# Patient Record
Sex: Male | Born: 1950 | Race: White | Hispanic: No | Marital: Single | Smoking: Never smoker
Health system: Southern US, Community
[De-identification: ages and names within clinical notes are randomized; demographics above are authoritative.]

## PROBLEM LIST (undated history)

## (undated) DIAGNOSIS — N529 Male erectile dysfunction, unspecified: Secondary | ICD-10-CM

## (undated) DIAGNOSIS — E039 Hypothyroidism, unspecified: Secondary | ICD-10-CM

## (undated) DIAGNOSIS — E119 Type 2 diabetes mellitus without complications: Secondary | ICD-10-CM

## (undated) DIAGNOSIS — I509 Heart failure, unspecified: Secondary | ICD-10-CM

## (undated) DIAGNOSIS — G629 Polyneuropathy, unspecified: Secondary | ICD-10-CM

## (undated) DIAGNOSIS — F32A Depression, unspecified: Secondary | ICD-10-CM

## (undated) DIAGNOSIS — I739 Peripheral vascular disease, unspecified: Secondary | ICD-10-CM

## (undated) DIAGNOSIS — F419 Anxiety disorder, unspecified: Secondary | ICD-10-CM

## (undated) DIAGNOSIS — E291 Testicular hypofunction: Secondary | ICD-10-CM

## (undated) DIAGNOSIS — K219 Gastro-esophageal reflux disease without esophagitis: Secondary | ICD-10-CM

## (undated) DIAGNOSIS — Z8659 Personal history of other mental and behavioral disorders: Secondary | ICD-10-CM

## (undated) DIAGNOSIS — F329 Major depressive disorder, single episode, unspecified: Secondary | ICD-10-CM

## (undated) DIAGNOSIS — R748 Abnormal levels of other serum enzymes: Secondary | ICD-10-CM

## (undated) DIAGNOSIS — I10 Essential (primary) hypertension: Secondary | ICD-10-CM

## (undated) DIAGNOSIS — G4700 Insomnia, unspecified: Secondary | ICD-10-CM

## (undated) DIAGNOSIS — F319 Bipolar disorder, unspecified: Secondary | ICD-10-CM

## (undated) DIAGNOSIS — E079 Disorder of thyroid, unspecified: Secondary | ICD-10-CM

## (undated) DIAGNOSIS — F39 Unspecified mood [affective] disorder: Secondary | ICD-10-CM

## (undated) DIAGNOSIS — I472 Ventricular tachycardia, unspecified: Secondary | ICD-10-CM

## (undated) DIAGNOSIS — G4733 Obstructive sleep apnea (adult) (pediatric): Secondary | ICD-10-CM

## (undated) DIAGNOSIS — N400 Enlarged prostate without lower urinary tract symptoms: Secondary | ICD-10-CM

## (undated) DIAGNOSIS — E669 Obesity, unspecified: Secondary | ICD-10-CM

## (undated) DIAGNOSIS — I2510 Atherosclerotic heart disease of native coronary artery without angina pectoris: Secondary | ICD-10-CM

## (undated) DIAGNOSIS — M109 Gout, unspecified: Secondary | ICD-10-CM

## (undated) DIAGNOSIS — Z794 Long term (current) use of insulin: Secondary | ICD-10-CM

## (undated) HISTORY — PX: OTHER SURGICAL HISTORY: SHX169

## (undated) HISTORY — DX: Polyneuropathy, unspecified: G62.9

## (undated) HISTORY — DX: Depression, unspecified: F32.A

## (undated) HISTORY — DX: Insomnia, unspecified: G47.00

## (undated) HISTORY — PX: MASTOIDECTOMY: SHX711

## (undated) HISTORY — DX: Personal history of other mental and behavioral disorders: Z86.59

## (undated) HISTORY — DX: Obesity, unspecified: E66.9

## (undated) HISTORY — DX: Benign prostatic hyperplasia without lower urinary tract symptoms: N40.0

## (undated) HISTORY — DX: Male erectile dysfunction, unspecified: N52.9

## (undated) HISTORY — DX: Bipolar disorder, unspecified: F31.9

## (undated) HISTORY — DX: Type 2 diabetes mellitus without complications: E11.9

## (undated) HISTORY — PX: NASAL SEPTUM SURGERY: SHX37

## (undated) HISTORY — DX: Major depressive disorder, single episode, unspecified: F32.9

## (undated) HISTORY — PX: SCROTOPLASTY: SHX489

## (undated) HISTORY — DX: Peripheral vascular disease, unspecified: I73.9

## (undated) HISTORY — PX: TONSILLECTOMY AND ADENOIDECTOMY: SUR1326

## (undated) HISTORY — DX: Anxiety disorder, unspecified: F41.9

## (undated) HISTORY — DX: Disorder of thyroid, unspecified: E07.9

## (undated) HISTORY — PX: CORONARY ANGIOPLASTY WITH STENT PLACEMENT: SHX49

## (undated) HISTORY — DX: Abnormal levels of other serum enzymes: R74.8

## (undated) HISTORY — DX: Gastro-esophageal reflux disease without esophagitis: K21.9

## (undated) HISTORY — DX: Testicular hypofunction: E29.1

## (undated) HISTORY — PX: PENILE PROSTHESIS IMPLANT: SHX240

## (undated) HISTORY — PX: FOOT SURGERY: SHX648

---

## 2003-02-18 ENCOUNTER — Other Ambulatory Visit: Payer: Self-pay

## 2004-04-05 ENCOUNTER — Observation Stay: Payer: Self-pay

## 2004-04-05 ENCOUNTER — Other Ambulatory Visit: Payer: Self-pay

## 2004-09-30 ENCOUNTER — Other Ambulatory Visit: Payer: Self-pay

## 2004-09-30 ENCOUNTER — Inpatient Hospital Stay: Payer: Self-pay | Admitting: Cardiology

## 2004-10-01 ENCOUNTER — Other Ambulatory Visit: Payer: Self-pay

## 2004-12-23 ENCOUNTER — Other Ambulatory Visit: Payer: Self-pay

## 2004-12-23 ENCOUNTER — Inpatient Hospital Stay: Payer: Self-pay | Admitting: Cardiology

## 2005-02-04 ENCOUNTER — Emergency Department: Payer: Self-pay | Admitting: Emergency Medicine

## 2005-09-06 ENCOUNTER — Emergency Department: Payer: Self-pay | Admitting: Emergency Medicine

## 2005-11-23 ENCOUNTER — Other Ambulatory Visit: Payer: Self-pay

## 2005-11-23 ENCOUNTER — Emergency Department: Payer: Self-pay | Admitting: Emergency Medicine

## 2005-11-28 ENCOUNTER — Other Ambulatory Visit: Payer: Self-pay

## 2005-11-28 ENCOUNTER — Emergency Department: Payer: Self-pay | Admitting: Internal Medicine

## 2005-12-11 ENCOUNTER — Inpatient Hospital Stay: Payer: Self-pay | Admitting: Internal Medicine

## 2005-12-11 ENCOUNTER — Other Ambulatory Visit: Payer: Self-pay

## 2006-01-09 ENCOUNTER — Emergency Department: Payer: Self-pay | Admitting: Emergency Medicine

## 2006-02-03 ENCOUNTER — Emergency Department: Payer: Self-pay

## 2006-02-12 ENCOUNTER — Emergency Department: Payer: Self-pay | Admitting: Emergency Medicine

## 2006-03-07 ENCOUNTER — Other Ambulatory Visit: Payer: Self-pay

## 2006-03-07 ENCOUNTER — Inpatient Hospital Stay: Payer: Self-pay | Admitting: Internal Medicine

## 2006-03-30 ENCOUNTER — Emergency Department: Payer: Self-pay | Admitting: Emergency Medicine

## 2006-04-05 ENCOUNTER — Emergency Department: Payer: Self-pay | Admitting: General Practice

## 2006-05-06 ENCOUNTER — Emergency Department: Payer: Self-pay | Admitting: Unknown Physician Specialty

## 2006-06-16 ENCOUNTER — Other Ambulatory Visit: Payer: Self-pay

## 2006-06-16 ENCOUNTER — Inpatient Hospital Stay: Payer: Self-pay | Admitting: Internal Medicine

## 2006-06-17 ENCOUNTER — Other Ambulatory Visit: Payer: Self-pay

## 2006-06-19 ENCOUNTER — Inpatient Hospital Stay (HOSPITAL_COMMUNITY): Admission: AD | Admit: 2006-06-19 | Discharge: 2006-06-22 | Payer: Self-pay | Admitting: Psychiatry

## 2006-06-20 ENCOUNTER — Ambulatory Visit: Payer: Self-pay | Admitting: Psychiatry

## 2006-06-27 ENCOUNTER — Emergency Department: Payer: Self-pay | Admitting: Emergency Medicine

## 2006-06-27 ENCOUNTER — Other Ambulatory Visit: Payer: Self-pay

## 2006-07-18 ENCOUNTER — Emergency Department: Payer: Self-pay | Admitting: Emergency Medicine

## 2006-07-30 ENCOUNTER — Other Ambulatory Visit: Payer: Self-pay

## 2006-07-30 ENCOUNTER — Inpatient Hospital Stay: Payer: Self-pay | Admitting: *Deleted

## 2006-10-06 ENCOUNTER — Inpatient Hospital Stay: Payer: Self-pay | Admitting: Internal Medicine

## 2006-10-06 ENCOUNTER — Other Ambulatory Visit: Payer: Self-pay

## 2006-10-08 ENCOUNTER — Inpatient Hospital Stay: Payer: Self-pay | Admitting: Unknown Physician Specialty

## 2007-10-11 ENCOUNTER — Emergency Department: Payer: Self-pay | Admitting: Emergency Medicine

## 2007-12-19 ENCOUNTER — Emergency Department: Payer: Self-pay | Admitting: Emergency Medicine

## 2008-02-04 ENCOUNTER — Ambulatory Visit: Payer: Self-pay | Admitting: Nephrology

## 2008-02-11 ENCOUNTER — Ambulatory Visit: Payer: Self-pay | Admitting: Podiatry

## 2008-08-21 ENCOUNTER — Emergency Department: Payer: Self-pay | Admitting: Unknown Physician Specialty

## 2008-09-23 ENCOUNTER — Inpatient Hospital Stay: Payer: Self-pay | Admitting: Internal Medicine

## 2008-09-24 ENCOUNTER — Inpatient Hospital Stay: Payer: Self-pay | Admitting: Psychiatry

## 2008-12-30 ENCOUNTER — Ambulatory Visit: Payer: Self-pay | Admitting: Internal Medicine

## 2009-01-31 ENCOUNTER — Observation Stay: Payer: Self-pay | Admitting: Internal Medicine

## 2009-02-24 ENCOUNTER — Ambulatory Visit: Payer: Self-pay | Admitting: Unknown Physician Specialty

## 2009-03-02 ENCOUNTER — Ambulatory Visit: Payer: Self-pay | Admitting: Internal Medicine

## 2009-03-17 ENCOUNTER — Ambulatory Visit: Payer: Self-pay | Admitting: Internal Medicine

## 2009-08-25 ENCOUNTER — Emergency Department: Payer: Self-pay

## 2009-12-17 ENCOUNTER — Inpatient Hospital Stay: Payer: Self-pay | Admitting: Psychiatry

## 2009-12-17 ENCOUNTER — Inpatient Hospital Stay: Payer: Self-pay | Admitting: Internal Medicine

## 2010-05-18 ENCOUNTER — Emergency Department: Payer: Self-pay | Admitting: Emergency Medicine

## 2012-01-12 ENCOUNTER — Emergency Department: Payer: Self-pay | Admitting: Emergency Medicine

## 2012-01-12 LAB — PROTIME-INR: INR: 0.9

## 2012-01-12 LAB — CBC WITH DIFFERENTIAL/PLATELET
Basophil #: 0.1 10*3/uL (ref 0.0–0.1)
HCT: 41.4 % (ref 40.0–52.0)
HGB: 13.9 g/dL (ref 13.0–18.0)
Lymphocyte #: 2.6 10*3/uL (ref 1.0–3.6)
Lymphocyte %: 34.4 %
MCHC: 33.7 g/dL (ref 32.0–36.0)
Monocyte #: 1 x10 3/mm (ref 0.2–1.0)
Monocyte %: 13.1 %
Neutrophil %: 47.6 %
Platelet: 190 10*3/uL (ref 150–440)
RDW: 14.9 % — ABNORMAL HIGH (ref 11.5–14.5)
WBC: 7.5 10*3/uL (ref 3.8–10.6)

## 2012-01-12 LAB — COMPREHENSIVE METABOLIC PANEL
Alkaline Phosphatase: 85 U/L (ref 50–136)
Anion Gap: 11 (ref 7–16)
Bilirubin,Total: 0.5 mg/dL (ref 0.2–1.0)
Chloride: 101 mmol/L (ref 98–107)
Creatinine: 1.16 mg/dL (ref 0.60–1.30)
EGFR (African American): >60
Glucose: 108 mg/dL — ABNORMAL HIGH (ref 65–99)
Osmolality: 284 (ref 275–301)
Potassium: 3.9 mmol/L (ref 3.5–5.1)
SGOT(AST): 29 U/L (ref 15–37)
Sodium: 140 mmol/L (ref 136–145)
Total Protein: 7.8 g/dL (ref 6.4–8.2)

## 2012-01-12 LAB — URINALYSIS, COMPLETE
Bacteria: NONE SEEN
Ketone: NEGATIVE
Nitrite: NEGATIVE
Protein: 30
Specific Gravity: 1.013 (ref 1.003–1.030)
Squamous Epithelial: NONE SEEN

## 2012-01-12 LAB — APTT: Activated PTT: 29.8 secs (ref 23.6–35.9)

## 2012-01-12 LAB — TROPONIN I
Troponin-I: <0.02 ng/mL
Troponin-I: <0.02 ng/mL

## 2012-01-30 ENCOUNTER — Ambulatory Visit: Payer: Self-pay | Admitting: Unknown Physician Specialty

## 2012-06-04 ENCOUNTER — Ambulatory Visit: Payer: Self-pay | Admitting: Physician Assistant

## 2012-06-15 ENCOUNTER — Ambulatory Visit: Payer: Self-pay | Admitting: Physician Assistant

## 2012-07-16 ENCOUNTER — Ambulatory Visit: Payer: Self-pay | Admitting: Physician Assistant

## 2012-07-23 ENCOUNTER — Ambulatory Visit: Payer: Self-pay | Admitting: Urology

## 2012-07-23 LAB — CBC WITH DIFFERENTIAL/PLATELET
Basophil #: 0.1 10*3/uL (ref 0.0–0.1)
HCT: 40.9 % (ref 40.0–52.0)
HGB: 13.5 g/dL (ref 13.0–18.0)
Lymphocyte #: 1.8 10*3/uL (ref 1.0–3.6)
Lymphocyte %: 23.1 %
MCH: 30 pg (ref 26.0–34.0)
Monocyte %: 11.5 %
Neutrophil #: 4.6 10*3/uL (ref 1.4–6.5)
Platelet: 180 10*3/uL (ref 150–440)
RBC: 4.5 10*6/uL (ref 4.40–5.90)
RDW: 16 % — ABNORMAL HIGH (ref 11.5–14.5)

## 2012-07-23 LAB — BASIC METABOLIC PANEL
Anion Gap: 4 — ABNORMAL LOW (ref 7–16)
BUN: 18 mg/dL (ref 7–18)
Chloride: 104 mmol/L (ref 98–107)
Creatinine: 1.11 mg/dL (ref 0.60–1.30)
EGFR (African American): >60
EGFR (Non-African Amer.): >60
Glucose: 189 mg/dL — ABNORMAL HIGH (ref 65–99)
Osmolality: 284 (ref 275–301)
Potassium: 4.7 mmol/L (ref 3.5–5.1)
Sodium: 139 mmol/L (ref 136–145)

## 2012-07-29 ENCOUNTER — Ambulatory Visit: Payer: Self-pay | Admitting: Urology

## 2012-11-19 ENCOUNTER — Emergency Department: Payer: Self-pay | Admitting: Emergency Medicine

## 2012-11-19 LAB — CBC
HCT: 40.3 % (ref 40.0–52.0)
HGB: 13.5 g/dL (ref 13.0–18.0)
MCH: 29.8 pg (ref 26.0–34.0)
MCHC: 33.5 g/dL (ref 32.0–36.0)
MCV: 89 fL (ref 80–100)
RBC: 4.54 10*6/uL (ref 4.40–5.90)
RDW: 15.4 % — ABNORMAL HIGH (ref 11.5–14.5)

## 2012-11-19 LAB — URINALYSIS, COMPLETE
Bacteria: NONE SEEN
Bilirubin,UR: NEGATIVE
Glucose,UR: >=500 mg/dL (ref 0–75)
Protein: NEGATIVE
RBC,UR: 1 /HPF (ref 0–5)
Specific Gravity: 1.021 (ref 1.003–1.030)
Squamous Epithelial: NONE SEEN
WBC UR: <1 /HPF (ref 0–5)

## 2012-11-19 LAB — COMPREHENSIVE METABOLIC PANEL
Albumin: 3 g/dL — ABNORMAL LOW (ref 3.4–5.0)
Alkaline Phosphatase: 119 U/L (ref 50–136)
Anion Gap: 6 — ABNORMAL LOW (ref 7–16)
Bilirubin,Total: 0.4 mg/dL (ref 0.2–1.0)
Calcium, Total: 8.9 mg/dL (ref 8.5–10.1)
Co2: 28 mmol/L (ref 21–32)
Creatinine: 1.2 mg/dL (ref 0.60–1.30)
Glucose: 461 mg/dL — ABNORMAL HIGH (ref 65–99)
Osmolality: 283 (ref 275–301)
Potassium: 4.8 mmol/L (ref 3.5–5.1)
SGOT(AST): 25 U/L (ref 15–37)
SGPT (ALT): 33 U/L (ref 12–78)
Sodium: 129 mmol/L — ABNORMAL LOW (ref 136–145)
Total Protein: 7.5 g/dL (ref 6.4–8.2)

## 2012-11-21 ENCOUNTER — Ambulatory Visit: Payer: Self-pay | Admitting: Physician Assistant

## 2012-11-24 ENCOUNTER — Observation Stay: Payer: Self-pay | Admitting: Internal Medicine

## 2012-11-24 LAB — BASIC METABOLIC PANEL
Anion Gap: 7 (ref 7–16)
Calcium, Total: 8.4 mg/dL — ABNORMAL LOW (ref 8.5–10.1)
Chloride: 101 mmol/L (ref 98–107)
Co2: 27 mmol/L (ref 21–32)
Creatinine: 1.26 mg/dL (ref 0.60–1.30)
EGFR (African American): >60
EGFR (Non-African Amer.): >60
Osmolality: 291 (ref 275–301)
Sodium: 135 mmol/L — ABNORMAL LOW (ref 136–145)

## 2012-11-24 LAB — CBC
HCT: 37.9 % — ABNORMAL LOW (ref 40.0–52.0)
HGB: 12.7 g/dL — ABNORMAL LOW (ref 13.0–18.0)
MCHC: 33.5 g/dL (ref 32.0–36.0)
Platelet: 165 10*3/uL (ref 150–440)
WBC: 7.5 10*3/uL (ref 3.8–10.6)

## 2012-11-24 LAB — CK TOTAL AND CKMB (NOT AT ARMC)
CK, Total: 597 U/L — ABNORMAL HIGH (ref 35–232)
CK-MB: 21.6 ng/mL — ABNORMAL HIGH (ref 0.5–3.6)

## 2012-11-24 LAB — TROPONIN I
Troponin-I: 0.04 ng/mL
Troponin-I: 0.04 ng/mL

## 2012-12-03 ENCOUNTER — Ambulatory Visit: Payer: Self-pay | Admitting: Internal Medicine

## 2013-01-05 ENCOUNTER — Emergency Department: Payer: Self-pay | Admitting: Emergency Medicine

## 2013-01-05 LAB — CBC
MCH: 32.1 pg (ref 26.0–34.0)
MCHC: 36.3 g/dL — ABNORMAL HIGH (ref 32.0–36.0)
MCV: 88 fL (ref 80–100)
Platelet: 258 10*3/uL (ref 150–440)
RDW: 16.1 % — ABNORMAL HIGH (ref 11.5–14.5)
WBC: 7.2 10*3/uL (ref 3.8–10.6)

## 2013-01-05 LAB — COMPREHENSIVE METABOLIC PANEL
Albumin: 3.3 g/dL — ABNORMAL LOW (ref 3.4–5.0)
Alkaline Phosphatase: 138 U/L — ABNORMAL HIGH (ref 50–136)
Anion Gap: 12 (ref 7–16)
BUN: 28 mg/dL — ABNORMAL HIGH (ref 7–18)
Bilirubin,Total: 0.7 mg/dL (ref 0.2–1.0)
Calcium, Total: 9.5 mg/dL (ref 8.5–10.1)
Chloride: 92 mmol/L — ABNORMAL LOW (ref 98–107)
Creatinine: 1.27 mg/dL (ref 0.60–1.30)
EGFR (African American): >60
Glucose: 560 mg/dL (ref 65–99)
Osmolality: 284 (ref 275–301)
SGPT (ALT): 58 U/L (ref 12–78)
Sodium: 126 mmol/L — ABNORMAL LOW (ref 136–145)
Total Protein: 8 g/dL (ref 6.4–8.2)

## 2013-01-17 ENCOUNTER — Emergency Department: Payer: Self-pay | Admitting: Emergency Medicine

## 2013-01-17 LAB — CBC
HCT: 38.8 % — ABNORMAL LOW (ref 40.0–52.0)
HGB: 13.5 g/dL (ref 13.0–18.0)
MCH: 30.7 pg (ref 26.0–34.0)
MCHC: 34.8 g/dL (ref 32.0–36.0)
MCV: 88 fL (ref 80–100)
Platelet: 189 10*3/uL (ref 150–440)
RBC: 4.4 10*6/uL (ref 4.40–5.90)

## 2013-01-17 LAB — BASIC METABOLIC PANEL
Anion Gap: 8 (ref 7–16)
BUN: 22 mg/dL — ABNORMAL HIGH (ref 7–18)
Calcium, Total: 8.7 mg/dL (ref 8.5–10.1)
Chloride: 99 mmol/L (ref 98–107)
Co2: 26 mmol/L (ref 21–32)
Creatinine: 1.22 mg/dL (ref 0.60–1.30)
EGFR (African American): >60
EGFR (Non-African Amer.): >60
Glucose: 302 mg/dL — ABNORMAL HIGH (ref 65–99)
Osmolality: 281 (ref 275–301)
Potassium: 5 mmol/L (ref 3.5–5.1)
Sodium: 133 mmol/L — ABNORMAL LOW (ref 136–145)

## 2013-01-17 LAB — APTT: Activated PTT: 29.9 secs (ref 23.6–35.9)

## 2013-01-17 LAB — PROTIME-INR: Prothrombin Time: 12.6 secs (ref 11.5–14.7)

## 2013-01-17 LAB — TROPONIN I: Troponin-I: <0.02 ng/mL

## 2013-07-10 ENCOUNTER — Inpatient Hospital Stay: Payer: Self-pay | Admitting: Internal Medicine

## 2013-07-10 LAB — URINALYSIS, COMPLETE
BILIRUBIN, UR: NEGATIVE
Bacteria: NONE SEEN
KETONE: NEGATIVE
Leukocyte Esterase: NEGATIVE
Nitrite: NEGATIVE
PH: 6 (ref 4.5–8.0)
RBC,UR: 1 /HPF (ref 0–5)
SQUAMOUS EPITHELIAL: NONE SEEN
Specific Gravity: 1.008 (ref 1.003–1.030)
WBC UR: NONE SEEN /HPF (ref 0–5)

## 2013-07-10 LAB — DRUG SCREEN, URINE

## 2013-07-10 LAB — VALPROIC ACID LEVEL: VALPROIC ACID: 62 ug/mL

## 2013-07-10 LAB — COMPREHENSIVE METABOLIC PANEL
ALK PHOS: 72 U/L
ALT: 71 U/L (ref 12–78)
AST: 71 U/L — AB (ref 15–37)
Albumin: 3.7 g/dL (ref 3.4–5.0)
Anion Gap: 6 — ABNORMAL LOW (ref 7–16)
BILIRUBIN TOTAL: 0.6 mg/dL (ref 0.2–1.0)
BUN: 22 mg/dL — AB (ref 7–18)
CHLORIDE: 101 mmol/L (ref 98–107)
CO2: 29 mmol/L (ref 21–32)
Calcium, Total: 8.9 mg/dL (ref 8.5–10.1)
Creatinine: 1.34 mg/dL — ABNORMAL HIGH (ref 0.60–1.30)
EGFR (African American): >60
EGFR (Non-African Amer.): 57 — ABNORMAL LOW
Glucose: 56 mg/dL — ABNORMAL LOW (ref 65–99)
Osmolality: 273 (ref 275–301)
POTASSIUM: 3.5 mmol/L (ref 3.5–5.1)
SODIUM: 136 mmol/L (ref 136–145)
TOTAL PROTEIN: 8.2 g/dL (ref 6.4–8.2)

## 2013-07-10 LAB — CBC
HCT: 43.3 % (ref 40.0–52.0)
HGB: 14.7 g/dL (ref 13.0–18.0)
MCH: 30.9 pg (ref 26.0–34.0)
MCHC: 33.9 g/dL (ref 32.0–36.0)
MCV: 91 fL (ref 80–100)
Platelet: 222 10*3/uL (ref 150–440)
RBC: 4.75 10*6/uL (ref 4.40–5.90)
RDW: 16.1 % — ABNORMAL HIGH (ref 11.5–14.5)
WBC: 9.5 10*3/uL (ref 3.8–10.6)

## 2013-07-10 LAB — ETHANOL
Ethanol %: <0.003 % (ref 0.000–0.080)
Ethanol: <3 mg/dL

## 2013-07-10 LAB — SALICYLATE LEVEL

## 2013-07-10 LAB — ACETAMINOPHEN LEVEL: Acetaminophen: <2 ug/mL

## 2013-07-11 ENCOUNTER — Inpatient Hospital Stay: Payer: Self-pay | Admitting: Psychiatry

## 2013-07-11 LAB — CBC WITH DIFFERENTIAL/PLATELET
BASOS ABS: 0.1 10*3/uL (ref 0.0–0.1)
BASOS PCT: 0.6 %
Eosinophil #: 0.5 10*3/uL (ref 0.0–0.7)
Eosinophil %: 5.4 %
HCT: 46.2 % (ref 40.0–52.0)
HGB: 15.1 g/dL (ref 13.0–18.0)
Lymphocyte #: 2.7 10*3/uL (ref 1.0–3.6)
Lymphocyte %: 27.4 %
MCH: 30 pg (ref 26.0–34.0)
MCHC: 32.8 g/dL (ref 32.0–36.0)
MCV: 91 fL (ref 80–100)
MONO ABS: 1.1 x10 3/mm — AB (ref 0.2–1.0)
Monocyte %: 10.7 %
Neutrophil #: 5.5 10*3/uL (ref 1.4–6.5)
Neutrophil %: 55.9 %
PLATELETS: 178 10*3/uL (ref 150–440)
RBC: 5.05 10*6/uL (ref 4.40–5.90)
RDW: 16.1 % — ABNORMAL HIGH (ref 11.5–14.5)
WBC: 9.9 10*3/uL (ref 3.8–10.6)

## 2013-07-11 LAB — BASIC METABOLIC PANEL
ANION GAP: 5 — AB (ref 7–16)
BUN: 23 mg/dL — AB (ref 7–18)
CHLORIDE: 102 mmol/L (ref 98–107)
Calcium, Total: 9.2 mg/dL (ref 8.5–10.1)
Co2: 27 mmol/L (ref 21–32)
Creatinine: 1.23 mg/dL (ref 0.60–1.30)
EGFR (Non-African Amer.): >60
Glucose: 157 mg/dL — ABNORMAL HIGH (ref 65–99)
Osmolality: 275 (ref 275–301)
POTASSIUM: 4 mmol/L (ref 3.5–5.1)
SODIUM: 134 mmol/L — AB (ref 136–145)

## 2013-08-13 LAB — BASIC METABOLIC PANEL
Anion Gap: 10 (ref 7–16)
BUN: 21 mg/dL — ABNORMAL HIGH (ref 7–18)
CHLORIDE: 102 mmol/L (ref 98–107)
CO2: 23 mmol/L (ref 21–32)
Calcium, Total: 9.5 mg/dL (ref 8.5–10.1)
Creatinine: 1.52 mg/dL — ABNORMAL HIGH (ref 0.60–1.30)
EGFR (African American): 57 — ABNORMAL LOW
EGFR (Non-African Amer.): 49 — ABNORMAL LOW
Glucose: 204 mg/dL — ABNORMAL HIGH (ref 65–99)
OSMOLALITY: 279 (ref 275–301)
Potassium: 4.2 mmol/L (ref 3.5–5.1)
Sodium: 135 mmol/L — ABNORMAL LOW (ref 136–145)

## 2013-08-13 LAB — CBC
HCT: 45.1 % (ref 40.0–52.0)
HGB: 14.4 g/dL (ref 13.0–18.0)
MCH: 29.4 pg (ref 26.0–34.0)
MCHC: 31.8 g/dL — ABNORMAL LOW (ref 32.0–36.0)
MCV: 92 fL (ref 80–100)
Platelet: 210 10*3/uL (ref 150–440)
RBC: 4.88 10*6/uL (ref 4.40–5.90)
RDW: 16.4 % — AB (ref 11.5–14.5)
WBC: 7.4 10*3/uL (ref 3.8–10.6)

## 2013-08-13 LAB — TROPONIN I
TROPONIN-I: 0.39 ng/mL — AB
Troponin-I: 0.03 ng/mL

## 2013-08-13 LAB — CK-MB: CK-MB: 19.2 ng/mL — AB (ref 0.5–3.6)

## 2013-08-14 ENCOUNTER — Inpatient Hospital Stay: Payer: Self-pay | Admitting: Internal Medicine

## 2013-08-14 LAB — BASIC METABOLIC PANEL
ANION GAP: 10 (ref 7–16)
BUN: 24 mg/dL — ABNORMAL HIGH (ref 7–18)
CO2: 24 mmol/L (ref 21–32)
CREATININE: 1.4 mg/dL — AB (ref 0.60–1.30)
Calcium, Total: 9.2 mg/dL (ref 8.5–10.1)
Chloride: 106 mmol/L (ref 98–107)
EGFR (African American): >60
EGFR (Non-African Amer.): 54 — ABNORMAL LOW
Glucose: 188 mg/dL — ABNORMAL HIGH (ref 65–99)
OSMOLALITY: 288 (ref 275–301)
Potassium: 4.1 mmol/L (ref 3.5–5.1)
Sodium: 140 mmol/L (ref 136–145)

## 2013-08-14 LAB — TROPONIN I: TROPONIN-I: 0.6 ng/mL — AB

## 2013-08-14 LAB — CBC WITH DIFFERENTIAL/PLATELET
Basophil #: 0.1 10*3/uL (ref 0.0–0.1)
Basophil %: 0.8 %
EOS ABS: 0.4 10*3/uL (ref 0.0–0.7)
Eosinophil %: 5.5 %
HCT: 40.9 % (ref 40.0–52.0)
HGB: 13.2 g/dL (ref 13.0–18.0)
LYMPHS ABS: 2.5 10*3/uL (ref 1.0–3.6)
LYMPHS PCT: 33.9 %
MCH: 29.6 pg (ref 26.0–34.0)
MCHC: 32.3 g/dL (ref 32.0–36.0)
MCV: 91 fL (ref 80–100)
MONOS PCT: 13 %
Monocyte #: 1 x10 3/mm (ref 0.2–1.0)
NEUTROS ABS: 3.4 10*3/uL (ref 1.4–6.5)
Neutrophil %: 46.8 %
Platelet: 209 10*3/uL (ref 150–440)
RBC: 4.47 10*6/uL (ref 4.40–5.90)
RDW: 16.3 % — AB (ref 11.5–14.5)
WBC: 7.4 10*3/uL (ref 3.8–10.6)

## 2013-08-14 LAB — LIPID PANEL
Cholesterol: 211 mg/dL — ABNORMAL HIGH (ref 0–200)
HDL: 24 mg/dL — AB (ref 40–60)
Triglycerides: 530 mg/dL — ABNORMAL HIGH (ref 0–200)

## 2013-08-14 LAB — PROTIME-INR
INR: 1.1
PROTHROMBIN TIME: 13.6 s (ref 11.5–14.7)

## 2013-08-14 LAB — CK-MB
CK-MB: 15.9 ng/mL — ABNORMAL HIGH (ref 0.5–3.6)
CK-MB: 14.2 ng/mL — ABNORMAL HIGH (ref 0.5–3.6)

## 2013-08-15 LAB — CK TOTAL AND CKMB (NOT AT ARMC)
CK, TOTAL: 249 U/L
CK-MB: 7.8 ng/mL — ABNORMAL HIGH (ref 0.5–3.6)

## 2013-08-15 LAB — BASIC METABOLIC PANEL
Anion Gap: 10 (ref 7–16)
BUN: 17 mg/dL (ref 7–18)
CHLORIDE: 103 mmol/L (ref 98–107)
Calcium, Total: 8.8 mg/dL (ref 8.5–10.1)
Co2: 25 mmol/L (ref 21–32)
Creatinine: 0.92 mg/dL (ref 0.60–1.30)
EGFR (Non-African Amer.): >60
Glucose: 173 mg/dL — ABNORMAL HIGH (ref 65–99)
OSMOLALITY: 281 (ref 275–301)
Potassium: 4 mmol/L (ref 3.5–5.1)
Sodium: 138 mmol/L (ref 136–145)

## 2013-09-09 ENCOUNTER — Emergency Department: Payer: Self-pay | Admitting: Emergency Medicine

## 2013-09-26 DIAGNOSIS — I214 Non-ST elevation (NSTEMI) myocardial infarction: Secondary | ICD-10-CM | POA: Insufficient documentation

## 2013-09-26 DIAGNOSIS — E119 Type 2 diabetes mellitus without complications: Secondary | ICD-10-CM | POA: Insufficient documentation

## 2013-09-26 DIAGNOSIS — I2510 Atherosclerotic heart disease of native coronary artery without angina pectoris: Secondary | ICD-10-CM | POA: Insufficient documentation

## 2013-09-29 ENCOUNTER — Inpatient Hospital Stay: Payer: Self-pay | Admitting: Internal Medicine

## 2013-09-29 LAB — URINALYSIS, COMPLETE
BACTERIA: NONE SEEN
BILIRUBIN, UR: NEGATIVE
Glucose,UR: >=500 mg/dL (ref 0–75)
Ketone: NEGATIVE
Leukocyte Esterase: NEGATIVE
Nitrite: NEGATIVE
Ph: 6 (ref 4.5–8.0)
Specific Gravity: 1.018 (ref 1.003–1.030)
Squamous Epithelial: <1
WBC UR: NONE SEEN /HPF (ref 0–5)

## 2013-09-29 LAB — COMPREHENSIVE METABOLIC PANEL
ALBUMIN: 3.3 g/dL — AB (ref 3.4–5.0)
ALK PHOS: 99 U/L
ALT: 43 U/L (ref 12–78)
Anion Gap: 7 (ref 7–16)
BILIRUBIN TOTAL: 0.5 mg/dL (ref 0.2–1.0)
BUN: 31 mg/dL — ABNORMAL HIGH (ref 7–18)
CALCIUM: 9.2 mg/dL (ref 8.5–10.1)
Chloride: 94 mmol/L — ABNORMAL LOW (ref 98–107)
Co2: 28 mmol/L (ref 21–32)
Creatinine: 1.35 mg/dL — ABNORMAL HIGH (ref 0.60–1.30)
EGFR (African American): >60
EGFR (Non-African Amer.): 57 — ABNORMAL LOW
Glucose: 346 mg/dL — ABNORMAL HIGH (ref 65–99)
Osmolality: 279 (ref 275–301)
Potassium: 4.7 mmol/L (ref 3.5–5.1)
SGOT(AST): 36 U/L (ref 15–37)
Sodium: 129 mmol/L — ABNORMAL LOW (ref 136–145)
TOTAL PROTEIN: 7.9 g/dL (ref 6.4–8.2)

## 2013-09-29 LAB — CBC
HCT: 41.9 % (ref 40.0–52.0)
HGB: 13.4 g/dL (ref 13.0–18.0)
MCH: 29.4 pg (ref 26.0–34.0)
MCHC: 32 g/dL (ref 32.0–36.0)
MCV: 92 fL (ref 80–100)
Platelet: 219 10*3/uL (ref 150–440)
RBC: 4.57 10*6/uL (ref 4.40–5.90)
RDW: 15.7 % — ABNORMAL HIGH (ref 11.5–14.5)
WBC: 12.8 10*3/uL — AB (ref 3.8–10.6)

## 2013-09-29 LAB — LIPID PANEL
Cholesterol: 299 mg/dL — ABNORMAL HIGH (ref 0–200)
HDL Cholesterol: 26 mg/dL — ABNORMAL LOW (ref 40–60)
Triglycerides: 917 mg/dL — ABNORMAL HIGH (ref 0–200)

## 2013-09-29 LAB — LIPASE, BLOOD: Lipase: 694 U/L — ABNORMAL HIGH (ref 73–393)

## 2013-09-30 LAB — BASIC METABOLIC PANEL
Anion Gap: 3 — ABNORMAL LOW (ref 7–16)
BUN: 18 mg/dL (ref 7–18)
CALCIUM: 8.8 mg/dL (ref 8.5–10.1)
CHLORIDE: 95 mmol/L — AB (ref 98–107)
Co2: 32 mmol/L (ref 21–32)
Creatinine: 1.14 mg/dL (ref 0.60–1.30)
GLUCOSE: 185 mg/dL — AB (ref 65–99)
Osmolality: 268 (ref 275–301)
Potassium: 4.1 mmol/L (ref 3.5–5.1)
Sodium: 130 mmol/L — ABNORMAL LOW (ref 136–145)

## 2013-09-30 LAB — CBC WITH DIFFERENTIAL/PLATELET
BASOS ABS: 0.1 10*3/uL (ref 0.0–0.1)
BASOS PCT: 0.5 %
EOS PCT: 0.7 %
Eosinophil #: 0.1 10*3/uL (ref 0.0–0.7)
HCT: 39.4 % — AB (ref 40.0–52.0)
HGB: 13 g/dL (ref 13.0–18.0)
LYMPHS PCT: 14 %
Lymphocyte #: 1.9 10*3/uL (ref 1.0–3.6)
MCH: 29.9 pg (ref 26.0–34.0)
MCHC: 32.9 g/dL (ref 32.0–36.0)
MCV: 91 fL (ref 80–100)
MONO ABS: 1.9 x10 3/mm — AB (ref 0.2–1.0)
Monocyte %: 14.2 %
Neutrophil #: 9.7 10*3/uL — ABNORMAL HIGH (ref 1.4–6.5)
Neutrophil %: 70.6 %
Platelet: 189 10*3/uL (ref 150–440)
RBC: 4.34 10*6/uL — AB (ref 4.40–5.90)
RDW: 15.7 % — ABNORMAL HIGH (ref 11.5–14.5)
WBC: 13.7 10*3/uL — AB (ref 3.8–10.6)

## 2013-09-30 LAB — MAGNESIUM: Magnesium: 1.8 mg/dL

## 2013-09-30 LAB — LIPASE, BLOOD: Lipase: 529 U/L — ABNORMAL HIGH (ref 73–393)

## 2013-10-01 LAB — BASIC METABOLIC PANEL
Anion Gap: 6 — ABNORMAL LOW (ref 7–16)
BUN: 14 mg/dL (ref 7–18)
CALCIUM: 9.6 mg/dL (ref 8.5–10.1)
CHLORIDE: 98 mmol/L (ref 98–107)
Co2: 29 mmol/L (ref 21–32)
Creatinine: 0.92 mg/dL (ref 0.60–1.30)
GLUCOSE: 173 mg/dL — AB (ref 65–99)
OSMOLALITY: 271 (ref 275–301)
POTASSIUM: 4.3 mmol/L (ref 3.5–5.1)
SODIUM: 133 mmol/L — AB (ref 136–145)

## 2013-10-01 LAB — LIPASE, BLOOD: Lipase: 2055 U/L — ABNORMAL HIGH (ref 73–393)

## 2013-11-18 DIAGNOSIS — K859 Acute pancreatitis without necrosis or infection, unspecified: Secondary | ICD-10-CM | POA: Insufficient documentation

## 2013-11-18 DIAGNOSIS — E782 Mixed hyperlipidemia: Secondary | ICD-10-CM | POA: Insufficient documentation

## 2013-11-28 ENCOUNTER — Ambulatory Visit: Payer: Self-pay | Admitting: Gastroenterology

## 2013-11-28 LAB — CREATININE, SERUM
Creatinine: 1.19 mg/dL (ref 0.60–1.30)
EGFR (African American): >60
EGFR (Non-African Amer.): >60

## 2013-12-23 DIAGNOSIS — E1149 Type 2 diabetes mellitus with other diabetic neurological complication: Secondary | ICD-10-CM | POA: Insufficient documentation

## 2013-12-23 DIAGNOSIS — E1159 Type 2 diabetes mellitus with other circulatory complications: Secondary | ICD-10-CM

## 2013-12-23 DIAGNOSIS — Z794 Long term (current) use of insulin: Secondary | ICD-10-CM | POA: Insufficient documentation

## 2013-12-23 DIAGNOSIS — I10 Essential (primary) hypertension: Secondary | ICD-10-CM

## 2013-12-23 DIAGNOSIS — E889 Metabolic disorder, unspecified: Secondary | ICD-10-CM | POA: Insufficient documentation

## 2013-12-23 DIAGNOSIS — E1169 Type 2 diabetes mellitus with other specified complication: Secondary | ICD-10-CM

## 2013-12-23 DIAGNOSIS — E669 Obesity, unspecified: Secondary | ICD-10-CM | POA: Insufficient documentation

## 2014-01-06 DIAGNOSIS — E669 Obesity, unspecified: Secondary | ICD-10-CM | POA: Insufficient documentation

## 2014-01-26 ENCOUNTER — Inpatient Hospital Stay: Payer: Self-pay | Admitting: Psychiatry

## 2014-01-26 LAB — COMPREHENSIVE METABOLIC PANEL
ALBUMIN: 3.6 g/dL (ref 3.4–5.0)
ALK PHOS: 97 U/L
Anion Gap: 7 (ref 7–16)
BUN: 20 mg/dL — ABNORMAL HIGH (ref 7–18)
Bilirubin,Total: 0.2 mg/dL (ref 0.2–1.0)
Calcium, Total: 8.7 mg/dL (ref 8.5–10.1)
Chloride: 103 mmol/L (ref 98–107)
Co2: 29 mmol/L (ref 21–32)
Creatinine: 1.36 mg/dL — ABNORMAL HIGH (ref 0.60–1.30)
EGFR (African American): >60
EGFR (Non-African Amer.): 57 — ABNORMAL LOW
Glucose: 123 mg/dL — ABNORMAL HIGH (ref 65–99)
OSMOLALITY: 282 (ref 275–301)
Potassium: 4.7 mmol/L (ref 3.5–5.1)
SGOT(AST): 39 U/L — ABNORMAL HIGH (ref 15–37)
SGPT (ALT): 36 U/L
Sodium: 139 mmol/L (ref 136–145)
TOTAL PROTEIN: 8.6 g/dL — AB (ref 6.4–8.2)

## 2014-01-26 LAB — CBC
HCT: 45.6 % (ref 40.0–52.0)
HGB: 14.7 g/dL (ref 13.0–18.0)
MCH: 30.3 pg (ref 26.0–34.0)
MCHC: 32.1 g/dL (ref 32.0–36.0)
MCV: 94 fL (ref 80–100)
Platelet: 276 10*3/uL (ref 150–440)
RBC: 4.84 10*6/uL (ref 4.40–5.90)
RDW: 16.1 % — ABNORMAL HIGH (ref 11.5–14.5)
WBC: 8.7 10*3/uL (ref 3.8–10.6)

## 2014-01-26 LAB — URINALYSIS, COMPLETE
BILIRUBIN, UR: NEGATIVE
Bacteria: NONE SEEN
Blood: NEGATIVE
Glucose,UR: >=500 mg/dL (ref 0–75)
Ketone: NEGATIVE
LEUKOCYTE ESTERASE: NEGATIVE
Nitrite: NEGATIVE
Ph: 5 (ref 4.5–8.0)
Protein: NEGATIVE
RBC,UR: 2 /HPF (ref 0–5)
SPECIFIC GRAVITY: 1.021 (ref 1.003–1.030)
Squamous Epithelial: NONE SEEN
WBC UR: NONE SEEN /HPF (ref 0–5)

## 2014-01-26 LAB — SALICYLATE LEVEL

## 2014-01-26 LAB — DRUG SCREEN, URINE

## 2014-01-26 LAB — ACETAMINOPHEN LEVEL

## 2014-01-26 LAB — ETHANOL: Ethanol: <3 mg/dL

## 2014-01-29 LAB — LIPID PANEL
CHOLESTEROL: 223 mg/dL — AB (ref 0–200)
HDL Cholesterol: 34 mg/dL — ABNORMAL LOW (ref 40–60)
Triglycerides: 474 mg/dL — ABNORMAL HIGH (ref 0–200)

## 2014-01-29 LAB — HEMOGLOBIN A1C: Hemoglobin A1C: 8.4 % — ABNORMAL HIGH (ref 4.2–6.3)

## 2014-01-29 LAB — TSH: THYROID STIMULATING HORM: 1.67 u[IU]/mL

## 2014-01-29 LAB — VALPROIC ACID LEVEL: Valproic Acid: 65 ug/mL

## 2014-02-03 DIAGNOSIS — E039 Hypothyroidism, unspecified: Secondary | ICD-10-CM | POA: Insufficient documentation

## 2014-02-03 DIAGNOSIS — E119 Type 2 diabetes mellitus without complications: Secondary | ICD-10-CM | POA: Insufficient documentation

## 2014-02-08 ENCOUNTER — Emergency Department: Payer: Self-pay | Admitting: Emergency Medicine

## 2014-06-27 ENCOUNTER — Emergency Department: Payer: Self-pay | Admitting: Emergency Medicine

## 2014-07-07 ENCOUNTER — Other Ambulatory Visit: Payer: Self-pay | Admitting: *Deleted

## 2014-07-07 NOTE — Patient Instructions (Signed)
[place]: [address], [place]  [place]. Depression and Bipolar Support Group No Cost Wednesdays at 7pm Contact: Adele Barthel [phone]  Wellness Management and Recovery -Bipolar disorder, coping skills [facility] Psychotherapy, trauma and Addictions Counseling [place],  [address] [place] [phone] cost $20-$40 dollars

## 2014-07-08 NOTE — Patient Outreach (Signed)
    [facility]  07/08/2014   Dennis Mullen Jul 26, 1950 [id number]     Assessment: Initial home visit with patient.  Patient very pleasant and engaging.  Currently denies any thoughts of harm to himself or others.  Reports actively seeing his psychiatrist, however does not see a therapist regularly due to not being able to afford the co-pay.  Patient currently working with Insurance underwriter on budgeting.  Patient given list of mental health agencies that may offer services on a sliding scale.  Patient also given information for no to low cost group therapy.  Plan: Home visit scheduled for 07/30/14 to follow up on referrals provided.

## 2014-07-30 ENCOUNTER — Other Ambulatory Visit: Payer: Self-pay | Admitting: *Deleted

## 2014-07-30 NOTE — Patient Outreach (Signed)
  [facility]) Care Management  [facility] Social Work  07/30/2014  Dennis Mullen [facility] 27-May-1950 [id number]  Subjective:    Objective:   Current Medications:  No current outpatient prescriptions on file.   No current facility-administered medications for this visit.    Functional Status:  In your present state of health, do you have any difficulty performing the following activities: 07/30/2014  Hearing? N  Vision? N  Walking or climbing stairs? N  Dressing or bathing? N  Doing errands, shopping? N  Preparing Food and eating ? Y  Using the Toilet? Y  In the past six months, have you accidently leaked urine? N  Do you have problems with loss of bowel control? Y  Managing your Medications? N  Managing your Finances? N  Housekeeping or managing your Housekeeping? N    Fall/Depression Screening:  PHQ 2/9 Scores 07/30/2014  PHQ - 2 Score 0    Assessment:  Home visit to patient's home to follow up on mental health referrals provided.  Patient reports that his mood is stable. Symptoms of  depression and anxiety minimal. No current suicidal or homicidal ideations. However patient discussed increased frustration  with an issue regarding a small loan he was trying to obtain online . Per patient, the company requesting over $200.00 to be sent to them before the loan would be funded.   Warnings and dangers of fraud discussed .   Per patient he has not looked in to mental health referrals previously provided stating that around this time of year his mood is manageable. Patient  agreeable to referral to [facility] behavioral health.  Continues to be actively  involved in church, reports that sending  out inspirational text messages  to church members keeps him focused and grounded.     Plan:  This Education officer, museum to refer patient to the [facility]. This social worker to follow up with patient by phone next month.     [name] [facility] Care  Management [phone]

## 2014-08-04 ENCOUNTER — Other Ambulatory Visit: Payer: Self-pay | Admitting: *Deleted

## 2014-08-04 VITALS — BP 110/72 | HR 80 | Resp 16 | Ht 74.0 in | Wt 285.0 lb

## 2014-08-04 DIAGNOSIS — E1169 Type 2 diabetes mellitus with other specified complication: Secondary | ICD-10-CM

## 2014-08-06 NOTE — Patient Outreach (Signed)
[facility]) Care Management  [place]  08/06/2014   Sandeep Radell [facility] November 16, 1950 [id number]  Subjective:  Pt reports having back pain today, yesterday was bending over flower bed.  Pt states he was  Lying down all day, did not take medications yet or eat.  Pt states f/u with the dentist, no problems (with teeth). Pt states toe nail on second toe/left foot has been black for a month, no pain, pressure, does not remember  Hitting it.  Pt states he needs to f/u with Podiatrist.   Pt states he is eating eggplant parmesan, smaller portions, have not gone back to gym regularly.    Objective:  Vitals - BP 110/72, pulse 80, respirations 16, O2 sat 97. Weight 285 lbs.                                   Lungs clear, no c/o pain.                                   Blood sugars 79-151 am, 65-151 lunch,supper 59-253.  Averages                                                          7 day- 123, 14 day-120, 30 day-125.                                   Skin integrity: toe nail on second toe/left foot black, surrounding area pink/intact,                                                          No s/s of infection.     Current Medications:  Current Outpatient Prescriptions  Medication Sig Dispense Refill  . amLODipine (NORVASC) 5 MG tablet Take 5 mg by mouth at bedtime.    Marland Kitchen aspirin EC 81 MG tablet Take by mouth.    . canagliflozin (INVOKANA) 300 MG TABS tablet Take 300 mg by mouth daily before breakfast.    . clopidogrel (PLAVIX) 75 MG tablet Take by mouth.    . divalproex (DEPAKOTE) 500 MG DR tablet Take 500 mg by mouth. One tablet in am, 2 tablets at night    . DULoxetine (CYMBALTA) 60 MG capsule Take 60 mg by mouth daily.    Marland Kitchen esomeprazole (NEXIUM) 40 MG capsule Take 40 mg by mouth daily at 12 noon.    . furosemide (LASIX) 20 MG tablet Take 20 mg by mouth as needed. For swelling    . insulin aspart (NOVOLOG) 100 UNIT/ML injection Inject into the skin. Take up to 66  units dialy as directed via VGO kit which equals to 1.25 units per hour with  Boluses as needed (up to 12 units at meals)    . levothyroxine (SYNTHROID, LEVOTHROID) 50 MCG tablet Take by mouth.    . Liraglutide 18 MG/3ML SOPN Inject into the skin daily.    Marland Kitchen  losartan (COZAAR) 100 MG tablet     . metFORMIN (GLUCOPHAGE) 1000 MG tablet Take 1,000 mg by mouth 2 (two) times daily with a meal.    . metoprolol succinate (TOPROL-XL) 50 MG 24 hr tablet Take by mouth.    . nitroGLYCERIN (NITROSTAT) 0.4 MG SL tablet Place 0.4 mg under the tongue every 5 (five) minutes as needed.    . pioglitazone (ACTOS) 15 MG tablet Take 15 mg by mouth daily.    . pregabalin (LYRICA) 100 MG capsule Take 100 mg by mouth 3 (three) times daily.    . Probiotic Product (PROBIOTIC & ACIDOPHILUS EX ST PO) Take by mouth daily. 100 milligrams with a meal    . rosuvastatin (CRESTOR) 10 MG tablet Take 10 mg by mouth daily. Pt takes 1/2 tablet (33m total)    . vitamin C (ASCORBIC ACID) 500 MG tablet Take 500 mg by mouth daily. With a meal    . colchicine 0.6 MG tablet Take by mouth.    . diclofenac (VOLTAREN) 75 MG EC tablet One tablet twice a day as needed     No current facility-administered medications for this visit.    Assessment:   DM- fluctuation in blood sugars.                          Plan:   Pt  to continue to work on weight loss, compliance with diabetic diet.              Plan to discharge pt from RN CM services, most goals met.             [facility] SW still active with pt.                          RZara Chess   [facility]  [phone]

## 2014-08-07 NOTE — Op Note (Signed)
PATIENT NAME:  Dennis Mullen, Dennis Mullen MR#:  [id number] DATE OF BIRTH:  [birth year]  DATE OF PROCEDURE:  07/29/2012  PREOPERATIVE DIAGNOSIS: Erectile dysfunction.   POSTOPERATIVE DIAGNOSES:  1.  Erectile dysfunction. 2.  Buried penis.   PROCEDURE:  1.  Placement of inflatable penile prosthesis.  2.  Scrotoplasty.   SURGEON: Maryan Puls, M.D.   ANESTHETIST: [facility].   ANESTHESIA: General.   INDICATIONS: See the dictated history and physical. After informed consent, the patient requests the above procedures.   OPERATIVE SUMMARY: After adequate general anesthesia had been obtained, the patient's perineum and lower abdomen were scrubbed for a full 15 minutes and then painted in the usual fashion. Foley catheter was inserted in sterile fashion. Scott retractor was placed. A horizontal penoscrotal incision was made and carried down sharply through the skin and through the subcutaneous fascial tissue. The corpora cavernosa and urethra were cleared of overlying tissue using sharp dissection. Scott stay hooks were placed. Corporotomy was made on the right corpus cavernosum. 2-0 Vicryl stay sutures were applied. The right corpus cavernosum was then dilated up to 10 mm with the Hegar dilators. The right corpus cavernosum was then irrigated with GU irrigant. The procedure was repeated on the left side in an identical fashion. The Scott retractor and stay hooks were all removed. The corpora were then measured. Proximal measurement on both sides was noted to be 14 cm. Distal measurement on both sides was noted to be 4 cm. Therefore, the 12 cm 700 CX cylinders with 6 mm rear tip extenders were selected. The 65 mL reservoir was also selected. The external ring on the left side was palpated, and transversalis fascia punctured with a curved Mayo scissors. Using finger section, space of Retzius was bluntly developed. The reservoir was then placed into the space of Retzius. The reservoir was filled to 65 mL and no  back pressure was noted. Saline 55 mL was then left in the reservoir. The cylinders were placed using the [facility] inserter. Surrogate reservoir test was performed and the patient was noted to have good rigidity and good flaccid condition without SST deformity or buckling. The corporotomies were then closed with running 2-0 Vicryl suture, and the preplaced stay sutures were also applied for reinforcement. Surrogate reservoir test was then repeated and again good rigidity and good flaccidity were noted without deformity. A pocket was created in the left lower hemiscrotum for the pump. The pump was anchored with a Babcock clamp. The cylinder assembly was then connected to the reservoir assembly using a straight connector. Prosthesis was then cycled and good rigidity and flaccidity were noted. The cylinders were fully deflated. The  surgical field was irrigated with GU irrigant. Dartos fascia was then closed with running 2-0 Vicryl suture. Subcutaneous fascial tissue was reapproximated with 4-0 Vicryl suture. Scrotoplasty was performed by closing the initial incision vertically and excising redundant tags of skin on either end. The skin edges were reapproximated with 3-0 chromic suture. A sterile dressing, fluffs, and scrotal supporter were applied. Sponge, needle and instrument counts were noted to be correct. The procedure was then terminated and patient was transferred to the recovery room in stable condition.    ____________________________ Otelia Limes. Yves Dill, MD mrw:aw D: 07/29/2012 10:31:55 ET T: 07/29/2012 10:40:18 ET JOB#: [id number]  cc: Otelia Limes. Yves Dill, MD, <Dictator> Royston Cowper MD ELECTRONICALLY SIGNED 07/29/2012 12:31

## 2014-08-07 NOTE — H&P (Signed)
PATIENT NAME:  Dennis Mullen, Dennis Mullen MR#:  [id number] DATE OF BIRTH:  10-18-1950  DATE OF ADMISSION:  11/24/2012  PRIMARY CARE PHYSICIAN:  Dr. Leta Baptist  REFERRING PHYSICIAN: Dr. Corky Downs  CHIEF COMPLAINT: Chest pressure and high blood sugar.   HISTORY OF PRESENT ILLNESS: The patient is a 64 year old male with a past medical history of coronary artery disease, status post three stents in the past, and recent history of cardiac stress test in for 2013 which was normal. Sees Dr. [name] as an outpatient. Is presenting to the ER with a chief complaint of chest pressure. Today around 4:00 a.m. he started having chest pressure associated with some shortness of breath and nausea. Denies any dizziness or loss of consciousness. The patient comes in to the ER also for hyperglycemia and blood sugars being 400 to 500 range. Denies any polyuria, polyphagia. By the time he arrived to the ER, his chest pressure was resolved. Initial troponin is at 0.04 and EKG has revealed old Q waves. The patient was given 324 mg of chewable aspirin in the ER and hospitalist team is called to admit the patient. During my examination, the patient denies any chest pressure or shortness of breath.   PAST MEDICAL HISTORY: Coronary artery disease, status post three stents in the past, recent history of stress test in October which was normal, hypertension, hyperlipidemia, insulin-dependent diabetes mellitus, bipolar disorder, panic attacks, depression, suicide attempts x 5 in the past, hypothyroidism, bipolar disorder, history of MRSA in the past.   PAST SURGICAL HISTORY: Status post three coronary stent placements and angioplasty, tonsillectomy, right foot surgery with a bone graft.   ALLERGIES: He has no known drug allergies.   PSYCHOSOCIAL HISTORY: Lives alone. Denies smoking, alcohol or illicit drug usage.   FAMILY HISTORY: Father has a history of coronary artery disease and paternal uncle has diabetes mellitus.   HOME  MEDICATIONS: NovoLog 70/30, 60 units subcutaneous twice a day, Nexium 40 mg once daily, metoprolol extended release 50 mg once daily, Lyrica 50 mg 3 times a day, levothyroxine 50 mcg once daily, aspirin 81 mg once daily, Crestor 20 mg once daily, Cymbalta 30 mg once daily.   REVIEW OF SYSTEMS:  CONSTITUTIONAL: Denies any fever, fatigue.  EYES: Denies blurry vision, glaucoma.  EARS, NOSE, THROAT: Denies any epistaxis discharge, denies cough, chronic obstructive pulmonary disease.  CARDIOVASCULAR: Complaining of chest pressure which resolved by the time he arrived to the ER. Denies any syncope.  GASTROINTESTINAL: No nausea, vomiting, diarrhea.  GENITOURINARY: Has history of rectal dysfunction. Denies any hematuria.  ENDOCRINOLOGY:  No polyuria, nocturia.  HEMATOLOGIC/LYMPHATIC:  Denies any anemia, easy bruising.  INTEGUMENT: No rashes, lesions.  MUSCULOSKELETAL: No joint pain in the neck, back. Denies gout.  NEUROLOGIC:  Denies any history of vertigo, ataxia.  PSYCHIATRIC: Has history of bipolar disorder and depression.   PHYSICAL EXAMINATION: VITAL SIGNS: Temperature 98.3, pulse 77, respirations 20, blood pressure 159/59, pulse oximetry 96% on 2 liters.  GENERAL APPEARANCE: Not in any acute distress, morbidly obese.  HEENT: Normocephalic, atraumatic. Pupils are equally reacting to light and accommodation. No scleral icterus. No conjunctival injection. No sinus tenderness. No postnasal drip.  NECK: Supple. No JVD. No thyromegaly. No lymphadenopathy. Range of motion is intact.  LUNGS: Clear to auscultation bilaterally. No accessory muscle usage. No anterior chest wall tenderness on palpation.  CARDIAC: S1, S2 normal. Regular rate and rhythm. Positive murmur.  GASTROINTESTINAL: Soft, obese. Bowel sounds are positive in all four quadrants. Nontender, nondistended. No hepatosplenomegaly.  NEUROLOGIC:  Awake, alert, oriented x 3. Motor and sensory grossly intact. Reflexes are 2+.  EXTREMITIES: No  edema. No cyanosis. No clubbing.  SKIN: Warm to touch. Normal turgor. No rashes. No lesions.  MUSCULOSKELETAL: No joint effusion, tenderness or erythema.  PSYCHIATRIC: Normal mood and affect. Denies any suicidal or homicidal ideation.    LABORATORY, DIAGNOSTIC AND RADIOLOGIC DATA:  EKG has revealed normal sinus rhythm Q waves.   Chest x-ray: No acute findings.   Glucose 417, BUN 22, creatinine 1.06, sodium 135, potassium 4.3, CO2 27, GFR greater than 60, osmolality 291, calcium 8.4. CK total 704, CPK-MB 21.6, troponin 0.04. WBC 7.5, hemoglobin 12.7, hematocrit 37.9, platelet count 165.   ASSESSMENT AND PLAN: A 64 year old Caucasian male presenting to the ER with a chief complaint of chest pressure and high blood sugar and will be admitted with the following assessment and plan:  1.  Chest pain with pressure, rule out acute coronary syndrome with past medical history of coronary artery disease, status post three stents, recent stress test in October 2013 normal. Admit to telemetry. Cycle cardiac biomarkers. Acute coronary syndrome protocol with oxygen, nitroglycerin, aspirin, beta blocker and statin. We will obtain echocardiogram. Cardiology consult is placed to Dr. [name].  2.  Elevated creatine kinase. We will provide him IV fluids. If the creatine kinase levels are up trending, we will discontinue statin.  3.  Insulin-dependent diabetes mellitus with hyperglycemia. We will resume his home dose insulin Novolin 70/30 and titrate on an as needed basis.  4.  History of depression. Denies any suicidal or homicidal ideation. Resume his home medication Cymbalta.  5.  Hypertension. Blood pressure was initially elevated, looks like uncontrolled.  We will titrate medications on an as needed basis. Resume home medications for now.  6.  We will provide him gastrointestinal and deep vein thrombosis prophylaxis.   CODE STATUS:  He is FULL CODE. Son is medical power of attorney.   The plan of care was  discussed in detail with the patient. He agrees to the plan.   TOTAL TIME SPENT ON ADMISSION:  50 minutes.    ____________________________ Nicholes Mango, MD ag:cc D: 11/24/2012 08:04:37 ET T: 11/24/2012 09:13:51 ET JOB#: [id number]  cc: Nicholes Mango, MD, <Dictator> Nicholes Mango MD ELECTRONICALLY SIGNED 11/28/2012 5:54
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
## 2014-08-07 NOTE — H&P (Signed)
PATIENT NAME:  Dennis Mullen, Dennis Mullen MR#:  [id number] DATE OF BIRTH:  11/21/1950  DATE OF ADMISSION:  07/29/2012  CHIEF COMPLAINT: Erectile dysfunction.   HISTORY OF PRESENT ILLNESS: The patient is a 64 year old white male with a greater than 4-year history of erectile dysfunction. He comes in today for placement of an inflatable penile prosthesis. He has tried medical therapy in the past as well as vacuum devices without satisfaction.   ALLERGIES: No drug allergies.   CURRENT MEDICATIONS: Include Victoza, NovoLog, insulin, divalproex, amlodipine, losartan/HCTZ, metoprolol ER, Crestor,  levothyroxine, Nexium, AndroGel, Ambien, Lyrica, Cymbalta, aspirin, vitamin C and terbinafine.   PAST SURGICAL HISTORY: 1. Mastoidectomy and tonsillectomy in 1976. 2. Coronary artery stent placement in 2000 and 2002.  3. Right foot surgery with a bone graft.   SOCIAL HISTORY: The patient denied tobacco or alcohol use.   FAMILY HISTORY: Remarkable for coronary artery disease and diabetes.   PAST AND CURRENT MEDICAL CONDITIONS:  1. Hypertension.  2. Diabetes.  3. Coronary artery disease.  4. Hyperlipidemia.  5. Bipolar disorder.  6. Depression.  7. Chronic sinusitis.  8. History of prior suicide attempts secondary to depression.   REVIEW OF SYSTEMS: The patient denied chest pain, shortness of breath or stroke.  PHYSICAL EXAMINATION: GENERAL: A well-nourished white male in no acute distress.  HEENT: Sclerae were clear. Pupils were equally round, reactive to light and accommodation. Extraocular movements were intact.  NECK: Supple. No palpable cervical adenopathy.  LUNGS: Clear to auscultation.   CARDIOVASCULAR:  Regular rhythm and rate without audible murmurs.  ABDOMEN: Soft, nontender abdomen.  GENITOURINARY: The patient is uncircumcised. Testes are smooth and nontender, 16 mL in size each.  RECTAL: 35 grams smooth, nontender prostate.  NEUROMUSCULAR: Alert and oriented x 3.   IMPRESSION: Erectile  dysfunction.   PLAN: Placement of AMS inflatable penile prosthesis.    ____________________________ Otelia Limes. Yves Dill, MD mrw:cb D: 07/23/2012 15:38:50 ET T: 07/23/2012 15:51:07 ET JOB#: [id number]  cc: Otelia Limes. Yves Dill, MD, <Dictator> Royston Cowper MD ELECTRONICALLY SIGNED 07/24/2012 9:31

## 2014-08-07 NOTE — Consult Note (Signed)
PATIENT NAME:  Dennis Mullen, Dennis Mullen MR#:  [id number] DATE OF BIRTH:  07/15/1950  DATE OF CONSULTATION:  11/24/2012  REFERRING PHYSICIAN:  Dr. Posey Pronto CONSULTING PHYSICIAN:  Corey Skains, MD  REASON FOR CONSULTATION: Acute chest discomfort with known coronary artery disease possibly consistent with unstable angina.   CHIEF COMPLAINT: "I had chest pain."   HISTORY OF PRESENT ILLNESS: This is an elderly male with known coronary artery disease, status post coronary artery bypass graft, hypertension, hyperlipidemia, diabetes who has had worsening episodes of diabetic concerns and was calling the EMS. At the time the EMS came, the patient eventually had some chest pressure and pain and was easily relieved with rest. The patient has had waxing and waning chest pressure when seen in the Emergency Room and now is completely resolved. The patient has had an EKG showing normal sinus rhythm, no evidence of myocardial infarction and troponin, CK-MB were within normal limits. He currently is pain free with appropriate medication management.   REVIEW OF SYSTEMS: The remainder is negative for vision change, ringing in the ears, hearing loss, cough, congestion, heartburn, nausea, vomiting, diarrhea, bloody stools, stomach pain, extremity pain, leg weakness, cramping of the buttocks, known blood clots, headaches, blackouts, dizzy spells, nosebleeds, congestion, trouble swallowing, frequent urination, urination at night, muscle weakness, numbness, anxiety, depression, skin lesions, skin rashes.   PAST MEDICAL HISTORY: 1.  Coronary artery disease, status post coronary artery bypass graft.  2.  Hypertension.  3.  Hyperlipidemia.  4.  Diabetes.   FAMILY HISTORY: No family members with early onset of cardiovascular disease or hypertension.   SOCIAL HISTORY: Currently denies alcohol or tobacco use.   ALLERGIES: As listed.  MEDICATIONS: As listed.   PHYSICAL EXAMINATION: VITAL SIGNS: Blood pressure is 148/62  bilateral, heart rate 70 upright, reclining and regular.  GENERAL: He is a well-appearing male in no acute distress.  HEAD, EYES, EARS, NOSE, THROAT: No icterus, thyromegaly, ulcers, hemorrhage or xanthelasma.  CARDIOVASCULAR: Regular rate and rhythm. Normal S1 and S2. PMI is diffuse. Carotid upstroke normal without bruit.  Jugular venous pressure is normal.  LUNGS: Lungs have a few basilar crackles with normal respirations.  ABDOMEN: Soft, nontender without hepatosplenomegaly or masses. Abdominal aorta cannot be felt or heard.  EXTREMITIES: 2+ bilateral pulses in dorsal, pedal, radial, and femoral arteries without lower he extremity cyanosis, clubbing, or ulcers.  NEUROLOGICAL: He is oriented to time, place and person with normal mood and affect.   ASSESSMENT: Elderly male with hypertension, hyperlipidemia, diabetes, chest pressure consistent with unstable angina without current evidence of myocardial infarction.   RECOMMENDATIONS: 1.  Admit to telemetry with telemetry unit nursing.  2.  Serial ECG and enzymes to assess for possible myocardial infarction.  3.  Topical nitrates for chest pain.  4.  Continue outpatient medication management for risk factor modification, including diabetes, hypertension and hyperlipidemia.  5.  Further diagnostic testing, if necessary, if evidence of elevated troponin or recurrent chest pain. Otherwise, with possible discharge to home if the patient has no further symptoms.   ____________________________ Corey Skains, MD bjk:cb D: 11/24/2012 22:27:33 ET T: 11/24/2012 23:13:29 ET JOB#: [id number]  cc: Corey Skains, MD, <Dictator> Corey Skains MD ELECTRONICALLY SIGNED 11/25/2012 7:30

## 2014-08-08 NOTE — Discharge Summary (Signed)
PATIENT NAME:  Dennis Mullen, Dennis Mullen MR#:  [id number] DATE OF BIRTH:  1950-06-04  DATE OF ADMISSION:  08/14/2013 DATE OF DISCHARGE:  08/15/2013  ADDENDUM  I dictated that the patient had 2 stents in the RCA, actually had 1 stent in the RCA. The other was an angioplasty in the other lesion.   ____________________________ Donell Beers. Benjie Karvonen, MD spm:jcm D: 08/16/2013 11:07:07 ET T: 08/16/2013 21:16:01 ET JOB#: [id number]  cc: Rafaella Kole P. Benjie Karvonen, MD, <Dictator> Donell Beers Alexei Ey MD ELECTRONICALLY SIGNED 08/17/2013 12:27

## 2014-08-08 NOTE — Consult Note (Signed)
PATIENT NAME:  Dennis Mullen, Dennis Mullen MR#:  [id number] DATE OF BIRTH:  [birth year]  DATE OF CONSULTATION:  08/14/2013  CONSULTING PHYSICIAN:  Isaias Cowman, MD  PRIMARY CARE PHYSICIAN: Dr. Clayborn Bigness.   PRIMARY CARDIOLOGIST: Dr. [name].   CHIEF COMPLAINT: Chest pain.   REASON FOR CONSULTATION: Consultation requested for evaluation of chest pain and elevated troponin.   HISTORY OF PRESENT ILLNESS: The patient is a 64 year old gentleman with known coronary artery disease, status post prior multiple coronary stents who presents with chest pain. The patient reports that during the past several weeks he has had intermittent mild exertional chest discomfort. He has been followed by Dr. [name]. He was scheduled for stress echocardiogram. The patient was usual state of health until day of admission, when he awoke with substernal chest discomfort. The patient reports his symptoms waxed and waned for two hours. Symptoms would get worse when he did minimal to moderate exertion, such as walking out to the mailbox. The patient presented to [facility] Emergency Room where EKG revealed  nonspecific ST changes laterally. Admission labs were notable for initial troponin of 0.03, followed by a troponin of 0.39. Initial CPK MB was 19.2. Following admission, the patient has remained chest pain-free.   PAST MEDICAL HISTORY: 1. Status post coronary stent 2. Status post stent OM1 6/06.  3. Status post redo stent OM1 for end stent restenosis, 9/06. 4. Hypertension.  5. Hyperlipidemia.  6. Diabetes.   HOME MEDICATIONS: Aspirin 81 mg daily, furosemide 20 mg daily, amlodipine 5 mg daily, Crestor 20 mg daily, Toprol-XL 50 mg daily, Hyzaar 100/25 daily, Glucovance 5/500 1 tab daily, allopurinol 100 mg daily, Depakote 1 tab b.i.d., Levitra 20 mg daily p.r.n., levothyroxine 50 mcg daily, Victoza pen injector, Nexium 40 mg daily, Cymbalta 30 mg daily, NovoLog sliding scale, AndroGel 1.62%  2 pumps daily, Ambien 1 tablet at  bedtime.   SOCIAL HISTORY: The patient currently lives alone. He denies tobacco or EtOH abuse.   FAMILY HISTORY: Father died status post myocardial infarction at age 29. The patient also has a brother status post coronary artery bypass graft surgery.   REVIEW OF SYSTEMS:  CONSTITUTIONAL: No fever or chills.  EYES: No blurry vision.  EARS: No hearing loss.  RESPIRATORY: The patient has shortness of breath with chest pain.  CARDIOVASCULAR: Chest pain as described above.  GASTROINTESTINAL: No nausea, vomiting, or diarrhea.  GENITOURINARY: No dysuria or hematuria.  ENDOCRINE: No polyuria or polydipsia.  MUSCULOSKELETAL: No arthralgias or myalgias.  NEUROLOGICAL: No focal muscle weakness or numbness.  PSYCHOLOGICAL: No depression or anxiety.   PHYSICAL EXAMINATION: VITAL SIGNS: Blood pressure 130/77, pulse 74, respirations 18, temperature 97.5, pulse oximetry 94%.  HEENT: Pupils equal, reactive to light and accommodation.  NECK: Supple without thyromegaly.  LUNGS: Clear.  HEART: Normal JVP. Normal PMI. Regular rate and rhythm. Normal S1, S2. No appreciable gallop, murmur, or rub.  ABDOMEN: Soft and nontender. Pulses were intact bilaterally.  MUSCULOSKELETAL: Normal muscle tone.  NEUROLOGIC: The patient is alert and oriented x 3. Motor and sensory both grossly intact.   IMPRESSION: A 64 year old gentleman with known coronary artery disease, status post prior coronary stents who presents with a prolonged episode of chest pain with abnormal EKG and has ruled in for non-ST elevation myocardial infarction with elevated troponin and CPK-MB.   RECOMMENDATIONS: 1. Agree with current therapy.  2. Proceed with cardiac catheterization with selective coronary arteriography for 08/14/2013. The risks, benefits, and alternatives were explained and informed written consent obtained.   ____________________________  Isaias Cowman, MD ap:sg D: 08/14/2013 08:57:25 ET T: 08/14/2013 09:24:18  ET JOB#: [id number]  cc: Isaias Cowman, MD, <Dictator> Isaias Cowman MD ELECTRONICALLY SIGNED 08/19/2013 12:47

## 2014-08-08 NOTE — Consult Note (Signed)
Brief Consult Note: Diagnosis: acute pancreatitis.   Patient was seen by consultant.   Discussed with Attending MD.   Comments: Patient seen and examined. Still with abdominal pain, no real improvement compared to yesterday. Is on a CL diet, admits he didnt eat much of breakfast- does not make the pain worse but just doesnt have the appetite.  Denies alcohol. TGs significantly high at 917. The patient admits he was taken off Crestor 20mg  for a while and was just resumed on 10mg  dose a few days ago. Will need better long-term control of his TGs/cholesterol. Awaiting abdominal US.   Continue symptomatic management, CL diet for now. Will follow. May need repeat imaging as an outpatient-will discuss with Dr Rayann Heman and dictate full consult..  Electronic Signatures: Loren Racer M (PA-C)  (Signed 15-Jun-15 14:32)  Authored: Brief Consult Note   Last Updated: 15-Jun-15 14:32 by Sherald Barge (PA-C)

## 2014-08-08 NOTE — H&P (Signed)
PATIENT NAME:  Dennis Mullen, Dennis Mullen MR#:  [id number] DATE OF BIRTH:  Sep 03, 1950  DATE OF ADMISSION:  01/26/2014  DATE OF ASSESSMENT:  01/27/2014   REFERRING PHYSICIAN:  Emergency Room MD   ATTENDING PHYSICIAN:  Poet Hineman B. Bary Leriche, MD   IDENTIFYING DATA:  Dennis Mullen is a 64 year old male with history of bipolar depression and borderline personality disorder.   CHIEF COMPLAINT:  "I'm feeling overwhelmed."   HISTORY OF PRESENT ILLNESS:  Dennis Mullen was diagnosed with bipolar disorder most of his life. His condition worsened substantially when his wife left him 6 or 7 years ago. He attempted 4 suicides since including one by overdose on 200 units of insulin and has been unstable ever since in spite of excellent psychiatric care that he receives from Dr. Weber Cooks, his primary psychiatrist. The patient reports that lately he started feeling more depressed, lonely, overwhelmed, and suicidal with a plan to overdose on his multiple medications. He is on 21 different medicines and was getting ready to overdose again. He cannot identify new stressors, although worsening of his general health and the cost of treatment and medication and loneliness play a role. He also thinks that the cabin fever for the past 11 days when it was raining and he was confined to his house could have contributed as well. He reports poor sleep, decreased appetite, anhedonia, feelings of guilt, hopelessness, worthlessness, poor energy and concentration, social isolation, crying spells, and now suicidal ideation. Fortunately, he decided to drive himself to the hospital prior to taking any medicines.   PAST PSYCHIATRIC HISTORY:  As above, long history of bipolar depression and borderline personality disorder in the care of Dr. Weber Cooks and a therapist who unfortunately is not available at the moment. He has been compliant with medication, but there are 4 suicide attempts in the past. He has been hospitalized for all of them.   FAMILY  PSYCHIATRIC HISTORY:  None reported.   PAST MEDICAL HISTORY:  Diabetes, gout, hypertension, hypothyroidism, GERD.   ALLERGIES:  No known drug allergies.   MEDICATIONS ON ADMISSION:  Losartan 100 mg daily, gemfibrozil 800 mg twice daily, clopidogrel 75 mg daily, nitroglycerin 0.4 mg as needed, Ambien 10 mg at bedtime, aspirin 81 mg daily, Nexium 40 mg daily, Cymbalta 60 mg daily, Toprol-XL 50 mg daily, amlodipine 5 mg daily, Depakote 500 mg at bedtime, Depakote 1000 mg in the morning, AndroGel topically, allopurinol 100 mg daily, furosemide 20 mg daily, colchicine 0.6 mg twice daily, Neurontin 300 mg twice daily, Synthroid 50 mcg daily, Actos 15 mg daily, Crestor 10 mg daily, Invokana 300 mg daily, Victoza 18 mg/3 mL solution once a day per sliding scale, NovoLog insulin sliding scale.   SOCIAL HISTORY:  He is disabled. He lives by himself. His wife left him when he refused to stop engaging in Internet sex. He is not in touch with his family. He participates in church.   REVIEW OF SYSTEMS: CONSTITUTIONAL:  No fevers or chills. Positive for some weight loss, as he has been skipping meals.  EYES:  No double or blurred vision.  EARS, NOSE, AND THROAT:  No hearing loss.  RESPIRATORY:  No shortness of breath or cough.  CARDIOVASCULAR:  No chest pain or orthopnea.  GASTROINTESTINAL:  No abdominal pain, nausea, vomiting, or diarrhea.  GENITOURINARY:  No incontinence or frequency.  ENDOCRINE:  No heat or cold intolerance.  LYMPHATIC:  No anemia or easy bruising.  INTEGUMENTARY:  No acne or rash.  MUSCULOSKELETAL:  No muscle or joint pain.  NEUROLOGIC:  No tingling or weakness.  PSYCHIATRIC:  See history of present illness for details.   PHYSICAL EXAMINATION: VITAL SIGNS:  Blood pressure 158/85, pulse 63, respirations 20, temperature 98.1.  GENERAL:  This is a well-developed, middle-aged male in no acute distress.  HEENT:  The pupils are equal, round, and reactive to light. Sclerae are anicteric.   NECK:  Supple. No thyromegaly.  LUNGS:  Clear to auscultation. No dullness to percussion.  HEART:  Regular rhythm and rate. No murmurs, rubs, or gallops.  ABDOMEN: Soft, nontender, nondistended. Positive bowel sounds.  MUSCULOSKELETAL:  Normal muscle strength in all extremities.  SKIN:  No rashes or bruises.  LYMPHATIC:  No cervical adenopathy.  NEUROLOGIC:  Cranial nerves II through XII are intact.   LABORATORY DATA:  Chemistries are within normal limits except for blood glucose of 123, BUN 20, creatinine 1.36. Alcohol is 0. LFTs are within normal limits except for AST of 39. Urine toxicology screen is negative for substances. CBC is within normal limits. Urinalysis is not suggestive of urinary tract infection. Serum acetaminophen and salicylates are low.   MENTAL STATUS EXAMINATION ON ADMISSION:  The patient is alert and oriented to person, place, time, and situation. He is pleasant, polite, and cooperative. He is well groomed and casually dressed. He maintains good eye contact. His speech is of normal rhythm, rate, and volume. Actually, he is very pleasant and chatty. He describes his mood as depressed, but there is full affect. Thought process is logical and goal-oriented. Thought content:  He denies thoughts of hurting himself on the unit but does admit that if he were at home, he would still be thinking about taking medicines. There are no thoughts of hurting others. There are no delusions or paranoia. There are no auditory or visual hallucinations. His cognition is grossly intact. Registration, recall, and short- and long-term memory are intact. He is of average intelligence and fund of knowledge. His insight and judgment are questionable.   SUICIDE RISK ASSESSMENT ON ADMISSION:  This is a patient with a long history of depression, mood instability, and suicide attempts, who came to the hospital again suicidal in the context of worsening of his general health and financial and relationship  problems.   INITIAL DIAGNOSES:  AXIS I:  Bipolar affective disorder, depressed, severe without psychotic features.   AXIS II:  Borderline personality disorder.   AXIS III:  Diabetes, hypertension, gout, hypothyroidism, gastroesophageal reflux disease.   PLAN:  The patient was admitted to [facility] unit for safety, stabilization, and medication management. He was initially placed on suicide precautions and was closely monitored for any unsafe behaviors. He underwent full psychiatric and risk assessment. He received pharmacotherapy, individual and group psychotherapy, substance abuse counseling, and support from therapeutic milieu.   1.  Suicidal ideation. He is able to contract for safety in the hospital.  2.  Mood. We will continue Cymbalta and Depakote as prescribed by Dr. Weber Cooks.  3.  Insomnia. We will continue Ambien for sleep.  4.  Medical. We will continue treatment of diabetes even though it is a little bit of a problem. Insulin pump is not allowed on the unit. We will ask medicine for help. We will continue treatment of gout, hypertension, and hypothyroidism.   DISPOSITION:  He will return to home. He will follow up with Dr. Weber Cooks.    ____________________________ Wardell Honour Bary Leriche, MD jbp:nb D: 01/27/2014 21:58:06 ET T: 01/27/2014 22:33:12 ET JOB#: [id number]  cc: Lynlee Stratton B.  Bary Leriche, MD, <Dictator> Clovis Fredrickson MD ELECTRONICALLY SIGNED 02/23/2014 6:13

## 2014-08-08 NOTE — H&P (Signed)
PATIENT NAME:  Dennis Mullen, Dennis Mullen MR#:  [id number] DATE OF BIRTH:  [birth year]  DATE OF ADMISSION:  09/29/2013  REFERRING PHYSICIAN: Dr. Lavonia Drafts  PRIMARY CARE PHYSICIAN: Dr. Loyal Buba.  PRIMARY CARDIOLOGIST: Dr. [name].  CHIEF COMPLAINT: Abdominal pain.   HISTORY OF PRESENT ILLNESS: This is a 64 year old male with significant past medical history of coronary artery disease with recent hospitalization in early May, status post two stent placements, diabetes, hypertension, hypothyroidism. The patient presents with complaints of abdominal pain, reports it started yesterday, in epigastric area.  Denies any fever, chills, vomiting, diarrhea, constipation, reports some mild nausea. The patient had basic work-up done in ED which did show elevated lipase at 694. The patient had CT abdomen and pelvis with contrast which did show evidence of mild acute pancreatitis. Hospitalist service was requested to admit the patient for further hydration and management of his pain.   PAST MEDICAL HISTORY: 1. Coronary artery disease, status post multiple stents.  2. Hypertension.  3. Hyperlipidemia.  4. Insulin-dependent diabetes mellitus.  5. Bipolar disorder.  6. Panic attack.  7. Depression. 8.  Suicide attempt x 6 in the past.  9. Hypothyroidism.  10. Diverticulosis.   PAST SURGICAL HISTORY:  1. Multiple cardiac stents in the past.  2. Angiograph.  3. Tonsillectomy.  4. Right foot surgery with bone graft.   SOCIAL HISTORY: Denies any smoking, any alcohol or illicit drug use.   FAMILY HISTORY: Significant for coronary artery disease and diabetes.   HOME MEDICATIONS: 1. Allopurinol 100 mg daily.  2. Ambien 10 mg 1/2 tablet to 1 tablet at bedtime.  3. Amlodipine 5 mg daily.  4. AndroGel topically daily.  5. Aspirin 81 mg daily.  6. Colchicine 0.6 mg 2 times a day as needed for gout attacks.  7. Cymbalta 60 mg daily.  8. Depakote extended release 100 mg oral daily.  9. Depakote 500 mg  extended release 2 tablets at bedtime.  10. Fish oil 2 times daily.  11. Lasix 20 mg as needed.  12. Gabapentin 300 mg 2 times a day.  13. Glucovance 5/500 3 times a day.  14. Hyzaar 25/100 once daily.  15. Lantus  subcu 52 units at bedtime.  16. Levothyroxine 50 mcg daily.  17. Multivitamin daily.  18. Nexium 40 mg daily.  19. NovoLog sliding scale.  20. Toprol-XL 50 mg once a day.  21. Victoza  1.8 mg subcutaneous daily.   REVIEW OF SYSTEMS: CONSTITUTIONAL: The patient denies any fever, any chills, any fatigue, any weakness.  EYES: Denies blurry vision, double vision, inflammation.  ENT: Denies tinnitus, ear pain, hearing loss, epistaxis.  RESPIRATORY: Denies cough, wheezing, hemoptysis.  CARDIOVASCULAR: Denies any chest pain, palpitation, edema, arrhythmia.  GASTROINTESTINAL: Reports abdominal pain. Mild nausea. Denies any vomiting, diarrhea, constipation.  GENITOURINARY: Denies dysuria, hematuria, renal colic.   ENDOCRINE: Denies polyuria, polydipsia, heat or cold intolerance.  HEMATOLOGY: Denies anemia, easy bruising, bleeding diathesis.  INTEGUMENT: Denies acne, rash or skin lesion.  MUSCULOSKELETAL: Denies any pain or swelling in his joints.  NEUROLOGIC: Denies any history of CVA, transient ischemic attack, no numbness no tremors, vertigo.  PSYCHIATRIC: Denies anxiety, insomnia.   PHYSICAL EXAMINATION: VITAL SIGNS: Temperature 99.3, pulse 78, respiratory rate 20, blood pressure 152/65, saturating  98% on room air.  GENERAL: Obese male who looks comfortable in bed, in no apparent distress.  HEENT: Head atraumatic, normocephalic. Pupils equal, reactive to light. Pink conjunctivae. Anicteric sclerae. Moist oral mucosa.  NECK: Supple. No thyromegaly. No JVD.  CHEST:  Good air entry bilaterally. No wheezing, rales, rhonchi.  CARDIOVASCULAR: S1, S2 heard. No rubs, murmurs or gallops.  ABDOMEN: Obese, mild tenderness in epigastric area. No rebound, no guarding. Bowel sounds  present in four quadrants.  EXTREMITIES: No edema. No clubbing. No cyanosis. Pedal and radial pulses felt bilaterally.  PSYCHIATRIC: Appropriate affect. Awake, alert x 3. Intact judgment and insight.  NEUROLOGIC: Cranial nerves grossly intact. Motor five out of five. No focal deficits.  MUSCULOSKELETAL: No joint effusion or erythema.   PERTINENT LABORATORY DATA: Glucose 346, BUN 31, creatinine 1.35, sodium 149, potassium 4.7, chloride 94, CO2 28, ALT 43, AST 36, alkaline phosphatase 99. White blood cells 12.8, hemoglobin 15.4, hematocrit 41.9, platelets 219. Urinalysis, negative leukocyte esterase and nitrite.   IMAGING STUDIES: CT abdomen and pelvis with contrast, mild acute pancreatitis noted with mild focal soft tissue inflammation about the body of the neck area 0.8 cm focus of decreased attenuation.   ASSESSMENT AND PLAN:  1. Acute pancreatitis. The patient denies any alcohol abuse, we will check lipid panel. The patient does not have any significant nausea or vomiting, so we will keep  on clear liquid diet to keep him on aggressive IV fluids. We will give a total of 1 liter bolus at 500 mL per hour. We will repeat lipase level in the morning. We will keep him on p.r.n. nausea and pain medicine. We will consult gastroenterology service.  2. History of coronary artery disease. Denies any chest pain continue with aspirin, Plavix and beta blockers, Hyzaar and nitro.  3. Hypertension. Blood pressure mildly elevated. Resume back on home medication.  4. Hyperlipidemia. Continue with fish oil.  5. Diabetes mellitus, uncontrolled. We will give the fluids. We will resume him back on his Lantus and insulin sliding scale. We will hold other oral hypoglycemic agents as his p.o. intake is unpredictable with his acute pancreatitis.  6. Bipolar disorder, panic attacks and depression. Continue back on his home medication.  7. Hypothyroidism. Continue with Synthroid.  8. Deep vein thrombosis prophylaxis.  Subcutaneous heparin.  9. CODE STATUS: Full code.   TOTAL TIME SPENT ON ADMISSION AND PATIENT CARE: 55 minutes.     ____________________________ Albertine Patricia, MD dse:sg D: 09/29/2013 06:01:00 ET T: 09/29/2013 06:25:31 ET JOB#: [id number]  cc: Albertine Patricia, MD, <Dictator> Bindi Klomp Graciela Husbands MD ELECTRONICALLY SIGNED 09/30/2013 2:03

## 2014-08-08 NOTE — H&P (Signed)
PATIENT NAME:  Dennis Mullen, Dennis Mullen MR#:  [id number] DATE OF BIRTH:  [birth year]  DATE OF ADMISSION:  08/13/2013  PRIMARY CARE PHYSICIAN: Dr. Clayborn Bigness.   PRIMARY CARDIOLOGIST: Dr. [name].  REFERRING EMERGENCY ROOM PHYSICIAN: Dr. Jimmye Norman   CHIEF COMPLAINT: Chest pain.   HISTORY OF PRESENT ILLNESS: A 64 year old male who has history of coronary artery disease, diabetes, hypertension status post stent, hypothyroidism. For the last few months, especially in the winter while shoveling his snow, he felt somewhat chest pain, which was just lasting for a few seconds to minutes and going away by itself. Since then, he had a few times experienced this exercise-induced pain, when he tried to over-exert himself. He claims himself to be very active and doing regular exercise, but when does it in moderation he does not feel any pain. He has been following with Dr. [name] regularly for his cardiac issues, and he was scheduled for stress echo. The plan is to do it next week. This morning when the patient woke up, he had severe of pain in his chest. He stayed in the bed for a few minutes or an hour and then it went away. When he was taking a shower, he again had severe pain in his chest, which was  retrosternal, kind of pressure-like, aching, and it almost felt like his previous heart attack, so he came out of the shower, wiped himself off and then took some rest and the pain slowly subsided, so he spoke to his friend about this issue and then decided to come to Emergency Room. While he was in the Emergency Room waiting area, he again felt similar pain, which also subsided on its own. Because of his strong cardiac history and having very typical pain, ER physician decided to admit him to medical services and contacted [place]. On further questioning, he denies any cough or palpitations.   REVIEW OF SYSTEMS:  CONSTITUTIONAL: Negative for fever, fatigue, weakness, but has chest pain.  EYES: No blurring, double vision,  discharge or redness.  EARS, NOSE, THROAT: No tinnitus, ear pain or hearing loss.  RESPIRATORY: No cough, wheezing, hemoptysis, but with this episode, he was feeling a little short of breath.  CARDIOVASCULAR: Has chest pain, retrosternal, as mentioned above. No orthopnea, edema, arrhythmia, palpitations.  GASTROINTESTINAL: No nausea, vomiting, diarrhea, abdominal pain.  GENITOURINARY: No dysuria, hematuria, or increased frequency.  ENDOCRINE: No heat or cold intolerance.  SKIN: No acne, rashes, or lesions.  MUSCULOSKELETAL: No pain or swelling in the joints.  NEUROLOGICAL: No numbness, weakness, tremor or vertigo.  PSYCHIATRIC: No anxiety, insomnia. Bipolar disorder.   PAST MEDICAL HISTORY: 1. Coronary artery disease status post stent placement multiple times.  2. Hypertension.  3. Hyperlipidemia.  4. Insulin-dependent diabetes mellitus.  5. Bipolar disorder.  6. Panic attack.  7. Depression.  8. Suicide attempts 6 times in the past.  9. Hypothyroidism.  10. Diverticulosis.   PAST SURGICAL HISTORY:  1. Three cardiac stents in the past. 2. Angiography.   3. Tonsillectomy.  4. Right foot surgery with bone graft.   SOCIAL HISTORY: The patient denies any smoking any alcohol or any illicit drug use. He was a Pharmacist, hospital in the past; currently on disability.   FAMILY HISTORY: Significant for coronary artery disease and diabetes.   HOME MEDICATIONS: 1. Allopurinol 100 mg oral once a day for gout attack.  2. Ambien 10 mg oral tablet, take one-half to 1 tablet once a day.  3. Amlodipine 5 mg oral tablet once a day.  4. AndroGel pump apply topically to affected area once a day.  5. Aspirin 81 mg oral tablet once a day.  6. Colchicine 0.6 mg oral tablet 2 times a day for 3 days as needed for gout. 7. Cymbalta 60 mg oral delayed-release capsule once a day.  8. Depakote 500 mg oral tablet extended-release once a day in the morning.  9. Depakote 500 mg extended-release 2 tablets once a day  at bedtime.  10. Fish oil 200 mg oral capsule 2 times a day.  11. Furosemide 20 mg oral tablet once a day in the morning as needed for swelling.  12. Gabapentin 300 mg oral capsule 2 times a day.  13. Glucovance 5/500 mg oral tablet 3 times a day.  14. Hyzaar 25/100 mg oral tablet once a day.  15. Lantus subcutaneous injection once a day.  16. Levothyroxine 50 mcg once a day for hypothyroidism.  17. Multivitamin with minerals once a day.  18. Nexium 40 mg delayed-release capsule once a day.  19. NovoLog  20 units subcutaneous injection 3 times a day with meals.  20. Toprol XL 50 mg oral extended-release tablet once a day.  21. Victoza 1.8 mg subcutaneous once a day.   PHYSICAL EXAMINATION: VITAL SIGNS: Temperature 98.1, pulse 83, respirations 18, blood pressure 151/79, pulse oximetry was 97% on room air.  GENERAL: The patient is fully alert and oriented to time, place, and person; does not appear in any acute distress.  HEENT: Head and neck atraumatic. Conjunctivae pink. Oral mucosa moist.  NECK: Supple. No JVD.  RESPIRATORY: Bilateral equal air entry.  CARDIOVASCULAR: S1, S2 present, regular. No murmur.  ABDOMEN: Soft, nontender. Bowel sounds present. No organomegaly.  SKIN: No rashes.  LEGS: No edema.  NEUROLOGIC: Power 5/5. Follows commands. Moves all 4 limbs.  PSYCHIATRIC: Does not appear in any acute psychiatric illness at this time.  JOINTS: No swelling or tenderness.   IMPORTANT LABORATORY RESULTS: Glucose 204, BUN 21, creatinine 1.52, sodium 135, potassium 4.2, chloride is 102, CO2 is 23, anion gap is 10. Calcium is 9.5. Troponin 0.03. WBC 7.4, hemoglobin 14.4, platelet count is 210,000. MCV is 92. Chest x-ray, PA and lateral, shows no acute cardiopulmonary abnormality.   ASSESSMENT AND PLAN: A 64 year old male with past history of coronary artery disease and stent, hypertension, diabetes, who came to the Emergency Room after having chest pain, which was typical anginal pain and  so being admitted for further evaluation.  1. Chest pain. It looks more like anginal pain with having a cardiac history and stent placement. Will admit to telemetry and follow serial troponins. Will get cardiology evaluation by his primary cardiologist, Dr. [name], or his partners tomorrow morning. Meanwhile, continue him on cardiac medications. Currently he is pain free as long as he remains in the bed; that is what he says. Because of this typical history, I would like to keep him n.p.o. from midnight and put him through a stress test. Cardiologist might also decide to go for cardiac catheterization tomorrow morning instead of a stress test directly. Explained to the patient and he understands and agreed with waiting for cardiac evaluation in the morning.  2. Acute renal insufficiency. Currently, there is slight worsening of his renal status, so I would like to give him IV fluids and Mucomyst in the assumption of having a cardiac work-up tomorrow and we will check it tomorrow morning for correction.  3. History of coronary artery disease. Will continue his aspirin, Toprol and Hyzaar. As per  him, he was taken off of statin because of his elevated CPK level, so I would like not to start it at this time and leave it up to the cardiologist.  4. History of diabetes. Currently blood pressure is slightly high, but he would be kept n.p.o. from midnight, so will just keep him on insulin sliding scale coverage and will decrease his Lantus coverage from 40 units at bedtime to 20 units here in the hospital because he might not be eating until anything tomorrow afternoon.  5. History of depression and bipolar. Will continue his Cymbalta.  6. History of gout. Continue colchicine.   CODE STATUS: Full code.   TOTAL TIME SPENT ON THIS ADMISSION: 50 minutes.     ____________________________ Ceasar Lund Anselm Jungling, MD vgv:lm D: 08/13/2013 20:11:48 ET T: 08/13/2013 22:04:44 ET JOB#: [id number]  cc: Ceasar Lund.  Anselm Jungling, MD, <Dictator> Vaughan Basta MD ELECTRONICALLY SIGNED 08/25/2013 22:17

## 2014-08-08 NOTE — Discharge Summary (Signed)
Dates of Admission and Diagnosis:  Date of Admission 10-Jul-2013   Date of Discharge 11-Jul-2013   Admitting Diagnosis suicidal attempt, insulin overdose.   Final Diagnosis Suicidal attempt with insulin and metoprolol Hypoglycemia due to insulin overdose.- resolved. Bradycardia- due to metoprolol overdose, also have HR 55-60 at baseline- so discontinued metoprolol. Depression Hypothyroidism Hyperlipidemia DM    Chief Complaint/History of Present Illness a 64 year old male with history of coronary artery disease with multiple stents in the past, followed by Dr. [name], history of diabetes mellitus on insulin, history of multiple suicidal attempts in the past, history of depression. The patient presents with history of suicide attempt. The patient reports he took 208 units of insulin this evening, insulin 70/30. As well, he reports he took a handful of metoprolol pills and then he called EMS. The patient was found to be slightly hypoglycemic by EMS with fingersticks around 60. He was given 1 amp of D50. Initially upon presentation to ED, his first fingerstick was 82, and so he was started on D10 drip. The patient was not hypotensive, was not noticed to have bradycardia. He cannot mention exactly how many pills he took. The patient's blood work was negative for salicylates and acetaminophen. The patient denies taking any other substances. Hospitalist service was requested to admit the patient.   Allergies:  No Known Allergies:   Hepatic:  26-Mar-15 04:09   Bilirubin, Total 0.6  Alkaline Phosphatase 72 (45-117 NOTE: New Reference Range 03/07/13)  SGPT (ALT) 71  SGOT (AST)  71  Total Protein, Serum 8.2  Albumin, Serum 3.7  General Ref:  26-Mar-15 04:09   Acetaminophen, Serum < 2 (10-30 POTENTIALLY TOXIC:  > 200 mcg/mL  > 50 mcg/mL at 12 hr after  ingestion  > 300 mcg/mL at 4 hr after  ingestion)  Salicylates, Serum < 1.7 (0.0-2.8 Therapeutic 2.8-20.0 mg/dL Toxic >30.0 mg/dL)   Routine Chem:  26-Mar-15 04:09   Glucose, Serum  56  BUN  22  Creatinine (comp)  1.34  Sodium, Serum 136  Potassium, Serum 3.5  Chloride, Serum 101  CO2, Serum 29  Calcium (Total), Serum 8.9  Anion Gap  6  Osmolality (calc) 273  eGFR (African American) >60  eGFR (Non-African American)  57 (eGFR values <41m/min/1.73 m2 may be an indication of chronic kidney disease (CKD). Calculated eGFR is useful in patients with stable renal function. The eGFR calculation will not be reliable in acutely ill patients when serum creatinine is changing rapidly. It is not useful in  patients on dialysis. The eGFR calculation may not be applicable to patients at the low and high extremes of body sizes, pregnant women, and vegetarians.)  Result Comment ALT/AST/ETOH/ALBUMIN - SPECIMEN LIPEMIC. RESULTS OBTAINED  - FROM AIRFUGED PLASMA...TPL  Result(s) reported on 10 Jul 2013 at 05:29AM.  Ethanol, S. < 3  Ethanol % (comp) < 0.003 (Result(s) reported on 10 Jul 2013 at 05:32AM.)  Routine Hem:  26-Mar-15 04:09   WBC (CBC) 9.5  RBC (CBC) 4.75  Hemoglobin (CBC) 14.7  Hematocrit (CBC) 43.3  Platelet Count (CBC) 222 (Result(s) reported on 10 Jul 2013 at 04:29AM.)  MCV 91  MCH 30.9  MCHC 33.9  RDW  16.1   Pertinent Past History:  Pertinent Past History 1.  Coronary artery disease status post multiple stents in the past.  2.  Hypertension.  3.  Hyperlipidemia.  4.  Insulin-dependent diabetes mellitus.  5.  Bipolar disorder.  6.  Panic attack.  7.  Depression.  8.  Suicide attempt x5  in the past. This makes it number 6. 9.  Hypothyroidism.  10.  MRSA in the past.   Hospital Course:  Hospital Course *  Suicide attempt.  by taking insulin and metoprolol.  was hypoglycemic, were monitoring on D10 initially. later came Off D10.  consult psychiatric service. continue him on his psychiatric meds meanwhile.  *  Hypoglycemia due to insulin overdose.  hold all his insulin, all his hyperglycemic agents.   on D10 with fingersticks every 1 hour. at rate 100 ml/hr, Bl sugar dropped to 52 once- required D50 Push. continue to monitor. Tapered and stopped finally . Blood sugar is stable now - he was taking metformin, glipizide, Insulin 70/30 and sliding scale insulin at home.    Will start with 70/30 to begin with and slowly go up, if needed. *  Metoprolol overdose. blood pressure is acceptable. His heart rate is acceptable. telemetry monitor.HR is in 55-60, BP is elevated later so-     Resumed HCTz and Losartan- no metoprolol due to Borderline bradycardia. *  History of coronary artery disease. on aspirin, statin . Denies any chest pain. Can not give Betablocker due to bradycardia. *  Hypertension. Blood pressure was acceptable. held all meds due to his metoprolol overdose. Now BP is high and resumed HCTz+ Losartan. *  Hyperlipidemia. Continue with statin.  *  Diabetes mellitus.  held all hypoglycemic agents due to insulin overdose.    he was taking metformin, glipizide, Insulin 70/30 and sliding scale insulin at home.     start with 70/30 to begin with and slowly go up, if needed. *  Bipolar disorder, panic disorder, depression, and suicide attempt. consult psych.  *  Hypothyroidism. continue Synthroid.   Condition on Discharge Stable   Code Status:  Code Status Full Code   DISCHARGE INSTRUCTIONS HOME MEDS:  Medication Reconciliation: Patient's Home Medications at Discharge:     Medication Instructions  nitroglycerin 0.4 mg sublingual tablet  1 tab(s) sublingual , As needed, chest pain   divalproex sodium 500 mg oral delayed release tablet  1 tab(s) orally once a day (in the morning) mood stability   divalproex sodium 500 mg oral tablet, extended release  2 tab(s) orally once a day (at bedtime) mood stability   duloxetine 60 mg oral delayed release capsule  1 cap(s) orally once a day depresion   insulin aspart 100 units/ml subcutaneous solution   subcutaneous sliding scale as needed for  elevated blood sugar   insulin aspart-insulin aspart protamine 30 units-70 units/ml subcutaneous suspension  40 unit(s) subcutaneous once a day (before a meal) Lunch. Blood sugar   insulin aspart-insulin aspart protamine 30 units-70 units/ml subcutaneous suspension  52 unit(s) subcutaneous once a day (before a meal) supper blood sugar   hydrochlorothiazide-losartan 12.5 mg-50 mg oral tablet  2 tab(s) orally once a day blood presure   allopurinol 300 mg oral tablet  1 tab(s) orally once a day gout   aspirin 81 mg oral tablet, chewable  1 tab(s) orally once a day stroke prevention   levothyroxine 50 mcg (0.05 mg) oral tablet  1 tab(s) orally once a day hypothyroid   gabapentin 300 mg oral capsule  1 cap(s) orally 2 times a day anxiety   glyburide-metformin 5 mg-500 mg oral tablet  1 tab(s) orally 3 times a day (with meals) diabetes   pantoprazole 40 mg oral delayed release tablet  1 tab(s) orally once a day stomach acid   liraglutide  1.8 milligram(s) subcutaneous once a day diabetes  STOP TAKING THE FOLLOWING MEDICATION(S):    nexium 40 mg oral delayed release capsule: 1 cap(s) orally once a day at hs and 48units at supper vitamin c 500 mg oral tablet: 1 tab(s) orally once a day metoprolol extended release 50 mg oral tablet, extended release: 1 tab(s) orally once a day in am onglyza 5 mg oral tablet: 1 tab(s) orally once a day  Physician's Instructions:  Diet Low Sodium  Low Fat, Low Cholesterol  Carbohydrate Controlled (ADA) Diet   Activity Limitations As tolerated   Return to Work Not Applicable   Time frame for Follow Up Appointment 2-4 weeks   Other Comments Check blood sugar daily twice, and take it to PMD- may need to change insulin dose depending on that. Stopped Metoprolol due to baseline Low HR.   Transfered to Psych floor for management of suicidal behaviour.   Electronic Signatures: Vaughan Basta (MD)  (Signed 31-Mar-15 15:22)  Authored: ADMISSION DATE AND  DIAGNOSIS, CHIEF COMPLAINT/HPI, Allergies, PERTINENT LABS, PERTINENT PAST [place] MEDS, PATIENT INSTRUCTIONS   Last Updated: 31-Mar-15 15:22 by Vaughan Basta (MD)

## 2014-08-08 NOTE — Discharge Summary (Signed)
PATIENT NAME:  Dennis Mullen, Dennis Mullen MR#:  [id number] DATE OF BIRTH:  October 24, 1950  DATE OF ADMISSION:  09/29/2013 DATE OF DISCHARGE:  10/01/2013  PRIMARY CARE PHYSICIAN:  Dr. Clayborn Bigness.  FINAL DIAGNOSES: 1.  Acute pancreatitis.  2.  Elevated triglycerides.  3.  History of coronary artery disease.  4.  Diabetes.  5.  Hypertension.  6.  Hyponatremia.  7.  Hypothyroidism.  8.  Obesity.   MEDICATIONS ON DISCHARGE: Include gabapentin 300 mg 1 capsule twice a day, levothyroxine 50 mcg 1 tablet daily, Ambien 10 mg 1/2 to 1 tablet at bedtime as needed for insomnia, aspirin 81 mg daily, Nexium 40 mg daily, Cymbalta 60 mg daily, Toprol-XL 50 mg daily, NovoLog 20 units subcutaneous injection 3 times a day with meals, amlodipine 5 mg daily at bedtime, Depakote 500 mg 2 tablets at bedtime, Glucovance 5/500, 1 tablet 3 times a day with meals, AndroGel pump 1.25 grams applied to affected area daily, Victoza 18 mg/3 mL  subcutaneous injection, 1.8 mg subcutaneous injection daily; allopurinol 100 mg 1 tablet daily, furosemide 20 mg in the morning as needed for swelling, colchicine 0.6 mg twice a day for 3 days as needed for gout flare-up, multivitamin with minimal 1 tablet daily, fish oil 1200 mg twice a day, Plavix 75 mg daily, nitroglycerin 0.4 mg sublingual tablet every 5 minutes as needed for angina, hypertension; Lantus 52 units subcutaneous injection at bedtime, Depakote 500 mg in the morning, gemfibrozil 600 mg 1 tablet twice a day before meals, losartan 100 mg daily. Stop taking Hyzaar.   HOME HEALTH: None.   HOME OXYGEN: None.   DIET: Low-sodium, carbohydrate-controlled diet, regular consistency.   ACTIVITY: As tolerated.   REFERRAL: Dr. Rayann Heman, gastroenterology, in 1 month; 1 to 2 weeks with Dr. Clayborn Bigness.   HOSPITAL COURSE: The patient was admitted 09/29/2013, and discharged 10/01/2013. Came in with abdominal pain, was admitted with suspected acute pancreatitis with a lipase of 694 on presentation.   His triglycerides were elevated at 917. He was given IV fluid hydration, and the patient improved and had no further pain.   HOSPITAL COURSE PER PROBLEM LIST: 1.  For the patient's acute pancreatitis, the patient was given IV fluid hydration during the entire hospital course. Lipase in the 600 range came down with IV fluid hydration. The patient was initially kept n.p.o., put on a liquid diet and then advanced to regular diet. The patient had no further abdominal pain and felt fine, discharged home in stable condition.  2.  Elevated triglycerides are likely the cause of the patient's pancreatitis. The patient was started on gemfibrozil. I asked him to continue on the fish oil. I stopped the Crestor that the patient just recently started. Recheck triglycerides as outpatient.  3.  History of coronary artery disease. The patient on aspirin, Plavix and beta blocker.  4.  Diabetes. When eating, can restart all of his medications including the Glucovance, Victoza. The patient is on higher dose Lantus.  5.  Hypertension. The patient's blood pressure was stable during the hospital course. I did stop the Hyzaar because of his hyponatremia.  6.  Hyponatremia. Sodium was low on presentation. Came back up to 133 upon discharge.  7.  Hypothyroidism, on levothyroxine.  8.  Obesity, BMI 36.5. Weight loss needed.   TIME SPENT ON DISCHARGE: 35 minutes   ____________________________ Tana Conch. Leslye Peer, MD rjw:dmm D: 10/01/2013 17:22:02 ET T: 10/01/2013 19:20:50 ET JOB#: [id number]  cc: Tana Conch. Leslye Peer, MD, <Dictator> Lavera Guise,  MD Arther Dames, MD Marisue Brooklyn MD ELECTRONICALLY SIGNED 10/03/2013 16:18

## 2014-08-08 NOTE — Consult Note (Signed)
Details:   - GI Note:  I have seen Dennis Mullen and agree with Tobe Sos a/p.    he has acute pancreatitis from hypertriglyceridemia.    Would recommend aggressive lipid and triglyceride control.  Would recommend he see an endocronoligist that specializes in lipids given this difficult situation.   He will also need an MRCP in about 6 weeks to f/u this possible pancreatic mass. Suspect this is just inflammation but needs f/u. We have already requested a f/u appt in GI cilnic in about 4 weeks.   Electronic Signatures: Arther Dames (MD)  (Signed 15-Jun-15 16:22)  Authored: Details   Last Updated: 15-Jun-15 16:22 by Arther Dames (MD)

## 2014-08-08 NOTE — H&P (Signed)
PATIENT NAME:  Dennis Mullen, Dennis Mullen MR#:  [id number] DATE OF BIRTH:  1950-06-30  DATE OF ADMISSION:  07/11/2013  IDENTIFYING INFORMATION AND CHIEF COMPLAINT: This is a 64 year old man brought in to the hospital after suicide attempt. The patient's chief complaint: "I was just hopeless."   HISTORY OF PRESENT ILLNESS: Information obtained from the patient and the chart. The patient yesterday called 911 after having taken an overdose himself on a combination of insulin and metoprolol. He says that he felt like his life had become increasingly hopeless, that no one listened to him, and that he would just try and kill himself. He claims the reason that he called 911 was that he did not want anyone else to have to find his dead body. When I asked him if he really expected that he would be dead by the time the ambulance got there, he admitted that he really was not thinking of it in those terms. In any case, he says that he has been feeling more depressed for the last few days. He is chronically down about his medical issues. He sleeps poorly at night, in part because of his sleep apnea. He feels lonely and tired during the day. Energy level is low. Denies hallucinations. Denies psychotic symptoms. He has been compliant with his psychiatric medicine.   PAST PSYCHIATRIC HISTORY: The patient has a history of chronic depression and has been given a diagnosis of bipolar disorder, although it is not clear that he has ever had a manic episode. His presentation fits more with a typical borderline personality disorder kind of feature. He has had suicide attempts in the past. It has been a little while since he has been in the hospital, but it has happened before. He was getting medication management and treatment from me, although he had not been back to see me in over a year until this last week. It was after having seen me a few days ago that he took this overdose. No clear history of psychosis. He does, however, think that  Depakote has been uniquely helpful in treating him, as has the Cymbalta that he is taking.   SUBSTANCE ABUSE HISTORY: Denies any abuse of alcohol or other drugs. No serious history of substance abuse in the past.   SOCIAL HISTORY: Lives by himself. Does have family in the area, however, that he stays in occasional contact with. Retired. Fairly lonely, limited social life.   PAST MEDICAL HISTORY: He has sleep apnea. Also has gout, high blood pressure, hypothyroidism, gastric reflux symptoms, and diabetes. He complains that he does not think that people pay any attention to him about his medical complaints.   CURRENT MEDICATIONS: Aspirin 81 mg a day, insulin 70/30 NovoLog mix 15 units twice a day, pantoprazole 40 mg in the morning, levothyroxine 50 mcg in the morning, hydrochlorothiazide combination 12.5/50 two tablets a day, Cymbalta 60 mg a day, Depakote 1000 mg at night and 500 mg in the morning, allopurinol 300 mg a day.   ALLERGIES: No known drug allergies.   FAMILY HISTORY: No family history noted.   REVIEW OF SYSTEMS: Complains of being depressed. No suicidal ideation currently. No hallucinations. No homicidal ideation. Feels tired and run down, feels hopeless. The rest of the review of systems is negative.   MENTAL STATUS EXAMINATION: Cooperative with the exam. Good eye contact. Psychomotor activity normal. Speech normal rate, tone and volume. Affect is odd, smiling at times, rueful. Mood stated as depressed. Thoughts are organized and lucid. No indication  of delusional thinking. Denies current suicidal intent or plan. Denies hallucinations. Judgment and insight chronically somewhat impaired. Intelligence normal. Fund of knowledge normal. Short and long-term memory intact. Alert and oriented x 4.   PHYSICAL EXAMINATION: GENERAL: The patient does not appear to be in any acute distress.  SKIN: No acute skin lesions.  NEUROMUSCULAR: Pupils equal and reactive. Face symmetric. Neck and back  nontender. Full range of motion at all extremities. Normal gait. Strength and reflexes symmetric throughout. Cranial nerves symmetric and normal.  LUNGS: Clear without wheezes.  HEART: Regular rate and rhythm.  ABDOMEN: Soft, nontender, normal bowel sounds.  VITAL SIGNS: Temperature 97.7, pulse 61, respirations 18, blood pressure 164/94.   LABORATORY RESULTS: Today, his labs show his blood sugar down to 157, sodium 134. Hematology panel unremarkable. On admission, his drug screen was negative. Depakote level 62. Alcohol level negative. Urinalysis: No infection, positive for sugar. Cardiology: Nonspecific ST abnormality.   ASSESSMENT: A 64 year old man with chronic depression, personality disorder features, feeling like he is not being paid enough attention to and that he is hopeless. Made a suicide attempt, but at the same time sabotaged it by calling 911 on himself. The patient is still more depressed and out of control with his behavior, needs hospital level treatment.   TREATMENT PLAN: Stabilized on medicine, he will be transferred to psychiatry today. Continue suicide precautions. Continue current medicine as prescribed, no immediate change to his medicines. Engage him in group and individual therapy. Monitor mood and suicidality. Work on appropriate discharge planning.   DIAGNOSIS, PRINCIPAL AND PRIMARY:  AXIS I: Major depression, recurrent, severe.   SECONDARY DIAGNOSES: AXIS I: Rule out bipolar disorder type II.  AXIS II: Borderline personality disorder.  AXIS III: Diabetes, status post overdose attempts, high blood pressure, hypothyroidism, sleep apnea.  AXIS IV: Moderate to severe from loneliness.  AXIS V: Functioning at time of evaluation 64.     ____________________________ Gonzella Lex, MD jtc:jcm D: 07/11/2013 21:02:27 ET T: 07/11/2013 21:28:00 ET JOB#: [id number]  cc: Gonzella Lex, MD, <Dictator> Gonzella Lex MD ELECTRONICALLY SIGNED 07/14/2013 9:54

## 2014-08-08 NOTE — Discharge Summary (Signed)
PATIENT NAME:  Dennis Mullen, PUSTEJOVSKY MR#:  [id number] DATE OF BIRTH:  1950/06/24  DATE OF ADMISSION:  08/14/2013 DATE OF DISCHARGE:  08/15/2013  ADMISSION DIAGNOSIS: Chest pain.   DISCHARGE DIAGNOSES: 1.  Unstable angina in a patient with known coronary artery disease.  2.  Hyperlipidemia.  3.  Bipolar affective disorder. 4.  Diabetes.  5.  Hypertension. 6.  Hypothyroidism.  CONSULTATIONS: Dr. Saralyn Pilar.   PROCEDURES: The patient underwent a cardiac catheterization on 08/14/2013 which showed normal ejection fraction of 59%. A drug-eluting stent was performed in a 75% lesion in the proximal RCA and a second percutaneous intervention was performed on a 90% lesion in the mid RCA. Distal LAD diffuse 40% stenosis and in the right PDA there was a tubular 50% stenosis.   LABORATORY DATA: Discharge sodium 138, potassium 4, chloride 103, bicarb 25, BUN 17, creatinine 0.92, glucose 173.   Cholesterol 211, triglycerides 530, HDL 24.   Troponin max 0.60.  HOSPITAL COURSE: A 64 year old male who presented with chest pain, shortness of breath and admitted for chest pain. For further details, please refer to the H and P.  1.  Acute coronary syndrome with unstable angina. The patient had chest pain, classic chest pain for cardiac. I asked Dr. Saralyn Pilar to evaluate the patient for cardiac catheterization. The patient underwent a cardiac catheterization and required 2 stents in the RCA, as mentioned above, in the proximal and distal RCA. He will need Plavix, metoprolol and aspirin.  2.  Hyperlipidemia. He cannot take a statin due to his elevation in CPK in the past.  3.  Bipolar affective disorder. The patient was continued on his outpatient medications. 4.  Diabetes, not well controlled. The patient will need close follow.  5.  Hypertension. The patient will continue outpatient medications.  6.  Hypothyroidism. The patient was continued on Synthroid.   DISCHARGE MEDICATIONS: 1.  Gabapentin 300 mg b.i.d.   2.  Synthroid 50 mcg daily.  3.  Ambien 10 mg half tablet at bedtime p.r.n.  4.  Aspirin 81 mg daily.  5.  Nexium 40 mg daily. 6.  Cymbalta 60 mg daily.  7.  Metoprolol 50 mg daily.  8.  NovoLog 20 units t.i.d.  9.  Lantus 40 units at bedtime.  10.  Norvasc 5 mg daily.  11.  Hyzaar 25/100 daily.  12.  Depakote 500 mg 2 tablets daily.  13.  Glucovance 5/500 t.i.d.  14.  AndroGel to effected area daily.  15.  Victoza 1.8 mg subcutaneous daily.  16.  Allopurinol 100 mg daily.  17.  Lasix 20 mg p.r.n.  18.  Colchicine 0.6 mg b.i.d. x3 days p.r.n. for gout flare.  19.  Multivitamin daily.  20.  Fish oil 1200 mg b.i.d.  21.  Depakote 500 mg in the morning.  22.  Plavix 75 mg daily.  23.  Nitroglycerin sublingual p.r.n. chest pain.   DISCHARGE DIET: Low fat, ADA diet.   DISCHARGE ACTIVITY: As tolerated.   DISCHARGE FOLLOWUP: The patient will follow up with Dr. Clayborn Bigness in 1 week as well as Dr. [name] in 1 week.  The patient is medically stable for discharge.   TIME SPENT: 35 minutes.  ____________________________ Donell Beers. Benjie Karvonen, MD spm:sb D: 08/15/2013 12:47:52 ET T: 08/15/2013 13:41:06 ET JOB#: [id number]  cc: Jceon Alverio P. Benjie Karvonen, MD, <Dictator> Corey Skains, MD Lavera Guise, MD Donell Beers Karliah Kowalchuk MD ELECTRONICALLY SIGNED 08/15/2013 17:35

## 2014-08-08 NOTE — H&P (Signed)
PATIENT NAME:  Dennis Mullen, Dennis Mullen MR#:  [id number] DATE OF BIRTH:  09-06-1950  DATE OF ADMISSION:  07/10/2013  REFERRING PHYSICIAN: Marjean Donna, MD  PRIMARY CARE PHYSICIAN: Clayborn Bigness, MD (Been followed by her PA, Leta Baptist)  CHIEF COMPLAINT: Suicide attempt with insulin and metoprolol.   HISTORY OF PRESENT ILLNESS: This is a 64 year old male with history of coronary artery disease with multiple stents in the past, followed by Dr. [name], history of diabetes mellitus on insulin, history of multiple suicidal attempts in the past, history of depression. The patient presents with history of suicide attempt. The patient reports he took 208 units of insulin this evening, insulin 70/30. As well, he reports he took a handful of metoprolol pills and then he called EMS. The patient was found to be slightly hypoglycemic by EMS with fingersticks around 60. He was given 1 amp of D50. Initially upon presentation to ED, his first fingerstick was 82, and so he was started on D10 drip. The patient was not hypotensive, was not noticed to have bradycardia. He cannot mention exactly how many pills he took. The patient's blood work was negative for salicylates and acetaminophen. The patient denies taking any other substances. Hospitalist service was requested to admit the patient.   PAST MEDICAL HISTORY: 1.  Coronary artery disease status post multiple stents in the past.  2.  Hypertension.  3.  Hyperlipidemia.  4.  Insulin-dependent diabetes mellitus.  5.  Bipolar disorder.  6.  Panic attack.  7.  Depression.  8.  Suicide attempt x5 in the past. This makes it number 6. 9.  Hypothyroidism.  10.  MRSA in the past.   PAST SURGICAL HISTORY:  1.  Three cardiac stents in the past and angioplasty.  2.  Tonsillectomy.  3.  Right foot surgery with bone graft.   ALLERGIES: No known drug allergies.   SOCIAL HISTORY: The patient denies any smoking, any alcohol, any illicit drugs.   FAMILY HISTORY: Significant  for coronary artery disease and diabetes mellitus.   HOME MEDICATIONS: 1.  Aspirin 81 mg daily.  2.  Sublingual nitro as needed.  3.  Depakote 500 in the morning, 1000 in the evening.  4.  Cymbalta 60 mg oral daily.  5.  NovoLog sliding scale.  6.  NovoLog mix 70/30 40 units once a day.  7.  Onglyza 5 mg oral daily.  9.  Hydrochlorothiazide/losartan 25/100 mg oral daily.  10.  Metoprolol extended release 50 mg oral daily.  11.  Nexium 40 mg oral daily.  12.  Levothyroxine 50 mcg oral daily. 13.  Vitamin C 500 once a day.   REVIEW OF SYSTEMS: CONSTITUTIONAL: The patient denies fever, chills, fatigue, weakness.  EYES: Denies blurry vision, double vision, inflammation, glaucoma.  ENT: Denies tinnitus, ear pain, hearing loss, epistaxis or discharge.  RESPIRATORY: Denies cough, wheezing, hemoptysis, dyspnea, COPD. CARDIOVASCULAR: Denies chest pain, edema, arrhythmia, palpitations, syncope.  GASTROINTESTINAL: Denies nausea, vomiting, diarrhea, abdominal pain, hematemesis, melena.  GENITOURINARY: Denies dysuria, hematuria, renal colic.  ENDOCRINE: Denies polyuria, polydipsia, heat or cold intolerance.  HEMATOLOGY: Denies anemia, easy bruising, bleeding diathesis.  INTEGUMENT: Denies acne, rash or skin lesion.  MUSCULOSKELETAL: Denies any swelling, gout, arthritis, cramps.  NEUROLOGIC: Denies CVA, TIA, headache, ataxia, vertigo.  PSYCHIATRIC: Reports anxiety. Reports suicidal ideation and thoughts. Reports history of depression. Denies any schizophrenia, any substance abuse, any alcohol abuse.   PHYSICAL EXAMINATION: VITAL SIGNS: Temperature 98.4, pulse 72, respiratory rate 16, blood pressure 150/65, saturating 99% on room air.  GENERAL: Well-nourished male who looks comfortable, in no apparent distress.  HEENT: Head atraumatic, normocephalic. Pupils equal and reactive to light. Pink conjunctivae. Anicteric sclerae. Moist oral mucosa.  NECK: Supple. No thyromegaly. No JVD.  CHEST: Good  air entry bilaterally. No wheezing, rales or rhonchi.  CARDIOVASCULAR: S1, S2 heard. No rubs, murmurs or gallops. Regular rate and rhythm.  ABDOMEN: Soft, nontender, nondistended. Bowel sounds present.  EXTREMITIES: No edema. No clubbing. No cyanosis. Pedal and radial pulses +2 bilaterally.  PSYCHIATRIC: The patient is awake, alert x3, communicative, pleasant.  NEUROLOGIC: Cranial nerves II through XII grossly intact. Power 5/5. No focal deficit. MUSCULOSKELETAL: No joint effusion or erythema.  LYMPHATIC: No cervical lymphadenopathy could be appreciated.   DIAGNOSTIC DATA: Pertinent labs: Glucose 56, BUN 22, creatinine 1.34, sodium 136, potassium 3.5, chloride 101, CO2 29. White blood cells 9.5, hemoglobin 14.7, hematocrit 43.3, platelet 222,000. Salicylates less than 1.7. Acetaminophen less than 2.   EKG is showing normal sinus rhythm at 78 beats per minute.  ASSESSMENT AND PLAN: 1.  Suicide attempt. The patient had suicidal attempt by taking insulin and metoprolol. Initially was hypoglycemic, currently improved on D10. The patient will be kept on suicide precautions. He is currently involuntarily committed. We will consult psychiatric service. Will continue him on his psychiatric meds meanwhile.  2.  Hypoglycemia due to insulin overdose. The patient will hold all his insulin, all his hyperglycemic agents. Will keep him on D10 with fingersticks every 1 hour.  3.  Metoprolol overdose. Currently the patient's blood pressure is acceptable. His heart rate is acceptable. Will admit to CCU with telemetry monitor. If blood pressure drops, or heart rate drops, might need atropine or glucagon drip, but so far his heart rate and blood pressure have been acceptable.  4.  History of coronary artery disease. The patient will be continued on aspirin, he will be continued on statin as well. Denies any chest pain. He will be resumed back on his beta blockers when he is more stable.  5.  Hypertension. Blood  pressure is acceptable. We will hold all meds due to his metoprolol overdose.  6.  Hyperlipidemia. Continue with statin.  7.  Diabetes mellitus. We will hold all hypoglycemic agents due to insulin overdose.  8.  Bipolar disorder, panic disorder, depression, and suicide attempt. We will consult psych.  9.  Hypothyroidism. We will continue Synthroid.  10.  Deep vein thrombosis prophylaxis. Subcutaneous heparin.  TOTAL TIME SPENT ON ADMISSION AND PATIENT CARE: 55 minutes.   ____________________________ Albertine Patricia, MD dse:sb D: 07/10/2013 07:01:44 ET T: 07/10/2013 07:28:52 ET JOB#: [id number]  cc: Albertine Patricia, MD, <Dictator> Mirriam Vadala Graciela Husbands MD ELECTRONICALLY SIGNED 07/11/2013 7:19

## 2014-08-08 NOTE — Consult Note (Signed)
Details:   - GI Note:  Doing well clinically. Abd pain minimal.  Tolerated full liquids today.   Recs; - ok advancing to low fat diet.  - agree wtih likely d/c tomorrow.  - would recommend he see endocrine regarding management of his severely elevated triglycerides in the setting of prev elev CK on statin.  - GI clinic f/u in 1 month, will need repeat imaging at that time.   Electronic Signatures: Arther Dames (MD)  (Signed 16-Jun-15 17:47)  Authored: Details   Last Updated: 16-Jun-15 17:47 by Arther Dames (MD)

## 2014-08-08 NOTE — Consult Note (Signed)
PATIENT NAME:  Dennis Mullen, Dennis Mullen MR#:  [id number] DATE OF BIRTH:  1950-09-04  DATE OF CONSULTATION:  01/26/2014  REFERRING PHYSICIAN:   CONSULTING PHYSICIAN:  Corian Handley S. Gretel Acre, MD  REASON FOR CONSULTATION: "I was feeling sad and overwhelmed."   HISTORY OF PRESENT ILLNESS: The patient is a 64 year old male came to the hospital voluntarily as he has been becoming more depressed, hopeless, helpless, and having suicidal ideations. He reported that he was making a plan to overdose on his prescription medications including insulin. He reported that he has been living by himself and has been becoming progressively depressed and overwhelmed. He has not been taking his medications as prescribed. He has been skipping meals and becoming more depressed probably because of 11 days of no sunshine. Last night he decided to overdose on his insulin as well as metoprolol. However he did not want to get to that point and he called the ER and then drove himself to the hospital. He reported that he has been living alone for the past 7 years. He currently describes feeling depressed, hopeless, helpless, anhedonia, and not able to sleep. The patient reported that he is so tired of battling and fighting to the point of giving up. The patient currently denied having any paranoia or anxiety. He has been compliant with his psychiatric medication and follows with Dr. Weber Cooks on a regular basis.   PAST PSYCHIATRIC HISTORY: The patient has history of chronic depression, as well as bipolar disorder. He has been following with Miguel Dibble, but she is on leave and he is unable to see his therapist. He has also been diagnosed with borderline personality disorder. The patient has a history of multiple suicide attempts in the past.   SUBSTANCE ABUSE HISTORY: The patient currently denied using any drugs or alcohol.   SOCIAL HISTORY: The patient is currently living by himself for the past 7 years. He does not have any contact with his  family members.   PAST MEDICAL HISTORY: Diabetes, gout, hypertension, hypothyroidism, gastroesophageal reflux disease.    CURRENT MEDICATIONS: Aspirin 81 mg daily, insulin NovoLog, pantoprazole, levothyroxine, hydrochlorothiazide, Cymbalta, Depakote, and allopurinol.    ALLERGIES: No known drug allergies.   FAMILY HISTORY;  None reported.    REVIEW OF SYSTEMS:    CONSTITUTIONAL: Denies any fever or chills. No weight changes.  EYES: No double or blurred vision.  RESPIRATORY: No shortness of breath or cough.  CARDIOVASCULAR: No chest pain or orthopnea.   GASTROINTESTINAL:  No abdominal pain, nausea, vomiting or diarrhea.  GENITOURINARY: No incontinence or frequency.  ENDOCRINE: No heat or cold intolerance.  LYMPHATIC: No anemia or easy bruising.  INTEGUMENTARY: No acne or rash.  MUSCULOSKELETAL: No muscle or joint pain.   VITAL SIGNS: Temperature 98.3, pulse 71, respirations 20, blood pressure 149/78.   MENTAL STATUS EXAMINATION: The patient is a moderately built male who was sitting in the bed. He maintained fair eye contact. His speech was low in tone and volume. Mood was depressed. Affect was congruent. Thought process was logical, goal-directed. Thought content was non-delusional. He admits to feeling depressed and mood was depressed and affect was congruent. His insight and judgment were fair. He was awake, alert, and oriented x 3. Recent and remote memory were intact. Attention span and concentration were normal. Thought process was logical, goal-directed. No loose associations are noted. Thought content was non-delusional. He admits to having suicidal ideation with a plan.   DIAGNOSTIC IMPRESSION:  AXIS I: Bipolar disorder, severe, without psychotic features.  AXIS II: Borderline personality disorder.   AXIS III:  1.  Diabetes 2.  Status post overdose attempts in the past.  3.  Hypertension.  4.  Hypothyroidism.  5.  Sleep apnea.   AXIS IV: Severe.   AXIS V: Current  global assessment of functioning 25.   TREATMENT PLAN:  1.  The patient will be admitted to the inpatient behavioral health unit for stabilization and safety.  2.  He will be continued on suicide precautions.  3.  He will be continued on his psychotropic medication as prescribed.  4.  He will be evaluated by the treatment team and his medications will be adjusted.   Thank you for allowing me to participate in the care of this patient.    ____________________________ Cordelia Pen. Gretel Acre, MD usf:bu D: 01/26/2014 13:48:08 ET T: 01/26/2014 15:51:27 ET JOB#: [id number]  cc: Cordelia Pen. Gretel Acre, MD, <Dictator> Jeronimo Norma MD ELECTRONICALLY SIGNED 02/02/2014 9:58

## 2014-08-08 NOTE — Consult Note (Signed)
PATIENT NAME:  Dennis Mullen, Dennis Mullen MR#:  [id number] DATE OF BIRTH:  [birth year]  DATE OF CONSULTATION:  01/27/2014   CONSULTING PHYSICIAN:  Jeffrey Graefe R. Ruble Pumphrey, MD   PRIMARY CARE PROVIDER: Clayborn Bigness, MD  PRIMARY ENDOCRINOLOGIST:  Adella Hare, MD at [facility] REQUESTING PHYSICIAN:  Orson Slick, MD   REASON FOR CONSULTATION: Diabetes, hypertension.   HISTORY OF PRESENT ILLNESS: A 64 year old Caucasian male patient with history of insulin-dependent diabetes mellitus on an insulin pump and hypertension presented to the Emergency Room complaining of feeling sad, overwhelmed with suicidal ideation and was admitted to behavioral health unit.  Per the behavioral health unit's policy, an insulin pump is not used here and the hospitalist team has been consulted for further recommendations.   The patient's blood sugars seem to be fairly controlled on his present sliding scale, but his blood sugar is greater than 250.   Hospitalist team was consulted.   The patient was started on a V-Go 40 pump 1 month back and his blood sugars have been fairly controlled between 100 to 200 which he has been happy with.  He mentioned that his diet is no different here compared to home at this point.  The patient seems to be well aware of his history.   PAST MEDICAL HISTORY:  1.  Insulin-dependent diabetes mellitus.  2.  Hypertension.  3.  GERD.  4.  Depression.  5.  Gout.  6.  Hypothyroidism.   SOCIAL HISTORY:  The patient lives alone. Does not smoke. No alcohol. No illicit drug use.   CODE STATUS: FULL CODE.   ALLERGIES: No known drug allergies.   FAMILY HISTORY:  No chronic medical conditions in his parents.   REVIEW OF SYSTEMS:   CONSTITUTIONAL: No fever, fatigue.  EYES: No blurred vision, pain, redness.  ENT: No tinnitus, ear pain, hearing loss.  RESPIRATORY: No cough, wheeze, hemoptysis.  CARDIOVASCULAR: No chest pain, orthopnea, edema.  GASTROINTESTINAL: No nausea, vomiting,  diarrhea, abdominal pain.  GENITOURINARY: No dysuria, hematuria, frequency.  ENDOCRINE: No polyuria, nocturia, thyroid problems.  HEMATOLOGIC AND LYMPHATIC:  No anemia, easy bruising, bleeding.  INTEGUMENTARY: No acne, rash, seems to have some folliculitis on trunk MUSCULOSKELETAL: No joint swelling, back pain.  NEUROLOGIC: No focal numbness, weakness, seizure.  PSYCHIATRIC: Has depression, suicidal ideation.   HOME MEDICATIONS:  1.  Actos 15 mg oral once a day.  2.  Allopurinol 100 mg daily.  3.  Ambien 10 mg 0.5 to 1 tablet daily at bedtime as needed.  4.  Amlodipine 5 mg daily.  5.  AndroGel apply topically to effected area once a day.  6.  Aspirin 81 mg daily.  7.  Plavix 75 mg daily. 8.  Colcrys  0.6 mg oral 2 times a day for 3 days as needed for gout.  9.  Crestor 10 mg daily.  10. Cymbalta 60 mg daily.  11. Depakote 5 mg extended release once a day.  12. Depakote 5 mg oral extended-release 2 tablets once a day.  13. Lasix 20 mg once a day as needed in the morning for swelling.  14. Gabapentin 300 mg 2 times a day. 15. Gemfibrozil 600 mg oral 2 times a day.  16. Invokana 300 mg oral once a day.  17. Levothyroxine 50 mcg oral once a day.  18. Losartan 100 mg daily.  19. Nexium 40 mg daily.  20. Nitroglycerin 0.4 sublingual as needed for chest pain.  21. NovoLog insulin pump 1.67 units subcutaneous hourly  and boluses with meals.  22. Toprol-XL 50 mg daily.  23. Victoza 1.8 mg subcutaneous once a day.   PHYSICAL EXAMINATION:  VITAL SIGNS: Temperature 98.1, pulse 63, respirations 20, blood pressure 158/85, saturating 99%.  GENERAL: Obese, Caucasian male patient sitting in his bed, seems comfortable, conversational, cooperative with exam.  PSYCHIATRIC: Alert and oriented x 3, pleasant.  HEENT: Atraumatic, normocephalic. Oral mucosa moist and pink. External ears and nose normal. No pallor. No icterus. Pupils bilaterally equal and reactive to light.  NECK: Supple. No thyromegaly  or palpable lymph nodes. Trachea midline. No carotid bruit, JVD.  CARDIOVASCULAR: S1, S2, without any murmurs. Peripheral pulses 2+. No edema.  RESPIRATORY: Normal work of breathing. Clear to auscultation both sides. GASTROINTESTINAL:  Soft abdomen, nontender. Bowel sounds present. No organomegaly palpable.  SKIN: Warm and dry. No petechiae, rash, has some lesions of folliculitis on his trunk and limbs with no pain or discharge.  MUSCULOSKELETAL: No joint swelling, redness, effusion of the large joints.  Normal muscle tone.  NEUROLOGICAL: Motor strength 5/5 in upper and lower extremities. Sensation is intact all over.  LYMPHATIC: No cervical lymphadenopathy.   LABORATORY STUDIES: Show glucose 123,  BUN 20, creatinine 1.36, sodium 138, potassium 4.7. Blood sugars have been 66,125,119, 113,110, 254 and 186.   Urine drug screen negative.  WBC 8.7, hemoglobin 14.7, platelets 276,000.   ASSESSMENT AND PLAN:  1.  Insulin-dependent diabetes mellitus on an insulin pump.  Unable to do an insulin pump here at behavioral health unit.  I discussed with the patient regarding his sliding scale insulin prior to starting his insulin pump.  We will put him on the same scale.  If his blood sugars are still uncontrolled in spite of this sliding scale, would recommend that we consult endocrine for further input regarding this case: Continue the patient's Actos, metformin, and Invokana long with the Victoza.  Continue sliding scale insulin.   2.  Hypertension, mildly elevated. Can continue home medications and monitor closely.  We will make changes as needed.  3.  Depression with suicidal ideation, management as per psychiatry.   4.  Folliculitis. The patient is applying triple antibiotic cream, which he can continue at this point.   Thank you for the consult.  Time spent today on this consult was 55 minutes.     ____________________________ Leia Alf Skai Lickteig, MD srs:DT D: 01/27/2014 16:34:34 ET T: 01/27/2014  17:07:14 ET JOB#: [id number]  cc: Alveta Heimlich R. Darvin Neighbours, MD, <Dictator> Lavera Guise, MD Bhakti B. Eddie Dibbles, MD  Neita Carp MD ELECTRONICALLY SIGNED 01/28/2014 17:05

## 2014-08-08 NOTE — Discharge Summary (Signed)
PATIENT NAME:  Dennis Mullen, FETTIG MR#:  [id number] DATE OF BIRTH:  [birth year]  DATE OF ADMISSION:  07/11/2013 DATE OF DISCHARGE:  07/15/2013  HOSPITAL COURSE: See dictated history and physical for details of admission. A 64 year old man with a history of depression, anxiety, and mood instability,  was transferred to our unit from the medicine service after recovering from an overdose of insulin. On the psychiatry ward the patient was initially more depressed and hopeless, but his mood has improved. The last couple of days he has been saying his mood felt good. He denies any suicidal ideation currently. He has improved insight into hopeful prospects for the future. He has been cooperative with treatment, taking care of himself well, coming out of his room and participating in groups. Blood sugars continue to have poor control, but it is getting gradually better. Medically, he appears to be stronger and more stable. The patient is lucid and agrees to outpatient treatment. He agrees to a recommendation that he continue seeing a therapist, as well as seeing me for medication management. He will be discharged today with follow-up arranged in the community. He agrees to this plan.   MENTAL STATUS EXAM AT DISCHARGE: Neatly dressed and groomed man who looks his stated age, cooperative with the interview. Good eye contact. Normal psychomotor activity. Speech normal rate, tone and volume. Affect euthymic, reactive, appropriate. Mood stated as good. Thoughts are lucid. No loosening of associations or delusions. Denies auditory or visual hallucinations. Denies suicidal or homicidal ideation. Shows improved insight and judgment. Short and long-term memory intact. Normal fund of knowledge.   DISCHARGE MEDICATIONS: Victoza 1.8 mg subcutaneous once a day, pantoprazole 40 mg once a day, glyburide metformin 5/500 mg combination 1 tablet 3 times a day, insulin short-acting sliding-scale as needed. Gabapentin 300 mg twice a day,  levothyroxine 50 mcg per day, aspirin 81 mg per day, allopurinol 300 mg once a day, hydrochlorothiazide losartan combination 25/100 once a day,  70/30 mix insulin 52 units at supper and 40 units at lunch, duloxetine 60 mg once a day, Depakote 500 mg in the morning and 1000 mg at night, nitroglycerin 0.4 mg sublingual as needed for chest pain.   LABORATORY RESULTS: Blood sugars throughout his hospital stay have gone up and down. Sometimes up into the 300s, sometimes as low as 48. He is getting better control of it and is convinced that he knows how to manage it at home. On the 27th, his glucose was still elevated at 157 on a blood draw.  Sodium low 134, anion gap slightly low at 5. CBC normal.   DISPOSITION: Discharge home. Follow up with me for medication management and with outpatient therapy.   DIAGNOSIS, PRINCIPAL AND PRIMARY:  AXIS I: Major depression, recurrent, severe.   SECONDARY DIAGNOSES: AXIS I: No further.  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  AXIS III: Diabetes, hypertension, hypothyroidism, gout, chronic anxiety, gastric reflux.  AXIS IV: Severe from living alone.  AXIS V: Functioning at time of discharge: 55.  ____________________________ Gonzella Lex, MD jtc:sg D: 07/15/2013 12:18:23 ET T: 07/15/2013 12:52:56 ET JOB#: [id number]  cc: Gonzella Lex, MD, <Dictator> Gonzella Lex MD ELECTRONICALLY SIGNED 07/15/2013 17:13

## 2014-08-08 NOTE — Consult Note (Signed)
PATIENT NAME:  Dennis Mullen, Dennis Mullen MR#:  [id number] DATE OF BIRTH:  [birth year]  DATE OF CONSULTATION:  09/29/2013  REFERRING PHYSICIAN:  Albertine Patricia, MD CONSULTING PHYSICIAN:  Corky Sox. Zettie Pho, PA-C ATTENDING GASTROENTEROLOGIST: Arther Dames, MD  REASON FOR CONSULT: Acute pancreatitis.   HISTORY OF PRESENT ILLNESS: This is a pleasant 64 year old gentleman who was initially admitted with acute onset epigastric abdominal pain and was later found to have acute pancreatitis. His lipase is 694, and CT scan of the abdomen and pelvis did suggest mild acute pancreatitis with focal soft tissue inflammation by the body of the pancreas. There was an area of 1.8 cm focus of decreased attenuation suggesting focal devascularization versus mass. Since being admitted, he was started on IV fluids and kept on a clear liquid diet. He is denying any nausea or vomiting. He continues to have epigastric abdominal pain unchanged in severity since admission. He did tolerate part of his clear liquid diet this morning, including Jell-O and some sips of broth, but he could not finish it due to a lack of appetite. He admits that it was not making the pain worse. He just was not hungry for it. He denies any alcohol intake. There are no signs of gallstones on imaging, and his liver enzymes are within normal limits. Of note, however, his total cholesterol is 299, and his triglycerides are markedly elevated at 917. He was recently admitted in late April and discharged May 1, where he had 2 cardiac stents placed. He admits that for some reason he was taken off of his Crestor medication and just resumed Crestor at a lower dose of 10 mg just a few days ago. No unintentional weight changes. No chest pain or shortness of breath. No black or bloody stools.   ALLERGIES: No known drug allergies.   PAST MEDICAL HISTORY: Coronary artery disease with a total of 5 stents (most recently, 2 stents were placed in April 2015), history of diabetes  mellitus, hypertension, hypothyroidism, dyslipidemia, bipolar disorder, depression, anxiety, diverticulosis, history of previous suicide attempts, gout.   PAST SURGICAL HISTORY: Five cardiac stents (2 of which were recently placed just less than 2 months ago), tonsillectomy, right foot surgery with bone graft, right mastoidectomy, penile implant.   SOCIAL HISTORY: The patient denies any alcohol, tobacco or illicit drug use.   FAMILY HISTORY: There is no known family history of GI malignancy, colon polyps or IBD.   HOME MEDICATIONS: Allopurinol, Ambien, amlodipine, AndroGel,  aspirin, colchicine, Cymbalta, Depakote, fish oil, Lasix, gabapentin, Glucovance, Hyzaar, Lantus, levothyroxine, multivitamin, Toprol, NovoLog, and Victoza.   REVIEW OF SYSTEMS: Ten-system review of systems was obtained on the patient. Pertinent positives are mentioned above and otherwise negative.   OBJECTIVE: VITAL SIGNS: Blood pressure 157/73, heart rate 80, respirations 16, temperature 99.1, bedside pulse oximetry 94%.  GENERAL: This is a pleasant 64 year old gentleman resting quietly and comfortably in bed in no acute distress. Alert and oriented x 3.  HEAD: Atraumatic, normocephalic.  NECK: Supple. No lymphadenopathy noted.  HEENT: Sclerae are anicteric. Mucous membranes moist.  LUNGS: Respirations are even and unlabored. Clear to auscultation bilateral anterior lung fields.  HEART: Regular rate and rhythm. S1, S2 noted.  ABDOMEN: Soft, mildly tender to palpation in the epigastric region, without guarding or rebound. No masses, hernias or organomegaly appreciated. Normoactive bowel sounds are noted in all 4 quadrants.  RECTAL: Deferred.  PSYCHIATRIC: Appropriate mood and affect.  NEUROLOGIC: Cranial nerves II through XII are grossly intact.   LABORATORY DATA: White blood  cells 12.8, hemoglobin 13.4, hematocrit 41.9, platelets 219, MCV 92. Sodium 129, potassium 4.7, BUN 31, creatinine 1.35, glucose 346. Lipase 694,  bilirubin 0.5, alkaline phosphatase 99, ALT 43, AST 36, albumin 3.3. Triglycerides are 917, total cholesterol 299.   IMAGING: CT of the abdomen and pelvis with contrast was obtained on the patient showing mild acute pancreatitis with focal soft tissue inflammation by the body of the pancreas. There is a 1.8 cm focus of decreased attenuation suggesting focal devascularization versus mass. Consider MRCP once pancreatitis settles.   ASSESSMENT: 1.  Acute pancreatitis.  2.  Hypertriglyceridemia.  3.  Epigastric abdominal pain.  4.  Abnormal CT scan.   PLAN: I have discussed this patient's case in detail with Dr. Arther Dames who is involved in the development of the patient's plan of care. We have reviewed his labs and CT scan, and the overall clinical picture is suggestive of an acute pancreatitis. We do agree with keeping the patient on a clear liquid diet. We do not feel the need to advance quite yet, as he still is not eating full meal at this point. He is still having abdominal pain and the loss of appetite. Continue symptomatic management. His triglycerides were markedly elevated at 917 and with the absence of gallstones on imaging, normal LFTs, and the absence of alcohol intake, this is likely the etiology. An abdominal ultrasound is pending, however, and we will await these findings. Otherwise, he will need more aggressive control of his triglycerides as an outpatient to prevent recurrence. We do recommend that he follow up in the office in about 4 to 6 weeks' time so we can order repeat imaging, likely an MRCP, to further evaluate the 1.8 cm focus mentioned on CT scan. The patient verbalized understanding, and all questions were answered. We will continue to follow this patient.   Thank you so much for this consultation and for allowing [place] to participate in the patient's plan of care.   ATTENDING GASTROENTEROLOGIST: Arther Dames, MD   ____________________________ Corky Sox. Janene Yousuf,  PA-C kme:jcm D: 09/29/2013 15:41:00 ET T: 09/29/2013 17:15:49 ET JOB#: [id number]  cc: Corky Sox. Huxley Vanwagoner, PA-C, <Dictator> [place] PA ELECTRONICALLY SIGNED 09/30/2013 10:04

## 2014-08-08 NOTE — Consult Note (Signed)
Brief Consult Note: Diagnosis: Major depression.   Patient was seen by consultant.   Recommend further assessment or treatment.   Discussed with Attending MD.   Comments: PsychiatrY: PAtient known to me. History of depression or possibly atypical bipolar disorder and personality disorder. Made a suicide attempt. Needs admission to [facility]. Medically stable. Can be transfered to behavioral health unit. Will call intake nurse.Will dictate complete note as H&P.  Electronic Signatures: Gonzella Lex (MD)  (Signed 27-Mar-15 15:12)  Authored: Brief Consult Note   Last Updated: 27-Mar-15 15:12 by Gonzella Lex (MD)

## 2014-08-14 NOTE — Patient Outreach (Signed)
[facility]) Care Management  08/14/2014  Gustave Lindeman [facility] 05-02-1950 [id number]   Care plan updated.   [name] [facility] Care Management [phone]

## 2014-09-15 ENCOUNTER — Other Ambulatory Visit: Payer: Self-pay | Admitting: *Deleted

## 2014-09-15 NOTE — Patient Outreach (Signed)
[facility]) Care Management  09/15/2014  Burk Hoctor [facility] July 21, 1950 [id number]  Phone call to patient to follow up on referral made to [facility] program.  Per patient, he has not received a call yet.  Patient states, however that his mood is stable at this time.  No questions or concerns.  This Education officer, museum will follow up on referral to the life sync program.   [name] [facility] Care Management [phone]

## 2014-10-26 ENCOUNTER — Other Ambulatory Visit: Payer: Self-pay

## 2014-10-26 ENCOUNTER — Encounter: Payer: Self-pay | Admitting: Psychiatry

## 2014-10-26 ENCOUNTER — Ambulatory Visit (INDEPENDENT_AMBULATORY_CARE_PROVIDER_SITE_OTHER): Payer: Medicare PPO | Admitting: Psychiatry

## 2014-10-26 VITALS — BP 118/86 | HR 66 | Temp 97.8°F | Ht >= 80 in | Wt 282.8 lb

## 2014-10-26 DIAGNOSIS — F3181 Bipolar II disorder: Secondary | ICD-10-CM | POA: Diagnosis not present

## 2014-10-26 MED ORDER — DIVALPROEX SODIUM ER 500 MG PO TB24: 500.0000 mg | ORAL_TABLET | Freq: Three times a day (TID) | ORAL | Status: DC

## 2014-10-26 MED ORDER — DULOXETINE HCL 60 MG PO CPEP: 60.0000 mg | ORAL_CAPSULE | Freq: Every day | ORAL | Status: DC

## 2014-10-27 DIAGNOSIS — F3181 Bipolar II disorder: Secondary | ICD-10-CM | POA: Insufficient documentation

## 2014-10-27 NOTE — Progress Notes (Signed)
BH MD/PA/NP OP Progress Note  10/27/2014 9:49 AM Dennis Mullen  MRN:  [id number]  Subjective:  Patient returns for service in the first time in over a year. No acute mood complaints. Feeling good. No psychosis. Chief Complaint:  desire to continue medication management Visit Diagnosis:     ICD-9-CM ICD-10-CM   1. Bipolar 2 disorder 296.89 F31.81     Past Medical History:  Past Medical History  Diagnosis Date  . Obesity   . Diabetes mellitus, type II   . Thyroid disease   . Anxiety   . Bipolar disorder   . History of borderline personality disorder   . Depression   . Neuropathy     Past Surgical History  Procedure Laterality Date  . Tonsillectomy and adenoidectomy    . Nasal septum surgery    . Mastoidectomy Right   . Penial inplant     Family History:  Family History  Problem Relation Age of Onset  . Lung cancer Mother   . Heart attack Father   . Post-traumatic stress disorder Father   . Anxiety disorder Father   . Depression Father   . Heart attack Sister   . Heart attack Brother    Social History:  History   Social History  . Marital Status: Unknown    Spouse Name: N/A  . Number of Children: N/A  . Years of Education: N/A   Social History Main Topics  . Smoking status: Never Smoker   . Smokeless tobacco: Never Used  . Alcohol Use: No  . Drug Use: No  . Sexual Activity: Not Currently    Birth Control/ Protection: None   Other Topics Concern  . None   Social History Narrative  . None   Additional History: In the last year he has had one or 2 episodes of what might be a dysphoric mania with irritability. He has also had significant medical problems with a likely myocardial infarction and placement of cardiac stents. Medications throughout of been stable. Mood has been stable. No suicidal behavior or thinking currently. No psychotic symptoms  Assessment: Bipolar disorder type II currently stable tolerating medication  well  Musculoskeletal: Strength & Muscle Tone: within normal limits Gait & Station: normal Patient leans: N/A  Psychiatric Specialty Exam: HPI  ROS  Blood pressure 118/86, pulse 66, temperature 97.8 F (36.6 C), temperature source Tympanic, height 6' 8"  (2.032 m), weight 282 lb 12.8 oz (128.277 kg), SpO2 95 %.Body mass index is 31.07 kg/(m^2).  General Appearance: Casual  Eye Contact:  Good  Speech:  Clear and Coherent  Volume:  Normal  Mood:  Euthymic  Affect:  Congruent  Thought Process:  Goal Directed  Orientation:  Full (Time, Place, and Person)  Thought Content:  Negative  Suicidal Thoughts:  No  Homicidal Thoughts:  No  Memory:  Immediate;   Good Remote;   Good  Judgement:  Good  Insight:  Present  Psychomotor Activity:  Normal  Concentration:  Good  Recall:  Good  Fund of Knowledge: Good  Language: Good  Akathisia:  No  Handed:  Right  AIMS (if indicated):     Assets:  Communication Skills Desire for Improvement Financial Resources/Insurance Housing Resilience  ADL's:  Intact  Cognition: WNL  Sleep:      Is the patient at risk to self?  No. Has the patient been a risk to self in the past 6 months?  No. Has the patient been a risk to self within the distant past?  Yes.   Is the patient a risk to others?  No. Has the patient been a risk to others in the past 6 months?  No. Has the patient been a risk to others within the distant past?  No.  Current Medications: Current Outpatient Prescriptions  Medication Sig Dispense Refill  . ACCU-CHEK SMARTVIEW test strip     . allopurinol (ZYLOPRIM) 100 MG tablet     . amLODipine (NORVASC) 5 MG tablet     . aspirin 81 MG tablet Take 81 mg by mouth daily.    . clopidogrel (PLAVIX) 75 MG tablet     . colchicine 0.6 MG tablet Take 0.6 mg by mouth daily.    . CRESTOR 10 MG tablet     . diclofenac (VOLTAREN) 75 MG EC tablet Take 75 mg by mouth 2 (two) times daily.    . divalproex (DEPAKOTE ER) 500 MG 24 hr tablet Take  1 tablet (500 mg total) by mouth 3 (three) times daily between meals. 90 tablet 5  . DULoxetine (CYMBALTA) 60 MG capsule Take 1 capsule (60 mg total) by mouth daily. 30 capsule 5  . esomeprazole (NEXIUM) 40 MG capsule     . gemfibrozil (LOPID) 600 MG tablet     . Insulin Disposable Pump (V-GO 30) KIT     . INVOKANA 300 MG TABS tablet     . levothyroxine (SYNTHROID, LEVOTHROID) 50 MCG tablet     . losartan (COZAAR) 100 MG tablet     . LYRICA 100 MG capsule     . metFORMIN (GLUCOPHAGE) 1000 MG tablet     . metoprolol succinate (TOPROL-XL) 50 MG 24 hr tablet     . mupirocin ointment (BACTROBAN) 2 %     . NITROSTAT 0.4 MG SL tablet     . NOVOLOG 100 UNIT/ML injection     . pioglitazone (ACTOS) 15 MG tablet     . Testosterone (ANDROGEL PUMP TD) Place onto the skin.    Marland Kitchen VICTOZA 18 MG/3ML SOPN     . cephALEXin (KEFLEX) 250 MG capsule     . levofloxacin (LEVAQUIN) 750 MG tablet      No current facility-administered medications for this visit.    Medical Decision Making:  Established Problem, Stable/Improving (1), Review of Psycho-Social Stressors (1), Review and summation of old records (2), Review of Medication Regimen & Side Effects (2) and Review of New Medication or Change in Dosage (2)  Treatment Plan Summary:Medication management and Plan Tolerating medicine well. Reviewed current medication primarily Depakote and Cymbalta. No sign of any side effects. Renew current medication. We can recheck levels in another few months. Side effects reviewed. Patient agreeable to the plan. Chart reviewed. He knows he can contact me sooner. Supportive counseling completed   Alethia Berthold 10/27/2014, 9:49 AM

## 2014-11-04 ENCOUNTER — Encounter: Payer: Self-pay | Admitting: *Deleted

## 2014-11-04 ENCOUNTER — Other Ambulatory Visit: Payer: Self-pay | Admitting: *Deleted

## 2014-11-04 DIAGNOSIS — E782 Mixed hyperlipidemia: Secondary | ICD-10-CM | POA: Insufficient documentation

## 2014-11-04 DIAGNOSIS — I10 Essential (primary) hypertension: Secondary | ICD-10-CM | POA: Insufficient documentation

## 2014-11-04 NOTE — Patient Outreach (Signed)
[place] [facility]) Care Management  11/04/2014  Dennis Mullen [facility] 1950-09-18 [id number]   Phone call to [name] [phone] to confirm that patient had been assigned a behavioral health case manager.  Per [name] after several attempts to reach patient, contact was made on 10/27/14.  Case manager assigned.   Case to be closed to social work .   [name] [facility] Care Management [phone]

## 2014-11-04 NOTE — Patient Outreach (Signed)
[facility]) Care Management  11/04/2014  Dennis Mullen [facility] 05-28-1950 [id number]   Patient's case closed to social work. Case closure letter to be routed to patient and patient's provider.    [name] [facility] Care Management [phone]

## 2014-11-05 DIAGNOSIS — R42 Dizziness and giddiness: Secondary | ICD-10-CM | POA: Insufficient documentation

## 2014-11-10 ENCOUNTER — Emergency Department
Admission: EM | Admit: 2014-11-10 | Discharge: 2014-11-10 | Disposition: A | Discharge status: Home / Self Care | Payer: Medicare PPO | Attending: Emergency Medicine | Admitting: Emergency Medicine

## 2014-11-10 ENCOUNTER — Encounter: Payer: Self-pay | Admitting: Emergency Medicine

## 2014-11-10 ENCOUNTER — Other Ambulatory Visit: Payer: Self-pay

## 2014-11-10 DIAGNOSIS — E875 Hyperkalemia: Secondary | ICD-10-CM | POA: Diagnosis not present

## 2014-11-10 DIAGNOSIS — R799 Abnormal finding of blood chemistry, unspecified: Secondary | ICD-10-CM | POA: Diagnosis present

## 2014-11-10 DIAGNOSIS — E119 Type 2 diabetes mellitus without complications: Secondary | ICD-10-CM | POA: Insufficient documentation

## 2014-11-10 LAB — BASIC METABOLIC PANEL
Anion gap: 9 (ref 5–15)
Anion gap: 7 (ref 5–15)
BUN: 29 mg/dL — ABNORMAL HIGH (ref 6–20)
BUN: 27 mg/dL — ABNORMAL HIGH (ref 6–20)
CALCIUM: 8.9 mg/dL (ref 8.9–10.3)
CHLORIDE: 103 mmol/L (ref 101–111)
CO2: 23 mmol/L (ref 22–32)
CO2: 25 mmol/L (ref 22–32)
CREATININE: 1.12 mg/dL (ref 0.61–1.24)
Calcium: 9 mg/dL (ref 8.9–10.3)
Chloride: 103 mmol/L (ref 101–111)
Creatinine, Ser: 1.26 mg/dL — ABNORMAL HIGH (ref 0.61–1.24)
GFR calc Af Amer: >60 mL/min (ref >60)
GFR calc Af Amer: >60 mL/min (ref >60)
GFR calc non Af Amer: 59 mL/min — ABNORMAL LOW (ref >60)
GLUCOSE: 71 mg/dL (ref 65–99)
Glucose, Bld: 62 mg/dL — ABNORMAL LOW (ref 65–99)
POTASSIUM: 5.4 mmol/L — AB (ref 3.5–5.1)
Potassium: 5.5 mmol/L — ABNORMAL HIGH (ref 3.5–5.1)
Sodium: 135 mmol/L (ref 135–145)
Sodium: 135 mmol/L (ref 135–145)

## 2014-11-10 LAB — CBC
HEMATOCRIT: 39.2 % — AB (ref 40.0–52.0)
Hemoglobin: 13 g/dL (ref 13.0–18.0)
MCH: 30.7 pg (ref 26.0–34.0)
MCHC: 33.1 g/dL (ref 32.0–36.0)
MCV: 92.7 fL (ref 80.0–100.0)
Platelets: 191 10*3/uL (ref 150–440)
RBC: 4.22 MIL/uL — AB (ref 4.40–5.90)
RDW: 17.1 % — ABNORMAL HIGH (ref 11.5–14.5)
WBC: 6.6 10*3/uL (ref 3.8–10.6)

## 2014-11-10 MED ORDER — SODIUM CHLORIDE 0.9 % IV SOLN
Freq: Once | INTRAVENOUS | Status: AC
  Administered 2014-11-10: 19:00:00 via INTRAVENOUS

## 2014-11-10 NOTE — ED Provider Notes (Addendum)
[facility] Emergency Department Provider Note     Time seen: ----------------------------------------- 6:17 PM on 11/10/2014 -----------------------------------------    I have reviewed the triage vital signs and the nursing notes.   HISTORY  Chief Complaint Abnormal Lab    HPI Dennis Mullen is a 64 y.o. male who presents to ER for high potassium. Patient had outpatient lab work done by Dr. Sharol Roussel office and was noted to have elevated potassium. He sent to the ER for evaluation. Patient denies any complaints currently, states his blood sugars have been low and his been adjusting his insulin accordingly.   Past Medical History  Diagnosis Date  . Obesity   . Diabetes mellitus, type II   . Thyroid disease   . Anxiety   . Bipolar disorder   . History of borderline personality disorder   . Depression   . Neuropathy     Patient Active Problem List   Diagnosis Date Noted  . Bipolar 2 disorder 10/27/2014    Past Surgical History  Procedure Laterality Date  . Tonsillectomy and adenoidectomy    . Nasal septum surgery    . Mastoidectomy Right   . Penial inplant      Allergies Review of patient's allergies indicates no known allergies.  Social History History  Substance Use Topics  . Smoking status: Never Smoker   . Smokeless tobacco: Never Used  . Alcohol Use: No    Review of Systems Constitutional: Negative for fever. Eyes: Negative for visual changes. ENT: Negative for sore throat. Cardiovascular: Negative for chest pain. Respiratory: Negative for shortness of breath. Gastrointestinal: Negative for abdominal pain, vomiting and diarrhea. Genitourinary: Negative for dysuria. Musculoskeletal: Negative for back pain. Skin: Negative for rash. Neurological: Negative for headaches, focal weakness or numbness.  10-point ROS otherwise negative.  ____________________________________________   PHYSICAL EXAM:  VITAL SIGNS: ED  Triage Vitals  Enc Vitals Group     BP 11/10/14 1710 146/67 mmHg     Pulse Rate 11/10/14 1710 63     Resp 11/10/14 1710 20     Temp 11/10/14 1710 98.5 F (36.9 C)     Temp Source 11/10/14 1710 Oral     SpO2 11/10/14 1710 99 %     Weight 11/10/14 1710 287 lb (130.182 kg)     Height 11/10/14 1710 6\' 2"  (1.88 m)     Head Cir --      Peak Flow --      Pain Score --      Pain Loc --      Pain Edu? --      Excl. in [place]? --     Constitutional: Alert and oriented. Well appearing and in no distress. Eyes: Conjunctivae are normal. PERRL. Normal extraocular movements. ENT   Head: Normocephalic and atraumatic.   Nose: No congestion/rhinnorhea.   Mouth/Throat: Mucous membranes are moist.   Neck: No stridor. Cardiovascular: Normal rate, regular rhythm. Normal and symmetric distal pulses are present in all extremities. No murmurs, rubs, or gallops. Respiratory: Normal respiratory effort without tachypnea nor retractions. Breath sounds are clear and equal bilaterally. No wheezes/rales/rhonchi. Gastrointestinal: Soft and nontender. No distention. No abdominal bruits. Musculoskeletal: Nontender with normal range of motion in all extremities. No joint effusions.  No lower extremity tenderness nor edema. Neurologic:  Normal speech and language. No gross focal neurologic deficits are appreciated. Speech is normal. No gait instability. Skin:  Skin is warm, dry and intact. No rash noted. Psychiatric: Mood and affect are normal. Speech  and behavior are normal. Patient exhibits appropriate insight and judgment. ____________________________________________  EKG: Interpreted by me. Normal sinus rhythm with normal axis normal intervals. No evidence of hypertrophy or acute infarction. Rate is 65 bpm  ____________________________________________  ED COURSE:  Pertinent labs & imaging results that were available during my care of the patient were reviewed by me and considered in my medical  decision making (see chart for details). We'll recheck lab work after saline bolus here. ____________________________________________    [name] (pertinent positives/negatives)  Labs Reviewed  BASIC METABOLIC PANEL - Abnormal; Notable for the following:    Potassium 5.5 (*)    Glucose, Bld 62 (*)    BUN 29 (*)    All other components within normal limits  CBC - Abnormal; Notable for the following:    RBC 4.22 (*)    HCT 39.2 (*)    RDW 17.1 (*)    All other components within normal limits  BASIC METABOLIC PANEL - Abnormal; Notable for the following:    Potassium 5.4 (*)    BUN 27 (*)    Creatinine, Ser 1.26 (*)    GFR calc non Af Amer 59 (*)    All other components within normal limits    ____________________________________________  FINAL ASSESSMENT AND PLAN  Hyperkalemia  Plan: Patient with labs and imaging as dictated above. Patient was given saline as well as a meal tray, repeat labs as shown above. He is stable for outpatient follow-up with his doctor. Potassium is certainly not increasing, and he is stable.   [name], MD   [name], MD 11/10/14 6468  [name], MD 11/10/14 [phone]

## 2014-11-10 NOTE — ED Notes (Signed)
Patient to ED with report of sent by Dr. Laurelyn Sickle office due to elevated Potassium. Patient is unsure of what the actual level was. Blood was drawn last Thursday.

## 2014-11-10 NOTE — Discharge Instructions (Signed)
Hyperkalemia Hyperkalemia is when you have too much potassium in your blood. This can be a life-threatening condition. Potassium is normally removed (excreted) from the body by the kidneys. CAUSES  The potassium level in your body can become too high for the following reasons:  You take in too much potassium. You can do this by:  Using salt substitutes. They contain large amounts of potassium.  Taking potassium supplements from your caregiver. The dose may be too high for you.  Eating foods or taking nutritional products with potassium.  You excrete too little potassium. This can happen if:  Your kidneys are not functioning properly. Kidney (renal) disease is a very common cause of hyperkalemia.  You are taking medicines that lower your excretion of potassium, such as certain diuretic medicines.  You have an adrenal gland disease called Addison's disease.  You have a urinary tract obstruction, such as kidney stones.  You are on treatment to mechanically clean your blood (dialysis) and you skip a treatment.  You release a high amount of potassium from your cells into your blood. You may have a condition that causes potassium to move from your cells to your bloodstream. This can happen with:  Injury to muscles or other tissues. Most potassium is stored in the muscles.  Severe burns or infections.  Acidic blood plasma (acidosis). Acidosis can result from many diseases, such as uncontrolled diabetes. SYMPTOMS  Usually, there are no symptoms unless the potassium is dangerously high or has risen very quickly. Symptoms may include:  Irregular or very slow heartbeat.  Feeling sick to your stomach (nauseous).  Tiredness (fatigue).  Nerve problems such as tingling of the skin, numbness of the hands or feet, weakness, or paralysis. DIAGNOSIS  A simple blood test can measure the amount of potassium in your body. An electrocardiogram test of the heart can also help make the diagnosis.  The heart may beat dangerously fast or slow down and stop beating with severe hyperkalemia.  TREATMENT  Treatment depends on how bad the condition is and on the underlying cause.  If the hyperkalemia is an emergency (causing heart problems or paralysis), many different medicines can be used alone or together to lower the potassium level briefly. This may include an insulin injection even if you are not diabetic. Emergency dialysis may be needed to remove potassium from the body.  If the hyperkalemia is less severe or dangerous, the underlying cause is treated. This can include taking medicines if needed. Your prescription medicines may be changed. You may also need to take a medicine to help your body get rid of potassium. You may need to eat a diet low in potassium. HOME CARE INSTRUCTIONS   Take medicines and supplements as directed by your caregiver.  Do not take any over-the-counter medicines, supplements, natural products, herbs, or vitamins without reviewing them with your caregiver. Certain supplements and natural food products can have high amounts of potassium. Other products (such as ibuprofen) can damage weak kidneys and raise your potassium.  You may be asked to do repeat lab tests. Be sure to follow these directions.  If you have kidney disease, you may need to follow a low potassium diet. SEEK MEDICAL CARE IF:   You notice an irregular or very slow heartbeat.  You feel lightheaded.  You develop weakness that is unusual for you. SEEK IMMEDIATE MEDICAL CARE IF:   You have shortness of breath.  You have chest discomfort.  You pass out (faint). MAKE SURE YOU:   Understand   these instructions.  Will watch your condition.  Will get help right away if you are not doing well or get worse. Document Released: 03/24/2002 Document Revised: 06/26/2011 Document Reviewed: 07/09/2013 Exit[employer] Patient Information 2015 [employer]. This information is not intended to replace  advice given to you by your health care provider. Make sure you discuss any questions you have with your health care provider.  

## 2014-11-14 ENCOUNTER — Emergency Department: Payer: Medicare PPO

## 2014-11-14 ENCOUNTER — Observation Stay
Admission: EM | Admit: 2014-11-14 | Discharge: 2014-11-15 | Disposition: A | Discharge status: Home / Self Care | Payer: Medicare PPO | Attending: Internal Medicine | Admitting: Internal Medicine

## 2014-11-14 DIAGNOSIS — Z794 Long term (current) use of insulin: Secondary | ICD-10-CM | POA: Insufficient documentation

## 2014-11-14 DIAGNOSIS — I252 Old myocardial infarction: Secondary | ICD-10-CM | POA: Insufficient documentation

## 2014-11-14 DIAGNOSIS — N179 Acute kidney failure, unspecified: Secondary | ICD-10-CM | POA: Diagnosis not present

## 2014-11-14 DIAGNOSIS — E875 Hyperkalemia: Secondary | ICD-10-CM | POA: Diagnosis not present

## 2014-11-14 DIAGNOSIS — I509 Heart failure, unspecified: Secondary | ICD-10-CM | POA: Insufficient documentation

## 2014-11-14 DIAGNOSIS — N183 Chronic kidney disease, stage 3 (moderate): Secondary | ICD-10-CM | POA: Diagnosis not present

## 2014-11-14 DIAGNOSIS — Z6836 Body mass index (BMI) 36.0-36.9, adult: Secondary | ICD-10-CM | POA: Diagnosis not present

## 2014-11-14 DIAGNOSIS — E782 Mixed hyperlipidemia: Secondary | ICD-10-CM | POA: Insufficient documentation

## 2014-11-14 DIAGNOSIS — R079 Chest pain, unspecified: Principal | ICD-10-CM | POA: Insufficient documentation

## 2014-11-14 DIAGNOSIS — E1122 Type 2 diabetes mellitus with diabetic chronic kidney disease: Secondary | ICD-10-CM | POA: Insufficient documentation

## 2014-11-14 DIAGNOSIS — I129 Hypertensive chronic kidney disease with stage 1 through stage 4 chronic kidney disease, or unspecified chronic kidney disease: Secondary | ICD-10-CM | POA: Insufficient documentation

## 2014-11-14 DIAGNOSIS — M109 Gout, unspecified: Secondary | ICD-10-CM | POA: Diagnosis not present

## 2014-11-14 DIAGNOSIS — Z955 Presence of coronary angioplasty implant and graft: Secondary | ICD-10-CM | POA: Insufficient documentation

## 2014-11-14 DIAGNOSIS — I25119 Atherosclerotic heart disease of native coronary artery with unspecified angina pectoris: Secondary | ICD-10-CM | POA: Insufficient documentation

## 2014-11-14 DIAGNOSIS — Z7982 Long term (current) use of aspirin: Secondary | ICD-10-CM | POA: Insufficient documentation

## 2014-11-14 DIAGNOSIS — Z791 Long term (current) use of non-steroidal anti-inflammatories (NSAID): Secondary | ICD-10-CM | POA: Diagnosis not present

## 2014-11-14 DIAGNOSIS — Z79899 Other long term (current) drug therapy: Secondary | ICD-10-CM | POA: Insufficient documentation

## 2014-11-14 DIAGNOSIS — E669 Obesity, unspecified: Secondary | ICD-10-CM | POA: Diagnosis not present

## 2014-11-14 DIAGNOSIS — Z9889 Other specified postprocedural states: Secondary | ICD-10-CM | POA: Diagnosis not present

## 2014-11-14 DIAGNOSIS — I209 Angina pectoris, unspecified: Secondary | ICD-10-CM

## 2014-11-14 DIAGNOSIS — Z7901 Long term (current) use of anticoagulants: Secondary | ICD-10-CM | POA: Diagnosis not present

## 2014-11-14 DIAGNOSIS — Z9641 Presence of insulin pump (external) (internal): Secondary | ICD-10-CM | POA: Insufficient documentation

## 2014-11-14 DIAGNOSIS — Z9689 Presence of other specified functional implants: Secondary | ICD-10-CM | POA: Diagnosis not present

## 2014-11-14 HISTORY — DX: Gout, unspecified: M10.9

## 2014-11-14 HISTORY — DX: Essential (primary) hypertension: I10

## 2014-11-14 HISTORY — DX: Atherosclerotic heart disease of native coronary artery without angina pectoris: I25.10

## 2014-11-14 HISTORY — DX: Long term (current) use of insulin: Z79.4

## 2014-11-14 HISTORY — DX: Unspecified mood (affective) disorder: F39

## 2014-11-14 HISTORY — DX: Type 2 diabetes mellitus without complications: E11.9

## 2014-11-14 HISTORY — DX: Heart failure, unspecified: I50.9

## 2014-11-14 LAB — TROPONIN I
TROPONIN I: 0.03 ng/mL (ref <0.031)
Troponin I: 0.06 ng/mL — ABNORMAL HIGH (ref <0.031)
Troponin I: 0.07 ng/mL — ABNORMAL HIGH (ref <0.031)
Troponin I: 0.07 ng/mL — ABNORMAL HIGH (ref <0.031)

## 2014-11-14 LAB — CBC WITH DIFFERENTIAL/PLATELET
Basophils Absolute: 0 10*3/uL (ref 0–0.1)
Basophils Relative: 0 %
EOS ABS: 0.3 10*3/uL (ref 0–0.7)
EOS PCT: 4 %
HEMATOCRIT: 38.1 % — AB (ref 40.0–52.0)
HEMOGLOBIN: 12.5 g/dL — AB (ref 13.0–18.0)
Lymphocytes Relative: 40 %
Lymphs Abs: 3 10*3/uL (ref 1.0–3.6)
MCH: 30.3 pg (ref 26.0–34.0)
MCHC: 32.7 g/dL (ref 32.0–36.0)
MCV: 92.6 fL (ref 80.0–100.0)
MONO ABS: 0.9 10*3/uL (ref 0.2–1.0)
MONOS PCT: 12 %
Neutro Abs: 3.3 10*3/uL (ref 1.4–6.5)
Neutrophils Relative %: 44 %
Platelets: 197 10*3/uL (ref 150–440)
RBC: 4.11 MIL/uL — ABNORMAL LOW (ref 4.40–5.90)
RDW: 17.1 % — ABNORMAL HIGH (ref 11.5–14.5)
WBC: 7.6 10*3/uL (ref 3.8–10.6)

## 2014-11-14 LAB — COMPREHENSIVE METABOLIC PANEL
ALT: 17 U/L (ref 17–63)
ANION GAP: 12 (ref 5–15)
AST: 39 U/L (ref 15–41)
Albumin: 3.6 g/dL (ref 3.5–5.0)
Alkaline Phosphatase: 62 U/L (ref 38–126)
BUN: 41 mg/dL — ABNORMAL HIGH (ref 6–20)
CALCIUM: 8.9 mg/dL (ref 8.9–10.3)
CO2: 21 mmol/L — ABNORMAL LOW (ref 22–32)
CREATININE: 1.95 mg/dL — AB (ref 0.61–1.24)
Chloride: 103 mmol/L (ref 101–111)
GFR calc Af Amer: 41 mL/min — ABNORMAL LOW (ref >60)
GFR, EST NON AFRICAN AMERICAN: 35 mL/min — AB (ref >60)
GLUCOSE: 103 mg/dL — AB (ref 65–99)
Potassium: 4.6 mmol/L (ref 3.5–5.1)
Sodium: 136 mmol/L (ref 135–145)
Total Bilirubin: 0.5 mg/dL (ref 0.3–1.2)
Total Protein: 7 g/dL (ref 6.5–8.1)

## 2014-11-14 LAB — BASIC METABOLIC PANEL
ANION GAP: 8 (ref 5–15)
BUN: 41 mg/dL — ABNORMAL HIGH (ref 6–20)
CO2: 27 mmol/L (ref 22–32)
Calcium: 9.2 mg/dL (ref 8.9–10.3)
Chloride: 105 mmol/L (ref 101–111)
Creatinine, Ser: 1.81 mg/dL — ABNORMAL HIGH (ref 0.61–1.24)
GFR calc Af Amer: 45 mL/min — ABNORMAL LOW (ref >60)
GFR, EST NON AFRICAN AMERICAN: 38 mL/min — AB (ref >60)
Glucose, Bld: 62 mg/dL — ABNORMAL LOW (ref 65–99)
Potassium: 5 mmol/L (ref 3.5–5.1)
SODIUM: 140 mmol/L (ref 135–145)

## 2014-11-14 LAB — URINALYSIS COMPLETE WITH MICROSCOPIC (ARMC ONLY)
Bacteria, UA: NONE SEEN
Bilirubin Urine: NEGATIVE
Glucose, UA: NEGATIVE mg/dL
HGB URINE DIPSTICK: NEGATIVE
KETONES UR: NEGATIVE mg/dL
Leukocytes, UA: NEGATIVE
Nitrite: NEGATIVE
PROTEIN: NEGATIVE mg/dL
SPECIFIC GRAVITY, URINE: 1.013 (ref 1.005–1.030)
Squamous Epithelial / LPF: NONE SEEN
pH: 5 (ref 5.0–8.0)

## 2014-11-14 LAB — HEMOGLOBIN A1C
HEMOGLOBIN A1C: 6.4 % — AB (ref 4.0–6.0)
Hgb A1c MFr Bld: 6.5 % — ABNORMAL HIGH (ref 4.0–6.0)

## 2014-11-14 LAB — GLUCOSE, CAPILLARY
Glucose-Capillary: 46 mg/dL — ABNORMAL LOW (ref 65–99)
Glucose-Capillary: 131 mg/dL — ABNORMAL HIGH (ref 65–99)
Glucose-Capillary: 67 mg/dL (ref 65–99)
Glucose-Capillary: 125 mg/dL — ABNORMAL HIGH (ref 65–99)
Glucose-Capillary: 104 mg/dL — ABNORMAL HIGH (ref 65–99)
Glucose-Capillary: 85 mg/dL (ref 65–99)

## 2014-11-14 LAB — TSH: TSH: 1.441 u[IU]/mL (ref 0.350–4.500)

## 2014-11-14 LAB — LIPASE, BLOOD: Lipase: 33 U/L (ref 22–51)

## 2014-11-14 MED ORDER — DIVALPROEX SODIUM 500 MG PO DR TAB
500.0000 mg | DELAYED_RELEASE_TABLET | Freq: Every morning | ORAL | Status: DC
  Administered 2014-11-14 – 2014-11-15 (×2): 500 mg via ORAL
  Filled 2014-11-14 (×2): qty 1

## 2014-11-14 MED ORDER — HEPARIN SODIUM (PORCINE) 5000 UNIT/ML IJ SOLN
5000.0000 [IU] | Freq: Three times a day (TID) | INTRAMUSCULAR | Status: DC
  Administered 2014-11-14 – 2014-11-15 (×4): 5000 [IU] via SUBCUTANEOUS
  Filled 2014-11-14 (×4): qty 1

## 2014-11-14 MED ORDER — INSULIN GLARGINE 100 UNIT/ML ~~LOC~~ SOLN: 25.0000 [IU] | Freq: Every day | SUBCUTANEOUS | Status: DC

## 2014-11-14 MED ORDER — ROSUVASTATIN CALCIUM 10 MG PO TABS
10.0000 mg | ORAL_TABLET | Freq: Every day | ORAL | Status: DC
  Administered 2014-11-14: 10 mg via ORAL
  Filled 2014-11-14 (×2): qty 1

## 2014-11-14 MED ORDER — ACETAMINOPHEN 650 MG RE SUPP: 650.0000 mg | Freq: Four times a day (QID) | RECTAL | Status: DC | PRN

## 2014-11-14 MED ORDER — AMLODIPINE BESYLATE 5 MG PO TABS
5.0000 mg | ORAL_TABLET | Freq: Every day | ORAL | Status: DC
  Filled 2014-11-14: qty 1

## 2014-11-14 MED ORDER — INSULIN GLARGINE 100 UNIT/ML ~~LOC~~ SOLN
20.0000 [IU] | Freq: Every day | SUBCUTANEOUS | Status: DC
  Filled 2014-11-14: qty 0.2

## 2014-11-14 MED ORDER — SODIUM CHLORIDE 0.9 % IJ SOLN: 3.0000 mL | Freq: Two times a day (BID) | INTRAMUSCULAR | Status: DC

## 2014-11-14 MED ORDER — SODIUM CHLORIDE 0.9 % IV SOLN
INTRAVENOUS | Status: DC
  Administered 2014-11-14 – 2014-11-15 (×3): via INTRAVENOUS

## 2014-11-14 MED ORDER — LEVOTHYROXINE SODIUM 50 MCG PO TABS
50.0000 ug | ORAL_TABLET | Freq: Every day | ORAL | Status: DC
  Administered 2014-11-14 – 2014-11-15 (×2): 50 ug via ORAL
  Filled 2014-11-14 (×2): qty 1

## 2014-11-14 MED ORDER — METOPROLOL SUCCINATE ER 50 MG PO TB24
50.0000 mg | ORAL_TABLET | Freq: Every day | ORAL | Status: DC
  Administered 2014-11-14: 50 mg via ORAL
  Filled 2014-11-14 (×2): qty 1

## 2014-11-14 MED ORDER — NITROGLYCERIN 0.4 MG SL SUBL: 0.4000 mg | SUBLINGUAL_TABLET | SUBLINGUAL | Status: DC | PRN

## 2014-11-14 MED ORDER — FUROSEMIDE 20 MG PO TABS
20.0000 mg | ORAL_TABLET | Freq: Every day | ORAL | Status: DC
  Administered 2014-11-14 – 2014-11-15 (×2): 20 mg via ORAL
  Filled 2014-11-14 (×2): qty 1

## 2014-11-14 MED ORDER — INSULIN ASPART 100 UNIT/ML ~~LOC~~ SOLN: 0.0000 [IU] | Freq: Every day | SUBCUTANEOUS | Status: DC

## 2014-11-14 MED ORDER — CLOPIDOGREL BISULFATE 75 MG PO TABS
75.0000 mg | ORAL_TABLET | Freq: Every day | ORAL | Status: DC
  Administered 2014-11-14 – 2014-11-15 (×2): 75 mg via ORAL
  Filled 2014-11-14 (×2): qty 1

## 2014-11-14 MED ORDER — MORPHINE SULFATE 2 MG/ML IJ SOLN: 2.0000 mg | INTRAMUSCULAR | Status: DC | PRN

## 2014-11-14 MED ORDER — PREGABALIN 50 MG PO CAPS
100.0000 mg | ORAL_CAPSULE | Freq: Three times a day (TID) | ORAL | Status: DC
  Administered 2014-11-14 – 2014-11-15 (×4): 100 mg via ORAL
  Filled 2014-11-14 (×4): qty 2

## 2014-11-14 MED ORDER — ACETAMINOPHEN 325 MG PO TABS: 650.0000 mg | ORAL_TABLET | Freq: Four times a day (QID) | ORAL | Status: DC | PRN

## 2014-11-14 MED ORDER — DULOXETINE HCL 60 MG PO CPEP
60.0000 mg | ORAL_CAPSULE | Freq: Every day | ORAL | Status: DC
  Administered 2014-11-14 – 2014-11-15 (×2): 60 mg via ORAL
  Filled 2014-11-14 (×2): qty 1

## 2014-11-14 MED ORDER — VITAMIN C 500 MG PO TABS
500.0000 mg | ORAL_TABLET | Freq: Every day | ORAL | Status: DC
  Administered 2014-11-14 – 2014-11-15 (×2): 500 mg via ORAL
  Filled 2014-11-14 (×3): qty 1

## 2014-11-14 MED ORDER — LOSARTAN POTASSIUM 50 MG PO TABS
100.0000 mg | ORAL_TABLET | Freq: Every day | ORAL | Status: DC
  Administered 2014-11-14: 100 mg via ORAL
  Filled 2014-11-14: qty 2

## 2014-11-14 MED ORDER — AMLODIPINE BESYLATE 5 MG PO TABS
2.5000 mg | ORAL_TABLET | Freq: Every day | ORAL | Status: DC
  Administered 2014-11-14: 2.5 mg via ORAL
  Filled 2014-11-14: qty 1

## 2014-11-14 MED ORDER — DOCUSATE SODIUM 100 MG PO CAPS
100.0000 mg | ORAL_CAPSULE | Freq: Two times a day (BID) | ORAL | Status: DC
  Administered 2014-11-14: 100 mg via ORAL
  Filled 2014-11-14 (×3): qty 1

## 2014-11-14 MED ORDER — ASPIRIN 81 MG PO CHEW
324.0000 mg | CHEWABLE_TABLET | Freq: Once | ORAL | Status: AC
  Administered 2014-11-14: 324 mg via ORAL
  Filled 2014-11-14: qty 4

## 2014-11-14 MED ORDER — INSULIN ASPART 100 UNIT/ML ~~LOC~~ SOLN: 0.0000 [IU] | Freq: Three times a day (TID) | SUBCUTANEOUS | Status: DC

## 2014-11-14 MED ORDER — ONDANSETRON HCL 4 MG PO TABS: 4.0000 mg | ORAL_TABLET | Freq: Four times a day (QID) | ORAL | Status: DC | PRN

## 2014-11-14 MED ORDER — ASPIRIN EC 81 MG PO TBEC
81.0000 mg | DELAYED_RELEASE_TABLET | Freq: Every day | ORAL | Status: DC
  Administered 2014-11-14 – 2014-11-15 (×2): 81 mg via ORAL
  Filled 2014-11-14 (×2): qty 1

## 2014-11-14 MED ORDER — COLCHICINE 0.6 MG PO TABS
0.6000 mg | ORAL_TABLET | Freq: Every day | ORAL | Status: DC
  Administered 2014-11-14: 0.6 mg via ORAL
  Filled 2014-11-14 (×3): qty 1

## 2014-11-14 MED ORDER — FUROSEMIDE 20 MG PO TABS: 20.0000 mg | ORAL_TABLET | ORAL | Status: DC | PRN

## 2014-11-14 MED ORDER — ONDANSETRON HCL 4 MG/2ML IJ SOLN: 4.0000 mg | Freq: Four times a day (QID) | INTRAMUSCULAR | Status: DC | PRN

## 2014-11-14 NOTE — Progress Notes (Signed)
Patient very upset because he has not been discharged.  Explained to him that his Troponin levels were elevated and that we were waiting on the third troponin.  Last reading at 0.07.  Explained anticipated discharge 1-2 days per MD notes. Restarted NS 125/ml.  After talking and listening to patient he did calm down and is willing to stay. Bed not working, Changed out for another one.

## 2014-11-14 NOTE — Progress Notes (Signed)
Patient fs 64 and asymptomatic, MD made aware and apple juice was given , will recheck , continue to monitor

## 2014-11-14 NOTE — ED Notes (Signed)
Pt presents to ED via [facility] d/t c/o chest pain that started about 2hrs PTA. Per EMS, pt took 2 doses of Nitro without relief. Pt denies any SHOB, no N/V; denies radiation of pain. Pt is A&O, in NAD, calm and cooperative. Dr Joni Fears at bedside.

## 2014-11-14 NOTE — Progress Notes (Signed)
Recheck fs 131, will continue to monitor

## 2014-11-14 NOTE — Progress Notes (Signed)
MD was informed of pt's trop. Level of 0.07 , no order at this time will continue to monitor

## 2014-11-14 NOTE — Progress Notes (Signed)
Pt reports he takes 2.5 of amlodipine, 5 mg ordered.  MD, Dr. Lavetta Nielsen notified, approved dosage change.  Will continue to monitor.

## 2014-11-14 NOTE — H&P (Signed)
Dennis Mullen is an 64 y.o. male.   Chief Complaint: Chest pain HPI: The patient presents emergency department complaining of chest pressure. He states this is different than his previous myocardial fractions due to the severity of pain. At the onset of pain while he was walking through his kitchen he describes substernal pressure like "someone sitting on his chest". The pain was 8 out of 10 in severity. It resolved after 2 sublingual nitroglycerin tablets. He reports that he's had some chest pain associated with shortness of breath twice recently. Due to his history of coronary artery disease and recurrent symptoms emergency apartment called for admission.  Past Medical History  Diagnosis Date  . Diabetes mellitus, type II, insulin dependent   . Hypertension   . CHF (congestive heart failure)     ?  Marland Kitchen CAD (coronary artery disease)   . Gout   . Mood disorder     Past Surgical History  Procedure Laterality Date  . Coronary angioplasty with stent placement      x5 stents  . Right mastoidectomy    . Penile prosthesis implant      History reviewed. No pertinent family history. Social History:  reports that he has never smoked. He does not have any smokeless tobacco history on file. He reports that he does not drink alcohol. His drug history is not on file.  Allergies: No Known Allergies  Medications Prior to Admission  Medication Sig Dispense Refill  . amLODipine (NORVASC) 5 MG tablet Take 2.5 mg by mouth at bedtime.     Marland Kitchen aspirin EC 81 MG tablet Take 81 mg by mouth daily.    . clopidogrel (PLAVIX) 75 MG tablet Take 75 mg by mouth daily.    . divalproex (DEPAKOTE) 500 MG DR tablet Take 500 mg by mouth. One tablet in am, 2 tablets at night    . DULoxetine (CYMBALTA) 60 MG capsule Take 60 mg by mouth daily.    Marland Kitchen esomeprazole (NEXIUM) 40 MG capsule Take 40 mg by mouth every morning.     . furosemide (LASIX) 20 MG tablet Take 20 mg by mouth as needed. For swelling    . gemfibrozil  (LOPID) 600 MG tablet Take 600 mg by mouth 2 (two) times daily before a meal.    . insulin aspart (NOVOLOG) 100 UNIT/ML injection Inject into the skin. Take up to 66 units dialy as directed via VGO kit which equals to 1.25 units per hour with  Boluses as needed (up to 12 units at meals)    . levothyroxine (SYNTHROID, LEVOTHROID) 50 MCG tablet Take 50 mcg by mouth daily before breakfast.    . Liraglutide (VICTOZA) 18 MG/3ML SOPN Inject 1.8 mg into the skin daily.    Marland Kitchen losartan (COZAAR) 100 MG tablet Take 50-100 mg by mouth daily.    . nitroGLYCERIN (NITROSTAT) 0.4 MG SL tablet Place 0.4 mg under the tongue every 5 (five) minutes as needed.    . metFORMIN (GLUCOPHAGE) 1000 MG tablet Take 1,000 mg by mouth 2 (two) times daily with a meal.    . pioglitazone (ACTOS) 15 MG tablet Take 15 mg by mouth daily.    . pregabalin (LYRICA) 100 MG capsule Take 100 mg by mouth 3 (three) times daily.    . Probiotic Product (PROBIOTIC & ACIDOPHILUS EX ST PO) Take by mouth daily. 100 milligrams with a meal    . rosuvastatin (CRESTOR) 10 MG tablet Take 10 mg by mouth daily. Pt takes 1/2 tablet (54m  total)      Results for orders placed or performed during the hospital encounter of 11/14/14 (from the past 48 hour(s))  Comprehensive metabolic panel     Status: Abnormal   Collection Time: 11/14/14  1:55 AM  Result Value Ref Range   Sodium 136 135 - 145 mmol/L   Potassium 4.6 3.5 - 5.1 mmol/L   Chloride 103 101 - 111 mmol/L   CO2 21 (L) 22 - 32 mmol/L   Glucose, Bld 103 (H) 65 - 99 mg/dL   BUN 41 (H) 6 - 20 mg/dL   Creatinine, Ser 1.95 (H) 0.61 - 1.24 mg/dL   Calcium 8.9 8.9 - 10.3 mg/dL   Total Protein 7.0 6.5 - 8.1 g/dL   Albumin 3.6 3.5 - 5.0 g/dL   AST 39 15 - 41 U/L   ALT 17 17 - 63 U/L   Alkaline Phosphatase 62 38 - 126 U/L   Total Bilirubin 0.5 0.3 - 1.2 mg/dL   GFR calc non Af Amer 35 (L) >60 mL/min   GFR calc Af Amer 41 (L) >60 mL/min    Comment: (NOTE) The eGFR has been calculated using the CKD  EPI equation. This calculation has not been validated in all clinical situations. eGFR's persistently <60 mL/min signify possible Chronic Kidney Disease.    Anion gap 12 5 - 15  Lipase, blood     Status: None   Collection Time: 11/14/14  1:55 AM  Result Value Ref Range   Lipase 33 22 - 51 U/L  Troponin I     Status: None   Collection Time: 11/14/14  1:55 AM  Result Value Ref Range   Troponin I 0.03 <0.031 ng/mL    Comment:        NO INDICATION OF MYOCARDIAL INJURY.   CBC with Differential     Status: Abnormal   Collection Time: 11/14/14  1:55 AM  Result Value Ref Range   WBC 7.6 3.8 - 10.6 K/uL   RBC 4.11 (L) 4.40 - 5.90 MIL/uL   Hemoglobin 12.5 (L) 13.0 - 18.0 g/dL   HCT 38.1 (L) 40.0 - 52.0 %   MCV 92.6 80.0 - 100.0 fL   MCH 30.3 26.0 - 34.0 pg   MCHC 32.7 32.0 - 36.0 g/dL   RDW 17.1 (H) 11.5 - 14.5 %   Platelets 197 150 - 440 K/uL   Neutrophils Relative % 44 %   Neutro Abs 3.3 1.4 - 6.5 K/uL   Lymphocytes Relative 40 %   Lymphs Abs 3.0 1.0 - 3.6 K/uL   Monocytes Relative 12 %   Monocytes Absolute 0.9 0.2 - 1.0 K/uL   Eosinophils Relative 4 %   Eosinophils Absolute 0.3 0 - 0.7 K/uL   Basophils Relative 0 %   Basophils Absolute 0.0 0 - 0.1 K/uL  TSH     Status: None   Collection Time: 11/14/14  5:31 AM  Result Value Ref Range   TSH 1.441 0.350 - 4.500 uIU/mL  Troponin I     Status: Abnormal   Collection Time: 11/14/14  5:31 AM  Result Value Ref Range   Troponin I 0.06 (H) <0.031 ng/mL    Comment: READ BACK AND VERIFIED WITH SIMON TOURE ON 11/14/14 AT 0622 BY TB.        PERSISTENTLY INCREASED TROPONIN VALUES IN THE RANGE OF 0.04-0.49 ng/mL CAN BE SEEN IN:       -UNSTABLE ANGINA       -CONGESTIVE HEART FAILURE       -  MYOCARDITIS       -CHEST TRAUMA       -ARRYHTHMIAS       -LATE PRESENTING MYOCARDIAL INFARCTION       -COPD   CLINICAL FOLLOW-UP RECOMMENDED.    Dg Chest Port 1 View  11/14/2014   CLINICAL DATA:  Chest pain  EXAM: PORTABLE CHEST - 1 VIEW   COMPARISON:  06/27/2014  FINDINGS: A single AP portable view of the chest demonstrates no focal airspace consolidation or alveolar edema. The lungs are grossly clear. There is no large effusion or pneumothorax. Cardiac and mediastinal contours appear unremarkable.  IMPRESSION: No active disease.   Electronically Signed   By: Andreas Newport M.D.   On: 11/14/2014 02:26    Review of Systems  Constitutional: Negative for fever and chills.  HENT: Negative for sore throat and tinnitus.   Eyes: Negative for blurred vision and redness.  Respiratory: Positive for shortness of breath. Negative for cough.   Cardiovascular: Positive for chest pain. Negative for palpitations, orthopnea and PND.  Gastrointestinal: Negative for nausea, vomiting, abdominal pain and diarrhea.  Genitourinary: Negative for dysuria, urgency and frequency.  Musculoskeletal: Negative for myalgias and joint pain.  Skin: Negative for rash.       No lesions  Neurological: Negative for speech change, focal weakness and weakness.  Endo/Heme/Allergies: Does not bruise/bleed easily.       No temperature intolerance  Psychiatric/Behavioral: Negative for depression and suicidal ideas.    Blood pressure 144/70, pulse 57, temperature 97.6 F (36.4 C), temperature source Oral, resp. rate 20, height _0  (1.88 m), weight 129.729 kg (286 lb), SpO2 100 %. Physical Exam  Nursing note and vitals reviewed. Constitutional: He is oriented to person, place, and time. He appears well-developed and well-nourished. No distress.  HENT:  Head: Normocephalic and atraumatic.  Eyes: Conjunctivae and EOM are normal. Pupils are equal, round, and reactive to light. No scleral icterus.  Neck: Normal range of motion. No JVD present. No tracheal deviation present. No thyromegaly present.  Cardiovascular: Normal rate, regular rhythm, normal heart sounds and intact distal pulses.  Exam reveals no gallop and no friction rub.   No murmur heard. Respiratory:  Effort normal and breath sounds normal. No respiratory distress.  GI: Soft. Bowel sounds are normal. He exhibits no distension. There is no tenderness.  Genitourinary:  Deferred  Musculoskeletal: Normal range of motion. He exhibits no edema.  Lymphadenopathy:    He has no cervical adenopathy.  Neurological: He is alert and oriented to person, place, and time. No cranial nerve deficit.  Skin: Skin is warm and dry. No rash noted. He is not diaphoretic. No erythema.  Psychiatric: He has a normal mood and affect. His behavior is normal. Judgment and thought content normal.     Assessment/Plan This is a 64 year old Caucasian male admitted for chest pain. 1. Chest pain: The patient's troponin is mildly abnormal but EKG shows no signs of ischemia. We'll continue to follow the patient's biomarkers and obtain a cardiology consult for possible catheterization as we know of coronary disease that was not amenable to intervention on previous occasions but may have progressed to be the etiology of current chest pain episode. 2. Coronary artery disease: Continue secondary prevention 3. Hypertension: Continue amlodipine 4. Diabetes mellitus type 2: Check hemoglobin A1c. The patient wears a VGO insulin pump (not refillable). Ordinarily continue the patient's sliding scale insulin via pump but as he may undergo catheterization in the morning he will not be able to deliver  bolus doses of insulin due to sedation and/or embolization. Thus I will discontinue his insulin pump and placed the patient on sliding scale with a basal dose of Lantus (although notably, the VGO pump delivers basal insulin via insulin aspart). 5. Obesity: BMI is 36.8; encourage healthy diet and exercise as tolerated 6. DVT prophylaxis: Heparin 7. GI prophylaxis: None The patient is a full code. Time spent on admission orders and patient care approximately 35 minutes  Harrie Foreman 11/14/2014, 7:08 AM

## 2014-11-14 NOTE — Progress Notes (Signed)
Initial Nutrition Assessment  INTERVENTION:   Meals and Snacks: Cater to patient preferences Education: pt very aware of diabetic nutrition therapy as well as heart healthy nutrition therapy and was currently doing PTA Coordination of Care: pt reports having low blood sugars at night PTA and has been eating a snack of peanut butter and graham crackers at bedtime. Recommend continuing while admitted as blood sugar 46 this am (albeit pt NPO).   NUTRITION DIAGNOSIS:   Inadequate oral intake related to acute illness as evidenced by  (diet order NPO on admission).  GOAL:   Patient will meet greater than or equal to 90% of their needs  MONITOR:    (Energy Intake, Glucose Profile, Anthropometrics, Electrolyte and renal Profile)  REASON FOR ASSESSMENT:   Malnutrition Screening Tool    ASSESSMENT:   Pt admitted with chest pain.  Past Medical History  Diagnosis Date  . Diabetes mellitus, type II, insulin dependent   . Hypertension   . CHF (congestive heart failure)     ?  Marland Kitchen CAD (coronary artery disease)   . Gout   . Mood disorder      Diet Order:  Diet heart healthy/carb modified Room service appropriate?: Yes; Fluid consistency:: Thin    Current Nutrition: Pt had just gotten a tray on visit and was eating [place], rice and green beans with yogurt. (Pt counted his carbohydrates on meal tray during conversation).  Food/Nutrition-Related History: Pt reports eating a little less PTA as he has been doing outside yardwork and has been in the heat. Pt reports eating a bedtime snack nightly.   Medications: NS at 116mL/hr, vitamin C, Lantus, Novolog, Lasix  Electrolyte/Renal Profile and Glucose Profile:   Recent Labs Lab 11/14/14 0155 11/14/14 0531  NA 136 140  K 4.6 5.0  CL 103 105  CO2 21* 27  BUN 41* 41*  CREATININE 1.95* 1.81*  CALCIUM 8.9 9.2  GLUCOSE 103* 62*   Protein Profile:  Recent Labs Lab 11/14/14 0155  ALBUMIN 3.6   Skin:  Reviewed, no  issues  Gastrointestinal Profile: Last BM: 11/13/2014    Weight Change: Pt reports weight fluctuates. RD notes per [facility] encounters weight of 285lbs 3 months ago.  Height:   Ht Readings from Last 1 Encounters:  11/14/14 6\' 2"  (1.88 m)    Weight:   Wt Readings from Last 1 Encounters:  11/14/14 286 lb (129.729 kg)   BMI:  Body mass index is 36.7 kg/(m^2).  EDUCATION NEEDS:   No education needs identified at this time  [place], [place], LDN Pager [phone]

## 2014-11-14 NOTE — Progress Notes (Signed)
Pt inquiring about evening depakote dose.  Pt reports he takes 1000 mg at bedtime.  PTA list consulted and dosage listed.  Nursing staff offered to call the doctor.  Pt refused and reports he is frustrated about all of his medications, and is feeling frustration about several things during his stay.  Will continue to monitor.

## 2014-11-14 NOTE — Progress Notes (Signed)
Patient admitted to unit from ED for chest pain, patient alert and oriented, denies pain at this time, VSS , trop 0.06, Dr Marcille Blanco was notified no new order received, will continue to monitor.

## 2014-11-14 NOTE — Progress Notes (Signed)
[facility] [place] NAME: Dennis Mullen    MR#:  [id number]  DATE OF BIRTH:  06-May-1950  SUBJECTIVE:  CHIEF COMPLAINT:   Chief Complaint  Patient presents with  . Chest Pain   No complaints. Pain resolved  REVIEW OF SYSTEMS:   Review of Systems  Constitutional: Negative for fever.  Respiratory: Negative for shortness of breath.   Cardiovascular: Negative for chest pain and palpitations.  Gastrointestinal: Negative for nausea, vomiting and abdominal pain.  Genitourinary: Negative for dysuria.    DRUG ALLERGIES:  No Known Allergies  VITALS:  Blood pressure 136/66, pulse 53, temperature 97.7 F (36.5 C), temperature source Oral, resp. rate 18, height 6\' 2"  (1.88 m), weight 129.729 kg (286 lb), SpO2 96 %.  PHYSICAL EXAMINATION:  GENERAL:  64 y.o.-year-old patient lying sitting up with no acute distress.  EYES: Pupils equal, round, reactive to light and accommodation. No scleral icterus. Extraocular muscles intact.  HEENT: Head atraumatic, normocephalic. Oropharynx and nasopharynx clear.  NECK:  Supple, no jugular venous distention. No thyroid enlargement, no tenderness.  LUNGS: Normal breath sounds bilaterally, no wheezing, rales,rhonchi or crepitation. No use of accessory muscles of respiration.  CARDIOVASCULAR: S1, S2 normal. No murmurs, rubs, or gallops.  ABDOMEN: Soft, nontender, nondistended. Bowel sounds present. No organomegaly or mass.  EXTREMITIES: +1 pedal edema, no cyanosis, or clubbing.  NEUROLOGIC: Cranial nerves II through XII are intact. Muscle strength 5/5 in all extremities. Sensation intact. Gait not checked.  PSYCHIATRIC: The patient is alert and oriented x 3.  SKIN: No obvious rash, lesion, or ulcer.   LABORATORY PANEL:   CBC  Recent Labs Lab 11/14/14 0155  WBC 7.6  HGB 12.5*  HCT 38.1*  PLT 197    ------------------------------------------------------------------------------------------------------------------  Chemistries   Recent Labs Lab 11/14/14 0155 11/14/14 0531  NA 136 140  K 4.6 5.0  CL 103 105  CO2 21* 27  GLUCOSE 103* 62*  BUN 41* 41*  CREATININE 1.95* 1.81*  CALCIUM 8.9 9.2  AST 39  --   ALT 17  --   ALKPHOS 62  --   BILITOT 0.5  --    ------------------------------------------------------------------------------------------------------------------  Cardiac Enzymes  Recent Labs Lab 11/14/14 1058  TROPONINI 0.07*   ------------------------------------------------------------------------------------------------------------------  RADIOLOGY:  Dg Chest Port 1 View  11/14/2014   CLINICAL DATA:  Chest pain  EXAM: PORTABLE CHEST - 1 VIEW  COMPARISON:  06/27/2014  FINDINGS: A single AP portable view of the chest demonstrates no focal airspace consolidation or alveolar edema. The lungs are grossly clear. There is no large effusion or pneumothorax. Cardiac and mediastinal contours appear unremarkable.  IMPRESSION: No active disease.   Electronically Signed   By: Andreas Newport M.D.   On: 11/14/2014 02:26    EKG:   Orders placed or performed during the hospital encounter of 11/14/14  . EKG 12-Lead  . EKG 12-Lead    ASSESSMENT AND PLAN:   1) chest pain - appreciate cardiology consulting - resolved, trop only mild elevation - follow up with cards as outpatient. - recheck lipids in am  2) acute renal failure - states that this has occurred over past 14 days and PCP following - feels it is due to new medication possibly vicotza, which he has now stopped - slight improvement with IVF, if not much better in am will ask for nephro consult  3) diabetes mellitus - pump at home, has been discontinued for now and started on lantus ssi -  check A1c  4) hypertension - controlled  5) edema - states that this is also new in past 2 weeks, possibly due to  renal failure - low Na diet, daily weights  All the records are reviewed and case discussed with Care Management/Social Workerr. Management plans discussed with the patient, family and they are in agreement.  CODE STATUS: full  TOTAL TIME TAKING CARE OF THIS PATIENT:  35  minutes.  Greater than 50% of time spent in care coordination and counseling. POSSIBLE D/C IN 1-2 DAYS, DEPENDING ON CLINICAL CONDITION.   Myrtis Ser M.D on 11/14/2014 at 12:37 PM  Between 7am to 6pm - Pager - [phone]  After 6pm go to [email or web address] - password [email or web address] [facility]  Office  [phone]  CC: Primary care physician; Lavera Guise, MD

## 2014-11-14 NOTE — ED Provider Notes (Signed)
[facility] Emergency Department Provider Note  ____________________________________________  Time seen: 1:50 AM on arrival by EMS  I have reviewed the triage vital signs and the nursing notes.   HISTORY  Chief Complaint Chest Pain    HPI Dennis Mullen is a 64 y.o. male who complains of central chest pressure that started around 12:30 AM. Started while the patient was walking in his house and seemed to get worse with walking better with rest. It was associated with shortness of breath and diaphoresis without any nausea vomiting. It's nonradiating. It feels like prior heart attacks. He has a history of multiple heart attacks and stents. His cardiologist is Dr. [name].     Past Medical History  Diagnosis Date  . Diabetes mellitus without complication   . Hypertension   . CHF (congestive heart failure)     There are no active problems to display for this patient.   Past Surgical History  Procedure Laterality Date  . Coronary angioplasty with stent placement    . Right mastoidectomy    . Penile prosthesis implant      Current Outpatient Rx  Name  Route  Sig  Dispense  Refill  . amLODipine (NORVASC) 5 MG tablet   Oral   Take 5 mg by mouth at bedtime.         Marland Kitchen aspirin EC 81 MG tablet   Oral   Take by mouth.         . canagliflozin (INVOKANA) 300 MG TABS tablet   Oral   Take 300 mg by mouth daily before breakfast.         . clopidogrel (PLAVIX) 75 MG tablet   Oral   Take by mouth.         . colchicine 0.6 MG tablet   Oral   Take by mouth.         . diclofenac (VOLTAREN) 75 MG EC tablet      One tablet twice a day as needed         . divalproex (DEPAKOTE) 500 MG DR tablet   Oral   Take 500 mg by mouth. One tablet in am, 2 tablets at night         . DULoxetine (CYMBALTA) 60 MG capsule   Oral   Take 60 mg by mouth daily.         Marland Kitchen esomeprazole (NEXIUM) 40 MG capsule   Oral   Take 40 mg by mouth daily at  12 noon.         . furosemide (LASIX) 20 MG tablet   Oral   Take 20 mg by mouth as needed. For swelling         . insulin aspart (NOVOLOG) 100 UNIT/ML injection   Subcutaneous   Inject into the skin. Take up to 66 units dialy as directed via VGO kit which equals to 1.25 units per hour with  Boluses as needed (up to 12 units at meals)         . levothyroxine (SYNTHROID, LEVOTHROID) 50 MCG tablet   Oral   Take by mouth.         . Liraglutide 18 MG/3ML SOPN   Subcutaneous   Inject into the skin daily.         Marland Kitchen losartan (COZAAR) 100 MG tablet               . metFORMIN (GLUCOPHAGE) 1000 MG tablet   Oral   Take 1,000 mg  by mouth 2 (two) times daily with a meal.         . metoprolol succinate (TOPROL-XL) 50 MG 24 hr tablet   Oral   Take by mouth.         . nitroGLYCERIN (NITROSTAT) 0.4 MG SL tablet   Sublingual   Place 0.4 mg under the tongue every 5 (five) minutes as needed.         . pioglitazone (ACTOS) 15 MG tablet   Oral   Take 15 mg by mouth daily.         . pregabalin (LYRICA) 100 MG capsule   Oral   Take 100 mg by mouth 3 (three) times daily.         . Probiotic Product (PROBIOTIC & ACIDOPHILUS EX ST PO)   Oral   Take by mouth daily. 100 milligrams with a meal         . rosuvastatin (CRESTOR) 10 MG tablet   Oral   Take 10 mg by mouth daily. Pt takes 1/2 tablet (62m total)         . vitamin C (ASCORBIC ACID) 500 MG tablet   Oral   Take 500 mg by mouth daily. With a meal           Allergies Review of patient's allergies indicates no known allergies.  History reviewed. No pertinent family history.  Social History History  Substance Use Topics  . Smoking status: Never Smoker   . Smokeless tobacco: Not on file  . Alcohol Use: No    Review of Systems  Constitutional: No fever or chills. No weight changes Eyes:No blurry vision or double vision.  ENT: No sore throat. Cardiovascular: Positive chest pain. Respiratory: No  dyspnea or cough. Gastrointestinal: Negative for abdominal pain, vomiting and diarrhea.  No BRBPR or melena. Genitourinary: Negative for dysuria, urinary retention, bloody urine, or difficulty urinating. Musculoskeletal: Negative for back pain. No joint swelling or pain. Skin: Negative for rash. Neurological: Negative for headaches, focal weakness or numbness. Psychiatric:No anxiety or depression.   Endocrine:No hot/cold intolerance, changes in energy, or sleep difficulty.  10-point ROS otherwise negative.  ____________________________________________   PHYSICAL EXAM:  VITAL SIGNS: ED Triage Vitals  Enc Vitals Group     BP 11/14/14 0154 135/71 mmHg     Pulse Rate 11/14/14 0154 80     Resp 11/14/14 0154 16     Temp --      Temp src --      SpO2 11/14/14 0154 94 %     Weight 11/14/14 0154 286 lb (129.729 kg)     Height 11/14/14 0154 6' 2"  (1.88 m)     Head Cir --      Peak Flow --      Pain Score 11/14/14 0157 2     Pain Loc --      Pain Edu? --      Excl. in [place] --      Constitutional: Alert and oriented. Appears uncomfortable, in no distress Eyes: No scleral icterus. No conjunctival pallor. PERRL. EOMI ENT   Head: Normocephalic and atraumatic.   Nose: No congestion/rhinnorhea. No septal hematoma   Mouth/Throat: MMM, no pharyngeal erythema. No peritonsillar mass. No uvula shift.   Neck: No stridor. No SubQ emphysema. No meningismus. Hematological/Lymphatic/Immunilogical: No cervical lymphadenopathy. Cardiovascular: RRR. Normal and symmetric distal pulses are present in all extremities. No murmurs, rubs, or gallops. Respiratory: Normal respiratory effort without tachypnea nor retractions. Breath sounds are clear and equal bilaterally.  No wheezes/rales/rhonchi. Chest wall nontender Gastrointestinal: Soft and nontender. No distention. There is no CVA tenderness.  No rebound, rigidity, or guarding. Genitourinary: deferred Musculoskeletal: Nontender with normal  range of motion in all extremities. No joint effusions.  No lower extremity tenderness.  No edema. Neurologic:   Normal speech and language.  CN 2-10 normal. Motor grossly intact. No pronator drift.  Normal gait. No gross focal neurologic deficits are appreciated.  Skin:  Skin is warm, dry and intact. No rash noted.  No petechiae, purpura, or bullae. Psychiatric: Mood and affect are normal. Speech and behavior are normal. Patient exhibits appropriate insight and judgment.  ____________________________________________    LABS (pertinent positives/negatives) (all labs ordered are listed, but only abnormal results are displayed) Labs Reviewed  COMPREHENSIVE METABOLIC PANEL - Abnormal; Notable for the following:    CO2 21 (*)    Glucose, Bld 103 (*)    BUN 41 (*)    Creatinine, Ser 1.95 (*)    GFR calc non Af Amer 35 (*)    GFR calc Af Amer 41 (*)    All other components within normal limits  CBC WITH DIFFERENTIAL/PLATELET - Abnormal; Notable for the following:    RBC 4.11 (*)    Hemoglobin 12.5 (*)    HCT 38.1 (*)    RDW 17.1 (*)    All other components within normal limits  LIPASE, BLOOD  TROPONIN I   ____________________________________________   EKG  Interpreted by me Normal sinus rhythm rate of 80, normal axis intervals, inferior Q waves, poor R-wave progression in anterior precordial leads, normal ST segments and T waves  ____________________________________________    RADIOLOGY  Chest x-ray unremarkable  ____________________________________________   PROCEDURES  ____________________________________________   INITIAL IMPRESSION / ASSESSMENT AND PLAN / ED COURSE  Pertinent labs & imaging results that were available during my care of the patient were reviewed by me and considered in my medical decision making (see chart for details).  Patient presents with chest pain concerning for cardiac etiology. We'll check labs and give full dose aspirin and plan for  admission. Pain seems to resolved with multiple nitroglycerin given by EMS.  ____________________________________________   FINAL CLINICAL IMPRESSION(S) / ED DIAGNOSES  Final diagnoses:  Ischemic chest pain      Carrie Mew, MD 11/14/14 [phone]

## 2014-11-14 NOTE — Consult Note (Signed)
[facility] Consultation Note  Patient ID: Dennis Mullen, MRN: [id number], DOB/AGE: 21-Mar-1951 64 y.o. Admit date: 11/14/2014   Date of Consult: 11/14/2014 Primary Physician: Lavera Guise, MD Primary Cardiologist: [name]  Chief Complaint:  Chief Complaint  Patient presents with  . Chest Pain   Reason for Consult: chest pain with known coronary artery disease  HPI: 64 y.o. male with known coronary artery disease hypertension mixed hyperlipidemia diabetes with complication with some chronic kidney disease stage III having some hyperkalemia and adjustments of medication management in recent days. The patient had some substernal chest discomfort radiating into the back causing weakness and fatigue and palpitations for which happened over a one 1 hour. And nitroglycerin did help to some extent although majority of this helps with time. The patient was not appear to have any congestive heart failure with an elevated troponin of 0.06 more consistent with demand ischemia rather than acute coronary syndrome. The patient has full relief of chest discomfort with an EKG showing normal sinus rhythm with nonspecific EKG changes and no other evidence of significant anginal symptoms today  Past Medical History  Diagnosis Date  . Diabetes mellitus, type II, insulin dependent   . Hypertension   . CHF (congestive heart failure)     ?  Marland Kitchen CAD (coronary artery disease)   . Gout   . Mood disorder       Surgical History:  Past Surgical History  Procedure Laterality Date  . Coronary angioplasty with stent placement      x5 stents  . Right mastoidectomy    . Penile prosthesis implant       Home Meds: Prior to Admission medications   Medication Sig Start Date End Date Taking? Authorizing Provider  amLODipine (NORVASC) 5 MG tablet Take 2.5 mg by mouth at bedtime.    Yes Historical Provider, MD  aspirin EC 81 MG tablet Take 81 mg by mouth daily.   Yes Historical Provider, MD   clopidogrel (PLAVIX) 75 MG tablet Take 75 mg by mouth daily.   Yes Historical Provider, MD  divalproex (DEPAKOTE) 500 MG DR tablet Take 500 mg by mouth. One tablet in am, 2 tablets at night   Yes Historical Provider, MD  DULoxetine (CYMBALTA) 60 MG capsule Take 60 mg by mouth daily.   Yes Historical Provider, MD  esomeprazole (NEXIUM) 40 MG capsule Take 40 mg by mouth every morning.    Yes Historical Provider, MD  furosemide (LASIX) 20 MG tablet Take 20 mg by mouth as needed. For swelling   Yes Historical Provider, MD  gemfibrozil (LOPID) 600 MG tablet Take 600 mg by mouth 2 (two) times daily before a meal.   Yes Historical Provider, MD  insulin aspart (NOVOLOG) 100 UNIT/ML injection Inject into the skin. Take up to 66 units dialy as directed via VGO kit which equals to 1.25 units per hour with  Boluses as needed (up to 12 units at meals)   Yes Historical Provider, MD  levothyroxine (SYNTHROID, LEVOTHROID) 50 MCG tablet Take 50 mcg by mouth daily before breakfast.   Yes Historical Provider, MD  Liraglutide (VICTOZA) 18 MG/3ML SOPN Inject 1.8 mg into the skin daily.   Yes Historical Provider, MD  losartan (COZAAR) 100 MG tablet Take 50-100 mg by mouth daily.   Yes Historical Provider, MD  nitroGLYCERIN (NITROSTAT) 0.4 MG SL tablet Place 0.4 mg under the tongue every 5 (five) minutes as needed.   Yes Historical Provider, MD  metFORMIN (GLUCOPHAGE) 1000 MG  tablet Take 1,000 mg by mouth 2 (two) times daily with a meal.    Historical Provider, MD  pioglitazone (ACTOS) 15 MG tablet Take 15 mg by mouth daily.    Historical Provider, MD  pregabalin (LYRICA) 100 MG capsule Take 100 mg by mouth 3 (three) times daily.    Historical Provider, MD  Probiotic Product (PROBIOTIC & ACIDOPHILUS EX ST PO) Take by mouth daily. 100 milligrams with a meal    Historical Provider, MD  rosuvastatin (CRESTOR) 10 MG tablet Take 10 mg by mouth daily. Pt takes 1/2 tablet (41m total)    Historical Provider, MD    Inpatient  Medications:  . amLODipine  5 mg Oral QHS  . aspirin EC  81 mg Oral Daily  . clopidogrel  75 mg Oral Daily  . colchicine  0.6 mg Oral Daily  . divalproex  500 mg Oral q morning - 10a  . docusate sodium  100 mg Oral BID  . DULoxetine  60 mg Oral Daily  . heparin  5,000 Units Subcutaneous 3 times per day  . insulin aspart  0-15 Units Subcutaneous TID WC  . insulin aspart  0-5 Units Subcutaneous QHS  . insulin aspart  0-5 Units Subcutaneous QHS  . [START ON 11/15/2014] insulin glargine  20 Units Subcutaneous QHS  . levothyroxine  50 mcg Oral QAC breakfast  . losartan  100 mg Oral Daily  . metoprolol succinate  50 mg Oral Daily  . pregabalin  100 mg Oral TID  . rosuvastatin  10 mg Oral Daily  . sodium chloride  3 mL Intravenous Q12H  . vitamin C  500 mg Oral Daily   . sodium chloride 125 mL/hr at 11/14/14 06606   Allergies: No Known Allergies  History   Social History  . Marital Status: Single    Spouse Name: N/A  . Number of Children: N/A  . Years of Education: N/A   Occupational History  . Not on file.   Social History Main Topics  . Smoking status: Never Smoker   . Smokeless tobacco: Not on file  . Alcohol Use: No  . Drug Use: Not on file  . Sexual Activity: Not on file   Other Topics Concern  . Not on file   Social History Narrative  . No narrative on file     History reviewed. No pertinent family history.   Review of Systems Positive for chest discomfort gastroesophageal reflux Negative for: General:  chills, fever, night sweats or weight changes.  Cardiovascular: PND orthopnea syncope dizziness  Dermatological skin lesions rashes Respiratory: Cough congestion Urologic: Frequent urination urination at night and hematuria Abdominal: negative for nausea, vomiting, diarrhea, bright red blood per rectum, melena, or hematemesis Neurologic: negative for visual changes, and/or hearing changes  All other systems reviewed and are otherwise negative except as  noted above.  Labs:  Recent Labs  11/14/14 0155 11/14/14 0531  TROPONINI 0.03 0.06*   Lab Results  Component Value Date   WBC 7.6 11/14/2014   HGB 12.5* 11/14/2014   HCT 38.1* 11/14/2014   MCV 92.6 11/14/2014   PLT 197 11/14/2014    Recent Labs Lab 11/14/14 0155 11/14/14 0531  NA 136 140  K 4.6 5.0  CL 103 105  CO2 21* 27  BUN 41* 41*  CREATININE 1.95* 1.81*  CALCIUM 8.9 9.2  PROT 7.0  --   BILITOT 0.5  --   ALKPHOS 62  --   ALT 17  --   AST  39  --   GLUCOSE 103* 62*   Lab Results  Component Value Date   CHOL 223* 01/29/2014   HDL 34* 01/29/2014   LDLCALC SEE COMMENT 01/29/2014   TRIG 474* 01/29/2014   No results found for: DDIMER  Radiology/Studies:  Dg Chest Port 1 View  11/14/2014   CLINICAL DATA:  Chest pain  EXAM: PORTABLE CHEST - 1 VIEW  COMPARISON:  06/27/2014  FINDINGS: A single AP portable view of the chest demonstrates no focal airspace consolidation or alveolar edema. The lungs are grossly clear. There is no large effusion or pneumothorax. Cardiac and mediastinal contours appear unremarkable.  IMPRESSION: No active disease.   Electronically Signed   By: Andreas Newport M.D.   On: 11/14/2014 02:26    EKG: Normal sinus rhythm with nonspecific ST and T-wave changes  Weights: Filed Weights   11/14/14 0154  Weight: 286 lb (129.729 kg)     Physical Exam: Blood pressure 148/65, pulse 63, temperature 97.4 F (36.3 C), temperature source Oral, resp. rate 18, height 6' 2"  (1.88 m), weight 286 lb (129.729 kg), SpO2 95 %. Body mass index is 36.7 kg/(m^2). General: Well developed, well nourished, in no acute distress. Head eyes ears nose throat: Normocephalic, atraumatic, sclera non-icteric, no xanthomas, nares are without discharge. No apparent thyromegaly and/or mass  Lungs: Normal respiratory effort.  no wheezes, no rales, no rhonchi.  Heart: RRR with normal S1 S2. no murmur gallop, no rub, PMI is normal size and placement, carotid upstroke normal  without bruit, jugular venous pressure is normal Abdomen: Soft, non-tender, non-distended with normoactive bowel sounds. No hepatomegaly. No rebound/guarding. No obvious abdominal masses. Abdominal aorta is normal size without bruit Extremities: No edema. no cyanosis, no clubbing, no ulcers  Peripheral : 2+ bilateral upper extremity pulses, 2+ bilateral femoral pulses, 2+ bilateral dorsal pedal pulse Neuro: Alert and oriented. No facial asymmetry. No focal deficit. Moves all extremities spontaneously. Musculoskeletal: Normal muscle tone without kyphosis Psych:  Responds to questions appropriately with a normal affect.    Assessment: 64 year old male with known coronary artery disease status post previous myocardial infarction previous stenting except hyperlipidemia essential hypertension with diabetes with complication and chronic kidney disease stage III with hyperkalemia having chest discomfort possibly consistent with angina but stable at this time without evidence of myocardial infarction  Plan: 1. Continue amlodipine for chest discomfort and hypertension control 2. Dual antiplatelets medication management with Plavix and aspirin for further risk reduction of cardiovascular event 3. Furosemide for hyperkalemia and some edema 4. Losartan and metoprolol for hypertension control and renal protection watching closely for worsening chronic kidney disease and hyperkalemia 5. Begin ambulation and follow for further significant symptoms and popliteal stool discharge to home if no further symptoms of angina with follow-up with Dr. [name] next week for further possible testing and at medication management in adjustments  Signed, Corey Skains M.D. [facility] 11/14/2014, 10:26 AM

## 2014-11-15 DIAGNOSIS — N179 Acute kidney failure, unspecified: Secondary | ICD-10-CM | POA: Diagnosis present

## 2014-11-15 LAB — BASIC METABOLIC PANEL
Anion gap: 6 (ref 5–15)
BUN: 28 mg/dL — AB (ref 6–20)
CO2: 28 mmol/L (ref 22–32)
Calcium: 8.7 mg/dL — ABNORMAL LOW (ref 8.9–10.3)
Chloride: 107 mmol/L (ref 101–111)
Creatinine, Ser: 1.17 mg/dL (ref 0.61–1.24)
Glucose, Bld: 90 mg/dL (ref 65–99)
POTASSIUM: 5.1 mmol/L (ref 3.5–5.1)
SODIUM: 141 mmol/L (ref 135–145)

## 2014-11-15 LAB — GLUCOSE, CAPILLARY
Glucose-Capillary: 113 mg/dL — ABNORMAL HIGH (ref 65–99)
Glucose-Capillary: 57 mg/dL — ABNORMAL LOW (ref 65–99)
Glucose-Capillary: 82 mg/dL (ref 65–99)

## 2014-11-15 LAB — CBC
HCT: 40.9 % (ref 40.0–52.0)
Hemoglobin: 13.1 g/dL (ref 13.0–18.0)
MCH: 29.8 pg (ref 26.0–34.0)
MCHC: 32.1 g/dL (ref 32.0–36.0)
MCV: 92.9 fL (ref 80.0–100.0)
PLATELETS: 200 10*3/uL (ref 150–440)
RBC: 4.4 MIL/uL (ref 4.40–5.90)
RDW: 17 % — AB (ref 11.5–14.5)
WBC: 5.4 10*3/uL (ref 3.8–10.6)

## 2014-11-15 NOTE — Discharge Instructions (Signed)
Follow up as discussed with your Primary Care provider, Endocrinologist and Cardiologist in the next two weeks.    DIET:  Cardiac diet and Diabetic diet  DISCHARGE CONDITION:  Fair  ACTIVITY:  Activity as tolerated  OXYGEN:  Home Oxygen: No.   Oxygen Delivery: room air  DISCHARGE LOCATION:  home   If you experience worsening of your admission symptoms, develop shortness of breath, life threatening emergency, suicidal or homicidal thoughts you must seek medical attention immediately by calling 911 or calling your MD immediately  if symptoms less severe.  You Must read complete instructions/literature along with all the possible adverse reactions/side effects for all the Medicines you take and that have been prescribed to you. Take any new Medicines after you have completely understood and accpet all the possible adverse reactions/side effects.   Please note  You were cared for by a hospitalist during your hospital stay. If you have any questions about your discharge medications or the care you received while you were in the hospital after you are discharged, you can call the unit and asked to speak with the hospitalist on call if the hospitalist that took care of you is not available. Once you are discharged, your primary care physician will handle any further medical issues. Please note that NO REFILLS for any discharge medications will be authorized once you are discharged, as it is imperative that you return to your primary care physician (or establish a relationship with a primary care physician if you do not have one) for your aftercare needs so that they can reassess your need for medications and monitor your lab values.

## 2014-11-15 NOTE — Progress Notes (Signed)
FSBS 57, pt asymptomatic at this time, given orange juice, rechecked FSBS now 113

## 2014-11-15 NOTE — Discharge Summary (Signed)
[facility] NAME: Dennis Mullen    MR#:  [id number]  DATE OF BIRTH:  Oct 23, 1950  DATE OF ADMISSION:  11/14/2014 ADMITTING PHYSICIAN: Harrie Foreman, MD  DATE OF DISCHARGE: 11/15/14  PRIMARY CARE PHYSICIAN: Lavera Guise, MD    ADMISSION DIAGNOSIS:  Ischemic chest pain [I20.9]  DISCHARGE DIAGNOSIS:  Active Problems:   Chest pain   Acute renal failure   SECONDARY DIAGNOSIS:   Past Medical History  Diagnosis Date  . Diabetes mellitus, type II, insulin dependent   . Hypertension   . CHF (congestive heart failure)     ?  Marland Kitchen CAD (coronary artery disease)   . Gout   . Mood disorder     HOSPITAL COURSE:    1) chest pain: Only mild elevation in troponin, no EKG changes, no events on telemetry. Seen by his primary cardiologist Dr. [name] and no further intervention recommended during hospitalization. He'll follow-up with outpatient stress testing.  2) acute renal failure on chronic kidney disease stage III: There have been multiple changes to this patient's medication regimen over the past 2 weeks. He feels that the acute renal failure and hyperkalemia have been due to medication changes but is unclear on which medications may be causing this. Renal function much improved after 24 hours of IV hydration. He continues on his prior home regimen. He will follow-up with his primary care provider this week for reassessment of renal function and potassium.  3) diabetes mellitus: Follows with endocrinology and is usually on an insulin pump. This was discontinued during the hospitalization and he was treated with Lantus and sliding scale. He can resume use of the insulin pump upon discharge. Hemoglobin A1c 6.4.  4) hypertension: Fairly well controlled on home regimen. No changes.  DISCHARGE CONDITIONS:   Fair  CONSULTS OBTAINED:  Treatment Team:  Corey Skains, MD  DRUG ALLERGIES:  No Known  Allergies  DISCHARGE MEDICATIONS:   Current Discharge Medication List    CONTINUE these medications which have NOT CHANGED   Details  allopurinol (ZYLOPRIM) 100 MG tablet Take 100 mg by mouth every morning.    amLODipine (NORVASC) 5 MG tablet Take 2.5 mg by mouth at bedtime.     aspirin EC 81 MG tablet Take 81 mg by mouth every morning.     clopidogrel (PLAVIX) 75 MG tablet Take 75 mg by mouth every morning.     colchicine 0.6 MG tablet Take 0.6 mg by mouth 2 (two) times daily as needed.    diclofenac (VOLTAREN) 75 MG EC tablet Take 75 mg by mouth 2 (two) times daily as needed.    divalproex (DEPAKOTE) 500 MG DR tablet Take 500 mg by mouth. One tablet in am, 2 tablets at night    DULoxetine (CYMBALTA) 60 MG capsule Take 60 mg by mouth every morning.     esomeprazole (NEXIUM) 40 MG capsule Take 40 mg by mouth every morning.     furosemide (LASIX) 20 MG tablet Take 20 mg by mouth every morning. For swelling    gemfibrozil (LOPID) 600 MG tablet Take 600 mg by mouth 2 (two) times daily before a meal.    insulin aspart (NOVOLOG) 100 UNIT/ML injection Inject into the skin. Take up to 66 units dialy as directed via VGO kit which equals to 1.25 units per hour with  Boluses as needed (up to 12 units at meals)    levothyroxine (SYNTHROID, LEVOTHROID) 50 MCG  tablet Take 50 mcg by mouth daily before breakfast.    Liraglutide (VICTOZA) 18 MG/3ML SOPN Inject 1.8 mg into the skin every morning.     losartan (COZAAR) 100 MG tablet Take 50 mg by mouth every morning.     metFORMIN (GLUCOPHAGE) 1000 MG tablet Take 1,000 mg by mouth 2 (two) times daily with a meal.    nitroGLYCERIN (NITROSTAT) 0.4 MG SL tablet Place 0.4 mg under the tongue every 5 (five) minutes as needed.    pioglitazone (ACTOS) 15 MG tablet Take 15 mg by mouth every morning.     pregabalin (LYRICA) 100 MG capsule Take 100 mg by mouth 3 (three) times daily.    Probiotic Product (PROBIOTIC & ACIDOPHILUS EX ST PO) Take by  mouth every morning. 100 milligrams with a meal    testosterone (ANDROGEL) 50 MG/5GM (1%) GEL Place 5 g onto the skin daily.    rosuvastatin (CRESTOR) 10 MG tablet Take 5 mg by mouth at bedtime. Pt takes 1/2 tablet (60m total)      STOP taking these medications     metoprolol succinate (TOPROL-XL) 50 MG 24 hr tablet      vitamin C (ASCORBIC ACID) 500 MG tablet          DISCHARGE INSTRUCTIONS:   Follow up as discussed with your Primary Care provider, Endocrinologist and Cardiologist in the next two weeks.    DIET:  Cardiac diet and Diabetic diet  DISCHARGE CONDITION:  Fair  ACTIVITY:  Activity as tolerated  OXYGEN:  Home Oxygen: No.   Oxygen Delivery: room air  DISCHARGE LOCATION:  home   If you experience worsening of your admission symptoms, develop shortness of breath, life threatening emergency, suicidal or homicidal thoughts you must seek medical attention immediately by calling 911 or calling your MD immediately  if symptoms less severe.  You Must read complete instructions/literature along with all the possible adverse reactions/side effects for all the Medicines you take and that have been prescribed to you. Take any new Medicines after you have completely understood and accpet all the possible adverse reactions/side effects.   Please note  You were cared for by a hospitalist during your hospital stay. If you have any questions about your discharge medications or the care you received while you were in the hospital after you are discharged, you can call the unit and asked to speak with the hospitalist on call if the hospitalist that took care of you is not available. Once you are discharged, your primary care physician will handle any further medical issues. Please note that NO REFILLS for any discharge medications will be authorized once you are discharged, as it is imperative that you return to your primary care physician (or establish a relationship with a  primary care physician if you do not have one) for your aftercare needs so that they can reassess your need for medications and monitor your lab values.  Today   CHIEF COMPLAINT:   Chief Complaint  Patient presents with  . Chest Pain    HISTORY OF PRESENT ILLNESS:  The patient presents emergency department complaining of chest pressure. He states this is different than his previous myocardial fractions due to the severity of pain. At the onset of pain while he was walking through his kitchen he describes substernal pressure like "someone sitting on his chest". The pain was 8 out of 10 in severity. It resolved after 2 sublingual nitroglycerin tablets. He reports that he's had some chest pain associated with  shortness of breath twice recently. Due to his history of coronary artery disease and recurrent symptoms emergency apartment called for admission.  VITAL SIGNS:  Blood pressure 129/60, pulse 60, temperature 97.8 F (36.6 C), temperature source Oral, resp. rate 21, height 6' 2"  (1.88 m), weight 126.826 kg (279 lb 9.6 oz), SpO2 100 %.  I/O:   Intake/Output Summary (Last 24 hours) at 11/15/14 1340 Last data filed at 11/15/14 1100  Gross per 24 hour  Intake   1120 ml  Output   4251 ml  Net  -3131 ml    PHYSICAL EXAMINATION:  GENERAL:  64 y.o.-year-old patient lying in the bed with no acute distress.  EYES: Pupils equal, round, reactive to light and accommodation. No scleral icterus. Extraocular muscles intact.  HEENT: Head atraumatic, normocephalic. Oropharynx and nasopharynx clear.  NECK:  Supple, no jugular venous distention. No thyroid enlargement, no tenderness.  LUNGS: Normal breath sounds bilaterally, no wheezing, rales,rhonchi or crepitation. No use of accessory muscles of respiration.  CARDIOVASCULAR: S1, S2 normal. No murmurs, rubs, or gallops.  ABDOMEN: Soft, non-tender, non-distended. Bowel sounds present. No organomegaly or mass.  EXTREMITIES: No pedal edema, cyanosis,  or clubbing.  NEUROLOGIC: Cranial nerves II through XII are intact. Muscle strength 5/5 in all extremities. Sensation intact. Gait not checked.  PSYCHIATRIC: The patient is alert and oriented x 3.  SKIN: No obvious rash, lesion, or ulcer.   DATA REVIEW:   CBC  Recent Labs Lab 11/15/14 0414  WBC 5.4  HGB 13.1  HCT 40.9  PLT 200    Chemistries   Recent Labs Lab 11/14/14 0155  11/15/14 0414  NA 136  < > 141  K 4.6  < > 5.1  CL 103  < > 107  CO2 21*  < > 28  GLUCOSE 103*  < > 90  BUN 41*  < > 28*  CREATININE 1.95*  < > 1.17  CALCIUM 8.9  < > 8.7*  AST 39  --   --   ALT 17  --   --   ALKPHOS 62  --   --   BILITOT 0.5  --   --   < > = values in this interval not displayed.  Cardiac Enzymes  Recent Labs Lab 11/14/14 1650  TROPONINI 0.07*    Microbiology Results  No results found for this or any previous visit.  RADIOLOGY:  Dg Chest Port 1 View  11/14/2014   CLINICAL DATA:  Chest pain  EXAM: PORTABLE CHEST - 1 VIEW  COMPARISON:  06/27/2014  FINDINGS: A single AP portable view of the chest demonstrates no focal airspace consolidation or alveolar edema. The lungs are grossly clear. There is no large effusion or pneumothorax. Cardiac and mediastinal contours appear unremarkable.  IMPRESSION: No active disease.   Electronically Signed   By: Andreas Newport M.D.   On: 11/14/2014 02:26    EKG:   Orders placed or performed during the hospital encounter of 11/14/14  . EKG 12-Lead  . EKG 12-Lead      Management plans discussed with the patient, family and they are in agreement.  CODE STATUS:     Code Status Orders        Start     Ordered   11/14/14 0507  Full code   Continuous     11/14/14 0506    Advance Directive Documentation        Most Recent Value   Type of Advance Directive  Healthcare Power of [employer]  Pre-existing out of facility DNR order (yellow form or pink MOST form)     "MOST" Form in Place?        TOTAL TIME TAKING CARE OF THIS  PATIENT: 40 minutes.  Greater than 50% of time spent in care coordination and counseling.  Myrtis Ser M.D on 11/15/2014 at 1:40 PM  Between 7am to 6pm - Pager - [phone]  After 6pm go to [email or web address] - password [email or web address] [facility]  Office  [phone]  CC: Primary care physician; Lavera Guise, MD

## 2014-11-15 NOTE — Progress Notes (Signed)
Pt discharged to home via wc.  Instructions  given to pt.  Questions answered.  No distress.  

## 2014-11-15 NOTE — Progress Notes (Signed)
Assessment completed.  IVF infusing well lt fa.  No distress on ra.  Cardiac monitor in place, pt denies chest pain at this time.  Lungs clear.  Pt with very flat affect this am.  Denies need at this time.  CB in reach, SR up x 2.

## 2015-01-07 ENCOUNTER — Emergency Department
Admission: EM | Admit: 2015-01-07 | Discharge: 2015-01-07 | Disposition: A | Discharge status: Home / Self Care | Payer: Medicare PPO | Attending: Emergency Medicine | Admitting: Emergency Medicine

## 2015-01-07 ENCOUNTER — Emergency Department: Payer: Medicare PPO

## 2015-01-07 ENCOUNTER — Encounter: Payer: Self-pay | Admitting: *Deleted

## 2015-01-07 DIAGNOSIS — I10 Essential (primary) hypertension: Secondary | ICD-10-CM | POA: Insufficient documentation

## 2015-01-07 DIAGNOSIS — E119 Type 2 diabetes mellitus without complications: Secondary | ICD-10-CM | POA: Insufficient documentation

## 2015-01-07 DIAGNOSIS — Y9289 Other specified places as the place of occurrence of the external cause: Secondary | ICD-10-CM | POA: Insufficient documentation

## 2015-01-07 DIAGNOSIS — Z79899 Other long term (current) drug therapy: Secondary | ICD-10-CM | POA: Diagnosis not present

## 2015-01-07 DIAGNOSIS — Y998 Other external cause status: Secondary | ICD-10-CM | POA: Diagnosis not present

## 2015-01-07 DIAGNOSIS — Z791 Long term (current) use of non-steroidal anti-inflammatories (NSAID): Secondary | ICD-10-CM | POA: Diagnosis not present

## 2015-01-07 DIAGNOSIS — Z7982 Long term (current) use of aspirin: Secondary | ICD-10-CM | POA: Diagnosis not present

## 2015-01-07 DIAGNOSIS — Y9389 Activity, other specified: Secondary | ICD-10-CM | POA: Diagnosis not present

## 2015-01-07 DIAGNOSIS — S99911A Unspecified injury of right ankle, initial encounter: Secondary | ICD-10-CM | POA: Diagnosis present

## 2015-01-07 DIAGNOSIS — S92911A Unspecified fracture of right toe(s), initial encounter for closed fracture: Secondary | ICD-10-CM

## 2015-01-07 DIAGNOSIS — Z794 Long term (current) use of insulin: Secondary | ICD-10-CM | POA: Diagnosis not present

## 2015-01-07 DIAGNOSIS — Z792 Long term (current) use of antibiotics: Secondary | ICD-10-CM | POA: Diagnosis not present

## 2015-01-07 DIAGNOSIS — X58XXXA Exposure to other specified factors, initial encounter: Secondary | ICD-10-CM | POA: Diagnosis not present

## 2015-01-07 DIAGNOSIS — S92511A Displaced fracture of proximal phalanx of right lesser toe(s), initial encounter for closed fracture: Secondary | ICD-10-CM | POA: Diagnosis not present

## 2015-01-07 MED ORDER — TRAMADOL HCL 50 MG PO TABS
ORAL_TABLET | ORAL | Status: DC
Start: 2015-01-07 — End: 2015-01-08
  Filled 2015-01-07: qty 1

## 2015-01-07 MED ORDER — TRAMADOL HCL 50 MG PO TABS: 50.0000 mg | ORAL_TABLET | Freq: Four times a day (QID) | ORAL | Status: DC | PRN

## 2015-01-07 MED ORDER — TRAMADOL HCL 50 MG PO TABS
50.0000 mg | ORAL_TABLET | Freq: Once | ORAL | Status: AC
  Administered 2015-01-07: 50 mg via ORAL

## 2015-01-07 NOTE — ED Notes (Signed)
PA at bedside.

## 2015-01-07 NOTE — Discharge Instructions (Signed)
Wear cast shoe until evaluation by Podiatry clinic.

## 2015-01-07 NOTE — ED Notes (Signed)
Right ankle pain x 2 weeks. No known injury. Increasingly worse.

## 2015-01-07 NOTE — ED Provider Notes (Signed)
[facility] Emergency Department Provider Note  ____________________________________________  Time seen: Approximately 11:06 PM  I have reviewed the triage vital signs and the nursing notes.   HISTORY  Chief Complaint Ankle Pain    HPI Dennis Mullen is a 64 y.o. male patient complaining of right foot and ankle pain for 2 weeks. Patient stated no provocative incident for this complaint. Patient stated no swelling or redness to this area. Patient stated the pain increases when he stands on  his toes. Patient state he has a history of gout. Patient also state here fracture the fifth metatarsal years ago. Patient state he has appointment with his PCP next week and attempted to call his podiatrist but was unable to reach him. Patient is rating his pain as 8/10. No palliative measures for her complaint. Past Medical History  Diagnosis Date  . Obesity   . Diabetes mellitus, type II   . Thyroid disease   . Anxiety   . Bipolar disorder   . History of borderline personality disorder   . Depression   . Neuropathy   . Diabetes mellitus, type II, insulin dependent   . Hypertension   . CHF (congestive heart failure)     ?  Marland Kitchen CAD (coronary artery disease)   . Gout   . Mood disorder     Patient Active Problem List   Diagnosis Date Noted  . Acute renal failure 11/15/2014  . Chest pain 11/14/2014  . Bipolar 2 disorder 10/27/2014    Past Surgical History  Procedure Laterality Date  . Tonsillectomy and adenoidectomy    . Nasal septum surgery    . Mastoidectomy Right   . Penial inplant    . Coronary angioplasty with stent placement      x5 stents  . Right mastoidectomy    . Penile prosthesis implant      Current Outpatient Rx  Name  Route  Sig  Dispense  Refill  . ACCU-CHEK SMARTVIEW test strip                 Dispense as written.   Marland Kitchen allopurinol (ZYLOPRIM) 100 MG tablet               . allopurinol (ZYLOPRIM) 100 MG tablet   Oral   Take  100 mg by mouth every morning.         Marland Kitchen amLODipine (NORVASC) 5 MG tablet   Oral   Take 2.5 mg by mouth at bedtime.          Marland Kitchen amLODipine (NORVASC) 5 MG tablet               . aspirin 81 MG tablet   Oral   Take 81 mg by mouth daily.         Marland Kitchen aspirin EC 81 MG tablet   Oral   Take 81 mg by mouth every morning.          . cephALEXin (KEFLEX) 250 MG capsule               . clopidogrel (PLAVIX) 75 MG tablet               . clopidogrel (PLAVIX) 75 MG tablet   Oral   Take 75 mg by mouth every morning.          . colchicine 0.6 MG tablet   Oral   Take 0.6 mg by mouth daily.         . colchicine  0.6 MG tablet   Oral   Take 0.6 mg by mouth 2 (two) times daily as needed.         . CRESTOR 10 MG tablet                 Dispense as written.   . diclofenac (VOLTAREN) 75 MG EC tablet   Oral   Take 75 mg by mouth 2 (two) times daily.         . diclofenac (VOLTAREN) 75 MG EC tablet   Oral   Take 75 mg by mouth 2 (two) times daily as needed.         . divalproex (DEPAKOTE ER) 500 MG 24 hr tablet   Oral   Take 1 tablet (500 mg total) by mouth 3 (three) times daily between meals.   90 tablet   5   . divalproex (DEPAKOTE) 500 MG DR tablet   Oral   Take 500 mg by mouth. One tablet in am, 2 tablets at night         . DULoxetine (CYMBALTA) 60 MG capsule   Oral   Take 60 mg by mouth every morning.          . DULoxetine (CYMBALTA) 60 MG capsule   Oral   Take 1 capsule (60 mg total) by mouth daily.   30 capsule   5   . esomeprazole (NEXIUM) 40 MG capsule   Oral   Take 40 mg by mouth every morning.          Marland Kitchen esomeprazole (NEXIUM) 40 MG capsule               . furosemide (LASIX) 20 MG tablet   Oral   Take 20 mg by mouth every morning. For swelling         . gemfibrozil (LOPID) 600 MG tablet               . gemfibrozil (LOPID) 600 MG tablet   Oral   Take 600 mg by mouth 2 (two) times daily before a meal.         .  insulin aspart (NOVOLOG) 100 UNIT/ML injection   Subcutaneous   Inject into the skin. Take up to 66 units dialy as directed via VGO kit which equals to 1.25 units per hour with  Boluses as needed (up to 12 units at meals)         . Insulin Disposable Pump (V-GO 30) KIT               . INVOKANA 300 MG TABS tablet                 Dispense as written.   Marland Kitchen levofloxacin (LEVAQUIN) 750 MG tablet               . levothyroxine (SYNTHROID, LEVOTHROID) 50 MCG tablet               . levothyroxine (SYNTHROID, LEVOTHROID) 50 MCG tablet   Oral   Take 50 mcg by mouth daily before breakfast.         . Liraglutide (VICTOZA) 18 MG/3ML SOPN   Subcutaneous   Inject 1.8 mg into the skin every morning.          Marland Kitchen losartan (COZAAR) 100 MG tablet               . losartan (COZAAR) 100 MG tablet   Oral   Take 50 mg by mouth  every morning.          Marland Kitchen LYRICA 100 MG capsule                 Dispense as written.   . metFORMIN (GLUCOPHAGE) 1000 MG tablet   Oral   Take 1,000 mg by mouth 2 (two) times daily with a meal.         . metFORMIN (GLUCOPHAGE) 1000 MG tablet               . metoprolol succinate (TOPROL-XL) 50 MG 24 hr tablet               . mupirocin ointment (BACTROBAN) 2 %               . nitroGLYCERIN (NITROSTAT) 0.4 MG SL tablet   Sublingual   Place 0.4 mg under the tongue every 5 (five) minutes as needed.         Marland Kitchen NITROSTAT 0.4 MG SL tablet                 Dispense as written.   Marland Kitchen NOVOLOG 100 UNIT/ML injection                 Dispense as written.   . pioglitazone (ACTOS) 15 MG tablet   Oral   Take 15 mg by mouth every morning.          . pioglitazone (ACTOS) 15 MG tablet               . pregabalin (LYRICA) 100 MG capsule   Oral   Take 100 mg by mouth 3 (three) times daily.         . Probiotic Product (PROBIOTIC & ACIDOPHILUS EX ST PO)   Oral   Take by mouth every morning. 100 milligrams with a meal          . rosuvastatin (CRESTOR) 10 MG tablet   Oral   Take 5 mg by mouth at bedtime. Pt takes 1/2 tablet ($RemoveBef'5mg'RdcclaOFoE$  total)         . Testosterone (ANDROGEL PUMP TD)   Transdermal   Place onto the skin.         Marland Kitchen testosterone (ANDROGEL) 50 MG/5GM (1%) GEL   Transdermal   Place 5 g onto the skin daily.         . traMADol (ULTRAM) 50 MG tablet   Oral   Take 1 tablet (50 mg total) by mouth every 6 (six) hours as needed.   20 tablet   0   . VICTOZA 18 MG/3ML SOPN                 Dispense as written.     Allergies Review of patient's allergies indicates no known allergies.  Family History  Problem Relation Age of Onset  . Lung cancer Mother   . Heart attack Father   . Post-traumatic stress disorder Father   . Anxiety disorder Father   . Depression Father   . Heart attack Sister   . Heart attack Brother     Social History Social History  Substance Use Topics  . Smoking status: Never Smoker   . Smokeless tobacco: None  . Alcohol Use: No    Review of Systems Constitutional: No fever/chills Eyes: No visual changes. ENT: No sore throat. Cardiovascular: Denies chest pain. Respiratory: Denies shortness of breath. Gastrointestinal: No abdominal pain.  No nausea, no vomiting.  No diarrhea.  No constipation. Genitourinary: Negative for dysuria.  Musculoskeletal: Right foot and ankle pain Skin: Negative for rash. Neurological: Negative for headaches, focal weakness or numbness. Psychiatric:Anxiety, depression, and bipolar. Endocrine:Diabetes and hypothyroidism and hypertension. Hematological/Lymphatic: Allergic/Immunilogical: 10-point ROS otherwise negative.  ____________________________________________   PHYSICAL EXAM:  VITAL SIGNS: ED Triage Vitals  Enc Vitals Group     BP 01/07/15 2150 149/80 mmHg     Pulse Rate 01/07/15 2150 73     Resp 01/07/15 2150 16     Temp 01/07/15 2150 98.3 F (36.8 C)     Temp Source 01/07/15 2150 Oral     SpO2 01/07/15 2150  100 %     Weight 01/07/15 2150 280 lb (127.007 kg)     Height 01/07/15 2150 6' 2" (1.88 m)     Head Cir --      Peak Flow --      Pain Score 01/07/15 2150 8     Pain Loc --      Pain Edu? --      Excl. in [place]? --     Constitutional: Alert and oriented. Well appearing and in no acute distress. Eyes: Conjunctivae are normal. PERRL. EOMI. Head: Atraumatic. Nose: No congestion/rhinnorhea. Mouth/Throat: Mucous membranes are moist.  Oropharynx non-erythematous. Neck: No stridor.   Hematological/Lymphatic/Immunilogical: No cervical lymphadenopathy. Cardiovascular: Normal rate, regular rhythm. Grossly normal heart sounds.  Good peripheral circulation. Respiratory: Normal respiratory effort.  No retractions. Lungs CTAB. Gastrointestinal: Soft and nontender. No distention. No abdominal bruits. No CVA tenderness. Musculoskeletal: No edema or erythema to the right foot or ankle. Patient has some moderate guarding palpation to the fifth metacarpal. Patient has atypical gait favoring the right foot. Neurologic:  Normal speech and language. No gross focal neurologic deficits are appreciated. No gait instability. Skin:  Skin is warm, dry and intact. No rash noted. Psychiatric: Mood and affect are normal. Speech and behavior are normal.  ____________________________________________   LABS (all labs ordered are listed, but only abnormal results are displayed)  Labs Reviewed - No data to display ____________________________________________  EKG   ____________________________________________  RADIOLOGY  Findings consistent with full proximal phalange fracture of the fifth digit right foot. I, Sable Feil, personally viewed and evaluated these images (plain radiographs) as part of my medical decision making.   ____________________________________________   PROCEDURES  Procedure(s) performed: None  Critical Care performed: No  ____________________________________________   INITIAL  IMPRESSION / ASSESSMENT AND PLAN / ED COURSE  Pertinent labs & imaging results that were available during my care of the patient were reviewed by me and considered in my medical decision making (see chart for details).  Fractured toe left foot. Discuss x-ray findings with patient. Patient placed in cast shoe. Patient given tramadol and advised follow-up with his podiatrist for continued care. ____________________________________________   FINAL CLINICAL IMPRESSION(S) / ED DIAGNOSES  Final diagnoses:  Fracture of right toe, closed, initial encounter      Sable Feil, PA-C 01/07/15 [place], MD 01/08/15 [id number]

## 2015-01-07 NOTE — ED Notes (Addendum)
Pt states rt ankle pain x 2 weeks without injury. No swelling noted. Has an appt with pcp next week. Attempted to call podiatrist but was unable to reach him

## 2015-02-19 ENCOUNTER — Emergency Department: Payer: Medicare PPO

## 2015-02-19 ENCOUNTER — Emergency Department
Admission: EM | Admit: 2015-02-19 | Discharge: 2015-02-19 | Disposition: A | Discharge status: Home / Self Care | Payer: Medicare PPO | Attending: Emergency Medicine | Admitting: Emergency Medicine

## 2015-02-19 ENCOUNTER — Encounter: Payer: Self-pay | Admitting: Emergency Medicine

## 2015-02-19 DIAGNOSIS — E119 Type 2 diabetes mellitus without complications: Secondary | ICD-10-CM | POA: Insufficient documentation

## 2015-02-19 DIAGNOSIS — Z794 Long term (current) use of insulin: Secondary | ICD-10-CM | POA: Insufficient documentation

## 2015-02-19 DIAGNOSIS — Z7902 Long term (current) use of antithrombotics/antiplatelets: Secondary | ICD-10-CM | POA: Insufficient documentation

## 2015-02-19 DIAGNOSIS — Z7982 Long term (current) use of aspirin: Secondary | ICD-10-CM | POA: Insufficient documentation

## 2015-02-19 DIAGNOSIS — Z79899 Other long term (current) drug therapy: Secondary | ICD-10-CM | POA: Insufficient documentation

## 2015-02-19 DIAGNOSIS — I25119 Atherosclerotic heart disease of native coronary artery with unspecified angina pectoris: Secondary | ICD-10-CM | POA: Insufficient documentation

## 2015-02-19 DIAGNOSIS — Z7984 Long term (current) use of oral hypoglycemic drugs: Secondary | ICD-10-CM | POA: Diagnosis not present

## 2015-02-19 DIAGNOSIS — Z791 Long term (current) use of non-steroidal anti-inflammatories (NSAID): Secondary | ICD-10-CM | POA: Insufficient documentation

## 2015-02-19 DIAGNOSIS — R079 Chest pain, unspecified: Secondary | ICD-10-CM | POA: Diagnosis present

## 2015-02-19 DIAGNOSIS — I209 Angina pectoris, unspecified: Secondary | ICD-10-CM

## 2015-02-19 DIAGNOSIS — I10 Essential (primary) hypertension: Secondary | ICD-10-CM | POA: Diagnosis not present

## 2015-02-19 LAB — CBC
HEMATOCRIT: 34.3 % — AB (ref 40.0–52.0)
Hemoglobin: 11.7 g/dL — ABNORMAL LOW (ref 13.0–18.0)
MCH: 32 pg (ref 26.0–34.0)
MCHC: 34.1 g/dL (ref 32.0–36.0)
MCV: 94 fL (ref 80.0–100.0)
PLATELETS: 253 10*3/uL (ref 150–440)
RBC: 3.65 MIL/uL — ABNORMAL LOW (ref 4.40–5.90)
RDW: 15.9 % — AB (ref 11.5–14.5)
WBC: 9.9 10*3/uL (ref 3.8–10.6)

## 2015-02-19 LAB — BASIC METABOLIC PANEL
Anion gap: 8 (ref 5–15)
BUN: 31 mg/dL — AB (ref 6–20)
CALCIUM: 8.9 mg/dL (ref 8.9–10.3)
CO2: 22 mmol/L (ref 22–32)
CREATININE: 1.23 mg/dL (ref 0.61–1.24)
Chloride: 107 mmol/L (ref 101–111)
GFR calc Af Amer: >60 mL/min (ref >60)
GLUCOSE: 253 mg/dL — AB (ref 65–99)
POTASSIUM: 5.6 mmol/L — AB (ref 3.5–5.1)
SODIUM: 137 mmol/L (ref 135–145)

## 2015-02-19 LAB — HEPATIC FUNCTION PANEL
ALBUMIN: 3.6 g/dL (ref 3.5–5.0)
ALK PHOS: 83 U/L (ref 38–126)
ALT: 21 U/L (ref 17–63)
AST: 26 U/L (ref 15–41)
Bilirubin, Direct: <0.1 mg/dL — ABNORMAL LOW (ref 0.1–0.5)
TOTAL PROTEIN: UNDETERMINED g/dL (ref 6.5–8.1)
Total Bilirubin: 0.4 mg/dL (ref 0.3–1.2)

## 2015-02-19 LAB — LIPASE, BLOOD: LIPASE: 37 U/L (ref 11–51)

## 2015-02-19 LAB — TROPONIN I
TROPONIN I: 0.04 ng/mL — AB (ref <0.031)
TROPONIN I: 0.05 ng/mL — AB (ref <0.031)

## 2015-02-19 LAB — MAGNESIUM: MAGNESIUM: 2.1 mg/dL (ref 1.7–2.4)

## 2015-02-19 MED ORDER — ASPIRIN 81 MG PO CHEW
243.0000 mg | CHEWABLE_TABLET | Freq: Once | ORAL | Status: AC
  Administered 2015-02-19: 243 mg via ORAL
  Filled 2015-02-19: qty 3

## 2015-02-19 MED ORDER — ISOSORBIDE MONONITRATE ER 30 MG PO TB24: 30.0000 mg | ORAL_TABLET | Freq: Every day | ORAL | Status: DC

## 2015-02-19 NOTE — ED Notes (Signed)
Pt, who has strong history of CAD with MI and PCI x5, began having mild chest pressure this morning around 3am, and then, at 10:30 this morning, when walking to the trashcan, became short of breath and had 7/10 chest pressure.  On arrival to ED, patient had no pain.

## 2015-02-19 NOTE — Discharge Instructions (Signed)
You have been seen in the Emergency Department (ED) today for chest pain.  As we have discussed todays test results are reassuring, but we agree the pain is likely coming from your heart.  You need to take your regular medications (including your nitroglycerin) as prescribed.  As per Dr. Bethanne Ginger recommendations, we also prescribed you another medication which may help with the chest discomfort.  Please follow up with the recommended doctor as instructed above in these documents regarding todays emergent visit and your recent symptoms to discuss further management.  Continue to take your regular medications.   Return to the Emergency Department (ED) if you experience any further chest pain/pressure/tightness, difficulty breathing, or sudden sweating, or other symptoms that concern you.   Chest Pain (Nonspecific) It is often hard to give a specific diagnosis for the cause of chest pain. There is always a chance that your pain could be related to something serious, such as a heart attack or a blood clot in the lungs. You need to follow up with your health care provider for further evaluation. CAUSES   Heartburn.  Pneumonia or bronchitis.  Anxiety or stress.  Inflammation around your heart (pericarditis) or lung (pleuritis or pleurisy).  A blood clot in the lung.  A collapsed lung (pneumothorax). It can develop suddenly on its own (spontaneous pneumothorax) or from trauma to the chest.  Shingles infection (herpes zoster virus). The chest wall is composed of bones, muscles, and cartilage. Any of these can be the source of the pain.  The bones can be bruised by injury.  The muscles or cartilage can be strained by coughing or overwork.  The cartilage can be affected by inflammation and become sore (costochondritis). DIAGNOSIS  Lab tests or other studies may be needed to find the cause of your pain. Your health care provider may have you take a test called an ambulatory electrocardiogram  (ECG). An ECG records your heartbeat patterns over a 24-hour period. You may also have other tests, such as:  Transthoracic echocardiogram (TTE). During echocardiography, sound waves are used to evaluate how blood flows through your heart.  Transesophageal echocardiogram (TEE).  Cardiac monitoring. This allows your health care provider to monitor your heart rate and rhythm in real time.  Holter monitor. This is a portable device that records your heartbeat and can help diagnose heart arrhythmias. It allows your health care provider to track your heart activity for several days, if needed.  Stress tests by exercise or by giving medicine that makes the heart beat faster. TREATMENT   Treatment depends on what may be causing your chest pain. Treatment may include:  Acid blockers for heartburn.  Anti-inflammatory medicine.  Pain medicine for inflammatory conditions.  Antibiotics if an infection is present.  You may be advised to change lifestyle habits. This includes stopping smoking and avoiding alcohol, caffeine, and chocolate.  You may be advised to keep your head raised (elevated) when sleeping. This reduces the chance of acid going backward from your stomach into your esophagus. Most of the time, nonspecific chest pain will improve within 2-3 days with rest and mild pain medicine.  HOME CARE INSTRUCTIONS   If antibiotics were prescribed, take them as directed. Finish them even if you start to feel better.  For the next few days, avoid physical activities that bring on chest pain. Continue physical activities as directed.  Do not use any tobacco products, including cigarettes, chewing tobacco, or electronic cigarettes.  Avoid drinking alcohol.  Only take medicine as directed  by your health care provider.  Follow your health care provider's suggestions for further testing if your chest pain does not go away.  Keep any follow-up appointments you made. If you do not go to an  appointment, you could develop lasting (chronic) problems with pain. If there is any problem keeping an appointment, call to reschedule. SEEK MEDICAL CARE IF:   Your chest pain does not go away, even after treatment.  You have a rash with blisters on your chest.  You have a fever. SEEK IMMEDIATE MEDICAL CARE IF:   You have increased chest pain or pain that spreads to your arm, neck, jaw, back, or abdomen.  You have shortness of breath.  You have an increasing cough, or you cough up blood.  You have severe back or abdominal pain.  You feel nauseous or vomit.  You have severe weakness.  You faint.  You have chills. This is an emergency. Do not wait to see if the pain will go away. Get medical help at once. Call your local emergency services (911 in [place] Do not drive yourself to the hospital. MAKE SURE YOU:   Understand these instructions.  Will watch your condition.  Will get help right away if you are not doing well or get worse. Document Released: 01/11/2005 Document Revised: 04/08/2013 Document Reviewed: 11/07/2007 [facility] Patient Information 2015 [place], [place]. This information is not intended to replace advice given to you by your health care provider. Make sure you discuss any questions you have with your health care provider.

## 2015-02-19 NOTE — ED Provider Notes (Signed)
[facility] Emergency Department Provider Note  ____________________________________________  Time seen: Approximately 12:10 PM  I have reviewed the triage vital signs and the nursing notes.   HISTORY  Chief Complaint Chest Pain    HPI Dennis Mullen is a 64 y.o. male with an extensive history of coronary artery disease status post 5 stents, hypertension, diabetes, and obesity who presents with intermittent severe chest discomfort for approximately 9 hours.  He states that he awoke this morning around 3 AM to go to the bathroom and started feeling strong central chest pressure and burning that he rates as severe.  It came and went several times over the next few hours.  He felt better when he got up this morning and he went out to the parking lot to pick up trash which is one of his usual activities.  This caused the chest discomfort to come back and be more severe.  He decided that he should get it checked out given his strong cardiac history.  He was last admitted just over 3 months ago for ischemic chest pain and had an outpatient follow-up stress test with Dr. [name], but he is unsure of the results.  He states that he takes both aspirin and Plavix every day and that he had his medicines so far this morning.  He has had no infectious symptoms recently.  He does endorse shortness of breath along with the chest pain, but he denies nausea/vomiting, diaphoresis, abdominal pain.   Past Medical History  Diagnosis Date  . Obesity   . Diabetes mellitus, type II ([place])   . Thyroid disease   . Anxiety   . Bipolar disorder ([place])   . History of borderline personality disorder   . Depression   . Neuropathy ([place])   . Diabetes mellitus, type II, insulin dependent ([place])   . Hypertension   . CHF (congestive heart failure) ([place])     ?  Marland Kitchen CAD (coronary artery disease)   . Gout   . Mood disorder [facility])     Patient Active Problem List   Diagnosis Date Noted  .  Acute renal failure ([place]) 11/15/2014  . Chest pain 11/14/2014  . Bipolar 2 disorder ([place]) 10/27/2014    Past Surgical History  Procedure Laterality Date  . Tonsillectomy and adenoidectomy    . Nasal septum surgery    . Mastoidectomy Right   . Penial inplant    . Coronary angioplasty with stent placement      x5 stents  . Right mastoidectomy    . Penile prosthesis implant      Current Outpatient Rx  Name  Route  Sig  Dispense  Refill  . ACCU-CHEK SMARTVIEW test strip                 Dispense as written.   Marland Kitchen allopurinol (ZYLOPRIM) 100 MG tablet               . allopurinol (ZYLOPRIM) 100 MG tablet   Oral   Take 100 mg by mouth every morning.         Marland Kitchen amLODipine (NORVASC) 5 MG tablet   Oral   Take 2.5 mg by mouth at bedtime.          Marland Kitchen amLODipine (NORVASC) 5 MG tablet               . aspirin 81 MG tablet   Oral   Take 81 mg by mouth daily.         Marland Kitchen  aspirin EC 81 MG tablet   Oral   Take 81 mg by mouth every morning.          . cephALEXin (KEFLEX) 250 MG capsule               . clopidogrel (PLAVIX) 75 MG tablet               . clopidogrel (PLAVIX) 75 MG tablet   Oral   Take 75 mg by mouth every morning.          . colchicine 0.6 MG tablet   Oral   Take 0.6 mg by mouth daily.         . colchicine 0.6 MG tablet   Oral   Take 0.6 mg by mouth 2 (two) times daily as needed.         . CRESTOR 10 MG tablet                 Dispense as written.   . diclofenac (VOLTAREN) 75 MG EC tablet   Oral   Take 75 mg by mouth 2 (two) times daily.         . diclofenac (VOLTAREN) 75 MG EC tablet   Oral   Take 75 mg by mouth 2 (two) times daily as needed.         . divalproex (DEPAKOTE ER) 500 MG 24 hr tablet   Oral   Take 1 tablet (500 mg total) by mouth 3 (three) times daily between meals.   90 tablet   5   . divalproex (DEPAKOTE) 500 MG DR tablet   Oral   Take 500 mg by mouth. One tablet in am, 2 tablets at night          . DULoxetine (CYMBALTA) 60 MG capsule   Oral   Take 60 mg by mouth every morning.          . DULoxetine (CYMBALTA) 60 MG capsule   Oral   Take 1 capsule (60 mg total) by mouth daily.   30 capsule   5   . esomeprazole (NEXIUM) 40 MG capsule   Oral   Take 40 mg by mouth every morning.          Marland Kitchen esomeprazole (NEXIUM) 40 MG capsule               . furosemide (LASIX) 20 MG tablet   Oral   Take 20 mg by mouth every morning. For swelling         . gemfibrozil (LOPID) 600 MG tablet               . gemfibrozil (LOPID) 600 MG tablet   Oral   Take 600 mg by mouth 2 (two) times daily before a meal.         . insulin aspart (NOVOLOG) 100 UNIT/ML injection   Subcutaneous   Inject into the skin. Take up to 66 units dialy as directed via VGO kit which equals to 1.25 units per hour with  Boluses as needed (up to 12 units at meals)         . Insulin Disposable Pump (V-GO 30) KIT               . INVOKANA 300 MG TABS tablet                 Dispense as written.   . isosorbide mononitrate (IMDUR) 30 MG 24 hr tablet   Oral  Take 1 tablet (30 mg total) by mouth daily.   30 tablet   0   . levofloxacin (LEVAQUIN) 750 MG tablet               . levothyroxine (SYNTHROID, LEVOTHROID) 50 MCG tablet               . levothyroxine (SYNTHROID, LEVOTHROID) 50 MCG tablet   Oral   Take 50 mcg by mouth daily before breakfast.         . Liraglutide (VICTOZA) 18 MG/3ML SOPN   Subcutaneous   Inject 1.8 mg into the skin every morning.          Marland Kitchen losartan (COZAAR) 100 MG tablet               . losartan (COZAAR) 100 MG tablet   Oral   Take 50 mg by mouth every morning.          Marland Kitchen LYRICA 100 MG capsule                 Dispense as written.   . metFORMIN (GLUCOPHAGE) 1000 MG tablet   Oral   Take 1,000 mg by mouth 2 (two) times daily with a meal.         . metFORMIN (GLUCOPHAGE) 1000 MG tablet               . metoprolol succinate  (TOPROL-XL) 50 MG 24 hr tablet               . mupirocin ointment (BACTROBAN) 2 %               . nitroGLYCERIN (NITROSTAT) 0.4 MG SL tablet   Sublingual   Place 0.4 mg under the tongue every 5 (five) minutes as needed.         Marland Kitchen NITROSTAT 0.4 MG SL tablet                 Dispense as written.   Marland Kitchen NOVOLOG 100 UNIT/ML injection                 Dispense as written.   . pioglitazone (ACTOS) 15 MG tablet   Oral   Take 15 mg by mouth every morning.          . pioglitazone (ACTOS) 15 MG tablet               . pregabalin (LYRICA) 100 MG capsule   Oral   Take 100 mg by mouth 3 (three) times daily.         . Probiotic Product (PROBIOTIC & ACIDOPHILUS EX ST PO)   Oral   Take by mouth every morning. 100 milligrams with a meal         . rosuvastatin (CRESTOR) 10 MG tablet   Oral   Take 5 mg by mouth at bedtime. Pt takes 1/2 tablet (3m total)         . Testosterone (ANDROGEL PUMP TD)   Transdermal   Place onto the skin.         .Marland Kitchentestosterone (ANDROGEL) 50 MG/5GM (1%) GEL   Transdermal   Place 5 g onto the skin daily.         . traMADol (ULTRAM) 50 MG tablet   Oral   Take 1 tablet (50 mg total) by mouth every 6 (six) hours as needed.   20 tablet   0   . VICTOZA 18 MG/3ML SOPN  Dispense as written.     Allergies Review of patient's allergies indicates no known allergies.  Family History  Problem Relation Age of Onset  . Lung cancer Mother   . Heart attack Father   . Post-traumatic stress disorder Father   . Anxiety disorder Father   . Depression Father   . Heart attack Sister   . Heart attack Brother     Social History Social History  Substance Use Topics  . Smoking status: Never Smoker   . Smokeless tobacco: None  . Alcohol Use: No    Review of Systems Constitutional: No fever/chills Eyes: No visual changes. ENT: No sore throat. Cardiovascular: Severe central chest discomfort Respiratory:  Shortness of breath associated with the chest pain Gastrointestinal: No abdominal pain.  No nausea, no vomiting.  No diarrhea.  No constipation. Genitourinary: Negative for dysuria. Musculoskeletal: Negative for back pain. Skin: Negative for rash. Neurological: Negative for headaches, focal weakness or numbness.  10-point ROS otherwise negative.  ____________________________________________   PHYSICAL EXAM:  VITAL SIGNS: ED Triage Vitals  Enc Vitals Group     BP 02/19/15 1143 155/71 mmHg     Pulse Rate 02/19/15 1143 72     Resp --      Temp 02/19/15 1143 98.1 F (36.7 C)     Temp Source 02/19/15 1143 Oral     SpO2 02/19/15 1143 94 %     Weight 02/19/15 1143 280 lb (127.007 kg)     Height 02/19/15 1143 6' 2"  (1.88 m)     Head Cir --      Peak Flow --      Pain Score 02/19/15 1145 0     Pain Loc --      Pain Edu? --      Excl. in [place]? --     Constitutional: Alert and oriented. Well appearing and in no acute distress. Eyes: Conjunctivae are normal. PERRL. EOMI. Head: Atraumatic. Nose: No congestion/rhinnorhea. Mouth/Throat: Mucous membranes are moist.  Oropharynx non-erythematous. Neck: No stridor.   Cardiovascular: Normal rate, regular rhythm. Grossly normal heart sounds.  Good peripheral circulation. Respiratory: Normal respiratory effort.  No retractions. Lungs CTAB. Gastrointestinal: Morbid obesity.  Soft and nontender. No distention. No abdominal bruits. No CVA tenderness. Musculoskeletal: No lower extremity tenderness nor edema.  No joint effusions. Neurologic:  Normal speech and language. No gross focal neurologic deficits are appreciated.  Skin:  Skin is warm, dry and intact. No rash noted. Psychiatric: Mood and affect are normal. Speech and behavior are normal.  ____________________________________________   LABS (all labs ordered are listed, but only abnormal results are displayed)  Labs Reviewed  BASIC METABOLIC PANEL - Abnormal; Notable for the following:     Potassium 5.6 (*)    Glucose, Bld 253 (*)    BUN 31 (*)    All other components within normal limits  CBC - Abnormal; Notable for the following:    RBC 3.65 (*)    Hemoglobin 11.7 (*)    HCT 34.3 (*)    RDW 15.9 (*)    All other components within normal limits  TROPONIN I - Abnormal; Notable for the following:    Troponin I 0.04 (*)    All other components within normal limits  HEPATIC FUNCTION PANEL - Abnormal; Notable for the following:    Bilirubin, Direct <0.1 (*)    All other components within normal limits  TROPONIN I - Abnormal; Notable for the following:    Troponin I 0.05 (*)  All other components within normal limits  LIPASE, BLOOD  MAGNESIUM   ____________________________________________  EKG  ED ECG REPORT I, Maie Kesinger, the attending physician, personally viewed and interpreted this ECG.  Date: 02/19/2015 EKG Time: 11:32 Rate: 72 Rhythm: normal sinus rhythm QRS Axis: normal Intervals: normal ST/T Wave abnormalities: normal Conduction Disutrbances: none Narrative Interpretation: unremarkable  ____________________________________________  RADIOLOGY   Dg Chest 2 View  02/19/2015  CLINICAL DATA:  New onset chest pain earlier today, history of coronary stents. EXAM: CHEST  2 VIEW COMPARISON:  11/14/2014 FINDINGS: Normal heart size and vascularity. Lungs remain clear. No focal pneumonia, collapse or consolidation. Negative for edema, effusion or pneumothorax. Trachea midline. Degenerative changes of the spine. IMPRESSION: Stable chest exam.  No acute process. Electronically Signed   By: Jerilynn Mages.  Shick M.D.   On: 02/19/2015 12:31    ____________________________________________   PROCEDURES  Procedure(s) performed: None  Critical Care performed: No ____________________________________________   INITIAL IMPRESSION / ASSESSMENT AND PLAN / ED COURSE  Pertinent labs & imaging results that were available during my care of the patient were reviewed by  me and considered in my medical decision making (see chart for details).  The patient has known coronary artery disease and has had ischemic chest pain in the past.  He has close outpatient follow-up and has been evaluated recently by Dr. [name] in clinic with stress test.  He landed high risk for cardiac events, but he is currently asymptomatic and though his chest pain was likely ischemic in nature he has had multiple prior workups and is known to have ischemic chest pain.  ----------------------------------------- 6:15 PM on 02/19/2015 -----------------------------------------  His troponin is slightly elevated at 0.04 and 0.05, which is less than during his prior admission were no intervention was pursued.  The patient has been completely asymptomatic for the entirety of his 6 hour and 45 minute stay in the emergency department.  He has been resting comfortably and states that he feels "great".  I spoke by phone with Dr. Ubaldo Glassing and discussed the case.  Grade with my plan for discharge and outpatient follow-up.  He also recommended that I encourage the patient to take his nitroglycerin as previously written, and also recommended that I prescribed Imdur 30 mg daily.  I explained these recommendations to the patient and he understands and agrees.  I also gave him my usual and customary return precautions.   ____________________________________________  FINAL CLINICAL IMPRESSION(S) / ED DIAGNOSES  Final diagnoses:  Ischemic chest pain ([place])      NEW MEDICATIONS STARTED DURING THIS VISIT:  New Prescriptions   ISOSORBIDE MONONITRATE (IMDUR) 30 MG 24 HR TABLET    Take 1 tablet (30 mg total) by mouth daily.     Hinda Kehr, MD 02/19/15 1816

## 2015-02-19 NOTE — ED Notes (Signed)
Notified Dr. Karma Greaser of critical lab value, Troponin 0.04.

## 2015-04-14 ENCOUNTER — Other Ambulatory Visit: Payer: Self-pay

## 2015-04-14 DIAGNOSIS — F603 Borderline personality disorder: Secondary | ICD-10-CM | POA: Insufficient documentation

## 2015-04-14 DIAGNOSIS — F3160 Bipolar disorder, current episode mixed, unspecified: Secondary | ICD-10-CM | POA: Insufficient documentation

## 2015-04-14 MED ORDER — DULOXETINE HCL 60 MG PO CPEP: 60.0000 mg | ORAL_CAPSULE | Freq: Every day | ORAL | Status: DC

## 2015-04-14 NOTE — Telephone Encounter (Signed)
request for refill on duloxetine hcl dr 60 mg by mouth daily.  pt last seen on 10-26-14 next appt on  05-03-15

## 2015-04-27 ENCOUNTER — Ambulatory Visit (INDEPENDENT_AMBULATORY_CARE_PROVIDER_SITE_OTHER): Payer: Medicare HMO | Admitting: Psychiatry

## 2015-04-27 ENCOUNTER — Encounter: Payer: Self-pay | Admitting: Psychiatry

## 2015-04-27 DIAGNOSIS — F3170 Bipolar disorder, currently in remission, most recent episode unspecified: Secondary | ICD-10-CM

## 2015-04-27 MED ORDER — DIVALPROEX SODIUM ER 500 MG PO TB24: 500.0000 mg | ORAL_TABLET | Freq: Three times a day (TID) | ORAL | Status: DC

## 2015-04-27 MED ORDER — DULOXETINE HCL 60 MG PO CPEP: 60.0000 mg | ORAL_CAPSULE | Freq: Every day | ORAL | Status: DC

## 2015-05-28 LAB — HEPATIC FUNCTION PANEL
ALBUMIN: 4.5 g/dL (ref 3.6–4.8)
ALT: 18 IU/L (ref 0–44)
AST: 25 IU/L (ref 0–40)
Alkaline Phosphatase: 71 IU/L (ref 39–117)
BILIRUBIN TOTAL: 0.3 mg/dL (ref 0.0–1.2)
Bilirubin, Direct: 0.1 mg/dL (ref 0.00–0.40)
Total Protein: 7.5 g/dL (ref 6.0–8.5)

## 2015-05-28 LAB — VALPROIC ACID LEVEL: VALPROIC ACID LVL: 61 ug/mL (ref 50–100)

## 2015-06-02 ENCOUNTER — Telehealth: Payer: Self-pay | Admitting: Psychiatry

## 2015-06-18 ENCOUNTER — Encounter: Payer: Self-pay | Admitting: *Deleted

## 2015-06-18 NOTE — Progress Notes (Signed)
[facility] MD Progress Note  06/18/2015 4:53 PM Dennis Mullen  MRN:  [id number] Subjective:  Follow-up for 65 year old man with bipolar disorder. He presents himself as doing quite well. He says that his mood has been feeling very stable. He has not been suffering from irritability or anger nor depression. Not having any recent suicidal thoughts. He feels that his health is going pretty well. He is taking his medication without complication or side effects. Living arrangement seem to be satisfactory to him. Principal Problem: @PPROB @ Diagnosis:   Patient Active Problem List   Diagnosis Date Noted  . Bipolar 1 disorder, mixed ([place]) [F31.60] 04/14/2015  . Borderline personality disorder [F60.3] 04/14/2015  . Acute renal failure ([place]) [N17.9] 11/15/2014  . Chest pain [R07.9] 11/14/2014  . Dizziness [R42] 11/05/2014  . Benign essential HTN [I10] 11/04/2014  . 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 Bipolar 2 disorder ([place]) [F31.81] 10/27/2014  . Acquired hypothyroidism [E03.9] 02/03/2014  . Type 2 diabetes mellitus ([place]) [E11.9] 02/03/2014  . Adiposity [E66.9] 01/06/2014  . Obesity, diabetes, and hypertension syndrome ([place]) [E11.9, I10, E66.9] 12/23/2013  . Long term current use of insulin ([place]) [Z79.4] 12/23/2013  . Type 2 diabetes mellitus with other diabetic neurological complication ([place]) [V76.16] 12/23/2013  . Acute inflammation of the pancreas [K85.9] 11/18/2013  . Elevated cholesterol with elevated triglycerides [E78.2] 11/18/2013  . CAD in native artery [I25.10] 09/26/2013  . Diabetes mellitus ([place]) [E11.9] 09/26/2013  . Acute non-ST elevation myocardial infarction (NSTEMI) ([place]) [I21.4] 09/26/2013  . Non-ST elevation (NSTEMI) myocardial infarction [facility]) [I21.4] 09/26/2013   Total Time spent with patient: 20 minutes  Past Psychiatric History: Patient has a history of bipolar disorder with some episodes of suicidal ideation and mood irritability but  has been doing well on his combination mood stabilizer antidepressant.  Past Medical History:  Past Medical History  Diagnosis Date  . Obesity   . Diabetes mellitus, type II ([place])   . Thyroid disease   . Anxiety   . Bipolar disorder ([place])   . History of borderline personality disorder   . Depression   . Neuropathy ([place])   . Diabetes mellitus, type II, insulin dependent ([place])   . Hypertension   . CHF (congestive heart failure) ([place])     ?  Marland Kitchen CAD (coronary artery disease)   . Gout   . Mood disorder ([place])   . Insomnia   . PVD (peripheral vascular disease) ([place])   . Abnormal levels of other serum enzymes   . GERD (gastroesophageal reflux disease)   . BPH (benign prostatic hyperplasia)   . ED (erectile dysfunction)   . Hypogonadism in male     Past Surgical History  Procedure Laterality Date  . Tonsillectomy and adenoidectomy    . Nasal septum surgery    . Mastoidectomy Right   . Penial inplant    . Coronary angioplasty with stent placement      x5 stents  . Right mastoidectomy    . Penile prosthesis implant    . Foot surgery    . Scrotoplasty     Family History:  Family History  Problem Relation Age of Onset  . Lung cancer Mother   . Heart attack Father   . Post-traumatic stress disorder Father   . Anxiety disorder Father   . Depression Father   . Heart attack Sister   . Heart attack Brother    Family Psychiatric  History: Positive for depression Social History:  History  Alcohol  Use No     History  Drug Use No    Social History   Social History  . Marital Status: Single    Spouse Name: N/A  . Number of Children: N/A  . Years of Education: N/A   Social History Main Topics  . Smoking status: Never Smoker   . Smokeless tobacco: Never Used  . Alcohol Use: No  . Drug Use: No  . Sexual Activity: Not Currently    Birth Control/ Protection: None   Other Topics Concern  . None   Social History Narrative   ** Merged History Encounter **        Additional Social History:                         Sleep: Fair  Appetite:  Fair  Current Medications: Current Outpatient Prescriptions  Medication Sig Dispense Refill  . ACCU-CHEK SMARTVIEW test strip     . allopurinol (ZYLOPRIM) 100 MG tablet Take 100 mg by mouth every morning.    Marland Kitchen amLODipine (NORVASC) 5 MG tablet     . aspirin EC 81 MG tablet Take 81 mg by mouth every morning.     . clopidogrel (PLAVIX) 75 MG tablet Take 75 mg by mouth every morning.     . colchicine 0.6 MG tablet Take 0.6 mg by mouth 2 (two) times daily as needed.    . CRESTOR 10 MG tablet     . diclofenac (VOLTAREN) 75 MG EC tablet Take 75 mg by mouth 2 (two) times daily as needed.    . divalproex (DEPAKOTE ER) 500 MG 24 hr tablet Take 1 tablet (500 mg total) by mouth 3 (three) times daily between meals. 90 tablet 5  . DULoxetine (CYMBALTA) 60 MG capsule Take 1 capsule (60 mg total) by mouth daily. 30 capsule 5  . esomeprazole (NEXIUM) 40 MG capsule     . gemfibrozil (LOPID) 600 MG tablet Take 600 mg by mouth 2 (two) times daily before a meal.    . insulin aspart (NOVOLOG) 100 UNIT/ML injection Inject into the skin. Take up to 66 units dialy as directed via VGO kit which equals to 1.25 units per hour with  Boluses as needed (up to 12 units at meals)    . Insulin Disposable Pump (V-GO 30) KIT     . levothyroxine (SYNTHROID, LEVOTHROID) 50 MCG tablet Take 50 mcg by mouth daily before breakfast.    . Liraglutide (VICTOZA) 18 MG/3ML SOPN Inject 1.8 mg into the skin every morning.     Marland Kitchen losartan (COZAAR) 100 MG tablet Take 50 mg by mouth every morning.     Marland Kitchen LYRICA 100 MG capsule     . metFORMIN (GLUCOPHAGE) 1000 MG tablet Take 1,000 mg by mouth 2 (two) times daily with a meal.    . metoprolol succinate (TOPROL-XL) 50 MG 24 hr tablet     . mupirocin ointment (BACTROBAN) 2 %     . nitroGLYCERIN (NITROSTAT) 0.4 MG SL tablet Place 0.4 mg under the tongue every 5 (five) minutes as needed.    Marland Kitchen NOVOLOG 100  UNIT/ML injection     . pioglitazone (ACTOS) 15 MG tablet Take 15 mg by mouth every morning.     . pioglitazone (ACTOS) 15 MG tablet     . pregabalin (LYRICA) 100 MG capsule Take 100 mg by mouth 3 (three) times daily.    . Probiotic Product (PROBIOTIC & ACIDOPHILUS EX ST PO) Take by  mouth every morning. 100 milligrams with a meal    . rosuvastatin (CRESTOR) 10 MG tablet Take 5 mg by mouth at bedtime. Pt takes 1/2 tablet (35m total)    . traMADol (ULTRAM) 50 MG tablet Take 1 tablet (50 mg total) by mouth every 6 (six) hours as needed. 20 tablet 0  . VICTOZA 18 MG/3ML SOPN     . cephALEXin (KEFLEX) 250 MG capsule Reported on 04/27/2015    . furosemide (LASIX) 20 MG tablet Take 20 mg by mouth every morning. Reported on 04/27/2015    . isosorbide mononitrate (IMDUR) 30 MG 24 hr tablet Take 1 tablet (30 mg total) by mouth daily. (Patient not taking: Reported on 04/27/2015) 30 tablet 0  . testosterone (ANDROGEL) 50 MG/5GM (1%) GEL Place 5 g onto the skin daily. Reported on 04/27/2015     No current facility-administered medications for this visit.    Lab Results: No results found for this or any previous visit (from the past 48 hour(s)).  Blood Alcohol level:  No results found for: [facility] Physical Findings: AIMS:  , ,  ,  ,    CIWA:    COWS:     Musculoskeletal: Strength & Muscle Tone: within normal limits Gait & Station: normal Patient leans: N/A  Psychiatric Specialty Exam: ROS  There were no vitals taken for this visit.There is no weight on file to calculate BMI.  General Appearance: Fairly Groomed  EEngineer, water:  Good  Speech:  Normal Rate  Volume:  Normal  Mood:  Euthymic  Affect:  Congruent  Thought Process:  Goal Directed  Orientation:  Full (Time, Place, and Person)  Thought Content:  Negative  Suicidal Thoughts:  No  Homicidal Thoughts:  No  Memory:  Immediate;   Good Recent;   Good Remote;   Good  Judgement:  Good  Insight:  Good  Psychomotor Activity:  Normal   Concentration:  Good  Recall:  Good  Fund of Knowledge:Good  Language: Good  Akathisia:  No  Handed:  Right  AIMS (if indicated):     Assets:  Communication Skills Desire for Improvement Financial Resources/Insurance Housing Resilience Social Support  ADL's:  Intact  Cognition: WNL  Sleep:      Treatment Plan Summary: Medication management and Plan Patient is doing well on combination Depakote Cymbalta. Supportive counseling and review of medicine side effects. Patient will be planning to get his labs checked to check his hepatic function panel. Also Depakote level. Reviewed side effects and benefits. Continue current medicine and I will see him back in 6 months. He is agreeable to the plan.  JAlethia Berthold MD 06/18/2015, 4:53 PM

## 2015-06-29 ENCOUNTER — Ambulatory Visit (INDEPENDENT_AMBULATORY_CARE_PROVIDER_SITE_OTHER): Payer: Medicare HMO | Admitting: Urology

## 2015-06-29 ENCOUNTER — Encounter: Payer: Self-pay | Admitting: Urology

## 2015-06-29 VITALS — BP 153/77 | HR 70 | Ht 74.0 in | Wt 292.3 lb

## 2015-06-29 DIAGNOSIS — N529 Male erectile dysfunction, unspecified: Secondary | ICD-10-CM | POA: Insufficient documentation

## 2015-06-29 DIAGNOSIS — N401 Enlarged prostate with lower urinary tract symptoms: Secondary | ICD-10-CM

## 2015-06-29 DIAGNOSIS — N138 Other obstructive and reflux uropathy: Secondary | ICD-10-CM

## 2015-06-29 DIAGNOSIS — N528 Other male erectile dysfunction: Secondary | ICD-10-CM

## 2015-06-29 DIAGNOSIS — E291 Testicular hypofunction: Secondary | ICD-10-CM

## 2015-06-29 NOTE — Progress Notes (Addendum)
06/29/2015 9:57 AM   [name] [facility] 1950/08/14 [id number]  Referring provider: Lavera Guise, MD [address] [place], [place] [address]  Chief Complaint  Patient presents with  . Hypogonadism  . Erectile Dysfunction    HPI: Patient is a 65 year old Caucasian male with a history of hypogonadism, erectile dysfunction and BPH with L UTS who presents today to reinitiate his testosterone therapy.  Hypogonadism Patient is experiencing a decrease in libido, a lack of energy, a decrease in strength, a loss in height, a decreased enjoyment in life,  erections being less strong and a recent deterioration in an ability to play sports.  This is indicated by his responses to the ADAM questionnaire.  He  managed his hypogonadism with AndroGel in the past with good results, but he hasn't had the medication for over a year.         Androgen Deficiency in the Aging Male      06/29/15 0900       Androgen Deficiency in the Aging Male   Do you have a decrease in libido (sex drive) Yes     Do you have lack of energy Yes     Do you have a decrease in strength and/or endurance Yes     Have you lost height Yes     Have you noticed a decreased "enjoyment of life" Yes     Are you sad and/or grumpy No     Are your erections less strong Yes     Have you noticed a recent deterioration in your ability to play sports Yes     Are you falling asleep after dinner No     Has there been a recent deterioration in your work performance No        Erectile dysfunction His SHIM score is 23, which is no erectile dysfunction.  He underwent a penile prothesis approximately three years ago with Dr. Yves Dill.  He has been under whelmed with the prothesis since its insertion.  He is not interested in a reimplant at this time.       SHIM      06/29/15 0916       SHIM: Over the last 6 months:   How do you rate your confidence that you could get and keep an erection? Very High     When you had erections with  sexual stimulation, how often were your erections hard enough for penetration (entering your partner)? Most Times (much more than half the time)     During sexual intercourse, how often were you able to maintain your erection after you had penetrated (entered) your partner? Slightly Difficult     During sexual intercourse, how difficult was it to maintain your erection to completion of intercourse? Not Difficult     When you attempted sexual intercourse, how often was it satisfactory for you? Not Difficult     SHIM Total Score   SHIM 23        Score: 1-7 Severe ED 8-11 Moderate ED 12-16 Mild-Moderate ED 17-21 Mild ED 22-25 No ED   BPH WITH LUTS His IPSS score today is 13, which is moderate lower urinary tract symptomatology. He is mostly satisfied with his quality life due to his urinary symptoms.  He denies any dysuria, hematuria or suprapubic pain.  He also denies any recent fevers, chills, nausea or vomiting.  He has a family history of PCa.        IPSS  06/29/15 0900       International Prostate Symptom Score   How often have you had the sensation of not emptying your bladder? About half the time     How often have you had to urinate less than every two hours? Less than 1 in 5 times     How often have you found you stopped and started again several times when you urinated? Less than half the time     How often have you found it difficult to postpone urination? Less than 1 in 5 times     How often have you had a weak urinary stream? About half the time     How often have you had to strain to start urination? Less than half the time     How many times did you typically get up at night to urinate? 1 Time     Total IPSS Score 13     Quality of Life due to urinary symptoms   If you were to spend the rest of your life with your urinary condition just the way it is now how would you feel about that? Mostly Satisfied        Score:  1-7 Mild 8-19 Moderate 20-35  Severe  PMH: Past Medical History  Diagnosis Date  . Obesity   . Diabetes mellitus, type II ([place])   . Thyroid disease   . Anxiety   . Bipolar disorder ([place])   . History of borderline personality disorder   . Depression   . Neuropathy ([place])   . Diabetes mellitus, type II, insulin dependent ([place])   . Hypertension   . CHF (congestive heart failure) ([place])     ?  Marland Kitchen CAD (coronary artery disease)   . Gout   . Mood disorder ([place])   . Insomnia   . PVD (peripheral vascular disease) ([place])   . Abnormal levels of other serum enzymes   . GERD (gastroesophageal reflux disease)   . BPH (benign prostatic hyperplasia)   . ED (erectile dysfunction)   . Hypogonadism in male     Surgical History: Past Surgical History  Procedure Laterality Date  . Tonsillectomy and adenoidectomy    . Nasal septum surgery    . Mastoidectomy Right   . Penial inplant    . Coronary angioplasty with stent placement      x5 stents  . Right mastoidectomy    . Penile prosthesis implant    . Foot surgery    . Scrotoplasty      Home Medications:    Medication List       This list is accurate as of: 06/29/15  9:57 AM.  Always use your most recent med list.               ACCU-CHEK SMARTVIEW test strip  Generic drug:  glucose blood     allopurinol 100 MG tablet  Commonly known as:  ZYLOPRIM  Take 100 mg by mouth every morning.     amLODipine 5 MG tablet  Commonly known as:  NORVASC     aspirin EC 81 MG tablet  Take 81 mg by mouth every morning.     clopidogrel 75 MG tablet  Commonly known as:  PLAVIX  Take 75 mg by mouth every morning.     colchicine 0.6 MG tablet  Take 0.6 mg by mouth 2 (two) times daily as needed.     diclofenac 75 MG EC tablet  Commonly known as:  VOLTAREN  Take 75 mg by mouth 2 (two) times daily as needed.     divalproex 500 MG 24 hr tablet  Commonly known as:  DEPAKOTE ER  Take 1 tablet (500 mg total) by mouth 3 (three) times daily between meals.     DULoxetine 60  MG capsule  Commonly known as:  CYMBALTA  Take 1 capsule (60 mg total) by mouth daily.     esomeprazole 40 MG capsule  Commonly known as:  NEXIUM     gemfibrozil 600 MG tablet  Commonly known as:  LOPID  Take 600 mg by mouth 2 (two) times daily before a meal.     insulin aspart 100 UNIT/ML injection  Commonly known as:  novoLOG  Inject into the skin. Take up to 66 units dialy as directed via VGO kit which equals to 1.25 units per hour with  Boluses as needed (up to 12 units at meals)     NOVOLOG 100 UNIT/ML injection  Generic drug:  insulin aspart     levothyroxine 50 MCG tablet  Commonly known as:  SYNTHROID, LEVOTHROID  Take 50 mcg by mouth daily before breakfast.     losartan 100 MG tablet  Commonly known as:  COZAAR  Take 50 mg by mouth every morning.     metFORMIN 1000 MG tablet  Commonly known as:  GLUCOPHAGE  Take 1,000 mg by mouth 2 (two) times daily with a meal.     metoprolol succinate 50 MG 24 hr tablet  Commonly known as:  TOPROL-XL     nitroGLYCERIN 0.4 MG SL tablet  Commonly known as:  NITROSTAT  Place 0.4 mg under the tongue every 5 (five) minutes as needed.     pioglitazone 15 MG tablet  Commonly known as:  ACTOS  Take 15 mg by mouth every morning.     pregabalin 100 MG capsule  Commonly known as:  LYRICA  Take 100 mg by mouth 3 (three) times daily.     LYRICA 100 MG capsule  Generic drug:  pregabalin     rosuvastatin 10 MG tablet  Commonly known as:  CRESTOR  Take 5 mg by mouth at bedtime. Pt takes 1/2 tablet (79m total)     CRESTOR 10 MG tablet  Generic drug:  rosuvastatin     testosterone 50 MG/5GM (1%) Gel  Commonly known as:  ANDROGEL  Place 5 g onto the skin daily. Reported on 04/27/2015     V-GO 30 Kit  Reported on 06/29/2015     VICTOZA 18 MG/3ML Sopn  Generic drug:  Liraglutide  Inject 1.8 mg into the skin every morning.     VICTOZA 18 MG/3ML Sopn  Generic drug:  Liraglutide        Allergies: No Known Allergies  Family  History: Family History  Problem Relation Age of Onset  . Lung cancer Mother   . Heart attack Father   . Post-traumatic stress disorder Father   . Anxiety disorder Father   . Depression Father   . Heart attack Sister   . Heart attack Brother     Social History:  reports that he has never smoked. He has never used smokeless tobacco. He reports that he does not drink alcohol or use illicit drugs.  ROS: UROLOGY Frequent Urination?: No Hard to postpone urination?: No Burning/pain with urination?: No Get up at night to urinate?: Yes Leakage of urine?: No Urine stream starts and stops?: No Trouble starting stream?: Yes Do you have to strain to urinate?:  No Blood in urine?: No Urinary tract infection?: No Sexually transmitted disease?: No Injury to kidneys or bladder?: No Painful intercourse?: No Weak stream?: No Erection problems?: No Penile pain?: Yes  Gastrointestinal Nausea?: No Vomiting?: No Indigestion/heartburn?: No Diarrhea?: No Constipation?: No  Constitutional Fever: No Night sweats?: No Weight loss?: No Fatigue?: No  Skin Skin rash/lesions?: No Itching?: No  Eyes Blurred vision?: No Double vision?: No  Ears/Nose/Throat Sore throat?: No Sinus problems?: No  Hematologic/Lymphatic Swollen glands?: No Easy bruising?: Yes  Cardiovascular Leg swelling?: Yes Chest pain?: Yes  Respiratory Cough?: No Shortness of breath?: No  Endocrine Excessive thirst?: No  Musculoskeletal Back pain?: Yes Joint pain?: No  Neurological Headaches?: No Dizziness?: No  Psychologic Depression?: Yes Anxiety?: Yes  Physical Exam: BP 153/77 mmHg  Pulse 70  Ht 6' 2"  (1.88 m)  Wt 292 lb 4.8 oz (132.586 kg)  BMI 37.51 kg/m2  Constitutional: Well nourished. Alert and oriented, No acute distress. HEENT: [place] AT, moist mucus membranes. Trachea midline, no masses. Cardiovascular: No clubbing, cyanosis, or edema. Respiratory: Normal respiratory effort, no  increased work of breathing. GI: Abdomen is soft, non tender, non distended, no abdominal masses. Liver and spleen not palpable.  No hernias appreciated.  Stool sample for occult testing is not indicated.   GU: No CVA tenderness.  No bladder fullness or masses.  Patient with circumcised phallus.   Urethral meatus is patent.  No penile discharge. No penile lesions or rashes.  Penile prothesis cylinders in place.  Tubing palpated on the ventral side.  Scrotum without lesions, cysts, rashes and/or edema.  Penile prothesis pump located in the scrotal sac. Testicles are located scrotally bilaterally. No masses are appreciated in the testicles. Left and right epididymis are normal. Rectal: Patient with  normal sphincter tone. Anus and perineum without scarring or rashes. No rectal masses are appreciated. Prostate is approximately 45 grams, no nodules are appreciated. Seminal vesicles are normal. Skin: No rashes, bruises or suspicious lesions. Lymph: No cervical or inguinal adenopathy. Neurologic: Grossly intact, no focal deficits, moving all 4 extremities. Psychiatric: Normal mood and affect.  Laboratory Data: Lab Results  Component Value Date   WBC 9.9 02/19/2015   HGB 11.7* 02/19/2015   HCT 34.3* 02/19/2015   MCV 94.0 02/19/2015   PLT 253 02/19/2015    Lab Results  Component Value Date   CREATININE 1.23 02/19/2015   PSA History  1.8 ng/mL on 08/26/2013  1.4 ng/mL on 04/27/2012  1.4 ng/mL on 11/22/2011  Lab Results  Component Value Date   HGBA1C 6.4* 11/14/2014    Lab Results  Component Value Date   TSH 1.441 11/14/2014       Component Value Date/Time   CHOL 223* 01/29/2014 0553   HDL 34* 01/29/2014 0553   VLDL SEE COMMENT 01/29/2014 0553   [place] SEE COMMENT 01/29/2014 0553    Lab Results  Component Value Date   AST 25 05/27/2015   Lab Results  Component Value Date   ALT 18 05/27/2015    Assessment & Plan:    1. Hypogonadism:  I explained to patient because he  had been without the AndroGel for over a year we would need to obtain 2 morning serum testosterone draws before 9 AM,  2 days apart, returned below the laboratories parameter for normal testosterone before we could restart the AndroGel.  We obtained a morning testosterone before 9 AM today and he will return and Friday for his second serum testosterone draw before 9 AM.  -  Testosterone  2. Erectile dysfunction:   SHIM score is 23.  He has a penile prosthesis in place, but he has been under whelmed with the effectiveness of the device.  We discussed a possible reimplantation, but he is not interested in undergoing another surgical procedure at this time.  3. BPH with LUTS:   IPSS score is 13.  We will continue to monitor.  He will have his IPSS score, exam and PSA in 6 months if he restarts his AndroGel therapy.    - PSA  Return for patient will be returning on Friday morning before 9 AM for a testosterone blood draw.  These notes generated with voice recognition software. I apologize for typographical errors.  Zara Council, [facility] [address] [place], [place] [address] [phone]

## 2015-07-01 ENCOUNTER — Telehealth: Payer: Self-pay | Admitting: *Deleted

## 2015-07-01 LAB — TESTOSTERONE: TESTOSTERONE: 190 ng/dL — AB (ref 348–1197)

## 2015-07-01 LAB — PSA: PROSTATE SPECIFIC AG, SERUM: 4.8 ng/mL — AB (ref 0.0–4.0)

## 2015-07-01 NOTE — Telephone Encounter (Signed)
LMOM for patient to call back regarding labs.

## 2015-07-01 NOTE — Telephone Encounter (Signed)
-----   Message from Nori Riis, PA-C sent at 07/01/2015 12:22 PM EDT ----- Patient's PSA has risen significantly since 2015.  I would like it repeated when he returns for his second morning testosterone draw.

## 2015-07-02 ENCOUNTER — Other Ambulatory Visit: Payer: Medicare HMO

## 2015-07-02 DIAGNOSIS — R972 Elevated prostate specific antigen [PSA]: Secondary | ICD-10-CM

## 2015-07-02 DIAGNOSIS — E291 Testicular hypofunction: Secondary | ICD-10-CM

## 2015-07-02 NOTE — Telephone Encounter (Signed)
Patient came in for lab draw for Testosterone this am and I went over his PSA results and explained why we want to draw another psa on him. Patient ok with plan.

## 2015-07-03 LAB — TESTOSTERONE: Testosterone: 220 ng/dL — ABNORMAL LOW (ref 348–1197)

## 2015-07-03 LAB — PSA: PROSTATE SPECIFIC AG, SERUM: 3.7 ng/mL (ref 0.0–4.0)

## 2015-07-05 ENCOUNTER — Telehealth: Payer: Self-pay

## 2015-07-05 NOTE — Telephone Encounter (Signed)
Spoke with Cory@ [facility] and had tests added on , patient notified and he states he has never had a history of elevated PSA personally but he has a family history of elevated PSA and prostate cancer with an uncle. He states we should have records from Dr. Eliberto Ivory he signed them a year ago, looking in allscripts we do have records I will print and put on your desk for review

## 2015-07-05 NOTE — Telephone Encounter (Signed)
I will take care of scheduling the procedure. Please get clearance to hold ASA & Plavix.

## 2015-07-05 NOTE — Telephone Encounter (Signed)
Spoke with patient and schd his appt for 07-26-15 and i mailed the instructions and appts to him. I went over them on the phone and Amy forwarded a message to the clinical staff to get a medical clearance from his cardiologist.  Sharyn Lull

## 2015-07-05 NOTE — Telephone Encounter (Signed)
I have spoken with the patient and recommended a prostate biopsy.  He is agreeable with this plan.  I have explained the procedure and the risks involved.  He is on Plavix and ASA daily.  He will need to discontinue this medication 10 days prior to the procedure.

## 2015-07-05 NOTE — Telephone Encounter (Signed)
-----   Message from Nori Riis, PA-C sent at 07/04/2015  8:58 PM EDT ----- Patient's PSA is still higher than 0.75 ng/mL over one year.  He states he has a history of elevated PSA's with his previous urologist.  I would like those records.  Also, we need to add estradiol, FSH/LH and prolactin to his blood work.

## 2015-07-05 NOTE — Telephone Encounter (Signed)
Yes , I can send them to Dr Radford Pax

## 2015-07-06 NOTE — Telephone Encounter (Signed)
Spoke with patient and he states his cardio is Dennis Mullen, a clearance form was faxed to Dr. Alveria Apley office and I left a message for his nurse in regards to getting clearance prior to the biopsy.Titus Dubin

## 2015-07-07 LAB — ESTRADIOL

## 2015-07-07 LAB — FSH/LH

## 2015-07-07 LAB — SPECIMEN STATUS REPORT

## 2015-07-07 LAB — PROLACTIN

## 2015-07-12 NOTE — Telephone Encounter (Signed)
Called Dr. Alveria Apley office to check the status of the clearance and left a message for his nurse again. Claiborne Billings called back and stated that she never received the clearance or my previous message. The clearance request was refaxed with [facility] attention per her request.

## 2015-07-13 NOTE — Telephone Encounter (Signed)
Received clearance from Dr. Alveria Apley office, patient is ok to stop ASA & Plavix 7 days prior to procedure. Left pt mess to call /SW

## 2015-07-14 NOTE — Telephone Encounter (Signed)
Patient notified to stop ASA and Plavix 7 days prior to procedure per Dr. [name]

## 2015-07-26 ENCOUNTER — Other Ambulatory Visit: Payer: Self-pay | Admitting: Urology

## 2015-07-26 ENCOUNTER — Ambulatory Visit (INDEPENDENT_AMBULATORY_CARE_PROVIDER_SITE_OTHER): Payer: Medicare HMO | Admitting: Urology

## 2015-07-26 VITALS — BP 162/74 | HR 76 | Ht 74.0 in | Wt 288.9 lb

## 2015-07-26 DIAGNOSIS — N529 Male erectile dysfunction, unspecified: Secondary | ICD-10-CM

## 2015-07-26 DIAGNOSIS — E291 Testicular hypofunction: Secondary | ICD-10-CM | POA: Diagnosis not present

## 2015-07-26 DIAGNOSIS — N528 Other male erectile dysfunction: Secondary | ICD-10-CM | POA: Diagnosis not present

## 2015-07-26 DIAGNOSIS — R972 Elevated prostate specific antigen [PSA]: Secondary | ICD-10-CM | POA: Diagnosis not present

## 2015-07-26 MED ORDER — GENTAMICIN SULFATE 40 MG/ML IJ SOLN
80.0000 mg | Freq: Once | INTRAMUSCULAR | Status: AC
Start: 2015-07-26 — End: 2015-07-26
  Administered 2015-07-26: 80 mg via INTRAMUSCULAR

## 2015-07-26 MED ORDER — LEVOFLOXACIN 500 MG PO TABS
500.0000 mg | ORAL_TABLET | Freq: Once | ORAL | Status: AC
  Administered 2015-07-26: 500 mg via ORAL

## 2015-07-26 NOTE — Progress Notes (Signed)
07/26/2015 1:38 PM   Dennis Mullen [facility] 1950-11-24 [id number]  Referring provider: Lavera Guise, MD [address] [place], [place] [address]  Chief Complaint  Patient presents with  . Biopsy    prostate biopsy    HPI:  1 - Elevated PSA - PSA 4.8, 3.7 in setting of testosterone <200 2017 ===> BIOPSY TRUS 28 mL / scattered calcifications.  2 - Hypogonadism - T levels <200, was on androgel previously but not at present. Has CAD.  3 - Erectile Dysfunction - s/p penile prosthesis by Dennis Mullen previously. Uses rarely and "underwhealmed" with result.   Today "Dennis Mullen" is seen to proceed with prostate biopsy. He took ABX and enema as directed.   PMH: Past Medical History  Diagnosis Date  . Obesity   . Diabetes mellitus, type II (Dennis Mullen)   . Thyroid disease   . Anxiety   . Bipolar disorder (Dennis Mullen)   . History of borderline personality disorder   . Depression   . Neuropathy (Dennis Mullen)   . Diabetes mellitus, type II, insulin dependent (Dennis Mullen)   . Hypertension   . CHF (congestive heart failure) (Dennis Mullen)     ?  Marland Kitchen CAD (coronary artery disease)   . Gout   . Mood disorder (Dennis Mullen)   . Insomnia   . PVD (peripheral vascular disease) (Dennis Mullen)   . Abnormal levels of other serum enzymes   . GERD (gastroesophageal reflux disease)   . BPH (benign prostatic hyperplasia)   . ED (erectile dysfunction)   . Hypogonadism in male     Surgical History: Past Surgical History  Procedure Laterality Date  . Tonsillectomy and adenoidectomy    . Nasal septum surgery    . Mastoidectomy Right   . Penial inplant    . Coronary angioplasty with stent placement      x5 stents  . Right mastoidectomy    . Penile prosthesis implant    . Foot surgery    . Scrotoplasty      Home Medications:    Medication List       This list is accurate as of: 07/26/15  1:38 PM.  Always use your most recent med list.               ACCU-CHEK SMARTVIEW test strip  Generic drug:  glucose blood     allopurinol 100 MG tablet    Commonly known as:  ZYLOPRIM  Take 100 mg by mouth every morning.     amLODipine 5 MG tablet  Commonly known as:  NORVASC     aspirin EC 81 MG tablet  Take 81 mg by mouth every morning.     clopidogrel 75 MG tablet  Commonly known as:  PLAVIX  Take 75 mg by mouth every morning.     colchicine 0.6 MG tablet  Take 0.6 mg by mouth 2 (two) times daily as needed.     diclofenac 75 MG EC tablet  Commonly known as:  VOLTAREN  Take 75 mg by mouth 2 (two) times daily as needed.     divalproex 500 MG 24 hr tablet  Commonly known as:  DEPAKOTE ER  Take 1 tablet (500 mg total) by mouth 3 (three) times daily between meals.     DULoxetine 60 MG capsule  Commonly known as:  CYMBALTA  Take 1 capsule (60 mg total) by mouth daily.     esomeprazole 40 MG capsule  Commonly known as:  NEXIUM     gemfibrozil 600 MG tablet  Commonly  known as:  LOPID  Take 600 mg by mouth 2 (two) times daily before a meal.     insulin aspart 100 UNIT/ML injection  Commonly known as:  novoLOG  Inject into the skin. Take up to 66 units dialy as directed via VGO kit which equals to 1.25 units per hour with  Boluses as needed (up to 12 units at meals)     NOVOLOG 100 UNIT/ML injection  Generic drug:  insulin aspart     levothyroxine 50 MCG tablet  Commonly known as:  SYNTHROID, LEVOTHROID  Take 50 mcg by mouth daily before breakfast.     losartan 100 MG tablet  Commonly known as:  COZAAR  Take 50 mg by mouth every morning.     metFORMIN 1000 MG tablet  Commonly known as:  GLUCOPHAGE  Take 1,000 mg by mouth 2 (two) times daily with a meal.     metoprolol succinate 50 MG 24 hr tablet  Commonly known as:  TOPROL-XL     nitroGLYCERIN 0.4 MG SL tablet  Commonly known as:  NITROSTAT  Place 0.4 mg under the tongue every 5 (five) minutes as needed.     pioglitazone 15 MG tablet  Commonly known as:  ACTOS  Take 15 mg by mouth every morning.     pregabalin 100 MG capsule  Commonly known as:  LYRICA   Take 100 mg by mouth 3 (three) times daily.     LYRICA 100 MG capsule  Generic drug:  pregabalin     rosuvastatin 10 MG tablet  Commonly known as:  CRESTOR  Take 5 mg by mouth at bedtime. Pt takes 1/2 tablet (10m total)     CRESTOR 10 MG tablet  Generic drug:  rosuvastatin     testosterone 50 MG/5GM (1%) Gel  Commonly known as:  ANDROGEL  Place 5 g onto the skin daily. Reported on 04/27/2015     V-GO 30 Kit  Reported on 06/29/2015     VICTOZA 18 MG/3ML Sopn  Generic drug:  Liraglutide  Inject 1.8 mg into the skin every morning.     VICTOZA 18 MG/3ML Sopn  Generic drug:  Liraglutide        Allergies: No Known Allergies  Family History: Family History  Problem Relation Age of Onset  . Lung cancer Mother   . Heart attack Father   . Post-traumatic stress disorder Father   . Anxiety disorder Father   . Depression Father   . Heart attack Sister   . Heart attack Brother     Social History:  reports that he has never smoked. He has never used smokeless tobacco. He reports that he does not drink alcohol or use illicit drugs.   Review of Systems  Gastrointestinal (upper)  : Negative for upper GI symptoms  Gastrointestinal (lower) : Negative for lower GI symptoms  Constitutional : Fatigue  Skin: Negative for skin symptoms  Eyes: Negative for eye symptoms  Ear/Nose/Throat : Negative for Ear/Nose/Throat symptoms  Hematologic/Lymphatic: Negative for Hematologic/Lymphatic symptoms  Cardiovascular : Negative for cardiovascular symptoms  Respiratory : Negative for respiratory symptoms  Endocrine: Negative for endocrine symptoms  Musculoskeletal: Negative for musculoskeletal symptoms  Neurological: Negative for neurological symptoms  Psychologic: Negative for psychiatric symptoms Physical Exam: BP 162/74 mmHg  Pulse 76  Ht 6' 2"  (1.88 m)  Wt 288 lb 14.4 oz (131.044 kg)  BMI 37.08 kg/m2  Constitutional:  Alert and oriented, No acute  distress. HEENT: [place] AT, moist mucus membranes.  Trachea  midline, no masses. Cardiovascular: No clubbing, cyanosis, or edema. Respiratory: Normal respiratory effort, no increased work of breathing. GI: Abdomen is soft, nontender, nondistended, no abdominal masses. Significant truncal obesity.  GU: No CVA tenderness.  Skin: No rashes, bruises or suspicious lesions. Lymph: No cervical or inguinal adenopathy. Neurologic: Grossly intact, no focal deficits, moving all 4 extremities. Psychiatric: Normal mood and affect.  Laboratory Data: Lab Results  Component Value Date   WBC 9.9 02/19/2015   HGB 11.7* 02/19/2015   HCT 34.3* 02/19/2015   MCV 94.0 02/19/2015   PLT 253 02/19/2015    Lab Results  Component Value Date   CREATININE 1.23 02/19/2015    No results found for: PSA  Lab Results  Component Value Date   TESTOSTERONE 220* 07/02/2015    Lab Results  Component Value Date   HGBA1C 6.4* 11/14/2014    Urinalysis    Component Value Date/Time   COLORURINE YELLOW* 11/14/2014 1415   COLORURINE Yellow 01/26/2014 0759   APPEARANCEUR CLEAR* 11/14/2014 1415   APPEARANCEUR Clear 01/26/2014 0759   LABSPEC 1.013 11/14/2014 1415   LABSPEC 1.021 01/26/2014 0759   PHURINE 5.0 11/14/2014 1415   PHURINE 5.0 01/26/2014 0759   GLUCOSEU NEGATIVE 11/14/2014 1415   GLUCOSEU >=500 01/26/2014 0759   HGBUR NEGATIVE 11/14/2014 1415   HGBUR Negative 01/26/2014 0759   BILIRUBINUR NEGATIVE 11/14/2014 1415   BILIRUBINUR Negative 01/26/2014 0759   KETONESUR NEGATIVE 11/14/2014 1415   KETONESUR Negative 01/26/2014 0759   PROTEINUR NEGATIVE 11/14/2014 1415   PROTEINUR Negative 01/26/2014 0759   NITRITE NEGATIVE 11/14/2014 1415   NITRITE Negative 01/26/2014 0759   LEUKOCYTESUR NEGATIVE 11/14/2014 1415   LEUKOCYTESUR Negative 01/26/2014 0759    Prostate Biopsy Procedure   Informed consent was obtained after discussing risks/benefits of the procedure.  A time out was performed to ensure  correct patient identity.  Pre-Procedure: - Last PSA Level: No results found for: PSA - Gentamicin given prophylactically - Levaquin 500 mg administered PO -Transrectal Ultrasound performed revealing a 28 gm prostate -No significant hypoechoic or median lobe noted. - Some likely calcificaiotns notes  Procedure: - Prostate block performed using 10 cc 1% lidocaine and biopsies taken from sextant areas, a total of 12 under ultrasound guidance.  Post-Procedure: - Patient tolerated the procedure well - He was counseled to seek immediate medical attention if experiences any severe pain, significant bleeding, or fevers - Return in one week to discuss biopsy results    Assessment & Plan:    1 - Elevated PSA - BX today as per above. Warned to contact MD for fever >101, large volume ongoing bleeding, or acute change in medical status.   2 - Hypogonadism - Tgree with non-replacement, especially in setting of significant CV comorbidity and elevated PSA.  3 - Erectile Dysfunction - uses IPP prn.  4 - RTC 2-3 weeks for post-biopsy discussion.    No Follow-up on file.  Alexis Frock, [facility] [address], [place], [place] [address] [phone]

## 2015-07-26 NOTE — Addendum Note (Signed)
Addended by: Wilson Singer on: 07/26/2015 02:40 PM   Modules accepted: Orders

## 2015-07-31 LAB — PATHOLOGY REPORT

## 2015-08-03 ENCOUNTER — Telehealth: Payer: Self-pay

## 2015-08-03 ENCOUNTER — Ambulatory Visit: Payer: Self-pay | Admitting: Urology

## 2015-08-03 ENCOUNTER — Ambulatory Visit (INDEPENDENT_AMBULATORY_CARE_PROVIDER_SITE_OTHER): Payer: Medicare HMO | Admitting: Urology

## 2015-08-03 VITALS — BP 138/70 | HR 78 | Wt 288.0 lb

## 2015-08-03 DIAGNOSIS — R972 Elevated prostate specific antigen [PSA]: Secondary | ICD-10-CM

## 2015-08-03 DIAGNOSIS — N528 Other male erectile dysfunction: Secondary | ICD-10-CM | POA: Diagnosis not present

## 2015-08-03 DIAGNOSIS — N529 Male erectile dysfunction, unspecified: Secondary | ICD-10-CM

## 2015-08-03 DIAGNOSIS — N401 Enlarged prostate with lower urinary tract symptoms: Secondary | ICD-10-CM | POA: Diagnosis not present

## 2015-08-03 DIAGNOSIS — E291 Testicular hypofunction: Secondary | ICD-10-CM

## 2015-08-03 DIAGNOSIS — N138 Other obstructive and reflux uropathy: Secondary | ICD-10-CM

## 2015-08-03 NOTE — Telephone Encounter (Signed)
Per Dr. Tresa Moore patient was notified that biopsy results were negative and patient could be rescheduled for 1year with PSA. Patient had questions in regards to restarting his testosterone now that he is cleared from the elevated PSA. Patient was told to keep today's apt and discuss this with Dr. Tresa Moore

## 2015-08-03 NOTE — Progress Notes (Signed)
07/26/2015 1:38 PM   D[facility] Mullen [facility] 05-May-1950 [id number]  Referring provider: Lavera Guise, MD [address] [place], [place] [address]  Chief Complaint  Patient presents with  . Biopsy    prostate biopsy    HPI:  1 - Elevated PSA - PSA 4.8, 3.7 in setting of testosterone <200 2017 ===> NEGATIVE BIOPSY TRUS 28 mL / scattered calcifications.  2 - Hypogonadism - T levels <200, was on androgel previously but not at present. Has CAD. Did not notice significant change in fatigue / mood / libido on vs. Off therapy. Remains off.   3 - Erectile Dysfunction - s/p penile prosthesis by Dennis Mullen previously. Uses rarely and "underwhealmed" with result, but is functional.  Today "Dennis Mullen" is seen in f/u above.   PMH: Past Medical History  Diagnosis Date  . Obesity   . Diabetes mellitus, type II ([place])   . Thyroid disease   . Anxiety   . Bipolar disorder ([place])   . History of borderline personality disorder   . Depression   . Neuropathy ([place])   . Diabetes mellitus, type II, insulin dependent ([place])   . Hypertension   . CHF (congestive heart failure) ([place])     ?  Marland Kitchen CAD (coronary artery disease)   . Gout   . Mood disorder ([place])   . Insomnia   . PVD (peripheral vascular disease) ([place])   . Abnormal levels of other serum enzymes   . GERD (gastroesophageal reflux disease)   . BPH (benign prostatic hyperplasia)   . ED (erectile dysfunction)   . Hypogonadism in male     Surgical History: Past Surgical History  Procedure Laterality Date  . Tonsillectomy and adenoidectomy    . Nasal septum surgery    . Mastoidectomy Right   . Penial inplant    . Coronary angioplasty with stent placement      x5 stents  . Right mastoidectomy    . Penile prosthesis implant    . Foot surgery    . Scrotoplasty      Home Medications:    Medication List       This list is accurate as of: 07/26/15  1:38 PM.  Always use your most recent med list.               ACCU-CHEK SMARTVIEW test  strip  Generic drug:  glucose blood     allopurinol 100 MG tablet  Commonly known as:  ZYLOPRIM  Take 100 mg by mouth every morning.     amLODipine 5 MG tablet  Commonly known as:  NORVASC     aspirin EC 81 MG tablet  Take 81 mg by mouth every morning.     clopidogrel 75 MG tablet  Commonly known as:  PLAVIX  Take 75 mg by mouth every morning.     colchicine 0.6 MG tablet  Take 0.6 mg by mouth 2 (two) times daily as needed.     diclofenac 75 MG EC tablet  Commonly known as:  VOLTAREN  Take 75 mg by mouth 2 (two) times daily as needed.     divalproex 500 MG 24 hr tablet  Commonly known as:  DEPAKOTE ER  Take 1 tablet (500 mg total) by mouth 3 (three) times daily between meals.     DULoxetine 60 MG capsule  Commonly known as:  CYMBALTA  Take 1 capsule (60 mg total) by mouth daily.     esomeprazole 40 MG capsule  Commonly known as:  NEXIUM     gemfibrozil 600 MG tablet  Commonly known as:  LOPID  Take 600 mg by mouth 2 (two) times daily before a meal.     insulin aspart 100 UNIT/ML injection  Commonly known as:  novoLOG  Inject into the skin. Take up to 66 units dialy as directed via VGO kit which equals to 1.25 units per hour with  Boluses as needed (up to 12 units at meals)     NOVOLOG 100 UNIT/ML injection  Generic drug:  insulin aspart     levothyroxine 50 MCG tablet  Commonly known as:  SYNTHROID, LEVOTHROID  Take 50 mcg by mouth daily before breakfast.     losartan 100 MG tablet  Commonly known as:  COZAAR  Take 50 mg by mouth every morning.     metFORMIN 1000 MG tablet  Commonly known as:  GLUCOPHAGE  Take 1,000 mg by mouth 2 (two) times daily with a meal.     metoprolol succinate 50 MG 24 hr tablet  Commonly known as:  TOPROL-XL     nitroGLYCERIN 0.4 MG SL tablet  Commonly known as:  NITROSTAT  Place 0.4 mg under the tongue every 5 (five) minutes as needed.     pioglitazone 15 MG tablet  Commonly known as:  ACTOS  Take 15 mg by mouth every  morning.     pregabalin 100 MG capsule  Commonly known as:  LYRICA  Take 100 mg by mouth 3 (three) times daily.     LYRICA 100 MG capsule  Generic drug:  pregabalin     rosuvastatin 10 MG tablet  Commonly known as:  CRESTOR  Take 5 mg by mouth at bedtime. Pt takes 1/2 tablet (70m total)     CRESTOR 10 MG tablet  Generic drug:  rosuvastatin     testosterone 50 MG/5GM (1%) Gel  Commonly known as:  ANDROGEL  Place 5 g onto the skin daily. Reported on 04/27/2015     V-GO 30 Kit  Reported on 06/29/2015     VICTOZA 18 MG/3ML Sopn  Generic drug:  Liraglutide  Inject 1.8 mg into the skin every morning.     VICTOZA 18 MG/3ML Sopn  Generic drug:  Liraglutide        Allergies: No Known Allergies  Family History: Family History  Problem Relation Age of Onset  . Lung cancer Mother   . Heart attack Father   . Post-traumatic stress disorder Father   . Anxiety disorder Father   . Depression Father   . Heart attack Sister   . Heart attack Brother     Social History:  reports that he has never smoked. He has never used smokeless tobacco. He reports that he does not drink alcohol or use illicit drugs.   Review of Systems  Gastrointestinal (upper)  : Negative for upper GI symptoms  Gastrointestinal (lower) : Negative for lower GI symptoms  Constitutional : Fatigue  Skin: Negative for skin symptoms  Eyes: Negative for eye symptoms  Ear/Nose/Throat : Negative for Ear/Nose/Throat symptoms  Hematologic/Lymphatic: Negative for Hematologic/Lymphatic symptoms  Cardiovascular : Negative for cardiovascular symptoms  Respiratory : Negative for respiratory symptoms  Endocrine: Negative for endocrine symptoms  Musculoskeletal: Negative for musculoskeletal symptoms  Neurological: Negative for neurological symptoms  Psychologic: Negative for psychiatric symptoms Physical Exam: BP 162/74 mmHg  Pulse 76  Ht 6' 2"  (1.88 m)  Wt 288 lb 14.4 oz (131.044 kg)  BMI  37.08 kg/m2  Constitutional:  Alert and oriented,  No acute distress. HEENT: [place] AT, moist mucus membranes.  Trachea midline, no masses. Cardiovascular: No clubbing, cyanosis, or edema. Respiratory: Normal respiratory effort, no increased work of breathing. GI: Abdomen is soft, nontender, nondistended, no abdominal masses. Significant truncal obesity.  GU: No CVA tenderness.  Skin: No rashes, bruises or suspicious lesions. Lymph: No cervical or inguinal adenopathy. Neurologic: Grossly intact, no focal deficits, moving all 4 extremities. Psychiatric: Normal mood and affect.  Laboratory Data: Lab Results  Component Value Date   WBC 9.9 02/19/2015   HGB 11.7* 02/19/2015   HCT 34.3* 02/19/2015   MCV 94.0 02/19/2015   PLT 253 02/19/2015    Lab Results  Component Value Date   CREATININE 1.23 02/19/2015    No results found for: PSA  Lab Results  Component Value Date   TESTOSTERONE 220* 07/02/2015    Lab Results  Component Value Date   HGBA1C 6.4* 11/14/2014    Urinalysis    Component Value Date/Time   COLORURINE YELLOW* 11/14/2014 1415   COLORURINE Yellow 01/26/2014 0759   APPEARANCEUR CLEAR* 11/14/2014 1415   APPEARANCEUR Clear 01/26/2014 0759   LABSPEC 1.013 11/14/2014 1415   LABSPEC 1.021 01/26/2014 0759   PHURINE 5.0 11/14/2014 1415   PHURINE 5.0 01/26/2014 0759   GLUCOSEU NEGATIVE 11/14/2014 1415   GLUCOSEU >=500 01/26/2014 0759   HGBUR NEGATIVE 11/14/2014 1415   HGBUR Negative 01/26/2014 0759   BILIRUBINUR NEGATIVE 11/14/2014 1415   BILIRUBINUR Negative 01/26/2014 0759   KETONESUR NEGATIVE 11/14/2014 1415   KETONESUR Negative 01/26/2014 0759   PROTEINUR NEGATIVE 11/14/2014 1415   PROTEINUR Negative 01/26/2014 0759   NITRITE NEGATIVE 11/14/2014 1415   NITRITE Negative 01/26/2014 0759   LEUKOCYTESUR NEGATIVE 11/14/2014 1415   LEUKOCYTESUR Negative 01/26/2014 0759      Assessment & Plan:    1 - Elevated PSA - now s/p negative BX. This is good news.  Discussed single BX able ton encompass about 70% lifetime risk, would consider repeat only for future significant and persistent PSA elevations or change in exam.   2 - Hypogonadism - agree with non-replacement, especially in setting of significant CV comorbidity.   3 - Erectile Dysfunction - IPP remains functional, continue.   4 - RTC 1 year with PSA prior.    Alexis Frock, [facility] [address] [place], [place] [address] [phone]

## 2015-08-05 ENCOUNTER — Other Ambulatory Visit: Payer: Self-pay | Admitting: Urology

## 2015-09-23 ENCOUNTER — Observation Stay
Admission: EM | Admit: 2015-09-23 | Discharge: 2015-09-25 | Disposition: A | Discharge status: Home / Self Care | Payer: Medicare HMO | Attending: Internal Medicine | Admitting: Internal Medicine

## 2015-09-23 DIAGNOSIS — Z955 Presence of coronary angioplasty implant and graft: Secondary | ICD-10-CM | POA: Insufficient documentation

## 2015-09-23 DIAGNOSIS — I2510 Atherosclerotic heart disease of native coronary artery without angina pectoris: Secondary | ICD-10-CM | POA: Diagnosis not present

## 2015-09-23 DIAGNOSIS — I10 Essential (primary) hypertension: Secondary | ICD-10-CM | POA: Diagnosis not present

## 2015-09-23 DIAGNOSIS — Z7902 Long term (current) use of antithrombotics/antiplatelets: Secondary | ICD-10-CM | POA: Diagnosis not present

## 2015-09-23 DIAGNOSIS — Z791 Long term (current) use of non-steroidal anti-inflammatories (NSAID): Secondary | ICD-10-CM | POA: Insufficient documentation

## 2015-09-23 DIAGNOSIS — E039 Hypothyroidism, unspecified: Secondary | ICD-10-CM | POA: Diagnosis not present

## 2015-09-23 DIAGNOSIS — F419 Anxiety disorder, unspecified: Secondary | ICD-10-CM | POA: Diagnosis not present

## 2015-09-23 DIAGNOSIS — E785 Hyperlipidemia, unspecified: Secondary | ICD-10-CM | POA: Insufficient documentation

## 2015-09-23 DIAGNOSIS — E1140 Type 2 diabetes mellitus with diabetic neuropathy, unspecified: Secondary | ICD-10-CM | POA: Diagnosis not present

## 2015-09-23 DIAGNOSIS — Z6838 Body mass index (BMI) 38.0-38.9, adult: Secondary | ICD-10-CM | POA: Diagnosis not present

## 2015-09-23 DIAGNOSIS — F603 Borderline personality disorder: Secondary | ICD-10-CM | POA: Diagnosis not present

## 2015-09-23 DIAGNOSIS — F39 Unspecified mood [affective] disorder: Secondary | ICD-10-CM | POA: Diagnosis not present

## 2015-09-23 DIAGNOSIS — E875 Hyperkalemia: Secondary | ICD-10-CM | POA: Insufficient documentation

## 2015-09-23 DIAGNOSIS — Z79899 Other long term (current) drug therapy: Secondary | ICD-10-CM | POA: Insufficient documentation

## 2015-09-23 DIAGNOSIS — K219 Gastro-esophageal reflux disease without esophagitis: Secondary | ICD-10-CM | POA: Insufficient documentation

## 2015-09-23 DIAGNOSIS — N529 Male erectile dysfunction, unspecified: Secondary | ICD-10-CM | POA: Diagnosis not present

## 2015-09-23 DIAGNOSIS — M109 Gout, unspecified: Secondary | ICD-10-CM | POA: Insufficient documentation

## 2015-09-23 DIAGNOSIS — R0789 Other chest pain: Principal | ICD-10-CM | POA: Insufficient documentation

## 2015-09-23 DIAGNOSIS — G4733 Obstructive sleep apnea (adult) (pediatric): Secondary | ICD-10-CM | POA: Insufficient documentation

## 2015-09-23 DIAGNOSIS — Z794 Long term (current) use of insulin: Secondary | ICD-10-CM | POA: Insufficient documentation

## 2015-09-23 DIAGNOSIS — E669 Obesity, unspecified: Secondary | ICD-10-CM | POA: Insufficient documentation

## 2015-09-23 DIAGNOSIS — Z7982 Long term (current) use of aspirin: Secondary | ICD-10-CM | POA: Diagnosis not present

## 2015-09-23 DIAGNOSIS — I739 Peripheral vascular disease, unspecified: Secondary | ICD-10-CM | POA: Diagnosis not present

## 2015-09-23 DIAGNOSIS — R079 Chest pain, unspecified: Secondary | ICD-10-CM | POA: Diagnosis present

## 2015-09-23 DIAGNOSIS — N400 Enlarged prostate without lower urinary tract symptoms: Secondary | ICD-10-CM | POA: Diagnosis not present

## 2015-09-23 HISTORY — DX: Obstructive sleep apnea (adult) (pediatric): G47.33

## 2015-09-24 ENCOUNTER — Observation Stay: Admit: 2015-09-24 | Payer: Medicare HMO

## 2015-09-24 ENCOUNTER — Emergency Department: Payer: Medicare HMO

## 2015-09-24 DIAGNOSIS — R079 Chest pain, unspecified: Secondary | ICD-10-CM | POA: Diagnosis present

## 2015-09-24 LAB — BASIC METABOLIC PANEL
ANION GAP: 6 (ref 5–15)
Anion gap: 8 (ref 5–15)
BUN: 22 mg/dL — AB (ref 6–20)
BUN: 22 mg/dL — AB (ref 6–20)
CALCIUM: 8.6 mg/dL — AB (ref 8.9–10.3)
CHLORIDE: 108 mmol/L (ref 101–111)
CHLORIDE: 108 mmol/L (ref 101–111)
CO2: 22 mmol/L (ref 22–32)
CO2: 23 mmol/L (ref 22–32)
CREATININE: 1.02 mg/dL (ref 0.61–1.24)
Calcium: 8.5 mg/dL — ABNORMAL LOW (ref 8.9–10.3)
Creatinine, Ser: 1.14 mg/dL (ref 0.61–1.24)
GFR calc Af Amer: >60 mL/min (ref >60)
GFR calc Af Amer: >60 mL/min (ref >60)
GFR calc non Af Amer: >60 mL/min (ref >60)
GLUCOSE: 196 mg/dL — AB (ref 65–99)
Glucose, Bld: 165 mg/dL — ABNORMAL HIGH (ref 65–99)
POTASSIUM: 5.4 mmol/L — AB (ref 3.5–5.1)
Potassium: 5.3 mmol/L — ABNORMAL HIGH (ref 3.5–5.1)
SODIUM: 139 mmol/L (ref 135–145)
Sodium: 136 mmol/L (ref 135–145)

## 2015-09-24 LAB — LIPID PANEL
CHOL/HDL RATIO: 3.2 ratio
Cholesterol: 152 mg/dL (ref 0–200)
HDL: 48 mg/dL (ref >40)
LDL Cholesterol: 46 mg/dL (ref 0–99)
Triglycerides: 291 mg/dL — ABNORMAL HIGH (ref <150)
VLDL: 58 mg/dL — ABNORMAL HIGH (ref 0–40)

## 2015-09-24 LAB — TROPONIN I
Troponin I: <0.03 ng/mL (ref <0.031)
Troponin I: <0.03 ng/mL (ref <0.031)
Troponin I: <0.03 ng/mL (ref <0.031)

## 2015-09-24 LAB — GLUCOSE, CAPILLARY
Glucose-Capillary: 162 mg/dL — ABNORMAL HIGH (ref 65–99)
Glucose-Capillary: 202 mg/dL — ABNORMAL HIGH (ref 65–99)
Glucose-Capillary: 157 mg/dL — ABNORMAL HIGH (ref 65–99)
Glucose-Capillary: 180 mg/dL — ABNORMAL HIGH (ref 65–99)

## 2015-09-24 LAB — CBC
HEMATOCRIT: 30.9 % — AB (ref 40.0–52.0)
HEMOGLOBIN: 10 g/dL — AB (ref 13.0–18.0)
MCH: 30 pg (ref 26.0–34.0)
MCHC: 32.5 g/dL (ref 32.0–36.0)
MCV: 92.4 fL (ref 80.0–100.0)
Platelets: 190 10*3/uL (ref 150–440)
RBC: 3.35 MIL/uL — ABNORMAL LOW (ref 4.40–5.90)
RDW: 16.3 % — ABNORMAL HIGH (ref 11.5–14.5)
WBC: 6.7 10*3/uL (ref 3.8–10.6)

## 2015-09-24 LAB — HEMOGLOBIN A1C: Hgb A1c MFr Bld: 6.4 % — ABNORMAL HIGH (ref 4.0–6.0)

## 2015-09-24 MED ORDER — ALLOPURINOL 100 MG PO TABS
100.0000 mg | ORAL_TABLET | Freq: Every morning | ORAL | Status: DC
  Administered 2015-09-24 – 2015-09-25 (×2): 100 mg via ORAL
  Filled 2015-09-24 (×2): qty 1

## 2015-09-24 MED ORDER — LOSARTAN POTASSIUM 50 MG PO TABS
100.0000 mg | ORAL_TABLET | Freq: Every day | ORAL | Status: DC
  Administered 2015-09-24: 100 mg via ORAL
  Filled 2015-09-24: qty 2

## 2015-09-24 MED ORDER — ACETAMINOPHEN 325 MG PO TABS: 650.0000 mg | ORAL_TABLET | ORAL | Status: DC | PRN

## 2015-09-24 MED ORDER — INSULIN ASPART 100 UNIT/ML ~~LOC~~ SOLN
0.0000 [IU] | Freq: Three times a day (TID) | SUBCUTANEOUS | Status: DC
  Administered 2015-09-24: 3 [IU] via SUBCUTANEOUS
  Administered 2015-09-24: 2 [IU] via SUBCUTANEOUS
  Administered 2015-09-25: 3 [IU] via SUBCUTANEOUS
  Filled 2015-09-24 (×2): qty 3
  Filled 2015-09-24: qty 2

## 2015-09-24 MED ORDER — HYDROCHLOROTHIAZIDE 25 MG PO TABS
25.0000 mg | ORAL_TABLET | Freq: Every day | ORAL | Status: DC
  Administered 2015-09-24 – 2015-09-25 (×2): 25 mg via ORAL
  Filled 2015-09-24 (×2): qty 1

## 2015-09-24 MED ORDER — PANTOPRAZOLE SODIUM 40 MG PO TBEC
40.0000 mg | DELAYED_RELEASE_TABLET | Freq: Every day | ORAL | Status: DC
  Administered 2015-09-24 – 2015-09-25 (×2): 40 mg via ORAL
  Filled 2015-09-24 (×2): qty 1

## 2015-09-24 MED ORDER — DIVALPROEX SODIUM 500 MG PO DR TAB
500.0000 mg | DELAYED_RELEASE_TABLET | Freq: Every morning | ORAL | Status: DC
  Administered 2015-09-24 – 2015-09-25 (×2): 500 mg via ORAL
  Filled 2015-09-24 (×2): qty 1

## 2015-09-24 MED ORDER — SODIUM CHLORIDE 0.9% FLUSH
3.0000 mL | Freq: Two times a day (BID) | INTRAVENOUS | Status: DC
  Administered 2015-09-24 (×3): 3 mL via INTRAVENOUS

## 2015-09-24 MED ORDER — COLCHICINE 0.6 MG PO TABS: 0.6000 mg | ORAL_TABLET | Freq: Every day | ORAL | Status: DC | PRN

## 2015-09-24 MED ORDER — PREGABALIN 50 MG PO CAPS
100.0000 mg | ORAL_CAPSULE | Freq: Three times a day (TID) | ORAL | Status: DC
  Administered 2015-09-24 – 2015-09-25 (×4): 100 mg via ORAL
  Filled 2015-09-24 (×4): qty 2

## 2015-09-24 MED ORDER — MORPHINE SULFATE (PF) 2 MG/ML IV SOLN: 2.0000 mg | INTRAVENOUS | Status: DC | PRN

## 2015-09-24 MED ORDER — ONDANSETRON HCL 4 MG/2ML IJ SOLN: 4.0000 mg | Freq: Four times a day (QID) | INTRAMUSCULAR | Status: DC | PRN

## 2015-09-24 MED ORDER — DIVALPROEX SODIUM ER 250 MG PO TB24: 500.0000 mg | ORAL_TABLET | Freq: Three times a day (TID) | ORAL | Status: DC

## 2015-09-24 MED ORDER — NITROGLYCERIN 0.4 MG SL SUBL
0.4000 mg | SUBLINGUAL_TABLET | SUBLINGUAL | Status: DC | PRN
Start: 2015-09-24 — End: 2015-09-24

## 2015-09-24 MED ORDER — ASPIRIN EC 81 MG PO TBEC: 81.0000 mg | DELAYED_RELEASE_TABLET | Freq: Every day | ORAL | Status: DC

## 2015-09-24 MED ORDER — ROSUVASTATIN CALCIUM 10 MG PO TABS
5.0000 mg | ORAL_TABLET | Freq: Every day | ORAL | Status: DC
  Administered 2015-09-24: 5 mg via ORAL
  Filled 2015-09-24: qty 1

## 2015-09-24 MED ORDER — SODIUM POLYSTYRENE SULFONATE 15 GM/60ML PO SUSP
15.0000 g | Freq: Once | ORAL | Status: AC
  Administered 2015-09-24: 15 g via ORAL
  Filled 2015-09-24: qty 60

## 2015-09-24 MED ORDER — LIRAGLUTIDE 18 MG/3ML ~~LOC~~ SOPN: 1.8000 mg | PEN_INJECTOR | Freq: Every day | SUBCUTANEOUS | Status: DC

## 2015-09-24 MED ORDER — ASPIRIN 81 MG PO CHEW: 324.0000 mg | CHEWABLE_TABLET | ORAL | Status: AC

## 2015-09-24 MED ORDER — LEVOTHYROXINE SODIUM 50 MCG PO TABS
50.0000 ug | ORAL_TABLET | Freq: Every day | ORAL | Status: DC
  Administered 2015-09-24 – 2015-09-25 (×2): 50 ug via ORAL
  Filled 2015-09-24 (×2): qty 1

## 2015-09-24 MED ORDER — AMLODIPINE BESYLATE 5 MG PO TABS
2.5000 mg | ORAL_TABLET | Freq: Every day | ORAL | Status: DC
  Administered 2015-09-24 – 2015-09-25 (×2): 2.5 mg via ORAL
  Filled 2015-09-24 (×2): qty 1

## 2015-09-24 MED ORDER — LOSARTAN POTASSIUM-HCTZ 100-25 MG PO TABS: 1.0000 | ORAL_TABLET | Freq: Every day | ORAL | Status: DC

## 2015-09-24 MED ORDER — ENOXAPARIN SODIUM 40 MG/0.4ML ~~LOC~~ SOLN
40.0000 mg | Freq: Two times a day (BID) | SUBCUTANEOUS | Status: DC
  Administered 2015-09-24 – 2015-09-25 (×3): 40 mg via SUBCUTANEOUS
  Filled 2015-09-24 (×3): qty 0.4

## 2015-09-24 MED ORDER — GEMFIBROZIL 600 MG PO TABS
600.0000 mg | ORAL_TABLET | Freq: Two times a day (BID) | ORAL | Status: DC
  Administered 2015-09-24 – 2015-09-25 (×3): 600 mg via ORAL
  Filled 2015-09-24 (×3): qty 1

## 2015-09-24 MED ORDER — ASPIRIN 81 MG PO CHEW
CHEWABLE_TABLET | ORAL | Status: AC
  Administered 2015-09-24: 164 mg
  Filled 2015-09-24: qty 4

## 2015-09-24 MED ORDER — SODIUM CHLORIDE 0.9 % IV SOLN: 250.0000 mL | INTRAVENOUS | Status: DC | PRN

## 2015-09-24 MED ORDER — DIVALPROEX SODIUM 500 MG PO DR TAB
1000.0000 mg | DELAYED_RELEASE_TABLET | Freq: Every day | ORAL | Status: DC
  Administered 2015-09-24: 1000 mg via ORAL
  Filled 2015-09-24: qty 2

## 2015-09-24 MED ORDER — ASPIRIN 300 MG RE SUPP: 300.0000 mg | RECTAL | Status: AC

## 2015-09-24 MED ORDER — DULOXETINE HCL 30 MG PO CPEP
60.0000 mg | ORAL_CAPSULE | Freq: Every day | ORAL | Status: DC
  Administered 2015-09-24 – 2015-09-25 (×2): 60 mg via ORAL
  Filled 2015-09-24 (×2): qty 2

## 2015-09-24 MED ORDER — ZOLPIDEM TARTRATE 5 MG PO TABS
10.0000 mg | ORAL_TABLET | Freq: Every day | ORAL | Status: DC
  Filled 2015-09-24: qty 2

## 2015-09-24 MED ORDER — ASPIRIN EC 81 MG PO TBEC
81.0000 mg | DELAYED_RELEASE_TABLET | Freq: Every morning | ORAL | Status: DC
  Administered 2015-09-24 – 2015-09-25 (×2): 81 mg via ORAL
  Filled 2015-09-24 (×2): qty 1

## 2015-09-24 MED ORDER — NITROGLYCERIN 0.4 MG SL SUBL: 0.4000 mg | SUBLINGUAL_TABLET | SUBLINGUAL | Status: DC | PRN

## 2015-09-24 MED ORDER — PIOGLITAZONE HCL 15 MG PO TABS
15.0000 mg | ORAL_TABLET | Freq: Every morning | ORAL | Status: DC
  Administered 2015-09-24 – 2015-09-25 (×2): 15 mg via ORAL
  Filled 2015-09-24 (×2): qty 1

## 2015-09-24 MED ORDER — CLOPIDOGREL BISULFATE 75 MG PO TABS
75.0000 mg | ORAL_TABLET | Freq: Every morning | ORAL | Status: DC
  Administered 2015-09-24 – 2015-09-25 (×2): 75 mg via ORAL
  Filled 2015-09-24 (×2): qty 1

## 2015-09-24 MED ORDER — ROPINIROLE HCL 1 MG PO TABS
1.0000 mg | ORAL_TABLET | Freq: Every day | ORAL | Status: DC
  Administered 2015-09-24: 1 mg via ORAL
  Filled 2015-09-24: qty 1

## 2015-09-24 MED ORDER — INSULIN ASPART 100 UNIT/ML ~~LOC~~ SOLN: 0.0000 [IU] | Freq: Every day | SUBCUTANEOUS | Status: DC

## 2015-09-24 MED ORDER — METOPROLOL SUCCINATE ER 50 MG PO TB24
50.0000 mg | ORAL_TABLET | Freq: Every day | ORAL | Status: DC
  Administered 2015-09-24 – 2015-09-25 (×2): 50 mg via ORAL
  Filled 2015-09-24 (×2): qty 1

## 2015-09-24 MED ORDER — OXYCODONE HCL 5 MG PO TABS: 5.0000 mg | ORAL_TABLET | ORAL | Status: DC | PRN

## 2015-09-24 MED ORDER — SODIUM CHLORIDE 0.9% FLUSH
3.0000 mL | INTRAVENOUS | Status: DC | PRN
  Administered 2015-09-25: 3 mL via INTRAVENOUS
  Filled 2015-09-24: qty 3

## 2015-09-24 NOTE — H&P (Signed)
[facility] NAME: Dennis Mullen    MR#:  [id number]  DATE OF BIRTH:  [birth year]  DATE OF ADMISSION:  09/23/2015  PRIMARY CARE PHYSICIAN: Lavera Guise, MD   REQUESTING/REFERRING PHYSICIAN:   CHIEF COMPLAINT:   Chief Complaint  Patient presents with  . Chest Pain    HISTORY OF PRESENT ILLNESS: Dennis Mullen  is a 65 y.o. male with a known history of Type 2 diabetes mellitus, obesity, hypothyroidism, bipolar disorder, neuropathy, hypertension, coronary artery disease with cardiac stent presented to the emergency room with chest pain. Pain is located in the middle of the chest was sharp in nature. The chest pain was 8 out of 10 scale of 1-10 and nonradiating. The pain started yesterday. Patient also had a an episode of shortness of breath when he experienced the chest pain. Patient had 5 cardiac stents placed. In the last stent was put he was told he had 60% occlusion. No history of any orthopnea or proximal nocturnal dyspnea. No history of headache dizziness blurry vision. No history of syncope or seizure. First set of troponin was negative, EKG normal sinus rhythm no ST segment elevation or depression.  PAST MEDICAL HISTORY:   Past Medical History  Diagnosis Date  . Obesity   . Diabetes mellitus, type II ([place])   . Thyroid disease   . Anxiety   . Bipolar disorder ([place])   . History of borderline personality disorder   . Depression   . Neuropathy ([place])   . Diabetes mellitus, type II, insulin dependent ([place])   . Hypertension   . CHF (congestive heart failure) ([place])     ?  Marland Kitchen CAD (coronary artery disease)   . Gout   . Mood disorder ([place])   . Insomnia   . PVD (peripheral vascular disease) ([place])   . Abnormal levels of other serum enzymes   . GERD (gastroesophageal reflux disease)   . BPH (benign prostatic hyperplasia)   . ED (erectile dysfunction)   . Hypogonadism in male     PAST SURGICAL HISTORY: Past Surgical History   Procedure Laterality Date  . Tonsillectomy and adenoidectomy    . Nasal septum surgery    . Mastoidectomy Right   . Penial inplant    . Coronary angioplasty with stent placement      x5 stents  . Right mastoidectomy    . Penile prosthesis implant    . Foot surgery    . Scrotoplasty      SOCIAL HISTORY:  Social History  Substance Use Topics  . Smoking status: Never Smoker   . Smokeless tobacco: Never Used  . Alcohol Use: No    FAMILY HISTORY:  Family History  Problem Relation Age of Onset  . Lung cancer Mother   . Heart attack Father   . Post-traumatic stress disorder Father   . Anxiety disorder Father   . Depression Father   . Heart attack Sister   . Heart attack Brother     DRUG ALLERGIES: No Known Allergies  REVIEW OF SYSTEMS:   CONSTITUTIONAL: No fever, fatigue or weakness.  EYES: No blurred or double vision.  EARS, NOSE, AND THROAT: No tinnitus or ear pain.  RESPIRATORY: No cough, shortness of breath, wheezing or hemoptysis.  CARDIOVASCULAR: Has chest pain, no orthopnea, edema.  GASTROINTESTINAL: No nausea, vomiting, diarrhea or abdominal pain.  GENITOURINARY: No dysuria, hematuria.  ENDOCRINE: No polyuria, nocturia,  HEMATOLOGY: No anemia, easy bruising or bleeding SKIN:  No rash or lesion. MUSCULOSKELETAL: No joint pain or arthritis.   NEUROLOGIC: No tingling, numbness, weakness.  PSYCHIATRY: No anxiety or depression.   MEDICATIONS AT HOME:  Prior to Admission medications   Medication Sig Start Date End Date Taking? Authorizing Provider  ACCU-CHEK SMARTVIEW test strip  10/15/14  Yes Historical Provider, MD  allopurinol (ZYLOPRIM) 100 MG tablet Take 100 mg by mouth every morning.   Yes Historical Provider, MD  amLODipine (NORVASC) 5 MG tablet Take 2.5 mg by mouth daily.  10/14/14  Yes Historical Provider, MD  aspirin EC 81 MG tablet Take 81 mg by mouth every morning. Reported on 07/26/2015   Yes Historical Provider, MD  clopidogrel (PLAVIX) 75 MG tablet  Take 75 mg by mouth every morning. Reported on 07/26/2015   Yes Historical Provider, MD  colchicine 0.6 MG tablet Take 0.6 mg by mouth 2 (two) times daily as needed.   Yes Historical Provider, MD  diclofenac (VOLTAREN) 75 MG EC tablet Take 75 mg by mouth 2 (two) times daily as needed.   Yes Historical Provider, MD  divalproex (DEPAKOTE ER) 500 MG 24 hr tablet Take 1 tablet (500 mg total) by mouth 3 (three) times daily between meals. 04/27/15  Yes Gonzella Lex, MD  DULoxetine (CYMBALTA) 60 MG capsule Take 1 capsule (60 mg total) by mouth daily. 04/27/15  Yes Gonzella Lex, MD  esomeprazole (NEXIUM) 40 MG capsule Take 40 mg by mouth daily at 12 noon.  10/14/14  Yes Historical Provider, MD  furosemide (LASIX) 20 MG tablet Take 40 mg by mouth daily.  07/29/15  Yes Historical Provider, MD  gemfibrozil (LOPID) 600 MG tablet Take 600 mg by mouth 2 (two) times daily before a meal.   Yes Historical Provider, MD  insulin aspart (NOVOLOG) 100 UNIT/ML injection Inject into the skin. Take up to 66 units dialy as directed via VGO kit which equals to 1.25 units per hour with  Boluses as needed (up to 12 units at meals)   Yes Historical Provider, MD  Insulin Disposable Pump (V-GO 30) KIT Reported on 06/29/2015 10/07/14  Yes Historical Provider, MD  Insulin Pen Needle (NOVOFINE) 32G X 6 MM MISC Reported on 07/26/2015 11/07/13  Yes Historical Provider, MD  levothyroxine (SYNTHROID, LEVOTHROID) 50 MCG tablet Take 50 mcg by mouth daily before breakfast.   Yes Historical Provider, MD  losartan-hydrochlorothiazide (HYZAAR) 100-25 MG tablet  05/11/15  Yes Historical Provider, MD  metFORMIN (GLUCOPHAGE) 1000 MG tablet Take 1,000 mg by mouth 2 (two) times daily with a meal.   Yes Historical Provider, MD  metoprolol succinate (TOPROL-XL) 50 MG 24 hr tablet Take 50 mg by mouth daily.  09/11/14  Yes Historical Provider, MD  nitroGLYCERIN (NITROSTAT) 0.4 MG SL tablet Place 0.4 mg under the tongue every 5 (five) minutes as needed.   Yes  Historical Provider, MD  pioglitazone (ACTOS) 15 MG tablet Take 15 mg by mouth every morning.    Yes Historical Provider, MD  pregabalin (LYRICA) 100 MG capsule Take 100 mg by mouth 3 (three) times daily.   Yes Historical Provider, MD  rOPINIRole (REQUIP) 1 MG tablet Take 1 mg by mouth at bedtime.  04/27/15  Yes Historical Provider, MD  rosuvastatin (CRESTOR) 5 MG tablet Take 5 mg by mouth daily at 6 PM.  07/05/15  Yes Historical Provider, MD  testosterone (ANDROGEL) 50 MG/5GM (1%) GEL Place 5 g onto the skin daily. Reported on 08/03/2015   Yes Historical Provider, MD  VICTOZA 18 MG/3ML SOPN  Inject 1.8 mg into the skin daily.  09/07/14  Yes Historical Provider, MD  zolpidem (AMBIEN) 10 MG tablet Take 10 mg by mouth at bedtime. Reported on 07/26/2015 05/20/15  Yes Historical Provider, MD      PHYSICAL EXAMINATION:   VITAL SIGNS: Blood pressure 160/52, pulse 72, temperature 98.4 F (36.9 C), temperature source Oral, resp. rate 19, height 6' 2"  (1.88 m), weight 136.079 kg (300 lb), SpO2 94 %.  GENERAL:  65 y.o.-year-old patient lying in the bed with no acute distress.  EYES: Pupils equal, round, reactive to light and accommodation. No scleral icterus. Extraocular muscles intact.  HEENT: Head atraumatic, normocephalic. Oropharynx and nasopharynx clear.  NECK:  Supple, no jugular venous distention. No thyroid enlargement, no tenderness.  LUNGS: Normal breath sounds bilaterally, no wheezing, rales,rhonchi or crepitation. No use of accessory muscles of respiration.  CARDIOVASCULAR: S1, S2 normal. No murmurs, rubs, or gallops.  ABDOMEN: Soft, nontender, nondistended. Bowel sounds present. No organomegaly or mass.  EXTREMITIES: No pedal edema, cyanosis, or clubbing.  NEUROLOGIC: Cranial nerves II through XII are intact. Muscle strength 5/5 in all extremities. Sensation intact. Gait not checked.  PSYCHIATRIC: The patient is alert and oriented x 3.  SKIN: No obvious rash, lesion, or ulcer.   LABORATORY  PANEL:   CBC  Recent Labs Lab 09/24/15 0003  WBC 6.7  HGB 10.0*  HCT 30.9*  PLT 190  MCV 92.4  MCH 30.0  MCHC 32.5  RDW 16.3*   ------------------------------------------------------------------------------------------------------------------  Chemistries   Recent Labs Lab 09/24/15 0003  NA 136  K 5.4*  CL 108  CO2 22  GLUCOSE 196*  BUN 22*  CREATININE 1.14  CALCIUM 8.5*   ------------------------------------------------------------------------------------------------------------------ estimated creatinine clearance is 98.6 mL/min (by C-G formula based on Cr of 1.14). ------------------------------------------------------------------------------------------------------------------ No results for input(s): TSH, T4TOTAL, T3FREE, THYROIDAB in the last 72 hours.  Invalid input(s): FREET3   Coagulation profile No results for input(s): INR, PROTIME in the last 168 hours. ------------------------------------------------------------------------------------------------------------------- No results for input(s): DDIMER in the last 72 hours. -------------------------------------------------------------------------------------------------------------------  Cardiac Enzymes  Recent Labs Lab 09/24/15 0003  TROPONINI <0.03   ------------------------------------------------------------------------------------------------------------------ Invalid input(s): POCBNP  ---------------------------------------------------------------------------------------------------------------  Urinalysis    Component Value Date/Time   COLORURINE YELLOW* 11/14/2014 1415   COLORURINE Yellow 01/26/2014 0759   APPEARANCEUR CLEAR* 11/14/2014 1415   APPEARANCEUR Clear 01/26/2014 0759   LABSPEC 1.013 11/14/2014 1415   LABSPEC 1.021 01/26/2014 0759   PHURINE 5.0 11/14/2014 1415   PHURINE 5.0 01/26/2014 0759   GLUCOSEU NEGATIVE 11/14/2014 1415   GLUCOSEU >=500 01/26/2014 0759   HGBUR  NEGATIVE 11/14/2014 1415   HGBUR Negative 01/26/2014 0759   BILIRUBINUR NEGATIVE 11/14/2014 1415   BILIRUBINUR Negative 01/26/2014 0759   KETONESUR NEGATIVE 11/14/2014 1415   KETONESUR Negative 01/26/2014 0759   PROTEINUR NEGATIVE 11/14/2014 1415   PROTEINUR Negative 01/26/2014 0759   NITRITE NEGATIVE 11/14/2014 1415   NITRITE Negative 01/26/2014 0759   LEUKOCYTESUR NEGATIVE 11/14/2014 1415   LEUKOCYTESUR Negative 01/26/2014 0759     RADIOLOGY: Dg Chest Port 1 View  09/24/2015  CLINICAL DATA:  65 year old male with chest pain EXAM: PORTABLE CHEST 1 VIEW COMPARISON:  Chest radiograph dated 02/19/2015 FINDINGS: Single portable view of the chest demonstrate clear lungs. There is no pleural effusion or pneumothorax. Stable cardiac silhouette. There is degenerative changes of the spine and AC joints. No acute osseous pathology. IMPRESSION: No active disease. Electronically Signed   By: Anner Crete M.D.   On: 09/24/2015 00:22    EKG: Orders  placed or performed during the hospital encounter of 09/23/15  . ED EKG  . ED EKG  . EKG 12-Lead  . EKG 12-Lead    IMPRESSION AND PLAN: 65 year old male patient with history of coronary artery disease, cardiac stent, hypertension, type 2 diabetes mellitus presented to the emergency room with chest pain. Admitting diagnosis 1. Chest pain rule out myocardial infarction 2. Coronary artery disease with history of cardiac stent 3. Hypertension 4. Type 2 diabetes mellitus 5. Mild hyperkalemia  Treatment plan : Admit patient to telemetry observation bed Check troponin to rule out ischemia Check echocardiogram Cardiology consultation for chest pain DVT prophylaxis with subcutaneous Lovenox 40 MG daily Continue cardiac medications  All the records are reviewed and case discussed with ED provider. Management plans discussed with the patient, family and they are in agreement.  CODE STATUS:FULL Code Status History    Date Active Date Inactive  Code Status Order ID Comments User Context   11/14/2014  5:07 AM 11/15/2014  4:53 PM Full Code [id number]  Harrie Foreman, MD ED       TOTAL TIME TAKING CARE OF THIS PATIENT: 50 minutes.    Saundra Shelling M.D on 09/24/2015 at 1:46 AM  Between 7am to 6pm - Pager - [phone]  After 6pm go to [email or web address] - password [email or web address] [facility]  Office  [phone]  CC: Primary care physician; Lavera Guise, MD

## 2015-09-24 NOTE — ED Notes (Signed)
Admitting MD in to evaluate patient.

## 2015-09-24 NOTE — Progress Notes (Signed)
Pt had a 6 run of V-Tach, asymptomatic, no c/o chest pain, MD Pyreddy notified. No new orders received.

## 2015-09-24 NOTE — Care Management Obs Status (Signed)
[facility] NOTIFICATION   Patient Details  Name: Dennis Mullen MRN: [id number] Date of Birth: [birth year]   Medicare Observation Status Notification Given:  Yes    Jolly Mango, RN 09/24/2015, 3:01 PM

## 2015-09-24 NOTE — ED Notes (Signed)
Patient was shopping and had sudden onset of crushing chest pain that was accompanied with shortness of breath that lasted for about 5-10 mins. Patient is currently speaking in full sentences without difficulty. MD in room for evaluation.

## 2015-09-24 NOTE — ED Provider Notes (Signed)
[facility] Emergency Department Provider Note  ____________________________________________  Time seen: 12:15 AM  I have reviewed the triage vital signs and the nursing notes.   HISTORY  Chief Complaint Chest Pain    HPI Dennis Mullen is a 65 y.o. male with history of coronary artery disease with 5 cardiac stents as well as peripheral artery disease hypertension and diabetes presents with acute onset of chest pain lasting approximately 10 minutes while shopping at [facility] accompanied by shortness of breath. Patient states that his last catheterization which was done approximately an half ago revealed a 60% area of stenosis. Patient states that chest pain was 10 out of 10 but now completely resolved.     Past Medical History  Diagnosis Date  . Obesity   . Diabetes mellitus, type II ([place])   . Thyroid disease   . Anxiety   . Bipolar disorder ([place])   . History of borderline personality disorder   . Depression   . Neuropathy ([place])   . Diabetes mellitus, type II, insulin dependent ([place])   . Hypertension   . CHF (congestive heart failure) ([place])     ?  Marland Kitchen CAD (coronary artery disease)   . Gout   . Mood disorder ([place])   . Insomnia   . PVD (peripheral vascular disease) ([place])   . Abnormal levels of other serum enzymes   . GERD (gastroesophageal reflux disease)   . BPH (benign prostatic hyperplasia)   . ED (erectile dysfunction)   . Hypogonadism in male     Patient Active Problem List   Diagnosis Date Noted  . Elevated PSA 07/26/2015  . Hypogonadism in male 07/26/2015  . 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 BPH with obstruction/lower urinary tract symptoms 06/29/2015  . Bipolar 1 disorder, mixed ([place]) 04/14/2015  . Borderline personality disorder 04/14/2015  . Acute renal failure ([place]) 11/15/2014  . Chest pain 11/14/2014  . Dizziness 11/05/2014  . Benign essential HTN 11/04/2014  . Combined fat and carbohydrate induced  hyperlipemia 11/04/2014  . Bipolar 2 disorder ([place]) 10/27/2014  . Acquired hypothyroidism 02/03/2014  . Type 2 diabetes mellitus ([place]) 02/03/2014  . Adiposity 01/06/2014  . Obesity, diabetes, and hypertension syndrome ([place]) 12/23/2013  . Long term current use of insulin ([place]) 12/23/2013  . Type 2 diabetes mellitus with other diabetic neurological complication ([place]) 09/81/1914  . Acute inflammation of the pancreas 11/18/2013  . Elevated cholesterol with elevated triglycerides 11/18/2013  . CAD in native artery 09/26/2013  . Diabetes mellitus ([place]) 09/26/2013  . Acute non-ST elevation myocardial infarction (NSTEMI) ([place]) 09/26/2013  . Non-ST elevation (NSTEMI) myocardial infarction [facility]) 09/26/2013    Past Surgical History  Procedure Laterality Date  . Tonsillectomy and adenoidectomy    . Nasal septum surgery    . Mastoidectomy Right   . Penial inplant    . Coronary angioplasty with stent placement      x5 stents  . Right mastoidectomy    . Penile prosthesis implant    . Foot surgery    . Scrotoplasty      Current Outpatient Rx  Name  Route  Sig  Dispense  Refill  . ACCU-CHEK SMARTVIEW test strip                 Dispense as written.   Marland Kitchen allopurinol (ZYLOPRIM) 100 MG tablet   Oral   Take 100 mg by mouth every morning.         Marland Kitchen amLODipine (NORVASC) 5 MG  tablet               . aspirin EC 81 MG tablet   Oral   Take 81 mg by mouth every morning. Reported on 07/26/2015         . clopidogrel (PLAVIX) 75 MG tablet   Oral   Take 75 mg by mouth every morning. Reported on 07/26/2015         . colchicine 0.6 MG tablet   Oral   Take 0.6 mg by mouth 2 (two) times daily as needed.         . CRESTOR 10 MG tablet                 Dispense as written.   . diclofenac (VOLTAREN) 75 MG EC tablet   Oral   Take 75 mg by mouth 2 (two) times daily as needed.         . divalproex (DEPAKOTE ER) 500 MG 24 hr tablet   Oral   Take 1 tablet (500 mg total) by mouth 3  (three) times daily between meals.   90 tablet   5   . DULoxetine (CYMBALTA) 60 MG capsule   Oral   Take 1 capsule (60 mg total) by mouth daily.   30 capsule   5   . esomeprazole (NEXIUM) 40 MG capsule               . furosemide (LASIX) 20 MG tablet               . gemfibrozil (LOPID) 600 MG tablet   Oral   Take 600 mg by mouth 2 (two) times daily before a meal.         . insulin aspart (NOVOLOG) 100 UNIT/ML injection   Subcutaneous   Inject into the skin. Take up to 66 units dialy as directed via VGO kit which equals to 1.25 units per hour with  Boluses as needed (up to 12 units at meals)         . Insulin Disposable Pump (V-GO 30) KIT      Reported on 06/29/2015         . Insulin Pen Needle (NOVOFINE) 32G X 6 MM MISC      Reported on 07/26/2015         . levothyroxine (SYNTHROID, LEVOTHROID) 50 MCG tablet   Oral   Take 50 mcg by mouth daily before breakfast.         . Liraglutide (VICTOZA) 18 MG/3ML SOPN   Subcutaneous   Inject 1.8 mg into the skin every morning.          . Liraglutide (VICTOZA) 18 MG/3ML SOPN   Subcutaneous   Inject into the skin. Reported on 07/26/2015         . losartan (COZAAR) 100 MG tablet   Oral   Take 50 mg by mouth every morning.          Marland Kitchen losartan-hydrochlorothiazide (HYZAAR) 100-25 MG tablet               . LYRICA 100 MG capsule                 Dispense as written.   . metFORMIN (GLUCOPHAGE) 1000 MG tablet   Oral   Take 1,000 mg by mouth 2 (two) times daily with a meal.         . metoprolol succinate (TOPROL-XL) 50 MG 24 hr tablet               .  nitroGLYCERIN (NITROSTAT) 0.4 MG SL tablet   Sublingual   Place 0.4 mg under the tongue every 5 (five) minutes as needed.         Marland Kitchen NOVOLOG 100 UNIT/ML injection                 Dispense as written.   . pioglitazone (ACTOS) 15 MG tablet   Oral   Take 15 mg by mouth every morning.          . pregabalin (LYRICA) 100 MG capsule    Oral   Take 100 mg by mouth 3 (three) times daily.         Marland Kitchen rOPINIRole (REQUIP) 1 MG tablet               . rosuvastatin (CRESTOR) 10 MG tablet   Oral   Take 5 mg by mouth at bedtime. Reported on 07/26/2015         . rosuvastatin (CRESTOR) 5 MG tablet               . testosterone (ANDROGEL) 50 MG/5GM (1%) GEL   Transdermal   Place 5 g onto the skin daily. Reported on 08/03/2015         . VICTOZA 18 MG/3ML SOPN                 Dispense as written.   . zolpidem (AMBIEN) 10 MG tablet      Reported on 07/26/2015           Allergies No known drug allergies  Family History  Problem Relation Age of Onset  . Lung cancer Mother   . Heart attack Father   . Post-traumatic stress disorder Father   . Anxiety disorder Father   . Depression Father   . Heart attack Sister   . Heart attack Brother     Social History Social History  Substance Use Topics  . Smoking status: Never Smoker   . Smokeless tobacco: Never Used  . Alcohol Use: No    Review of Systems  Constitutional: Negative for fever. Eyes: Negative for visual changes. ENT: Negative for sore throat. Cardiovascular: Positive for chest pain. Respiratory: Negative for shortness of breath. Gastrointestinal: Negative for abdominal pain, vomiting and diarrhea. Genitourinary: Negative for dysuria. Musculoskeletal: Negative for back pain. Skin: Negative for rash. Neurological: Negative for headaches, focal weakness or numbness.   10-point ROS otherwise negative.  ____________________________________________   PHYSICAL EXAM:  VITAL SIGNS: ED Triage Vitals  Enc Vitals Group     BP 09/24/15 0008 153/73 mmHg     Pulse Rate 09/24/15 0008 68     Resp 09/24/15 0008 18     Temp 09/24/15 0008 98.4 F (36.9 C)     Temp Source 09/24/15 0008 Oral     SpO2 09/24/15 0008 100 %     Weight 09/24/15 0008 300 lb (136.079 kg)     Height 09/24/15 0008 _0  (1.88 m)     Head Cir --      Peak Flow --       Pain Score 09/24/15 0010 0     Pain Loc --      Pain Edu? --      Excl. in [place]? --      Constitutional: Alert and oriented. Well appearing and in no distress. Eyes: Conjunctivae are normal. PERRL. Normal extraocular movements. ENT   Head: Normocephalic and atraumatic.   Nose: No congestion/rhinnorhea.   Mouth/Throat: Mucous membranes are moist.  Neck: No stridor. Hematological/Lymphatic/Immunilogical: No cervical lymphadenopathy. Cardiovascular: Normal rate, regular rhythm. Normal and symmetric distal pulses are present in all extremities. Grade 2 systolic ejection murmur Respiratory: Normal respiratory effort without tachypnea nor retractions. Breath sounds are clear and equal bilaterally. No wheezes/rales/rhonchi. Gastrointestinal: Soft and nontender. No distention. There is no CVA tenderness. Genitourinary: deferred Musculoskeletal: Nontender with normal range of motion in all extremities. No joint effusions.  No lower extremity tenderness nor edema. Neurologic:  Normal speech and language. No gross focal neurologic deficits are appreciated. Speech is normal.  Skin:  Skin is warm, dry and intact. No rash noted. Psychiatric: Mood and affect are normal. Speech and behavior are normal. Patient exhibits appropriate insight and judgment.  ____________________________________________    LABS (pertinent positives/negatives)  Labs Reviewed  BASIC METABOLIC PANEL - Abnormal; Notable for the following:    Potassium 5.4 (*)    Glucose, Bld 196 (*)    BUN 22 (*)    Calcium 8.5 (*)    All other components within normal limits  CBC - Abnormal; Notable for the following:    RBC 3.35 (*)    Hemoglobin 10.0 (*)    HCT 30.9 (*)    RDW 16.3 (*)    All other components within normal limits  TROPONIN I     ____________________________________________   EKG  ED ECG REPORT I, Roy N Gloriana Piltz, the attending physician, personally viewed and interpreted this ECG.   Date:  09/24/2015  EKG Time: 12:04 AM  Rate: 68  Rhythm: Normal sinus rhythm  Axis: Normal  Intervals: Normal  ST&T Change: None   ____________________________________________    RADIOLOGY  DG Chest Port 1 View (Final result) Result time: 09/24/15 00:22:09   Final result by Rad Results In Interface (09/24/15 00:22:09)   Narrative:   CLINICAL DATA: 65 year old male with chest pain  EXAM: PORTABLE CHEST 1 VIEW  COMPARISON: Chest radiograph dated 02/19/2015  FINDINGS: Single portable view of the chest demonstrate clear lungs. There is no pleural effusion or pneumothorax. Stable cardiac silhouette. There is degenerative changes of the spine and AC joints. No acute osseous pathology.  IMPRESSION: No active disease.   Electronically Signed By: Anner Crete M.D. On: 09/24/2015 00:22      INITIAL IMPRESSION / ASSESSMENT AND PLAN / ED COURSE  Pertinent labs & imaging results that were available during my care of the patient were reviewed by me and considered in my medical decision making (see chart for details).  Patient received 324 mg of aspirin upon presentation to emergency department Given history of physical exam concern for possible cardiac etiology for chest pain. As such patient discussed with Dr. [facility] admission for further evaluation and management.  ____________________________________________   FINAL CLINICAL IMPRESSION(S) / ED DIAGNOSES  Final diagnoses:  Chest pain, unspecified chest pain type      Gregor Hams, MD 09/24/15 [phone]

## 2015-09-24 NOTE — Progress Notes (Signed)
Spoke with dr. Lavetta Nielsen regarding no insulin coverage. Patient is on scheduled accuchecks. Patient did have personal disposable insulin pump however patient removed it this am. Per md with place orders for insulin coverage

## 2015-09-24 NOTE — Progress Notes (Signed)
[facility] [place] NAME: Dennis Mullen    MRN#:  [id number]  DATE OF BIRTH:  September 24, 1950  SUBJECTIVE:  Hospital Day: none Dennis Mullen is a 65 y.o. male presenting with Chest Pain .   Overnight events: No overnight events Interval Events: Currently chest pain-free  REVIEW OF SYSTEMS:  CONSTITUTIONAL: No fever, fatigue or weakness.  EYES: No blurred or double vision.  EARS, NOSE, AND THROAT: No tinnitus or ear pain.  RESPIRATORY: No cough, shortness of breath, wheezing or hemoptysis.  CARDIOVASCULAR: No chest pain, orthopnea, edema.  GASTROINTESTINAL: No nausea, vomiting, diarrhea or abdominal pain.  GENITOURINARY: No dysuria, hematuria.  ENDOCRINE: No polyuria, nocturia,  HEMATOLOGY: No anemia, easy bruising or bleeding SKIN: No rash or lesion. MUSCULOSKELETAL: No joint pain or arthritis.   NEUROLOGIC: No tingling, numbness, weakness.  PSYCHIATRY: No anxiety or depression.   DRUG ALLERGIES:  No Known Allergies  VITALS:  Blood pressure 145/68, pulse 69, temperature 97.8 F (36.6 C), temperature source Oral, resp. rate 20, height 6\' 2"  (1.88 m), weight 300 lb (136.079 kg), SpO2 97 %.  PHYSICAL EXAMINATION:  VITAL SIGNS: Filed Vitals:   09/24/15 0310 09/24/15 1118  BP: 141/63 145/68  Pulse: 63 69  Temp: 97.6 F (36.4 C) 97.8 F (36.6 C)  Resp: 15 20   GENERAL:64 y.o.male currently in no acute distress.  HEAD: Normocephalic, atraumatic.  EYES: Pupils equal, round, reactive to light. Extraocular muscles intact. No scleral icterus.  MOUTH: Moist mucosal membrane. Dentition intact. No abscess noted.  EAR, NOSE, THROAT: Clear without exudates. No external lesions.  NECK: Supple. No thyromegaly. No nodules. No JVD.  PULMONARY: Clear to ascultation, without wheeze rails or rhonci. No use of accessory muscles, Good respiratory effort. good air entry bilaterally CHEST: Nontender to palpation.  CARDIOVASCULAR: S1 and S2.  Regular rate and rhythm. No murmurs, rubs, or gallops. No edema. Pedal pulses 2+ bilaterally.  GASTROINTESTINAL: Soft, nontender, nondistended. No masses. Positive bowel sounds. No hepatosplenomegaly.  MUSCULOSKELETAL: No swelling, clubbing, or edema. Range of motion full in all extremities.  NEUROLOGIC: Cranial nerves II through XII are intact. No gross focal neurological deficits. Sensation intact. Reflexes intact.  SKIN: No ulceration, lesions, rashes, or cyanosis. Skin warm and dry. Turgor intact.  PSYCHIATRIC: Mood, affect within normal limits. The patient is awake, alert and oriented x 3. Insight, judgment intact.      LABORATORY PANEL:   CBC  Recent Labs Lab 09/24/15 0003  WBC 6.7  HGB 10.0*  HCT 30.9*  PLT 190   ------------------------------------------------------------------------------------------------------------------  Chemistries   Recent Labs Lab 09/24/15 0335  NA 139  K 5.3*  CL 108  CO2 23  GLUCOSE 165*  BUN 22*  CREATININE 1.02  CALCIUM 8.6*   ------------------------------------------------------------------------------------------------------------------  Cardiac Enzymes  Recent Labs Lab 09/24/15 1019  TROPONINI <0.03   ------------------------------------------------------------------------------------------------------------------  RADIOLOGY:  Dg Chest Port 1 View  09/24/2015  CLINICAL DATA:  65 year old male with chest pain EXAM: PORTABLE CHEST 1 VIEW COMPARISON:  Chest radiograph dated 02/19/2015 FINDINGS: Single portable view of the chest demonstrate clear lungs. There is no pleural effusion or pneumothorax. Stable cardiac silhouette. There is degenerative changes of the spine and AC joints. No acute osseous pathology. IMPRESSION: No active disease. Electronically Signed   By: Anner Crete M.D.   On: 09/24/2015 00:22    EKG:   Orders placed or performed during the hospital encounter of 09/23/15  . ED EKG  . ED EKG  . EKG  12-Lead   . EKG 12-Lead    ASSESSMENT AND PLAN:   Dennis Mullen is a 65 y.o. male presenting with Chest Pain . Admitted 09/23/2015 : Day #: none 1. Central chest pain: Cardiology input appreciated, for stress test in the morning continue aspirin, Plavix, statin and beta blocker 2. Essential hypertension: Norvasc, metoprolol,hyzaar 3. Hypothyroidism unspecified Synthroid 4. Type 2 diabetes non-insulin-requiring metformin, would hold Actos given history of heart problems-echo pending  All the records are reviewed and case discussed with Care Management/Social Workerr. Management plans discussed with the patient, family and they are in agreement.  CODE STATUS: full TOTAL TIME TAKING CARE OF THIS PATIENT: 28 minutes.   POSSIBLE D/C IN 1DAYS, DEPENDING ON CLINICAL CONDITION.   Hower,  Karenann Cai.D on 09/24/2015 at 2:00 PM  Between 7am to 6pm - Pager - [phone]  After 6pm: House Pager: - [facility]  Office  [phone]  CC: Primary care physician; Lavera Guise, MD

## 2015-09-24 NOTE — Consult Note (Signed)
[facility]  CARDIOLOGY CONSULT NOTE  Patient ID: Dennis Mullen MRN: [id number] DOB/AGE: 12-18-1950 65 y.o.  Admit date: 09/23/2015 Referring Physician Dr. Lavetta Nielsen Primary Physician Dr. Clayborn Bigness Primary Cardiologist Dr. [name] Reason for Consultation chest pain  HPI: Pt is a 65 y o male with history of cad s/p pci on several occcasions including a nstemi several years ago who was admitted after complaining of severe mid sternal chest pain. He states it was somewhat different than his angina. He has been compliant with his meds. On arrival in the er, his ekg was normal. He has ruled out for an mi with negative troponin times 3. His most recent stress test was last year. He is currently on metoprolol succinate, crestor,  Asa and plavix. He is currently pain free. Telemetry shows normal sinus rhythm.   Review of Systems  Constitutional: Negative.   HENT: Negative.   Eyes: Negative.   Respiratory: Negative.   Cardiovascular: Positive for chest pain.  Gastrointestinal: Negative.   Genitourinary: Negative.   Musculoskeletal: Negative.   Skin: Negative.   Neurological: Negative.   Endo/Heme/Allergies: Negative.   Psychiatric/Behavioral: Negative.     Past Medical History  Diagnosis Date  . Obesity   . Diabetes mellitus, type II ([place])   . Thyroid disease   . Anxiety   . Bipolar disorder ([place])   . History of borderline personality disorder   . Depression   . Neuropathy ([place])   . Diabetes mellitus, type II, insulin dependent ([place])   . Hypertension   . CHF (congestive heart failure) ([place])     ?  Marland Kitchen CAD (coronary artery disease)   . Gout   . Mood disorder ([place])   . Insomnia   . PVD (peripheral vascular disease) ([place])   . Abnormal levels of other serum enzymes   . GERD (gastroesophageal reflux disease)   . BPH (benign prostatic hyperplasia)   . ED (erectile dysfunction)   . Hypogonadism in male     Family History  Problem Relation  Age of Onset  . Lung cancer Mother   . Heart attack Father   . Post-traumatic stress disorder Father   . Anxiety disorder Father   . Depression Father   . Heart attack Sister   . Heart attack Brother     Social History   Social History  . Marital Status: Single    Spouse Name: N/A  . Number of Children: N/A  . Years of Education: N/A   Occupational History  . retired    Social History Main Topics  . Smoking status: Never Smoker   . Smokeless tobacco: Never Used  . Alcohol Use: No  . Drug Use: No  . Sexual Activity: Not Currently    Birth Control/ Protection: None   Other Topics Concern  . Not on file   Social History Narrative   ** Merged History Encounter **        Past Surgical History  Procedure Laterality Date  . Tonsillectomy and adenoidectomy    . Nasal septum surgery    . Mastoidectomy Right   . Penial inplant    . Coronary angioplasty with stent placement      x5 stents  . Right mastoidectomy    . Penile prosthesis implant    . Foot surgery    . Scrotoplasty       Prescriptions prior to admission  Medication Sig Dispense Refill Last Dose  . ACCU-CHEK SMARTVIEW test strip  09/23/2015 at Unknown time  . allopurinol (ZYLOPRIM) 100 MG tablet Take 100 mg by mouth every morning.   09/23/2015 at Unknown time  . amLODipine (NORVASC) 5 MG tablet Take 2.5 mg by mouth daily.    09/23/2015 at Unknown time  . aspirin EC 81 MG tablet Take 81 mg by mouth every morning. Reported on 07/26/2015   09/23/2015 at 0800  . clopidogrel (PLAVIX) 75 MG tablet Take 75 mg by mouth every morning. Reported on 07/26/2015   09/23/2015 at Unknown time  . colchicine 0.6 MG tablet Take 0.6 mg by mouth 2 (two) times daily as needed.   prn at Unknown time  . diclofenac (VOLTAREN) 75 MG EC tablet Take 75 mg by mouth 2 (two) times daily as needed.   prn at Unknown time  . divalproex (DEPAKOTE ER) 500 MG 24 hr tablet Take 1 tablet (500 mg total) by mouth 3 (three) times daily between meals. 90 tablet  5 09/23/2015 at Unknown time  . DULoxetine (CYMBALTA) 60 MG capsule Take 1 capsule (60 mg total) by mouth daily. 30 capsule 5 09/23/2015 at Unknown time  . esomeprazole (NEXIUM) 40 MG capsule Take 40 mg by mouth daily at 12 noon.    09/23/2015 at Unknown time  . furosemide (LASIX) 20 MG tablet Take 40 mg by mouth daily.    09/23/2015 at Unknown time  . gemfibrozil (LOPID) 600 MG tablet Take 600 mg by mouth 2 (two) times daily before a meal.   09/23/2015 at Unknown time  . insulin aspart (NOVOLOG) 100 UNIT/ML injection Inject into the skin. Take up to 66 units dialy as directed via VGO kit which equals to 1.25 units per hour with  Boluses as needed (up to 12 units at meals)   09/24/2015 at Unknown time  . Insulin Disposable Pump (V-GO 30) KIT Reported on 06/29/2015   09/24/2015 at Unknown time  . Insulin Pen Needle (NOVOFINE) 32G X 6 MM MISC Reported on 07/26/2015   09/23/2015 at Unknown time  . levothyroxine (SYNTHROID, LEVOTHROID) 50 MCG tablet Take 50 mcg by mouth daily before breakfast.   09/23/2015 at Unknown time  . losartan-hydrochlorothiazide (HYZAAR) 100-25 MG tablet    09/23/2015 at Unknown time  . metFORMIN (GLUCOPHAGE) 1000 MG tablet Take 1,000 mg by mouth 2 (two) times daily with a meal.   09/23/2015 at Unknown time  . metoprolol succinate (TOPROL-XL) 50 MG 24 hr tablet Take 50 mg by mouth daily.    09/23/2015 at Unknown time  . nitroGLYCERIN (NITROSTAT) 0.4 MG SL tablet Place 0.4 mg under the tongue every 5 (five) minutes as needed.   prn at prn  . pioglitazone (ACTOS) 15 MG tablet Take 15 mg by mouth every morning.    09/23/2015 at Unknown time  . pregabalin (LYRICA) 100 MG capsule Take 100 mg by mouth 3 (three) times daily.   09/23/2015 at Unknown time  . rOPINIRole (REQUIP) 1 MG tablet Take 1 mg by mouth at bedtime.    Past Week at Unknown time  . rosuvastatin (CRESTOR) 5 MG tablet Take 5 mg by mouth daily at 6 PM.    Past Week at Unknown time  . testosterone (ANDROGEL) 50 MG/5GM (1%) GEL Place 5 g onto the skin  daily. Reported on 08/03/2015   Past Week at Unknown time  . VICTOZA 18 MG/3ML SOPN Inject 1.8 mg into the skin daily.    09/23/2015 at Unknown time  . zolpidem (AMBIEN) 10 MG tablet Take 10 mg by mouth  at bedtime. Reported on 07/26/2015   Past Week at Unknown time    Physical Exam: Blood pressure 145/68, pulse 69, temperature 97.8 F (36.6 C), temperature source Oral, resp. rate 20, height 6' 2"  (1.88 m), weight 136.079 kg (300 lb), SpO2 97 %.   Wt Readings from Last 1 Encounters:  09/24/15 136.079 kg (300 lb)     General appearance: alert and cooperative Head: Normocephalic, without obvious abnormality, atraumatic Resp: clear to auscultation bilaterally Chest wall: no tenderness Cardio: regular rate and rhythm Extremities: extremities normal, atraumatic, no cyanosis or edema Neurologic: Grossly normal  Labs:   Lab Results  Component Value Date   WBC 6.7 09/24/2015   HGB 10.0* 09/24/2015   HCT 30.9* 09/24/2015   MCV 92.4 09/24/2015   PLT 190 09/24/2015    Recent Labs Lab 09/24/15 0335  NA 139  K 5.3*  CL 108  CO2 23  BUN 22*  CREATININE 1.02  CALCIUM 8.6*  GLUCOSE 165*   Lab Results  Component Value Date   CKTOTAL 249 08/15/2013   CKMB 7.8* 08/15/2013   TROPONINI <0.03 09/24/2015      Radiology: no acute cardiopulmonary disease EKG: nsr with no ischemia  ASSESSMENT AND PLAN:  Pt with history of cad s/p pci admitted with chest pain with both typical and atypical features. EKG was normal and he has ruled out for an mi. Currently pain free. On good medical regimen. Would recommend ambulating this afternoon and proceeding with lexiscan stress in am witih further recs based on results. If he develps signficant symptoms while ambulating, will need cardiac cath.  Signed: Teodoro Spray MD, [facility] 09/24/2015, 1:45 PM

## 2015-09-24 NOTE — ED Notes (Signed)
Labs were drawn and sent. Denies need for anything at this time.

## 2015-09-25 ENCOUNTER — Observation Stay: Admit: 2015-09-25 | Payer: Medicare HMO

## 2015-09-25 ENCOUNTER — Observation Stay: Payer: Medicare HMO

## 2015-09-25 ENCOUNTER — Encounter: Payer: Self-pay | Admitting: Radiology

## 2015-09-25 LAB — BASIC METABOLIC PANEL
Anion gap: 5 (ref 5–15)
BUN: 21 mg/dL — ABNORMAL HIGH (ref 6–20)
CHLORIDE: 108 mmol/L (ref 101–111)
CO2: 26 mmol/L (ref 22–32)
CREATININE: 1.08 mg/dL (ref 0.61–1.24)
Calcium: 8.8 mg/dL — ABNORMAL LOW (ref 8.9–10.3)
GFR calc non Af Amer: >60 mL/min (ref >60)
Glucose, Bld: 89 mg/dL (ref 65–99)
POTASSIUM: 5.5 mmol/L — AB (ref 3.5–5.1)
SODIUM: 139 mmol/L (ref 135–145)

## 2015-09-25 LAB — NM MYOCAR MULTI W/SPECT W/WALL MOTION / EF
CHL CUP NUCLEAR SSS: 2
LV dias vol: 1 mL (ref 62–150)
LVSYSVOL: 49 mL
NUC STRESS TID: 1
SDS: 2
SRS: 3

## 2015-09-25 LAB — GLUCOSE, CAPILLARY
GLUCOSE-CAPILLARY: 234 mg/dL — AB (ref 65–99)
GLUCOSE-CAPILLARY: 340 mg/dL — AB (ref 65–99)

## 2015-09-25 MED ORDER — FUROSEMIDE 40 MG PO TABS: 40.0000 mg | ORAL_TABLET | Freq: Every day | ORAL | Status: DC

## 2015-09-25 MED ORDER — TECHNETIUM TC 99M TETROFOSMIN IV KIT
13.0000 | PACK | Freq: Once | INTRAVENOUS | Status: AC | PRN
  Administered 2015-09-25: 12.807 via INTRAVENOUS

## 2015-09-25 MED ORDER — TECHNETIUM TC 99M TETROFOSMIN IV KIT
30.0000 | PACK | Freq: Once | INTRAVENOUS | Status: AC | PRN
  Administered 2015-09-25: 30.479 via INTRAVENOUS

## 2015-09-25 MED ORDER — SODIUM POLYSTYRENE SULFONATE 15 GM/60ML PO SUSP
30.0000 g | Freq: Once | ORAL | Status: AC
  Administered 2015-09-25: 30 g via ORAL
  Filled 2015-09-25: qty 120

## 2015-09-25 MED ORDER — REGADENOSON 0.4 MG/5ML IV SOLN
0.4000 mg | Freq: Once | INTRAVENOUS | Status: DC
  Filled 2015-09-25: qty 5

## 2015-09-25 NOTE — Discharge Instructions (Signed)
Stop taking losartan hct Recommend checking potassium this week      Chest Pain Observation It is often hard to give a specific diagnosis for the cause of chest pain. Among other possibilities your symptoms might be caused by inadequate oxygen delivery to your heart (angina). Angina that is not treated or evaluated can lead to a heart attack (myocardial infarction) or death. Blood tests, electrocardiograms, and X-rays may have been done to help determine a possible cause of your chest pain. After evaluation and observation, your health care provider has determined that it is unlikely your pain was caused by an unstable condition that requires hospitalization. However, a full evaluation of your pain may need to be completed, with additional diagnostic testing as directed. It is very important to keep your follow-up appointments. Not keeping your follow-up appointments could result in permanent heart damage, disability, or death. If there is any problem keeping your follow-up appointments, you must call your health care provider. HOME CARE INSTRUCTIONS  Due to the slight chance that your pain could be angina, it is important to follow your health care provider's treatment plan and also maintain a healthy lifestyle:  Maintain or work toward achieving a healthy weight.  Stay physically active and exercise regularly.  Decrease your salt intake.  Eat a balanced, healthy diet. Talk to a dietitian to learn about heart-healthy foods.  Increase your fiber intake by including whole grains, vegetables, fruits, and nuts in your diet.  Avoid situations that cause stress, anger, or depression.  Take medicines as advised by your health care provider. Report any side effects to your health care provider. Do not stop medicines or adjust the dosages on your own.  Quit smoking. Do not use nicotine patches or gum until you check with your health care provider.  Keep your blood pressure, blood sugar, and  cholesterol levels within normal limits.  Limit alcohol intake to no more than 1 drink per day for women who are not pregnant and 2 drinks per day for men.  Do not abuse drugs. SEEK IMMEDIATE MEDICAL CARE IF: You have severe chest pain or pressure which may include symptoms such as:  You feel pain or pressure in your arms, neck, jaw, or back.  You have severe back or abdominal pain, feel sick to your stomach (nauseous), or throw up (vomit).  You are sweating profusely.  You are having a fast or irregular heartbeat.  You feel short of breath while at rest.  You notice increasing shortness of breath during rest, sleep, or with activity.  You have chest pain that does not get better after rest or after taking your usual medicine.  You wake from sleep with chest pain.  You are unable to sleep because you cannot breathe.  You develop a frequent cough or you are coughing up blood.  You feel dizzy, faint, or experience extreme fatigue.  You develop severe weakness, dizziness, fainting, or chills. Any of these symptoms may represent a serious problem that is an emergency. Do not wait to see if the symptoms will go away. Call your local emergency services (911 in the [place] Do not drive yourself to the hospital. MAKE SURE YOU:  Understand these instructions.  Will watch your condition.  Will get help right away if you are not doing well or get worse.   This information is not intended to replace advice given to you by your health care provider. Make sure you discuss any questions you have with your health care provider.  Document Released: 05/06/2010 Document Revised: 04/08/2013 Document Reviewed: 10/03/2012 Elsevier Interactive Patient Education [employer].

## 2015-09-25 NOTE — Progress Notes (Signed)
Pt prepared for discharge. IV and tele removed. Discharge instructions and paper rx reviewed in detail with patient. Copy of Discharge Instructions signed by RN and patient, placed in pt's folder. Pt was escorted out by volunteer.

## 2015-09-25 NOTE — Discharge Summary (Signed)
[facility] NAME: Dennis Mullen    MR#:  [id number]  DATE OF BIRTH:  Jul 25, 1950  DATE OF ADMISSION:  09/23/2015 ADMITTING PHYSICIAN: Saundra Shelling, MD  DATE OF DISCHARGE: 09/25/2015 11:58 AM  PRIMARY CARE PHYSICIAN: Lavera Guise, MD    ADMISSION DIAGNOSIS:  Chest pain, unspecified chest pain type [R07.9]  DISCHARGE DIAGNOSIS:  Principal Problem:   Chest pain at rest   SECONDARY DIAGNOSIS:   Past Medical History  Diagnosis Date  . Obesity   . Diabetes mellitus, type II ([place])   . Thyroid disease   . Anxiety   . Bipolar disorder ([place])   . History of borderline personality disorder   . Depression   . Neuropathy ([place])   . Diabetes mellitus, type II, insulin dependent ([place])   . Hypertension   . CHF (congestive heart failure) ([place])     ?  Marland Kitchen CAD (coronary artery disease)   . Gout   . Mood disorder ([place])   . Insomnia   . PVD (peripheral vascular disease) ([place])   . Abnormal levels of other serum enzymes   . GERD (gastroesophageal reflux disease)   . BPH (benign prostatic hyperplasia)   . ED (erectile dysfunction)   . Hypogonadism in male   . OSA (obstructive sleep apnea)     HOSPITAL COURSE:   1. Chest pain. Patient admitted as an observation. Cardiac enzymes were negative 3. Stress test was a low risk scan and he was discharged home in stable condition. 2. Hyperkalemia. Patient given a dose of Kayexalate. He did not want to stay in the hospital for repeat check of potassium. I stopped his losartan HCT. Lasix are prescribed on a daily basis 3. Type 2 diabetes mellitus continue usual medications 4. Essential hypertension continue usual medications except for losartan HCT 5. History of mood disorder continue psychiatric medications 6. Hyperlipidemia unspecified on Crestor 7. Hypothyroidism unspecified on levothyroxine  DISCHARGE CONDITIONS:   Satisfactory  CONSULTS OBTAINED:  Treatment Team:  Teodoro Spray,  MD  DRUG ALLERGIES:  No Known Allergies  DISCHARGE MEDICATIONS:   Discharge Medication List as of 09/25/2015 11:33 AM    CONTINUE these medications which have CHANGED   Details  furosemide (LASIX) 40 MG tablet Take 1 tablet (40 mg total) by mouth daily., Starting 09/25/2015, Until Discontinued, Print      CONTINUE these medications which have NOT CHANGED   Details  ACCU-CHEK SMARTVIEW test strip Historical Med    allopurinol (ZYLOPRIM) 100 MG tablet Take 100 mg by mouth every morning., Until Discontinued, Historical Med    amLODipine (NORVASC) 5 MG tablet Take 2.5 mg by mouth daily. , Starting 10/14/2014, Until Discontinued, Historical Med    aspirin EC 81 MG tablet Take 81 mg by mouth every morning. Reported on 07/26/2015, Until Discontinued, Historical Med    clopidogrel (PLAVIX) 75 MG tablet Take 75 mg by mouth every morning. Reported on 07/26/2015, Until Discontinued, Historical Med    colchicine 0.6 MG tablet Take 0.6 mg by mouth 2 (two) times daily as needed., Until Discontinued, Historical Med    diclofenac (VOLTAREN) 75 MG EC tablet Take 75 mg by mouth 2 (two) times daily as needed., Until Discontinued, Historical Med    divalproex (DEPAKOTE ER) 500 MG 24 hr tablet Take 1 tablet (500 mg total) by mouth 3 (three) times daily between meals., Starting 04/27/2015, Until Discontinued, Normal    DULoxetine (CYMBALTA) 60 MG capsule Take 1 capsule (60 mg  total) by mouth daily., Starting 04/27/2015, Until Discontinued, Normal    esomeprazole (NEXIUM) 40 MG capsule Take 40 mg by mouth daily at 12 noon. , Starting 10/14/2014, Until Discontinued, Historical Med    gemfibrozil (LOPID) 600 MG tablet Take 600 mg by mouth 2 (two) times daily before a meal., Until Discontinued, Historical Med    insulin aspart (NOVOLOG) 100 UNIT/ML injection Inject into the skin. Take up to 66 units dialy as directed via VGO kit which equals to 1.25 units per hour with  Boluses as needed (up to 12 units at  meals), Until Discontinued, Historical Med    Insulin Disposable Pump (V-GO 30) KIT Reported on 06/29/2015, Starting 10/07/2014, Until Discontinued, Historical Med    Insulin Pen Needle (NOVOFINE) 32G X 6 MM MISC Reported on 07/26/2015, Starting 11/07/2013, Until Discontinued, Historical Med    levothyroxine (SYNTHROID, LEVOTHROID) 50 MCG tablet Take 50 mcg by mouth daily before breakfast., Until Discontinued, Historical Med    metFORMIN (GLUCOPHAGE) 1000 MG tablet Take 1,000 mg by mouth 2 (two) times daily with a meal., Until Discontinued, Historical Med    metoprolol succinate (TOPROL-XL) 50 MG 24 hr tablet Take 50 mg by mouth daily. , Starting 09/11/2014, Until Discontinued, Historical Med    nitroGLYCERIN (NITROSTAT) 0.4 MG SL tablet Place 0.4 mg under the tongue every 5 (five) minutes as needed., Until Discontinued, Historical Med    pioglitazone (ACTOS) 15 MG tablet Take 15 mg by mouth every morning. , Until Discontinued, Historical Med    pregabalin (LYRICA) 100 MG capsule Take 100 mg by mouth 3 (three) times daily., Until Discontinued, Historical Med    rOPINIRole (REQUIP) 1 MG tablet Take 1 mg by mouth at bedtime. , Starting 04/27/2015, Until Discontinued, Historical Med    rosuvastatin (CRESTOR) 5 MG tablet Take 5 mg by mouth daily at 6 PM. , Starting 07/05/2015, Until Discontinued, Historical Med    testosterone (ANDROGEL) 50 MG/5GM (1%) GEL Place 5 g onto the skin daily. Reported on 08/03/2015, Until Discontinued, Historical Med    VICTOZA 18 MG/3ML SOPN Inject 1.8 mg into the skin daily. , Starting 09/07/2014, Until Discontinued, Historical Med    zolpidem (AMBIEN) 10 MG tablet Take 10 mg by mouth at bedtime. Reported on 07/26/2015, Starting 05/20/2015, Until Discontinued, Historical Med      STOP taking these medications     losartan-hydrochlorothiazide (HYZAAR) 100-25 MG tablet          DISCHARGE INSTRUCTIONS:   Follow-up PMD one week  If you experience worsening of your  admission symptoms, develop shortness of breath, life threatening emergency, suicidal or homicidal thoughts you must seek medical attention immediately by calling 911 or calling your MD immediately  if symptoms less severe.  You Must read complete instructions/literature along with all the possible adverse reactions/side effects for all the Medicines you take and that have been prescribed to you. Take any new Medicines after you have completely understood and accept all the possible adverse reactions/side effects.   Please note  You were cared for by a hospitalist during your hospital stay. If you have any questions about your discharge medications or the care you received while you were in the hospital after you are discharged, you can call the unit and asked to speak with the hospitalist on call if the hospitalist that took care of you is not available. Once you are discharged, your primary care physician will handle any further medical issues. Please note that NO REFILLS for any discharge medications will  be authorized once you are discharged, as it is imperative that you return to your primary care physician (or establish a relationship with a primary care physician if you do not have one) for your aftercare needs so that they can reassess your need for medications and monitor your lab values.    Today   CHIEF COMPLAINT:   Chief Complaint  Patient presents with  . Chest Pain    HISTORY OF PRESENT ILLNESS:  Dennis Mullen  is a 65 y.o. male admitted with chest pain   VITAL SIGNS:  Blood pressure 123/53, pulse 62, temperature 97.7 F (36.5 C), temperature source Oral, resp. rate 20, height _0  (1.88 m), weight 136.079 kg (300 lb), SpO2 96 %.    PHYSICAL EXAMINATION:  GENERAL:  65 y.o.-year-old patient lying in the bed with no acute distress.  EYES: Pupils equal, round, reactive to light and accommodation. No scleral icterus. Extraocular muscles intact.  HEENT: Head atraumatic,  normocephalic. Oropharynx and nasopharynx clear.  NECK:  Supple, no jugular venous distention. No thyroid enlargement, no tenderness.  LUNGS: Normal breath sounds bilaterally, no wheezing, rales,rhonchi or crepitation. No use of accessory muscles of respiration.  CARDIOVASCULAR: S1, S2 normal. No murmurs, rubs, or gallops.  ABDOMEN: Soft, non-tender, non-distended. Bowel sounds present. No organomegaly or mass.  EXTREMITIES: Trace edema, no cyanosis, or clubbing.  NEUROLOGIC: Cranial nerves II through XII are intact. Muscle strength 5/5 in all extremities. Sensation intact. Gait not checked.  PSYCHIATRIC: The patient is alert and oriented x 3.  SKIN: No obvious rash, lesion, or ulcer.   DATA REVIEW:   CBC  Recent Labs Lab 09/24/15 0003  WBC 6.7  HGB 10.0*  HCT 30.9*  PLT 190    Chemistries   Recent Labs Lab 09/25/15 0448  NA 139  K 5.5*  CL 108  CO2 26  GLUCOSE 89  BUN 21*  CREATININE 1.08  CALCIUM 8.8*    Cardiac Enzymes  Recent Labs Lab 09/24/15 1527  TROPONINI <0.03      RADIOLOGY:  Nm Myocar Multi W/spect W/wall Motion / Ef  09/25/2015   The study is normal.  This is a low risk study.  The left ventricular ejection fraction is mildly decreased (45-54%).  Normal myoview with slightly reduced lv funciton globally No evidence of ischemia. Low risk study.   Dg Chest Port 1 View  09/24/2015  CLINICAL DATA:  65 year old male with chest pain EXAM: PORTABLE CHEST 1 VIEW COMPARISON:  Chest radiograph dated 02/19/2015 FINDINGS: Single portable view of the chest demonstrate clear lungs. There is no pleural effusion or pneumothorax. Stable cardiac silhouette. There is degenerative changes of the spine and AC joints. No acute osseous pathology. IMPRESSION: No active disease. Electronically Signed   By: Anner Crete M.D.   On: 09/24/2015 00:22    Management plans discussed with the patient, family and they are in agreement.  CODE STATUS:  Code Status History     Date Active Date Inactive Code Status Order ID Comments User Context   09/24/2015  3:12 AM 09/25/2015  3:06 PM Full Code [id number]  Saundra Shelling, MD Inpatient   11/14/2014  5:07 AM 11/15/2014  4:53 PM Full Code [id number]  Harrie Foreman, MD ED    Advance Directive Documentation        Most Recent Value   Type of Advance Directive  Healthcare Power of Derma, Living will Henrietta Dine is son Larkin Ina Netzley]   Pre-existing out of facility DNR order (yellow form or pink  MOST form)     "MOST" Form in Place?        TOTAL TIME TAKING CARE OF THIS PATIENT: 35 minutes.    Loletha Grayer M.D on 09/25/2015 at 4:17 PM  Between 7am to 6pm - Pager - [phone]  After 6pm go to [email or web address] - password [email or web address] [facility]  [phone]  CC: Primary care physician; Lavera Guise, MD

## 2015-09-25 NOTE — Progress Notes (Addendum)
[place]  SUBJECTIVE: No further chest pain this am. Lexiscan sestimibi completed with no abnormalities on stress ekg. Sestimibi images pending.    Filed Vitals:   09/24/15 0230 09/24/15 0310 09/24/15 1118 09/24/15 1929  BP: 162/76 141/63 145/68 121/56  Pulse:  63 69 58  Temp:  97.6 F (36.4 C) 97.8 F (36.6 C) 97.7 F (36.5 C)  TempSrc:  Oral Oral Oral  Resp: 16 15 20    Height:      Weight:      SpO2:  100% 97% 96%    Intake/Output Summary (Last 24 hours) at 09/25/15 0902 Last data filed at 09/25/15 0144  Gross per 24 hour  Intake    240 ml  Output   1575 ml  Net  -1335 ml    LABS: Basic Metabolic Panel:  Recent Labs  09/24/15 0335 09/25/15 0448  NA 139 139  K 5.3* 5.5*  CL 108 108  CO2 23 26  GLUCOSE 165* 89  BUN 22* 21*  CREATININE 1.02 1.08  CALCIUM 8.6* 8.8*   Liver Function Tests: No results for input(s): AST, ALT, ALKPHOS, BILITOT, PROT, ALBUMIN in the last 72 hours. No results for input(s): LIPASE, AMYLASE in the last 72 hours. CBC:  Recent Labs  09/24/15 0003  WBC 6.7  HGB 10.0*  HCT 30.9*  MCV 92.4  PLT 190   Cardiac Enzymes:  Recent Labs  09/24/15 0335 09/24/15 1019 09/24/15 1527  TROPONINI <0.03 <0.03 <0.03   BNP: Invalid input(s): POCBNP D-Dimer: No results for input(s): DDIMER in the last 72 hours. Hemoglobin A1C:  Recent Labs  09/24/15 1019  HGBA1C 6.4*   Fasting Lipid Panel:  Recent Labs  09/24/15 0335  CHOL 152  HDL 48  LDLCALC 46  TRIG 291*  CHOLHDL 3.2   Thyroid Function Tests: No results for input(s): TSH, T4TOTAL, T3FREE, THYROIDAB in the last 72 hours.  Invalid input(s): FREET3 Anemia Panel: No results for input(s): VITAMINB12, FOLATE, FERRITIN, TIBC, IRON, RETICCTPCT in the last 72 hours.   Physical Exam: Blood pressure 121/56, pulse 58, temperature 97.7 F (36.5 C), temperature source Oral, resp. rate 20, height 6\' 2"  (1.88 m), weight 136.079 kg (300 lb),  SpO2 96 %.   Wt Readings from Last 1 Encounters:  09/24/15 136.079 kg (300 lb)     General appearance: alert and cooperative Resp: clear to auscultation bilaterally Cardio: regular rate and rhythm Extremities: extremities normal, atraumatic, no cyanosis or edema Neurologic: Grossly normal  TELEMETRY: Reviewed telemetry pt in nsr:  ASSESSMENT AND PLAN:  Principal Problem:   Chest pain at rest-ruled out for mi. EKG unchanged. Would continue current meds. If functional study negative for ischemia, would disgarge on current meds with out patient follow up with Dr. [name]. If functional study reveals ischemia, will determine further workup.   Teodoro Spray., MD, [facility] 09/25/2015 9:02 AM    Myoview showed no evidence of ischemia. OK for discharge from  Cardiac stand point on asa 81 mg daily; plavix 75 mg daily, hctz 25 mg daily; femfibrozil 600 mg bid; metoprolol xl 50 mg daily; amlodipine 2.5 mg daily; and crestor 5 mg daily from cardiac standpoint. Follow up with [name] next week.

## 2015-10-08 DIAGNOSIS — Z794 Long term (current) use of insulin: Secondary | ICD-10-CM | POA: Insufficient documentation

## 2015-10-08 DIAGNOSIS — Z7984 Long term (current) use of oral hypoglycemic drugs: Secondary | ICD-10-CM | POA: Insufficient documentation

## 2015-10-08 DIAGNOSIS — I509 Heart failure, unspecified: Secondary | ICD-10-CM | POA: Insufficient documentation

## 2015-10-08 DIAGNOSIS — F319 Bipolar disorder, unspecified: Secondary | ICD-10-CM | POA: Insufficient documentation

## 2015-10-08 DIAGNOSIS — R0602 Shortness of breath: Secondary | ICD-10-CM | POA: Diagnosis present

## 2015-10-08 DIAGNOSIS — I252 Old myocardial infarction: Secondary | ICD-10-CM | POA: Insufficient documentation

## 2015-10-08 DIAGNOSIS — E1149 Type 2 diabetes mellitus with other diabetic neurological complication: Secondary | ICD-10-CM | POA: Insufficient documentation

## 2015-10-08 DIAGNOSIS — E039 Hypothyroidism, unspecified: Secondary | ICD-10-CM | POA: Diagnosis not present

## 2015-10-08 DIAGNOSIS — Z7902 Long term (current) use of antithrombotics/antiplatelets: Secondary | ICD-10-CM | POA: Diagnosis not present

## 2015-10-08 DIAGNOSIS — R0789 Other chest pain: Secondary | ICD-10-CM | POA: Insufficient documentation

## 2015-10-08 DIAGNOSIS — I2510 Atherosclerotic heart disease of native coronary artery without angina pectoris: Secondary | ICD-10-CM | POA: Insufficient documentation

## 2015-10-08 DIAGNOSIS — Z955 Presence of coronary angioplasty implant and graft: Secondary | ICD-10-CM | POA: Diagnosis not present

## 2015-10-08 DIAGNOSIS — Z7982 Long term (current) use of aspirin: Secondary | ICD-10-CM | POA: Diagnosis not present

## 2015-10-08 DIAGNOSIS — I110 Hypertensive heart disease with heart failure: Secondary | ICD-10-CM | POA: Diagnosis not present

## 2015-10-08 DIAGNOSIS — Z79899 Other long term (current) drug therapy: Secondary | ICD-10-CM | POA: Diagnosis not present

## 2015-10-09 ENCOUNTER — Emergency Department
Admission: EM | Admit: 2015-10-09 | Discharge: 2015-10-09 | Disposition: A | Discharge status: Home / Self Care | Payer: Medicare HMO | Attending: Emergency Medicine | Admitting: Emergency Medicine

## 2015-10-09 ENCOUNTER — Emergency Department: Payer: Medicare HMO

## 2015-10-09 ENCOUNTER — Other Ambulatory Visit: Payer: Self-pay

## 2015-10-09 DIAGNOSIS — R079 Chest pain, unspecified: Secondary | ICD-10-CM

## 2015-10-09 LAB — BASIC METABOLIC PANEL
Anion gap: 10 (ref 5–15)
BUN: 30 mg/dL — AB (ref 6–20)
CHLORIDE: 105 mmol/L (ref 101–111)
CO2: 22 mmol/L (ref 22–32)
CREATININE: 1.46 mg/dL — AB (ref 0.61–1.24)
Calcium: 9.1 mg/dL (ref 8.9–10.3)
GFR calc Af Amer: 58 mL/min — ABNORMAL LOW (ref >60)
GFR calc non Af Amer: 50 mL/min — ABNORMAL LOW (ref >60)
Glucose, Bld: 236 mg/dL — ABNORMAL HIGH (ref 65–99)
Potassium: 5.3 mmol/L — ABNORMAL HIGH (ref 3.5–5.1)
Sodium: 137 mmol/L (ref 135–145)

## 2015-10-09 LAB — CBC
HEMATOCRIT: 31.5 % — AB (ref 40.0–52.0)
HEMOGLOBIN: 10.4 g/dL — AB (ref 13.0–18.0)
MCH: 30.8 pg (ref 26.0–34.0)
MCHC: 33.1 g/dL (ref 32.0–36.0)
MCV: 93 fL (ref 80.0–100.0)
Platelets: 285 10*3/uL (ref 150–440)
RBC: 3.38 MIL/uL — ABNORMAL LOW (ref 4.40–5.90)
RDW: 16.5 % — ABNORMAL HIGH (ref 11.5–14.5)
WBC: 6.7 10*3/uL (ref 3.8–10.6)

## 2015-10-09 LAB — TROPONIN I

## 2015-10-09 MED ORDER — SODIUM CHLORIDE 0.9 % IV BOLUS (SEPSIS)
1000.0000 mL | Freq: Once | INTRAVENOUS | Status: AC
  Administered 2015-10-09: 1000 mL via INTRAVENOUS

## 2015-10-09 NOTE — ED Notes (Signed)
Discharge instructions reviewed with patient. Patient verbalized understanding. Patient ambulated to lobby without difficulty.   

## 2015-10-09 NOTE — ED Notes (Signed)
Patient transported to X-ray 

## 2015-10-09 NOTE — ED Notes (Signed)
Patient reports started with mid sternal non radiating chest pain for approximately 2 hours.  Reports now the pain comes and goes.  States feels the same as previous heart attack.

## 2015-10-09 NOTE — ED Provider Notes (Addendum)
[facility] Emergency Department Provider Note   ____________________________________________  Time seen: Approximately 0030 AM  I have reviewed the triage vital signs and the nursing notes.   HISTORY  Chief Complaint Chest Pain    HPI Dennis Mullen is a 65 y.o. male who comes into the hospital today with chest pain and shortness of breath. The patient reports that it started around 10 PM. He was shopping when the pain started. It was similar to when he had pain 2 weeks ago but the pain 2 weeks ago was much worse. The patient has a history of 3 heart attacks with 5 stents. He had all of this test done while he was here admitted previously and his stress test and nuclear medicine studies were all negative. The patient reports that he wants to get to the bottom of this chest pain. His cardiologist is Dr. [name] but he saw Dr. Ubaldo Glassing when he was in the hospital previously. The patient reports that he does not think that he needs to have the tests redone. The patient reports that the pain is in his mid chest but denies any radiation, nausea, vomiting and sweats. The patient is here for evaluation of his chest pain.Patient reports that the pain is gone at this time.   Past Medical History  Diagnosis Date  . Obesity   . Diabetes mellitus, type II ([place])   . Thyroid disease   . Anxiety   . Bipolar disorder ([place])   . History of borderline personality disorder   . Depression   . Neuropathy ([place])   . Diabetes mellitus, type II, insulin dependent ([place])   . Hypertension   . CHF (congestive heart failure) ([place])     ?  Marland Kitchen CAD (coronary artery disease)   . Gout   . Mood disorder ([place])   . Insomnia   . PVD (peripheral vascular disease) ([place])   . Abnormal levels of other serum enzymes   . GERD (gastroesophageal reflux disease)   . BPH (benign prostatic hyperplasia)   . ED (erectile dysfunction)   . Hypogonadism in male   . OSA (obstructive sleep apnea)          Rheumatic heart disease  Patient Active Problem List   Diagnosis Date Noted  . Chest pain at rest 09/24/2015  . Elevated PSA 07/26/2015  . Hypogonadism in male 07/26/2015  . 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 BPH with obstruction/lower urinary tract symptoms 06/29/2015  . Bipolar 1 disorder, mixed ([employer]) 04/14/2015  . Borderline personality disorder 04/14/2015  . Acute renal failure ([place]) 11/15/2014  . Chest pain 11/14/2014  . Dizziness 11/05/2014  . Benign essential HTN 11/04/2014  . Combined fat and carbohydrate induced hyperlipemia 11/04/2014  . Bipolar 2 disorder ([place]) 10/27/2014  . Acquired hypothyroidism 02/03/2014  . Type 2 diabetes mellitus ([place]) 02/03/2014  . Adiposity 01/06/2014  . Obesity, diabetes, and hypertension syndrome ([place]) 12/23/2013  . Long term current use of insulin ([place]) 12/23/2013  . Type 2 diabetes mellitus with other diabetic neurological complication ([place]) 55/97/4163  . Acute inflammation of the pancreas 11/18/2013  . Elevated cholesterol with elevated triglycerides 11/18/2013  . CAD in native artery 09/26/2013  . Diabetes mellitus ([place]) 09/26/2013  . Acute non-ST elevation myocardial infarction (NSTEMI) ([place]) 09/26/2013  . Non-ST elevation (NSTEMI) myocardial infarction [facility]) 09/26/2013    Past Surgical History  Procedure Laterality Date  . Tonsillectomy and adenoidectomy    . Nasal septum surgery    .  Mastoidectomy Right   . Penial inplant    . Coronary angioplasty with stent placement      x5 stents  . Right mastoidectomy    . Penile prosthesis implant    . Foot surgery    . Scrotoplasty      Current Outpatient Rx  Name  Route  Sig  Dispense  Refill  . ACCU-CHEK SMARTVIEW test strip                 Dispense as written.   Marland Kitchen allopurinol (ZYLOPRIM) 100 MG tablet   Oral   Take 100 mg by mouth every morning.         Marland Kitchen amLODipine (NORVASC) 5 MG tablet   Oral   Take 2.5 mg by mouth daily.          Marland Kitchen aspirin  EC 81 MG tablet   Oral   Take 81 mg by mouth every morning. Reported on 07/26/2015         . clopidogrel (PLAVIX) 75 MG tablet   Oral   Take 75 mg by mouth every morning. Reported on 07/26/2015         . colchicine 0.6 MG tablet   Oral   Take 0.6 mg by mouth 2 (two) times daily as needed.         . diclofenac (VOLTAREN) 75 MG EC tablet   Oral   Take 75 mg by mouth 2 (two) times daily as needed.         . divalproex (DEPAKOTE ER) 500 MG 24 hr tablet   Oral   Take 1 tablet (500 mg total) by mouth 3 (three) times daily between meals. Patient taking differently: Take 500-1,000 mg by mouth 2 (two) times daily. 500 mg in the morning, 1000 mg qhs   90 tablet   5   . DULoxetine (CYMBALTA) 60 MG capsule   Oral   Take 1 capsule (60 mg total) by mouth daily.   30 capsule   5   . esomeprazole (NEXIUM) 40 MG capsule   Oral   Take 40 mg by mouth daily at 12 noon.          . furosemide (LASIX) 40 MG tablet   Oral   Take 1 tablet (40 mg total) by mouth daily.   30 tablet   0   . gemfibrozil (LOPID) 600 MG tablet   Oral   Take 600 mg by mouth 2 (two) times daily before a meal.         . glucose 4 GM chewable tablet   Oral   Chew 1 tablet by mouth as needed for low blood sugar.         . insulin aspart (NOVOLOG) 100 UNIT/ML injection   Subcutaneous   Inject into the skin. Take up to 66 units dialy as directed via VGO kit which equals to 0.8 units per hour with  Boluses as needed (up to 12 units at meals)         . Insulin Disposable Pump (V-GO 30) KIT      Reported on 06/29/2015         . Insulin Pen Needle (NOVOFINE) 32G X 6 MM MISC      Reported on 07/26/2015         . levothyroxine (SYNTHROID, LEVOTHROID) 50 MCG tablet   Oral   Take 50 mcg by mouth daily before breakfast.         . metFORMIN (GLUCOPHAGE) 1000  MG tablet   Oral   Take 1,000 mg by mouth 2 (two) times daily with a meal.         . metoprolol succinate (TOPROL-XL) 50 MG 24 hr  tablet   Oral   Take 50 mg by mouth daily.          . nitroGLYCERIN (NITROSTAT) 0.4 MG SL tablet   Sublingual   Place 0.4 mg under the tongue every 5 (five) minutes as needed.         . pioglitazone (ACTOS) 15 MG tablet   Oral   Take 15 mg by mouth every morning.          . pregabalin (LYRICA) 100 MG capsule   Oral   Take 100 mg by mouth 3 (three) times daily.         Marland Kitchen rOPINIRole (REQUIP) 1 MG tablet   Oral   Take 1 mg by mouth at bedtime.          . rosuvastatin (CRESTOR) 5 MG tablet   Oral   Take 5 mg by mouth daily at 6 PM.          . VICTOZA 18 MG/3ML SOPN   Subcutaneous   Inject 1.8 mg into the skin daily.            Dispense as written.   . vitamin C (ASCORBIC ACID) 500 MG tablet   Oral   Take 500 mg by mouth daily as needed.         . zolpidem (AMBIEN) 10 MG tablet   Oral   Take 10 mg by mouth at bedtime. Reported on 07/26/2015           Allergies Review of patient's allergies indicates no known allergies.  Family History  Problem Relation Age of Onset  . Lung cancer Mother   . Heart attack Father   . Post-traumatic stress disorder Father   . Anxiety disorder Father   . Depression Father   . Heart attack Sister   . Heart attack Brother     Social History Social History  Substance Use Topics  . Smoking status: Never Smoker   . Smokeless tobacco: Never Used  . Alcohol Use: No    Review of Systems Constitutional: No fever/chills Eyes: No visual changes. ENT: No sore throat. Cardiovascular: chest pain. Respiratory:  shortness of breath. Gastrointestinal: No abdominal pain.  No nausea, no vomiting.  No diarrhea.  No constipation. Genitourinary: Negative for dysuria. Musculoskeletal: Negative for back pain. Skin: Negative for rash. Neurological: Negative for headaches, focal weakness or numbness.  10-point ROS otherwise negative.  ____________________________________________   PHYSICAL EXAM:  VITAL SIGNS: ED Triage  Vitals  Enc Vitals Group     BP 10/09/15 0008 142/55 mmHg     Pulse Rate 10/09/15 0008 78     Resp 10/09/15 0008 20     Temp 10/09/15 0008 98.7 F (37.1 C)     Temp Source 10/09/15 0008 Oral     SpO2 10/09/15 0008 97 %     Weight 10/09/15 0008 225 lb (102.059 kg)     Height 10/09/15 0008 6' 2"  (1.88 m)     Head Cir --      Peak Flow --      Pain Score 10/09/15 0010 5     Pain Loc --      Pain Edu? --      Excl. in [place]? --     Constitutional: Alert and oriented. Well  appearing and in no acute distress. Eyes: Conjunctivae are normal. PERRL. EOMI. Head: Atraumatic. Nose: No congestion/rhinnorhea. Mouth/Throat: Mucous membranes are moist.  Oropharynx non-erythematous. Cardiovascular: Normal rate, regular rhythm. Systolic murmur.  Good peripheral circulation. Respiratory: Normal respiratory effort.  No retractions. Lungs CTAB. Gastrointestinal: Soft and nontender. No distention. Positive bowel sounds Musculoskeletal: No lower extremity tenderness nor edema.   Neurologic:  Normal speech and language.  Skin:  Skin is warm, dry and intact.  Psychiatric: Mood and affect are normal.  ____________________________________________   LABS (all labs ordered are listed, but only abnormal results are displayed)  Labs Reviewed  BASIC METABOLIC PANEL - Abnormal; Notable for the following:    Potassium 5.3 (*)    Glucose, Bld 236 (*)    BUN 30 (*)    Creatinine, Ser 1.46 (*)    GFR calc non Af Amer 50 (*)    GFR calc Af Amer 58 (*)    All other components within normal limits  CBC - Abnormal; Notable for the following:    RBC 3.38 (*)    Hemoglobin 10.4 (*)    HCT 31.5 (*)    RDW 16.5 (*)    All other components within normal limits  TROPONIN I  TROPONIN I   ____________________________________________  EKG  ED ECG REPORT I, Loney Hering, the attending physician, personally viewed and interpreted this ECG.   Date: 10/09/2015  EKG Time: 0006  Rate: 77  Rhythm: normal  sinus rhythm  Axis: normal  Intervals:none  ST&T Change: Flipped T waves in leads 1 and aVL seen on EKG from 09/24/15  ____________________________________________  RADIOLOGY  CXR: No acute cardiopulmonary process ____________________________________________   PROCEDURES  Procedure(s) performed: None  Critical Care performed: No  ____________________________________________   INITIAL IMPRESSION / ASSESSMENT AND PLAN / ED COURSE  Pertinent labs & imaging results that were available during my care of the patient were reviewed by me and considered in my medical decision making (see chart for details).  This is a 65 year old male who comes into the hospital today with some chest pain. The patient was seen 2 weeks ago for the same thing. The patient wants to figure out what exactly causing his symptoms. His pain is gone at this time. I will repeat the patient's troponin and I will reassess the patient. He will receive a liter of normal saline.  The patient's repeat troponin is unremarkable. The patient did receive a liter of normal saline. We did have a discussion about the patient needing a likely catheterization to further evaluate his pain. The patient reports that he does not want to go that route at this time. He feels that he wants to talk with Dr. [name]. The patient reports he will contact the office and discuss the next necessary steps with Dr. coffee. Otherwise the patient has no further pain or concerns. He'll be discharged home. ____________________________________________   FINAL CLINICAL IMPRESSION(S) / ED DIAGNOSES  Final diagnoses:  Chest pain, unspecified chest pain type      NEW MEDICATIONS STARTED DURING THIS VISIT:  New Prescriptions   No medications on file     Note:  This document was prepared using Dragon voice recognition software and may include unintentional dictation errors.    Loney Hering, MD 10/09/15 2536  Loney Hering,  MD 10/09/15 [id number]

## 2015-10-09 NOTE — Discharge Instructions (Signed)
Nonspecific Chest Pain  °Chest pain can be caused by many different conditions. There is always a chance that your pain could be related to something serious, such as a heart attack or a blood clot in your lungs. Chest pain can also be caused by conditions that are not life-threatening. If you have chest pain, it is very important to follow up with your health care provider. °CAUSES  °Chest pain can be caused by: °· Heartburn. °· Pneumonia or bronchitis. °· Anxiety or stress. °· Inflammation around your heart (pericarditis) or lung (pleuritis or pleurisy). °· A blood clot in your lung. °· A collapsed lung (pneumothorax). It can develop suddenly on its own (spontaneous pneumothorax) or from trauma to the chest. °· Shingles infection (varicella-zoster virus). °· Heart attack. °· Damage to the bones, muscles, and cartilage that make up your chest wall. This can include: °¨ Bruised bones due to injury. °¨ Strained muscles or cartilage due to frequent or repeated coughing or overwork. °¨ Fracture to one or more ribs. °¨ Sore cartilage due to inflammation (costochondritis). °RISK FACTORS  °Risk factors for chest pain may include: °· Activities that increase your risk for trauma or injury to your chest. °· Respiratory infections or conditions that cause frequent coughing. °· Medical conditions or overeating that can cause heartburn. °· Heart disease or family history of heart disease. °· Conditions or health behaviors that increase your risk of developing a blood clot. °· Having had chicken pox (varicella zoster). °SIGNS AND SYMPTOMS °Chest pain can feel like: °· Burning or tingling on the surface of your chest or deep in your chest. °· Crushing, pressure, aching, or squeezing pain. °· Dull or sharp pain that is worse when you move, cough, or take a deep breath. °· Pain that is also felt in your back, neck, shoulder, or arm, or pain that spreads to any of these areas. °Your chest pain may come and go, or it may stay  constant. °DIAGNOSIS °Lab tests or other studies may be needed to find the cause of your pain. Your health care provider may have you take a test called an ambulatory ECG (electrocardiogram). An ECG records your heartbeat patterns at the time the test is performed. You may also have other tests, such as: °· Transthoracic echocardiogram (TTE). During echocardiography, sound waves are used to create a picture of all of the heart structures and to look at how blood flows through your heart. °· Transesophageal echocardiogram (TEE). This is a more advanced imaging test that obtains images from inside your body. It allows your health care provider to see your heart in finer detail. °· Cardiac monitoring. This allows your health care provider to monitor your heart rate and rhythm in real time. °· Holter monitor. This is a portable device that records your heartbeat and can help to diagnose abnormal heartbeats. It allows your health care provider to track your heart activity for several days, if needed. °· Stress tests. These can be done through exercise or by taking medicine that makes your heart beat more quickly. °· Blood tests. °· Imaging tests. °TREATMENT  °Your treatment depends on what is causing your chest pain. Treatment may include: °· Medicines. These may include: °¨ Acid blockers for heartburn. °¨ Anti-inflammatory medicine. °¨ Pain medicine for inflammatory conditions. °¨ Antibiotic medicine, if an infection is present. °¨ Medicines to dissolve blood clots. °¨ Medicines to treat coronary artery disease. °· Supportive care for conditions that do not require medicines. This may include: °¨ Resting. °¨ Applying heat   or cold packs to injured areas. °¨ Limiting activities until pain decreases. °HOME CARE INSTRUCTIONS °· If you were prescribed an antibiotic medicine, finish it all even if you start to feel better. °· Avoid any activities that bring on chest pain. °· Do not use any tobacco products, including  cigarettes, chewing tobacco, or electronic cigarettes. If you need help quitting, ask your health care provider. °· Do not drink alcohol. °· Take medicines only as directed by your health care provider. °· Keep all follow-up visits as directed by your health care provider. This is important. This includes any further testing if your chest pain does not go away. °· If heartburn is the cause for your chest pain, you may be told to keep your head raised (elevated) while sleeping. This reduces the chance that acid will go from your stomach into your esophagus. °· Make lifestyle changes as directed by your health care provider. These may include: °¨ Getting regular exercise. Ask your health care provider to suggest some activities that are safe for you. °¨ Eating a heart-healthy diet. A registered dietitian can help you to learn healthy eating options. °¨ Maintaining a healthy weight. °¨ Managing diabetes, if necessary. °¨ Reducing stress. °SEEK MEDICAL CARE IF: °· Your chest pain does not go away after treatment. °· You have a rash with blisters on your chest. °· You have a fever. °SEEK IMMEDIATE MEDICAL CARE IF:  °· Your chest pain is worse. °· You have an increasing cough, or you cough up blood. °· You have severe abdominal pain. °· You have severe weakness. °· You faint. °· You have chills. °· You have sudden, unexplained chest discomfort. °· You have sudden, unexplained discomfort in your arms, back, neck, or jaw. °· You have shortness of breath at any time. °· You suddenly start to sweat, or your skin gets clammy. °· You feel nauseous or you vomit. °· You suddenly feel light-headed or dizzy. °· Your heart begins to beat quickly, or it feels like it is skipping beats. °These symptoms may represent a serious problem that is an emergency. Do not wait to see if the symptoms will go away. Get medical help right away. Call your local emergency services (911 in the [place] Do not drive yourself to the hospital. °  °This  information is not intended to replace advice given to you by your health care provider. Make sure you discuss any questions you have with your health care provider. °  °Document Released: 01/11/2005 Document Revised: 04/24/2014 Document Reviewed: 11/07/2013 °[employer]vier Interactive Patient Education ©2016 [employer] ° °

## 2015-10-10 ENCOUNTER — Other Ambulatory Visit: Payer: Self-pay

## 2015-10-10 ENCOUNTER — Encounter: Payer: Self-pay | Admitting: Emergency Medicine

## 2015-10-10 ENCOUNTER — Emergency Department
Admission: EM | Admit: 2015-10-10 | Discharge: 2015-10-10 | Disposition: A | Discharge status: Home / Self Care | Payer: Medicare HMO | Attending: Emergency Medicine | Admitting: Emergency Medicine

## 2015-10-10 ENCOUNTER — Emergency Department: Payer: Medicare HMO

## 2015-10-10 DIAGNOSIS — I252 Old myocardial infarction: Secondary | ICD-10-CM | POA: Insufficient documentation

## 2015-10-10 DIAGNOSIS — I110 Hypertensive heart disease with heart failure: Secondary | ICD-10-CM | POA: Diagnosis not present

## 2015-10-10 DIAGNOSIS — J810 Acute pulmonary edema: Secondary | ICD-10-CM | POA: Diagnosis not present

## 2015-10-10 DIAGNOSIS — R079 Chest pain, unspecified: Secondary | ICD-10-CM | POA: Diagnosis present

## 2015-10-10 DIAGNOSIS — Z794 Long term (current) use of insulin: Secondary | ICD-10-CM | POA: Insufficient documentation

## 2015-10-10 DIAGNOSIS — I2510 Atherosclerotic heart disease of native coronary artery without angina pectoris: Secondary | ICD-10-CM | POA: Insufficient documentation

## 2015-10-10 DIAGNOSIS — E1151 Type 2 diabetes mellitus with diabetic peripheral angiopathy without gangrene: Secondary | ICD-10-CM | POA: Insufficient documentation

## 2015-10-10 DIAGNOSIS — E1149 Type 2 diabetes mellitus with other diabetic neurological complication: Secondary | ICD-10-CM | POA: Diagnosis not present

## 2015-10-10 DIAGNOSIS — F3160 Bipolar disorder, current episode mixed, unspecified: Secondary | ICD-10-CM | POA: Diagnosis not present

## 2015-10-10 DIAGNOSIS — I509 Heart failure, unspecified: Secondary | ICD-10-CM | POA: Diagnosis not present

## 2015-10-10 DIAGNOSIS — Z7984 Long term (current) use of oral hypoglycemic drugs: Secondary | ICD-10-CM | POA: Diagnosis not present

## 2015-10-10 DIAGNOSIS — E039 Hypothyroidism, unspecified: Secondary | ICD-10-CM | POA: Insufficient documentation

## 2015-10-10 DIAGNOSIS — R0789 Other chest pain: Secondary | ICD-10-CM

## 2015-10-10 DIAGNOSIS — Z79899 Other long term (current) drug therapy: Secondary | ICD-10-CM | POA: Insufficient documentation

## 2015-10-10 DIAGNOSIS — R0602 Shortness of breath: Secondary | ICD-10-CM | POA: Diagnosis not present

## 2015-10-10 LAB — BASIC METABOLIC PANEL
ANION GAP: 6 (ref 5–15)
BUN: 23 mg/dL — AB (ref 6–20)
CALCIUM: 8.8 mg/dL — AB (ref 8.9–10.3)
CO2: 24 mmol/L (ref 22–32)
CREATININE: 1.26 mg/dL — AB (ref 0.61–1.24)
Chloride: 107 mmol/L (ref 101–111)
GFR calc Af Amer: >60 mL/min (ref >60)
GFR, EST NON AFRICAN AMERICAN: 59 mL/min — AB (ref >60)
GLUCOSE: 354 mg/dL — AB (ref 65–99)
Potassium: 5.6 mmol/L — ABNORMAL HIGH (ref 3.5–5.1)
Sodium: 137 mmol/L (ref 135–145)

## 2015-10-10 LAB — CBC
HCT: 32 % — ABNORMAL LOW (ref 40.0–52.0)
HEMOGLOBIN: 10.4 g/dL — AB (ref 13.0–18.0)
MCH: 30.3 pg (ref 26.0–34.0)
MCHC: 32.5 g/dL (ref 32.0–36.0)
MCV: 93.3 fL (ref 80.0–100.0)
Platelets: 271 10*3/uL (ref 150–440)
RBC: 3.43 MIL/uL — ABNORMAL LOW (ref 4.40–5.90)
RDW: 16.9 % — AB (ref 11.5–14.5)
WBC: 7.7 10*3/uL (ref 3.8–10.6)

## 2015-10-10 LAB — FIBRIN DERIVATIVES D-DIMER (ARMC ONLY): Fibrin derivatives D-dimer (ARMC): 919 — ABNORMAL HIGH (ref 0–499)

## 2015-10-10 LAB — TROPONIN I: TROPONIN I: 0.03 ng/mL (ref <0.031)

## 2015-10-10 LAB — BRAIN NATRIURETIC PEPTIDE: B Natriuretic Peptide: 181 pg/mL — ABNORMAL HIGH (ref 0.0–100.0)

## 2015-10-10 MED ORDER — FUROSEMIDE 10 MG/ML IJ SOLN
20.0000 mg | Freq: Once | INTRAMUSCULAR | Status: AC
  Administered 2015-10-10: 20 mg via INTRAVENOUS
  Filled 2015-10-10: qty 4

## 2015-10-10 MED ORDER — IOPAMIDOL (ISOVUE-370) INJECTION 76%
100.0000 mL | Freq: Once | INTRAVENOUS | Status: AC | PRN
Start: 2015-10-10 — End: 2015-10-10
  Administered 2015-10-10: 100 mL via INTRAVENOUS

## 2015-10-10 NOTE — ED Notes (Signed)
Pt has insulin delivery system placed to right upper arm at this time that he came from home with

## 2015-10-10 NOTE — Discharge Instructions (Signed)
Pulmonary Edema °Pulmonary edema (PE) is a condition in which fluid collects in the lungs. This makes it hard to breathe. PE may be a result of the heart not pumping very well or a result of injury.  °CAUSES  °· Coronary artery disease causes blockages in the arteries of the heart. This deprives the heart muscle of oxygen and weakens the muscle. A heart attack is a form of coronary artery disease. °· High blood pressure causes the heart muscle to work harder than usual. Over time, the heart muscle may get stiff, and it starts to work less efficiently. It may also fatigue and weaken. °· Viral infection of the heart (myocarditis) may weaken the heart muscle. °· Metabolic conditions such as thyroid disease, excessive alcohol use, certain vitamin deficiencies, or diabetes may also weaken the heart muscle. °· Leaky or stiff heart valves may impair normal heart function. °· Lung disease may strain the heart muscle. °· Excessive demands on the heart such as too much salt or fluid intake. °· Failure to take prescribed medicines. °· Lung injury from heat or toxins, such as poisonous gas. °· Infection in the lungs or other parts of the body. °· Fluid overload caused by kidney failure or medicines. °SYMPTOMS  °· Shortness of breath at rest or with exertion. °· Grunting, wheezing, or gurgling while breathing. °· Feeling like you cannot get enough air. °· Breaths are shallow and fast. °· A lot of coughing with frothy or bloody mucus. °· Skin may become cool, damp, and turn a pale or bluish color. °DIAGNOSIS  °Initial diagnosis may be based on your history, symptoms, and a physical examination. Additional tests for PE may include: °· Electrocardiography. °· Chest X-ray. °· Blood tests. °· Stress test. °· Ultrasound evaluation of the heart (echocardiography). °· Evaluation by a heart doctor (cardiologist). °· Test of the heart arteries to look for blockages (angiography). °· Check of blood oxygen. °TREATMENT  °Treatment of PE will  depend on the underlying cause and will focus first on relieving the symptoms.  °· Extra oxygen to make breathing easier and assist with removing mucus. This may include breathing treatments or a tube into the lungs and a breathing machine. °· Medicine to help the body get rid of extra water, usually through an IV tube. °· Medicine to help the heart pump better. °· If poor heart function is the cause, treatment may include: °¨ Procedures to open blocked arteries, repair damaged heart valves, or remove some of the damaged heart muscle. °¨ A pacemaker to help the heart pump with less effort. °HOME CARE INSTRUCTIONS  °· Your health care provider will help you determine what type of exercise program may be helpful. It is important to maintain strength and increase it if possible. Pace your activities to avoid shortness of breath or chest pain. Rest for at least 1 hour before and after meals. Cardiac rehabilitation programs are available in some locations. °· Eat a heart-healthy diet low in salt, saturated fat, and cholesterol. Ask for help with choices. °· Make a list of every medicine, vitamin, or herbal supplement you are taking. Keep the list with you at all times. Show it to your health care provider at every visit and before starting a new medicine. Keep the list up to date. °· Ask your health care provider or pharmacist to help you write a plan or schedule so that you know things about each medicine such as: °¨ Why you are taking it. °¨ The possible side effects. °¨   The best time of day to take it.  Foods to take with it or avoid.  When to stop taking it.  Record your hospital or clinic weight. When you get home, compare it to your scale and record your weight. Then, weigh yourself first thing in the morning daily, and record the weights. You should weigh yourself every morning after you urinate and before you eat breakfast. Wear the same amount of clothing each time you weigh yourself. Provide your health  care provider with your weight record. Daily weights are important in the early recognition of excess fluid. Tell your health care provider right away if you have gained 03 lb/1.4 kg in 1 day, 05 lb/2.3 kg in a week, or as directed by your health care provider. Your medicines may need to be adjusted.  Blood pressure monitoring should be done as often as directed. You can get a home blood pressure cuff at your drugstore. Record these values and bring them with you for your clinic visits. Notify your health care provider if you become dizzy or light-headed when standing up.  If you are currently a smoker, it is time to quit. Nicotine makes your heart work harder and is one of the leading causes of cardiac deaths. Do not use nicotine gum or patches before talking to your doctor.  Make a follow-up appointment with your health care provider as directed.  Ask your health care provider for a copy of your latest heart tracing (ECG) and keep a copy with you at all times. SEEK IMMEDIATE MEDICAL CARE IF:   You have severe chest pain, especially if the pain is crushing or pressure-like and spreads to the arms, back, neck, or jaw. THIS IS AN EMERGENCY. Do not wait to see if the pain will go away. Call for local emergency medical help. Do not drive yourself to the hospital.  You have sweating, feel sick to your stomach (nauseous), or are experiencing shortness of breath.  Your weight increases by 03 lb/1.4 kg in 1 day or 05 lb/2.3 kg in a week.  You notice increasing shortness of breath that is unusual for you. This may happen during rest, sleep, or with activity.  You develop chest pain (angina) or pain that is unusual for you.  You notice more swelling in your hands, feet, ankles, or abdomen.  You notice lasting (persistent) dizziness, blurred vision, headache, or unsteadiness.  You begin to cough up bloody mucus (sputum).  You are unable to sleep because it is hard to breathe.  You begin to feel a  "jumping" or "fluttering" sensation (palpitations) in the chest that is unusual for you. MAKE SURE YOU:  Understand these instructions.  Will watch your condition.  Will get help right away if you are not doing well or get worse.   This information is not intended to replace advice given to you by your health care provider. Make sure you discuss any questions you have with your health care provider.   Document Released: 06/24/2002 Document Revised: 04/08/2013 Document Reviewed: 12/09/2012 Elsevier Interactive Patient Education 2016 [place].  Please increase your Lasix to 40 mg per day. Please continue all of your other medications and continue with outpatient follow-up with your cardiologist. Return emergency department especially for persistent discomfort, fever or any other new concerns.

## 2015-10-10 NOTE — ED Provider Notes (Signed)
Time Seen: Approximately 1135 I have reviewed the triage notes  Chief Complaint: Shortness of Breath and Chest Pain   History of Present Illness: Dennis Mullen is a 65 y.o. male who was recently evaluated here for chest pain. Patient has a known history of previous stent. Patient was admitted earlier this month and especially received extensive testing including a stress test which was negative. He does have a local cardiologist and he states it has been some discussion about her repeat heart catheterization. He states his last catheterization was done approximately 2 years ago. Patient denies any current chest pain and states it's been sexually intermittent over the last 48 hours. Patient had ruled out on his last visit with serial cardiac enzymes. The patient also describes some shortness of breath and states he felt like he heard some wheezing this morning. He was given a DuoNeb by EMS and states some symptomatic improvement. He denies any history of asthma though records shows that he has had previous episode of pulmonary edema. He denies any nausea, vomiting, jaw, arm pain, back pain. He denies any calf tenderness or new swelling in the lower extremities.   Past Medical History  Diagnosis Date  . Obesity   . Diabetes mellitus, type II ([place])   . Thyroid disease   . Anxiety   . Bipolar disorder ([place])   . History of borderline personality disorder   . Depression   . Neuropathy ([place])   . Diabetes mellitus, type II, insulin dependent ([place])   . Hypertension   . CHF (congestive heart failure) ([place])     ?  Marland Kitchen CAD (coronary artery disease)   . Gout   . Mood disorder ([place])   . Insomnia   . PVD (peripheral vascular disease) ([place])   . Abnormal levels of other serum enzymes   . GERD (gastroesophageal reflux disease)   . BPH (benign prostatic hyperplasia)   . ED (erectile dysfunction)   . Hypogonadism in male   . OSA (obstructive sleep apnea)     Patient Active Problem List    Diagnosis Date Noted  . Chest pain at rest 09/24/2015  . Elevated PSA 07/26/2015  . Hypogonadism in male 07/26/2015  . 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 BPH with obstruction/lower urinary tract symptoms 06/29/2015  . Bipolar 1 disorder, mixed ([place]) 04/14/2015  . Borderline personality disorder 04/14/2015  . Acute renal failure ([place]) 11/15/2014  . Chest pain 11/14/2014  . Dizziness 11/05/2014  . Benign essential HTN 11/04/2014  . Combined fat and carbohydrate induced hyperlipemia 11/04/2014  . Bipolar 2 disorder ([place]) 10/27/2014  . Acquired hypothyroidism 02/03/2014  . Type 2 diabetes mellitus ([place]) 02/03/2014  . Adiposity 01/06/2014  . Obesity, diabetes, and hypertension syndrome ([place]) 12/23/2013  . Long term current use of insulin ([place]) 12/23/2013  . Type 2 diabetes mellitus with other diabetic neurological complication ([place]) 25/00/3704  . Acute inflammation of the pancreas 11/18/2013  . Elevated cholesterol with elevated triglycerides 11/18/2013  . CAD in native artery 09/26/2013  . Diabetes mellitus ([place]) 09/26/2013  . Acute non-ST elevation myocardial infarction (NSTEMI) ([place]) 09/26/2013  . Non-ST elevation (NSTEMI) myocardial infarction [facility]) 09/26/2013    Past Surgical History  Procedure Laterality Date  . Tonsillectomy and adenoidectomy    . Nasal septum surgery    . Mastoidectomy Right   . Penial inplant    . Coronary angioplasty with stent placement      x5 stents  . Right mastoidectomy    .  Penile prosthesis implant    . Foot surgery    . Scrotoplasty      Past Surgical History  Procedure Laterality Date  . Tonsillectomy and adenoidectomy    . Nasal septum surgery    . Mastoidectomy Right   . Penial inplant    . Coronary angioplasty with stent placement      x5 stents  . Right mastoidectomy    . Penile prosthesis implant    . Foot surgery    . Scrotoplasty      Current Outpatient Rx  Name  Route  Sig  Dispense  Refill  . ACCU-CHEK  SMARTVIEW test strip      Use as directed.           Dispense as written.   Marland Kitchen allopurinol (ZYLOPRIM) 100 MG tablet   Oral   Take 100 mg by mouth every morning.         Marland Kitchen amLODipine (NORVASC) 5 MG tablet   Oral   Take 2.5 mg by mouth daily.          Marland Kitchen aspirin EC 81 MG tablet   Oral   Take 81 mg by mouth every morning. Reported on 07/26/2015         . clopidogrel (PLAVIX) 75 MG tablet   Oral   Take 75 mg by mouth every morning. Reported on 07/26/2015         . colchicine 0.6 MG tablet   Oral   Take 0.6 mg by mouth 2 (two) times daily as needed.         . diclofenac (VOLTAREN) 75 MG EC tablet   Oral   Take 75 mg by mouth 2 (two) times daily as needed for mild pain or moderate pain.          . divalproex (DEPAKOTE ER) 500 MG 24 hr tablet   Oral   Take 1 tablet (500 mg total) by mouth 3 (three) times daily between meals. Patient taking differently: Take 500-1,000 mg by mouth 2 (two) times daily. 500 mg in the morning, 1000 mg qhs   90 tablet   5   . DULoxetine (CYMBALTA) 60 MG capsule   Oral   Take 1 capsule (60 mg total) by mouth daily.   30 capsule   5   . esomeprazole (NEXIUM) 40 MG capsule   Oral   Take 40 mg by mouth daily at 12 noon.          . furosemide (LASIX) 40 MG tablet   Oral   Take 1 tablet (40 mg total) by mouth daily.   30 tablet   0   . gemfibrozil (LOPID) 600 MG tablet   Oral   Take 600 mg by mouth 2 (two) times daily before a meal.         . glucose 4 GM chewable tablet   Oral   Chew 1 tablet by mouth as needed for low blood sugar.         . insulin aspart (NOVOLOG) 100 UNIT/ML injection   Subcutaneous   Inject into the skin. Take up to 66 units dialy as directed via VGO kit which equals to 0.8 units per hour with  Boluses as needed (up to 12 units at meals)         . Insulin Disposable Pump (V-GO 30) KIT      Reported on 06/29/2015         . Insulin Pen Needle (NOVOFINE) 32G X  6 MM MISC      Reported on  07/26/2015         . levothyroxine (SYNTHROID, LEVOTHROID) 50 MCG tablet   Oral   Take 50 mcg by mouth daily before breakfast.         . losartan-hydrochlorothiazide (HYZAAR) 100-25 MG tablet   Oral   Take 1 tablet by mouth daily.         . metFORMIN (GLUCOPHAGE) 1000 MG tablet   Oral   Take 1,000 mg by mouth 2 (two) times daily with a meal.         . metoprolol succinate (TOPROL-XL) 50 MG 24 hr tablet   Oral   Take 50 mg by mouth daily.          . nitroGLYCERIN (NITROSTAT) 0.4 MG SL tablet   Sublingual   Place 0.4 mg under the tongue every 5 (five) minutes x 3 doses as needed for chest pain.          . pioglitazone (ACTOS) 15 MG tablet   Oral   Take 15 mg by mouth every morning.          . pregabalin (LYRICA) 100 MG capsule   Oral   Take 100 mg by mouth 3 (three) times daily.         Marland Kitchen rOPINIRole (REQUIP) 1 MG tablet   Oral   Take 1 mg by mouth at bedtime.          . rosuvastatin (CRESTOR) 5 MG tablet   Oral   Take 5 mg by mouth daily at 6 PM.          . VICTOZA 18 MG/3ML SOPN   Subcutaneous   Inject 1.8 mg into the skin daily.            Dispense as written.   . vitamin C (ASCORBIC ACID) 500 MG tablet   Oral   Take 500 mg by mouth daily as needed.         . zolpidem (AMBIEN) 10 MG tablet   Oral   Take 10 mg by mouth at bedtime. Reported on 07/26/2015           Allergies:  Review of patient's allergies indicates no known allergies.  Family History: Family History  Problem Relation Age of Onset  . Lung cancer Mother   . Heart attack Father   . Post-traumatic stress disorder Father   . Anxiety disorder Father   . Depression Father   . Heart attack Sister   . Heart attack Brother     Social History: Social History  Substance Use Topics  . Smoking status: Never Smoker   . Smokeless tobacco: Never Used  . Alcohol Use: No     Review of Systems:   10 point review of systems was performed and was otherwise  negative:  Constitutional: No fever Eyes: No visual disturbances ENT: No sore throat, ear pain Cardiac: NoCurrent chest pain Respiratory: Positive for orthopnea and wheezing. No stridor Abdomen: No abdominal pain, no vomiting, No diarrhea Endocrine: No weight loss, No night sweats Extremities: No peripheral edema, cyanosis Skin: No rashes, easy bruising Neurologic: No focal weakness, trouble with speech or swollowing Urologic: No dysuria, Hematuria, or urinary frequency   Physical Exam:  ED Triage Vitals  Enc Vitals Group     BP 10/10/15 0927 149/61 mmHg     Pulse Rate 10/10/15 0927 78     Resp 10/10/15 0927 26     Temp 10/10/15  0927 98.2 F (36.8 C)     Temp Source 10/10/15 0927 Oral     SpO2 10/10/15 0917 99 %     Weight 10/10/15 0927 210 lb 12.8 oz (95.618 kg)     Height 10/10/15 0927 _0  (1.753 m)     Head Cir --      Peak Flow --      Pain Score 10/10/15 0920 5     Pain Loc --      Pain Edu? --      Excl. in [place]? --     General: Awake , Alert , and Oriented times 3; GCS 15 Head: Normal cephalic , atraumatic Eyes: Pupils equal , round, reactive to light Nose/Throat: No nasal drainage, patent upper airway without erythema or exudate.  Neck: Supple, Full range of motion, No anterior adenopathy or palpable thyroid masses. No JVD or bruits Lungs: Patient has some auscultated rales bilaterally at the bases right greater than the left. Reflexes cleared and there is no current wheezing at this time Heart: Regular rate, regular rhythm without murmurs , gallops , or rubs Abdomen: Soft, non tender without rebound, guarding , or rigidity; bowel sounds positive and symmetric in all 4 quadrants. No organomegaly .        Extremities: 2 plus symmetric pulses. No edema, clubbing or cyanosis Neurologic: normal ambulation, Motor symmetric without deficits, sensory intact Skin: warm, dry, no rashes   Labs:   All laboratory work was reviewed including any pertinent negatives or  positives listed below:  Labs Reviewed  BASIC METABOLIC PANEL - Abnormal; Notable for the following:    Potassium 5.6 (*)    Glucose, Bld 354 (*)    BUN 23 (*)    Creatinine, Ser 1.26 (*)    Calcium 8.8 (*)    GFR calc non Af Amer 59 (*)    All other components within normal limits  CBC - Abnormal; Notable for the following:    RBC 3.43 (*)    Hemoglobin 10.4 (*)    HCT 32.0 (*)    RDW 16.9 (*)    All other components within normal limits  FIBRIN DERIVATIVES D-DIMER ([facility] ONLY) - Abnormal; Notable for the following:    Fibrin derivatives D-dimer ([facility]) 919 (*)    All other components within normal limits  BRAIN NATRIURETIC PEPTIDE - Abnormal; Notable for the following:    B Natriuretic Peptide 181.0 (*)    All other components within normal limits  TROPONIN I  The patient's D-dimer test is elevated along with his BMP. His renal function is stable. Patient's hyperglycemic but hasn't had his typical medication yet this morning.  EKG:  ED ECG REPORT I, Daymon Larsen, the attending physician, personally viewed and interpreted this ECG.  Date: 10/10/2015 EKG Time: 0916 Rate: 77 Rhythm: normal sinus rhythm QRS Axis: normal Intervals: normal ST/T Wave abnormalities: normal Conduction Disturbances: none Narrative Interpretation: unremarkable Old anterior septal infarct Nonspecific ST-T wave abnormalities seen in the lateral leads which appear to be consistent in comparison to 10-09-2015   Radiology:        CT ANGIO CHEST PE W OR WO CONTRAST (Final result) Result time: 10/10/15 12:36:38   Final result by Rad Results In Interface (10/10/15 12:36:38)   Narrative:   CLINICAL DATA: Shortness of breath and chest pain. History of coronary artery disease with prior coronary stent pain.  EXAM: CT ANGIOGRAPHY CHEST WITH CONTRAST  TECHNIQUE: Multidetector CT imaging of the chest was performed using the  standard protocol during bolus administration of intravenous contrast.  Multiplanar CT image reconstructions and MIPs were obtained to evaluate the vascular anatomy.  CONTRAST: 100 mL Isovue 370 IV  COMPARISON: Chest x-ray earlier today.  FINDINGS: Vascular: The thoracic aorta and pulmonary arteries are well opacified. There is no evidence of pulmonary embolism. The thoracic aorta demonstrates no aneurysmal disease or dissection. Proximal great vessels are normally patent. Extensive calcified plaque is seen throughout the coronary tree in a 3 vessel distribution. The heart size is normal. There is some thinning of the myocardium at the left ventricular apex which could implicate prior infarct. The overall heart size appears normal. No pericardial fluid. There is extensive calcifications at the level of the aortic valve and aortic annulus.  Mediastinum/Nodes: No mediastinal masses or lymphadenopathy identified. No evidence of hilar lymphadenopathy.  Lungs/Pleura: Lungs show evidence of moderate pulmonary edema throughout all lobes with ground-glass edema present. Associated trace amount of pleural fluid bilaterally. No infiltrates, pneumothorax or pulmonary nodules.  Upper abdomen: Extensively calcified splenic artery. Visualized upper abdominal aorta is of normal caliber. Separate origins of a left gastric artery, celiac axis, superior mesenteric artery and bilateral single renal arteries demonstrate no significant stenosis.  Musculoskeletal: No bony abnormalities identified.  Review of the MIP images confirms the above findings.  IMPRESSION: 1. No evidence of pulmonary embolism or acute aortic pathology. 2. Coronary atherosclerosis with extensive calcified plaque seen in a 3 vessel distribution. Thinning of the myocardium at the left ventricular apex could implicate prior infarct. 3. Extensive aortic valvular and annular calcifications. 4. Moderate pulmonary edema with trace bilateral pleural effusions.   Electronically Signed By: Aletta Edouard M.D. On: 10/10/2015 12:36          DG Chest 2 View (Final result) Result time: 10/10/15 10:01:05   Final result by Rad Results In Interface (10/10/15 10:01:05)   Narrative:   CLINICAL DATA: Severe shortness of breath upon awakening this morning, getting worse, history diabetes mellitus, hypertension, CHF, coronary artery disease post stenting  EXAM: CHEST 2 VIEW  COMPARISON: 10/09/2015  FINDINGS: Enlargement of cardiac silhouette.  Mediastinal contours normal.  Accentuated perihilar markings and interstitial markings since previous exam, question mild pulmonary edema.  No segmental consolidation, pleural effusion or pneumothorax.  IMPRESSION: Enlargement of cardiac silhouette with new interstitial prominence question mild pulmonary edema.   Electronically Signed By: Lavonia Dana M.D. On: 10/10/2015 10:01           I personally reviewed the radiologic studies   ED Course: * Patient was given Lasix for what appeared to be some mild pulmonary edema and has had good urine output here in emergency department and while lying flat denies any shortness of breath. I don't see any new EKG findings and again his troponin is negative here in emergency department and sounds is always had thorough investigation from a cardiovascular standpoint. I reviewed the case with the on-call cardiologist as the patient did mention there was discussion about a heart catheterization. We felt the patient could be stably discharged to follow up with his regular cardiologist for further scheduling as an outpatient. I will increase his furosemide up to 40 mg a day.   Assessment:  Acute atypical chest pain History of acute coronary syndrome Pulmonary edema   Final Clinical Impression:   Final diagnoses:  Chest pain of uncertain etiology  Acute pulmonary edema (HCC)     Plan:  Outpatient Increase dosage on daily Lasix, continue all your current  medication Follow-up cardiology Patient was  advised to return immediately if condition worsens. Patient was advised to follow up with their primary care physician or other specialized physicians involved in their outpatient care. The patient and/or family member/power of attorney had laboratory results reviewed at the bedside. All questions and concerns were addressed and appropriate discharge instructions were distributed by the nursing staff.             Daymon Larsen, MD 10/10/15 [phone]

## 2015-10-10 NOTE — ED Notes (Addendum)
Pt reports shortness of breath and chest pain that became worse last night. Pt states he is due for cardiac cath this week but has not been set up yet.  Pt having trouble speaking without getting short of breath.  Pt was given a Duoneb treatment by EMS.

## 2015-10-10 NOTE — ED Notes (Signed)
Returned from XR 

## 2015-10-10 NOTE — ED Notes (Signed)
Patient transported to X-ray 

## 2015-10-11 ENCOUNTER — Emergency Department
Admission: EM | Admit: 2015-10-11 | Discharge: 2015-10-11 | Disposition: A | Discharge status: Home / Self Care | Payer: Medicare HMO | Attending: Emergency Medicine | Admitting: Emergency Medicine

## 2015-10-11 ENCOUNTER — Observation Stay
Admission: EM | Admit: 2015-10-11 | Discharge: 2015-10-12 | Disposition: A | Discharge status: Home / Self Care | Payer: Medicare HMO | Attending: Internal Medicine | Admitting: Internal Medicine

## 2015-10-11 ENCOUNTER — Encounter: Payer: Self-pay | Admitting: Emergency Medicine

## 2015-10-11 ENCOUNTER — Emergency Department: Payer: Medicare HMO

## 2015-10-11 ENCOUNTER — Other Ambulatory Visit: Payer: Self-pay

## 2015-10-11 DIAGNOSIS — E119 Type 2 diabetes mellitus without complications: Secondary | ICD-10-CM | POA: Diagnosis not present

## 2015-10-11 DIAGNOSIS — I110 Hypertensive heart disease with heart failure: Secondary | ICD-10-CM | POA: Insufficient documentation

## 2015-10-11 DIAGNOSIS — Z801 Family history of malignant neoplasm of trachea, bronchus and lung: Secondary | ICD-10-CM | POA: Diagnosis not present

## 2015-10-11 DIAGNOSIS — R7989 Other specified abnormal findings of blood chemistry: Secondary | ICD-10-CM | POA: Diagnosis present

## 2015-10-11 DIAGNOSIS — R0602 Shortness of breath: Secondary | ICD-10-CM

## 2015-10-11 DIAGNOSIS — Z7902 Long term (current) use of antithrombotics/antiplatelets: Secondary | ICD-10-CM | POA: Insufficient documentation

## 2015-10-11 DIAGNOSIS — Z8249 Family history of ischemic heart disease and other diseases of the circulatory system: Secondary | ICD-10-CM | POA: Diagnosis not present

## 2015-10-11 DIAGNOSIS — E785 Hyperlipidemia, unspecified: Secondary | ICD-10-CM | POA: Diagnosis not present

## 2015-10-11 DIAGNOSIS — Z9641 Presence of insulin pump (external) (internal): Secondary | ICD-10-CM | POA: Insufficient documentation

## 2015-10-11 DIAGNOSIS — E669 Obesity, unspecified: Secondary | ICD-10-CM | POA: Diagnosis not present

## 2015-10-11 DIAGNOSIS — Z791 Long term (current) use of non-steroidal anti-inflammatories (NSAID): Secondary | ICD-10-CM | POA: Diagnosis not present

## 2015-10-11 DIAGNOSIS — R748 Abnormal levels of other serum enzymes: Secondary | ICD-10-CM | POA: Diagnosis not present

## 2015-10-11 DIAGNOSIS — Z6838 Body mass index (BMI) 38.0-38.9, adult: Secondary | ICD-10-CM | POA: Diagnosis not present

## 2015-10-11 DIAGNOSIS — R778 Other specified abnormalities of plasma proteins: Secondary | ICD-10-CM | POA: Diagnosis present

## 2015-10-11 DIAGNOSIS — F603 Borderline personality disorder: Secondary | ICD-10-CM | POA: Insufficient documentation

## 2015-10-11 DIAGNOSIS — G4733 Obstructive sleep apnea (adult) (pediatric): Secondary | ICD-10-CM | POA: Diagnosis not present

## 2015-10-11 DIAGNOSIS — M109 Gout, unspecified: Secondary | ICD-10-CM | POA: Diagnosis not present

## 2015-10-11 DIAGNOSIS — I509 Heart failure, unspecified: Secondary | ICD-10-CM | POA: Diagnosis not present

## 2015-10-11 DIAGNOSIS — F319 Bipolar disorder, unspecified: Secondary | ICD-10-CM | POA: Insufficient documentation

## 2015-10-11 DIAGNOSIS — N400 Enlarged prostate without lower urinary tract symptoms: Secondary | ICD-10-CM | POA: Diagnosis not present

## 2015-10-11 DIAGNOSIS — I10 Essential (primary) hypertension: Secondary | ICD-10-CM | POA: Insufficient documentation

## 2015-10-11 DIAGNOSIS — E1151 Type 2 diabetes mellitus with diabetic peripheral angiopathy without gangrene: Secondary | ICD-10-CM | POA: Insufficient documentation

## 2015-10-11 DIAGNOSIS — Z794 Long term (current) use of insulin: Secondary | ICD-10-CM | POA: Insufficient documentation

## 2015-10-11 DIAGNOSIS — Z955 Presence of coronary angioplasty implant and graft: Secondary | ICD-10-CM | POA: Insufficient documentation

## 2015-10-11 DIAGNOSIS — R079 Chest pain, unspecified: Secondary | ICD-10-CM | POA: Diagnosis present

## 2015-10-11 DIAGNOSIS — Z9889 Other specified postprocedural states: Secondary | ICD-10-CM | POA: Diagnosis not present

## 2015-10-11 DIAGNOSIS — E039 Hypothyroidism, unspecified: Secondary | ICD-10-CM | POA: Insufficient documentation

## 2015-10-11 DIAGNOSIS — N529 Male erectile dysfunction, unspecified: Secondary | ICD-10-CM | POA: Insufficient documentation

## 2015-10-11 DIAGNOSIS — Z79899 Other long term (current) drug therapy: Secondary | ICD-10-CM | POA: Diagnosis not present

## 2015-10-11 DIAGNOSIS — I214 Non-ST elevation (NSTEMI) myocardial infarction: Secondary | ICD-10-CM | POA: Insufficient documentation

## 2015-10-11 DIAGNOSIS — Z818 Family history of other mental and behavioral disorders: Secondary | ICD-10-CM | POA: Diagnosis not present

## 2015-10-11 DIAGNOSIS — Z7982 Long term (current) use of aspirin: Secondary | ICD-10-CM | POA: Insufficient documentation

## 2015-10-11 DIAGNOSIS — E1142 Type 2 diabetes mellitus with diabetic polyneuropathy: Secondary | ICD-10-CM | POA: Insufficient documentation

## 2015-10-11 DIAGNOSIS — I25110 Atherosclerotic heart disease of native coronary artery with unstable angina pectoris: Secondary | ICD-10-CM | POA: Diagnosis not present

## 2015-10-11 DIAGNOSIS — G4700 Insomnia, unspecified: Secondary | ICD-10-CM | POA: Diagnosis not present

## 2015-10-11 DIAGNOSIS — R0789 Other chest pain: Secondary | ICD-10-CM | POA: Diagnosis not present

## 2015-10-11 DIAGNOSIS — K219 Gastro-esophageal reflux disease without esophagitis: Secondary | ICD-10-CM | POA: Diagnosis not present

## 2015-10-11 DIAGNOSIS — F419 Anxiety disorder, unspecified: Secondary | ICD-10-CM | POA: Insufficient documentation

## 2015-10-11 LAB — LIPID PANEL
Cholesterol: 176 mg/dL (ref 0–200)
HDL: 45 mg/dL (ref >40)
LDL CALC: 89 mg/dL (ref 0–99)
Total CHOL/HDL Ratio: 3.9 RATIO
Triglycerides: 211 mg/dL — ABNORMAL HIGH (ref <150)
VLDL: 42 mg/dL — AB (ref 0–40)

## 2015-10-11 LAB — CBC
HEMATOCRIT: 30.6 % — AB (ref 40.0–52.0)
HEMOGLOBIN: 10.2 g/dL — AB (ref 13.0–18.0)
MCH: 30.9 pg (ref 26.0–34.0)
MCHC: 33.5 g/dL (ref 32.0–36.0)
MCV: 92.1 fL (ref 80.0–100.0)
Platelets: 274 10*3/uL (ref 150–440)
RBC: 3.32 MIL/uL — ABNORMAL LOW (ref 4.40–5.90)
RDW: 16.2 % — AB (ref 11.5–14.5)
WBC: 6.9 10*3/uL (ref 3.8–10.6)

## 2015-10-11 LAB — TROPONIN I
Troponin I: 0.16 ng/mL — ABNORMAL HIGH (ref <0.031)
Troponin I: 0.13 ng/mL — ABNORMAL HIGH (ref <0.031)
Troponin I: 0.11 ng/mL — ABNORMAL HIGH (ref <0.031)
Troponin I: 0.1 ng/mL — ABNORMAL HIGH (ref <0.031)

## 2015-10-11 LAB — BASIC METABOLIC PANEL
Anion gap: 7 (ref 5–15)
BUN: 22 mg/dL — AB (ref 6–20)
CO2: 23 mmol/L (ref 22–32)
Calcium: 9.1 mg/dL (ref 8.9–10.3)
Chloride: 109 mmol/L (ref 101–111)
Creatinine, Ser: 1.01 mg/dL (ref 0.61–1.24)
GFR calc Af Amer: >60 mL/min (ref >60)
GLUCOSE: 109 mg/dL — AB (ref 65–99)
POTASSIUM: 4.5 mmol/L (ref 3.5–5.1)
Sodium: 139 mmol/L (ref 135–145)

## 2015-10-11 LAB — GLUCOSE, CAPILLARY
GLUCOSE-CAPILLARY: 292 mg/dL — AB (ref 65–99)
Glucose-Capillary: 124 mg/dL — ABNORMAL HIGH (ref 65–99)
Glucose-Capillary: 177 mg/dL — ABNORMAL HIGH (ref 65–99)

## 2015-10-11 LAB — BRAIN NATRIURETIC PEPTIDE: B NATRIURETIC PEPTIDE 5: 282 pg/mL — AB (ref 0.0–100.0)

## 2015-10-11 MED ORDER — GEMFIBROZIL 600 MG PO TABS
600.0000 mg | ORAL_TABLET | Freq: Two times a day (BID) | ORAL | Status: DC
Start: 2015-10-11 — End: 2015-10-11

## 2015-10-11 MED ORDER — SODIUM CHLORIDE 0.9% FLUSH
3.0000 mL | Freq: Two times a day (BID) | INTRAVENOUS | Status: DC
  Administered 2015-10-11 (×2): 3 mL via INTRAVENOUS

## 2015-10-11 MED ORDER — SODIUM CHLORIDE 0.9 % WEIGHT BASED INFUSION: 1.0000 mL/kg/h | INTRAVENOUS | Status: DC

## 2015-10-11 MED ORDER — PANTOPRAZOLE SODIUM 40 MG PO TBEC
40.0000 mg | DELAYED_RELEASE_TABLET | Freq: Every day | ORAL | Status: DC
  Administered 2015-10-11 – 2015-10-12 (×2): 40 mg via ORAL
  Filled 2015-10-11 (×2): qty 1

## 2015-10-11 MED ORDER — METOPROLOL SUCCINATE ER 50 MG PO TB24
50.0000 mg | ORAL_TABLET | Freq: Every day | ORAL | Status: DC
  Administered 2015-10-11 – 2015-10-12 (×2): 50 mg via ORAL
  Filled 2015-10-11 (×3): qty 1

## 2015-10-11 MED ORDER — AMLODIPINE BESYLATE 5 MG PO TABS
5.0000 mg | ORAL_TABLET | Freq: Every day | ORAL | Status: DC
  Administered 2015-10-11 – 2015-10-12 (×2): 5 mg via ORAL
  Filled 2015-10-11 (×2): qty 1

## 2015-10-11 MED ORDER — DIVALPROEX SODIUM ER 250 MG PO TB24
1000.0000 mg | ORAL_TABLET | Freq: Every day | ORAL | Status: DC
  Administered 2015-10-11: 1000 mg via ORAL
  Filled 2015-10-11: qty 4

## 2015-10-11 MED ORDER — LIRAGLUTIDE 18 MG/3ML ~~LOC~~ SOPN: 1.8000 mg | PEN_INJECTOR | Freq: Every day | SUBCUTANEOUS | Status: DC

## 2015-10-11 MED ORDER — NITROGLYCERIN 0.4 MG SL SUBL: 0.4000 mg | SUBLINGUAL_TABLET | SUBLINGUAL | Status: DC | PRN

## 2015-10-11 MED ORDER — PIOGLITAZONE HCL 15 MG PO TABS
15.0000 mg | ORAL_TABLET | Freq: Every morning | ORAL | Status: DC
  Administered 2015-10-11: 15 mg via ORAL
  Filled 2015-10-11: qty 1

## 2015-10-11 MED ORDER — DIVALPROEX SODIUM ER 250 MG PO TB24: 500.0000 mg | ORAL_TABLET | Freq: Two times a day (BID) | ORAL | Status: DC

## 2015-10-11 MED ORDER — METFORMIN HCL 500 MG PO TABS: 1000.0000 mg | ORAL_TABLET | Freq: Two times a day (BID) | ORAL | Status: DC

## 2015-10-11 MED ORDER — ASPIRIN EC 81 MG PO TBEC: 81.0000 mg | DELAYED_RELEASE_TABLET | Freq: Every morning | ORAL | Status: DC

## 2015-10-11 MED ORDER — ALLOPURINOL 100 MG PO TABS
100.0000 mg | ORAL_TABLET | Freq: Every morning | ORAL | Status: DC
  Administered 2015-10-11 – 2015-10-12 (×2): 100 mg via ORAL
  Filled 2015-10-11 (×2): qty 1

## 2015-10-11 MED ORDER — INSULIN ASPART 100 UNIT/ML ~~LOC~~ SOLN
0.0000 [IU] | Freq: Three times a day (TID) | SUBCUTANEOUS | Status: DC
  Administered 2015-10-11: 5 [IU] via SUBCUTANEOUS
  Administered 2015-10-12: 1 [IU] via SUBCUTANEOUS
  Filled 2015-10-11: qty 2
  Filled 2015-10-11: qty 5

## 2015-10-11 MED ORDER — ASPIRIN EC 325 MG PO TBEC
325.0000 mg | DELAYED_RELEASE_TABLET | Freq: Once | ORAL | Status: AC
  Administered 2015-10-11: 325 mg via ORAL
  Filled 2015-10-11: qty 1

## 2015-10-11 MED ORDER — SODIUM CHLORIDE 0.9 % WEIGHT BASED INFUSION
3.0000 mL/kg/h | INTRAVENOUS | Status: DC
  Administered 2015-10-12: 3 mL/kg/h via INTRAVENOUS

## 2015-10-11 MED ORDER — INSULIN GLARGINE 100 UNIT/ML ~~LOC~~ SOLN
20.0000 [IU] | Freq: Every day | SUBCUTANEOUS | Status: DC
  Administered 2015-10-12: 20 [IU] via SUBCUTANEOUS
  Filled 2015-10-11: qty 0.2

## 2015-10-11 MED ORDER — HEPARIN SODIUM (PORCINE) 5000 UNIT/ML IJ SOLN
5000.0000 [IU] | Freq: Three times a day (TID) | INTRAMUSCULAR | Status: DC
Start: 2015-10-11 — End: 2015-10-12
  Administered 2015-10-11: 5000 [IU] via SUBCUTANEOUS
  Filled 2015-10-11 (×2): qty 1

## 2015-10-11 MED ORDER — DIVALPROEX SODIUM ER 250 MG PO TB24
500.0000 mg | ORAL_TABLET | Freq: Every morning | ORAL | Status: DC
  Administered 2015-10-11 – 2015-10-12 (×2): 500 mg via ORAL
  Filled 2015-10-11 (×2): qty 2

## 2015-10-11 MED ORDER — SODIUM CHLORIDE 0.9% FLUSH
3.0000 mL | Freq: Two times a day (BID) | INTRAVENOUS | Status: DC
Start: 2015-10-11 — End: 2015-10-12

## 2015-10-11 MED ORDER — DICLOFENAC SODIUM 25 MG PO TBEC
75.0000 mg | DELAYED_RELEASE_TABLET | Freq: Two times a day (BID) | ORAL | Status: DC | PRN
  Filled 2015-10-11: qty 1

## 2015-10-11 MED ORDER — ROSUVASTATIN CALCIUM 10 MG PO TABS
5.0000 mg | ORAL_TABLET | Freq: Every day | ORAL | Status: DC
  Administered 2015-10-11: 5 mg via ORAL
  Filled 2015-10-11: qty 1

## 2015-10-11 MED ORDER — CLOPIDOGREL BISULFATE 75 MG PO TABS
75.0000 mg | ORAL_TABLET | Freq: Every morning | ORAL | Status: DC
  Administered 2015-10-11 – 2015-10-12 (×2): 75 mg via ORAL
  Filled 2015-10-11 (×2): qty 1

## 2015-10-11 MED ORDER — ASPIRIN 81 MG PO CHEW
81.0000 mg | CHEWABLE_TABLET | ORAL | Status: AC
  Administered 2015-10-12: 81 mg via ORAL
  Filled 2015-10-11: qty 1

## 2015-10-11 MED ORDER — LEVOTHYROXINE SODIUM 50 MCG PO TABS
50.0000 ug | ORAL_TABLET | Freq: Every day | ORAL | Status: DC
  Administered 2015-10-11 – 2015-10-12 (×2): 50 ug via ORAL
  Filled 2015-10-11 (×2): qty 1

## 2015-10-11 MED ORDER — SODIUM CHLORIDE 0.9 % IV SOLN: 250.0000 mL | INTRAVENOUS | Status: DC | PRN

## 2015-10-11 MED ORDER — PREGABALIN 50 MG PO CAPS
100.0000 mg | ORAL_CAPSULE | Freq: Three times a day (TID) | ORAL | Status: DC
  Administered 2015-10-11 – 2015-10-12 (×2): 100 mg via ORAL
  Filled 2015-10-11 (×2): qty 2

## 2015-10-11 MED ORDER — ZOLPIDEM TARTRATE 5 MG PO TABS
10.0000 mg | ORAL_TABLET | Freq: Every day | ORAL | Status: DC
  Administered 2015-10-11: 10 mg via ORAL
  Filled 2015-10-11: qty 2

## 2015-10-11 MED ORDER — FUROSEMIDE 40 MG PO TABS
40.0000 mg | ORAL_TABLET | Freq: Every day | ORAL | Status: DC
  Administered 2015-10-11 – 2015-10-12 (×2): 40 mg via ORAL
  Filled 2015-10-11 (×2): qty 1

## 2015-10-11 MED ORDER — COLCHICINE 0.6 MG PO TABS: 0.6000 mg | ORAL_TABLET | Freq: Two times a day (BID) | ORAL | Status: DC | PRN

## 2015-10-11 MED ORDER — DULOXETINE HCL 30 MG PO CPEP
60.0000 mg | ORAL_CAPSULE | Freq: Every day | ORAL | Status: DC
  Administered 2015-10-11 – 2015-10-12 (×2): 60 mg via ORAL
  Filled 2015-10-11 (×2): qty 2

## 2015-10-11 MED ORDER — SODIUM CHLORIDE 0.9% FLUSH
3.0000 mL | INTRAVENOUS | Status: DC | PRN
Start: 2015-10-11 — End: 2015-10-12

## 2015-10-11 MED ORDER — DIVALPROEX SODIUM ER 250 MG PO TB24: 500.0000 mg | ORAL_TABLET | Freq: Every morning | ORAL | Status: DC

## 2015-10-11 MED ORDER — ROPINIROLE HCL 1 MG PO TABS
1.0000 mg | ORAL_TABLET | Freq: Every day | ORAL | Status: DC
  Administered 2015-10-11: 1 mg via ORAL
  Filled 2015-10-11: qty 1

## 2015-10-11 MED ORDER — VITAMIN C 500 MG PO TABS
500.0000 mg | ORAL_TABLET | Freq: Every day | ORAL | Status: DC
  Administered 2015-10-11: 500 mg via ORAL
  Filled 2015-10-11: qty 1

## 2015-10-11 NOTE — ED Notes (Addendum)
Pt comes up to the desk inquiring "what do I need to do to get medical treatment?". Pt offered to check in to see a medical doctor and for medical evaluation...  Pt reports he has been here X 10 times in last 4 days. Pt reports that he walked out of his treatment room, removed his IV and left this morning 10/11/15 (approx 1.5 hours ago). Pt reports that he was "going to be admitted". Pt states his troponin levels were high and that he was waiting for a bed in the hospital for "3 hours". Pt appears irritated, asks if he "just needs to go outside and have a heart attack" in order to get medical assistance.   Pt again offered medical treatment.  Pt agrees to check back in at this time. Pt alert and oriented X4, active, cooperative, pt in NAD. RR even and unlabored, color WNL.

## 2015-10-11 NOTE — Progress Notes (Signed)
Patient admitted to unit. Oriented to room, call bell, and staff. Bed in lowest position. Fall safety plan reviewed. Full assessment to Epic. Skin assessment verified with ---Lurlean Horns RN--. Telemetry box verification with CCMD and Julisa NT Box#: ---ID:[id number]--. Will continue to monitor.

## 2015-10-11 NOTE — ED Provider Notes (Signed)
[facility] Emergency Department Provider Note  ____________________________________________  Time seen: Approximately 11:17 AM  I have reviewed the triage vital signs and the nursing notes.   HISTORY  Chief Complaint Shortness of Breath    HPI Dennis Mullen is a 65 y.o. male with a history of CAD s/p stents, CHF who was seen several hours ago and eloped after being admitted for a positive troponin and prior to complete evaluation by the hospitalist. The patient states he "got sick of waiting" and tried to go see Dr. [name] in the cardiology clinic but was unable to do so. He has not had any additional symptoms since that time. At this time, the patient is willing to stay for admission.   Past Medical History  Diagnosis Date  . Obesity   . Diabetes mellitus, type II ([place])   . Thyroid disease   . Anxiety   . Bipolar disorder ([place])   . History of borderline personality disorder   . Depression   . Neuropathy ([place])   . Diabetes mellitus, type II, insulin dependent ([place])   . Hypertension   . CHF (congestive heart failure) ([place])     ?  Marland Kitchen CAD (coronary artery disease)   . Gout   . Mood disorder ([place])   . Insomnia   . PVD (peripheral vascular disease) ([place])   . Abnormal levels of other serum enzymes   . GERD (gastroesophageal reflux disease)   . BPH (benign prostatic hyperplasia)   . ED (erectile dysfunction)   . Hypogonadism in male   . OSA (obstructive sleep apnea)     Patient Active Problem List   Diagnosis Date Noted  . SOB (shortness of breath) 10/11/2015  . Chest pain at rest 09/24/2015  . Elevated PSA 07/26/2015  . Hypogonadism in male 07/26/2015  . 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 BPH with obstruction/lower urinary tract symptoms 06/29/2015  . Bipolar 1 disorder, mixed ([place]) 04/14/2015  . Borderline personality disorder 04/14/2015  . Acute renal failure ([place]) 11/15/2014  . Chest pain 11/14/2014  . Dizziness  11/05/2014  . Benign essential HTN 11/04/2014  . Combined fat and carbohydrate induced hyperlipemia 11/04/2014  . Bipolar 2 disorder ([place]) 10/27/2014  . Acquired hypothyroidism 02/03/2014  . Type 2 diabetes mellitus ([place]) 02/03/2014  . Adiposity 01/06/2014  . Obesity, diabetes, and hypertension syndrome ([place]) 12/23/2013  . Long term current use of insulin ([place]) 12/23/2013  . Type 2 diabetes mellitus with other diabetic neurological complication ([place]) 76/22/6333  . Acute inflammation of the pancreas 11/18/2013  . Elevated cholesterol with elevated triglycerides 11/18/2013  . CAD in native artery 09/26/2013  . Diabetes mellitus ([place]) 09/26/2013  . Acute non-ST elevation myocardial infarction (NSTEMI) ([place]) 09/26/2013  . Non-ST elevation (NSTEMI) myocardial infarction [facility]) 09/26/2013    Past Surgical History  Procedure Laterality Date  . Tonsillectomy and adenoidectomy    . Nasal septum surgery    . Mastoidectomy Right   . Penial inplant    . Coronary angioplasty with stent placement      x5 stents  . Right mastoidectomy    . Penile prosthesis implant    . Foot surgery    . Scrotoplasty      Current Outpatient Rx  Name  Route  Sig  Dispense  Refill  . ACCU-CHEK SMARTVIEW test strip      Use as directed.           Dispense as written.   Mar[place] Kitchen allopurinol (ZYLOPRIM)  100 MG tablet   Oral   Take 100 mg by mouth every morning.         Marland Kitchen amLODipine (NORVASC) 5 MG tablet   Oral   Take 5 mg by mouth daily.          Marland Kitchen aspirin EC 81 MG tablet   Oral   Take 81 mg by mouth every morning. Reported on 07/26/2015         . clopidogrel (PLAVIX) 75 MG tablet   Oral   Take 75 mg by mouth every morning. Reported on 07/26/2015         . colchicine 0.6 MG tablet   Oral   Take 0.6 mg by mouth 2 (two) times daily as needed.         . diclofenac (VOLTAREN) 75 MG EC tablet   Oral   Take 75 mg by mouth 2 (two) times daily as needed for mild pain or moderate pain.           . divalproex (DEPAKOTE ER) 500 MG 24 hr tablet   Oral   Take 500-1,000 mg by mouth 2 (two) times daily. Take 562m in morning and 10054min evening         . DULoxetine (CYMBALTA) 60 MG capsule   Oral   Take 1 capsule (60 mg total) by mouth daily.   30 capsule   5   . esomeprazole (NEXIUM) 40 MG capsule   Oral   Take 40 mg by mouth daily at 12 noon.          . furosemide (LASIX) 40 MG tablet   Oral   Take 1 tablet (40 mg total) by mouth daily.   30 tablet   0   . gemfibrozil (LOPID) 600 MG tablet   Oral   Take 600 mg by mouth 2 (two) times daily before a meal.         . glucose 4 GM chewable tablet   Oral   Chew 1 tablet by mouth as needed for low blood sugar.         . insulin aspart (NOVOLOG) 100 UNIT/ML injection   Subcutaneous   Inject into the skin. Reported on 10/11/2015         . Insulin Disposable Pump (V-GO 30) KIT      Reported on 06/29/2015         . Insulin Pen Needle (NOVOFINE) 32G X 6 MM MISC      Reported on 07/26/2015         . levothyroxine (SYNTHROID, LEVOTHROID) 50 MCG tablet   Oral   Take 50 mcg by mouth daily before breakfast.         . metFORMIN (GLUCOPHAGE) 1000 MG tablet   Oral   Take 1,000 mg by mouth 2 (two) times daily with a meal.         . metoprolol succinate (TOPROL-XL) 50 MG 24 hr tablet   Oral   Take 50 mg by mouth daily.          . nitroGLYCERIN (NITROSTAT) 0.4 MG SL tablet   Sublingual   Place 0.4 mg under the tongue every 5 (five) minutes x 3 doses as needed for chest pain.          . pioglitazone (ACTOS) 15 MG tablet   Oral   Take 15 mg by mouth every morning.          . pregabalin (LYRICA) 100 MG capsule  Oral   Take 100 mg by mouth 3 (three) times daily.         Marland Kitchen rOPINIRole (REQUIP) 1 MG tablet   Oral   Take 1 mg by mouth at bedtime.          . rosuvastatin (CRESTOR) 5 MG tablet   Oral   Take 5 mg by mouth daily at 6 PM.          . VICTOZA 18 MG/3ML SOPN   Subcutaneous    Inject 1.8 mg into the skin daily.            Dispense as written.   . vitamin C (ASCORBIC ACID) 500 MG tablet   Oral   Take 500 mg by mouth daily as needed.         . zolpidem (AMBIEN) 10 MG tablet   Oral   Take 10 mg by mouth at bedtime. Reported on 07/26/2015           Allergies Review of patient's allergies indicates no known allergies.  Family History  Problem Relation Age of Onset  . Lung cancer Mother   . Heart attack Father   . Post-traumatic stress disorder Father   . Anxiety disorder Father   . Depression Father   . Heart attack Sister   . Heart attack Brother     Social History Social History  Substance Use Topics  . Smoking status: Never Smoker   . Smokeless tobacco: Never Used  . Alcohol Use: No    Review of Systems Constitutional: No fever/chills.No lightheadedness or syncope. Eyes: No visual changes. ENT: No sore throat. No congestion or rhinorrhea. Cardiovascular: Denies chest pain. Denies palpitations. Respiratory: Positive shortness of breath.  No cough. Gastrointestinal: No abdominal pain.  No nausea, no vomiting.  No diarrhea.  No constipation. Genitourinary: Negative for dysuria. Musculoskeletal: Negative for back pain. Skin: Negative for rash. Neurological: Negative for headaches. No focal numbness, tingling or weakness.   10-point ROS otherwise negative.  ____________________________________________   PHYSICAL EXAM:  VITAL SIGNS: ED Triage Vitals  Enc Vitals Group     BP 10/11/15 1047 161/60 mmHg     Pulse Rate 10/11/15 1047 72     Resp 10/11/15 1047 18     Temp 10/11/15 1047 98.2 F (36.8 C)     Temp Source 10/11/15 1047 Oral     SpO2 10/11/15 1047 100 %     Weight 10/11/15 1047 215 lb (97.523 kg)     Height --      Head Cir --      Peak Flow --      Pain Score 10/11/15 1047 0     Pain Loc --      Pain Edu? --      Excl. in [place]? --     Constitutional: Alert and oriented. Well appearing and in no acute distress.  Answers questions appropriately. Eyes: Conjunctivae are normal.  EOMI. No scleral icterus. Head: Atraumatic. Nose: No congestion/rhinnorhea. Mouth/Throat: Mucous membranes are moist.  Neck: No stridor.  Supple.   Cardiovascular: Normal rate, regular rhythm. Holosystolic murmur without rubs or gallops.  Respiratory: Normal respiratory effort.  No accessory muscle use or retractions. Lungs CTAB.  No wheezes, rales or ronchi. Gastrointestinal: Soft, nontender and nondistended.  No guarding or rebound.  No peritoneal signs. Musculoskeletal: No LE edema. No ttp in the calves or palpable cords.  Negative Homan's sign. Neurologic:  A&Ox3.  Speech is clear.  Face and smile are symmetric.  EOMI.  Moves all extremities well. Skin:  Skin is warm, dry and intact. No rash noted. Psychiatric: Mood and affect are normal. Speech and behavior are normal.  Normal judgement.  ____________________________________________   LABS (all labs ordered are listed, but only abnormal results are displayed)  Labs Reviewed  TROPONIN I  TROPONIN I  TROPONIN I   ____________________________________________  EKG  Not indicated as the patient has a prior EKG from today and no new symptoms. ____________________________________________  RADIOLOGY  Dg Chest 2 View  10/11/2015  CLINICAL DATA:  One day history of shortness of breath and cough; history of CHF and diabetes and coronary stent placement EXAM: CHEST  2 VIEW COMPARISON:  Chest x-ray of October 10, 2015 FINDINGS: The lungs are well-expanded. The interstitial markings are less conspicuous today. There is no alveolar infiltrate or pleural effusion. The heart is normal in size. There is mild central pulmonary vascular prominence which has improved. The trachea is midline. IMPRESSION: Improved appearance of the chest consistent with decreasing pulmonary interstitial edema. Electronically Signed   By: David  Martinique M.D.   On: 10/11/2015 08:22   Ct Angio Chest Pe W Or  Wo Contrast  10/10/2015  CLINICAL DATA:  Shortness of breath and chest pain. History of coronary artery disease with prior coronary stent pain. EXAM: CT ANGIOGRAPHY CHEST WITH CONTRAST TECHNIQUE: Multidetector CT imaging of the chest was performed using the standard protocol during bolus administration of intravenous contrast. Multiplanar CT image reconstructions and MIPs were obtained to evaluate the vascular anatomy. CONTRAST:  100 mL Isovue 370 IV COMPARISON:  Chest x-ray earlier today. FINDINGS: Vascular: The thoracic aorta and pulmonary arteries are well opacified. There is no evidence of pulmonary embolism. The thoracic aorta demonstrates no aneurysmal disease or dissection. Proximal great vessels are normally patent. Extensive calcified plaque is seen throughout the coronary tree in a 3 vessel distribution. The heart size is normal. There is some thinning of the myocardium at the left ventricular apex which could implicate prior infarct. The overall heart size appears normal. No pericardial fluid. There is extensive calcifications at the level of the aortic valve and aortic annulus. Mediastinum/Nodes: No mediastinal masses or lymphadenopathy identified. No evidence of hilar lymphadenopathy. Lungs/Pleura: Lungs show evidence of moderate pulmonary edema throughout all lobes with ground-glass edema present. Associated trace amount of pleural fluid bilaterally. No infiltrates, pneumothorax or pulmonary nodules. Upper abdomen: Extensively calcified splenic artery. Visualized upper abdominal aorta is of normal caliber. Separate origins of a left gastric artery, celiac axis, superior mesenteric artery and bilateral single renal arteries demonstrate no significant stenosis. Musculoskeletal: No bony abnormalities identified. Review of the MIP images confirms the above findings. IMPRESSION: 1. No evidence of pulmonary embolism or acute aortic pathology. 2. Coronary atherosclerosis with extensive calcified plaque seen  in a 3 vessel distribution. Thinning of the myocardium at the left ventricular apex could implicate prior infarct. 3. Extensive aortic valvular and annular calcifications. 4. Moderate pulmonary edema with trace bilateral pleural effusions. Electronically Signed   By: Aletta Edouard M.D.   On: 10/10/2015 12:36    ____________________________________________   PROCEDURES  Procedure(s) performed: None  Critical Care performed: No ____________________________________________   INITIAL IMPRESSION / ASSESSMENT AND PLAN / ED COURSE  Pertinent labs & imaging results that were available during my care of the patient were reviewed by me and considered in my medical decision making (see chart for details).  65 y.o. with a history of CAD and a positive troponin this morning who left prior to  treatment being completed and is now back willing to complete his admission.  ____________________________________________  FINAL CLINICAL IMPRESSION(S) / ED DIAGNOSES  Final diagnoses:  NSTEMI (non-ST elevated myocardial infarction) ([place])      NEW MEDICATIONS STARTED DURING THIS VISIT:  New Prescriptions   No medications on file     Eula Listen, MD 10/11/15 1119

## 2015-10-11 NOTE — ED Notes (Signed)
Patient reports having short of breath.

## 2015-10-11 NOTE — ED Provider Notes (Addendum)
[facility] Emergency Department Provider Note  ____________________________________________  Time seen: Approximately 8:24 AM  I have reviewed the triage vital signs and the nursing notes.   HISTORY  Chief Complaint Shortness of Breath    HPI Dennis Mullen is a 65 y.o. male with a history of CAD s/p stent 5,HTN, DM 2 presenting with shortness of breath. The patient reports 2 weeks of intermittent shortness of breath. 2 weeks ago he also had a single episode of acute central chest pain which has not recurred. Last night, the patient woke up at 3:30 AM with a "wheezy" sensation in the center of the chest associated with shortness of breath. He was evaluated yesterday for shortness of breath including reassuring EKG, negative troponin, and CT of the chest which would did not show PE although it did show diffuse coronary artery lesions. The patient denies that he has had any chest pain, tightness or pressure, lower extremity swelling, or infectious symptoms such as fever, chills, cough or cold symptoms.  SH: Denies tobacco or cocaine   Past Medical History  Diagnosis Date  . Obesity   . Diabetes mellitus, type II ([place])   . Thyroid disease   . Anxiety   . Bipolar disorder ([place])   . History of borderline personality disorder   . Depression   . Neuropathy ([place])   . Diabetes mellitus, type II, insulin dependent ([place])   . Hypertension   . CHF (congestive heart failure) ([place])     ?  Marland Kitchen CAD (coronary artery disease)   . Gout   . Mood disorder ([place])   . Insomnia   . PVD (peripheral vascular disease) ([place])   . Abnormal levels of other serum enzymes   . GERD (gastroesophageal reflux disease)   . BPH (benign prostatic hyperplasia)   . ED (erectile dysfunction)   . Hypogonadism in male   . OSA (obstructive sleep apnea)     Patient Active Problem List   Diagnosis Date Noted  . Chest pain at rest 09/24/2015  . Elevated PSA 07/26/2015  . Hypogonadism in  male 07/26/2015  . 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 BPH with obstruction/lower urinary tract symptoms 06/29/2015  . Bipolar 1 disorder, mixed ([place]) 04/14/2015  . Borderline personality disorder 04/14/2015  . Acute renal failure ([place]) 11/15/2014  . Chest pain 11/14/2014  . Dizziness 11/05/2014  . Benign essential HTN 11/04/2014  . Combined fat and carbohydrate induced hyperlipemia 11/04/2014  . Bipolar 2 disorder ([place]) 10/27/2014  . Acquired hypothyroidism 02/03/2014  . Type 2 diabetes mellitus ([place]) 02/03/2014  . Adiposity 01/06/2014  . Obesity, diabetes, and hypertension syndrome ([employer]) 12/23/2013  . Long term current use of insulin ([employer]) 12/23/2013  . Type 2 diabetes mellitus with other diabetic neurological complication ([place]) 22/05/5425  . Acute inflammation of the pancreas 11/18/2013  . Elevated cholesterol with elevated triglycerides 11/18/2013  . CAD in native artery 09/26/2013  . Diabetes mellitus ([place]) 09/26/2013  . Acute non-ST elevation myocardial infarction (NSTEMI) ([place]) 09/26/2013  . Non-ST elevation (NSTEMI) myocardial infarction [facility]) 09/26/2013    Past Surgical History  Procedure Laterality Date  . Tonsillectomy and adenoidectomy    . Nasal septum surgery    . Mastoidectomy Right   . Penial inplant    . Coronary angioplasty with stent placement      x5 stents  . Right mastoidectomy    . Penile prosthesis implant    . Foot surgery    . Scrotoplasty  Current Outpatient Rx  Name  Route  Sig  Dispense  Refill  . ACCU-CHEK SMARTVIEW test strip      Use as directed.           Dispense as written.   Marland Kitchen allopurinol (ZYLOPRIM) 100 MG tablet   Oral   Take 100 mg by mouth every morning.         Marland Kitchen amLODipine (NORVASC) 5 MG tablet   Oral   Take 2.5 mg by mouth daily.          Marland Kitchen aspirin EC 81 MG tablet   Oral   Take 81 mg by mouth every morning. Reported on 07/26/2015         . clopidogrel (PLAVIX) 75 MG tablet   Oral   Take  75 mg by mouth every morning. Reported on 07/26/2015         . colchicine 0.6 MG tablet   Oral   Take 0.6 mg by mouth 2 (two) times daily as needed.         . diclofenac (VOLTAREN) 75 MG EC tablet   Oral   Take 75 mg by mouth 2 (two) times daily as needed for mild pain or moderate pain.          . divalproex (DEPAKOTE ER) 500 MG 24 hr tablet   Oral   Take 1 tablet (500 mg total) by mouth 3 (three) times daily between meals. Patient taking differently: Take 500-1,000 mg by mouth 2 (two) times daily. 500 mg in the morning, 1000 mg qhs   90 tablet   5   . DULoxetine (CYMBALTA) 60 MG capsule   Oral   Take 1 capsule (60 mg total) by mouth daily.   30 capsule   5   . esomeprazole (NEXIUM) 40 MG capsule   Oral   Take 40 mg by mouth daily at 12 noon.          . furosemide (LASIX) 40 MG tablet   Oral   Take 1 tablet (40 mg total) by mouth daily.   30 tablet   0   . gemfibrozil (LOPID) 600 MG tablet   Oral   Take 600 mg by mouth 2 (two) times daily before a meal.         . glucose 4 GM chewable tablet   Oral   Chew 1 tablet by mouth as needed for low blood sugar.         . insulin aspart (NOVOLOG) 100 UNIT/ML injection   Subcutaneous   Inject into the skin. Take up to 66 units dialy as directed via VGO kit which equals to 0.8 units per hour with  Boluses as needed (up to 12 units at meals)         . Insulin Disposable Pump (V-GO 30) KIT      Reported on 06/29/2015         . Insulin Pen Needle (NOVOFINE) 32G X 6 MM MISC      Reported on 07/26/2015         . levothyroxine (SYNTHROID, LEVOTHROID) 50 MCG tablet   Oral   Take 50 mcg by mouth daily before breakfast.         . losartan-hydrochlorothiazide (HYZAAR) 100-25 MG tablet   Oral   Take 1 tablet by mouth daily.         . metFORMIN (GLUCOPHAGE) 1000 MG tablet   Oral   Take 1,000 mg by mouth 2 (two) times daily  with a meal.         . metoprolol succinate (TOPROL-XL) 50 MG 24 hr tablet    Oral   Take 50 mg by mouth daily.          . nitroGLYCERIN (NITROSTAT) 0.4 MG SL tablet   Sublingual   Place 0.4 mg under the tongue every 5 (five) minutes x 3 doses as needed for chest pain.          . pioglitazone (ACTOS) 15 MG tablet   Oral   Take 15 mg by mouth every morning.          . pregabalin (LYRICA) 100 MG capsule   Oral   Take 100 mg by mouth 3 (three) times daily.         Marland Kitchen rOPINIRole (REQUIP) 1 MG tablet   Oral   Take 1 mg by mouth at bedtime.          . rosuvastatin (CRESTOR) 5 MG tablet   Oral   Take 5 mg by mouth daily at 6 PM.          . VICTOZA 18 MG/3ML SOPN   Subcutaneous   Inject 1.8 mg into the skin daily.            Dispense as written.   . vitamin C (ASCORBIC ACID) 500 MG tablet   Oral   Take 500 mg by mouth daily as needed.         . zolpidem (AMBIEN) 10 MG tablet   Oral   Take 10 mg by mouth at bedtime. Reported on 07/26/2015           Allergies Review of patient's allergies indicates no known allergies.  Family History  Problem Relation Age of Onset  . Lung cancer Mother   . Heart attack Father   . Post-traumatic stress disorder Father   . Anxiety disorder Father   . Depression Father   . Heart attack Sister   . Heart attack Brother     Social History Social History  Substance Use Topics  . Smoking status: Never Smoker   . Smokeless tobacco: Never Used  . Alcohol Use: No    Review of Systems Constitutional: No fever/chills.No lightheadedness or syncope. Eyes: No visual changes. ENT: No sore throat. No congestion or rhinorrhea. Cardiovascular: Denies chest pain. Denies palpitations. Respiratory: Positive shortness of breath.  No cough. Gastrointestinal: No abdominal pain.  No nausea, no vomiting.  No diarrhea.  No constipation. Genitourinary: Negative for dysuria. Musculoskeletal: Negative for back pain. Negative lower extremity swelling. Negative calf pain. Skin: Negative for rash. Neurological:  Negative for headaches. No focal numbness, tingling or weakness.   10-point ROS otherwise negative.  ____________________________________________   PHYSICAL EXAM:  VITAL SIGNS: ED Triage Vitals  Enc Vitals Group     BP 10/11/15 0703 145/52 mmHg     Pulse Rate 10/11/15 0703 71     Resp 10/11/15 0703 16     Temp 10/11/15 0703 98 F (36.7 C)     Temp Source 10/11/15 0703 Oral     SpO2 --      Weight 10/11/15 0703 215 lb (97.523 kg)     Height 10/11/15 0703 6' 2"  (1.88 m)     Head Cir --      Peak Flow --      Pain Score --      Pain Loc --      Pain Edu? --      Excl. in  GC? --     Constitutional: Alert and oriented. Well appearing and in no acute distress. Answers questions appropriately.Patient sits and stands comfortably in the room. Eyes: Conjunctivae are normal.  EOMI. No scleral icterus. Head: Atraumatic. Nose: No congestion/rhinnorhea. Mouth/Throat: Mucous membranes are moist.  Neck: No stridor.  Supple.  No JVD. Cardiovascular: Normal rate, regular rhythm. Holosystolic murmurs without rubs or gallops.  Respiratory: Normal respiratory effort.  No accessory muscle use or retractions. Lungs CTAB.  No wheezes, rales or ronchi. Gastrointestinal: Soft, nontender and nondistended.  No guarding or rebound.  No peritoneal signs. Musculoskeletal: No LE edema. No ttp in the calves or palpable cords.  Negative Homan's sign. Neurologic:  A&Ox3.  Speech is clear.  Face and smile are symmetric.  EOMI.  Moves all extremities well. Skin:  Skin is warm, dry and intact. No rash noted. Psychiatric: Mood and affect are normal. Speech and behavior are normal.  Normal judgement.  ____________________________________________   LABS (all labs ordered are listed, but only abnormal results are displayed)  Labs Reviewed  BASIC METABOLIC PANEL - Abnormal; Notable for the following:    Glucose, Bld 109 (*)    BUN 22 (*)    All other components within normal limits  CBC - Abnormal; Notable  for the following:    RBC 3.32 (*)    Hemoglobin 10.2 (*)    HCT 30.6 (*)    RDW 16.2 (*)    All other components within normal limits  TROPONIN I - Abnormal; Notable for the following:    Troponin I 0.16 (*)    All other components within normal limits  BRAIN NATRIURETIC PEPTIDE   ____________________________________________  EKG  ED ECG REPORT I, Eula Listen, the attending physician, personally viewed and interpreted this ECG.   Date: 10/11/2015  EKG Time: 702  Rate: 71  Rhythm: normal sinus rhythm  Axis: Normal  Intervals:none  ST&T Change: No STEMI  ____________________________________________  RADIOLOGY  Dg Chest 2 View  10/11/2015  CLINICAL DATA:  One day history of shortness of breath and cough; history of CHF and diabetes and coronary stent placement EXAM: CHEST  2 VIEW COMPARISON:  Chest x-ray of October 10, 2015 FINDINGS: The lungs are well-expanded. The interstitial markings are less conspicuous today. There is no alveolar infiltrate or pleural effusion. The heart is normal in size. There is mild central pulmonary vascular prominence which has improved. The trachea is midline. IMPRESSION: Improved appearance of the chest consistent with decreasing pulmonary interstitial edema. Electronically Signed   By: David  Martinique M.D.   On: 10/11/2015 08:22   Dg Chest 2 View  10/10/2015  CLINICAL DATA:  Severe shortness of breath upon awakening this morning, getting worse, history diabetes mellitus, hypertension, CHF, coronary artery disease post stenting EXAM: CHEST  2 VIEW COMPARISON:  10/09/2015 FINDINGS: Enlargement of cardiac silhouette. Mediastinal contours normal. Accentuated perihilar markings and interstitial markings since previous exam, question mild pulmonary edema. No segmental consolidation, pleural effusion or pneumothorax. IMPRESSION: Enlargement of cardiac silhouette with new interstitial prominence question mild pulmonary edema. Electronically Signed   By:  Lavonia Dana M.D.   On: 10/10/2015 10:01   Ct Angio Chest Pe W Or Wo Contrast  10/10/2015  CLINICAL DATA:  Shortness of breath and chest pain. History of coronary artery disease with prior coronary stent pain. EXAM: CT ANGIOGRAPHY CHEST WITH CONTRAST TECHNIQUE: Multidetector CT imaging of the chest was performed using the standard protocol during bolus administration of intravenous contrast. Multiplanar CT image reconstructions and  MIPs were obtained to evaluate the vascular anatomy. CONTRAST:  100 mL Isovue 370 IV COMPARISON:  Chest x-ray earlier today. FINDINGS: Vascular: The thoracic aorta and pulmonary arteries are well opacified. There is no evidence of pulmonary embolism. The thoracic aorta demonstrates no aneurysmal disease or dissection. Proximal great vessels are normally patent. Extensive calcified plaque is seen throughout the coronary tree in a 3 vessel distribution. The heart size is normal. There is some thinning of the myocardium at the left ventricular apex which could implicate prior infarct. The overall heart size appears normal. No pericardial fluid. There is extensive calcifications at the level of the aortic valve and aortic annulus. Mediastinum/Nodes: No mediastinal masses or lymphadenopathy identified. No evidence of hilar lymphadenopathy. Lungs/Pleura: Lungs show evidence of moderate pulmonary edema throughout all lobes with ground-glass edema present. Associated trace amount of pleural fluid bilaterally. No infiltrates, pneumothorax or pulmonary nodules. Upper abdomen: Extensively calcified splenic artery. Visualized upper abdominal aorta is of normal caliber. Separate origins of a left gastric artery, celiac axis, superior mesenteric artery and bilateral single renal arteries demonstrate no significant stenosis. Musculoskeletal: No bony abnormalities identified. Review of the MIP images confirms the above findings. IMPRESSION: 1. No evidence of pulmonary embolism or acute aortic  pathology. 2. Coronary atherosclerosis with extensive calcified plaque seen in a 3 vessel distribution. Thinning of the myocardium at the left ventricular apex could implicate prior infarct. 3. Extensive aortic valvular and annular calcifications. 4. Moderate pulmonary edema with trace bilateral pleural effusions. Electronically Signed   By: Aletta Edouard M.D.   On: 10/10/2015 12:36    ____________________________________________   PROCEDURES  Procedure(s) performed: None  Critical Care performed: No ____________________________________________   INITIAL IMPRESSION / ASSESSMENT AND PLAN / ED COURSE  Pertinent labs & imaging results that were available during my care of the patient were reviewed by me and considered in my medical decision making (see chart for details).  65 y.o. male with a history of CAD status post stents presenting with shortness of breath. Overall, the patient is nontoxic in appearance, however he does have several weeks of progressively worsening symptoms and today his troponin is positive. He has not taken any medications over the last 2 days, so treated with him with an aspirin. He does not have any acute chest discomfort, heparin is not indicated at this time. At this time, the patient does not have any ischemic changes on his EKG, but he will require further monitoring for his positive troponin. I am concerned about unstable angina in this patient with known multivessel disease. The patient will be admitted to the hospital for further evaluation and treatment.  ____________________________________________  FINAL CLINICAL IMPRESSION(S) / ED DIAGNOSES  Final diagnoses:  NSTEMI (non-ST elevated myocardial infarction) ([place])      NEW MEDICATIONS STARTED DURING THIS VISIT:  New Prescriptions   No medications on file     Eula Listen, MD 10/11/15 0830  Eula Listen, MD 10/11/15 [phone]

## 2015-10-11 NOTE — H&P (Signed)
[facility] NAME: Dennis Mullen    MR#:  [id number]  DATE OF BIRTH:  May 21, 1950  DATE OF ADMISSION:  10/11/2015  PRIMARY CARE PHYSICIAN: Christie Nottingham., PA   REQUESTING/REFERRING PHYSICIAN:   CHIEF COMPLAINT:   Chief Complaint  Patient presents with  . Shortness of Breath    HISTORY OF PRESENT ILLNESS: Dennis Mullen  is a 65 y.o. male with a known history of Type 2 diabetes mellitus, obesity, hypothyroidism, bipolar disorder, neuropathy, hypertension, coronary artery disease with cardiac stent presented to the emergency room with chest pain. He had admission 2 weeks ago and negative stress test here. He visited ER in last 3-4 days almost everyday for chest pain, CT chest negative for PE. Today he came with SOB, troponin is slightly elevated for the first time, so given as admission.  PAST MEDICAL HISTORY:   Past Medical History  Diagnosis Date  . Obesity   . Diabetes mellitus, type II ([place])   . Thyroid disease   . Anxiety   . Bipolar disorder ([place])   . History of borderline personality disorder   . Depression   . Neuropathy ([place])   . Diabetes mellitus, type II, insulin dependent ([place])   . Hypertension   . CHF (congestive heart failure) ([place])     ?  Marland Kitchen CAD (coronary artery disease)   . Gout   . Mood disorder ([place])   . Insomnia   . PVD (peripheral vascular disease) ([place])   . Abnormal levels of other serum enzymes   . GERD (gastroesophageal reflux disease)   . BPH (benign prostatic hyperplasia)   . ED (erectile dysfunction)   . Hypogonadism in male   . OSA (obstructive sleep apnea)     PAST SURGICAL HISTORY: Past Surgical History  Procedure Laterality Date  . Tonsillectomy and adenoidectomy    . Nasal septum surgery    . Mastoidectomy Right   . Penial inplant    . Coronary angioplasty with stent placement      x5 stents  . Right mastoidectomy    . Penile prosthesis implant    . Foot surgery    . Scrotoplasty       SOCIAL HISTORY:  Social History  Substance Use Topics  . Smoking status: Never Smoker   . Smokeless tobacco: Never Used  . Alcohol Use: No    FAMILY HISTORY:  Family History  Problem Relation Age of Onset  . Lung cancer Mother   . Heart attack Father   . Post-traumatic stress disorder Father   . Anxiety disorder Father   . Depression Father   . Heart attack Sister   . Heart attack Brother     DRUG ALLERGIES: No Known Allergies  REVIEW OF SYSTEMS:   CONSTITUTIONAL: No fever, fatigue or weakness.  EYES: No blurred or double vision.  EARS, NOSE, AND THROAT: No tinnitus or ear pain.  RESPIRATORY: No cough, shortness of breath, wheezing or hemoptysis.  CARDIOVASCULAR: positive chest pain, orthopnea, edema.  GASTROINTESTINAL: No nausea, vomiting, diarrhea or abdominal pain.  GENITOURINARY: No dysuria, hematuria.  ENDOCRINE: No polyuria, nocturia,  HEMATOLOGY: No anemia, easy bruising or bleeding SKIN: No rash or lesion. MUSCULOSKELETAL: No joint pain or arthritis.   NEUROLOGIC: No tingling, numbness, weakness.  PSYCHIATRY: No anxiety or depression.   MEDICATIONS AT HOME:  Prior to Admission medications   Medication Sig Start Date End Date Taking? Authorizing Provider  ACCU-CHEK SMARTVIEW test strip Use as directed.  10/15/14  Yes Historical Provider, MD  allopurinol (ZYLOPRIM) 100 MG tablet Take 100 mg by mouth every morning.   Yes Historical Provider, MD  amLODipine (NORVASC) 5 MG tablet Take 5 mg by mouth daily.  10/14/14  Yes Historical Provider, MD  clopidogrel (PLAVIX) 75 MG tablet Take 75 mg by mouth every morning. Reported on 07/26/2015   Yes Historical Provider, MD  colchicine 0.6 MG tablet Take 0.6 mg by mouth 2 (two) times daily as needed.   Yes Historical Provider, MD  diclofenac (VOLTAREN) 75 MG EC tablet Take 75 mg by mouth 2 (two) times daily as needed for mild pain or moderate pain.    Yes Historical Provider, MD  divalproex (DEPAKOTE ER) 500 MG 24 hr  tablet Take 500-1,000 mg by mouth 2 (two) times daily. Take 569m in morning and 10080min evening   Yes Historical Provider, MD  DULoxetine (CYMBALTA) 60 MG capsule Take 1 capsule (60 mg total) by mouth daily. 04/27/15  Yes JoGonzella LexMD  esomeprazole (NEXIUM) 40 MG capsule Take 40 mg by mouth daily at 12 noon.  10/14/14  Yes Historical Provider, MD  furosemide (LASIX) 40 MG tablet Take 1 tablet (40 mg total) by mouth daily. 09/25/15  Yes Richard WiLeslye PeerMD  gemfibrozil (LOPID) 600 MG tablet Take 600 mg by mouth 2 (two) times daily before a meal.   Yes Historical Provider, MD  glucose 4 GM chewable tablet Chew 1 tablet by mouth as needed for low blood sugar.   Yes Historical Provider, MD  insulin aspart (NOVOLOG) 100 UNIT/ML injection Inject into the skin. Reported on 10/11/2015   Yes Historical Provider, MD  Insulin Disposable Pump (V-GO 30) KIT Reported on 06/29/2015 10/07/14  Yes Historical Provider, MD  Insulin Pen Needle (NOVOFINE) 32G X 6 MM MISC Reported on 07/26/2015 11/07/13  Yes Historical Provider, MD  levothyroxine (SYNTHROID, LEVOTHROID) 50 MCG tablet Take 50 mcg by mouth daily before breakfast.   Yes Historical Provider, MD  metFORMIN (GLUCOPHAGE) 1000 MG tablet Take 1,000 mg by mouth 2 (two) times daily with a meal.   Yes Historical Provider, MD  metoprolol succinate (TOPROL-XL) 50 MG 24 hr tablet Take 50 mg by mouth daily.  09/11/14  Yes Historical Provider, MD  nitroGLYCERIN (NITROSTAT) 0.4 MG SL tablet Place 0.4 mg under the tongue every 5 (five) minutes x 3 doses as needed for chest pain.    Yes Historical Provider, MD  pioglitazone (ACTOS) 15 MG tablet Take 15 mg by mouth every morning.    Yes Historical Provider, MD  pregabalin (LYRICA) 100 MG capsule Take 100 mg by mouth 3 (three) times daily.   Yes Historical Provider, MD  rOPINIRole (REQUIP) 1 MG tablet Take 1 mg by mouth at bedtime.  04/27/15  Yes Historical Provider, MD  rosuvastatin (CRESTOR) 5 MG tablet Take 5 mg by mouth  daily at 6 PM.  07/05/15  Yes Historical Provider, MD  VICTOZA 18 MG/3ML SOPN Inject 1.8 mg into the skin daily.  09/07/14  Yes Historical Provider, MD  vitamin C (ASCORBIC ACID) 500 MG tablet Take 500 mg by mouth daily as needed.   Yes Historical Provider, MD  zolpidem (AMBIEN) 10 MG tablet Take 10 mg by mouth at bedtime. Reported on 07/26/2015 05/20/15  Yes Historical Provider, MD  aspirin EC 81 MG tablet Take 81 mg by mouth every morning. Reported on 07/26/2015    Historical Provider, MD      PHYSICAL EXAMINATION:   VITAL SIGNS: Blood pressure  150/67, pulse 72, temperature 98.2 F (36.8 C), temperature source Oral, resp. rate 20, height 6' 2"  (1.88 m), weight 135.036 kg (297 lb 11.2 oz), SpO2 98 %.  GENERAL:  65 y.o.-year-old patient lying in the bed with no acute distress.  EYES: Pupils equal, round, reactive to light and accommodation. No scleral icterus. Extraocular muscles intact.  HEENT: Head atraumatic, normocephalic. Oropharynx and nasopharynx clear.  NECK:  Supple, no jugular venous distention. No thyroid enlargement, no tenderness.  LUNGS: Normal breath sounds bilaterally, no wheezing, rales,rhonchi or crepitation. No use of accessory muscles of respiration.  CARDIOVASCULAR: S1, S2 normal. No murmurs, rubs, or gallops.  ABDOMEN: Soft, nontender, nondistended. Bowel sounds present. No organomegaly or mass.  EXTREMITIES: No pedal edema, cyanosis, or clubbing.  NEUROLOGIC: Cranial nerves II through XII are intact. Muscle strength 5/5 in all extremities. Sensation intact. Gait not checked.  PSYCHIATRIC: The patient is alert and oriented x 3.  SKIN: No obvious rash, lesion, or ulcer.   LABORATORY PANEL:   CBC  Recent Labs Lab 10/09/15 0030 10/10/15 0923 10/11/15 0708  WBC 6.7 7.7 6.9  HGB 10.4* 10.4* 10.2*  HCT 31.5* 32.0* 30.6*  PLT 285 271 274  MCV 93.0 93.3 92.1  MCH 30.8 30.3 30.9  MCHC 33.1 32.5 33.5  RDW 16.5* 16.9* 16.2*    ------------------------------------------------------------------------------------------------------------------  Chemistries   Recent Labs Lab 10/09/15 0030 10/10/15 0923 10/11/15 0708  NA 137 137 139  K 5.3* 5.6* 4.5  CL 105 107 109  CO2 22 24 23   GLUCOSE 236* 354* 109*  BUN 30* 23* 22*  CREATININE 1.46* 1.26* 1.01  CALCIUM 9.1 8.8* 9.1   ------------------------------------------------------------------------------------------------------------------ estimated creatinine clearance is 110.8 mL/min (by C-G formula based on Cr of 1.01). ------------------------------------------------------------------------------------------------------------------ No results for input(s): TSH, T4TOTAL, T3FREE, THYROIDAB in the last 72 hours.  Invalid input(s): FREET3   Coagulation profile No results for input(s): INR, PROTIME in the last 168 hours. ------------------------------------------------------------------------------------------------------------------- No results for input(s): DDIMER in the last 72 hours. -------------------------------------------------------------------------------------------------------------------  Cardiac Enzymes  Recent Labs Lab 10/10/15 0923 10/11/15 0708 10/11/15 1139  TROPONINI 0.03 0.16* 0.13*   ------------------------------------------------------------------------------------------------------------------ Invalid input(s): POCBNP  ---------------------------------------------------------------------------------------------------------------  Urinalysis    Component Value Date/Time   COLORURINE YELLOW* 11/14/2014 1415   COLORURINE Yellow 01/26/2014 0759   APPEARANCEUR CLEAR* 11/14/2014 1415   APPEARANCEUR Clear 01/26/2014 0759   LABSPEC 1.013 11/14/2014 1415   LABSPEC 1.021 01/26/2014 0759   PHURINE 5.0 11/14/2014 1415   PHURINE 5.0 01/26/2014 0759   GLUCOSEU NEGATIVE 11/14/2014 1415   GLUCOSEU >=500 01/26/2014 0759   HGBUR  NEGATIVE 11/14/2014 1415   HGBUR Negative 01/26/2014 0759   BILIRUBINUR NEGATIVE 11/14/2014 1415   BILIRUBINUR Negative 01/26/2014 0759   KETONESUR NEGATIVE 11/14/2014 1415   KETONESUR Negative 01/26/2014 0759   PROTEINUR NEGATIVE 11/14/2014 1415   PROTEINUR Negative 01/26/2014 0759   NITRITE NEGATIVE 11/14/2014 1415   NITRITE Negative 01/26/2014 0759   LEUKOCYTESUR NEGATIVE 11/14/2014 1415   LEUKOCYTESUR Negative 01/26/2014 0759     RADIOLOGY: Dg Chest 2 View  10/11/2015  CLINICAL DATA:  One day history of shortness of breath and cough; history of CHF and diabetes and coronary stent placement EXAM: CHEST  2 VIEW COMPARISON:  Chest x-ray of October 10, 2015 FINDINGS: The lungs are well-expanded. The interstitial markings are less conspicuous today. There is no alveolar infiltrate or pleural effusion. The heart is normal in size. There is mild central pulmonary vascular prominence which has improved. The trachea is midline. IMPRESSION: Improved appearance of  the chest consistent with decreasing pulmonary interstitial edema. Electronically Signed   By: David  Martinique M.D.   On: 10/11/2015 08:22   Dg Chest 2 View  10/10/2015  CLINICAL DATA:  Severe shortness of breath upon awakening this morning, getting worse, history diabetes mellitus, hypertension, CHF, coronary artery disease post stenting EXAM: CHEST  2 VIEW COMPARISON:  10/09/2015 FINDINGS: Enlargement of cardiac silhouette. Mediastinal contours normal. Accentuated perihilar markings and interstitial markings since previous exam, question mild pulmonary edema. No segmental consolidation, pleural effusion or pneumothorax. IMPRESSION: Enlargement of cardiac silhouette with new interstitial prominence question mild pulmonary edema. Electronically Signed   By: Lavonia Dana M.D.   On: 10/10/2015 10:01   Ct Angio Chest Pe W Or Wo Contrast  10/10/2015  CLINICAL DATA:  Shortness of breath and chest pain. History of coronary artery disease with prior  coronary stent pain. EXAM: CT ANGIOGRAPHY CHEST WITH CONTRAST TECHNIQUE: Multidetector CT imaging of the chest was performed using the standard protocol during bolus administration of intravenous contrast. Multiplanar CT image reconstructions and MIPs were obtained to evaluate the vascular anatomy. CONTRAST:  100 mL Isovue 370 IV COMPARISON:  Chest x-ray earlier today. FINDINGS: Vascular: The thoracic aorta and pulmonary arteries are well opacified. There is no evidence of pulmonary embolism. The thoracic aorta demonstrates no aneurysmal disease or dissection. Proximal great vessels are normally patent. Extensive calcified plaque is seen throughout the coronary tree in a 3 vessel distribution. The heart size is normal. There is some thinning of the myocardium at the left ventricular apex which could implicate prior infarct. The overall heart size appears normal. No pericardial fluid. There is extensive calcifications at the level of the aortic valve and aortic annulus. Mediastinum/Nodes: No mediastinal masses or lymphadenopathy identified. No evidence of hilar lymphadenopathy. Lungs/Pleura: Lungs show evidence of moderate pulmonary edema throughout all lobes with ground-glass edema present. Associated trace amount of pleural fluid bilaterally. No infiltrates, pneumothorax or pulmonary nodules. Upper abdomen: Extensively calcified splenic artery. Visualized upper abdominal aorta is of normal caliber. Separate origins of a left gastric artery, celiac axis, superior mesenteric artery and bilateral single renal arteries demonstrate no significant stenosis. Musculoskeletal: No bony abnormalities identified. Review of the MIP images confirms the above findings. IMPRESSION: 1. No evidence of pulmonary embolism or acute aortic pathology. 2. Coronary atherosclerosis with extensive calcified plaque seen in a 3 vessel distribution. Thinning of the myocardium at the left ventricular apex could implicate prior infarct. 3.  Extensive aortic valvular and annular calcifications. 4. Moderate pulmonary edema with trace bilateral pleural effusions. Electronically Signed   By: Aletta Edouard M.D.   On: 10/10/2015 12:36    EKG: Orders placed or performed during the hospital encounter of 10/11/15  . ED EKG within 10 minutes  . ED EKG within 10 minutes    IMPRESSION AND PLAN: * Chest pain   Negative stress test 2 weeks ago.   Monitor on tele, serial troponin.   Cont cardiac meds.   Dr.Fath will do cath tomorrow.  * DM   ISS, hold victosa and actose, hold his insulin delivery device.  * Htn   Cont home meds.  * hyperlipidemia   Continue crestor.  * Hypothyroidism   Cont levothyroxine.   All the records are reviewed and case discussed with ED provider. Management plans discussed with the patient, family and they are in agreement.  CODE STATUS:    Code Status Orders        Start     Ordered  10/11/15 1245  Full code   Continuous     10/11/15 1244    Code Status History    Date Active Date Inactive Code Status Order ID Comments User Context   09/24/2015  3:12 AM 09/25/2015  3:06 PM Full Code [id number]  Saundra Shelling, MD Inpatient   11/14/2014  5:07 AM 11/15/2014  4:53 PM Full Code [id number]  Harrie Foreman, MD ED    Advance Directive Documentation        Most Recent Value   Type of Advance Directive  Healthcare Power of Attorney, Living will   Pre-existing out of facility DNR order (yellow form or pink MOST form)     "MOST" Form in Place?         TOTAL TIME TAKING CARE OF THIS PATIENT: 50 minutes.    Vaughan Basta M.D on 10/11/2015   Between 7am to 6pm - Pager - [phone]  After 6pm go to [email or web address] - password [email or web address] [facility]  Office  [phone]  CC: Primary care physician; Christie Nottingham., PA   Note: This dictation was prepared with Dragon dictation along with smaller phrase technology. Any transcriptional errors that result from this  process are unintentional.

## 2015-10-11 NOTE — ED Notes (Signed)
Pt reports he has pumonary edema and elevated troponin. Pt was to be admitted this morning. Pt left AMA. Pt alert and oriented in triage. VSS. Pt in no apparent distress in triage.

## 2015-10-11 NOTE — Consult Note (Addendum)
[facility]  CARDIOLOGY CONSULT NOTE  Patient ID: Dennis Mullen MRN: [id number] DOB/AGE: February 03, 1951 65 y.o.  Admit date: 10/11/2015 Referring Physician Dr. Anselm Jungling Primary Physician   Primary Cardiologist Dr. [name] Reason for Consultation chest pain  HPI: Pt is a 65 yo male with history of cad s/p pci with the most recent cardiac catheterization on 08/14/2013 which showed normal ejection fraction of 59%. A drug-eluting stent was performed in a 75% lesion in the proximal RCA and a second percutaneous intervention was performed on a 90% lesion in the mid RCA. Distal LAD diffuse 40% stenosis and in the right PDA there was a tubular 50% stenosis. He was treated with duap including asa and plavix. He has been in the er daily for the past several days with chest pain. He has had mildly elevated serum troponins  He was seen in the er this am and was scheduled for admission but left to go to his cardiologists office who told him to return to the er. He is now admitted. EKG is unremarkable.  Serum troponin is mildly elevated at 0.16. Pain has both typical and atypical features. Some increased pain with deep palpation but also has some pain with rest.   Review of Systems  HENT: Negative.   Eyes: Negative.   Respiratory: Negative.   Cardiovascular: Positive for chest pain.  Gastrointestinal: Negative.   Genitourinary: Negative.   Musculoskeletal: Negative.   Skin: Negative.   Neurological: Positive for weakness.  Endo/Heme/Allergies: Negative.   Psychiatric/Behavioral: Negative.     Past Medical History  Diagnosis Date  . Obesity   . Diabetes mellitus, type II ([place])   . Thyroid disease   . Anxiety   . Bipolar disorder ([place])   . History of borderline personality disorder   . Depression   . Neuropathy ([place])   . Diabetes mellitus, type II, insulin dependent ([place])   . Hypertension   . CHF (congestive heart failure) ([place])     ?  Marland Kitchen CAD  (coronary artery disease)   . Gout   . Mood disorder ([place])   . Insomnia   . PVD (peripheral vascular disease) ([place])   . Abnormal levels of other serum enzymes   . GERD (gastroesophageal reflux disease)   . BPH (benign prostatic hyperplasia)   . ED (erectile dysfunction)   . Hypogonadism in male   . OSA (obstructive sleep apnea)     Family History  Problem Relation Age of Onset  . Lung cancer Mother   . Heart attack Father   . Post-traumatic stress disorder Father   . Anxiety disorder Father   . Depression Father   . Heart attack Sister   . Heart attack Brother     Social History   Social History  . Marital Status: Single    Spouse Name: N/A  . Number of Children: N/A  . Years of Education: N/A   Occupational History  . retired    Social History Main Topics  . Smoking status: Never Smoker   . Smokeless tobacco: Never Used  . Alcohol Use: No  . Drug Use: No  . Sexual Activity: Not Currently    Birth Control/ Protection: None   Other Topics Concern  . Not on file   Social History Narrative   ** Merged History Encounter **        Past Surgical History  Procedure Laterality Date  . Tonsillectomy and adenoidectomy    . Nasal septum surgery    .  Mastoidectomy Right   . Penial inplant    . Coronary angioplasty with stent placement      x5 stents  . Right mastoidectomy    . Penile prosthesis implant    . Foot surgery    . Scrotoplasty       Prescriptions prior to admission  Medication Sig Dispense Refill Last Dose  . ACCU-CHEK SMARTVIEW test strip Use as directed.   unknown at unknown  . allopurinol (ZYLOPRIM) 100 MG tablet Take 100 mg by mouth every morning.   10/09/2015 at unknown  . amLODipine (NORVASC) 5 MG tablet Take 5 mg by mouth daily.    10/09/2015 at unknown  . clopidogrel (PLAVIX) 75 MG tablet Take 75 mg by mouth every morning. Reported on 07/26/2015   10/09/2015 at unknown  . colchicine 0.6 MG tablet Take 0.6 mg by mouth 2 (two) times daily as  needed.   prn at prn  . diclofenac (VOLTAREN) 75 MG EC tablet Take 75 mg by mouth 2 (two) times daily as needed for mild pain or moderate pain.    prn at prn  . divalproex (DEPAKOTE ER) 500 MG 24 hr tablet Take 500-1,000 mg by mouth 2 (two) times daily. Take 535m in morning and 10021min evening   10/09/2015 at unknown  . DULoxetine (CYMBALTA) 60 MG capsule Take 1 capsule (60 mg total) by mouth daily. 30 capsule 5 10/09/2015 at unknown  . esomeprazole (NEXIUM) 40 MG capsule Take 40 mg by mouth daily at 12 noon.    10/09/2015 at unknown  . furosemide (LASIX) 40 MG tablet Take 1 tablet (40 mg total) by mouth daily. 30 tablet 0 10/09/2015 at unknown  . gemfibrozil (LOPID) 600 MG tablet Take 600 mg by mouth 2 (two) times daily before a meal.   10/09/2015 at unknown  . glucose 4 GM chewable tablet Chew 1 tablet by mouth as needed for low blood sugar.   prn at prn  . insulin aspart (NOVOLOG) 100 UNIT/ML injection Inject into the skin. Reported on 10/11/2015   unknown at unknown  . Insulin Disposable Pump (V-GO 30) KIT Reported on 06/29/2015   unknown at unknown  . Insulin Pen Needle (NOVOFINE) 32G X 6 MM MISC Reported on 07/26/2015   unknown at unknown  . levothyroxine (SYNTHROID, LEVOTHROID) 50 MCG tablet Take 50 mcg by mouth daily before breakfast.   10/09/2015 at unknown  . metFORMIN (GLUCOPHAGE) 1000 MG tablet Take 1,000 mg by mouth 2 (two) times daily with a meal.   10/09/2015 at unknown  . metoprolol succinate (TOPROL-XL) 50 MG 24 hr tablet Take 50 mg by mouth daily.    10/09/2015 at unknown  . nitroGLYCERIN (NITROSTAT) 0.4 MG SL tablet Place 0.4 mg under the tongue every 5 (five) minutes x 3 doses as needed for chest pain.    prn at prn  . pioglitazone (ACTOS) 15 MG tablet Take 15 mg by mouth every morning.    10/09/2015 at unknown  . pregabalin (LYRICA) 100 MG capsule Take 100 mg by mouth 3 (three) times daily.   10/09/2015 at unknown  . rOPINIRole (REQUIP) 1 MG tablet Take 1 mg by mouth at bedtime.     10/09/2015 at unknown  . rosuvastatin (CRESTOR) 5 MG tablet Take 5 mg by mouth daily at 6 PM.    10/09/2015 at unknown  . VICTOZA 18 MG/3ML SOPN Inject 1.8 mg into the skin daily.    unknown at unknown  . vitamin C (ASCORBIC ACID) 500 MG  tablet Take 500 mg by mouth daily as needed.   prn at prn  . zolpidem (AMBIEN) 10 MG tablet Take 10 mg by mouth at bedtime. Reported on 07/26/2015   prn at prn  . aspirin EC 81 MG tablet Take 81 mg by mouth every morning. Reported on 07/26/2015   10/08/2015 at Unknown time    Physical Exam: Blood pressure 150/67, pulse 72, temperature 98.2 F (36.8 C), temperature source Oral, resp. rate 20, height _0  (1.88 m), weight 135.036 kg (297 lb 11.2 oz), SpO2 98 %.   Wt Readings from Last 1 Encounters:  10/11/15 135.036 kg (297 lb 11.2 oz)     General appearance: alert and cooperative Head: Normocephalic, without obvious abnormality, atraumatic Resp: clear to auscultation bilaterally Cardio: regular rate and rhythm GI: soft, non-tender; bowel sounds normal; no masses,  no organomegaly Neurologic: Grossly normal  Labs:   Lab Results  Component Value Date   WBC 6.9 10/11/2015   HGB 10.2* 10/11/2015   HCT 30.6* 10/11/2015   MCV 92.1 10/11/2015   PLT 274 10/11/2015    Recent Labs Lab 10/11/15 0708  NA 139  K 4.5  CL 109  CO2 23  BUN 22*  CREATININE 1.01  CALCIUM 9.1  GLUCOSE 109*   Lab Results  Component Value Date   CKTOTAL 249 08/15/2013   CKMB 7.8* 08/15/2013   TROPONINI 0.13* 10/11/2015      Radiology:  CXR improved from previous studies which showed some pulmonary edema.  EKG: nsr with no ischemia  ASSESSMENT AND PLAN:  Pt with history of cad s/p pci with most recent  cardiac catheterization on 08/14/2013 which showed normal ejection fraction of 59%. A drug-eluting stent was performed in a 75% lesion in the proximal RCA and a second percutaneous intervention was performed on a 90% lesion in the mid RCA. Distal LAD diffuse 40% stenosis  and in the right PDA there was a tubular 50% stenosis. Now returned with recurrent chest pain which has been present for several weeks prompting multiple er vists and several admissions. He now has recurrent chest pain with mild tropoinin elevation. Given rest pain, history of cad and elevated tropoinin, will proceed with left cardiac cath in am to evaluate for eveidence of cad as the casue of symptoms. Continue with asa and plavix for now along with his colchicine. Also continue with metoprolol and crestor. Careful diuresis with lasix.  Signed: Teodoro Spray MD, [facility] 10/11/2015, 1:48 PM

## 2015-10-12 ENCOUNTER — Encounter
Admission: EM | Disposition: A | Discharge status: Home / Self Care | Payer: Self-pay | Source: Home / Self Care | Attending: Emergency Medicine

## 2015-10-12 ENCOUNTER — Encounter: Payer: Self-pay | Admitting: Cardiology

## 2015-10-12 HISTORY — PX: CARDIAC CATHETERIZATION: SHX172

## 2015-10-12 LAB — CBC
HCT: 33 % — ABNORMAL LOW (ref 40.0–52.0)
Hemoglobin: 10.9 g/dL — ABNORMAL LOW (ref 13.0–18.0)
MCH: 30.5 pg (ref 26.0–34.0)
MCHC: 33 g/dL (ref 32.0–36.0)
MCV: 92.2 fL (ref 80.0–100.0)
PLATELETS: 266 10*3/uL (ref 150–440)
RBC: 3.58 MIL/uL — AB (ref 4.40–5.90)
RDW: 16.5 % — ABNORMAL HIGH (ref 11.5–14.5)
WBC: 6.4 10*3/uL (ref 3.8–10.6)

## 2015-10-12 LAB — GLUCOSE, CAPILLARY: Glucose-Capillary: 148 mg/dL — ABNORMAL HIGH (ref 65–99)

## 2015-10-12 LAB — BASIC METABOLIC PANEL
Anion gap: 7 (ref 5–15)
BUN: 25 mg/dL — ABNORMAL HIGH (ref 6–20)
CHLORIDE: 106 mmol/L (ref 101–111)
CO2: 26 mmol/L (ref 22–32)
CREATININE: 1.01 mg/dL (ref 0.61–1.24)
Calcium: 9.2 mg/dL (ref 8.9–10.3)
GFR calc non Af Amer: >60 mL/min (ref >60)
GLUCOSE: 132 mg/dL — AB (ref 65–99)
Potassium: 4.4 mmol/L (ref 3.5–5.1)
Sodium: 139 mmol/L (ref 135–145)

## 2015-10-12 LAB — PROTIME-INR
INR: 1.2
PROTHROMBIN TIME: 15.4 s — AB (ref 11.4–15.0)

## 2015-10-12 SURGERY — LEFT HEART CATH
Anesthesia: Moderate Sedation

## 2015-10-12 MED ORDER — FENTANYL CITRATE (PF) 100 MCG/2ML IJ SOLN
INTRAMUSCULAR | Status: AC
  Filled 2015-10-12: qty 2

## 2015-10-12 MED ORDER — MIDAZOLAM HCL 2 MG/2ML IJ SOLN
INTRAMUSCULAR | Status: AC
  Filled 2015-10-12: qty 2

## 2015-10-12 MED ORDER — SODIUM CHLORIDE 0.9 % WEIGHT BASED INFUSION
1.0000 mL/kg/h | INTRAVENOUS | Status: AC
  Administered 2015-10-12: 1 mL/kg/h via INTRAVENOUS

## 2015-10-12 MED ORDER — FENTANYL CITRATE (PF) 100 MCG/2ML IJ SOLN
INTRAMUSCULAR | Status: DC | PRN
  Administered 2015-10-12 (×2): 25 ug via INTRAVENOUS

## 2015-10-12 MED ORDER — MIDAZOLAM HCL 2 MG/2ML IJ SOLN
INTRAMUSCULAR | Status: DC | PRN
  Administered 2015-10-12 (×2): 1 mg via INTRAVENOUS

## 2015-10-12 MED ORDER — HEPARIN (PORCINE) IN NACL 2-0.9 UNIT/ML-% IJ SOLN
INTRAMUSCULAR | Status: AC
  Filled 2015-10-12: qty 500

## 2015-10-12 MED ORDER — SODIUM CHLORIDE 0.9 % IV SOLN: 250.0000 mL | INTRAVENOUS | Status: DC | PRN

## 2015-10-12 MED ORDER — ONDANSETRON HCL 4 MG/2ML IJ SOLN: 4.0000 mg | Freq: Four times a day (QID) | INTRAMUSCULAR | Status: DC | PRN

## 2015-10-12 MED ORDER — ISOSORBIDE MONONITRATE ER 30 MG PO TB24
30.0000 mg | ORAL_TABLET | Freq: Every day | ORAL | Status: DC
  Administered 2015-10-12: 30 mg via ORAL
  Filled 2015-10-12 (×3): qty 1

## 2015-10-12 MED ORDER — IOPAMIDOL (ISOVUE-300) INJECTION 61%
INTRAVENOUS | Status: DC | PRN
  Administered 2015-10-12: 110 mL via INTRA_ARTERIAL

## 2015-10-12 MED ORDER — ACETAMINOPHEN 325 MG PO TABS: 650.0000 mg | ORAL_TABLET | ORAL | Status: DC | PRN

## 2015-10-12 MED ORDER — ISOSORBIDE MONONITRATE ER 30 MG PO TB24: 30.0000 mg | ORAL_TABLET | Freq: Every day | ORAL | Status: DC

## 2015-10-12 MED ORDER — SODIUM CHLORIDE 0.9 % IV SOLN
Freq: Once | INTRAVENOUS | Status: AC
  Administered 2015-10-12: 07:00:00 via INTRAVENOUS

## 2015-10-12 MED ORDER — SODIUM CHLORIDE 0.9% FLUSH: 3.0000 mL | INTRAVENOUS | Status: DC | PRN

## 2015-10-12 MED ORDER — SODIUM CHLORIDE 0.9% FLUSH: 3.0000 mL | Freq: Two times a day (BID) | INTRAVENOUS | Status: DC

## 2015-10-12 SURGICAL SUPPLY — 10 items
CATH INFINITI 5FR ANG PIGTAIL (CATHETERS) ×2 IMPLANT
CATH INFINITI 5FR JL4 (CATHETERS) ×2 IMPLANT
CATH INFINITI JR4 5F (CATHETERS) ×2 IMPLANT
DEVICE CLOSURE MYNXGRIP 5F (Vascular Products) ×2 IMPLANT
KIT MANI 3VAL PERCEP (MISCELLANEOUS) ×3 IMPLANT
NDL PERC 18GX7CM (NEEDLE) IMPLANT
NEEDLE PERC 18GX7CM (NEEDLE) ×3 IMPLANT
PACK CARDIAC CATH (CUSTOM PROCEDURE TRAY) ×3 IMPLANT
SHEATH AVANTI 5FR X 11CM (SHEATH) ×2 IMPLANT
WIRE EMERALD 3MM-J .035X150CM (WIRE) ×4 IMPLANT

## 2015-10-12 NOTE — Discharge Summary (Signed)
[facility] [place] NAME: Dennis Mullen    MR#:  [id number]  DATE OF BIRTH:  [birth year]  DATE OF ADMISSION:  10/11/2015 ADMITTING PHYSICIAN: Vaughan Basta, MD  DATE OF DISCHARGE: 10/12/2015  PRIMARY CARE PHYSICIAN: Christie Nottingham., PA    ADMISSION DIAGNOSIS:  NSTEMI (non-ST elevated myocardial infarction) ([place]) [I21.4]  DISCHARGE DIAGNOSIS:  Active Problems:   Chest pain at rest   SOB (shortness of breath)   Elevated troponin   SECONDARY DIAGNOSIS:   Past Medical History  Diagnosis Date  . Obesity   . Diabetes mellitus, type II ([place])   . Thyroid disease   . Anxiety   . Bipolar disorder ([place])   . History of borderline personality disorder   . Depression   . Neuropathy ([place])   . Diabetes mellitus, type II, insulin dependent ([place])   . Hypertension   . CHF (congestive heart failure) ([place])     ?  Marland Kitchen CAD (coronary artery disease)   . Gout   . Mood disorder ([place])   . Insomnia   . PVD (peripheral vascular disease) ([place])   . Abnormal levels of other serum enzymes   . GERD (gastroesophageal reflux disease)   . BPH (benign prostatic hyperplasia)   . ED (erectile dysfunction)   . Hypogonadism in male   . OSA (obstructive sleep apnea)     HOSPITAL COURSE:   * Unstable angina- with elevated troponin.   Monitored on telemetry, continued cardiac meds.   Cardiac catheterization was done by Dr. Ubaldo Glassing- negative for any acute findings or blockages.   Suggested medical management.   Added Imdur.  * DM  ISS, hold victosa and actose, hold his insulin delivery device.  * Htn  Cont home meds.  * hyperlipidemia  Continue crestor.  * Hypothyroidism  Cont levothyroxine.  DISCHARGE CONDITIONS:   Stable.  CONSULTS OBTAINED:  Treatment Team:  Teodoro Spray, MD  DRUG ALLERGIES:  No Known Allergies  DISCHARGE MEDICATIONS:   Current Discharge Medication List    START taking these medications   Details   isosorbide mononitrate (IMDUR) 30 MG 24 hr tablet Take 1 tablet (30 mg total) by mouth daily. Qty: 30 tablet, Refills: 0      CONTINUE these medications which have NOT CHANGED   Details  ACCU-CHEK SMARTVIEW test strip Use as directed.    allopurinol (ZYLOPRIM) 100 MG tablet Take 100 mg by mouth every morning.    amLODipine (NORVASC) 5 MG tablet Take 5 mg by mouth daily.     clopidogrel (PLAVIX) 75 MG tablet Take 75 mg by mouth every morning. Reported on 07/26/2015    colchicine 0.6 MG tablet Take 0.6 mg by mouth 2 (two) times daily as needed.    diclofenac (VOLTAREN) 75 MG EC tablet Take 75 mg by mouth 2 (two) times daily as needed for mild pain or moderate pain.     divalproex (DEPAKOTE ER) 500 MG 24 hr tablet Take 500-1,000 mg by mouth 2 (two) times daily. Take 520m in morning and 10076min evening    DULoxetine (CYMBALTA) 60 MG capsule Take 1 capsule (60 mg total) by mouth daily. Qty: 30 capsule, Refills: 5    esomeprazole (NEXIUM) 40 MG capsule Take 40 mg by mouth daily at 12 noon.     furosemide (LASIX) 40 MG tablet Take 1 tablet (40 mg total) by mouth daily. Qty: 30 tablet, Refills: 0    gemfibrozil (LOPID) 600 MG tablet Take  600 mg by mouth 2 (two) times daily before a meal.    glucose 4 GM chewable tablet Chew 1 tablet by mouth as needed for low blood sugar.    insulin aspart (NOVOLOG) 100 UNIT/ML injection Inject into the skin. Reported on 10/11/2015    Insulin Disposable Pump (V-GO 30) KIT Reported on 06/29/2015    Insulin Pen Needle (NOVOFINE) 32G X 6 MM MISC Reported on 07/26/2015    levothyroxine (SYNTHROID, LEVOTHROID) 50 MCG tablet Take 50 mcg by mouth daily before breakfast.    metFORMIN (GLUCOPHAGE) 1000 MG tablet Take 1,000 mg by mouth 2 (two) times daily with a meal.    metoprolol succinate (TOPROL-XL) 50 MG 24 hr tablet Take 50 mg by mouth daily.     nitroGLYCERIN (NITROSTAT) 0.4 MG SL tablet Place 0.4 mg under the tongue every 5 (five) minutes x 3 doses  as needed for chest pain.     pioglitazone (ACTOS) 15 MG tablet Take 15 mg by mouth every morning.     pregabalin (LYRICA) 100 MG capsule Take 100 mg by mouth 3 (three) times daily.    rOPINIRole (REQUIP) 1 MG tablet Take 1 mg by mouth at bedtime.     rosuvastatin (CRESTOR) 5 MG tablet Take 5 mg by mouth daily at 6 PM.     VICTOZA 18 MG/3ML SOPN Inject 1.8 mg into the skin daily.     vitamin C (ASCORBIC ACID) 500 MG tablet Take 500 mg by mouth daily as needed.    zolpidem (AMBIEN) 10 MG tablet Take 10 mg by mouth at bedtime. Reported on 07/26/2015    aspirin EC 81 MG tablet Take 81 mg by mouth every morning. Reported on 07/26/2015         DISCHARGE INSTRUCTIONS:    Follow with PMD in 2 weeks.  If you experience worsening of your admission symptoms, develop shortness of breath, life threatening emergency, suicidal or homicidal thoughts you must seek medical attention immediately by calling 911 or calling your MD immediately  if symptoms less severe.  You Must read complete instructions/literature along with all the possible adverse reactions/side effects for all the Medicines you take and that have been prescribed to you. Take any new Medicines after you have completely understood and accept all the possible adverse reactions/side effects.   Please note  You were cared for by a hospitalist during your hospital stay. If you have any questions about your discharge medications or the care you received while you were in the hospital after you are discharged, you can call the unit and asked to speak with the hospitalist on call if the hospitalist that took care of you is not available. Once you are discharged, your primary care physician will handle any further medical issues. Please note that NO REFILLS for any discharge medications will be authorized once you are discharged, as it is imperative that you return to your primary care physician (or establish a relationship with a primary care  physician if you do not have one) for your aftercare needs so that they can reassess your need for medications and monitor your lab values.    Today   CHIEF COMPLAINT:   Chief Complaint  Patient presents with  . Shortness of Breath    HISTORY OF PRESENT ILLNESS:  Dennis Mullen  is a 65 y.o. male with a known history of Type 2 diabetes mellitus, obesity, hypothyroidism, bipolar disorder, neuropathy, hypertension, coronary artery disease with cardiac stent presented to the emergency room with  chest pain. He had admission 2 weeks ago and negative stress test here. He visited ER in last 3-4 days almost everyday for chest pain, CT chest negative for PE. Today he came with SOB, troponin is slightly elevated for the first time, so given as admission.   VITAL SIGNS:  Blood pressure 147/66, pulse 55, temperature 98.3 F (36.8 C), temperature source Oral, resp. rate 18, height 6' 2"  (1.88 m), weight 134.98 kg (297 lb 9.2 oz), SpO2 90 %.  I/O:   Intake/Output Summary (Last 24 hours) at 10/12/15 1343 Last data filed at 10/12/15 1321  Gross per 24 hour  Intake    480 ml  Output   4100 ml  Net  -3620 ml    PHYSICAL EXAMINATION:  GENERAL:  65 y.o.-year-old patient lying in the bed with no acute distress.  EYES: Pupils equal, round, reactive to light and accommodation. No scleral icterus. Extraocular muscles intact.  HEENT: Head atraumatic, normocephalic. Oropharynx and nasopharynx clear.  NECK:  Supple, no jugular venous distention. No thyroid enlargement, no tenderness.  LUNGS: Normal breath sounds bilaterally, no wheezing, rales,rhonchi or crepitation. No use of accessory muscles of respiration.  CARDIOVASCULAR: S1, S2 normal. No murmurs, rubs, or gallops.  ABDOMEN: Soft, non-tender, non-distended. Bowel sounds present. No organomegaly or mass.  EXTREMITIES: No pedal edema, cyanosis, or clubbing.  NEUROLOGIC: Cranial nerves II through XII are intact. Muscle strength 5/5 in all  extremities. Sensation intact. Gait not checked.  PSYCHIATRIC: The patient is alert and oriented x 3.  SKIN: No obvious rash, lesion, or ulcer.   DATA REVIEW:   CBC  Recent Labs Lab 10/12/15 0650  WBC 6.4  HGB 10.9*  HCT 33.0*  PLT 266    Chemistries   Recent Labs Lab 10/12/15 0650  NA 139  K 4.4  CL 106  CO2 26  GLUCOSE 132*  BUN 25*  CREATININE 1.01  CALCIUM 9.2    Cardiac Enzymes  Recent Labs Lab 10/11/15 1932  TROPONINI 0.10*    Microbiology Results  No results found for this or any previous visit.  RADIOLOGY:  Dg Chest 2 View  10/11/2015  CLINICAL DATA:  One day history of shortness of breath and cough; history of CHF and diabetes and coronary stent placement EXAM: CHEST  2 VIEW COMPARISON:  Chest x-ray of October 10, 2015 FINDINGS: The lungs are well-expanded. The interstitial markings are less conspicuous today. There is no alveolar infiltrate or pleural effusion. The heart is normal in size. There is mild central pulmonary vascular prominence which has improved. The trachea is midline. IMPRESSION: Improved appearance of the chest consistent with decreasing pulmonary interstitial edema. Electronically Signed   By: David  Martinique M.D.   On: 10/11/2015 08:22    EKG:   Orders placed or performed during the hospital encounter of 10/11/15  . ED EKG within 10 minutes  . ED EKG within 10 minutes      Management plans discussed with the patient, family and they are in agreement.  CODE STATUS:     Code Status Orders        Start     Ordered   10/11/15 1245  Full code   Continuous     10/11/15 1244    Code Status History    Date Active Date Inactive Code Status Order ID Comments User Context   09/24/2015  3:12 AM 09/25/2015  3:06 PM Full Code [id number]  Saundra Shelling, MD Inpatient   11/14/2014  5:07 AM 11/15/2014  4:53  PM Full Code [id number]  Harrie Foreman, MD ED    Advance Directive Documentation        Most Recent Value   Type of Advance  Directive  Healthcare Power of Attorney, Living will   Pre-existing out of facility DNR order (yellow form or pink MOST form)     "MOST" Form in Place?        TOTAL TIME TAKING CARE OF THIS PATIENT: 35 minutes.    Vaughan Basta M.D on 10/12/2015 at 1:43 PM  Between 7am to 6pm - Pager - [phone]  After 6pm go to [email or web address] - password [email or web address] [facility]  Office  [phone]  CC: Primary care physician; Christie Nottingham., PA   Note: This dictation was prepared with Dragon dictation along with smaller phrase technology. Any transcriptional errors that result from this process are unintentional.

## 2015-10-12 NOTE — Progress Notes (Signed)
Report given to RN in specials for patient's cardiac cath. Fluids turned down to a rate of 50 due to patient's PMH of CHF. Patient taken down for cardiac cath by transporter. Nursing staff will continue to monitor. Earleen Reaper, RN

## 2015-10-12 NOTE — Progress Notes (Signed)
Inpatient Diabetes Program Recommendations  AACE/ADA: New Consensus Statement on Inpatient Glycemic Control (2015)  Target Ranges:  Prepandial:   less than 140 mg/dL      Peak postprandial:   less than 180 mg/dL (1-2 hours)      Critically ill patients:  140 - 180 mg/dL   Lab Results  Component Value Date   GLUCAP 177* 10/11/2015   HGBA1C 6.4* 09/24/2015    Review of Glycemic Control  Results for Dennis Mullen, Dennis Mullen (MRN [id number]) as of 10/12/2015 10:00  Ref. Range 10/11/2015 11:23 10/11/2015 16:33 10/11/2015 20:42  Glucose-Capillary Latest Ref Range: 65-99 mg/dL 124 (H) 292 (H) 177 (H)    Diabetes history: Type 2 Outpatient Diabetes medications: Glucophage 1000mg  bid, Actos 15mg /day, V-Go 30 (uses Novolog insulin and delivers basal and bolus insulin) Current orders for Inpatient glycemic control: Glucophage 1000mg  bid, Novolog 0-9 units tid, Lantus 20 units daily  Inpatient Diabetes Program Recommendations:  Agree with current diabetes medication orders.  Gentry Fitz, RN, BA, MHA, CDE Diabetes Coordinator Inpatient Diabetes Program  [phone] (Team Pager) [phone] ([place]) 10/12/2015 10:04 AM

## 2015-10-12 NOTE — Progress Notes (Signed)
[place]  SUBJECTIVE: Stable this am. Intermitent chest pain. Ruled out for mi.   Filed Vitals:   10/11/15 2016 10/12/15 0526 10/12/15 0726 10/12/15 0748  BP: 145/60 122/45 147/67   Pulse: 61 57 57   Temp: 98.2 F (36.8 C) 98.3 F (36.8 C) 98 F (36.7 C)   TempSrc: Oral Oral Oral   Resp: 18 18 12    Height:      Weight:  134.98 kg (297 lb 9.2 oz)    SpO2: 92% 92% 98% 99%    Intake/Output Summary (Last 24 hours) at 10/12/15 0851 Last data filed at 10/12/15 0735  Gross per 24 hour  Intake    240 ml  Output   3325 ml  Net  -3085 ml    LABS: Basic Metabolic Panel:  Recent Labs  10/11/15 0708 10/12/15 0650  NA 139 139  K 4.5 4.4  CL 109 106  CO2 23 26  GLUCOSE 109* 132*  BUN 22* 25*  CREATININE 1.01 1.01  CALCIUM 9.1 9.2   Liver Function Tests: No results for input(s): AST, ALT, ALKPHOS, BILITOT, PROT, ALBUMIN in the last 72 hours. No results for input(s): LIPASE, AMYLASE in the last 72 hours. CBC:  Recent Labs  10/11/15 0708 10/12/15 0650  WBC 6.9 6.4  HGB 10.2* 10.9*  HCT 30.6* 33.0*  MCV 92.1 92.2  PLT 274 266   Cardiac Enzymes:  Recent Labs  10/11/15 1139 10/11/15 1443 10/11/15 1932  TROPONINI 0.13* 0.11* 0.10*   BNP: Invalid input(s): POCBNP D-Dimer: No results for input(s): DDIMER in the last 72 hours. Hemoglobin A1C: No results for input(s): HGBA1C in the last 72 hours. Fasting Lipid Panel:  Recent Labs  10/11/15 1443  CHOL 176  HDL 45  LDLCALC 89  TRIG 211*  CHOLHDL 3.9   Thyroid Function Tests: No results for input(s): TSH, T4TOTAL, T3FREE, THYROIDAB in the last 72 hours.  Invalid input(s): FREET3 Anemia Panel: No results for input(s): VITAMINB12, FOLATE, FERRITIN, TIBC, IRON, RETICCTPCT in the last 72 hours.   Physical Exam: Blood pressure 147/67, pulse 57, temperature 98 F (36.7 C), temperature source Oral, resp. rate 12, height 6\' 2"  (1.88 m), weight 134.98 kg (297 lb 9.2 oz),  SpO2 99 %.   Wt Readings from Last 1 Encounters:  10/12/15 134.98 kg (297 lb 9.2 oz)     General appearance: alert and cooperative Resp: clear to auscultation bilaterally Cardio: regular rate and rhythm GI: soft, non-tender; bowel sounds normal; no masses,  no organomegaly Neurologic: Grossly normal  TELEMETRY: Reviewed telemetry pt in nsr:  ASSESSMENT AND PLAN:  Active Problems:   Chest pain at rest-cath completed with no immediate complication. Showed patent stent in rca and circ. LAD disease is diffuse and similar to previous cath 2 years ago. MEdical management. Will continue with asa and plavix. Continue with metoprolol at current dose. Will add long acting nitrates at 30 mg imdur daily   SOB (shortness of breath)-gentle diuresis. LVEDP was 26.    Elevated troponin-does not appear to be nstemi.     Teodoro Spray., MD, [facility] 10/12/2015 8:51 AM

## 2015-10-12 NOTE — Progress Notes (Addendum)
Patient given discharge teaching and paperwork regarding medications, diet, follow-up appointments and activity. Patient understanding verbalized. No complaints at this time. IV and telemetry discontinued prior to leaving. Skin assessment as previously charted and vitals are stable; on room air. Patient being discharged to home. Prescription for imdur handed to patient, education given. Vascular access care education given along with mynx brochure.

## 2015-10-12 NOTE — Progress Notes (Signed)
Report to [facility].  Check right groin for bleeding or hematoma.  Patient will be on bedrest for  hours post sheath pull---out of bed at 09:40.  Bilateral pulses are 2's DP's.Marland Kitchen

## 2015-10-25 ENCOUNTER — Ambulatory Visit: Payer: Medicare HMO | Admitting: Psychiatry

## 2015-10-28 ENCOUNTER — Telehealth: Payer: Self-pay

## 2015-10-28 NOTE — Telephone Encounter (Signed)
REQUEST FOR REFILL ON DULOXETINE HCL 60MG  . PT LAST SEEN ON  04-27-15 AND NEXT APPT 11-02-15.

## 2015-10-28 NOTE — Telephone Encounter (Signed)
CALLED IN  6 CAPSULE FOR PATIENT TO HAVE ENOUGH MEDICATION UNTIL HIS APPT ON  11-02-15.

## 2015-11-02 ENCOUNTER — Encounter: Payer: Self-pay | Admitting: Psychiatry

## 2015-11-02 ENCOUNTER — Ambulatory Visit (INDEPENDENT_AMBULATORY_CARE_PROVIDER_SITE_OTHER): Payer: 59 | Admitting: Psychiatry

## 2015-11-02 VITALS — BP 124/78 | HR 79 | Temp 97.8°F | Ht 74.0 in | Wt 303.8 lb

## 2015-11-02 DIAGNOSIS — F3170 Bipolar disorder, currently in remission, most recent episode unspecified: Secondary | ICD-10-CM | POA: Diagnosis not present

## 2015-11-02 MED ORDER — DULOXETINE HCL 60 MG PO CPEP: 60.0000 mg | ORAL_CAPSULE | Freq: Every day | ORAL | Status: DC

## 2015-11-02 MED ORDER — DIVALPROEX SODIUM ER 500 MG PO TB24: 500.0000 mg | ORAL_TABLET | Freq: Three times a day (TID) | ORAL | Status: DC

## 2015-11-02 NOTE — Progress Notes (Signed)
Patient ID: Dennis Mullen, male   DOB: Dec 15, 1950, 65 y.o.   MRN: [id number] Psychiatry: Follow-up for 65 year old man with bipolar disorder. No new complaints. Mood has been stable. Since our last visit he has not had any major depression. He has had some stresses in the forms of medical problems but is dealing with them well. No side effects of medicine. On review of systems he denies depression denies mania denies hallucinations denies suicidal ideation.  On mental status he is a neatly dressed and groomed man. Good eye contact. Appropriate interaction. Affect is appropriately reactive. Thoughts are lucid and directed. No evidence of thought disorder. Denies suicidal thoughts. Alert and oriented 4.  Recent labs are good. No need to change medicine. Continue Depakote and Cymbalta. Follow-up 6 months.

## 2015-12-14 ENCOUNTER — Other Ambulatory Visit (HOSPITAL_COMMUNITY): Payer: Self-pay | Admitting: Specialist

## 2015-12-15 ENCOUNTER — Encounter: Payer: Self-pay | Admitting: Psychiatry

## 2015-12-20 ENCOUNTER — Emergency Department
Admission: EM | Admit: 2015-12-20 | Discharge: 2015-12-20 | Disposition: A | Discharge status: Other Acute Inpatient Hospital | Payer: Medicare Other | Attending: Emergency Medicine | Admitting: Emergency Medicine

## 2015-12-20 ENCOUNTER — Emergency Department: Payer: Medicare Other

## 2015-12-20 DIAGNOSIS — Z79899 Other long term (current) drug therapy: Secondary | ICD-10-CM | POA: Diagnosis not present

## 2015-12-20 DIAGNOSIS — E039 Hypothyroidism, unspecified: Secondary | ICD-10-CM | POA: Insufficient documentation

## 2015-12-20 DIAGNOSIS — Z794 Long term (current) use of insulin: Secondary | ICD-10-CM | POA: Insufficient documentation

## 2015-12-20 DIAGNOSIS — I200 Unstable angina: Secondary | ICD-10-CM | POA: Insufficient documentation

## 2015-12-20 DIAGNOSIS — E119 Type 2 diabetes mellitus without complications: Secondary | ICD-10-CM | POA: Diagnosis not present

## 2015-12-20 DIAGNOSIS — I252 Old myocardial infarction: Secondary | ICD-10-CM | POA: Insufficient documentation

## 2015-12-20 DIAGNOSIS — I509 Heart failure, unspecified: Secondary | ICD-10-CM | POA: Insufficient documentation

## 2015-12-20 DIAGNOSIS — Z7984 Long term (current) use of oral hypoglycemic drugs: Secondary | ICD-10-CM | POA: Diagnosis not present

## 2015-12-20 DIAGNOSIS — I110 Hypertensive heart disease with heart failure: Secondary | ICD-10-CM | POA: Insufficient documentation

## 2015-12-20 DIAGNOSIS — Z7982 Long term (current) use of aspirin: Secondary | ICD-10-CM | POA: Diagnosis not present

## 2015-12-20 DIAGNOSIS — R072 Precordial pain: Secondary | ICD-10-CM | POA: Diagnosis present

## 2015-12-20 LAB — BASIC METABOLIC PANEL
Anion gap: 9 (ref 5–15)
BUN: 25 mg/dL — AB (ref 6–20)
CALCIUM: 8.8 mg/dL — AB (ref 8.9–10.3)
CO2: 24 mmol/L (ref 22–32)
CREATININE: 1.43 mg/dL — AB (ref 0.61–1.24)
Chloride: 102 mmol/L (ref 101–111)
GFR calc Af Amer: 59 mL/min — ABNORMAL LOW (ref >60)
GFR, EST NON AFRICAN AMERICAN: 51 mL/min — AB (ref >60)
GLUCOSE: 278 mg/dL — AB (ref 65–99)
POTASSIUM: 4.6 mmol/L (ref 3.5–5.1)
SODIUM: 135 mmol/L (ref 135–145)

## 2015-12-20 LAB — PROTIME-INR
INR: 1.07
Prothrombin Time: 13.9 seconds (ref 11.4–15.2)

## 2015-12-20 LAB — APTT: aPTT: 35 seconds (ref 24–36)

## 2015-12-20 LAB — CBC
HEMATOCRIT: 34 % — AB (ref 40.0–52.0)
Hemoglobin: 11.1 g/dL — ABNORMAL LOW (ref 13.0–18.0)
MCH: 29 pg (ref 26.0–34.0)
MCHC: 32.8 g/dL (ref 32.0–36.0)
MCV: 88.7 fL (ref 80.0–100.0)
PLATELETS: 236 10*3/uL (ref 150–440)
RBC: 3.84 MIL/uL — ABNORMAL LOW (ref 4.40–5.90)
RDW: 15.7 % — AB (ref 11.5–14.5)
WBC: 6.3 10*3/uL (ref 3.8–10.6)

## 2015-12-20 LAB — TROPONIN I
TROPONIN I: 0.33 ng/mL — AB (ref <0.03)
Troponin I: 0.04 ng/mL (ref <0.03)

## 2015-12-20 LAB — BRAIN NATRIURETIC PEPTIDE: B NATRIURETIC PEPTIDE 5: 102 pg/mL — AB (ref 0.0–100.0)

## 2015-12-20 MED ORDER — NITROGLYCERIN 0.4 MG SL SUBL
0.4000 mg | SUBLINGUAL_TABLET | SUBLINGUAL | Status: DC | PRN
  Administered 2015-12-20: 0.4 mg via SUBLINGUAL
  Filled 2015-12-20: qty 1

## 2015-12-20 MED ORDER — HEPARIN BOLUS VIA INFUSION
4000.0000 [IU] | Freq: Once | INTRAVENOUS | Status: AC
Start: 2015-12-20 — End: 2015-12-20
  Administered 2015-12-20: 4000 [IU] via INTRAVENOUS
  Filled 2015-12-20: qty 4000

## 2015-12-20 MED ORDER — HEPARIN (PORCINE) IN NACL 100-0.45 UNIT/ML-% IJ SOLN
1450.0000 [IU]/h | INTRAMUSCULAR | Status: DC
Start: 2015-12-20 — End: 2015-12-20
  Administered 2015-12-20: 1450 [IU]/h via INTRAVENOUS
  Filled 2015-12-20 (×2): qty 250

## 2015-12-20 NOTE — ED Notes (Signed)
Notified Dr Alfred Levins of ST depression; MD gave no further orders

## 2015-12-20 NOTE — ED Notes (Addendum)
Pt arrived via ems for c/o chest pain - on ems arrival was 9/10 chest pain - after one NTG SL and 4 asa pt pain decreased to 0/10 - pt is scheduled for cardiac stent surgery Friday - has hx of stent placement (5) - at this time chest pain is 2/10 - pt c/o shortness of breath - denies dizziness - denies nausea/vomting - pt also c/o left foot edema that is chronic

## 2015-12-20 NOTE — ED Provider Notes (Signed)
[facility] Emergency Department Provider Note  ____________________________________________  Time seen: Approximately 2:29 PM  I have reviewed the triage vital signs and the nursing notes.   HISTORY  Chief Complaint Chest Pain   HPI Dennis Mullen is a 65 y.o. male history of CAD, CHF, type 2 diabetes, OSA, obesity, hypertension, hyperlipidemia who presents for evaluation of chest pain. Patient reports that he has been having intermittent episodes of chest pain and shortness of breath for several times a day with minimal exertion since June. Had [place] on 10/12/15 showing multi vessel disease with more severe LAD 60% stenosis. Patient is on Plavix and ASA and imdur for angina. Today was shopping and carrying a bag of sugar when he developed severe substernal chest pain that he describes as pressure, associated with shortness of breath, nausea, 1 episode of vomiting, and dizziness. Patient called EMS. Received a full dose of aspirin on route and 1 sublingual nitroglycerin. Patient now is chest pain-free. Patient is scheduled to undergo left heart catheter for stent placement at [facility] this coming Friday with Dr. [name]. Patient has a strong fh of ischemic heart disease and his father passed away at the age of 75 from an MI and both his siblings died in their 55s from MIs.Patent underwent CTA of the chest in June negative for PE or dissection. He reports that his episodes are happening with less and less exertion but never at rest.  Past Medical History:  Diagnosis Date  . Abnormal levels of other serum enzymes   . Anxiety   . Bipolar disorder ([place])   . BPH (benign prostatic hyperplasia)   . CAD (coronary artery disease)   . CHF (congestive heart failure) ([place])    ?  Marland Kitchen Depression   . Diabetes mellitus, type II ([place])   . Diabetes mellitus, type II, insulin dependent ([place])   . ED (erectile dysfunction)   . GERD (gastroesophageal reflux disease)   . Gout   .  History of borderline personality disorder   . Hypertension   . Hypogonadism in male   . Insomnia   . Mood disorder ([place])   . Neuropathy ([place])   . Obesity   . OSA (obstructive sleep apnea)   . PVD (peripheral vascular disease) ([place])   . Thyroid disease     Patient Active Problem List   Diagnosis Date Noted  . SOB (shortness of breath) 10/11/2015  . Elevated troponin 10/11/2015  . Chest pain at rest 09/24/2015  . Elevated PSA 07/26/2015  . Hypogonadism in male 07/26/2015  . 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 BPH with obstruction/lower urinary tract symptoms 06/29/2015  . Bipolar 1 disorder, mixed ([place]) 04/14/2015  . Borderline personality disorder 04/14/2015  . Acute renal failure ([place]) 11/15/2014  . Chest pain 11/14/2014  . Dizziness 11/05/2014  . Benign essential HTN 11/04/2014  . Combined fat and carbohydrate induced hyperlipemia 11/04/2014  . Bipolar 2 disorder ([place]) 10/27/2014  . Acquired hypothyroidism 02/03/2014  . Type 2 diabetes mellitus ([place]) 02/03/2014  . Adiposity 01/06/2014  . Obesity, diabetes, and hypertension syndrome ([place]) 12/23/2013  . Long term current use of insulin ([place]) 12/23/2013  . Type 2 diabetes mellitus with other diabetic neurological complication ([place]) 92/42/6834  . Disorder affecting the body's metabolism 12/23/2013  . Acute inflammation of the pancreas 11/18/2013  . Elevated cholesterol with elevated triglycerides 11/18/2013  . CAD in native artery 09/26/2013  . Diabetes mellitus ([place]) 09/26/2013  . Acute non-ST elevation myocardial infarction (  NSTEMI) ([place]) 09/26/2013  . Non-ST elevation (NSTEMI) myocardial infarction [facility]) 09/26/2013    Past Surgical History:  Procedure Laterality Date  . CARDIAC CATHETERIZATION N/A 10/12/2015   Procedure: Left Heart Cath and Coronary Angiography;  Surgeon: Teodoro Spray, MD;  Location: [facility];  Service: Cardiovascular;  Laterality: N/A;  . CORONARY ANGIOPLASTY WITH STENT  PLACEMENT     x5 stents  . FOOT SURGERY    . MASTOIDECTOMY Right   . NASAL SEPTUM SURGERY    . penial inplant    . PENILE PROSTHESIS IMPLANT    . right mastoidectomy    . SCROTOPLASTY    . TONSILLECTOMY AND ADENOIDECTOMY      Prior to Admission medications   Medication Sig Start Date End Date Taking? Authorizing Provider  allopurinol (ZYLOPRIM) 100 MG tablet Take 100 mg by mouth every morning.   Yes Historical Provider, MD  amLODipine (NORVASC) 5 MG tablet Take 5 mg by mouth daily.  10/14/14  Yes Historical Provider, MD  aspirin EC 81 MG tablet Take 81 mg by mouth every morning. Reported on 07/26/2015   Yes Historical Provider, MD  clopidogrel (PLAVIX) 75 MG tablet Take 75 mg by mouth every morning. Reported on 07/26/2015   Yes Historical Provider, MD  divalproex (DEPAKOTE ER) 500 MG 24 hr tablet Take 1 tablet (500 mg total) by mouth 3 (three) times daily. Take 573m in morning and 10039min evening Patient taking differently: Take 500-1,000 mg by mouth 2 (two) times daily. Take 50021mn morning and 1000m70m evening 11/02/15  Yes JohnGonzella Lex  DULoxetine (CYMBALTA) 60 MG capsule Take 1 capsule (60 mg total) by mouth daily. 11/02/15  Yes JohnGonzella Lex  esomeprazole (NEXIUM) 40 MG capsule Take 40 mg by mouth daily.  10/14/14  Yes Historical Provider, MD  furosemide (LASIX) 40 MG tablet Take 1 tablet (40 mg total) by mouth daily. 09/25/15  Yes Richard WietLeslye Peer  gemfibrozil (LOPID) 600 MG tablet Take 600 mg by mouth 2 (two) times daily before a meal.   Yes Historical Provider, MD  insulin aspart (NOVOLOG) 100 UNIT/ML injection Inject 12-20 Units into the skin 3 (three) times daily with meals. Reported on 10/11/2015   Yes Historical Provider, MD  Insulin Disposable Pump (V-GO 30) KIT Reported on 06/29/2015 10/07/14  Yes Historical Provider, MD  isosorbide mononitrate (IMDUR) 30 MG 24 hr tablet Take 1 tablet (30 mg total) by mouth daily. Patient taking differently: Take 60 mg by mouth  daily.  10/12/15  Yes VaibVaughan Basta  levothyroxine (SYNTHROID, LEVOTHROID) 50 MCG tablet Take 50 mcg by mouth daily before breakfast.   Yes Historical Provider, MD  metFORMIN (GLUCOPHAGE) 1000 MG tablet Take 1,000 mg by mouth 2 (two) times daily with a meal.   Yes Historical Provider, MD  metoprolol succinate (TOPROL-XL) 50 MG 24 hr tablet Take 50 mg by mouth daily.  09/11/14  Yes Historical Provider, MD  nitroGLYCERIN (NITROSTAT) 0.4 MG SL tablet Place 0.4 mg under the tongue every 5 (five) minutes x 3 doses as needed for chest pain.    Yes Historical Provider, MD  pioglitazone (ACTOS) 15 MG tablet Take 15 mg by mouth every morning.    Yes Historical Provider, MD  pregabalin (LYRICA) 100 MG capsule Take 100 mg by mouth 3 (three) times daily.   Yes Historical Provider, MD  rOPINIRole (REQUIP) 1 MG tablet Take 1 mg by mouth 2 (two) times daily.  04/27/15  Yes Historical Provider,  MD  rosuvastatin (CRESTOR) 5 MG tablet Take 5 mg by mouth daily at 6 PM.  07/05/15  Yes Historical Provider, MD  VICTOZA 18 MG/3ML SOPN Inject 1.8 mg into the skin daily.  09/07/14  Yes Historical Provider, MD    Allergies Review of patient's allergies indicates no known allergies.  Family History  Problem Relation Age of Onset  . Lung cancer Mother   . Heart attack Father   . Post-traumatic stress disorder Father   . Anxiety disorder Father   . Depression Father   . Heart attack Sister   . Heart attack Brother     Social History Social History  Substance Use Topics  . Smoking status: Never Smoker  . Smokeless tobacco: Never Used  . Alcohol use No    Review of Systems Constitutional: Negative for fever. + Lightheadedness Eyes: Negative for visual changes. ENT: Negative for sore throat. Cardiovascular: + chest pain. Respiratory: + shortness of breath. Gastrointestinal: Negative for abdominal pain. + N/V Genitourinary: Negative for dysuria. Musculoskeletal: Negative for back pain. Skin:  Negative for rash. Neurological: Negative for headaches, weakness or numbness.  ____________________________________________   PHYSICAL EXAM:  VITAL SIGNS: ED Triage Vitals  Enc Vitals Group     BP 12/20/15 1403 (!) 146/67     Pulse Rate 12/20/15 1403 80     Resp 12/20/15 1403 20     Temp 12/20/15 1403 98.2 F (36.8 C)     Temp Source 12/20/15 1403 Oral     SpO2 12/20/15 1359 98 %     Weight 12/20/15 1404 (!) 307 lb (139.3 kg)     Height 12/20/15 1404 6' 2"  (1.88 m)     Head Circumference --      Peak Flow --      Pain Score 12/20/15 1404 2     Pain Loc --      Pain Edu? --      Excl. in [place]? --     Constitutional: Alert and oriented. Well appearing and in no apparent distress. HEENT:      Head: Normocephalic and atraumatic.         Eyes: Conjunctivae are normal. Sclera is non-icteric. EOMI. PERRL      Mouth/Throat: Mucous membranes are moist.       Neck: Supple with no signs of meningismus. Cardiovascular: Regular rate and rhythm. No murmurs, gallops, or rubs. 2+ symmetrical distal pulses are present in all extremities. No JVD. Respiratory: Normal respiratory effort. Lungs are clear to auscultation bilaterally. No wheezes, crackles, or rhonchi.  Gastrointestinal: Obese, non tender, and non distended with positive bowel sounds. No rebound or guarding. Genitourinary: No CVA tenderness. Musculoskeletal: Nontender with normal range of motion in all extremities. No edema, cyanosis, or erythema of extremities. Neurologic: Normal speech and language. Face is symmetric. Moving all extremities. No gross focal neurologic deficits are appreciated. Skin: Skin is warm, dry and intact. No rash noted. Psychiatric: Mood and affect are normal. Speech and behavior are normal.  ____________________________________________   LABS (all labs ordered are listed, but only abnormal results are displayed)  Labs Reviewed  BASIC METABOLIC PANEL - Abnormal; Notable for the following:        Result Value   Glucose, Bld 278 (*)    BUN 25 (*)    Creatinine, Ser 1.43 (*)    Calcium 8.8 (*)    GFR calc non Af Amer 51 (*)    GFR calc Af Amer 59 (*)    All other components  within normal limits  CBC - Abnormal; Notable for the following:    RBC 3.84 (*)    Hemoglobin 11.1 (*)    HCT 34.0 (*)    RDW 15.7 (*)    All other components within normal limits  TROPONIN I - Abnormal; Notable for the following:    Troponin I 0.04 (*)    All other components within normal limits  BRAIN NATRIURETIC PEPTIDE - Abnormal; Notable for the following:    B Natriuretic Peptide 102.0 (*)    All other components within normal limits  TROPONIN I - Abnormal; Notable for the following:    Troponin I 0.33 (*)    All other components within normal limits  APTT  PROTIME-INR  HEPARIN LEVEL (UNFRACTIONATED)   ____________________________________________  EKG  ED ECG REPORT I, Rudene Re, the attending physician, personally viewed and interpreted this ECG.  Normal sinus rhythm, rate of 80, normal intervals, normal axis, ST depressions in lateral leads. No STE. Unchanged from prior.  18:55 - Normal sinus rhythm, rate of 68, normal intervals, normal axis, ST depression in lateral leads, no ST elevations. Unchanged from prior. ____________________________________________  RADIOLOGY  CXR: negative  ____________________________________________   PROCEDURES  Procedure(s) performed: None Procedures Critical Care performed:  Yes  CRITICAL CARE Performed by: Rudene Re  ?  Total critical care time: 35 min  Critical care time was exclusive of separately billable procedures and treating other patients.  Critical care was necessary to treat or prevent imminent or life-threatening deterioration.  Critical care was time spent personally by me on the following activities: development of treatment plan with patient and/or surrogate as well as nursing, discussions with consultants,  evaluation of patient's response to treatment, examination of patient, obtaining history from patient or surrogate, ordering and performing treatments and interventions, ordering and review of laboratory studies, ordering and review of radiographic studies, pulse oximetry and re-evaluation of patient's condition.  ____________________________________________   INITIAL IMPRESSION / ASSESSMENT AND PLAN / ED COURSE  65 y.o. male the history of CAD, CHF, type 2 diabetes, OSA, obesity, hypertension, hyperlipidemia who presents for evaluation of chest pain concerning for unstable angina. Patient scheduled for catheterization this Friday for stent placement. Patient at this time chest pain free after receiving nitroglycerin per EMS. Also received a full dose aspirin. Exams within normal limits. Vital signs are stable. EKG unchanged from prior. Plan to check labs and will discuss patient with cardiology.  Clinical Course  Comment By Time  Troponin 0.04 which is always elevated for patient since June. Patient is CP free. Spoke with Dr. Clayborn Bigness who recommended trying to transfer patient to [facility] as he is scheduled to undergo cath with Dr. [name] there this Friday and it seems the equipment needed to perform this cath is not available here. Patient agrees with transfer to [facility]. Will call cardiology at [facility]. Rudene Re, MD 09/04 1521  Spoke with Dr. Varney Daily, cardiology fellow who accepted patient for transfer to [facility] stepdown with Cardiology attending Dr. Haroldine Laws. He recommended starting patient on heparin gtt Rudene Re, MD 09/04 1551  Repeat troponin 0.33. Patient remains CP free. Repeat EKG unchanged from prior. Patient on heparin gtt awaiting transport to [facility]. Rudene Re, MD 09/04 1902    Pertinent labs & imaging results that were available during my care of the patient were reviewed by me and considered in my medical decision making (see chart for  details).    ____________________________________________   FINAL CLINICAL IMPRESSION(S) / ED DIAGNOSES  Final diagnoses:  Unstable angina (HCC)      NEW MEDICATIONS STARTED DURING THIS VISIT:  New Prescriptions   No medications on file     Note:  This document was prepared using Dragon voice recognition software and may include unintentional dictation errors.    Rudene Re, MD 12/20/15 [phone]

## 2015-12-20 NOTE — Progress Notes (Signed)
ANTICOAGULATION CONSULT NOTE - Initial Consult  Pharmacy Consult for heparin Indication: chest pain/ACS  No Known Allergies  Patient Measurements: Height: 6\' 2"  (188 cm) Weight: (!) 307 lb (139.3 kg) IBW/kg (Calculated) : 82.2 Heparin Dosing Weight: 113.7 kg  Vital Signs: Temp: 98.2 F (36.8 C) (09/04 1403) Temp Source: Oral (09/04 1403) BP: 136/64 (09/04 1600) Pulse Rate: 73 (09/04 1600)  Labs:  Recent Labs  12/20/15 1359  HGB 11.1*  HCT 34.0*  PLT 236  CREATININE 1.43*  TROPONINI 0.04*    Estimated Creatinine Clearance: 78.5 mL/min (by C-G formula based on SCr of 1.43 mg/dL).   Medical History: Past Medical History:  Diagnosis Date  . Abnormal levels of other serum enzymes   . Anxiety   . Bipolar disorder ([place])   . BPH (benign prostatic hyperplasia)   . CAD (coronary artery disease)   . CHF (congestive heart failure) ([place])    ?  Marland Kitchen Depression   . Diabetes mellitus, type II ([place])   . Diabetes mellitus, type II, insulin dependent ([place])   . ED (erectile dysfunction)   . GERD (gastroesophageal reflux disease)   . Gout   . History of borderline personality disorder   . Hypertension   . Hypogonadism in male   . Insomnia   . Mood disorder ([place])   . Neuropathy ([place])   . Obesity   . OSA (obstructive sleep apnea)   . PVD (peripheral vascular disease) ([place])   . Thyroid disease     Medications:  Scheduled:  . heparin  4,000 Units Intravenous Once   Infusions:  . heparin     PRN: nitroGLYCERIN  Assessment: 65yo male who presents with chest pain concerning for ACS. No previous anticoagulation at home.   Goal of Therapy:  Heparin level 0.3-0.7 units/ml Monitor platelets by anticoagulation protocol: Yes   Plan:  Give 4000 units bolus x 1 Start heparin infusion at 1450 units/hr Check anti-Xa level in 6 hours and daily while on heparin Continue to monitor H&H and platelets  Claiborne Billings m Marisa Hua 12/20/2015,4:21 PM

## 2015-12-20 NOTE — ED Triage Notes (Signed)
Pt arrived via ems for c/o chest pain - on ems arrival was 9/10 chest pain - after one NTG SL and 4 asa pt pain decreased to 0/10 - pt is scheduled for cardiac stent surgery Friday - has hx of stent placement (5) - at this time chest pain is 2/10 - pt c/o shortness of breath - denies dizziness - denies nausea/vomting

## 2015-12-20 NOTE — ED Provider Notes (Signed)
[facility] Emergency Department Provider Note  ____________________________________________  Time seen: Approximately 2:36 PM  I have reviewed the triage vital signs and the nursing notes.   HISTORY  Chief Complaint Chest Pain   HPI Dennis Mullen is a 65 y.o. male the history of CAD, CHF, type 2 diabetes, OSA, obesity, hypertension, hyperlipidemia who presents for evaluation of chest pain. Patient reports that he has been having intermittent episodes of chest pain and shortness of breath for several times a day with minimal exertion since June. Had [place] on 10/12/15 showing multi vessel disease with more severe LAD 60% stenosis. Patient is on Plavix and ASA and imdur for angina. Today was shopping and carrying a bag of sugar when he developed severe substernal chest pain that he describes as pressure, associated with shortness of breath, nausea, 1 episode of vomiting, and dizziness. Patient called EMS. Received a full dose of aspirin on route and 1 sublingual nitroglycerin. Patient now is chest pain-free. Patient is scheduled to undergo left heart catheter for stent placement at [facility] this coming Friday with Dr. [name]. Patient has a strong fh of ischemic heart disease and his father passed away at the age of 43 from an MI and both his siblings died in their 72s from MIs.Patent underwent CTA of the chest in June negative for PE or dissection. He reports that his episodes are happening with less and less exertion but never at rest.  Past Medical History:  Diagnosis Date  . Abnormal levels of other serum enzymes   . Anxiety   . Bipolar disorder ([place])   . BPH (benign prostatic hyperplasia)   . CAD (coronary artery disease)   . CHF (congestive heart failure) ([place])    ?  Mar[place] Kitchen Depression   . Diabetes mellitus, type II ([place])   . Diabetes mellitus, type II, insulin dependent ([place])   . ED (erectile dysfunction)   . GERD (gastroesophageal reflux disease)   . Gout     . History of borderline personality disorder   . Hypertension   . Hypogonadism in male   . Insomnia   . Mood disorder ([place])   . Neuropathy ([place])   . Obesity   . OSA (obstructive sleep apnea)   . PVD (peripheral vascular disease) ([place])   . Thyroid disease     Patient Active Problem List   Diagnosis Date Noted  . SOB (shortness of breath) 10/11/2015  . Elevated troponin 10/11/2015  . Chest pain at rest 09/24/2015  . Elevated PSA 07/26/2015  . Hypogonadism in male 07/26/2015  . 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 BPH with obstruction/lower urinary tract symptoms 06/29/2015  . Bipolar 1 disorder, mixed ([place]) 04/14/2015  . Borderline personality disorder 04/14/2015  . Acute renal failure ([place]) 11/15/2014  . Chest pain 11/14/2014  . Dizziness 11/05/2014  . Benign essential HTN 11/04/2014  . Combined fat and carbohydrate induced hyperlipemia 11/04/2014  . Bipolar 2 disorder ([place]) 10/27/2014  . Acquired hypothyroidism 02/03/2014  . Type 2 diabetes mellitus ([place]) 02/03/2014  . Adiposity 01/06/2014  . Obesity, diabetes, and hypertension syndrome ([place]) 12/23/2013  . Long term current use of insulin ([place]) 12/23/2013  . Type 2 diabetes mellitus with other diabetic neurological complication ([place]) 24/40/1027  . Disorder affecting the body's metabolism 12/23/2013  . Acute inflammation of the pancreas 11/18/2013  . Elevated cholesterol with elevated triglycerides 11/18/2013  . CAD in native artery 09/26/2013  . Diabetes mellitus ([place]) 09/26/2013  . Acute non-ST elevation  myocardial infarction (NSTEMI) ([place]) 09/26/2013  . Non-ST elevation (NSTEMI) myocardial infarction [facility]) 09/26/2013    Past Surgical History:  Procedure Laterality Date  . CARDIAC CATHETERIZATION N/A 10/12/2015   Procedure: Left Heart Cath and Coronary Angiography;  Surgeon: Teodoro Spray, MD;  Location: [place] [facility];  Service: Cardiovascular;  Laterality: N/A;  . CORONARY ANGIOPLASTY WITH  STENT PLACEMENT     x5 stents  . FOOT SURGERY    . MASTOIDECTOMY Right   . NASAL SEPTUM SURGERY    . penial inplant    . PENILE PROSTHESIS IMPLANT    . right mastoidectomy    . SCROTOPLASTY    . TONSILLECTOMY AND ADENOIDECTOMY      Prior to Admission medications   Medication Sig Start Date End Date Taking? Authorizing Provider  ACCU-CHEK SMARTVIEW test strip Use as directed. 10/15/14   Historical Provider, MD  allopurinol (ZYLOPRIM) 100 MG tablet Take 100 mg by mouth every morning.    Historical Provider, MD  amLODipine (NORVASC) 5 MG tablet Take 5 mg by mouth daily.  10/14/14   Historical Provider, MD  aspirin EC 81 MG tablet Take 81 mg by mouth every morning. Reported on 07/26/2015    Historical Provider, MD  clopidogrel (PLAVIX) 75 MG tablet Take 75 mg by mouth every morning. Reported on 07/26/2015    Historical Provider, MD  colchicine 0.6 MG tablet Take 0.6 mg by mouth 2 (two) times daily as needed.    Historical Provider, MD  diclofenac (VOLTAREN) 75 MG EC tablet Take 75 mg by mouth 2 (two) times daily as needed for mild pain or moderate pain.     Historical Provider, MD  divalproex (DEPAKOTE ER) 500 MG 24 hr tablet Take 1 tablet (500 mg total) by mouth 3 (three) times daily. Take 529m in morning and 1009min evening 11/02/15   JoGonzella LexMD  DULoxetine (CYMBALTA) 60 MG capsule Take 1 capsule (60 mg total) by mouth daily. 11/02/15   JoGonzella LexMD  esomeprazole (NEXIUM) 40 MG capsule Take 40 mg by mouth daily at 12 noon.  10/14/14   Historical Provider, MD  furosemide (LASIX) 40 MG tablet Take 1 tablet (40 mg total) by mouth daily. 09/25/15   RiLoletha GrayerMD  gemfibrozil (LOPID) 600 MG tablet Take 600 mg by mouth 2 (two) times daily before a meal.    Historical Provider, MD  glucose 4 GM chewable tablet Chew 1 tablet by mouth as needed for low blood sugar.    Historical Provider, MD  insulin aspart (NOVOLOG) 100 UNIT/ML injection Inject into the skin. Reported on 10/11/2015     Historical Provider, MD  Insulin Disposable Pump (V-GO 30) KIT Reported on 06/29/2015 10/07/14   Historical Provider, MD  Insulin Pen Needle (NOVOFINE) 32G X 6 MM MISC Reported on 07/26/2015 11/07/13   Historical Provider, MD  isosorbide mononitrate (IMDUR) 30 MG 24 hr tablet Take 1 tablet (30 mg total) by mouth daily. 10/12/15   VaVaughan BastaMD  levothyroxine (SYNTHROID, LEVOTHROID) 50 MCG tablet Take 50 mcg by mouth daily before breakfast.    Historical Provider, MD  metFORMIN (GLUCOPHAGE) 1000 MG tablet Take 1,000 mg by mouth 2 (two) times daily with a meal.    Historical Provider, MD  metoprolol succinate (TOPROL-XL) 50 MG 24 hr tablet Take 50 mg by mouth daily.  09/11/14   Historical Provider, MD  nitroGLYCERIN (NITROSTAT) 0.4 MG SL tablet Place 0.4 mg under the tongue every 5 (five) minutes x  3 doses as needed for chest pain.     Historical Provider, MD  pioglitazone (ACTOS) 15 MG tablet Take 15 mg by mouth every morning.     Historical Provider, MD  pregabalin (LYRICA) 100 MG capsule Take 100 mg by mouth 3 (three) times daily.    Historical Provider, MD  rOPINIRole (REQUIP) 1 MG tablet Take 1 mg by mouth at bedtime.  04/27/15   Historical Provider, MD  rosuvastatin (CRESTOR) 5 MG tablet Take 5 mg by mouth daily at 6 PM.  07/05/15   Historical Provider, MD  VICTOZA 18 MG/3ML SOPN Inject 1.8 mg into the skin daily.  09/07/14   Historical Provider, MD  vitamin C (ASCORBIC ACID) 500 MG tablet Take 500 mg by mouth daily as needed.    Historical Provider, MD  zolpidem (AMBIEN) 10 MG tablet Take 10 mg by mouth at bedtime. Reported on 07/26/2015 05/20/15   Historical Provider, MD    Allergies Review of patient's allergies indicates no known allergies.  Family History  Problem Relation Age of Onset  . Lung cancer Mother   . Heart attack Father   . Post-traumatic stress disorder Father   . Anxiety disorder Father   . Depression Father   . Heart attack Sister   . Heart attack Brother      Social History Social History  Substance Use Topics  . Smoking status: Never Smoker  . Smokeless tobacco: Never Used  . Alcohol use No    Review of Systems Constitutional: Negative for fever. + Lightheadedness Eyes: Negative for visual changes. ENT: Negative for sore throat. Cardiovascular: + chest pain. Respiratory: + shortness of breath. Gastrointestinal: Negative for abdominal pain. + N/V Genitourinary: Negative for dysuria. Musculoskeletal: Negative for back pain. Skin: Negative for rash. Neurological: Negative for headaches, weakness or numbness.  ____________________________________________   PHYSICAL EXAM:  VITAL SIGNS: ED Triage Vitals  Enc Vitals Group     BP 12/20/15 1403 (!) 146/67     Pulse Rate 12/20/15 1403 80     Resp 12/20/15 1403 20     Temp 12/20/15 1403 98.2 F (36.8 C)     Temp Source 12/20/15 1403 Oral     SpO2 12/20/15 1359 98 %     Weight 12/20/15 1404 (!) 307 lb (139.3 kg)     Height 12/20/15 1404 6' 2"  (1.88 m)     Head Circumference --      Peak Flow --      Pain Score 12/20/15 1404 2     Pain Loc --      Pain Edu? --      Excl. in [place]? --     Constitutional: Alert and oriented. Well appearing and in no apparent distress. HEENT:      Head: Normocephalic and atraumatic.         Eyes: Conjunctivae are normal. Sclera is non-icteric. EOMI. PERRL      Mouth/Throat: Mucous membranes are moist.       Neck: Supple with no signs of meningismus. Cardiovascular: Regular rate and rhythm. No murmurs, gallops, or rubs. 2+ symmetrical distal pulses are present in all extremities. No JVD. Respiratory: Normal respiratory effort. Lungs are clear to auscultation bilaterally. No wheezes, crackles, or rhonchi.  Gastrointestinal: Obese, non tender, and non distended with positive bowel sounds. No rebound or guarding. Genitourinary: No CVA tenderness. Musculoskeletal: Nontender with normal range of motion in all extremities. No edema, cyanosis, or  erythema of extremities. Neurologic: Normal speech and language. Face is symmetric.  Moving all extremities. No gross focal neurologic deficits are appreciated. Skin: Skin is warm, dry and intact. No rash noted. Psychiatric: Mood and affect are normal. Speech and behavior are normal.  ____________________________________________   LABS (all labs ordered are listed, but only abnormal results are displayed)  Labs Reviewed  BASIC METABOLIC PANEL - Abnormal; Notable for the following:       Result Value   Glucose, Bld 278 (*)    BUN 25 (*)    Creatinine, Ser 1.43 (*)    Calcium 8.8 (*)    GFR calc non Af Amer 51 (*)    GFR calc Af Amer 59 (*)    All other components within normal limits  CBC - Abnormal; Notable for the following:    RBC 3.84 (*)    Hemoglobin 11.1 (*)    HCT 34.0 (*)    RDW 15.7 (*)    All other components within normal limits  TROPONIN I - Abnormal; Notable for the following:    Troponin I 0.04 (*)    All other components within normal limits  BRAIN NATRIURETIC PEPTIDE - Abnormal; Notable for the following:    B Natriuretic Peptide 102.0 (*)    All other components within normal limits   ____________________________________________  EKG  ED ECG REPORT I, Rudene Re, the attending physician, personally viewed and interpreted this ECG.  Normal sinus rhythm, rate of 80, normal intervals, normal axis, ST depressions in lateral leads. No STE. Unchanged from prior. ____________________________________________  RADIOLOGY  CXR: Negative ____________________________________________   PROCEDURES  Procedure(s) performed: None Procedures Critical Care performed:  None ____________________________________________   INITIAL IMPRESSION / ASSESSMENT AND PLAN / ED COURSE  65 y.o. male the history of CAD, CHF, type 2 diabetes, OSA, obesity, hypertension, hyperlipidemia who presents for evaluation of chest pain concerning for unstable angina. Patient  scheduled for catheterization this Friday for stent placement. Patient at this time chest pain free after receiving nitroglycerin per EMS. Also received a full dose aspirin. Exams within normal limits. Vital signs are stable. EKG unchanged from prior. Plan to check labs and will discuss patient with cardiology.  Clinical Course  Comment By Time  Troponin 0.04 which is always elevated for patient since June. Patient is CP free. Spoke with Dr. Clayborn Bigness who recommended trying to transfer patient to [facility] as he is scheduled to undergo cath with Dr. [name] there this Friday and it seems the equipment needed to perform this cath is not available here. Patient agrees with transfer to [facility]. Will call cardiology at [facility]. Rudene Re, MD 09/04 1521  Spoke with Dr. Varney Daily, cardiology fellow who accepted patient for transfer to [facility] stepdown with Cardiology attending Dr. Haroldine Laws. He recommended starting patient on heparin gtt Rudene Re, MD 09/04 1551    Pertinent labs & imaging results that were available during my care of the patient were reviewed by me and considered in my medical decision making (see chart for details).    ____________________________________________   FINAL CLINICAL IMPRESSION(S) / ED DIAGNOSES  Final diagnoses:  Unstable angina (HCC)      NEW MEDICATIONS STARTED DURING THIS VISIT:  New Prescriptions   No medications on file     Note:  This document was prepared using Dragon voice recognition software and may include unintentional dictation errors.     Rudene Re, MD 12/20/15 [phone]

## 2016-01-10 ENCOUNTER — Ambulatory Visit: Payer: Medicare HMO

## 2016-01-13 ENCOUNTER — Encounter: Payer: Medicare Other | Attending: Internal Medicine | Admitting: *Deleted

## 2016-01-13 VITALS — Ht 73.25 in | Wt 288.3 lb

## 2016-01-13 DIAGNOSIS — I214 Non-ST elevation (NSTEMI) myocardial infarction: Secondary | ICD-10-CM

## 2016-01-13 DIAGNOSIS — Z955 Presence of coronary angioplasty implant and graft: Secondary | ICD-10-CM | POA: Diagnosis present

## 2016-01-13 NOTE — Progress Notes (Signed)
Cardiac Individual Treatment Plan  Patient Details  Name: Dennis Mullen MRN: [id number] Date of Birth: 06-Jul-1950 Referring Provider:   Flowsheet Row Cardiac Rehab from 01/13/2016 in [facility] Cardiac and Pulmonary Rehab  Referring Provider  [name]      Initial Encounter Date:  Flowsheet Row Cardiac Rehab from 01/13/2016 in [facility] Cardiac and Pulmonary Rehab  Date  01/13/16  Referring Provider  [name]      Visit Diagnosis: NSTEMI (non-ST elevated myocardial infarction) [facility])  Patient's Home Medications on Admission:  Current Outpatient Prescriptions:  .  allopurinol (ZYLOPRIM) 100 MG tablet, Take 100 mg by mouth every morning., Disp: , Rfl:  .  amLODipine (NORVASC) 5 MG tablet, Take 5 mg by mouth daily. , Disp: , Rfl:  .  aspirin EC 81 MG tablet, Take 81 mg by mouth every morning. Reported on 07/26/2015, Disp: , Rfl:  .  clopidogrel (PLAVIX) 75 MG tablet, Take 75 mg by mouth every morning. Reported on 07/26/2015, Disp: , Rfl:  .  divalproex (DEPAKOTE ER) 500 MG 24 hr tablet, Take 1 tablet (500 mg total) by mouth 3 (three) times daily. Take 523m in morning and 10062min evening (Patient taking differently: Take 500-1,000 mg by mouth 2 (two) times daily. Take 50048mn morning and 1000m26m evening), Disp: 270 tablet, Rfl: 1 .  DULoxetine (CYMBALTA) 60 MG capsule, Take 1 capsule (60 mg total) by mouth daily., Disp: 90 capsule, Rfl: 1 .  esomeprazole (NEXIUM) 40 MG capsule, Take 40 mg by mouth daily. , Disp: , Rfl:  .  fenofibrate (TRICOR) 145 MG tablet, Take by mouth., Disp: , Rfl:  .  furosemide (LASIX) 40 MG tablet, Take 1 tablet (40 mg total) by mouth daily., Disp: 30 tablet, Rfl: 0 .  glucose 4 GM chewable tablet, Chew 1 tablet by mouth as needed for low blood sugar., Disp: , Rfl:  .  Insulin Disposable Pump (V-GO 30) KIT, Reported on 06/29/2015, Disp: , Rfl:  .  isosorbide mononitrate (IMDUR) 30 MG 24 hr tablet, Take 1 tablet (30 mg total) by mouth daily. (Patient taking  differently: Take 60 mg by mouth daily. ), Disp: 30 tablet, Rfl: 0 .  levothyroxine (SYNTHROID, LEVOTHROID) 50 MCG tablet, Take 50 mcg by mouth daily before breakfast., Disp: , Rfl:  .  metFORMIN (GLUCOPHAGE) 1000 MG tablet, Take 1,000 mg by mouth 2 (two) times daily with a meal., Disp: , Rfl:  .  metoprolol succinate (TOPROL-XL) 50 MG 24 hr tablet, Take 50 mg by mouth daily. , Disp: , Rfl:  .  nitroGLYCERIN (NITROSTAT) 0.4 MG SL tablet, Place 0.4 mg under the tongue every 5 (five) minutes x 3 doses as needed for chest pain. , Disp: , Rfl:  .  pregabalin (LYRICA) 100 MG capsule, Take 100 mg by mouth 3 (three) times daily., Disp: , Rfl:  .  rOPINIRole (REQUIP) 1 MG tablet, Take 1 mg by mouth 2 (two) times daily. , Disp: , Rfl:  .  rosuvastatin (CRESTOR) 5 MG tablet, Take 5 mg by mouth daily at 6 PM. , Disp: , Rfl:  .  VICTOZA 18 MG/3ML SOPN, Inject 1.8 mg into the skin daily. , Disp: , Rfl:  .  vitamin C (ASCORBIC ACID) 500 MG tablet, Take 500 mg by mouth daily., Disp: , Rfl:  .  colchicine 0.6 MG tablet, , Disp: , Rfl:  .  diclofenac (VOLTAREN) 75 MG EC tablet, , Disp: , Rfl:  .  insulin aspart (NOVOLOG) 100 UNIT/ML injection, Inject 12-20  Units into the skin 3 (three) times daily with meals. Reported on 10/11/2015, Disp: , Rfl:  .  pioglitazone (ACTOS) 15 MG tablet, Take 15 mg by mouth every morning. , Disp: , Rfl:   Past Medical History: Past Medical History:  Diagnosis Date  . Abnormal levels of other serum enzymes   . Anxiety   . Bipolar disorder ([place])   . BPH (benign prostatic hyperplasia)   . CAD (coronary artery disease)   . CHF (congestive heart failure) ([place])    ?  Marland Kitchen Depression   . Diabetes mellitus, type II ([employer] [employer])   . Diabetes mellitus, type II, insulin dependent ([place])   . ED (erectile dysfunction)   . GERD (gastroesophageal reflux disease)   . Gout   . History of borderline personality disorder   . Hypertension   . Hypogonadism in male   . Insomnia   . Mood disorder ([place])    . Neuropathy ([place]   . Obesity   . OSA (obstructive sleep apnea)   . PVD (peripheral vascular disease) ([place])   . Thyroid disease     Tobacco Use: History  Smoking Status  . Never Smoker  Smokeless Tobacco  . Never Used    Labs: Recent Review Flowsheet Data    Labs for ITP Cardiac and Pulmonary Rehab Latest Ref Rng & Units 01/29/2014 11/14/2014 11/14/2014 09/24/2015 10/11/2015   Cholestrol 0 - 200 mg/dL 223(H) - - 152 176   LDLCALC 0 - 99 mg/dL SEE COMMENT - - 46 89   HDL >40 mg/dL 34(L) - - 48 45   Trlycerides <150 mg/dL 474(H) - - 291(H) 211(H)   Hemoglobin A1c 4.0 - 6.0 % 8.4(H) 6.5(H) 6.4(H) 6.4(H) -       Exercise Target Goals: Date: 01/13/16  Exercise Program Goal: Individual exercise prescription set with THRR, safety & activity barriers. Participant demonstrates ability to understand and report RPE using BORG scale, to self-measure pulse accurately, and to acknowledge the importance of the exercise prescription.  Exercise Prescription Goal: Starting with aerobic activity 30 plus minutes a day, 3 days per week for initial exercise prescription. Provide home exercise prescription and guidelines that participant acknowledges understanding prior to discharge.  Activity Barriers & Risk Stratification:     Activity Barriers & Cardiac Risk Stratification - 01/13/16 1811      Activity Barriers & Cardiac Risk Stratification   Comments Is scheduled for PFT.        6 Minute Walk:     6 Minute Walk    Row Name 01/13/16 1545         6 Minute Walk   Distance 1125 feet     Walk Time 6 minutes     # of Rest Breaks 0     MPH 2.13     METS 2.6     RPE 17     VO2 Peak 9.08     Symptoms No        Initial Exercise Prescription:     Initial Exercise Prescription - 01/13/16 1500      Date of Initial Exercise RX and Referring Provider   Date 01/13/16   Referring Provider Lacie Scotts   Level 2   Minutes 15   METs 2.5     Recumbant Bike   Level 2    RPM 60   Minutes 15   METs 2.5     NuStep   Level 3   Minutes 15   METs 2.5  Arm Ergometer   Level 2   Minutes 15   METs 2.5     REL-XR   Level 2   Minutes 15   METs 2.5     T5 Nustep   Level 2   Minutes 15   METs 2.5     Prescription Details   Frequency (times per week) 3   Duration Progress to 45 minutes of aerobic exercise without signs/symptoms of physical distress     Intensity   THRR 40-80% of Max Heartrate 105-139   Ratings of Perceived Exertion 11-13   Perceived Dyspnea 0-4     Progression   Progression Continue to progress workloads to maintain intensity without signs/symptoms of physical distress.     Resistance Training   Training Prescription Yes   Weight 3   Reps 10-15      Perform Capillary Blood Glucose checks as needed.  Exercise Prescription Changes:   Exercise Comments:      Exercise Comments    Row Name 01/17/16 1020           Exercise Comments First full day of exercise!  Patient was oriented to gym and equipment including functions, settings, policies, and procedures.  Patient's individual exercise prescription and treatment plan were reviewed.  All starting workloads were established based on the results of the 6 minute walk test done at initial orientation visit.  The plan for exercise progression was also introduced and progression will be customized based on patient's performance and goals.          Discharge Exercise Prescription (Final Exercise Prescription Changes):   Nutrition:  Target Goals: Understanding of nutrition guidelines, daily intake of sodium <1533m, cholesterol <2043m calories 30% from fat and 7% or less from saturated fats, daily to have 5 or more servings of fruits and vegetables.  Biometrics:     Pre Biometrics - 01/13/16 1544      Pre Biometrics   Height 6' 1.25" (1.861 m)   Weight 288 lb 4.8 oz (130.8 kg)   Waist Circumference 50.75 inches   Hip Circumference 49 inches   Waist to Hip Ratio  1.04 %   BMI (Calculated) 37.9       Nutrition Therapy Plan and Nutrition Goals:     Nutrition Therapy & Goals - 01/13/16 1803      Intervention Plan   Intervention Prescribe, educate and counsel regarding individualized specific dietary modifications aiming towards targeted core components such as weight, hypertension, lipid management, diabetes, heart failure and other comorbidities.   Expected Outcomes Short Term Goal: Understand basic principles of dietary content, such as calories, fat, sodium, cholesterol and nutrients.;Short Term Goal: A plan has been developed with personal nutrition goals set during dietitian appointment.;Long Term Goal: Adherence to prescribed nutrition plan.      Nutrition Discharge: Rate Your Plate Scores:     Nutrition Assessments - 01/13/16 1803      Rate Your Plate Scores   Pre Score 71   Pre Score % 79 %      Nutrition Goals Re-Evaluation:   Psychosocial: Target Goals: Acknowledge presence or absence of depression, maximize coping skills, provide positive support system. Participant is able to verbalize types and ability to use techniques and skills needed for reducing stress and depression.  Initial Review & Psychosocial Screening:     Initial Psych Review & Screening - 01/13/16 1807      Initial Review   Current issues with Current Depression;Current Psychotropic Meds;Current Stress Concerns   Source of  Stress Concerns Financial;Unable to participate in former interests or hobbies  Symptoms had kept from being active.. Hopes that getting back to exercise routine will help resolve this concern     Family Dynamics   Good Support System? Yes  Children and friends     Barriers   Psychosocial barriers to participate in program There are no identifiable barriers or psychosocial needs.;The patient should benefit from training in stress management and relaxation.     Screening Interventions   Interventions Encouraged to exercise       Quality of Life Scores:     Quality of Life - 01/13/16 1808      Quality of Life Scores   Health/Function Pre 20.53 %   Socioeconomic Pre 19.57 %   Psych/Spiritual Pre 20.71 %   Family Pre 21 %   GLOBAL Pre 20.42 %      PHQ-9: Recent Review Flowsheet Data    Depression screen [facility] 2/9 01/13/2016 07/30/2014   Decreased Interest 0  0   Down, Depressed, Hopeless 0 0   PHQ - 2 Score 0 0   Altered sleeping 3 -   Tired, decreased energy 3 -   Change in appetite 3 -   Feeling bad or failure about yourself  1 -   Trouble concentrating 1 -   Moving slowly or fidgety/restless 1 -   Suicidal thoughts 0 -   PHQ-9 Score 12 -      Psychosocial Evaluation and Intervention:   Psychosocial Re-Evaluation:   Vocational Rehabilitation: Provide vocational rehab assistance to qualifying candidates.   Vocational Rehab Evaluation & Intervention:     Vocational Rehab - 01/13/16 1811      Initial Vocational Rehab Evaluation & Intervention   Assessment shows need for Vocational Rehabilitation No      Education: Education Goals: Education classes will be provided on a weekly basis, covering required topics. Participant will state understanding/return demonstration of topics presented.  Learning Barriers/Preferences:     Learning Barriers/Preferences - 01/13/16 1811      Learning Barriers/Preferences   Learning Barriers None   Learning Preferences None      Education Topics: General Nutrition Guidelines/Fats and Fiber: -Group instruction provided by verbal, written material, models and posters to present the general guidelines for heart healthy nutrition. Gives an explanation and review of dietary fats and fiber.   Controlling Sodium/Reading Food Labels: -Group verbal and written material supporting the discussion of sodium use in heart healthy nutrition. Review and explanation with models, verbal and written materials for utilization of the food label.   Exercise  Physiology & Risk Factors: - Group verbal and written instruction with models to review the exercise physiology of the cardiovascular system and associated critical values. Details cardiovascular disease risk factors and the goals associated with each risk factor.   Aerobic Exercise & Resistance Training: - Gives group verbal and written discussion on the health impact of inactivity. On the components of aerobic and resistive training programs and the benefits of this training and how to safely progress through these programs.   Flexibility, Balance, General Exercise Guidelines: - Provides group verbal and written instruction on the benefits of flexibility and balance training programs. Provides general exercise guidelines with specific guidelines to those with heart or lung disease. Demonstration and skill practice provided.   Stress Management: - Provides group verbal and written instruction about the health risks of elevated stress, cause of high stress, and healthy ways to reduce stress.   Depression: - Provides group  verbal and written instruction on the correlation between heart/lung disease and depressed mood, treatment options, and the stigmas associated with seeking treatment.   Anatomy & Physiology of the Heart: - Group verbal and written instruction and models provide basic cardiac anatomy and physiology, with the coronary electrical and arterial systems. Review of: AMI, Angina, Valve disease, Heart Failure, Cardiac Arrhythmia, Pacemakers, and the ICD.   Cardiac Procedures: - Group verbal and written instruction and models to describe the testing methods done to diagnose heart disease. Reviews the outcomes of the test results. Describes the treatment choices: Medical Management, Angioplasty, or Coronary Bypass Surgery.   Cardiac Medications: - Group verbal and written instruction to review commonly prescribed medications for heart disease. Reviews the medication, class of the  drug, and side effects. Includes the steps to properly store meds and maintain the prescription regimen.   Go Sex-Intimacy & Heart Disease, Get SMART - Goal Setting: - Group verbal and written instruction through game format to discuss heart disease and the return to sexual intimacy. Provides group verbal and written material to discuss and apply goal setting through the application of the S.M.A.R.T. Method.   Other Matters of the Heart: - Provides group verbal, written materials and models to describe Heart Failure, Angina, Valve Disease, and Diabetes in the realm of heart disease. Includes description of the disease process and treatment options available to the cardiac patient.   Exercise & Equipment Safety: - Individual verbal instruction and demonstration of equipment use and safety with use of the equipment. Flowsheet Row Cardiac Rehab from 01/13/2016 in [facility] Cardiac and Pulmonary Rehab  Date  01/13/16  Educator  SB  Instruction Review Code  2- meets goals/outcomes      Infection Prevention: - Provides verbal and written material to individual with discussion of infection control including proper hand washing and proper equipment cleaning during exercise session. Flowsheet Row Cardiac Rehab from 01/13/2016 in [facility] Cardiac and Pulmonary Rehab  Date  01/13/16  Educator  SB  Instruction Review Code  2- meets goals/outcomes      Falls Prevention: - Provides verbal and written material to individual with discussion of falls prevention and safety. Flowsheet Row Cardiac Rehab from 01/13/2016 in [facility] Cardiac and Pulmonary Rehab  Date  01/13/16  Educator  SB  Instruction Review Code  2- meets goals/outcomes      Diabetes: - Individual verbal and written instruction to review signs/symptoms of diabetes, desired ranges of glucose level fasting, after meals and with exercise. Advice that pre and post exercise glucose checks will be done for 3 sessions at entry of program. Flowsheet Row  Cardiac Rehab from 01/13/2016 in [facility] Cardiac and Pulmonary Rehab  Date  01/13/16  Educator  SB  Instruction Review Code  2- meets goals/outcomes       Knowledge Questionnaire Score:     Knowledge Questionnaire Score - 01/13/16 1811      Knowledge Questionnaire Score   Pre Score 25/28      Core Components/Risk Factors/Patient Goals at Admission:     Personal Goals and Risk Factors at Admission - 01/13/16 1804      Core Components/Risk Factors/Patient Goals on Admission    Weight Management Obesity;Weight Maintenance;Yes   Goal Weight: Long Term 260 lb (117.9 kg)   Expected Outcomes Short Term: Continue to assess and modify interventions until short term weight is achieved;Long Term: Adherence to nutrition and physical activity/exercise program aimed toward attainment of established weight goal;Weight Loss: Understanding of general recommendations for a balanced  deficit meal plan, which promotes 1-2 lb weight loss per week and includes a negative energy balance of [phone] kcal/d   Sedentary Yes   Intervention Provide advice, education, support and counseling about physical activity/exercise needs.;Develop an individualized exercise prescription for aerobic and resistive training based on initial evaluation findings, risk stratification, comorbidities and participant's personal goals.   Expected Outcomes Achievement of increased cardiorespiratory fitness and enhanced flexibility, muscular endurance and strength shown through measurements of functional capacity and personal statement of participant.   Diabetes Yes   Intervention Provide education about signs/symptoms and action to take for hypo/hyperglycemia.;Provide education about proper nutrition, including hydration, and aerobic/resistive exercise prescription along with prescribed medications to achieve blood glucose in normal ranges: Fasting glucose 65-99 mg/dL   Expected Outcomes Short Term: Participant verbalizes understanding of  the signs/symptoms and immediate care of hyper/hypoglycemia, proper foot care and importance of medication, aerobic/resistive exercise and nutrition plan for blood glucose control.;Long Term: Attainment of HbA1C < 7%.  LAst A1c 7.7%,ususally 6% or under   Hypertension Yes   Intervention Provide education on lifestyle modifcations including regular physical activity/exercise, weight management, moderate sodium restriction and increased consumption of fresh fruit, vegetables, and low fat dairy, alcohol moderation, and smoking cessation.;Monitor prescription use compliance.   Expected Outcomes Short Term: Continued assessment and intervention until BP is < 140/61m HG in hypertensive participants. < 130/824mHG in hypertensive participants with diabetes, heart failure or chronic kidney disease.;Long Term: Maintenance of blood pressure at goal levels.   Lipids Yes   Intervention Provide education and support for participant on nutrition & aerobic/resistive exercise along with prescribed medications to achieve LDL <7018mHDL >36m67m Expected Outcomes Short Term: Participant states understanding of desired cholesterol values and is compliant with medications prescribed. Participant is following exercise prescription and nutrition guidelines.;Long Term: Cholesterol controlled with medications as prescribed, with individualized exercise RX and with personalized nutrition plan. Value goals: LDL < 70mg44mL > 40 mg.   Stress --  Financial stress   Intervention Offer individual and/or small group education and counseling on adjustment to heart disease, stress management and health-related lifestyle change. Teach and support self-help strategies.;Refer participants experiencing significant psychosocial distress to appropriate mental health specialists for further evaluation and treatment. When possible, include family members and significant others in education/counseling sessions.   Expected Outcomes Short Term:  Participant demonstrates changes in health-related behavior, relaxation and other stress management skills, ability to obtain effective social support, and compliance with psychotropic medications if prescribed.;Long Term: Emotional wellbeing is indicated by absence of clinically significant psychosocial distress or social isolation.      Core Components/Risk Factors/Patient Goals Review:    Core Components/Risk Factors/Patient Goals at Discharge (Final Review):    ITP Comments:     ITP Comments    Row Name 01/13/16 1754           ITP Comments Initial ITP created during medical review and evaluation. Diagnosis documentation found in [facility]ARE EVERY[facility] 12/20/2015 Hospital Summary          Comments:

## 2016-01-13 NOTE — Patient Instructions (Signed)
Patient Instructions  Patient Details  Name: Dennis Mullen MRN: [id number] Date of Birth: Nov 27, 1950 Referring Provider:  Corey Skains, MD  Below are the personal goals you chose as well as exercise and nutrition goals. Our goal is to help you keep on track towards obtaining and maintaining your goals. We will be discussing your progress on these goals with you throughout the program.  Initial Exercise Prescription:     Initial Exercise Prescription - 01/13/16 1500      Date of Initial Exercise RX and Referring Provider   Date 01/13/16   Referring Provider Lacie Scotts   Level 2   Minutes 15   METs 2.5     Recumbant Bike   Level 2   RPM 60   Minutes 15   METs 2.5     NuStep   Level 3   Minutes 15   METs 2.5     Arm Ergometer   Level 2   Minutes 15   METs 2.5     REL-XR   Level 2   Minutes 15   METs 2.5     T5 Nustep   Level 2   Minutes 15   METs 2.5     Prescription Details   Frequency (times per week) 3   Duration Progress to 45 minutes of aerobic exercise without signs/symptoms of physical distress     Intensity   THRR 40-80% of Max Heartrate 105-139   Ratings of Perceived Exertion 11-13   Perceived Dyspnea 0-4     Progression   Progression Continue to progress workloads to maintain intensity without signs/symptoms of physical distress.     Resistance Training   Training Prescription Yes   Weight 3   Reps 10-15      Exercise Goals: Frequency: Be able to perform aerobic exercise three times per week working toward 3-5 days per week.  Intensity: Work with a perceived exertion of 11 (fairly light) - 15 (hard) as tolerated. Follow your new exercise prescription and watch for changes in prescription as you progress with the program. Changes will be reviewed with you when they are made.  Duration: You should be able to do 30 minutes of continuous aerobic exercise in addition to a 5 minute warm-up and a 5 minute cool-down  routine.  Nutrition Goals: Your personal nutrition goals will be established when you do your nutrition analysis with the dietician.  The following are nutrition guidelines to follow: Cholesterol < 200mg /day Sodium < 1500mg /day Fiber: Men over 50 yrs - 30 grams per day  Personal Goals:     Personal Goals and Risk Factors at Admission - 01/13/16 1804      Core Components/Risk Factors/Patient Goals on Admission    Weight Management Obesity;Weight Maintenance;Yes   Goal Weight: Long Term 260 lb (117.9 kg)   Expected Outcomes Short Term: Continue to assess and modify interventions until short term weight is achieved;Long Term: Adherence to nutrition and physical activity/exercise program aimed toward attainment of established weight goal;Weight Loss: Understanding of general recommendations for a balanced deficit meal plan, which promotes 1-2 lb weight loss per week and includes a negative energy balance of [phone] kcal/d   Sedentary Yes   Intervention Provide advice, education, support and counseling about physical activity/exercise needs.;Develop an individualized exercise prescription for aerobic and resistive training based on initial evaluation findings, risk stratification, comorbidities and participant's personal goals.   Expected Outcomes Achievement of increased cardiorespiratory fitness and enhanced flexibility, muscular endurance  and strength shown through measurements of functional capacity and personal statement of participant.   Diabetes Yes   Intervention Provide education about signs/symptoms and action to take for hypo/hyperglycemia.;Provide education about proper nutrition, including hydration, and aerobic/resistive exercise prescription along with prescribed medications to achieve blood glucose in normal ranges: Fasting glucose 65-99 mg/dL   Expected Outcomes Short Term: Participant verbalizes understanding of the signs/symptoms and immediate care of hyper/hypoglycemia, proper  foot care and importance of medication, aerobic/resistive exercise and nutrition plan for blood glucose control.;Long Term: Attainment of HbA1C < 7%.  LAst A1c 7.7%,ususally 6% or under   Hypertension Yes   Intervention Provide education on lifestyle modifcations including regular physical activity/exercise, weight management, moderate sodium restriction and increased consumption of fresh fruit, vegetables, and low fat dairy, alcohol moderation, and smoking cessation.;Monitor prescription use compliance.   Expected Outcomes Short Term: Continued assessment and intervention until BP is < 140/75mm HG in hypertensive participants. < 130/105mm HG in hypertensive participants with diabetes, heart failure or chronic kidney disease.;Long Term: Maintenance of blood pressure at goal levels.   Lipids Yes   Intervention Provide education and support for participant on nutrition & aerobic/resistive exercise along with prescribed medications to achieve LDL 70mg , HDL >40mg .   Expected Outcomes Short Term: Participant states understanding of desired cholesterol values and is compliant with medications prescribed. Participant is following exercise prescription and nutrition guidelines.;Long Term: Cholesterol controlled with medications as prescribed, with individualized exercise RX and with personalized nutrition plan. Value goals: LDL < 70mg , HDL > 40 mg.   Stress --  Financial stress   Intervention Offer individual and/or small group education and counseling on adjustment to heart disease, stress management and health-related lifestyle change. Teach and support self-help strategies.;Refer participants experiencing significant psychosocial distress to appropriate mental health specialists for further evaluation and treatment. When possible, include family members and significant others in education/counseling sessions.   Expected Outcomes Short Term: Participant demonstrates changes in health-related behavior,  relaxation and other stress management skills, ability to obtain effective social support, and compliance with psychotropic medications if prescribed.;Long Term: Emotional wellbeing is indicated by absence of clinically significant psychosocial distress or social isolation.      Tobacco Use Initial Evaluation: History  Smoking Status  . Never Smoker  Smokeless Tobacco  . Never Used    Copy of goals given to participant.

## 2016-01-17 ENCOUNTER — Encounter: Payer: Medicare Other | Attending: Internal Medicine | Admitting: *Deleted

## 2016-01-17 DIAGNOSIS — Z955 Presence of coronary angioplasty implant and graft: Secondary | ICD-10-CM | POA: Insufficient documentation

## 2016-01-17 DIAGNOSIS — I214 Non-ST elevation (NSTEMI) myocardial infarction: Secondary | ICD-10-CM

## 2016-01-17 LAB — GLUCOSE, CAPILLARY
GLUCOSE-CAPILLARY: 80 mg/dL (ref 65–99)
GLUCOSE-CAPILLARY: 83 mg/dL (ref 65–99)
GLUCOSE-CAPILLARY: 100 mg/dL — AB (ref 65–99)
Glucose-Capillary: 91 mg/dL (ref 65–99)

## 2016-01-17 NOTE — Progress Notes (Addendum)
Daily Session Note  Patient Details  Name: Dennis Mullen MRN: [id number] Date of Birth: 26-Aug-1950 Referring Provider:   Flowsheet Row Cardiac Rehab from 01/13/2016 in [facility] Cardiac and Pulmonary Rehab  Referring Provider  [name]      Encounter Date: 01/17/2016  Check In:     Session Check In - 01/17/16 0811      Check-In   Location [facility]-Cardiac & Pulmonary Rehab   Staff Present Heath Lark, RN, BSN, Laveda Norman, BS, ACSM CEP, Exercise Physiologist;Karmyn Lowman [employer], [place], ACSM RCEP, Exercise Physiologist   Supervising physician immediately available to respond to emergencies See telemetry face sheet for immediately available ER MD   Medication changes reported     No   Fall or balance concerns reported    No   Warm-up and Cool-down Performed on first and last piece of equipment   Resistance Training Performed Yes   VAD Patient? No     Pain Assessment   Currently in Pain? No/denies   Multiple Pain Sites No         Goals Met:  Exercise tolerated well Personal goals reviewed No report of cardiac concerns or symptoms Strength training completed today  Goals Unmet:  Not Applicable  Comments: First full day of exercise!  Patient was oriented to gym and equipment including functions, settings, policies, and procedures.  Patient's individual exercise prescription and treatment plan were reviewed.  All starting workloads were established based on the results of the 6 minute walk test done at initial orientation visit.  The plan for exercise progression was also introduced and progression will be customized based on patient's performance and goals.    Dr. Emily Filbert is Medical Director for [place] and [facility].

## 2016-01-19 ENCOUNTER — Encounter: Payer: Medicare Other | Admitting: *Deleted

## 2016-01-19 DIAGNOSIS — I214 Non-ST elevation (NSTEMI) myocardial infarction: Secondary | ICD-10-CM

## 2016-01-19 DIAGNOSIS — Z955 Presence of coronary angioplasty implant and graft: Secondary | ICD-10-CM | POA: Diagnosis not present

## 2016-01-19 DIAGNOSIS — R0602 Shortness of breath: Secondary | ICD-10-CM | POA: Diagnosis not present

## 2016-01-19 LAB — GLUCOSE, CAPILLARY
GLUCOSE-CAPILLARY: 104 mg/dL — AB (ref 65–99)
Glucose-Capillary: 89 mg/dL (ref 65–99)
Glucose-Capillary: 67 mg/dL (ref 65–99)
Glucose-Capillary: 98 mg/dL (ref 65–99)

## 2016-01-19 NOTE — Progress Notes (Addendum)
Daily Session Note  Patient Details  Name: Dennis Mullen MRN: [id number] Date of Birth: 1951/04/06 Referring Provider:   Flowsheet Row Cardiac Rehab from 01/13/2016 in [facility] Cardiac and Pulmonary Rehab  Referring Provider  [name]      Encounter Date: 01/19/2016  Check In:     Session Check In - 01/19/16 0915      Check-In   Location [facility]-Cardiac & Pulmonary Rehab   Staff Present Alberteen Sam, MA, ACSM RCEP, Exercise Physiologist;Amanda Oletta Darter, BA, ACSM CEP, Exercise Physiologist;Carroll Enterkin, RN, BSN   Supervising physician immediately available to respond to emergencies See telemetry face sheet for immediately available ER MD   Medication changes reported     No   Fall or balance concerns reported    No   Warm-up and Cool-down Performed on first and last piece of equipment   Resistance Training Performed Yes   VAD Patient? No     Pain Assessment   Currently in Pain? No/denies   Multiple Pain Sites No         Goals Met:  Independence with exercise equipment Exercise tolerated well No report of cardiac concerns or symptoms Strength training completed today  Goals Unmet:  Not Applicable  Comments: Pt able to follow exercise prescription today without complaint.  Will continue to monitor for progression.  His blood sugars have been low at the beginning of exercise, but he is able to get high enough to exercise after taking glucose tablets.  Today, he was low again after exercise. (64)  He was encouraged to contact his endocrinolgist about holding his insulin until after exercise.  We will follow up on  Friday.    Dr. Emily Filbert is Medical Director for [place] and [facility].

## 2016-01-21 ENCOUNTER — Encounter: Payer: Medicare Other | Admitting: *Deleted

## 2016-01-21 DIAGNOSIS — Z955 Presence of coronary angioplasty implant and graft: Secondary | ICD-10-CM | POA: Diagnosis not present

## 2016-01-21 DIAGNOSIS — I214 Non-ST elevation (NSTEMI) myocardial infarction: Secondary | ICD-10-CM

## 2016-01-21 LAB — GLUCOSE, CAPILLARY
Glucose-Capillary: 108 mg/dL — ABNORMAL HIGH (ref 65–99)
Glucose-Capillary: 121 mg/dL — ABNORMAL HIGH (ref 65–99)

## 2016-01-21 NOTE — Progress Notes (Signed)
Daily Session Note  Patient Details  Name: Dennis Mullen MRN: [id number] Date of Birth: [birth year] Referring Provider:   Flowsheet Row Cardiac Rehab from 01/13/2016 in [facility] Cardiac and Pulmonary Rehab  Referring Provider  Dennis Mullen      Encounter Date: 01/21/2016  Check In:     Session Check In - 01/21/16 0925      Check-In   Location [facility]-Cardiac & Pulmonary Rehab   Staff Present Gerlene Burdock, RN, Levie Heritage, MA, ACSM RCEP, Exercise Physiologist;Laureen Janell Quiet, RRT, Respiratory Therapist   Supervising physician immediately available to respond to emergencies See telemetry face sheet for immediately available ER MD   Medication changes reported     No   Fall or balance concerns reported    No   Warm-up and Cool-down Performed on first and last piece of equipment   Resistance Training Performed Yes   VAD Patient? No     Pain Assessment   Currently in Pain? No/denies   Multiple Pain Sites No         Goals Met:  Independence with exercise equipment Exercise tolerated well No report of cardiac concerns or symptoms Strength training completed today  Goals Unmet:  Not Applicable  Comments: Pt able to follow exercise prescription today without complaint.  Will continue to monitor for progression. Switched out treadmill for bike today.  Dennis Mullen liked the bike much better. Reviewed home exercise with pt today.  Pt plans to walk as able and to go to [employer] with his Silver [employer]neakers for exercise.  Reviewed THR, pulse, RPE, sign and symptoms, NTG use, and when to call 911 or MD.  Also discussed weather considerations and indoor options.  Pt voiced understanding.    Dr. Emily Filbert is Medical Director for [name] and [facility].

## 2016-01-24 ENCOUNTER — Encounter: Payer: Medicare Other | Admitting: *Deleted

## 2016-01-24 DIAGNOSIS — Z955 Presence of coronary angioplasty implant and graft: Secondary | ICD-10-CM | POA: Diagnosis not present

## 2016-01-24 DIAGNOSIS — I214 Non-ST elevation (NSTEMI) myocardial infarction: Secondary | ICD-10-CM

## 2016-01-24 LAB — GLUCOSE, CAPILLARY
GLUCOSE-CAPILLARY: 134 mg/dL — AB (ref 65–99)
GLUCOSE-CAPILLARY: 115 mg/dL — AB (ref 65–99)

## 2016-01-24 NOTE — Progress Notes (Signed)
Daily Session Note  Patient Details  Name: Chatham Howington MRN: [id number] Date of Birth: 06/28/1950 Referring Provider:   Flowsheet Row Cardiac Rehab from 01/13/2016 in [facility] Cardiac and Pulmonary Rehab  Referring Provider  [name]      Encounter Date: 01/24/2016  Check In:     Session Check In - 01/24/16 0755      Check-In   Location [facility]-Cardiac & Pulmonary Rehab   Staff Present Alberteen Sam, MA, ACSM RCEP, Exercise Physiologist;Kelly Amedeo Plenty, BS, ACSM CEP, Exercise Physiologist;Carroll Enterkin, RN, BSN   Supervising physician immediately available to respond to emergencies See telemetry face sheet for immediately available ER MD   Medication changes reported     No   Fall or balance concerns reported    No   Warm-up and Cool-down Performed on first and last piece of equipment   Resistance Training Performed Yes   VAD Patient? No     Pain Assessment   Currently in Pain? No/denies   Multiple Pain Sites No         Goals Met:  Independence with exercise equipment Exercise tolerated well No report of cardiac concerns or symptoms Strength training completed today  Goals Unmet:  Not Applicable  Comments: Pt able to follow exercise prescription today without complaint.  Will continue to monitor for progression.    Dr. Emily Filbert is Medical Director for [place] and [facility].

## 2016-01-26 ENCOUNTER — Encounter: Payer: Self-pay | Admitting: *Deleted

## 2016-01-26 DIAGNOSIS — I214 Non-ST elevation (NSTEMI) myocardial infarction: Secondary | ICD-10-CM

## 2016-01-26 NOTE — Progress Notes (Signed)
Cardiac Individual Treatment Plan  Patient Details  Name: Dennis Mullen MRN: [id number] Date of Birth: April 15, 1951 Referring Provider:   Flowsheet Row Cardiac Rehab from 01/13/2016 in [facility] Cardiac and Pulmonary Rehab  Referring Provider  [name]      Initial Encounter Date:  Flowsheet Row Cardiac Rehab from 01/13/2016 in [facility] Cardiac and Pulmonary Rehab  Date  01/13/16  Referring Provider  [name]      Visit Diagnosis: NSTEMI (non-ST elevated myocardial infarction) [facility])  Patient's Home Medications on Admission:  Current Outpatient Prescriptions:  .  allopurinol (ZYLOPRIM) 100 MG tablet, Take 100 mg by mouth every morning., Disp: , Rfl:  .  amLODipine (NORVASC) 5 MG tablet, Take 5 mg by mouth daily. , Disp: , Rfl:  .  aspirin EC 81 MG tablet, Take 81 mg by mouth every morning. Reported on 07/26/2015, Disp: , Rfl:  .  clopidogrel (PLAVIX) 75 MG tablet, Take 75 mg by mouth every morning. Reported on 07/26/2015, Disp: , Rfl:  .  colchicine 0.6 MG tablet, , Disp: , Rfl:  .  diclofenac (VOLTAREN) 75 MG EC tablet, , Disp: , Rfl:  .  divalproex (DEPAKOTE ER) 500 MG 24 hr tablet, Take 1 tablet (500 mg total) by mouth 3 (three) times daily. Take 550m in morning and 10073min evening (Patient taking differently: Take 500-1,000 mg by mouth 2 (two) times daily. Take 50037mn morning and 1000m77m evening), Disp: 270 tablet, Rfl: 1 .  DULoxetine (CYMBALTA) 60 MG capsule, Take 1 capsule (60 mg total) by mouth daily., Disp: 90 capsule, Rfl: 1 .  esomeprazole (NEXIUM) 40 MG capsule, Take 40 mg by mouth daily. , Disp: , Rfl:  .  fenofibrate (TRICOR) 145 MG tablet, Take by mouth., Disp: , Rfl:  .  furosemide (LASIX) 40 MG tablet, Take 1 tablet (40 mg total) by mouth daily., Disp: 30 tablet, Rfl: 0 .  glucose 4 GM chewable tablet, Chew 1 tablet by mouth as needed for low blood sugar., Disp: , Rfl:  .  insulin aspart (NOVOLOG) 100 UNIT/ML injection, Inject 12-20 Units into the skin 3 (three) times  daily with meals. Reported on 10/11/2015, Disp: , Rfl:  .  Insulin Disposable Pump (V-GO 30) KIT, Reported on 06/29/2015, Disp: , Rfl:  .  isosorbide mononitrate (IMDUR) 30 MG 24 hr tablet, Take 1 tablet (30 mg total) by mouth daily. (Patient taking differently: Take 60 mg by mouth daily. ), Disp: 30 tablet, Rfl: 0 .  levothyroxine (SYNTHROID, LEVOTHROID) 50 MCG tablet, Take 50 mcg by mouth daily before breakfast., Disp: , Rfl:  .  metFORMIN (GLUCOPHAGE) 1000 MG tablet, Take 1,000 mg by mouth 2 (two) times daily with a meal., Disp: , Rfl:  .  metoprolol succinate (TOPROL-XL) 50 MG 24 hr tablet, Take 50 mg by mouth daily. , Disp: , Rfl:  .  nitroGLYCERIN (NITROSTAT) 0.4 MG SL tablet, Place 0.4 mg under the tongue every 5 (five) minutes x 3 doses as needed for chest pain. , Disp: , Rfl:  .  pioglitazone (ACTOS) 15 MG tablet, Take 15 mg by mouth every morning. , Disp: , Rfl:  .  pregabalin (LYRICA) 100 MG capsule, Take 100 mg by mouth 3 (three) times daily., Disp: , Rfl:  .  rOPINIRole (REQUIP) 1 MG tablet, Take 1 mg by mouth 2 (two) times daily. , Disp: , Rfl:  .  rosuvastatin (CRESTOR) 5 MG tablet, Take 5 mg by mouth daily at 6 PM. , Disp: , Rfl:  .  VICTOZA 18 MG/3ML SOPN, Inject 1.8 mg into the skin daily. , Disp: , Rfl:  .  vitamin C (ASCORBIC ACID) 500 MG tablet, Take 500 mg by mouth daily., Disp: , Rfl:   Past Medical History: Past Medical History:  Diagnosis Date  . Abnormal levels of other serum enzymes   . Anxiety   . Bipolar disorder ([place])   . BPH (benign prostatic hyperplasia)   . CAD (coronary artery disease)   . CHF (congestive heart failure) ([place])    ?  Marland Kitchen Depression   . Diabetes mellitus, type II ([place])   . Diabetes mellitus, type II, insulin dependent ([place])   . ED (erectile dysfunction)   . GERD (gastroesophageal reflux disease)   . Gout   . History of borderline personality disorder   . Hypertension   . Hypogonadism in male   . Insomnia   . Mood disorder ([place])   .  Neuropathy ([place])   . Obesity   . OSA (obstructive sleep apnea)   . PVD (peripheral vascular disease) ([place])   . Thyroid disease     Tobacco Use: History  Smoking Status  . Never Smoker  Smokeless Tobacco  . Never Used    Labs: Recent Review Flowsheet Data    Labs for ITP Cardiac and Pulmonary Rehab Latest Ref Rng & Units 01/29/2014 11/14/2014 11/14/2014 09/24/2015 10/11/2015   Cholestrol 0 - 200 mg/dL 223(H) - - 152 176   LDLCALC 0 - 99 mg/dL SEE COMMENT - - 46 89   HDL >40 mg/dL 34(L) - - 48 45   Trlycerides <150 mg/dL 474(H) - - 291(H) 211(H)   Hemoglobin A1c 4.0 - 6.0 % 8.4(H) 6.5(H) 6.4(H) 6.4(H) -       Exercise Target Goals:    Exercise Program Goal: Individual exercise prescription set with THRR, safety & activity barriers. Participant demonstrates ability to understand and report RPE using BORG scale, to self-measure pulse accurately, and to acknowledge the importance of the exercise prescription.  Exercise Prescription Goal: Starting with aerobic activity 30 plus minutes a day, 3 days per week for initial exercise prescription. Provide home exercise prescription and guidelines that participant acknowledges understanding prior to discharge.  Activity Barriers & Risk Stratification:     Activity Barriers & Cardiac Risk Stratification - 01/13/16 1811      Activity Barriers & Cardiac Risk Stratification   Comments Is scheduled for PFT.        6 Minute Walk:     6 Minute Walk    Row Name 01/13/16 1545         6 Minute Walk   Distance 1125 feet     Walk Time 6 minutes     # of Rest Breaks 0     MPH 2.13     METS 2.6     RPE 17     VO2 Peak 9.08     Symptoms No        Initial Exercise Prescription:     Initial Exercise Prescription - 01/13/16 1500      Date of Initial Exercise RX and Referring Provider   Date 01/13/16   Referring Provider Lacie Scotts   Level 2   Minutes 15   METs 2.5     Recumbant Bike   Level 2   RPM 60   Minutes  15   METs 2.5     NuStep   Level 3   Minutes 15   METs 2.5  Arm Ergometer   Level 2   Minutes 15   METs 2.5     REL-XR   Level 2   Minutes 15   METs 2.5     T5 Nustep   Level 2   Minutes 15   METs 2.5     Prescription Details   Frequency (times per week) 3   Duration Progress to 45 minutes of aerobic exercise without signs/symptoms of physical distress     Intensity   THRR 40-80% of Max Heartrate 105-139   Ratings of Perceived Exertion 11-13   Perceived Dyspnea 0-4     Progression   Progression Continue to progress workloads to maintain intensity without signs/symptoms of physical distress.     Resistance Training   Training Prescription Yes   Weight 3   Reps 10-15      Perform Capillary Blood Glucose checks as needed.  Exercise Prescription Changes:     Exercise Prescription Changes    Row Name 01/19/16 1400 01/21/16 0900           Exercise Review   Progression Yes Yes        Response to Exercise   Blood Pressure (Admit) 124/72  -      Blood Pressure (Exercise) 134/70  -      Blood Pressure (Exit) 126/64  -      Heart Rate (Admit) 87 bpm  -      Heart Rate (Exercise) 110 bpm  -      Heart Rate (Exit) 78 bpm  -      Rating of Perceived Exertion (Exercise) 14  -      Symptoms SOB on treadmill SOB on treadmill      Comments  - Home Exercise Guidelines given 01/21/16      Duration Progress to 45 minutes of aerobic exercise without signs/symptoms of physical distress Progress to 45 minutes of aerobic exercise without signs/symptoms of physical distress      Intensity THRR unchanged THRR unchanged        Progression   Progression Continue to progress workloads to maintain intensity without signs/symptoms of physical distress. Continue to progress workloads to maintain intensity without signs/symptoms of physical distress.      Average METs 1.87 1.87        Resistance Training   Training Prescription Yes Yes      Weight 4 lbs 4 lbs      Reps  10-12 10-12        Interval Training   Interval Training No No        Treadmill   MPH 0.8 0.8      Grade 0 0      Minutes 15 15      METs 1.6 1.6        Cybex   Level  - 4      RPM  - 50      Minutes  - 15        T5 Nustep   Level 2 2      Minutes 15 15        Biostep-RELP   Level 2 2      Watts -  50 spm -  50 spm      Minutes 15 15      METs 2 2        Home Exercise Plan   Plans to continue exercise at  [facility] [employer] [facility] (comment)  walking and [employer])  Frequency  - Add 2 additional days to program exercise sessions.         Exercise Comments:     Exercise Comments    Row Name 01/17/16 1020 01/19/16 1448 01/21/16 0926       Exercise Comments First full day of exercise!  Patient was oriented to gym and equipment including functions, settings, policies, and procedures.  Patient's individual exercise prescription and treatment plan were reviewed.  All starting workloads were established based on the results of the 6 minute walk test done at initial orientation visit.  The plan for exercise progression was also introduced and progression will be customized based on patient's performance and goals. Dennis Mullen is off to a good start with exercise.  His blood sugars have been low at the beginning of exercise, but he is able to get high enough to exercise after taking glucose tablets.  Mullen, he was low again after exercise. (64)  He was encouraged to contact his endocrinolgist about holding his insulin until after exercise.  We will follow up on  Friday.  We will continue to monitor for progression. Switched out treadmill for bike Mullen.  Dennis Mullen liked the bike much better.        Discharge Exercise Prescription (Final Exercise Prescription Changes):     Exercise Prescription Changes - 01/21/16 0900      Exercise Review   Progression Yes     Response to Exercise   Symptoms SOB on treadmill   Comments Home Exercise Guidelines given 01/21/16   Duration  Progress to 45 minutes of aerobic exercise without signs/symptoms of physical distress   Intensity THRR unchanged     Progression   Progression Continue to progress workloads to maintain intensity without signs/symptoms of physical distress.   Average METs 1.87     Resistance Training   Training Prescription Yes   Weight 4 lbs   Reps 10-12     Interval Training   Interval Training No     Treadmill   MPH 0.8   Grade 0   Minutes 15   METs 1.6     Cybex   Level 4   RPM 50   Minutes 15     T5 Nustep   Level 2   Minutes 15     Biostep-RELP   Level 2   Watts --  50 spm   Minutes 15   METs 2     Home Exercise Plan   Plans to continue exercise at [employer] (comment)  walking and [employer] (Silver Sneakers)   Frequency Add 2 additional days to program exercise sessions.      Nutrition:  Target Goals: Understanding of nutrition guidelines, daily intake of sodium <1528m, cholesterol <2090m calories 30% from fat and 7% or less from saturated fats, daily to have 5 or more servings of fruits and vegetables.  Biometrics:     Pre Biometrics - 01/13/16 1544      Pre Biometrics   Height 6' 1.25" (1.861 m)   Weight 288 lb 4.8 oz (130.8 kg)   Waist Circumference 50.75 inches   Hip Circumference 49 inches   Waist to Hip Ratio 1.04 %   BMI (Calculated) 37.9       Nutrition Therapy Plan and Nutrition Goals:     Nutrition Therapy & Goals - 01/13/16 1803      Intervention Plan   Intervention Prescribe, educate and counsel regarding individualized specific dietary modifications aiming towards targeted core components such as weight, hypertension,  lipid management, diabetes, heart failure and other comorbidities.   Expected Outcomes Short Term Goal: Understand basic principles of dietary content, such as calories, fat, sodium, cholesterol and nutrients.;Short Term Goal: A plan has been developed with personal nutrition goals set during dietitian  appointment.;Long Term Goal: Adherence to prescribed nutrition plan.      Nutrition Discharge: Rate Your Plate Scores:     Nutrition Assessments - 01/13/16 1803      Rate Your Plate Scores   Pre Score 71   Pre Score % 79 %      Nutrition Goals Re-Evaluation:   Psychosocial: Target Goals: Acknowledge presence or absence of depression, maximize coping skills, provide positive support system. Participant is able to verbalize types and ability to use techniques and skills needed for reducing stress and depression.  Initial Review & Psychosocial Screening:     Initial Psych Review & Screening - 01/13/16 1807      Initial Review   Current issues with Current Depression;Current Psychotropic Meds;Current Stress Concerns   Source of Stress Concerns Financial;Unable to participate in former interests or hobbies  Symptoms had kept from being active.. Hopes that getting back to exercise routine will help resolve this concern     Family Dynamics   Good Support System? Yes  Children and friends     Barriers   Psychosocial barriers to participate in program There are no identifiable barriers or psychosocial needs.;The patient should benefit from training in stress management and relaxation.     Screening Interventions   Interventions Encouraged to exercise      Quality of Life Scores:     Quality of Life - 01/13/16 1808      Quality of Life Scores   Health/Function Pre 20.53 %   Socioeconomic Pre 19.57 %   Psych/Spiritual Pre 20.71 %   Family Pre 21 %   GLOBAL Pre 20.42 %      PHQ-9: Recent Review Flowsheet Data    Depression screen [facility] 2/9 01/13/2016 07/30/2014   Decreased Interest 0  0   Down, Depressed, Hopeless 0 0   PHQ - 2 Score 0 0   Altered sleeping 3 -   Tired, decreased energy 3 -   Change in appetite 3 -   Feeling bad or failure about yourself  1 -   Trouble concentrating 1 -   Moving slowly or fidgety/restless 1 -   Suicidal thoughts 0 -   PHQ-9 Score  12 -      Psychosocial Evaluation and Intervention:     Psychosocial Evaluation - 01/24/16 1013      Psychosocial Evaluation & Interventions   Interventions Encouraged to exercise with the program and follow exercise prescription;Stress management education   Comments Counselor met with Mr. Vanriper Dennis Mullen for initial psychosocial evaluation.  Dennis Mullen is a 65 year old who has multiple health issues including Cardiac; Diabetes Type 2 and Bipolar Disorder.  He has (2) Adult Children and their families who live close by and Dennis Mullen is also a part of a church community.  He also has sleep apnea and uses a CPAP nightly but Dennis Mullen reports he may only sleep approx. 6 hours per night 3-4 nights per week and otherwise does not sleep well at all.  He states his Dr. has prescribed Ambien PRN to help with this, but Dennis Mullen "prefers not to take additional medications."  His appetite is up and down with his endocrinologist reportedly calling him a "carbaphobic."  Dennis Mullen was diagnosed with Bi-Polar Disorder  shortly after his spouse left him in 2007 and attempted suicide 5X afterwards.  The latest attempt was reported to be 4-5 years ago.  He states that his current medications keep his mood stable for the most part.  He stated he is somewhat anxious and his mood is above average currently.  His stressors consist of his health, finances and some stress in relationships in his life.  He has goals to increase his stamina and strength and to exercise consistently while in this program.  Counselor encouraged Dennis Mullen to take his medication for sleep as needed per Dr. recommendations.  He also would benefit from a Divorce Care support group to help with the unresolved grief from his wife leaving him almost 10 years ago.  Counselor shared this information with Dennis Mullen and will be following with him throughout the course of this program.     Continued Psychosocial Services Needed Yes  Follow re: sleep and support group.  He will also benefit from  the psychoeducational components of this program - especially stress management.        Psychosocial Re-Evaluation:   Vocational Rehabilitation: Provide vocational rehab assistance to qualifying candidates.   Vocational Rehab Evaluation & Intervention:     Vocational Rehab - 01/13/16 1811      Initial Vocational Rehab Evaluation & Intervention   Assessment shows need for Vocational Rehabilitation No      Education: Education Goals: Education classes will be provided on a weekly basis, covering required topics. Participant will state understanding/return demonstration of topics presented.  Learning Barriers/Preferences:     Learning Barriers/Preferences - 01/13/16 1811      Learning Barriers/Preferences   Learning Barriers None   Learning Preferences None      Education Topics: General Nutrition Guidelines/Fats and Fiber: -Group instruction provided by verbal, written material, models and posters to present the general guidelines for heart healthy nutrition. Gives an explanation and review of dietary fats and fiber.   Controlling Sodium/Reading Food Labels: -Group verbal and written material supporting the discussion of sodium use in heart healthy nutrition. Review and explanation with models, verbal and written materials for utilization of the food label.   Exercise Physiology & Risk Factors: - Group verbal and written instruction with models to review the exercise physiology of the cardiovascular system and associated critical values. Details cardiovascular disease risk factors and the goals associated with each risk factor. Flowsheet Row Cardiac Rehab from 01/24/2016 in [facility] Cardiac and Pulmonary Rehab  Date  01/17/16  Educator  [facility]  Instruction Review Code  2- meets goals/outcomes      Aerobic Exercise & Resistance Training: - Gives group verbal and written discussion on the health impact of inactivity. On the components of aerobic and resistive training programs  and the benefits of this training and how to safely progress through these programs. Flowsheet Row Cardiac Rehab from 01/24/2016 in [facility] Cardiac and Pulmonary Rehab  Date  01/19/16  Educator  [facility]  Instruction Review Code  2- meets goals/outcomes      Flexibility, Balance, General Exercise Guidelines: - Provides group verbal and written instruction on the benefits of flexibility and balance training programs. Provides general exercise guidelines with specific guidelines to those with heart or lung disease. Demonstration and skill practice provided. Flowsheet Row Cardiac Rehab from 01/24/2016 in [facility] Cardiac and Pulmonary Rehab  Date  01/24/16  Educator  [facility]  Instruction Review Code  2- meets goals/outcomes      Stress Management: - Provides group verbal and written  instruction about the health risks of elevated stress, cause of high stress, and healthy ways to reduce stress.   Depression: - Provides group verbal and written instruction on the correlation between heart/lung disease and depressed mood, treatment options, and the stigmas associated with seeking treatment.   Anatomy & Physiology of the Heart: - Group verbal and written instruction and models provide basic cardiac anatomy and physiology, with the coronary electrical and arterial systems. Review of: AMI, Angina, Valve disease, Heart Failure, Cardiac Arrhythmia, Pacemakers, and the ICD.   Cardiac Procedures: - Group verbal and written instruction and models to describe the testing methods done to diagnose heart disease. Reviews the outcomes of the test results. Describes the treatment choices: Medical Management, Angioplasty, or Coronary Bypass Surgery.   Cardiac Medications: - Group verbal and written instruction to review commonly prescribed medications for heart disease. Reviews the medication, class of the drug, and side effects. Includes the steps to properly store meds and maintain the prescription regimen.   Go  Sex-Intimacy & Heart Disease, Get SMART - Goal Setting: - Group verbal and written instruction through game format to discuss heart disease and the return to sexual intimacy. Provides group verbal and written material to discuss and apply goal setting through the application of the S.M.A.R.T. Method.   Other Matters of the Heart: - Provides group verbal, written materials and models to describe Heart Failure, Angina, Valve Disease, and Diabetes in the realm of heart disease. Includes description of the disease process and treatment options available to the cardiac patient.   Exercise & Equipment Safety: - Individual verbal instruction and demonstration of equipment use and safety with use of the equipment. Flowsheet Row Cardiac Rehab from 01/24/2016 in [facility] Cardiac and Pulmonary Rehab  Date  01/13/16  Educator  [facility]  Instruction Review Code  2- meets goals/outcomes      Infection Prevention: - Provides verbal and written material to individual with discussion of infection control including proper hand washing and proper equipment cleaning during exercise session. Flowsheet Row Cardiac Rehab from 01/24/2016 in [facility] Cardiac and Pulmonary Rehab  Date  01/13/16  Educator  [facility]  Instruction Review Code  2- meets goals/outcomes      Falls Prevention: - Provides verbal and written material to individual with discussion of falls prevention and safety. Flowsheet Row Cardiac Rehab from 01/24/2016 in [facility] Cardiac and Pulmonary Rehab  Date  01/13/16  Educator  SB  Instruction Review Code  2- meets goals/outcomes      Diabetes: - Individual verbal and written instruction to review signs/symptoms of diabetes, desired ranges of glucose level fasting, after meals and with exercise. Advice that pre and post exercise glucose checks will be done for 3 sessions at entry of program. Flowsheet Row Cardiac Rehab from 01/24/2016 in [facility] Cardiac and Pulmonary Rehab  Date  01/13/16  Educator  SB  Instruction  Review Code  2- meets goals/outcomes       Knowledge Questionnaire Score:     Knowledge Questionnaire Score - 01/13/16 1811      Knowledge Questionnaire Score   Pre Score 25/28      Core Components/Risk Factors/Patient Goals at Admission:     Personal Goals and Risk Factors at Admission - 01/13/16 1804      Core Components/Risk Factors/Patient Goals on Admission    Weight Management Obesity;Weight Maintenance;Yes   Goal Weight: Long Term 260 lb (117.9 kg)   Expected Outcomes Short Term: Continue to assess and modify interventions until short term weight is  achieved;Long Term: Adherence to nutrition and physical activity/exercise program aimed toward attainment of established weight goal;Weight Loss: Understanding of general recommendations for a balanced deficit meal plan, which promotes 1-2 lb weight loss per week and includes a negative energy balance of [phone] kcal/d   Sedentary Yes   Intervention Provide advice, education, support and counseling about physical activity/exercise needs.;Develop an individualized exercise prescription for aerobic and resistive training based on initial evaluation findings, risk stratification, comorbidities and participant's personal goals.   Expected Outcomes Achievement of increased cardiorespiratory fitness and enhanced flexibility, muscular endurance and strength shown through measurements of functional capacity and personal statement of participant.   Diabetes Yes   Intervention Provide education about signs/symptoms and action to take for hypo/hyperglycemia.;Provide education about proper nutrition, including hydration, and aerobic/resistive exercise prescription along with prescribed medications to achieve blood glucose in normal ranges: Fasting glucose 65-99 mg/dL   Expected Outcomes Short Term: Participant verbalizes understanding of the signs/symptoms and immediate care of hyper/hypoglycemia, proper foot care and importance of medication,  aerobic/resistive exercise and nutrition plan for blood glucose control.;Long Term: Attainment of HbA1C < 7%.  LAst A1c 7.7%,ususally 6% or under   Hypertension Yes   Intervention Provide education on lifestyle modifcations including regular physical activity/exercise, weight management, moderate sodium restriction and increased consumption of fresh fruit, vegetables, and low fat dairy, alcohol moderation, and smoking cessation.;Monitor prescription use compliance.   Expected Outcomes Short Term: Continued assessment and intervention until BP is < 140/11m HG in hypertensive participants. < 130/829mHG in hypertensive participants with diabetes, heart failure or chronic kidney disease.;Long Term: Maintenance of blood pressure at goal levels.   Lipids Yes   Intervention Provide education and support for participant on nutrition & aerobic/resistive exercise along with prescribed medications to achieve LDL <7075mHDL >74m48m Expected Outcomes Short Term: Participant states understanding of desired cholesterol values and is compliant with medications prescribed. Participant is following exercise prescription and nutrition guidelines.;Long Term: Cholesterol controlled with medications as prescribed, with individualized exercise RX and with personalized nutrition plan. Value goals: LDL < 70mg55mL > 40 mg.   Stress --  Financial stress   Intervention Offer individual and/or small group education and counseling on adjustment to heart disease, stress management and health-related lifestyle change. Teach and support self-help strategies.;Refer participants experiencing significant psychosocial distress to appropriate mental health specialists for further evaluation and treatment. When possible, include family members and significant others in education/counseling sessions.   Expected Outcomes Short Term: Participant demonstrates changes in health-related behavior, relaxation and other stress management skills,  ability to obtain effective social support, and compliance with psychotropic medications if prescribed.;Long Term: Emotional wellbeing is indicated by absence of clinically significant psychosocial distress or social isolation.      Core Components/Risk Factors/Patient Goals Review:    Core Components/Risk Factors/Patient Goals at Discharge (Final Review):    ITP Comments:     ITP Comments    Row Name 01/13/16 1754 01/26/16 1131         ITP Comments Initial ITP created during medical review and evaluation. Diagnosis documentation found in CARE EVERYWHERE 12/20/2015 Hospital Summary 30 day review. Continue with ITP unless changes noted by Medical Director at signature of review. New to program         Comments:

## 2016-01-31 ENCOUNTER — Encounter: Payer: Medicare Other | Admitting: *Deleted

## 2016-01-31 DIAGNOSIS — I214 Non-ST elevation (NSTEMI) myocardial infarction: Secondary | ICD-10-CM

## 2016-01-31 DIAGNOSIS — Z955 Presence of coronary angioplasty implant and graft: Secondary | ICD-10-CM | POA: Diagnosis not present

## 2016-01-31 LAB — GLUCOSE, CAPILLARY
GLUCOSE-CAPILLARY: 112 mg/dL — AB (ref 65–99)
Glucose-Capillary: 73 mg/dL (ref 65–99)
Glucose-Capillary: 109 mg/dL — ABNORMAL HIGH (ref 65–99)

## 2016-01-31 NOTE — Progress Notes (Signed)
Daily Session Note  Patient Details  Name: Dennis Mullen MRN: [id number] Date of Birth: 04/22/1950 Referring Provider:   Flowsheet Row Cardiac Rehab from 01/13/2016 in [facility] Cardiac [facility]  Referring Provider  [name]      Encounter Date: 01/31/2016  Check In:     Session Check In - 01/31/16 0914      Check-In   Location [facility]-Cardiac & Pulmonary Rehab   Staff Present Alberteen Sam, MA, ACSM RCEP, Exercise Physiologist;Kelly Amedeo Plenty, BS, ACSM CEP, Exercise Physiologist;Carroll Enterkin, RN, BSN   Supervising physician immediately available to respond to emergencies See telemetry face sheet for immediately available ER MD   Medication changes reported     No   Fall or balance concerns reported    No   Warm-up and Cool-down Performed on first and last piece of equipment   Resistance Training Performed Yes   VAD Patient? No     Pain Assessment   Currently in Pain? No/denies   Multiple Pain Sites No         Goals Met:  Independence with exercise equipment Exercise tolerated well No report of cardiac concerns or symptoms Strength training completed today  Goals Unmet:  Not Applicable  Comments: Pt able to follow exercise prescription today without complaint.  Will continue to monitor for progression.    Dr. Emily Filbert is Medical Director for [place] and [facility].

## 2016-02-01 ENCOUNTER — Inpatient Hospital Stay
Admission: EM | Admit: 2016-02-01 | Discharge: 2016-02-03 | DRG: 918 | Disposition: A | Discharge status: Home / Self Care | Payer: Medicare Other | Attending: Internal Medicine | Admitting: Internal Medicine

## 2016-02-01 DIAGNOSIS — F3181 Bipolar II disorder: Secondary | ICD-10-CM | POA: Diagnosis present

## 2016-02-01 DIAGNOSIS — F329 Major depressive disorder, single episode, unspecified: Secondary | ICD-10-CM | POA: Diagnosis not present

## 2016-02-01 DIAGNOSIS — E162 Hypoglycemia, unspecified: Secondary | ICD-10-CM | POA: Diagnosis not present

## 2016-02-01 DIAGNOSIS — G4700 Insomnia, unspecified: Secondary | ICD-10-CM | POA: Diagnosis not present

## 2016-02-01 DIAGNOSIS — T383X2A Poisoning by insulin and oral hypoglycemic [antidiabetic] drugs, intentional self-harm, initial encounter: Principal | ICD-10-CM | POA: Diagnosis present

## 2016-02-01 DIAGNOSIS — N179 Acute kidney failure, unspecified: Secondary | ICD-10-CM | POA: Diagnosis not present

## 2016-02-01 DIAGNOSIS — E039 Hypothyroidism, unspecified: Secondary | ICD-10-CM | POA: Diagnosis present

## 2016-02-01 DIAGNOSIS — Z955 Presence of coronary angioplasty implant and graft: Secondary | ICD-10-CM | POA: Diagnosis not present

## 2016-02-01 DIAGNOSIS — I110 Hypertensive heart disease with heart failure: Secondary | ICD-10-CM | POA: Diagnosis present

## 2016-02-01 DIAGNOSIS — Z79899 Other long term (current) drug therapy: Secondary | ICD-10-CM | POA: Diagnosis not present

## 2016-02-01 DIAGNOSIS — F603 Borderline personality disorder: Secondary | ICD-10-CM | POA: Diagnosis present

## 2016-02-01 DIAGNOSIS — Z7902 Long term (current) use of antithrombotics/antiplatelets: Secondary | ICD-10-CM

## 2016-02-01 DIAGNOSIS — Z9989 Dependence on other enabling machines and devices: Secondary | ICD-10-CM

## 2016-02-01 DIAGNOSIS — E11649 Type 2 diabetes mellitus with hypoglycemia without coma: Secondary | ICD-10-CM | POA: Diagnosis not present

## 2016-02-01 DIAGNOSIS — Z7982 Long term (current) use of aspirin: Secondary | ICD-10-CM

## 2016-02-01 DIAGNOSIS — Z818 Family history of other mental and behavioral disorders: Secondary | ICD-10-CM

## 2016-02-01 DIAGNOSIS — Z794 Long term (current) use of insulin: Secondary | ICD-10-CM | POA: Diagnosis not present

## 2016-02-01 DIAGNOSIS — I509 Heart failure, unspecified: Secondary | ICD-10-CM | POA: Diagnosis not present

## 2016-02-01 DIAGNOSIS — L02611 Cutaneous abscess of right foot: Secondary | ICD-10-CM | POA: Diagnosis present

## 2016-02-01 DIAGNOSIS — L97511 Non-pressure chronic ulcer of other part of right foot limited to breakdown of skin: Secondary | ICD-10-CM | POA: Diagnosis not present

## 2016-02-01 DIAGNOSIS — T383X1A Poisoning by insulin and oral hypoglycemic [antidiabetic] drugs, accidental (unintentional), initial encounter: Secondary | ICD-10-CM | POA: Diagnosis present

## 2016-02-01 DIAGNOSIS — E1151 Type 2 diabetes mellitus with diabetic peripheral angiopathy without gangrene: Secondary | ICD-10-CM | POA: Diagnosis present

## 2016-02-01 DIAGNOSIS — F32A Depression, unspecified: Secondary | ICD-10-CM

## 2016-02-01 DIAGNOSIS — G4733 Obstructive sleep apnea (adult) (pediatric): Secondary | ICD-10-CM | POA: Diagnosis not present

## 2016-02-01 DIAGNOSIS — E1142 Type 2 diabetes mellitus with diabetic polyneuropathy: Secondary | ICD-10-CM | POA: Diagnosis not present

## 2016-02-01 DIAGNOSIS — G629 Polyneuropathy, unspecified: Secondary | ICD-10-CM | POA: Diagnosis present

## 2016-02-01 DIAGNOSIS — K219 Gastro-esophageal reflux disease without esophagitis: Secondary | ICD-10-CM | POA: Diagnosis not present

## 2016-02-01 DIAGNOSIS — I2510 Atherosclerotic heart disease of native coronary artery without angina pectoris: Secondary | ICD-10-CM | POA: Diagnosis not present

## 2016-02-01 DIAGNOSIS — G8929 Other chronic pain: Secondary | ICD-10-CM | POA: Diagnosis not present

## 2016-02-01 DIAGNOSIS — L03115 Cellulitis of right lower limb: Secondary | ICD-10-CM | POA: Diagnosis not present

## 2016-02-01 DIAGNOSIS — F419 Anxiety disorder, unspecified: Secondary | ICD-10-CM | POA: Diagnosis present

## 2016-02-01 DIAGNOSIS — M109 Gout, unspecified: Secondary | ICD-10-CM | POA: Diagnosis present

## 2016-02-01 DIAGNOSIS — N400 Enlarged prostate without lower urinary tract symptoms: Secondary | ICD-10-CM | POA: Diagnosis present

## 2016-02-01 LAB — GLUCOSE, CAPILLARY: Glucose-Capillary: 69 mg/dL (ref 65–99)

## 2016-02-01 NOTE — ED Triage Notes (Signed)
Pt arrived to ED by EMS after taking 300-500 units of insulin in attempt to hurt himself. EMS reports pt was depressed bc he wasn't able to get his diabetic shoes. On scene, RMS got 60CBG, dropped to 40 while establishing IV, 1 Amp D50 administered at that time, in route 111CBG. Pt talking at time of triage, denies SI/HI thoughts.

## 2016-02-02 ENCOUNTER — Encounter: Payer: Self-pay | Admitting: Emergency Medicine

## 2016-02-02 DIAGNOSIS — F3181 Bipolar II disorder: Secondary | ICD-10-CM

## 2016-02-02 DIAGNOSIS — F329 Major depressive disorder, single episode, unspecified: Secondary | ICD-10-CM | POA: Diagnosis not present

## 2016-02-02 DIAGNOSIS — G8929 Other chronic pain: Secondary | ICD-10-CM | POA: Diagnosis present

## 2016-02-02 DIAGNOSIS — T383X2A Poisoning by insulin and oral hypoglycemic [antidiabetic] drugs, intentional self-harm, initial encounter: Principal | ICD-10-CM

## 2016-02-02 DIAGNOSIS — E162 Hypoglycemia, unspecified: Secondary | ICD-10-CM

## 2016-02-02 DIAGNOSIS — K219 Gastro-esophageal reflux disease without esophagitis: Secondary | ICD-10-CM | POA: Diagnosis present

## 2016-02-02 DIAGNOSIS — N179 Acute kidney failure, unspecified: Secondary | ICD-10-CM | POA: Diagnosis present

## 2016-02-02 DIAGNOSIS — Z794 Long term (current) use of insulin: Secondary | ICD-10-CM | POA: Diagnosis not present

## 2016-02-02 DIAGNOSIS — Z7982 Long term (current) use of aspirin: Secondary | ICD-10-CM | POA: Diagnosis not present

## 2016-02-02 DIAGNOSIS — Z9989 Dependence on other enabling machines and devices: Secondary | ICD-10-CM

## 2016-02-02 DIAGNOSIS — E11649 Type 2 diabetes mellitus with hypoglycemia without coma: Secondary | ICD-10-CM | POA: Diagnosis present

## 2016-02-02 DIAGNOSIS — F603 Borderline personality disorder: Secondary | ICD-10-CM | POA: Diagnosis present

## 2016-02-02 DIAGNOSIS — G4733 Obstructive sleep apnea (adult) (pediatric): Secondary | ICD-10-CM

## 2016-02-02 DIAGNOSIS — E1151 Type 2 diabetes mellitus with diabetic peripheral angiopathy without gangrene: Secondary | ICD-10-CM | POA: Diagnosis present

## 2016-02-02 DIAGNOSIS — Z7902 Long term (current) use of antithrombotics/antiplatelets: Secondary | ICD-10-CM | POA: Diagnosis not present

## 2016-02-02 DIAGNOSIS — G629 Polyneuropathy, unspecified: Secondary | ICD-10-CM | POA: Diagnosis present

## 2016-02-02 DIAGNOSIS — Z955 Presence of coronary angioplasty implant and graft: Secondary | ICD-10-CM | POA: Diagnosis not present

## 2016-02-02 DIAGNOSIS — T383X1A Poisoning by insulin and oral hypoglycemic [antidiabetic] drugs, accidental (unintentional), initial encounter: Secondary | ICD-10-CM | POA: Diagnosis present

## 2016-02-02 DIAGNOSIS — F32A Depression, unspecified: Secondary | ICD-10-CM

## 2016-02-02 DIAGNOSIS — Z79899 Other long term (current) drug therapy: Secondary | ICD-10-CM | POA: Diagnosis not present

## 2016-02-02 DIAGNOSIS — L03115 Cellulitis of right lower limb: Secondary | ICD-10-CM | POA: Diagnosis present

## 2016-02-02 DIAGNOSIS — I509 Heart failure, unspecified: Secondary | ICD-10-CM | POA: Diagnosis present

## 2016-02-02 DIAGNOSIS — G4700 Insomnia, unspecified: Secondary | ICD-10-CM | POA: Diagnosis present

## 2016-02-02 DIAGNOSIS — I2510 Atherosclerotic heart disease of native coronary artery without angina pectoris: Secondary | ICD-10-CM | POA: Diagnosis present

## 2016-02-02 DIAGNOSIS — L02611 Cutaneous abscess of right foot: Secondary | ICD-10-CM | POA: Diagnosis present

## 2016-02-02 DIAGNOSIS — E039 Hypothyroidism, unspecified: Secondary | ICD-10-CM | POA: Diagnosis present

## 2016-02-02 DIAGNOSIS — F419 Anxiety disorder, unspecified: Secondary | ICD-10-CM | POA: Diagnosis present

## 2016-02-02 DIAGNOSIS — I110 Hypertensive heart disease with heart failure: Secondary | ICD-10-CM | POA: Diagnosis present

## 2016-02-02 LAB — BASIC METABOLIC PANEL
ANION GAP: 7 (ref 5–15)
ANION GAP: 6 (ref 5–15)
BUN: 32 mg/dL — ABNORMAL HIGH (ref 6–20)
BUN: 27 mg/dL — ABNORMAL HIGH (ref 6–20)
CHLORIDE: 106 mmol/L (ref 101–111)
CO2: 25 mmol/L (ref 22–32)
CO2: 28 mmol/L (ref 22–32)
CREATININE: 1.42 mg/dL — AB (ref 0.61–1.24)
Calcium: 9 mg/dL (ref 8.9–10.3)
Calcium: 8.8 mg/dL — ABNORMAL LOW (ref 8.9–10.3)
Chloride: 102 mmol/L (ref 101–111)
Creatinine, Ser: 1.24 mg/dL (ref 0.61–1.24)
GFR calc Af Amer: >60 mL/min (ref >60)
GFR calc non Af Amer: 51 mL/min — ABNORMAL LOW (ref >60)
GFR calc non Af Amer: >60 mL/min (ref >60)
GFR, EST AFRICAN AMERICAN: 59 mL/min — AB (ref >60)
GLUCOSE: 203 mg/dL — AB (ref 65–99)
Glucose, Bld: 63 mg/dL — ABNORMAL LOW (ref 65–99)
POTASSIUM: 3.8 mmol/L (ref 3.5–5.1)
POTASSIUM: 4 mmol/L (ref 3.5–5.1)
SODIUM: 138 mmol/L (ref 135–145)
Sodium: 136 mmol/L (ref 135–145)

## 2016-02-02 LAB — GLUCOSE, CAPILLARY
GLUCOSE-CAPILLARY: 97 mg/dL (ref 65–99)
GLUCOSE-CAPILLARY: 168 mg/dL — AB (ref 65–99)
GLUCOSE-CAPILLARY: 93 mg/dL (ref 65–99)
GLUCOSE-CAPILLARY: 99 mg/dL (ref 65–99)
GLUCOSE-CAPILLARY: 126 mg/dL — AB (ref 65–99)
GLUCOSE-CAPILLARY: 118 mg/dL — AB (ref 65–99)
GLUCOSE-CAPILLARY: 191 mg/dL — AB (ref 65–99)
GLUCOSE-CAPILLARY: 217 mg/dL — AB (ref 65–99)
GLUCOSE-CAPILLARY: 214 mg/dL — AB (ref 65–99)
Glucose-Capillary: 101 mg/dL — ABNORMAL HIGH (ref 65–99)
Glucose-Capillary: 107 mg/dL — ABNORMAL HIGH (ref 65–99)
Glucose-Capillary: 141 mg/dL — ABNORMAL HIGH (ref 65–99)
Glucose-Capillary: 144 mg/dL — ABNORMAL HIGH (ref 65–99)
Glucose-Capillary: 182 mg/dL — ABNORMAL HIGH (ref 65–99)
Glucose-Capillary: 227 mg/dL — ABNORMAL HIGH (ref 65–99)
Glucose-Capillary: 228 mg/dL — ABNORMAL HIGH (ref 65–99)
Glucose-Capillary: 44 mg/dL — CL (ref 65–99)
Glucose-Capillary: 59 mg/dL — ABNORMAL LOW (ref 65–99)
Glucose-Capillary: 64 mg/dL — ABNORMAL LOW (ref 65–99)

## 2016-02-02 LAB — CBC WITH DIFFERENTIAL/PLATELET
Basophils Absolute: 0 10*3/uL (ref 0–0.1)
Basophils Relative: 0 %
EOS PCT: 1 %
Eosinophils Absolute: 0.1 10*3/uL (ref 0–0.7)
HCT: 35.5 % — ABNORMAL LOW (ref 40.0–52.0)
Hemoglobin: 11.5 g/dL — ABNORMAL LOW (ref 13.0–18.0)
LYMPHS ABS: 1.7 10*3/uL (ref 1.0–3.6)
LYMPHS PCT: 20 %
MCH: 27.9 pg (ref 26.0–34.0)
MCHC: 32.3 g/dL (ref 32.0–36.0)
MCV: 86.5 fL (ref 80.0–100.0)
MONO ABS: 0.7 10*3/uL (ref 0.2–1.0)
Monocytes Relative: 8 %
Neutro Abs: 5.8 10*3/uL (ref 1.4–6.5)
Neutrophils Relative %: 71 %
PLATELETS: 213 10*3/uL (ref 150–440)
RBC: 4.11 MIL/uL — ABNORMAL LOW (ref 4.40–5.90)
RDW: 16.6 % — ABNORMAL HIGH (ref 11.5–14.5)
WBC: 8.3 10*3/uL (ref 3.8–10.6)

## 2016-02-02 LAB — URINE DRUG SCREEN, QUALITATIVE (ARMC ONLY)
Amphetamines, Ur Screen: NOT DETECTED
BARBITURATES, UR SCREEN: NOT DETECTED
BENZODIAZEPINE, UR SCRN: NOT DETECTED
CANNABINOID 50 NG, UR ~~LOC~~: NOT DETECTED
Cocaine Metabolite,Ur ~~LOC~~: NOT DETECTED
MDMA (Ecstasy)Ur Screen: NOT DETECTED
METHADONE SCREEN, URINE: NOT DETECTED
OPIATE, UR SCREEN: NOT DETECTED
Phencyclidine (PCP) Ur S: NOT DETECTED
TRICYCLIC, UR SCREEN: NOT DETECTED

## 2016-02-02 LAB — CBC
HCT: 33.5 % — ABNORMAL LOW (ref 40.0–52.0)
HEMOGLOBIN: 10.8 g/dL — AB (ref 13.0–18.0)
MCH: 28 pg (ref 26.0–34.0)
MCHC: 32.3 g/dL (ref 32.0–36.0)
MCV: 86.5 fL (ref 80.0–100.0)
Platelets: 197 10*3/uL (ref 150–440)
RBC: 3.87 MIL/uL — AB (ref 4.40–5.90)
RDW: 16.6 % — ABNORMAL HIGH (ref 11.5–14.5)
WBC: 7.4 10*3/uL (ref 3.8–10.6)

## 2016-02-02 LAB — COMPREHENSIVE METABOLIC PANEL
ALT: 21 U/L (ref 17–63)
AST: 34 U/L (ref 15–41)
Albumin: 3.8 g/dL (ref 3.5–5.0)
Alkaline Phosphatase: 54 U/L (ref 38–126)
Anion gap: 8 (ref 5–15)
BUN: 31 mg/dL — ABNORMAL HIGH (ref 6–20)
CHLORIDE: 105 mmol/L (ref 101–111)
CO2: 28 mmol/L (ref 22–32)
CREATININE: 1.57 mg/dL — AB (ref 0.61–1.24)
Calcium: 9.3 mg/dL (ref 8.9–10.3)
GFR, EST AFRICAN AMERICAN: 52 mL/min — AB (ref >60)
GFR, EST NON AFRICAN AMERICAN: 45 mL/min — AB (ref >60)
Glucose, Bld: 51 mg/dL — ABNORMAL LOW (ref 65–99)
POTASSIUM: 3.6 mmol/L (ref 3.5–5.1)
Sodium: 141 mmol/L (ref 135–145)
Total Bilirubin: 0.6 mg/dL (ref 0.3–1.2)
Total Protein: 7.7 g/dL (ref 6.5–8.1)

## 2016-02-02 LAB — PHOSPHORUS
PHOSPHORUS: 2.3 mg/dL — AB (ref 2.5–4.6)
Phosphorus: 3.7 mg/dL (ref 2.5–4.6)

## 2016-02-02 LAB — SALICYLATE LEVEL

## 2016-02-02 LAB — MAGNESIUM: MAGNESIUM: 1.9 mg/dL (ref 1.7–2.4)

## 2016-02-02 LAB — VALPROIC ACID LEVEL: VALPROIC ACID LVL: 25 ug/mL — AB (ref 50.0–100.0)

## 2016-02-02 LAB — ACETAMINOPHEN LEVEL

## 2016-02-02 LAB — MRSA PCR SCREENING: MRSA by PCR: NEGATIVE

## 2016-02-02 LAB — ETHANOL: Alcohol, Ethyl (B): <5 mg/dL (ref <5)

## 2016-02-02 MED ORDER — METOPROLOL SUCCINATE ER 50 MG PO TB24
50.0000 mg | ORAL_TABLET | Freq: Every day | ORAL | Status: DC
  Administered 2016-02-02: 50 mg via ORAL
  Filled 2016-02-02 (×2): qty 1

## 2016-02-02 MED ORDER — SODIUM CHLORIDE 0.9 % IV SOLN: 250.0000 mL | INTRAVENOUS | Status: DC | PRN

## 2016-02-02 MED ORDER — VITAMIN C 500 MG PO TABS
500.0000 mg | ORAL_TABLET | Freq: Every day | ORAL | Status: DC
  Administered 2016-02-02 – 2016-02-03 (×2): 500 mg via ORAL
  Filled 2016-02-02 (×2): qty 1

## 2016-02-02 MED ORDER — AMLODIPINE BESYLATE 5 MG PO TABS
5.0000 mg | ORAL_TABLET | Freq: Every day | ORAL | Status: DC
  Administered 2016-02-02: 5 mg via ORAL
  Filled 2016-02-02 (×2): qty 1

## 2016-02-02 MED ORDER — ROSUVASTATIN CALCIUM 10 MG PO TABS
5.0000 mg | ORAL_TABLET | Freq: Every day | ORAL | Status: DC
  Filled 2016-02-02: qty 1

## 2016-02-02 MED ORDER — ISOSORBIDE MONONITRATE ER 30 MG PO TB24
30.0000 mg | ORAL_TABLET | Freq: Every day | ORAL | Status: DC
  Administered 2016-02-02 – 2016-02-03 (×2): 30 mg via ORAL
  Filled 2016-02-02 (×2): qty 1

## 2016-02-02 MED ORDER — FAMOTIDINE 20 MG PO TABS
40.0000 mg | ORAL_TABLET | Freq: Every day | ORAL | Status: DC
  Administered 2016-02-02 – 2016-02-03 (×2): 40 mg via ORAL
  Filled 2016-02-02 (×2): qty 2

## 2016-02-02 MED ORDER — DEXTROSE 10 % IV SOLN
INTRAVENOUS | Status: DC
  Administered 2016-02-02: via INTRAVENOUS
  Administered 2016-02-02: 200 mL/h via INTRAVENOUS

## 2016-02-02 MED ORDER — LEVOTHYROXINE SODIUM 50 MCG PO TABS
50.0000 ug | ORAL_TABLET | Freq: Every day | ORAL | Status: DC
  Administered 2016-02-02 – 2016-02-03 (×2): 50 ug via ORAL
  Filled 2016-02-02 (×3): qty 1

## 2016-02-02 MED ORDER — K PHOS MONO-SOD PHOS DI & MONO 155-852-130 MG PO TABS
500.0000 mg | ORAL_TABLET | Freq: Once | ORAL | Status: AC
  Administered 2016-02-02: 500 mg via ORAL
  Filled 2016-02-02: qty 2

## 2016-02-02 MED ORDER — DIVALPROEX SODIUM ER 500 MG PO TB24
500.0000 mg | ORAL_TABLET | Freq: Every day | ORAL | Status: DC
  Administered 2016-02-02 – 2016-02-03 (×2): 500 mg via ORAL
  Filled 2016-02-02 (×2): qty 1

## 2016-02-02 MED ORDER — HEPARIN SODIUM (PORCINE) 5000 UNIT/ML IJ SOLN: 5000.0000 [IU] | Freq: Three times a day (TID) | INTRAMUSCULAR | Status: DC

## 2016-02-02 MED ORDER — FENOFIBRATE 145 MG PO TABS
145.0000 mg | ORAL_TABLET | Freq: Every day | ORAL | Status: DC
  Administered 2016-02-02 – 2016-02-03 (×2): 145 mg via ORAL
  Filled 2016-02-02 (×2): qty 1

## 2016-02-02 MED ORDER — DULOXETINE HCL 60 MG PO CPEP
60.0000 mg | ORAL_CAPSULE | Freq: Every day | ORAL | Status: DC
  Administered 2016-02-02 – 2016-02-03 (×2): 60 mg via ORAL
  Filled 2016-02-02 (×2): qty 1

## 2016-02-02 MED ORDER — ASPIRIN EC 81 MG PO TBEC
81.0000 mg | DELAYED_RELEASE_TABLET | Freq: Every day | ORAL | Status: DC
  Administered 2016-02-02 – 2016-02-03 (×2): 81 mg via ORAL
  Filled 2016-02-02 (×2): qty 1

## 2016-02-02 MED ORDER — DEXTROSE 10 % IV SOLN: INTRAVENOUS | Status: DC

## 2016-02-02 MED ORDER — ISOSORBIDE DINITRATE 30 MG PO TABS: 30.0000 mg | ORAL_TABLET | Freq: Every day | ORAL | Status: DC

## 2016-02-02 MED ORDER — DEXTROSE 50 % IV SOLN
1.0000 | Freq: Once | INTRAVENOUS | Status: AC
Start: 2016-02-02 — End: 2016-02-02
  Administered 2016-02-02: 25 mL via INTRAVENOUS
  Filled 2016-02-02: qty 50

## 2016-02-02 MED ORDER — PREGABALIN 50 MG PO CAPS
100.0000 mg | ORAL_CAPSULE | Freq: Three times a day (TID) | ORAL | Status: DC
  Administered 2016-02-02 – 2016-02-03 (×5): 100 mg via ORAL
  Filled 2016-02-02 (×5): qty 2

## 2016-02-02 MED ORDER — DEXTROSE 50 % IV SOLN
1.0000 | Freq: Once | INTRAVENOUS | Status: AC
  Administered 2016-02-02: 50 mL via INTRAVENOUS

## 2016-02-02 MED ORDER — GLUCAGON HCL RDNA (DIAGNOSTIC) 1 MG IJ SOLR
1.0000 mg | Freq: Once | INTRAMUSCULAR | Status: DC | PRN
  Filled 2016-02-02: qty 1

## 2016-02-02 MED ORDER — DEXTROSE 50 % IV SOLN
25.0000 mL | INTRAVENOUS | Status: DC | PRN
  Administered 2016-02-02 (×2): 25 mL via INTRAVENOUS
  Filled 2016-02-02 (×2): qty 50

## 2016-02-02 MED ORDER — CLOPIDOGREL BISULFATE 75 MG PO TABS
75.0000 mg | ORAL_TABLET | Freq: Every day | ORAL | Status: DC
  Administered 2016-02-02 – 2016-02-03 (×2): 75 mg via ORAL
  Filled 2016-02-02 (×2): qty 1

## 2016-02-02 MED ORDER — DIVALPROEX SODIUM ER 500 MG PO TB24
1000.0000 mg | ORAL_TABLET | Freq: Every day | ORAL | Status: DC
  Administered 2016-02-02: 1000 mg via ORAL
  Filled 2016-02-02 (×2): qty 2

## 2016-02-02 MED ORDER — ALLOPURINOL 100 MG PO TABS
100.0000 mg | ORAL_TABLET | Freq: Every day | ORAL | Status: DC
  Administered 2016-02-02 – 2016-02-03 (×2): 100 mg via ORAL
  Filled 2016-02-02 (×2): qty 1

## 2016-02-02 MED ORDER — ENOXAPARIN SODIUM 40 MG/0.4ML ~~LOC~~ SOLN
40.0000 mg | SUBCUTANEOUS | Status: DC
  Administered 2016-02-02: 40 mg via SUBCUTANEOUS
  Filled 2016-02-02: qty 0.4

## 2016-02-02 MED ORDER — ROPINIROLE HCL 1 MG PO TABS
1.0000 mg | ORAL_TABLET | Freq: Two times a day (BID) | ORAL | Status: DC
  Administered 2016-02-02 – 2016-02-03 (×3): 1 mg via ORAL
  Filled 2016-02-02 (×3): qty 1

## 2016-02-02 NOTE — ED Provider Notes (Signed)
[facility] Emergency Department Provider Note   ____________________________________________   First MD Initiated Contact with Patient 02/02/16 0000     (approximate)  I have reviewed the triage vital signs and the nursing notes.   HISTORY  Chief Complaint No chief complaint on file.    HPI Dennis Mullen is a 65 y.o. male who comes into the hospital today after overdosing on his insulin. The patient reports that he crashed and hit bottom. He took 6 - 1/2 mL shots of Humalog tonight resulting in a300 unit dose of insulin. The patient reports that he was not thinking clearly. He reports that he just didn't want to be here anymore. He does have some cardiac disease and reports he had a stent recently in September. The patient reports that it took some time to work. At first his foot sugars were 88 but when EMS arrived it was lower. The patient reports that he has tried to kill himself like this in the past. He took insulin as well as Toprol. The patient did make a statement that he was out of Toprol so he needed to get a refill. The patient takes depression medication and has not missed any doses. He reports he takes Depakote and Cymbalta. According to EMS the patient's blood sugars were 65 then 60 then 45. He received an amp of D50 and his blood sugar was 159 then 111 and 69 when he arrived. The patient is here for evaluation. He denies any complaints at this time. The patient denies current or active suicidal ideation.   Past Medical History:  Diagnosis Date  . Abnormal levels of other serum enzymes   . Anxiety   . Bipolar disorder ([place])   . BPH (benign prostatic hyperplasia)   . CAD (coronary artery disease)   . CHF (congestive heart failure) ([place])    ?  Marland Kitchen Depression   . Diabetes mellitus, type II ([place])   . Diabetes mellitus, type II, insulin dependent ([place])   . ED (erectile dysfunction)   . GERD (gastroesophageal reflux disease)   . Gout   .  History of borderline personality disorder   . Hypertension   . Hypogonadism in male   . Insomnia   . Mood disorder ([place])   . Neuropathy ([place])   . Obesity   . OSA (obstructive sleep apnea)   . PVD (peripheral vascular disease) ([place])   . Thyroid disease     Patient Active Problem List   Diagnosis Date Noted  . SOB (shortness of breath) 10/11/2015  . Elevated troponin 10/11/2015  . Chest pain at rest 09/24/2015  . Elevated PSA 07/26/2015  . Hypogonadism in male 07/26/2015  . 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 BPH with obstruction/lower urinary tract symptoms 06/29/2015  . Bipolar 1 disorder, mixed ([place]) 04/14/2015  . Borderline personality disorder 04/14/2015  . Acute renal failure ([place]) 11/15/2014  . Chest pain 11/14/2014  . Dizziness 11/05/2014  . Benign essential HTN 11/04/2014  . Combined fat and carbohydrate induced hyperlipemia 11/04/2014  . Bipolar 2 disorder ([place]) 10/27/2014  . Acquired hypothyroidism 02/03/2014  . Type 2 diabetes mellitus ([place]) 02/03/2014  . Adiposity 01/06/2014  . Obesity, diabetes, and hypertension syndrome ([place]) 12/23/2013  . Long term current use of insulin ([place]) 12/23/2013  . Type 2 diabetes mellitus with other diabetic neurological complication 77/41/4239  . Disorder affecting the body's metabolism 12/23/2013  . Acute inflammation of the pancreas 11/18/2013  . Elevated cholesterol with elevated  triglycerides 11/18/2013  . CAD (coronary artery disease), native coronary artery 09/26/2013  . Diabetes mellitus ([place]) 09/26/2013  . Acute non-ST elevation myocardial infarction (NSTEMI) ([place]) 09/26/2013  . Non-ST elevation (NSTEMI) myocardial infarction [facility]) 09/26/2013    Past Surgical History:  Procedure Laterality Date  . CARDIAC CATHETERIZATION N/A 10/12/2015   Procedure: Left Heart Cath and Coronary Angiography;  Surgeon: Teodoro Spray, MD;  Location: [facility];  Service: Cardiovascular;  Laterality: N/A;  .  CORONARY ANGIOPLASTY WITH STENT PLACEMENT     x5 stents  . FOOT SURGERY    . MASTOIDECTOMY Right   . NASAL SEPTUM SURGERY    . penial inplant    . PENILE PROSTHESIS IMPLANT    . right mastoidectomy    . SCROTOPLASTY    . TONSILLECTOMY AND ADENOIDECTOMY      Prior to Admission medications   Medication Sig Start Date End Date Taking? Authorizing Provider  allopurinol (ZYLOPRIM) 100 MG tablet Take 100 mg by mouth every morning.    Historical Provider, MD  amLODipine (NORVASC) 5 MG tablet Take 5 mg by mouth daily.  10/14/14   Historical Provider, MD  aspirin EC 81 MG tablet Take 81 mg by mouth every morning. Reported on 07/26/2015    Historical Provider, MD  clopidogrel (PLAVIX) 75 MG tablet Take 75 mg by mouth every morning. Reported on 07/26/2015    Historical Provider, MD  colchicine 0.6 MG tablet  11/08/15   Historical Provider, MD  diclofenac (VOLTAREN) 75 MG EC tablet  12/09/15   Historical Provider, MD  divalproex (DEPAKOTE ER) 500 MG 24 hr tablet Take 1 tablet (500 mg total) by mouth 3 (three) times daily. Take 55m in morning and 10077min evening Patient taking differently: Take 500-1,000 mg by mouth 2 (two) times daily. Take 50058mn morning and 1000m49m evening 11/02/15   JohnGonzella Lex  DULoxetine (CYMBALTA) 60 MG capsule Take 1 capsule (60 mg total) by mouth daily. 11/02/15   JohnGonzella Lex  esomeprazole (NEXIUM) 40 MG capsule Take 40 mg by mouth daily.  10/14/14   Historical Provider, MD  fenofibrate (TRICOR) 145 MG tablet Take by mouth. 12/23/15 12/22/16  Historical Provider, MD  furosemide (LASIX) 40 MG tablet Take 1 tablet (40 mg total) by mouth daily. 09/25/15   RichLoletha Grayer  glucose 4 GM chewable tablet Chew 1 tablet by mouth as needed for low blood sugar.    Historical Provider, MD  insulin aspart (NOVOLOG) 100 UNIT/ML injection Inject 12-20 Units into the skin 3 (three) times daily with meals. Reported on 10/11/2015    Historical Provider, MD  Insulin Disposable Pump  (V-GO 30) KIT Reported on 06/29/2015 10/07/14   Historical Provider, MD  isosorbide mononitrate (IMDUR) 30 MG 24 hr tablet Take 1 tablet (30 mg total) by mouth daily. Patient taking differently: Take 60 mg by mouth daily.  10/12/15   VaibVaughan Basta  levothyroxine (SYNTHROID, LEVOTHROID) 50 MCG tablet Take 50 mcg by mouth daily before breakfast.    Historical Provider, MD  metFORMIN (GLUCOPHAGE) 1000 MG tablet Take 1,000 mg by mouth 2 (two) times daily with a meal.    Historical Provider, MD  metoprolol succinate (TOPROL-XL) 50 MG 24 hr tablet Take 50 mg by mouth daily.  09/11/14   Historical Provider, MD  nitroGLYCERIN (NITROSTAT) 0.4 MG SL tablet Place 0.4 mg under the tongue every 5 (five) minutes x 3 doses as needed for chest pain.  Historical Provider, MD  pioglitazone (ACTOS) 15 MG tablet Take 15 mg by mouth every morning.     Historical Provider, MD  pregabalin (LYRICA) 100 MG capsule Take 100 mg by mouth 3 (three) times daily.    Historical Provider, MD  rOPINIRole (REQUIP) 1 MG tablet Take 1 mg by mouth 2 (two) times daily.  04/27/15   Historical Provider, MD  rosuvastatin (CRESTOR) 5 MG tablet Take 5 mg by mouth daily at 6 PM.  07/05/15   Historical Provider, MD  VICTOZA 18 MG/3ML SOPN Inject 1.8 mg into the skin daily.  09/07/14   Historical Provider, MD  vitamin C (ASCORBIC ACID) 500 MG tablet Take 500 mg by mouth daily.    Historical Provider, MD    Allergies Review of patient's allergies indicates no known allergies.  Family History  Problem Relation Age of Onset  . Lung cancer Mother   . Heart attack Father   . Post-traumatic stress disorder Father   . Anxiety disorder Father   . Depression Father   . Heart attack Sister   . Heart attack Brother     Social History Social History  Substance Use Topics  . Smoking status: Never Smoker  . Smokeless tobacco: Never Used  . Alcohol use No    Review of Systems Constitutional: No fever/chills Eyes: No visual  changes. ENT: No sore throat. Cardiovascular: Denies chest pain. Respiratory: Denies shortness of breath. Gastrointestinal: No abdominal pain.  No nausea, no vomiting.  No diarrhea.  No constipation. Genitourinary: Negative for dysuria. Musculoskeletal: Negative for back pain. Skin: Negative for rash. Neurological: Negative for headaches, focal weakness or numbness. Psych: Intentional drug overdose  10-point ROS otherwise negative.  ____________________________________________   PHYSICAL EXAM:  VITAL SIGNS: ED Triage Vitals  Enc Vitals Group     BP --      Pulse Rate 02/02/16 0006 71     Resp --      Temp 02/02/16 0006 98 F (36.7 C)     Temp Source 02/02/16 0006 Oral     SpO2 02/01/16 2359 98 %     Weight 02/02/16 0007 288 lb (130.6 kg)     Height 02/02/16 0007 6' 2"  (1.88 m)     Head Circumference --      Peak Flow --      Pain Score --      Pain Loc --      Pain Edu? --      Excl. in [place]? --     Constitutional: Alert and oriented. Well appearing and in no acute distress. Eyes: Conjunctivae are normal. PERRL. EOMI. Head: Atraumatic. Nose: No congestion/rhinnorhea. Mouth/Throat: Mucous membranes are moist.  Oropharynx non-erythematous. Cardiovascular: Normal rate, regular rhythm. Systolic murmur.  Good peripheral circulation. Respiratory: Normal respiratory effort.  No retractions. Lungs CTAB. Gastrointestinal: Soft and nontender. No distention. Positive bowel sounds Musculoskeletal: No lower extremity tenderness nor edema.   Neurologic:  Normal speech and language. Skin:  Skin is warm, dry and intact.  Psychiatric: Mood and affect are normal.   ____________________________________________   LABS (all labs ordered are listed, but only abnormal results are displayed)  Labs Reviewed  CBC WITH DIFFERENTIAL/PLATELET - Abnormal; Notable for the following:       Result Value   RBC 4.11 (*)    Hemoglobin 11.5 (*)    HCT 35.5 (*)    RDW 16.6 (*)    All other  components within normal limits  COMPREHENSIVE METABOLIC PANEL - Abnormal; Notable for  the following:    Glucose, Bld 51 (*)    BUN 31 (*)    Creatinine, Ser 1.57 (*)    GFR calc non Af Amer 45 (*)    GFR calc Af Amer 52 (*)    All other components within normal limits  ACETAMINOPHEN LEVEL - Abnormal; Notable for the following:    Acetaminophen (Tylenol), Serum <10 (*)    All other components within normal limits  GLUCOSE, CAPILLARY - Abnormal; Notable for the following:    Glucose-Capillary 59 (*)    All other components within normal limits  GLUCOSE, CAPILLARY  SALICYLATE LEVEL  ETHANOL  GLUCOSE, CAPILLARY  URINE DRUG SCREEN, QUALITATIVE ([facility] ONLY)   ____________________________________________  EKG  ED ECG REPORT I, Loney Hering, the attending physician, personally viewed and interpreted this ECG.   Date: 02/02/2016  EKG Time: 0005  Rate: 70  Rhythm: normal sinus rhythm  Axis: normal  Intervals:prolonged qtc  ST&T Change: none  ____________________________________________  RADIOLOGY  none ____________________________________________   PROCEDURES  Procedure(s) performed: None  Procedures  Critical Care performed: Yes, see critical care note(s)   CRITICAL CARE Performed by: Charlesetta Ivory P   Total critical care time: 30 minutes  Critical care time was exclusive of separately billable procedures and treating other patients.  Critical care was necessary to treat or prevent imminent or life-threatening deterioration.  Critical care was time spent personally by me on the following activities: development of treatment plan with patient and/or surrogate as well as nursing, discussions with consultants, evaluation of patient's response to treatment, examination of patient, obtaining history from patient or surrogate, ordering and performing treatments and interventions, ordering and review of laboratory studies, ordering and review of radiographic  studies, pulse oximetry and re-evaluation of patient's condition.   ____________________________________________   INITIAL IMPRESSION / ASSESSMENT AND PLAN / ED COURSE  Pertinent labs & imaging results that were available during my care of the patient were reviewed by me and considered in my medical decision making (see chart for details).  This is a 65 year old man oh comes into the hospital today after overdosing on his insulin in attempt to kill himself. When the patient arrived here he had a glucose of 69. We gave him an amp of D50 and started him on insulin drip with D10 in normal saline. After 30 minutes we rechecked the patient's blood sugar and it was 59. The patient will be admitted. I will contact the intensive care unit to arrange for admission. I did place the patient under involuntary commitment. He will receive another half an amp of D50.  Clinical Course   We're continuing to check the patient's blood sugar. We'll have the patient eat a meal to help stabilize his blood sugars. Given that he needs frequent rechecks of his glucose as well as a glucose drip he will be admitted to the intensive care unit service.  ____________________________________________   FINAL CLINICAL IMPRESSION(S) / ED DIAGNOSES  Final diagnoses:  Insulin overdose, intentional self-harm, initial encounter ([place])  Hypoglycemia      NEW MEDICATIONS STARTED DURING THIS VISIT:  New Prescriptions   No medications on file     Note:  This document was prepared using Dragon voice recognition software and may include unintentional dictation errors.    Loney Hering, MD 02/02/16 [phone]

## 2016-02-02 NOTE — ED Notes (Signed)
Pt given meal tray and juice, per MD order.

## 2016-02-02 NOTE — H&P (Signed)
PULMONARY / CRITICAL CARE MEDICINE   Name: Dennis Mullen MRN: [id number] DOB: 06-09-1950    ADMISSION DATE:  02/01/2016 CONSULTATION DATE: 02/01/16  REFERRING MD: Dr. Dahlia Client  CHIEF COMPLAINT: Intentional insulin overdose. HISTORY OF PRESENT ILLNESS:    Dennis Mullen is a 65 year old male with extensive medical history which includes severe CAD, CHF, depression, diabetes mellitus type 2, GERD, history of borderline personality disorder, hypertension, gout, obstructive sleep apnea and hypothyroidism. Patient states that "he has been suffering with multiple medical problems and is tired of it. He also states that he is severely depressed for not being able to see his grandkids". Therefore patient injected 300 units of  Humalog insulin to hurt himself. EMS was called and the blood sugar was noted to be 60 and thereafter down to 40 for which he received 1 amp of D50. In the ED patient was started on 10% dextrose and patient was getting his blood sugar monitored frequently. [facility] M team was called to admit the patient to ICU. PAST MEDICAL HISTORY :  He  has a past medical history of Abnormal levels of other serum enzymes; Anxiety; Bipolar disorder (HCC); BPH (benign prostatic hyperplasia); CAD (coronary artery disease); CHF (congestive heart failure) ([place]); Depression; Diabetes mellitus, type II ([place]); Diabetes mellitus, type II, insulin dependent ([place]); ED (erectile dysfunction); GERD (gastroesophageal reflux disease); Gout; History of borderline personality disorder; Hypertension; Hypogonadism in male; Insomnia; Mood disorder ([place]); Neuropathy ([place]); Obesity; OSA (obstructive sleep apnea); PVD (peripheral vascular disease) ([place]); and Thyroid disease.  PAST SURGICAL HISTORY: He  has a past surgical history that includes Tonsillectomy and adenoidectomy; Nasal septum surgery; Mastoidectomy (Right); penial inplant; Coronary angioplasty with stent; right mastoidectomy; Penile prosthesis implant;  Foot surgery; Scrotoplasty; and Cardiac catheterization (N/A, 10/12/2015).  No Known Allergies  No current facility-administered medications on file prior to encounter.    Current Outpatient Prescriptions on File Prior to Encounter  Medication Sig  . allopurinol (ZYLOPRIM) 100 MG tablet Take 100 mg by mouth every morning.  Marland Kitchen amLODipine (NORVASC) 5 MG tablet Take 5 mg by mouth daily.   Marland Kitchen aspirin EC 81 MG tablet Take 81 mg by mouth every morning. Reported on 07/26/2015  . clopidogrel (PLAVIX) 75 MG tablet Take 75 mg by mouth every morning. Reported on 07/26/2015  . colchicine 0.6 MG tablet   . diclofenac (VOLTAREN) 75 MG EC tablet   . divalproex (DEPAKOTE ER) 500 MG 24 hr tablet Take 1 tablet (500 mg total) by mouth 3 (three) times daily. Take 526m in morning and 10067min evening (Patient taking differently: Take 500-1,000 mg by mouth 2 (two) times daily. Take 50065mn morning and 1000m48m evening)  . DULoxetine (CYMBALTA) 60 MG capsule Take 1 capsule (60 mg total) by mouth daily.  . esMarland Kitchenmeprazole (NEXIUM) 40 MG capsule Take 40 mg by mouth daily.   . fenofibrate (TRICOR) 145 MG tablet Take by mouth.  . furosemide (LASIX) 40 MG tablet Take 1 tablet (40 mg total) by mouth daily.  . glMarland Kitchencose 4 GM chewable tablet Chew 1 tablet by mouth as needed for low blood sugar.  . insulin aspart (NOVOLOG) 100 UNIT/ML injection Inject 12-20 Units into the skin 3 (three) times daily with meals. Reported on 10/11/2015  . Insulin Disposable Pump (V-GO 30) KIT Reported on 06/29/2015  . isosorbide mononitrate (IMDUR) 30 MG 24 hr tablet Take 1 tablet (30 mg total) by mouth daily. (Patient taking differently: Take 60 mg by mouth daily. )  . levothyroxine (SYNTHROID,  LEVOTHROID) 50 MCG tablet Take 50 mcg by mouth daily before breakfast.  . metFORMIN (GLUCOPHAGE) 1000 MG tablet Take 1,000 mg by mouth 2 (two) times daily with a meal.  . metoprolol succinate (TOPROL-XL) 50 MG 24 hr tablet Take 50 mg by mouth daily.   .  nitroGLYCERIN (NITROSTAT) 0.4 MG SL tablet Place 0.4 mg under the tongue every 5 (five) minutes x 3 doses as needed for chest pain.   . pioglitazone (ACTOS) 15 MG tablet Take 15 mg by mouth every morning.   . pregabalin (LYRICA) 100 MG capsule Take 100 mg by mouth 3 (three) times daily.  Marland Kitchen rOPINIRole (REQUIP) 1 MG tablet Take 1 mg by mouth 2 (two) times daily.   . rosuvastatin (CRESTOR) 5 MG tablet Take 5 mg by mouth daily at 6 PM.   . VICTOZA 18 MG/3ML SOPN Inject 1.8 mg into the skin daily.   . vitamin C (ASCORBIC ACID) 500 MG tablet Take 500 mg by mouth daily.    FAMILY HISTORY:  His indicated that his mother is deceased. He indicated that his father is deceased. He indicated that his sister is deceased. He indicated that his brother is deceased.    SOCIAL HISTORY: He  reports that he has never smoked. He has never used smokeless tobacco. He reports that he does not drink alcohol or use drugs.  REVIEW OF SYSTEMS:   Review of Systems  Constitutional: Negative for chills.  HENT: Negative for ear discharge and ear pain.   Eyes: Negative for double vision, photophobia and discharge.  Respiratory: Negative for sputum production and shortness of breath.   Neurological: Negative for tremors and sensory change.  Psychiatric/Behavioral: Positive for depression and suicidal ideas. Negative for hallucinations and substance abuse. The patient is not nervous/anxious.      SUBJECTIVE:  "Patient states that he is just tired of being sick"  VITAL SIGNS: BP (!) 145/53   Pulse 68   Temp 98 F (36.7 C) (Oral)   Resp (!) 23   Ht _0  (1.88 m)   Wt 130.6 kg (288 lb)   SpO2 97%   BMI 36.98 kg/m   HEMODYNAMICS:    VENTILATOR SETTINGS:    INTAKE / OUTPUT: No intake/output data recorded.  PHYSICAL EXAMINATION: General: Caucasian male, found on the stretcher, appears to be in no distress Neuro: Awake, alert, oriented, follows commands, no focal deficits HEENT: Atraumatic,  normocephalic, no JVD, no discharge Cardiovascular:  S1 and S2, regular ,rate and rhythm, no MRG noted Lungs:  Clear bilaterally, no wheezes crackles or rhonchi noted Abdomen:  Obese, soft, nontender, active bowel sounds Musculoskeletal: Callous present, no in deformity noted Skin:  Grossly intact  LABS:  BMET  Recent Labs Lab 02/01/16 2359  NA 141  K 3.6  CL 105  CO2 28  BUN 31*  CREATININE 1.57*  GLUCOSE 51*    Electrolytes  Recent Labs Lab 02/01/16 2359  CALCIUM 9.3    CBC  Recent Labs Lab 02/01/16 2359  WBC 8.3  HGB 11.5*  HCT 35.5*  PLT 213    Coag's No results for input(s): APTT, INR in the last 168 hours.  Sepsis Markers No results for input(s): LATICACIDVEN, PROCALCITON, O2SATVEN in the last 168 hours.  ABG No results for input(s): PHART, PCO2ART, PO2ART in the last 168 hours.  Liver Enzymes  Recent Labs Lab 02/01/16 2359  AST 34  ALT 21  ALKPHOS 54  BILITOT 0.6  ALBUMIN 3.8    Cardiac Enzymes No  results for input(s): TROPONINI, PROBNP in the last 168 hours.  Glucose  Recent Labs Lab 01/31/16 0743 01/31/16 0828 01/31/16 0931 02/02/16 0000 02/02/16 0044 02/02/16 0123  GLUCAP 73 112* 109* 69 59* 97    Imaging No results found.   STUDIES:  None  CULTURES: None  ANTIBIOTICS: None  SIGNIFICANT EVENTS: 02/02/16>> patient admitted to the intensive care unit due to intentional insulin overdose  LINES/TUBES: None  DISCUSSION: 65 year old male admitted with intentional Humalog overdose(300 units)  ASSESSMENT / PLAN:   NEUROLOGIC A:   Intentional attempt of suicide Bipolar disorder Anxiety mood disorder Insomnia History of borderline personality disorder neuropathy pain  P:   Involuntary committed Psych Consult Continue requip Continue Lyrica/Cymbalta      PULMONARY A: History of obstructive sleep apnea P:   Support with oxygen if needed, currently on room air  CARDIOVASCULAR A:  History of  CAD History of CHF Hypertension  P:  Continuous telemetry Continue Amlodipine/Isordil Continue aspirin Continue Plavix Continue Crestor/fenofibrate  RENAL A:   Acute kidney injury P:   Follow chemistry Replace electrolytes per ICU protocol  GASTROINTESTINAL A:   History of GERD P:   Continue famotidine  HEMATOLOGIC A:   No active issues P:  Heparin for DVT prophylaxis SCDs  INFECTIOUS A:   No active issues P:   Monitor fever curve   ENDOCRINE A:   hypoglycemia Hypothyroidism Diabetes mellitus P:   Blood sugar checks every hourly, target BS >120 D 10 GTT Glucagon if impaired level of consciousness D50 as needed Hold pioglitazone/metformin  Hold insulin aspart  Bincy Varughese,AG-ACNP Pulmonary & Critical Care Pulmonary and [place]   02/02/2016, 1:56 AM  STAFF NOTE: I, Dr. Vilinda Boehringer have personally reviewed patient's available data, including medical history, events of note, physical examination and test results as part of my evaluation. I have discussed with NP Varughese  and other care providers such as pharmacist, RN and RRT.    HPI:  65 year old past medical history of coronary artery disease, deression, diabetes type 2, acid reflux disease, hypertension, gout, OSA, hypothyroidism, admitted for intentional insulin overdose. Patient states he has been having a difficult time with his many medical problems, and lastnight got tired of it, then took about  300 units of insulin, shortly afterward he stated that he realized what he did, and "was not ready to go out this way."He called EMS, blood sugar was noted to be 60, then 40, at which point 1 ampule of D50 was given. In the ER he was started on a D10 drip and was transferred to the ICU for further monitoring. He has had intermittent episodes of hypoglycemia less than 70 in the ICU, had to be given an amp of D50. This morning he complains of mild leg cramps otherwise no  other Significant complaint   A:65 yo with intentional insulin overdose Insulin overdose DM 2 GERD CAD Depression OSA on autopap Hypothyroidism   P:  - cont with D10 gtt, in DM pts, hypoglycemia is <70 mg/dL, intermittent D50 as needed, resume diet - sitter at bedside - Psych conslt - monitor electorlytes - K, Phos, Na - these can all be low (Na with pseudohyponatremia, due to Na and water retention)    .  Rest per NP/medical resident whose note is outlined above and that I agree with  The patient is critically ill with multiple organ systems failure and requires high complexity decision making for assessment and support, frequent evaluation and titration of  therapies, application of advanced monitoring technologies and extensive interpretation of multiple databases.   Critical Care Time devoted to patient care services described in this note is 45 Minutes.   This time reflects time of care of this signee Dr Vilinda Boehringer.  This critical care time does not reflect procedure time, or teaching time or supervisory time of PA/NP/Med-student/Med Resident etc but could involve care discussion time.  Vilinda Boehringer, MD [place] [facility] Pager [phone] (please enter 7-digits) On Call Pager [phone] (please enter 7-digits)  Note: This note was prepared with Dragon dictation along with smaller phrase technology. Any transcriptional errors that result from this process are unintentional.

## 2016-02-02 NOTE — Plan of Care (Signed)
Problem: Nutrition: Goal: Adequate nutrition will be maintained Outcome: Progressing BP (!) 124/53   Pulse (!) 55   Temp 97.9 F (36.6 C) (Oral)   Resp 12   Ht 6\' 2"  (1.88 m)   Wt 129.5 kg (285 lb 7.9 oz)   SpO2 100%   BMI 36.66 kg/m   POC reviewed with patient, cont on self reposition, vital signs closely monitored and any concerns addressed at this time. Will continue to monitor.  Cont on suicide precautions, 1:1 sitter  CMP Latest Ref Rng & Units 02/02/2016 02/01/2016 12/20/2015  Glucose 65 - 99 mg/dL 63(L) 51(L) 278(H)  BUN 6 - 20 mg/dL 32(H) 31(H) 25(H)  Creatinine 0.61 - 1.24 mg/dL 1.42(H) 1.57(H) 1.43(H)  Sodium 135 - 145 mmol/L 138 141 135  Potassium 3.5 - 5.1 mmol/L 3.8 3.6 4.6  Chloride 101 - 111 mmol/L 106 105 102  CO2 22 - 32 mmol/L 25 28 24   Calcium 8.9 - 10.3 mg/dL 9.0 9.3 8.8(L)  Total Protein 6.5 - 8.1 g/dL - 7.7 -  Total Bilirubin 0.3 - 1.2 mg/dL - 0.6 -  Alkaline Phos 38 - 126 U/L - 54 -  AST 15 - 41 U/L - 34 -  ALT 17 - 63 U/L - 21 -

## 2016-02-02 NOTE — Consult Note (Signed)
[facility] Face-to-Face Psychiatry Consult   Reason for Consult:  Consult for 65 year old man with a history of chronic mental health problems. Currently in the intensive care unit after overdosing intentionally on insulin. Referring Physician:  Mungal Patient Identification: Dennis Mullen MRN:  [id number] Principal Diagnosis: <principal problem not specified> Diagnosis:   Patient Active Problem List   Diagnosis Date Noted  . Insulin overdose [T38.3X1A] 02/02/2016  . Hypoglycemia [E16.2]   . OSA on CPAP [G47.33, Z99.89]   . Depression [F32.9]   . Hypophosphatemia [E83.39]   . SOB (shortness of breath) [R06.02] 10/11/2015  . Elevated troponin [R74.8] 10/11/2015  . Chest pain at rest [R07.9] 09/24/2015  . Elevated PSA [R97.20] 07/26/2015  . Hypogonadism in male [E29.1] 07/26/2015  . 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 BPH with obstruction/lower urinary tract symptoms [N40.1, N13.8] 06/29/2015  . Bipolar 1 disorder, mixed ([place]) [F31.60] 04/14/2015  . Borderline personality disorder [F60.3] 04/14/2015  . Acute renal failure ([employer]) [N17.9] 11/15/2014  . Chest pain [R07.9] 11/14/2014  . Dizziness [R42] 11/05/2014  . Benign essential HTN [I10] 11/04/2014  . 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 Bipolar 2 disorder ([place]) [F31.81] 10/27/2014  . Acquired hypothyroidism [E03.9] 02/03/2014  . Type 2 diabetes mellitus ([place]) [E11.9] 02/03/2014  . Adiposity [E66.9] 01/06/2014  . Obesity, diabetes, and hypertension syndrome ([place]) [E11.9, I10, E66.9] 12/23/2013  . Long term current use of insulin ([place]) [Z79.4] 12/23/2013  . Type 2 diabetes mellitus with other diabetic neurological complication [Q22.97] 98/92/1194  . Disorder affecting the body's metabolism [E88.9] 12/23/2013  . Acute inflammation of the pancreas [K85.90] 11/18/2013  . Elevated cholesterol with elevated triglycerides [E78.2] 11/18/2013  . CAD (coronary artery disease),  native coronary artery [I25.10] 09/26/2013  . Diabetes mellitus ([place]) [E11.9] 09/26/2013  . Acute non-ST elevation myocardial infarction (NSTEMI) ([place]) [I21.4] 09/26/2013  . Non-ST elevation (NSTEMI) myocardial infarction [facility]) [I21.4] 09/26/2013    Total Time spent with patient: 1 hour  Subjective:   Dennis Mullen is a 65 y.o. male patient admitted with "I just had a meltdown".  HPI:  Patient interviewed. Chart reviewed. The patient is well known to me as he is a long-standing outpatient of mine. 65 year old man reports that yesterday evening he intentionally injected himself with about 300 units of insulin that he extracted from his insulin pump cartridges. Within about 30-40 minutes afterwards he was feeling sick and he called 911 himself and has been cooperative with medical treatment. Patient says it was an impulsive decision although his mood has been getting worse for a few weeks. He had spoken to her therapist earlier in the day who had noted that he seemed more depressed. Patient reports multiple stresses including his medical problems and his frustration with how the care is going and his chronic pain. He also sounds like he had a difficult interaction with a friend of his yesterday afternoon. On interview today the patient is no longer having any plan or intention to try and kill himself. He does not report any psychotic symptoms. He does not drink or use drugs. He has been compliant with recommended outpatient treatment and medicine.  Social history: Patient lives by himself. He has an adult son in the area and he is on reasonably good terms with him. He has some friendships with people who live near him although these tend to be emotionally stressful as well.  Medical history: Patient has diabetes and a history of coronary artery disease. He is  currently recovering from an overdose of insulin.  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 He has made an impulsive suicide attempts such as this in the past. He sees me for outpatient medication management and had been making plans to start seeing a therapist. He has had hospitalizations in the past. He had been reasonably stable for the last couple years that I've been seeing him and tolerates and benefits from his combination of Depakote and an antidepressant  Risk to Self: Is patient at risk for suicide?: Yes Risk to Others:   Prior Inpatient Therapy:   Prior Outpatient Therapy:    Past Medical History:  Past Medical History:  Diagnosis Date  . Abnormal levels of other serum enzymes   . Anxiety   . Bipolar disorder ([place])   . BPH (benign prostatic hyperplasia)   . CAD (coronary artery disease)   . CHF (congestive heart failure) ([place])    ?  [place] Kitchen Depression   . Diabetes mellitus, type II ([place])   . Diabetes mellitus, type II, insulin dependent ([place])   . ED (erectile dysfunction)   . GERD (gastroesophageal reflux disease)   . Gout   . History of borderline personality disorder   . Hypertension   . Hypogonadism in male   . Insomnia   . Mood disorder ([place])   . Neuropathy ([place])   . Obesity   . OSA (obstructive sleep apnea)   . PVD (peripheral vascular disease) ([place])   . Thyroid disease     Past Surgical History:  Procedure Laterality Date  . CARDIAC CATHETERIZATION N/A 10/12/2015   Procedure: Left Heart Cath and Coronary Angiography;  Surgeon: Teodoro Spray, MD;  Location: [facility];  Service: Cardiovascular;  Laterality: N/A;  . CORONARY ANGIOPLASTY WITH STENT PLACEMENT     x5 stents  . FOOT SURGERY    . MASTOIDECTOMY Right   . NASAL SEPTUM SURGERY    . penial inplant    . PENILE PROSTHESIS IMPLANT    . right mastoidectomy    . SCROTOPLASTY    . TONSILLECTOMY AND ADENOIDECTOMY     Family History:  Family  History  Problem Relation Age of Onset  . Lung cancer Mother   . Heart attack Father   . Post-traumatic stress disorder Father   . Anxiety disorder Father   . Depression Father   . Heart attack Sister   . Heart attack Brother    Family Psychiatric  History: History of anxiety and depression Social History:  History  Alcohol Use No     History  Drug Use No    Social History   Social History  . Marital status: Single    Spouse name: N/A  . Number of children: N/A  . Years of education: N/A   Occupational History  . retired    Social History Main Topics  . Smoking status: Never Smoker  . Smokeless tobacco: Never Used  . Alcohol use No  . Drug use: No  . Sexual activity: Not Currently    Birth control/ protection: None   Other Topics Concern  . None   Social History Narrative   ** Merged History Encounter **       Additional Social History:    Allergies:  No Known Allergies  Labs:  Results for orders  placed or performed during the hospital encounter of 02/01/16 (from the past 48 hour(s))  CBC with Differential     Status: Abnormal   Collection Time: 02/01/16 11:59 PM  Result Value Ref Range   WBC 8.3 3.8 - 10.6 K/uL   RBC 4.11 (L) 4.40 - 5.90 MIL/uL   Hemoglobin 11.5 (L) 13.0 - 18.0 g/dL   HCT 35.5 (L) 40.0 - 52.0 %   MCV 86.5 80.0 - 100.0 fL   MCH 27.9 26.0 - 34.0 pg   MCHC 32.3 32.0 - 36.0 g/dL   RDW 16.6 (H) 11.5 - 14.5 %   Platelets 213 150 - 440 K/uL   Neutrophils Relative % 71 %   Neutro Abs 5.8 1.4 - 6.5 K/uL   Lymphocytes Relative 20 %   Lymphs Abs 1.7 1.0 - 3.6 K/uL   Monocytes Relative 8 %   Monocytes Absolute 0.7 0.2 - 1.0 K/uL   Eosinophils Relative 1 %   Eosinophils Absolute 0.1 0 - 0.7 K/uL   Basophils Relative 0 %   Basophils Absolute 0.0 0 - 0.1 K/uL  Comprehensive metabolic panel     Status: Abnormal   Collection Time: 02/01/16 11:59 PM  Result Value Ref Range   Sodium 141 135 - 145 mmol/L   Potassium 3.6 3.5 - 5.1 mmol/L    Chloride 105 101 - 111 mmol/L   CO2 28 22 - 32 mmol/L   Glucose, Bld 51 (L) 65 - 99 mg/dL   BUN 31 (H) 6 - 20 mg/dL   Creatinine, Ser 1.57 (H) 0.61 - 1.24 mg/dL   Calcium 9.3 8.9 - 10.3 mg/dL   Total Protein 7.7 6.5 - 8.1 g/dL   Albumin 3.8 3.5 - 5.0 g/dL   AST 34 15 - 41 U/L   ALT 21 17 - 63 U/L   Alkaline Phosphatase 54 38 - 126 U/L   Total Bilirubin 0.6 0.3 - 1.2 mg/dL   GFR calc non Af Amer 45 (L) >60 mL/min   GFR calc Af Amer 52 (L) >60 mL/min    Comment: (NOTE) The eGFR has been calculated using the CKD EPI equation. This calculation has not been validated in all clinical situations. eGFR's persistently <60 mL/min signify possible Chronic Kidney Disease.    Anion gap 8 5 - 15  Acetaminophen level     Status: Abnormal   Collection Time: 02/01/16 11:59 PM  Result Value Ref Range   Acetaminophen (Tylenol), Serum <10 (L) 10 - 30 ug/mL    Comment:        THERAPEUTIC CONCENTRATIONS VARY SIGNIFICANTLY. A RANGE OF 10-30 ug/mL MAY BE AN EFFECTIVE CONCENTRATION FOR MANY PATIENTS. HOWEVER, SOME ARE BEST TREATED AT CONCENTRATIONS OUTSIDE THIS RANGE. ACETAMINOPHEN CONCENTRATIONS >150 ug/mL AT 4 HOURS AFTER INGESTION AND >50 ug/mL AT 12 HOURS AFTER INGESTION ARE OFTEN ASSOCIATED WITH TOXIC REACTIONS.   Salicylate level     Status: None   Collection Time: 02/01/16 11:59 PM  Result Value Ref Range   Salicylate Lvl <4.0 2.8 - 30.0 mg/dL  Ethanol     Status: None   Collection Time: 02/01/16 11:59 PM  Result Value Ref Range   Alcohol, Ethyl (B) <5 <5 mg/dL    Comment:        LOWEST DETECTABLE LIMIT FOR SERUM ALCOHOL IS 5 mg/dL FOR MEDICAL PURPOSES ONLY   Glucose, capillary     Status: None   Collection Time: 02/02/16 12:00 AM  Result Value Ref Range   Glucose-Capillary 69 65 - 99  mg/dL  Glucose, capillary     Status: Abnormal   Collection Time: 02/02/16 12:44 AM  Result Value Ref Range   Glucose-Capillary 59 (L) 65 - 99 mg/dL  Glucose, capillary     Status: None    Collection Time: 02/02/16  1:23 AM  Result Value Ref Range   Glucose-Capillary 97 65 - 99 mg/dL  Glucose, capillary     Status: Abnormal   Collection Time: 02/02/16  2:10 AM  Result Value Ref Range   Glucose-Capillary 168 (H) 65 - 99 mg/dL  Glucose, capillary     Status: Abnormal   Collection Time: 02/02/16  2:54 AM  Result Value Ref Range   Glucose-Capillary 141 (H) 65 - 99 mg/dL  MRSA PCR Screening     Status: None   Collection Time: 02/02/16  3:10 AM  Result Value Ref Range   MRSA by PCR NEGATIVE NEGATIVE    Comment:        The GeneXpert MRSA Assay (FDA approved for NASAL specimens only), is one component of a comprehensive MRSA colonization surveillance program. It is not intended to diagnose MRSA infection nor to guide or monitor treatment for MRSA infections.   Glucose, capillary     Status: None   Collection Time: 02/02/16  3:58 AM  Result Value Ref Range   Glucose-Capillary 93 65 - 99 mg/dL   Comment 1 Notify RN   CBC     Status: Abnormal   Collection Time: 02/02/16  4:12 AM  Result Value Ref Range   WBC 7.4 3.8 - 10.6 K/uL   RBC 3.87 (L) 4.40 - 5.90 MIL/uL   Hemoglobin 10.8 (L) 13.0 - 18.0 g/dL   HCT 33.5 (L) 40.0 - 52.0 %   MCV 86.5 80.0 - 100.0 fL   MCH 28.0 26.0 - 34.0 pg   MCHC 32.3 32.0 - 36.0 g/dL   RDW 16.6 (H) 11.5 - 14.5 %   Platelets 197 150 - 440 K/uL  Basic metabolic panel     Status: Abnormal   Collection Time: 02/02/16  4:12 AM  Result Value Ref Range   Sodium 138 135 - 145 mmol/L   Potassium 3.8 3.5 - 5.1 mmol/L   Chloride 106 101 - 111 mmol/L   CO2 25 22 - 32 mmol/L   Glucose, Bld 63 (L) 65 - 99 mg/dL   BUN 32 (H) 6 - 20 mg/dL   Creatinine, Ser 1.42 (H) 0.61 - 1.24 mg/dL   Calcium 9.0 8.9 - 10.3 mg/dL   GFR calc non Af Amer 51 (L) >60 mL/min   GFR calc Af Amer 59 (L) >60 mL/min    Comment: (NOTE) The eGFR has been calculated using the CKD EPI equation. This calculation has not been validated in all clinical situations. eGFR's  persistently <60 mL/min signify possible Chronic Kidney Disease.    Anion gap 7 5 - 15  Magnesium     Status: None   Collection Time: 02/02/16  4:12 AM  Result Value Ref Range   Magnesium 1.9 1.7 - 2.4 mg/dL  Phosphorus     Status: Abnormal   Collection Time: 02/02/16  4:12 AM  Result Value Ref Range   Phosphorus 2.3 (L) 2.5 - 4.6 mg/dL  Glucose, capillary     Status: Abnormal   Collection Time: 02/02/16  4:59 AM  Result Value Ref Range   Glucose-Capillary 44 (LL) 65 - 99 mg/dL   Comment 1 Notify RN   Glucose, capillary     Status: None  Collection Time: 02/02/16  5:18 AM  Result Value Ref Range   Glucose-Capillary 99 65 - 99 mg/dL   Comment 1 Notify RN   Glucose, capillary     Status: Abnormal   Collection Time: 02/02/16  5:58 AM  Result Value Ref Range   Glucose-Capillary 126 (H) 65 - 99 mg/dL   Comment 1 Notify RN   Glucose, capillary     Status: Abnormal   Collection Time: 02/02/16  6:54 AM  Result Value Ref Range   Glucose-Capillary 118 (H) 65 - 99 mg/dL   Comment 1 Notify RN   Urine Drug Screen, Qualitative ([facility] only)     Status: None   Collection Time: 02/02/16  7:04 AM  Result Value Ref Range   Tricyclic, Ur Screen NONE DETECTED NONE DETECTED   Amphetamines, Ur Screen NONE DETECTED NONE DETECTED   MDMA (Ecstasy)Ur Screen NONE DETECTED NONE DETECTED   Cocaine Metabolite,Ur [place] NONE DETECTED NONE DETECTED   Opiate, Ur Screen NONE DETECTED NONE DETECTED   Phencyclidine (PCP) Ur S NONE DETECTED NONE DETECTED   Cannabinoid 50 Ng, Ur [place] NONE DETECTED NONE DETECTED   Barbiturates, Ur Screen NONE DETECTED NONE DETECTED   Benzodiazepine, Ur Scrn NONE DETECTED NONE DETECTED   Methadone Scn, Ur NONE DETECTED NONE DETECTED    Comment: (NOTE) 157  Tricyclics, urine               Cutoff 1000 ng/mL 200  Amphetamines, urine             Cutoff 1000 ng/mL 300  MDMA (Ecstasy), urine           Cutoff 500 ng/mL 400  Cocaine Metabolite, urine       Cutoff 300 ng/mL 500  Opiate,  urine                   Cutoff 300 ng/mL 600  Phencyclidine (PCP), urine      Cutoff 25 ng/mL 700  Cannabinoid, urine              Cutoff 50 ng/mL 800  Barbiturates, urine             Cutoff 200 ng/mL 900  Benzodiazepine, urine           Cutoff 200 ng/mL 1000 Methadone, urine                Cutoff 300 ng/mL 1100 1200 The urine drug screen provides only a preliminary, unconfirmed 1300 analytical test result and should not be used for non-medical 1400 purposes. Clinical consideration and professional judgment should 1500 be applied to any positive drug screen result due to possible 1600 interfering substances. A more specific alternate chemical method 1700 must be used in order to obtain a confirmed analytical result.  1800 Gas chromato graphy / mass spectrometry (GC/MS) is the preferred 1900 confirmatory method.   Glucose, capillary     Status: Abnormal   Collection Time: 02/02/16  8:01 AM  Result Value Ref Range   Glucose-Capillary 64 (L) 65 - 99 mg/dL  Glucose, capillary     Status: Abnormal   Collection Time: 02/02/16  9:01 AM  Result Value Ref Range   Glucose-Capillary 101 (H) 65 - 99 mg/dL  Glucose, capillary     Status: Abnormal   Collection Time: 02/02/16 10:00 AM  Result Value Ref Range   Glucose-Capillary 182 (H) 65 - 99 mg/dL  Glucose, capillary     Status: Abnormal   Collection Time: 02/02/16 10:57  AM  Result Value Ref Range   Glucose-Capillary 191 (H) 65 - 99 mg/dL  Glucose, capillary     Status: Abnormal   Collection Time: 02/02/16 12:04 PM  Result Value Ref Range   Glucose-Capillary 217 (H) 65 - 99 mg/dL  Basic metabolic panel     Status: Abnormal   Collection Time: 02/02/16 12:49 PM  Result Value Ref Range   Sodium 136 135 - 145 mmol/L   Potassium 4.0 3.5 - 5.1 mmol/L   Chloride 102 101 - 111 mmol/L   CO2 28 22 - 32 mmol/L   Glucose, Bld 203 (H) 65 - 99 mg/dL   BUN 27 (H) 6 - 20 mg/dL   Creatinine, Ser 1.24 0.61 - 1.24 mg/dL   Calcium 8.8 (L) 8.9 - 10.3  mg/dL   GFR calc non Af Amer >60 >60 mL/min   GFR calc Af Amer >60 >60 mL/min    Comment: (NOTE) The eGFR has been calculated using the CKD EPI equation. This calculation has not been validated in all clinical situations. eGFR's persistently <60 mL/min signify possible Chronic Kidney Disease.    Anion gap 6 5 - 15  Phosphorus     Status: None   Collection Time: 02/02/16 12:49 PM  Result Value Ref Range   Phosphorus 3.7 2.5 - 4.6 mg/dL  Valproic acid level     Status: Abnormal   Collection Time: 02/02/16 12:49 PM  Result Value Ref Range   Valproic Acid Lvl 25 (L) 50.0 - 100.0 ug/mL  Glucose, capillary     Status: Abnormal   Collection Time: 02/02/16  1:10 PM  Result Value Ref Range   Glucose-Capillary 228 (H) 65 - 99 mg/dL    Current Facility-Administered Medications  Medication Dose Route Frequency Provider Last Rate Last Dose  . 0.9 %  sodium chloride infusion  250 mL Intravenous PRN Bincy S Varughese, NP      . allopurinol (ZYLOPRIM) tablet 100 mg  100 mg Oral Daily Bincy S Varughese, NP   100 mg at 02/02/16 1036  . amLODipine (NORVASC) tablet 5 mg  5 mg Oral Daily Bincy S Varughese, NP   5 mg at 02/02/16 1036  . aspirin EC tablet 81 mg  81 mg Oral Daily Bincy S Varughese, NP   81 mg at 02/02/16 1036  . clopidogrel (PLAVIX) tablet 75 mg  75 mg Oral Daily Bincy S Varughese, NP   75 mg at 02/02/16 1036  . dextrose 10 % infusion   Intravenous Continuous Vishal Mungal, MD 100 mL/hr at 02/02/16 1116    . dextrose 50 % solution 25 mL  25 mL Intravenous PRN Holley Raring, NP   25 mL at 02/02/16 0812  . divalproex (DEPAKOTE ER) 24 hr tablet 1,000 mg  1,000 mg Oral QHS Gonzella Lex, MD      . divalproex (DEPAKOTE ER) 24 hr tablet 500 mg  500 mg Oral Daily Julizza Sassone T Carollyn Etcheverry, MD      . DULoxetine (CYMBALTA) DR capsule 60 mg  60 mg Oral Daily Bincy S Varughese, NP   60 mg at 02/02/16 1036  . famotidine (PEPCID) tablet 40 mg  40 mg Oral Daily Bincy S Varughese, NP   40 mg at 02/02/16 1036   . fenofibrate (TRICOR) tablet 145 mg  145 mg Oral Daily Bincy S Varughese, NP   145 mg at 02/02/16 1038  . glucagon (human recombinant) (GLUCAGEN) injection 1 mg  1 mg Intravenous Once PRN Holley Raring, NP      .  heparin injection 5,000 Units  5,000 Units Subcutaneous Q8H Bincy S Varughese, NP      . isosorbide mononitrate (IMDUR) 24 hr tablet 30 mg  30 mg Oral Daily Bincy S Varughese, NP   30 mg at 02/02/16 1036  . levothyroxine (SYNTHROID, LEVOTHROID) tablet 50 mcg  50 mcg Oral QAC breakfast Holley Raring, NP   50 mcg at 02/02/16 0837  . metoprolol succinate (TOPROL-XL) 24 hr tablet 50 mg  50 mg Oral Daily Vishal Mungal, MD   50 mg at 02/02/16 1036  . pregabalin (LYRICA) capsule 100 mg  100 mg Oral TID Bincy S Varughese, NP   100 mg at 02/02/16 1038  . rOPINIRole (REQUIP) tablet 1 mg  1 mg Oral BID Bincy S Varughese, NP   1 mg at 02/02/16 1037  . rosuvastatin (CRESTOR) tablet 5 mg  5 mg Oral q1800 Bincy S Varughese, NP      . vitamin C (ASCORBIC ACID) tablet 500 mg  500 mg Oral Daily Bincy S Varughese, NP   500 mg at 02/02/16 1038    Musculoskeletal: Strength & Muscle Tone: within normal limits Gait & Station: normal Patient leans: N/A  Psychiatric Specialty Exam: Physical Exam  Nursing note and vitals reviewed. Constitutional: He appears well-developed and well-nourished.  HENT:  Head: Normocephalic and atraumatic.  Eyes: Conjunctivae are normal. Pupils are equal, round, and reactive to light.  Neck: Normal range of motion.  Cardiovascular: Regular rhythm and normal heart sounds.   Respiratory: Effort normal. No respiratory distress.  GI: Soft.  Musculoskeletal: Normal range of motion.  Neurological: He is alert.  Skin: Skin is warm and dry.  Psychiatric: He has a normal mood and affect. His speech is normal and behavior is normal. Cognition and memory are normal. He expresses impulsivity. He expresses no suicidal ideation.    Review of Systems  Constitutional:  Negative.   HENT: Negative.   Eyes: Negative.   Respiratory: Negative.   Cardiovascular: Negative.   Gastrointestinal: Negative.   Musculoskeletal: Positive for joint pain.  Skin: Negative.   Neurological: Negative.   Psychiatric/Behavioral: Positive for depression and suicidal ideas. Negative for hallucinations, memory loss and substance abuse. The patient is not nervous/anxious and does not have insomnia.     Blood pressure (!) 124/53, pulse (!) 55, temperature 97.9 F (36.6 C), temperature source Oral, resp. rate 12, height 6' 2"  (1.88 m), weight 129.5 kg (285 lb 7.9 oz), SpO2 100 %.Body mass index is 36.66 kg/m.  General Appearance: Casual  Eye Contact:  Good  Speech:  Clear and Coherent  Volume:  Normal  Mood:  Anxious and Euthymic  Affect:  Appropriate and Congruent  Thought Process:  Goal Directed  Orientation:  Full (Time, Place, and Person)  Thought Content:  Logical  Suicidal Thoughts:  Yes.  without intent/plan  Homicidal Thoughts:  No  Memory:  Immediate;   Good Recent;   Fair Remote;   Good  Judgement:  Fair  Insight:  Present  Psychomotor Activity:  Decreased  Concentration:  Concentration: Fair  Recall:  [employer] of Knowledge:  Fair  Language:  Fair  Akathisia:  No  Handed:  Right  AIMS (if indicated):     Assets:  Communication Skills Desire for Improvement Financial Resources/Insurance Housing Resilience  ADL's:  Intact  Cognition:  WNL  Sleep:        Treatment Plan Summary: Daily contact with patient to assess and evaluate symptoms and progress in treatment, Medication management and Plan  This is a 64 year old man with chronic mental health problems characterized by a tendency to chronic impulsivity and emotional dyscontrol. He took an overdose of insulin yesterday but then notably immediately called for help. He is not actually at this point endorsing a desire to die. He is able to acknowledge the multiple stresses in his life. Talking to him  this afternoon he is back to his normal baseline mental state in every way. I don't think he meets commitment criteria or requires inpatient hospital treatment. I have restarted his Depakote at his usual dose of 500 mg in the morning and 1000 mg at night. Continue Cymbalta. I will follow-up as he remains in the hospital but I think he can probably be discharged home and will follow-up with me for medication management and is making plans to start seeing a therapist as well.  Disposition: Patient does not meet criteria for psychiatric inpatient admission. Supportive therapy provided about ongoing stressors.  Alethia Berthold, MD 02/02/2016 1:40 PM

## 2016-02-02 NOTE — Progress Notes (Addendum)
Inpatient Diabetes Program Recommendations  AACE/ADA: New Consensus Statement on Inpatient Glycemic Control (2015)  Target Ranges:  Prepandial:   less than 140 mg/dL      Peak postprandial:   less than 180 mg/dL (1-2 hours)      Critically ill patients:  140 - 180 mg/dL  Results for Dennis Mullen, Dennis Mullen (MRN [id number]) as of 02/02/2016 12:33  Ref. Range 02/02/2016 02:54 02/02/2016 03:58 02/02/2016 04:59 02/02/2016 05:18 02/02/2016 05:58 02/02/2016 06:54 02/02/2016 08:01 02/02/2016 09:01 02/02/2016 10:00 02/02/2016 10:57 02/02/2016 12:04  Glucose-Capillary Latest Ref Range: 65 - 99 mg/dL 141 (H) 93 44 (LL) 99 126 (H) 118 (H) 64 (L) 101 (H) 182 (H) 191 (H) 217 (H)    Review of Glycemic Control  Diabetes history: DM2 Outpatient Diabetes medications: V-Go 20 with Humalog (provides 20 units of basal insulin over 24 hours; uses 2-6 clicks with meals in which each click is 2 units of insulin), Victoza 1.2 mg daily, Metformin 1000 mg BID, Actos 15 mg daily Current orders for Inpatient glycemic control: CBGs Q1H  Inpatient Diabetes Program Recommendations: IV fluids: Please consider discontinuing dextrose from IV fluids since patient is tolerating PO intake and glucose is on steady rise. Insulin - Basal: Once dextrose is removed from IV fluids and Novolog correction is being used, once CBGs are consistently greater than 180 mg/dl (without any hypoglycemia treatment), may want to consider ordering low dose basal insulin. If so, would recommend starting with Lantus 13 units Q24H (based on 129 kg x 0.1 units). Correction (SSI): Once glucose is consistently greater than 140 mg/dl without dextrose in IV fluids, please consider ordering CBGs with Novolog moderate correction scale Q4H.  NOTE: Patient has DM2 and is followed by Dr. Eddie Dibbles for DM control. Patient last seen Dr. Eddie Dibbles on 11/25/15. Noted patient took Humalog 300 units for suicide attempt and was initially hypoglycemic. CBGs are now on a steady rise  and patient is tolerating PO intake. Recommend discontinuing dextrose from IV fluids and if CBGs is consistently greater than 140 mg/dl then order CBGs with Novolog moderate correction scale Q4H. Then once dextrose removed from IV fluids and patient is ordered Novolog correction Q4H; if CBGs consistently greater than 180 mg/dl, would recommend ordering low dose basal insulin.    Thanks, Barnie Alderman, RN, MSN, CDE Diabetes Coordinator Inpatient Diabetes Program [phone] (Team Pager from [place] to [place]) [phone] ([facility] office) [phone] [facility] office) [phone] [facility] office)

## 2016-02-02 NOTE — Consult Note (Signed)
Brief note. Full note follow. Involuntary commitment paperwork discontinued. Reinstated his outpatient Depakote.

## 2016-02-02 NOTE — Care Management (Signed)
Patient was very quiet, but receptive. He does not want anything and also looks uncomfortable in conversation, perhaps it can be attributed to being in the hospital. The patient does have 1 on 1 and states that he is 'okay'. I will brief the lead Chaplain for the unit and have him follow up later.

## 2016-02-03 LAB — BASIC METABOLIC PANEL
Anion gap: 5 (ref 5–15)
BUN: 25 mg/dL — AB (ref 6–20)
CHLORIDE: 106 mmol/L (ref 101–111)
CO2: 28 mmol/L (ref 22–32)
CREATININE: 1.32 mg/dL — AB (ref 0.61–1.24)
Calcium: 8.8 mg/dL — ABNORMAL LOW (ref 8.9–10.3)
GFR calc non Af Amer: 56 mL/min — ABNORMAL LOW (ref >60)
Glucose, Bld: 107 mg/dL — ABNORMAL HIGH (ref 65–99)
POTASSIUM: 4.3 mmol/L (ref 3.5–5.1)
Sodium: 139 mmol/L (ref 135–145)

## 2016-02-03 LAB — GLUCOSE, CAPILLARY
Glucose-Capillary: 106 mg/dL — ABNORMAL HIGH (ref 65–99)
Glucose-Capillary: 102 mg/dL — ABNORMAL HIGH (ref 65–99)
Glucose-Capillary: 158 mg/dL — ABNORMAL HIGH (ref 65–99)
Glucose-Capillary: 160 mg/dL — ABNORMAL HIGH (ref 65–99)

## 2016-02-03 MED ORDER — AMOXICILLIN-POT CLAVULANATE 875-125 MG PO TABS
1.0000 | ORAL_TABLET | Freq: Two times a day (BID) | ORAL | Status: DC
  Administered 2016-02-03: 1 via ORAL
  Filled 2016-02-03: qty 1

## 2016-02-03 MED ORDER — AMOXICILLIN-POT CLAVULANATE 875-125 MG PO TABS: 1.0000 | ORAL_TABLET | Freq: Two times a day (BID) | ORAL | 0 refills | Status: DC

## 2016-02-03 NOTE — Progress Notes (Signed)
   Patient transferred to the Hospitalist service.  Spoke to Dr. Marcille Blanco. CCM Team is available if any further assistance is needed.  We will sign off on him.   [facility]

## 2016-02-03 NOTE — Progress Notes (Signed)
Pt placed on [facility] C-5 CPAP. CPAP plugged into red outlet 

## 2016-02-03 NOTE — Consult Note (Signed)
[facility] Face-to-Face Psychiatry Consult   Reason for Consult:  Consult for 65 year old man with a history of chronic mental health problems. Currently in the intensive care unit after overdosing intentionally on insulin. Referring Physician:  Mungal Patient Identification: Dennis Mullen MRN:  [id number] Principal Diagnosis: Bipolar 2 disorder [facility]) Diagnosis:   Patient Active Problem List   Diagnosis Date Noted  . Insulin overdose [T38.3X1A] 02/02/2016  . Hypoglycemia [E16.2]   . OSA on CPAP [G47.33, Z99.89]   . Depression [F32.9]   . Hypophosphatemia [E83.39]   . SOB (shortness of breath) [R06.02] 10/11/2015  . Elevated troponin [R74.8] 10/11/2015  . Chest pain at rest [R07.9] 09/24/2015  . Elevated PSA [R97.20] 07/26/2015  . Hypogonadism in male [E29.1] 07/26/2015  . 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 BPH with obstruction/lower urinary tract symptoms [N40.1, N13.8] 06/29/2015  . Bipolar 1 disorder, mixed ([place]) [F31.60] 04/14/2015  . Borderline personality disorder [F60.3] 04/14/2015  . Acute renal failure ([place]) [N17.9] 11/15/2014  . Chest pain [R07.9] 11/14/2014  . Dizziness [R42] 11/05/2014  . Benign essential HTN [I10] 11/04/2014  . 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 Bipolar 2 disorder ([place]) [F31.81] 10/27/2014  . Acquired hypothyroidism [E03.9] 02/03/2014  . Type 2 diabetes mellitus ([place]) [E11.9] 02/03/2014  . Adiposity [E66.9] 01/06/2014  . Obesity, diabetes, and hypertension syndrome ([place]) [E11.9, I10, E66.9] 12/23/2013  . Long term current use of insulin ([place]) [Z79.4] 12/23/2013  . Type 2 diabetes mellitus with other diabetic neurological complication [Y17.49] 44/96/7591  . Disorder affecting the body's metabolism [E88.9] 12/23/2013  . Acute inflammation of the pancreas [K85.90] 11/18/2013  . Elevated cholesterol with elevated triglycerides [E78.2] 11/18/2013  . CAD (coronary artery disease), native  coronary artery [I25.10] 09/26/2013  . Diabetes mellitus ([place]) [E11.9] 09/26/2013  . Acute non-ST elevation myocardial infarction (NSTEMI) ([place]) [I21.4] 09/26/2013  . Non-ST elevation (NSTEMI) myocardial infarction [facility]) [I21.4] 09/26/2013    Total Time spent with patient: 20 minutes   Follow-up note for this 65 year old man in the hospital after a overdose of insulin. Patient seen and chart reviewed. Patient says he is feeling better today. Mood is back to his baseline. Not feeling particularly depressed or hopeless. Not having any suicidal thoughts at all. Able to engage in appropriate conversation and show improved insight into his behavior. No evidence of psychosis. Has not been acting out dangerously and is able to articulate clear plans for managing himself going ahead into the future.  Subjective:   Dennis Mullen is a 65 y.o. male patient admitted with "I just had a meltdown".  HPI:  Patient interviewed. Chart reviewed. The patient is well known to me as he is a long-standing outpatient of mine. 65 year old man reports that yesterday evening he intentionally injected himself with about 300 units of insulin that he extracted from his insulin pump cartridges. Within about 30-40 minutes afterwards he was feeling sick and he called 911 himself and has been cooperative with medical treatment. Patient says it was an impulsive decision although his mood has been getting worse for a few weeks. He had spoken to her therapist earlier in the day who had noted that he seemed more depressed. Patient reports multiple stresses including his medical problems and his frustration with how the care is going and his chronic pain. He also sounds like he had a difficult interaction with a friend of his yesterday afternoon. On interview today the patient is no longer having any plan or intention to try  and kill himself. He does not report any psychotic symptoms. He does not drink or use drugs. He has been  compliant with recommended outpatient treatment and medicine.  Social history: Patient lives by himself. He has an adult son in the area and he is on reasonably good terms with him. He has some friendships with people who live near him although these tend to be emotionally stressful as well.  Medical history: Patient has diabetes and a history of coronary artery disease. He is currently recovering from an overdose of insulin.  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 He has made an impulsive suicide attempts such as this in the past. He sees me for outpatient medication management and had been making plans to start seeing a therapist. He has had hospitalizations in the past. He had been reasonably stable for the last couple years that I've been seeing him and tolerates and benefits from his combination of Depakote and an antidepressant  Risk to Self: Is patient at risk for suicide?: Yes Risk to Others:   Prior Inpatient Therapy:   Prior Outpatient Therapy:    Past Medical History:  Past Medical History:  Diagnosis Date  . Abnormal levels of other serum enzymes   . Anxiety   . Bipolar disorder ([place])   . BPH (benign prostatic hyperplasia)   . CAD (coronary artery disease)   . CHF (congestive heart failure) ([place])    ?  Marland Kitchen Depression   . Diabetes mellitus, type II ([place])   . Diabetes mellitus, type II, insulin dependent ([place])   . ED (erectile dysfunction)   . GERD (gastroesophageal reflux disease)   . Gout   . History of borderline personality disorder   . Hypertension   . Hypogonadism in male   . Insomnia   . Mood disorder ([place])   . Neuropathy ([place])   . Obesity   . OSA (obstructive sleep apnea)   . PVD (peripheral vascular disease) ([place])   . Thyroid disease     Past Surgical History:  Procedure  Laterality Date  . CARDIAC CATHETERIZATION N/A 10/12/2015   Procedure: Left Heart Cath and Coronary Angiography;  Surgeon: Teodoro Spray, MD;  Location: [place] CV [facility];  Service: Cardiovascular;  Laterality: N/A;  . CORONARY ANGIOPLASTY WITH STENT PLACEMENT     x5 stents  . FOOT SURGERY    . MASTOIDECTOMY Right   . NASAL SEPTUM SURGERY    . penial inplant    . PENILE PROSTHESIS IMPLANT    . right mastoidectomy    . SCROTOPLASTY    . TONSILLECTOMY AND ADENOIDECTOMY     Family History:  Family History  Problem Relation Age of Onset  . Lung cancer Mother   . Heart attack Father   . Post-traumatic stress disorder Father   . Anxiety disorder Father   . Depression Father   . Heart attack Sister   . Heart attack Brother    Family Psychiatric  History: History of anxiety and depression Social History:  History  Alcohol Use No     History  Drug Use No    Social History   Social History  . Marital status: Single    Spouse name: N/A  . Number of children: N/A  . Years of education: N/A   Occupational History  . retired  Social History Main Topics  . Smoking status: Never Smoker  . Smokeless tobacco: Never Used  . Alcohol use No  . Drug use: No  . Sexual activity: Not Currently    Birth control/ protection: None   Other Topics Concern  . None   Social History Narrative   ** Merged History Encounter **       Additional Social History:    Allergies:  No Known Allergies  Labs:  Results for orders placed or performed during the hospital encounter of 02/01/16 (from the past 48 hour(s))  CBC with Differential     Status: Abnormal   Collection Time: 02/01/16 11:59 PM  Result Value Ref Range   WBC 8.3 3.8 - 10.6 K/uL   RBC 4.11 (L) 4.40 - 5.90 MIL/uL   Hemoglobin 11.5 (L) 13.0 - 18.0 g/dL   HCT 35.5 (L) 40.0 - 52.0 %   MCV 86.5 80.0 - 100.0 fL   MCH 27.9 26.0 - 34.0 pg   MCHC 32.3 32.0 - 36.0 g/dL   RDW 16.6 (H) 11.5 - 14.5 %   Platelets 213 150 - 440  K/uL   Neutrophils Relative % 71 %   Neutro Abs 5.8 1.4 - 6.5 K/uL   Lymphocytes Relative 20 %   Lymphs Abs 1.7 1.0 - 3.6 K/uL   Monocytes Relative 8 %   Monocytes Absolute 0.7 0.2 - 1.0 K/uL   Eosinophils Relative 1 %   Eosinophils Absolute 0.1 0 - 0.7 K/uL   Basophils Relative 0 %   Basophils Absolute 0.0 0 - 0.1 K/uL  Comprehensive metabolic panel     Status: Abnormal   Collection Time: 02/01/16 11:59 PM  Result Value Ref Range   Sodium 141 135 - 145 mmol/L   Potassium 3.6 3.5 - 5.1 mmol/L   Chloride 105 101 - 111 mmol/L   CO2 28 22 - 32 mmol/L   Glucose, Bld 51 (L) 65 - 99 mg/dL   BUN 31 (H) 6 - 20 mg/dL   Creatinine, Ser 1.57 (H) 0.61 - 1.24 mg/dL   Calcium 9.3 8.9 - 10.3 mg/dL   Total Protein 7.7 6.5 - 8.1 g/dL   Albumin 3.8 3.5 - 5.0 g/dL   AST 34 15 - 41 U/L   ALT 21 17 - 63 U/L   Alkaline Phosphatase 54 38 - 126 U/L   Total Bilirubin 0.6 0.3 - 1.2 mg/dL   GFR calc non Af Amer 45 (L) >60 mL/min   GFR calc Af Amer 52 (L) >60 mL/min    Comment: (NOTE) The eGFR has been calculated using the CKD EPI equation. This calculation has not been validated in all clinical situations. eGFR's persistently <60 mL/min signify possible Chronic Kidney Disease.    Anion gap 8 5 - 15  Acetaminophen level     Status: Abnormal   Collection Time: 02/01/16 11:59 PM  Result Value Ref Range   Acetaminophen (Tylenol), Serum <10 (L) 10 - 30 ug/mL    Comment:        THERAPEUTIC CONCENTRATIONS VARY SIGNIFICANTLY. A RANGE OF 10-30 ug/mL MAY BE AN EFFECTIVE CONCENTRATION FOR MANY PATIENTS. HOWEVER, SOME ARE BEST TREATED AT CONCENTRATIONS OUTSIDE THIS RANGE. ACETAMINOPHEN CONCENTRATIONS >150 ug/mL AT 4 HOURS AFTER INGESTION AND >50 ug/mL AT 12 HOURS AFTER INGESTION ARE OFTEN ASSOCIATED WITH TOXIC REACTIONS.   Salicylate level     Status: None   Collection Time: 02/01/16 11:59 PM  Result Value Ref Range   Salicylate Lvl <3.2 2.8 - 30.0  mg/dL  Ethanol     Status: None   Collection  Time: 02/01/16 11:59 PM  Result Value Ref Range   Alcohol, Ethyl (B) <5 <5 mg/dL    Comment:        LOWEST DETECTABLE LIMIT FOR SERUM ALCOHOL IS 5 mg/dL FOR MEDICAL PURPOSES ONLY   Glucose, capillary     Status: None   Collection Time: 02/02/16 12:00 AM  Result Value Ref Range   Glucose-Capillary 69 65 - 99 mg/dL  Glucose, capillary     Status: Abnormal   Collection Time: 02/02/16 12:44 AM  Result Value Ref Range   Glucose-Capillary 59 (L) 65 - 99 mg/dL  Glucose, capillary     Status: None   Collection Time: 02/02/16  1:23 AM  Result Value Ref Range   Glucose-Capillary 97 65 - 99 mg/dL  Glucose, capillary     Status: Abnormal   Collection Time: 02/02/16  2:10 AM  Result Value Ref Range   Glucose-Capillary 168 (H) 65 - 99 mg/dL  Glucose, capillary     Status: Abnormal   Collection Time: 02/02/16  2:54 AM  Result Value Ref Range   Glucose-Capillary 141 (H) 65 - 99 mg/dL  MRSA PCR Screening     Status: None   Collection Time: 02/02/16  3:10 AM  Result Value Ref Range   MRSA by PCR NEGATIVE NEGATIVE    Comment:        The GeneXpert MRSA Assay (FDA approved for NASAL specimens only), is one component of a comprehensive MRSA colonization surveillance program. It is not intended to diagnose MRSA infection nor to guide or monitor treatment for MRSA infections.   Glucose, capillary     Status: None   Collection Time: 02/02/16  3:58 AM  Result Value Ref Range   Glucose-Capillary 93 65 - 99 mg/dL   Comment 1 Notify RN   CBC     Status: Abnormal   Collection Time: 02/02/16  4:12 AM  Result Value Ref Range   WBC 7.4 3.8 - 10.6 K/uL   RBC 3.87 (L) 4.40 - 5.90 MIL/uL   Hemoglobin 10.8 (L) 13.0 - 18.0 g/dL   HCT 33.5 (L) 40.0 - 52.0 %   MCV 86.5 80.0 - 100.0 fL   MCH 28.0 26.0 - 34.0 pg   MCHC 32.3 32.0 - 36.0 g/dL   RDW 16.6 (H) 11.5 - 14.5 %   Platelets 197 150 - 440 K/uL  Basic metabolic panel     Status: Abnormal   Collection Time: 02/02/16  4:12 AM  Result Value Ref  Range   Sodium 138 135 - 145 mmol/L   Potassium 3.8 3.5 - 5.1 mmol/L   Chloride 106 101 - 111 mmol/L   CO2 25 22 - 32 mmol/L   Glucose, Bld 63 (L) 65 - 99 mg/dL   BUN 32 (H) 6 - 20 mg/dL   Creatinine, Ser 1.42 (H) 0.61 - 1.24 mg/dL   Calcium 9.0 8.9 - 10.3 mg/dL   GFR calc non Af Amer 51 (L) >60 mL/min   GFR calc Af Amer 59 (L) >60 mL/min    Comment: (NOTE) The eGFR has been calculated using the CKD EPI equation. This calculation has not been validated in all clinical situations. eGFR's persistently <60 mL/min signify possible Chronic Kidney Disease.    Anion gap 7 5 - 15  Magnesium     Status: None   Collection Time: 02/02/16  4:12 AM  Result Value Ref Range   Magnesium  1.9 1.7 - 2.4 mg/dL  Phosphorus     Status: Abnormal   Collection Time: 02/02/16  4:12 AM  Result Value Ref Range   Phosphorus 2.3 (L) 2.5 - 4.6 mg/dL  Glucose, capillary     Status: Abnormal   Collection Time: 02/02/16  4:59 AM  Result Value Ref Range   Glucose-Capillary 44 (LL) 65 - 99 mg/dL   Comment 1 Notify RN   Glucose, capillary     Status: None   Collection Time: 02/02/16  5:18 AM  Result Value Ref Range   Glucose-Capillary 99 65 - 99 mg/dL   Comment 1 Notify RN   Glucose, capillary     Status: Abnormal   Collection Time: 02/02/16  5:58 AM  Result Value Ref Range   Glucose-Capillary 126 (H) 65 - 99 mg/dL   Comment 1 Notify RN   Glucose, capillary     Status: Abnormal   Collection Time: 02/02/16  6:54 AM  Result Value Ref Range   Glucose-Capillary 118 (H) 65 - 99 mg/dL   Comment 1 Notify RN   Urine Drug Screen, Qualitative ([facility] only)     Status: None   Collection Time: 02/02/16  7:04 AM  Result Value Ref Range   Tricyclic, Ur Screen NONE DETECTED NONE DETECTED   Amphetamines, Ur Screen NONE DETECTED NONE DETECTED   MDMA (Ecstasy)Ur Screen NONE DETECTED NONE DETECTED   Cocaine Metabolite,Ur [place] NONE DETECTED NONE DETECTED   Opiate, Ur Screen NONE DETECTED NONE DETECTED   Phencyclidine (PCP)  Ur S NONE DETECTED NONE DETECTED   Cannabinoid 50 Ng, Ur [place] NONE DETECTED NONE DETECTED   Barbiturates, Ur Screen NONE DETECTED NONE DETECTED   Benzodiazepine, Ur Scrn NONE DETECTED NONE DETECTED   Methadone Scn, Ur NONE DETECTED NONE DETECTED    Comment: (NOTE) 408  Tricyclics, urine               Cutoff 1000 ng/mL 200  Amphetamines, urine             Cutoff 1000 ng/mL 300  MDMA (Ecstasy), urine           Cutoff 500 ng/mL 400  Cocaine Metabolite, urine       Cutoff 300 ng/mL 500  Opiate, urine                   Cutoff 300 ng/mL 600  Phencyclidine (PCP), urine      Cutoff 25 ng/mL 700  Cannabinoid, urine              Cutoff 50 ng/mL 800  Barbiturates, urine             Cutoff 200 ng/mL 900  Benzodiazepine, urine           Cutoff 200 ng/mL 1000 Methadone, urine                Cutoff 300 ng/mL 1100 1200 The urine drug screen provides only a preliminary, unconfirmed 1300 analytical test result and should not be used for non-medical 1400 purposes. Clinical consideration and professional judgment should 1500 be applied to any positive drug screen result due to possible 1600 interfering substances. A more specific alternate chemical method 1700 must be used in order to obtain a confirmed analytical result.  1800 Gas chromato graphy / mass spectrometry (GC/MS) is the preferred 1900 confirmatory method.   Glucose, capillary     Status: Abnormal   Collection Time: 02/02/16  8:01 AM  Result Value Ref Range  Glucose-Capillary 64 (L) 65 - 99 mg/dL  Glucose, capillary     Status: Abnormal   Collection Time: 02/02/16  9:01 AM  Result Value Ref Range   Glucose-Capillary 101 (H) 65 - 99 mg/dL  Glucose, capillary     Status: Abnormal   Collection Time: 02/02/16 10:00 AM  Result Value Ref Range   Glucose-Capillary 182 (H) 65 - 99 mg/dL  Glucose, capillary     Status: Abnormal   Collection Time: 02/02/16 10:57 AM  Result Value Ref Range   Glucose-Capillary 191 (H) 65 - 99 mg/dL  Glucose,  capillary     Status: Abnormal   Collection Time: 02/02/16 12:04 PM  Result Value Ref Range   Glucose-Capillary 217 (H) 65 - 99 mg/dL  Basic metabolic panel     Status: Abnormal   Collection Time: 02/02/16 12:49 PM  Result Value Ref Range   Sodium 136 135 - 145 mmol/L   Potassium 4.0 3.5 - 5.1 mmol/L   Chloride 102 101 - 111 mmol/L   CO2 28 22 - 32 mmol/L   Glucose, Bld 203 (H) 65 - 99 mg/dL   BUN 27 (H) 6 - 20 mg/dL   Creatinine, Ser 1.24 0.61 - 1.24 mg/dL   Calcium 8.8 (L) 8.9 - 10.3 mg/dL   GFR calc non Af Amer >60 >60 mL/min   GFR calc Af Amer >60 >60 mL/min    Comment: (NOTE) The eGFR has been calculated using the CKD EPI equation. This calculation has not been validated in all clinical situations. eGFR's persistently <60 mL/min signify possible Chronic Kidney Disease.    Anion gap 6 5 - 15  Phosphorus     Status: None   Collection Time: 02/02/16 12:49 PM  Result Value Ref Range   Phosphorus 3.7 2.5 - 4.6 mg/dL  Valproic acid level     Status: Abnormal   Collection Time: 02/02/16 12:49 PM  Result Value Ref Range   Valproic Acid Lvl 25 (L) 50.0 - 100.0 ug/mL  Glucose, capillary     Status: Abnormal   Collection Time: 02/02/16  1:10 PM  Result Value Ref Range   Glucose-Capillary 228 (H) 65 - 99 mg/dL  Glucose, capillary     Status: Abnormal   Collection Time: 02/02/16  2:01 PM  Result Value Ref Range   Glucose-Capillary 214 (H) 65 - 99 mg/dL  Glucose, capillary     Status: Abnormal   Collection Time: 02/02/16  3:01 PM  Result Value Ref Range   Glucose-Capillary 227 (H) 65 - 99 mg/dL  Glucose, capillary     Status: Abnormal   Collection Time: 02/02/16  7:05 PM  Result Value Ref Range   Glucose-Capillary 107 (H) 65 - 99 mg/dL  Glucose, capillary     Status: Abnormal   Collection Time: 02/02/16 11:31 PM  Result Value Ref Range   Glucose-Capillary 144 (H) 65 - 99 mg/dL  Glucose, capillary     Status: Abnormal   Collection Time: 02/03/16  4:39 AM  Result Value Ref  Range   Glucose-Capillary 106 (H) 65 - 99 mg/dL  Basic metabolic panel     Status: Abnormal   Collection Time: 02/03/16  5:42 AM  Result Value Ref Range   Sodium 139 135 - 145 mmol/L   Potassium 4.3 3.5 - 5.1 mmol/L   Chloride 106 101 - 111 mmol/L   CO2 28 22 - 32 mmol/L   Glucose, Bld 107 (H) 65 - 99 mg/dL   BUN 25 (H) 6 -  20 mg/dL   Creatinine, Ser 1.32 (H) 0.61 - 1.24 mg/dL   Calcium 8.8 (L) 8.9 - 10.3 mg/dL   GFR calc non Af Amer 56 (L) >60 mL/min   GFR calc Af Amer >60 >60 mL/min    Comment: (NOTE) The eGFR has been calculated using the CKD EPI equation. This calculation has not been validated in all clinical situations. eGFR's persistently <60 mL/min signify possible Chronic Kidney Disease.    Anion gap 5 5 - 15  Glucose, capillary     Status: Abnormal   Collection Time: 02/03/16  8:10 AM  Result Value Ref Range   Glucose-Capillary 102 (H) 65 - 99 mg/dL  Glucose, capillary     Status: Abnormal   Collection Time: 02/03/16 11:46 AM  Result Value Ref Range   Glucose-Capillary 158 (H) 65 - 99 mg/dL  Glucose, capillary     Status: Abnormal   Collection Time: 02/03/16  4:07 PM  Result Value Ref Range   Glucose-Capillary 160 (H) 65 - 99 mg/dL    Current Facility-Administered Medications  Medication Dose Route Frequency Provider Last Rate Last Dose  . 0.9 %  sodium chloride infusion  250 mL Intravenous PRN Bincy S Varughese, NP      . allopurinol (ZYLOPRIM) tablet 100 mg  100 mg Oral Daily Bincy S Varughese, NP   100 mg at 02/03/16 1006  . amLODipine (NORVASC) tablet 5 mg  5 mg Oral Daily Holley Raring, NP   Stopped at 02/03/16 1000  . amoxicillin-clavulanate (AUGMENTIN) 875-125 MG per tablet 1 tablet  1 tablet Oral Q12H Vaughan Basta, MD      . aspirin EC tablet 81 mg  81 mg Oral Daily Bincy S Varughese, NP   81 mg at 02/03/16 1008  . clopidogrel (PLAVIX) tablet 75 mg  75 mg Oral Daily Bincy S Varughese, NP   75 mg at 02/03/16 0959  . dextrose 50 % solution 25  mL  25 mL Intravenous PRN Holley Raring, NP   25 mL at 02/02/16 0812  . divalproex (DEPAKOTE ER) 24 hr tablet 1,000 mg  1,000 mg Oral QHS Gonzella Lex, MD   1,000 mg at 02/02/16 2139  . divalproex (DEPAKOTE ER) 24 hr tablet 500 mg  500 mg Oral Daily Gonzella Lex, MD   500 mg at 02/03/16 1013  . DULoxetine (CYMBALTA) DR capsule 60 mg  60 mg Oral Daily Bincy S Varughese, NP   60 mg at 02/03/16 1007  . enoxaparin (LOVENOX) injection 40 mg  40 mg Subcutaneous Q24H Vishal Mungal, MD   40 mg at 02/02/16 2139  . famotidine (PEPCID) tablet 40 mg  40 mg Oral Daily Bincy S Varughese, NP   40 mg at 02/03/16 1002  . fenofibrate (TRICOR) tablet 145 mg  145 mg Oral Daily Bincy S Varughese, NP   145 mg at 02/03/16 1014  . glucagon (human recombinant) (GLUCAGEN) injection 1 mg  1 mg Intravenous Once PRN Bincy S Varughese, NP      . isosorbide mononitrate (IMDUR) 24 hr tablet 30 mg  30 mg Oral Daily Bincy S Varughese, NP   30 mg at 02/03/16 1131  . levothyroxine (SYNTHROID, LEVOTHROID) tablet 50 mcg  50 mcg Oral QAC breakfast Holley Raring, NP   50 mcg at 02/03/16 0956  . metoprolol succinate (TOPROL-XL) 24 hr tablet 50 mg  50 mg Oral Daily Vilinda Boehringer, MD   Stopped at 02/03/16 1000  . pregabalin (LYRICA) capsule 100 mg  100 mg Oral TID Holley Raring, NP   100 mg at 02/03/16 1004  . rOPINIRole (REQUIP) tablet 1 mg  1 mg Oral BID Bincy S Varughese, NP   1 mg at 02/03/16 1000  . rosuvastatin (CRESTOR) tablet 5 mg  5 mg Oral q1800 Bincy S Varughese, NP      . vitamin C (ASCORBIC ACID) tablet 500 mg  500 mg Oral Daily Bincy S Varughese, NP   500 mg at 02/03/16 1007    Musculoskeletal: Strength & Muscle Tone: within normal limits Gait & Station: normal Patient leans: N/A  Psychiatric Specialty Exam: Physical Exam  Nursing note and vitals reviewed. Constitutional: He appears well-developed and well-nourished.  HENT:  Head: Normocephalic and atraumatic.  Eyes: Conjunctivae are normal. Pupils  are equal, round, and reactive to light.  Neck: Normal range of motion.  Cardiovascular: Regular rhythm and normal heart sounds.   Respiratory: Effort normal. No respiratory distress.  GI: Soft.  Musculoskeletal: Normal range of motion.  Neurological: He is alert.  Skin: Skin is warm and dry.  Psychiatric: He has a normal mood and affect. His speech is normal and behavior is normal. Cognition and memory are normal. He expresses impulsivity. He expresses no suicidal ideation.    Review of Systems  Constitutional: Negative.   HENT: Negative.   Eyes: Negative.   Respiratory: Negative.   Cardiovascular: Negative.   Gastrointestinal: Negative.   Musculoskeletal: Positive for joint pain.  Skin: Negative.   Neurological: Negative.   Psychiatric/Behavioral: Positive for depression and suicidal ideas. Negative for hallucinations, memory loss and substance abuse. The patient is not nervous/anxious and does not have insomnia.     Blood pressure (!) 127/53, pulse (!) 53, temperature 98.5 F (36.9 C), temperature source Oral, resp. rate 12, height 6' 2"  (1.88 m), weight 127.6 kg (281 lb 3.2 oz), SpO2 98 %.Body mass index is 36.1 kg/m.  General Appearance: Casual  Eye Contact:  Good  Speech:  Clear and Coherent  Volume:  Normal  Mood:  Anxious and Euthymic  Affect:  Appropriate and Congruent  Thought Process:  Goal Directed  Orientation:  Full (Time, Place, and Person)  Thought Content:  Logical  Suicidal Thoughts:  Yes.  without intent/plan  Homicidal Thoughts:  No  Memory:  Immediate;   Good Recent;   Fair Remote;   Good  Judgement:  Fair  Insight:  Present  Psychomotor Activity:  Decreased  Concentration:  Concentration: Fair  Recall:  [employer] of Knowledge:  Fair  Language:  Fair  Akathisia:  No  Handed:  Right  AIMS (if indicated):     Assets:  Communication Skills Desire for Improvement Financial Resources/Insurance Housing Resilience  ADL's:  Intact  Cognition:  WNL   Sleep:        Treatment Plan Summary: Daily contact with patient to assess and evaluate symptoms and progress in treatment, Medication management and Plan Patient with bipolar disorder, depression, borderline personality disorder. Now back to his baseline. Patient is a chronic risk of this sort of impulsive behavior but is not in need of any acute changes to his medication. Spent time with him doing some supportive therapy and counseling. I am very glad that he is getting in to see a therapist as soon as he leaves the hospital. Continue current psychiatric medicine. I will check him tomorrow if he is still in the hospital. Patient will follow up with me as an outpatient for medication management.  Disposition: Patient does not meet  criteria for psychiatric inpatient admission. Supportive therapy provided about ongoing stressors.  Alethia Berthold, MD 02/03/2016 4:39 PM

## 2016-02-03 NOTE — Consult Note (Signed)
Reason for Consult: Abscess right midfoot Referring Physician: Hospitalist  Dennis Mullen is an 65 y.o. male.  HPI: Patient relates a history of some chronic recurrent callused area beneath his right foot. Recently developed some redness and swelling from underneath the area. Denies any injury or drainage. Was recently hospitalized with an insulin overdose and consult was put in for evaluation.  Past Medical History:  Diagnosis Date  . Abnormal levels of other serum enzymes   . Anxiety   . Bipolar disorder ([place])   . BPH (benign prostatic hyperplasia)   . CAD (coronary artery disease)   . CHF (congestive heart failure) ([place])    ?  Marland Kitchen Depression   . Diabetes mellitus, type II ([place])   . Diabetes mellitus, type II, insulin dependent ([place])   . ED (erectile dysfunction)   . GERD (gastroesophageal reflux disease)   . Gout   . History of borderline personality disorder   . Hypertension   . Hypogonadism in male   . Insomnia   . Mood disorder ([place])   . Neuropathy ([place])   . Obesity   . OSA (obstructive sleep apnea)   . PVD (peripheral vascular disease) ([place])   . Thyroid disease     Past Surgical History:  Procedure Laterality Date  . CARDIAC CATHETERIZATION N/A 10/12/2015   Procedure: Left Heart Cath and Coronary Angiography;  Surgeon: Teodoro Spray, MD;  Location: [facility];  Service: Cardiovascular;  Laterality: N/A;  . CORONARY ANGIOPLASTY WITH STENT PLACEMENT     x5 stents  . FOOT SURGERY    . MASTOIDECTOMY Right   . NASAL SEPTUM SURGERY    . penial inplant    . PENILE PROSTHESIS IMPLANT    . right mastoidectomy    . SCROTOPLASTY    . TONSILLECTOMY AND ADENOIDECTOMY      Family History  Problem Relation Age of Onset  . Lung cancer Mother   . Heart attack Father   . Post-traumatic stress disorder Father   . Anxiety disorder Father   . Depression Father   . Heart attack Sister   . Heart attack Brother     Social History:  reports that he has never  smoked. He has never used smokeless tobacco. He reports that he does not drink alcohol or use drugs.  Allergies: No Known Allergies  Medications:  Scheduled: . allopurinol  100 mg Oral Daily  . amLODipine  5 mg Oral Daily  . amoxicillin-clavulanate  1 tablet Oral Q12H  . aspirin EC  81 mg Oral Daily  . clopidogrel  75 mg Oral Daily  . divalproex  1,000 mg Oral QHS  . divalproex  500 mg Oral Daily  . DULoxetine  60 mg Oral Daily  . enoxaparin (LOVENOX) injection  40 mg Subcutaneous Q24H  . famotidine  40 mg Oral Daily  . fenofibrate  145 mg Oral Daily  . isosorbide mononitrate  30 mg Oral Daily  . levothyroxine  50 mcg Oral QAC breakfast  . metoprolol succinate  50 mg Oral Daily  . pregabalin  100 mg Oral TID  . rOPINIRole  1 mg Oral BID  . rosuvastatin  5 mg Oral q1800  . vitamin C  500 mg Oral Daily    Results for orders placed or performed during the hospital encounter of 02/01/16 (from the past 48 hour(s))  CBC with Differential     Status: Abnormal   Collection Time: 02/01/16 11:59 PM  Result Value Ref Range  WBC 8.3 3.8 - 10.6 K/uL   RBC 4.11 (L) 4.40 - 5.90 MIL/uL   Hemoglobin 11.5 (L) 13.0 - 18.0 g/dL   HCT 35.5 (L) 40.0 - 52.0 %   MCV 86.5 80.0 - 100.0 fL   MCH 27.9 26.0 - 34.0 pg   MCHC 32.3 32.0 - 36.0 g/dL   RDW 16.6 (H) 11.5 - 14.5 %   Platelets 213 150 - 440 K/uL   Neutrophils Relative % 71 %   Neutro Abs 5.8 1.4 - 6.5 K/uL   Lymphocytes Relative 20 %   Lymphs Abs 1.7 1.0 - 3.6 K/uL   Monocytes Relative 8 %   Monocytes Absolute 0.7 0.2 - 1.0 K/uL   Eosinophils Relative 1 %   Eosinophils Absolute 0.1 0 - 0.7 K/uL   Basophils Relative 0 %   Basophils Absolute 0.0 0 - 0.1 K/uL  Comprehensive metabolic panel     Status: Abnormal   Collection Time: 02/01/16 11:59 PM  Result Value Ref Range   Sodium 141 135 - 145 mmol/L   Potassium 3.6 3.5 - 5.1 mmol/L   Chloride 105 101 - 111 mmol/L   CO2 28 22 - 32 mmol/L   Glucose, Bld 51 (L) 65 - 99 mg/dL   BUN 31  (H) 6 - 20 mg/dL   Creatinine, Ser 1.57 (H) 0.61 - 1.24 mg/dL   Calcium 9.3 8.9 - 10.3 mg/dL   Total Protein 7.7 6.5 - 8.1 g/dL   Albumin 3.8 3.5 - 5.0 g/dL   AST 34 15 - 41 U/L   ALT 21 17 - 63 U/L   Alkaline Phosphatase 54 38 - 126 U/L   Total Bilirubin 0.6 0.3 - 1.2 mg/dL   GFR calc non Af Amer 45 (L) >60 mL/min   GFR calc Af Amer 52 (L) >60 mL/min    Comment: (NOTE) The eGFR has been calculated using the CKD EPI equation. This calculation has not been validated in all clinical situations. eGFR's persistently <60 mL/min signify possible Chronic Kidney Disease.    Anion gap 8 5 - 15  Acetaminophen level     Status: Abnormal   Collection Time: 02/01/16 11:59 PM  Result Value Ref Range   Acetaminophen (Tylenol), Serum <10 (L) 10 - 30 ug/mL    Comment:        THERAPEUTIC CONCENTRATIONS VARY SIGNIFICANTLY. A RANGE OF 10-30 ug/mL MAY BE AN EFFECTIVE CONCENTRATION FOR MANY PATIENTS. HOWEVER, SOME ARE BEST TREATED AT CONCENTRATIONS OUTSIDE THIS RANGE. ACETAMINOPHEN CONCENTRATIONS >150 ug/mL AT 4 HOURS AFTER INGESTION AND >50 ug/mL AT 12 HOURS AFTER INGESTION ARE OFTEN ASSOCIATED WITH TOXIC REACTIONS.   Salicylate level     Status: None   Collection Time: 02/01/16 11:59 PM  Result Value Ref Range   Salicylate Lvl <0.1 2.8 - 30.0 mg/dL  Ethanol     Status: None   Collection Time: 02/01/16 11:59 PM  Result Value Ref Range   Alcohol, Ethyl (B) <5 <5 mg/dL    Comment:        LOWEST DETECTABLE LIMIT FOR SERUM ALCOHOL IS 5 mg/dL FOR MEDICAL PURPOSES ONLY   Glucose, capillary     Status: None   Collection Time: 02/02/16 12:00 AM  Result Value Ref Range   Glucose-Capillary 69 65 - 99 mg/dL  Glucose, capillary     Status: Abnormal   Collection Time: 02/02/16 12:44 AM  Result Value Ref Range   Glucose-Capillary 59 (L) 65 - 99 mg/dL  Glucose, capillary  Status: None   Collection Time: 02/02/16  1:23 AM  Result Value Ref Range   Glucose-Capillary 97 65 - 99 mg/dL   Glucose, capillary     Status: Abnormal   Collection Time: 02/02/16  2:10 AM  Result Value Ref Range   Glucose-Capillary 168 (H) 65 - 99 mg/dL  Glucose, capillary     Status: Abnormal   Collection Time: 02/02/16  2:54 AM  Result Value Ref Range   Glucose-Capillary 141 (H) 65 - 99 mg/dL  MRSA PCR Screening     Status: None   Collection Time: 02/02/16  3:10 AM  Result Value Ref Range   MRSA by PCR NEGATIVE NEGATIVE    Comment:        The GeneXpert MRSA Assay (FDA approved for NASAL specimens only), is one component of a comprehensive MRSA colonization surveillance program. It is not intended to diagnose MRSA infection nor to guide or monitor treatment for MRSA infections.   Glucose, capillary     Status: None   Collection Time: 02/02/16  3:58 AM  Result Value Ref Range   Glucose-Capillary 93 65 - 99 mg/dL   Comment 1 Notify RN   CBC     Status: Abnormal   Collection Time: 02/02/16  4:12 AM  Result Value Ref Range   WBC 7.4 3.8 - 10.6 K/uL   RBC 3.87 (L) 4.40 - 5.90 MIL/uL   Hemoglobin 10.8 (L) 13.0 - 18.0 g/dL   HCT 33.5 (L) 40.0 - 52.0 %   MCV 86.5 80.0 - 100.0 fL   MCH 28.0 26.0 - 34.0 pg   MCHC 32.3 32.0 - 36.0 g/dL   RDW 16.6 (H) 11.5 - 14.5 %   Platelets 197 150 - 440 K/uL  Basic metabolic panel     Status: Abnormal   Collection Time: 02/02/16  4:12 AM  Result Value Ref Range   Sodium 138 135 - 145 mmol/L   Potassium 3.8 3.5 - 5.1 mmol/L   Chloride 106 101 - 111 mmol/L   CO2 25 22 - 32 mmol/L   Glucose, Bld 63 (L) 65 - 99 mg/dL   BUN 32 (H) 6 - 20 mg/dL   Creatinine, Ser 1.42 (H) 0.61 - 1.24 mg/dL   Calcium 9.0 8.9 - 10.3 mg/dL   GFR calc non Af Amer 51 (L) >60 mL/min   GFR calc Af Amer 59 (L) >60 mL/min    Comment: (NOTE) The eGFR has been calculated using the CKD EPI equation. This calculation has not been validated in all clinical situations. eGFR's persistently <60 mL/min signify possible Chronic Kidney Disease.    Anion gap 7 5 - 15  Magnesium      Status: None   Collection Time: 02/02/16  4:12 AM  Result Value Ref Range   Magnesium 1.9 1.7 - 2.4 mg/dL  Phosphorus     Status: Abnormal   Collection Time: 02/02/16  4:12 AM  Result Value Ref Range   Phosphorus 2.3 (L) 2.5 - 4.6 mg/dL  Glucose, capillary     Status: Abnormal   Collection Time: 02/02/16  4:59 AM  Result Value Ref Range   Glucose-Capillary 44 (LL) 65 - 99 mg/dL   Comment 1 Notify RN   Glucose, capillary     Status: None   Collection Time: 02/02/16  5:18 AM  Result Value Ref Range   Glucose-Capillary 99 65 - 99 mg/dL   Comment 1 Notify RN   Glucose, capillary     Status: Abnormal  Collection Time: 02/02/16  5:58 AM  Result Value Ref Range   Glucose-Capillary 126 (H) 65 - 99 mg/dL   Comment 1 Notify RN   Glucose, capillary     Status: Abnormal   Collection Time: 02/02/16  6:54 AM  Result Value Ref Range   Glucose-Capillary 118 (H) 65 - 99 mg/dL   Comment 1 Notify RN   Urine Drug Screen, Qualitative ([facility] only)     Status: None   Collection Time: 02/02/16  7:04 AM  Result Value Ref Range   Tricyclic, Ur Screen NONE DETECTED NONE DETECTED   Amphetamines, Ur Screen NONE DETECTED NONE DETECTED   MDMA (Ecstasy)Ur Screen NONE DETECTED NONE DETECTED   Cocaine Metabolite,Ur [place] NONE DETECTED NONE DETECTED   Opiate, Ur Screen NONE DETECTED NONE DETECTED   Phencyclidine (PCP) Ur S NONE DETECTED NONE DETECTED   Cannabinoid 50 Ng, Ur [place] NONE DETECTED NONE DETECTED   Barbiturates, Ur Screen NONE DETECTED NONE DETECTED   Benzodiazepine, Ur Scrn NONE DETECTED NONE DETECTED   Methadone Scn, Ur NONE DETECTED NONE DETECTED    Comment: (NOTE) 009  Tricyclics, urine               Cutoff 1000 ng/mL 200  Amphetamines, urine             Cutoff 1000 ng/mL 300  MDMA (Ecstasy), urine           Cutoff 500 ng/mL 400  Cocaine Metabolite, urine       Cutoff 300 ng/mL 500  Opiate, urine                   Cutoff 300 ng/mL 600  Phencyclidine (PCP), urine      Cutoff 25 ng/mL 700   Cannabinoid, urine              Cutoff 50 ng/mL 800  Barbiturates, urine             Cutoff 200 ng/mL 900  Benzodiazepine, urine           Cutoff 200 ng/mL 1000 Methadone, urine                Cutoff 300 ng/mL 1100 1200 The urine drug screen provides only a preliminary, unconfirmed 1300 analytical test result and should not be used for non-medical 1400 purposes. Clinical consideration and professional judgment should 1500 be applied to any positive drug screen result due to possible 1600 interfering substances. A more specific alternate chemical method 1700 must be used in order to obtain a confirmed analytical result.  1800 Gas chromato graphy / mass spectrometry (GC/MS) is the preferred 1900 confirmatory method.   Glucose, capillary     Status: Abnormal   Collection Time: 02/02/16  8:01 AM  Result Value Ref Range   Glucose-Capillary 64 (L) 65 - 99 mg/dL  Glucose, capillary     Status: Abnormal   Collection Time: 02/02/16  9:01 AM  Result Value Ref Range   Glucose-Capillary 101 (H) 65 - 99 mg/dL  Glucose, capillary     Status: Abnormal   Collection Time: 02/02/16 10:00 AM  Result Value Ref Range   Glucose-Capillary 182 (H) 65 - 99 mg/dL  Glucose, capillary     Status: Abnormal   Collection Time: 02/02/16 10:57 AM  Result Value Ref Range   Glucose-Capillary 191 (H) 65 - 99 mg/dL  Glucose, capillary     Status: Abnormal   Collection Time: 02/02/16 12:04 PM  Result Value Ref Range  Glucose-Capillary 217 (H) 65 - 99 mg/dL  Basic metabolic panel     Status: Abnormal   Collection Time: 02/02/16 12:49 PM  Result Value Ref Range   Sodium 136 135 - 145 mmol/L   Potassium 4.0 3.5 - 5.1 mmol/L   Chloride 102 101 - 111 mmol/L   CO2 28 22 - 32 mmol/L   Glucose, Bld 203 (H) 65 - 99 mg/dL   BUN 27 (H) 6 - 20 mg/dL   Creatinine, Ser 1.24 0.61 - 1.24 mg/dL   Calcium 8.8 (L) 8.9 - 10.3 mg/dL   GFR calc non Af Amer >60 >60 mL/min   GFR calc Af Amer >60 >60 mL/min    Comment:  (NOTE) The eGFR has been calculated using the CKD EPI equation. This calculation has not been validated in all clinical situations. eGFR's persistently <60 mL/min signify possible Chronic Kidney Disease.    Anion gap 6 5 - 15  Phosphorus     Status: None   Collection Time: 02/02/16 12:49 PM  Result Value Ref Range   Phosphorus 3.7 2.5 - 4.6 mg/dL  Valproic acid level     Status: Abnormal   Collection Time: 02/02/16 12:49 PM  Result Value Ref Range   Valproic Acid Lvl 25 (L) 50.0 - 100.0 ug/mL  Glucose, capillary     Status: Abnormal   Collection Time: 02/02/16  1:10 PM  Result Value Ref Range   Glucose-Capillary 228 (H) 65 - 99 mg/dL  Glucose, capillary     Status: Abnormal   Collection Time: 02/02/16  2:01 PM  Result Value Ref Range   Glucose-Capillary 214 (H) 65 - 99 mg/dL  Glucose, capillary     Status: Abnormal   Collection Time: 02/02/16  3:01 PM  Result Value Ref Range   Glucose-Capillary 227 (H) 65 - 99 mg/dL  Glucose, capillary     Status: Abnormal   Collection Time: 02/02/16  7:05 PM  Result Value Ref Range   Glucose-Capillary 107 (H) 65 - 99 mg/dL  Glucose, capillary     Status: Abnormal   Collection Time: 02/02/16 11:31 PM  Result Value Ref Range   Glucose-Capillary 144 (H) 65 - 99 mg/dL  Glucose, capillary     Status: Abnormal   Collection Time: 02/03/16  4:39 AM  Result Value Ref Range   Glucose-Capillary 106 (H) 65 - 99 mg/dL  Basic metabolic panel     Status: Abnormal   Collection Time: 02/03/16  5:42 AM  Result Value Ref Range   Sodium 139 135 - 145 mmol/L   Potassium 4.3 3.5 - 5.1 mmol/L   Chloride 106 101 - 111 mmol/L   CO2 28 22 - 32 mmol/L   Glucose, Bld 107 (H) 65 - 99 mg/dL   BUN 25 (H) 6 - 20 mg/dL   Creatinine, Ser 1.32 (H) 0.61 - 1.24 mg/dL   Calcium 8.8 (L) 8.9 - 10.3 mg/dL   GFR calc non Af Amer 56 (L) >60 mL/min   GFR calc Af Amer >60 >60 mL/min    Comment: (NOTE) The eGFR has been calculated using the CKD EPI equation. This  calculation has not been validated in all clinical situations. eGFR's persistently <60 mL/min signify possible Chronic Kidney Disease.    Anion gap 5 5 - 15  Glucose, capillary     Status: Abnormal   Collection Time: 02/03/16  8:10 AM  Result Value Ref Range   Glucose-Capillary 102 (H) 65 - 99 mg/dL  Glucose, capillary  Status: Abnormal   Collection Time: 02/03/16 11:46 AM  Result Value Ref Range   Glucose-Capillary 158 (H) 65 - 99 mg/dL  Glucose, capillary     Status: Abnormal   Collection Time: 02/03/16  4:07 PM  Result Value Ref Range   Glucose-Capillary 160 (H) 65 - 99 mg/dL    No results found.  Review of Systems  Constitutional: Negative for chills and fever.  HENT: Negative.   Eyes: Negative.   Respiratory: Negative.   Cardiovascular: Negative.   Gastrointestinal: Negative for nausea and vomiting.  Genitourinary: Negative.   Musculoskeletal:       Does relate some pain from beneath the midfoot ulcerative area.  Skin:       Relate some redness with an abscess on the bottom of his right midfoot. Denies any bleeding from this area  Neurological:       Relates some neuropathy changes from his diabetes. States he has about 30% feeling  Endo/Heme/Allergies: Negative.   Psychiatric/Behavioral: Negative.    Blood pressure (!) 127/53, pulse (!) 53, temperature 98.5 F (36.9 C), temperature source Oral, resp. rate 12, height _0  (1.88 m), weight 127.6 kg (281 lb 3.2 oz), SpO2 98 %. Physical Exam  Cardiovascular:  DP and PT pulses are fully palpable bilateral. Capillary filling time intact  Musculoskeletal:  Adequate range of motion of the pedal joints. There is some pain on palpation around the fifth metatarsal base and abscessed area. Muscle testing is deferred  Neurological:  Loss of protective threshold with a monofilament wire distally in the toe areas.  Skin:  The skin is warm dry and somewhat atrophic. A large abscessed area on the plantar lateral aspect of the  right midfoot at the fifth metatarsal base region. Some surrounding erythema along the dorsal aspect. No active drainage. Upon debridement there is an underlying ulceration measuring proximally 2 cm diameter. No deep extension through the superficial tissues.    Assessment/Plan: Assessment: 1. Superficial ulceration with abscess right midfoot. 2. Diabetes with associated neuropathy.  Lab: Debrided devitalized tissue from the ulcerative area and abscess on the right midfoot. A culture was taken for sensitivities. A wet-to-dry sterile saline gauze dressing was applied. Patient states that he is trying to go home today if the foot is stable. Augmentin has already been ordered. Patient should be stable for discharge today on Augmentin. We discussed daily wound care with antibiotic ointment and a gauze bandage. Patient understands to call the office for follow-up in approximately 10 days.  Durward Fortes 02/03/2016, 5:26 PM

## 2016-02-03 NOTE — Discharge Summary (Signed)
[facility] NAME: Dennis Mullen    MR#:  [id number]  DATE OF BIRTH:  [birth year]  DATE OF ADMISSION:  02/01/2016 ADMITTING PHYSICIAN: Vilinda Boehringer, MD  DATE OF DISCHARGE: 02/03/2016  PRIMARY CARE PHYSICIAN: Lavera Guise, MD    ADMISSION DIAGNOSIS:  Hypoglycemia [E16.2] Insulin overdose, intentional self-harm, initial encounter ([place]) [T38.3X2A]  DISCHARGE DIAGNOSIS:  Principal Problem:   Bipolar 2 disorder ([place]) Active Problems:   Borderline personality disorder   Insulin overdose   Hypoglycemia   OSA on CPAP   Depression   Hypophosphatemia   Cellulitis on foot.  SECONDARY DIAGNOSIS:   Past Medical History:  Diagnosis Date  . Abnormal levels of other serum enzymes   . Anxiety   . Bipolar disorder ([place])   . BPH (benign prostatic hyperplasia)   . CAD (coronary artery disease)   . CHF (congestive heart failure) ([place])    ?  Marland Kitchen Depression   . Diabetes mellitus, type II ([place])   . Diabetes mellitus, type II, insulin dependent ([place])   . ED (erectile dysfunction)   . GERD (gastroesophageal reflux disease)   . Gout   . History of borderline personality disorder   . Hypertension   . Hypogonadism in male   . Insomnia   . Mood disorder ([place])   . Neuropathy ([place])   . Obesity   . OSA (obstructive sleep apnea)   . PVD (peripheral vascular disease) ([place])   . Thyroid disease     HOSPITAL COURSE:   Patient came with overdose of insulin and hypoglycemia, initially monitored in stepdown unit with dextrose IV drip and frequent blood sugar management- remained stable and transferred to medical services. He was also seen by psychiatric team and suggested to continue on the same home regimen off his psychiatric medications and advised to follow in the office but no inpatient admission.  Patient also had some complaint of redness and tenderness on the lateral side of his right foot which looked having cellulitis so  podiatry consult was called in as he was following with them in the office. Suggested to discharge him on oral Augmentin. Other medical issues remained stable during the hospital stay.  DISCHARGE CONDITIONS:   Stable.  CONSULTS OBTAINED:  Treatment Team:  Gonzella Lex, MD Sharlotte Alamo, DPM  DRUG ALLERGIES:  No Known Allergies  DISCHARGE MEDICATIONS:   Current Discharge Medication List    START taking these medications   Details  amoxicillin-clavulanate (AUGMENTIN) 875-125 MG tablet Take 1 tablet by mouth every 12 (twelve) hours. Qty: 14 tablet, Refills: 0      CONTINUE these medications which have NOT CHANGED   Details  allopurinol (ZYLOPRIM) 100 MG tablet Take 100 mg by mouth every morning.    amLODipine (NORVASC) 5 MG tablet Take 5 mg by mouth daily.     aspirin EC 81 MG tablet Take 81 mg by mouth every morning. Reported on 07/26/2015    clopidogrel (PLAVIX) 75 MG tablet Take 75 mg by mouth every morning. Reported on 07/26/2015    colchicine 0.6 MG tablet     diclofenac (VOLTAREN) 75 MG EC tablet     divalproex (DEPAKOTE ER) 500 MG 24 hr tablet Take 1 tablet (500 mg total) by mouth 3 (three) times daily. Take 543m in morning and 10062min evening Qty: 270 tablet, Refills: 1    DULoxetine (CYMBALTA) 60 MG capsule Take 1 capsule (60 mg total) by mouth daily. Qty: 90 capsule,  Refills: 1    esomeprazole (NEXIUM) 40 MG capsule Take 40 mg by mouth daily.     fenofibrate (TRICOR) 145 MG tablet Take by mouth.    furosemide (LASIX) 40 MG tablet Take 1 tablet (40 mg total) by mouth daily. Qty: 30 tablet, Refills: 0    glucose 4 GM chewable tablet Chew 1 tablet by mouth as needed for low blood sugar.    insulin aspart (NOVOLOG) 100 UNIT/ML injection Inject 12-20 Units into the skin 3 (three) times daily with meals. Reported on 10/11/2015    Insulin Disposable Pump (V-GO 30) KIT Reported on 06/29/2015    isosorbide mononitrate (IMDUR) 30 MG 24 hr tablet Take 1 tablet (30  mg total) by mouth daily. Qty: 30 tablet, Refills: 0    levothyroxine (SYNTHROID, LEVOTHROID) 50 MCG tablet Take 50 mcg by mouth daily before breakfast.    metFORMIN (GLUCOPHAGE) 1000 MG tablet Take 1,000 mg by mouth 2 (two) times daily with a meal.    metoprolol succinate (TOPROL-XL) 50 MG 24 hr tablet Take 50 mg by mouth daily.     nitroGLYCERIN (NITROSTAT) 0.4 MG SL tablet Place 0.4 mg under the tongue every 5 (five) minutes x 3 doses as needed for chest pain.     pioglitazone (ACTOS) 15 MG tablet Take 15 mg by mouth every morning.     pregabalin (LYRICA) 100 MG capsule Take 100 mg by mouth 3 (three) times daily.    rOPINIRole (REQUIP) 1 MG tablet Take 1 mg by mouth 2 (two) times daily.     rosuvastatin (CRESTOR) 5 MG tablet Take 5 mg by mouth daily at 6 PM.     VICTOZA 18 MG/3ML SOPN Inject 1.8 mg into the skin daily.     vitamin C (ASCORBIC ACID) 500 MG tablet Take 500 mg by mouth daily.         DISCHARGE INSTRUCTIONS:    Stable.  If you experience worsening of your admission symptoms, develop shortness of breath, life threatening emergency, suicidal or homicidal thoughts you must seek medical attention immediately by calling 911 or calling your MD immediately  if symptoms less severe.  You Must read complete instructions/literature along with all the possible adverse reactions/side effects for all the Medicines you take and that have been prescribed to you. Take any new Medicines after you have completely understood and accept all the possible adverse reactions/side effects.   Please note  You were cared for by a hospitalist during your hospital stay. If you have any questions about your discharge medications or the care you received while you were in the hospital after you are discharged, you can call the unit and asked to speak with the hospitalist on call if the hospitalist that took care of you is not available. Once you are discharged, your primary care physician will  handle any further medical issues. Please note that NO REFILLS for any discharge medications will be authorized once you are discharged, as it is imperative that you return to your primary care physician (or establish a relationship with a primary care physician if you do not have one) for your aftercare needs so that they can reassess your need for medications and monitor your lab values.    Today   CHIEF COMPLAINT:  No chief complaint on file.   HISTORY OF PRESENT ILLNESS:  Dennis Mullen  is a 65 y.o. male with a known history of severe CAD, CHF, depression, diabetes mellitus type 2, GERD, history of borderline personality disorder,  hypertension, gout, obstructive sleep apnea and hypothyroidism. Patient states that "he has been suffering with multiple medical problems and is tired of it. He also states that he is severely depressed for not being able to see his grandkids". Therefore patient injected 300 units of  Humalog insulin to hurt himself. EMS was called and the blood sugar was noted to be 60 and thereafter down to 40 for which he received 1 amp of D50. In the ED patient was started on 10% dextrose and patient was getting his blood sugar monitored frequently. [facility] M team was called to admit the patient to ICU. PAST MEDICAL HISTORY :  He  has a past medical history of Abnormal levels of other serum enzymes; Anxiety; Bipolar disorder (HCC); BPH (benign prostatic hyperplasia); CAD (coronary artery disease); CHF (congestive heart failure) ([place]); Depression; Diabetes mellitus, type II ([place]); Diabetes mellitus, type II, insulin dependent ([place]); ED (erectile dysfunction); GERD (gastroesophageal reflux disease); Gout; History of borderline personality disorder; Hypertension; Hypogonadism in male; Insomnia; Mood disorder ([place]); Neuropathy ([place]); Obesity; OSA (obstructive sleep apnea); PVD (peripheral vascular disease) ([place]); and Thyroid disease.   VITAL SIGNS:  Blood pressure (!) 127/53, pulse (!)  53, temperature 98.5 F (36.9 C), temperature source Oral, resp. rate 12, height _0  (1.88 m), weight 127.6 kg (281 lb 3.2 oz), SpO2 98 %.  I/O:   Intake/Output Summary (Last 24 hours) at 02/03/16 1729 Last data filed at 02/03/16 1300  Gross per 24 hour  Intake             1200 ml  Output             2425 ml  Net            -1225 ml    PHYSICAL EXAMINATION:  GENERAL:  65 y.o.-year-old patient lying in the bed with no acute distress.  EYES: Pupils equal, round, reactive to light and accommodation. No scleral icterus. Extraocular muscles intact.  HEENT: Head atraumatic, normocephalic. Oropharynx and nasopharynx clear.  NECK:  Supple, no jugular venous distention. No thyroid enlargement, no tenderness.  LUNGS: Normal breath sounds bilaterally, no wheezing, rales,rhonchi or crepitation. No use of accessory muscles of respiration.  CARDIOVASCULAR: S1, S2 normal. No murmurs, rubs, or gallops.  ABDOMEN: Soft, non-tender, non-distended. Bowel sounds present. No organomegaly or mass.  EXTREMITIES: No pedal edema, cyanosis, or clubbing. I aspect of her right foot have hyperkeratosis with some redness and tenderness around the region. No palpable or drainable fluid underneath. NEUROLOGIC: Cranial nerves II through XII are intact. Muscle strength 5/5 in all extremities. Sensation intact. Gait not checked.  PSYCHIATRIC: The patient is alert and oriented x 3.  SKIN: No obvious rash, lesion, or ulcer.   DATA REVIEW:   CBC  Recent Labs Lab 02/02/16 0412  WBC 7.4  HGB 10.8*  HCT 33.5*  PLT 197    Chemistries   Recent Labs Lab 02/01/16 2359 02/02/16 0412  02/03/16 0542  NA 141 138  < > 139  K 3.6 3.8  < > 4.3  CL 105 106  < > 106  CO2 28 25  < > 28  GLUCOSE 51* 63*  < > 107*  BUN 31* 32*  < > 25*  CREATININE 1.57* 1.42*  < > 1.32*  CALCIUM 9.3 9.0  < > 8.8*  MG  --  1.9  --   --   AST 34  --   --   --   ALT 21  --   --   --  ALKPHOS 54  --   --   --   BILITOT 0.6  --   --    --   < > = values in this interval not displayed.  Cardiac Enzymes No results for input(s): TROPONINI in the last 168 hours.  Microbiology Results  Results for orders placed or performed during the hospital encounter of 02/01/16  MRSA PCR Screening     Status: None   Collection Time: 02/02/16  3:10 AM  Result Value Ref Range Status   MRSA by PCR NEGATIVE NEGATIVE Final    Comment:        The GeneXpert MRSA Assay (FDA approved for NASAL specimens only), is one component of a comprehensive MRSA colonization surveillance program. It is not intended to diagnose MRSA infection nor to guide or monitor treatment for MRSA infections.     RADIOLOGY:  No results found.  EKG:   Orders placed or performed during the hospital encounter of 12/20/15  . EKG 12-Lead  . EKG 12-Lead  . EKG 12-Lead  . EKG 12-Lead  . ED EKG  . ED EKG  . EKG      Management plans discussed with the patient, family and they are in agreement.  CODE STATUS: Full code    Code Status Orders        Start     Ordered   02/02/16 0135  Full code  Continuous     02/02/16 0137    Code Status History    Date Active Date Inactive Code Status Order ID Comments User Context   10/11/2015 12:44 PM 10/12/2015  5:30 PM Full Code [id number]  Vaughan Basta, MD Inpatient   09/24/2015  3:12 AM 09/25/2015  3:06 PM Full Code [id number]  Saundra Shelling, MD Inpatient   11/14/2014  5:07 AM 11/15/2014  4:53 PM Full Code [id number]  Harrie Foreman, MD ED    Advance Directive Documentation   Flowsheet Row Most Recent Value  Type of Advance Directive  Healthcare Power of Attorney  Pre-existing out of facility DNR order (yellow form or pink MOST form)  No data  "MOST" Form in Place?  No data      TOTAL TIME TAKING CARE OF THIS PATIENT: 35 minutes.    Vaughan Basta M.D on 02/03/2016 at 5:29 PM  Between 7am to 6pm - Pager - [phone]  After 6pm go to [email or web address] - password [email or web address] [place]  [facility]  Office  [phone]  CC: Primary care physician; Lavera Guise, MD   Note: This dictation was prepared with Dragon dictation along with smaller phrase technology. Any transcriptional errors that result from this process are unintentional.

## 2016-02-03 NOTE — Progress Notes (Addendum)
Inpatient Diabetes Program Recommendations  AACE/ADA: New Consensus Statement on Inpatient Glycemic Control (2015)  Target Ranges:  Prepandial:   less than 140 mg/dL      Peak postprandial:   less than 180 mg/dL (1-2 hours)      Critically ill patients:  140 - 180 mg/dL   Lab Results  Component Value Date   GLUCAP 102 (H) 02/03/2016   HGBA1C 6.4 (H) 09/24/2015    Review of Glycemic Control   Results for LOID, ZOLNOWSKI (MRN [id number]) as of 02/03/2016 10:00  Ref. Range 02/02/2016 15:01 02/02/2016 19:05 02/02/2016 23:31 02/03/2016 04:39 02/03/2016 08:10  Glucose-Capillary Latest Ref Range: 65 - 99 mg/dL 227 (H) 107 (H) 144 (H) 106 (H) 102 (H)    Diabetes history: DM2  Outpatient Diabetes medications: V-Go 20 with Humalog (provides 20 units of basal insulin over 24 hours; uses 2-6 clicks with meals in which each click is 2 units of insulin), Victoza 1.2 mg daily, Metformin 1000 mg BID, Actos 15 mg daily  Current orders for Inpatient glycemic control: none, CBG q4h  Inpatient Diabetes Program Recommendations:  Insulin - Basal: Once CBGs are consistently greater than 180 mg/dl (without any hypoglycemia treatment), may want to consider ordering low dose basal insulin. If so, would recommend starting with Lantus 13 units Q24H (based on 129 kg x 0.1 units).  Correction (SSI): Once glucose is consistently greater than 140 mg/dl, please consider ordering CBGs with Novolog moderate correction scale Q4H.  Consider changing diet to heart healthy/carb modified- this will still give him plenty of food/carbohydrates but will keep concentrated sugars to a minimum.   Gentry Fitz, RN, BA, MHA, CDE Diabetes Coordinator Inpatient Diabetes Program  [phone] (Team Pager) [phone] ([place]) 02/03/2016 10:03 AM

## 2016-02-03 NOTE — Progress Notes (Signed)
02/03/2016 6:36 PM  BP (!) 127/53 (BP Location: Left Arm)   Pulse (!) 53   Temp 98.5 F (36.9 C) (Oral)   Resp 12   Ht 6\' 2"  (1.88 m)   Wt 127.6 kg (281 lb 3.2 oz)   SpO2 98%   BMI 36.10 kg/m  Patient discharged per MD orders. Discharge instructions reviewed with patient and patient verbalized understanding. IV's removed per policy. Prescriptions discussed and given to patient. Awaiting ride to arrive for pickup.  Almedia Balls, RN

## 2016-02-09 LAB — AEROBIC/ANAEROBIC CULTURE (SURGICAL/DEEP WOUND)

## 2016-02-09 LAB — AEROBIC/ANAEROBIC CULTURE W GRAM STAIN (SURGICAL/DEEP WOUND)

## 2016-02-11 ENCOUNTER — Encounter: Payer: Self-pay | Admitting: *Deleted

## 2016-02-11 ENCOUNTER — Telehealth: Payer: Self-pay | Admitting: *Deleted

## 2016-02-11 DIAGNOSIS — I214 Non-ST elevation (NSTEMI) myocardial infarction: Secondary | ICD-10-CM

## 2016-02-11 NOTE — Telephone Encounter (Signed)
[name] called to let us know that he was in the hospital and diagnosed with cellulitis.  He was put on Augmentin and encouraged to get physician okay before returning to rehab.

## 2016-02-15 DIAGNOSIS — Z23 Encounter for immunization: Secondary | ICD-10-CM | POA: Diagnosis not present

## 2016-02-16 ENCOUNTER — Encounter: Payer: Medicare Other | Attending: Internal Medicine

## 2016-02-16 DIAGNOSIS — Z955 Presence of coronary angioplasty implant and graft: Secondary | ICD-10-CM | POA: Insufficient documentation

## 2016-02-17 ENCOUNTER — Encounter: Payer: Self-pay | Admitting: *Deleted

## 2016-02-17 DIAGNOSIS — Z794 Long term (current) use of insulin: Secondary | ICD-10-CM | POA: Diagnosis not present

## 2016-02-17 DIAGNOSIS — L97511 Non-pressure chronic ulcer of other part of right foot limited to breakdown of skin: Secondary | ICD-10-CM | POA: Diagnosis not present

## 2016-02-17 DIAGNOSIS — I214 Non-ST elevation (NSTEMI) myocardial infarction: Secondary | ICD-10-CM

## 2016-02-17 DIAGNOSIS — E1142 Type 2 diabetes mellitus with diabetic polyneuropathy: Secondary | ICD-10-CM | POA: Diagnosis not present

## 2016-02-21 ENCOUNTER — Encounter: Payer: Self-pay | Admitting: *Deleted

## 2016-02-21 ENCOUNTER — Telehealth: Payer: Self-pay | Admitting: *Deleted

## 2016-02-21 DIAGNOSIS — I214 Non-ST elevation (NSTEMI) myocardial infarction: Secondary | ICD-10-CM

## 2016-02-21 NOTE — Telephone Encounter (Signed)
Dennis Mullen called to let us know that he was cleared to come back, but he does not have a note to return.  We will send a note to Dr. Cleda Mccreedy for clearance.  Dennis Mullen expressed interest in changing to 4 pm class, but we currently do not have any room in that class.  We will let Dennis Mullen know when he can return.

## 2016-02-23 ENCOUNTER — Encounter: Payer: Self-pay | Admitting: *Deleted

## 2016-02-23 DIAGNOSIS — I214 Non-ST elevation (NSTEMI) myocardial infarction: Secondary | ICD-10-CM

## 2016-02-23 NOTE — Progress Notes (Signed)
Cardiac Individual Treatment Plan  Patient Details  Name: Dennis Mullen MRN: [id number] Date of Birth: [birth year] Referring Provider:   Flowsheet Row Cardiac Rehab from 01/13/2016 in [facility] Cardiac and Pulmonary Rehab  Referring Provider  [name]      Initial Encounter Date:  Flowsheet Row Cardiac Rehab from 01/13/2016 in [facility] Cardiac and Pulmonary Rehab  Date  01/13/16  Referring Provider  [name]      Visit Diagnosis: NSTEMI (non-ST elevated myocardial infarction) [facility])  Patient's Home Medications on Admission:  Current Outpatient Prescriptions:  .  allopurinol (ZYLOPRIM) 100 MG tablet, Take 100 mg by mouth every morning., Disp: , Rfl:  .  amLODipine (NORVASC) 5 MG tablet, Take 5 mg by mouth daily. , Disp: , Rfl:  .  amoxicillin-clavulanate (AUGMENTIN) 875-125 MG tablet, Take 1 tablet by mouth every 12 (twelve) hours., Disp: 14 tablet, Rfl: 0 .  aspirin EC 81 MG tablet, Take 81 mg by mouth every morning. Reported on 07/26/2015, Disp: , Rfl:  .  clopidogrel (PLAVIX) 75 MG tablet, Take 75 mg by mouth every morning. Reported on 07/26/2015, Disp: , Rfl:  .  colchicine 0.6 MG tablet, , Disp: , Rfl:  .  diclofenac (VOLTAREN) 75 MG EC tablet, , Disp: , Rfl:  .  divalproex (DEPAKOTE ER) 500 MG 24 hr tablet, Take 1 tablet (500 mg total) by mouth 3 (three) times daily. Take 575m in morning and 10023min evening (Patient taking differently: Take 500-1,000 mg by mouth 2 (two) times daily. Take 50047mn morning and 1000m49m evening), Disp: 270 tablet, Rfl: 1 .  DULoxetine (CYMBALTA) 60 MG capsule, Take 1 capsule (60 mg total) by mouth daily., Disp: 90 capsule, Rfl: 1 .  esomeprazole (NEXIUM) 40 MG capsule, Take 40 mg by mouth daily. , Disp: , Rfl:  .  fenofibrate (TRICOR) 145 MG tablet, Take by mouth., Disp: , Rfl:  .  furosemide (LASIX) 40 MG tablet, Take 1 tablet (40 mg total) by mouth daily., Disp: 30 tablet, Rfl: 0 .  glucose 4 GM chewable tablet, Chew 1 tablet by mouth as needed for  low blood sugar., Disp: , Rfl:  .  insulin aspart (NOVOLOG) 100 UNIT/ML injection, Inject 12-20 Units into the skin 3 (three) times daily with meals. Reported on 10/11/2015, Disp: , Rfl:  .  Insulin Disposable Pump (V-GO 30) KIT, Reported on 06/29/2015, Disp: , Rfl:  .  isosorbide mononitrate (IMDUR) 30 MG 24 hr tablet, Take 1 tablet (30 mg total) by mouth daily. (Patient taking differently: Take 60 mg by mouth daily. ), Disp: 30 tablet, Rfl: 0 .  levothyroxine (SYNTHROID, LEVOTHROID) 50 MCG tablet, Take 50 mcg by mouth daily before breakfast., Disp: , Rfl:  .  metFORMIN (GLUCOPHAGE) 1000 MG tablet, Take 1,000 mg by mouth 2 (two) times daily with a meal., Disp: , Rfl:  .  metoprolol succinate (TOPROL-XL) 50 MG 24 hr tablet, Take 50 mg by mouth daily. , Disp: , Rfl:  .  nitroGLYCERIN (NITROSTAT) 0.4 MG SL tablet, Place 0.4 mg under the tongue every 5 (five) minutes x 3 doses as needed for chest pain. , Disp: , Rfl:  .  pioglitazone (ACTOS) 15 MG tablet, Take 15 mg by mouth every morning. , Disp: , Rfl:  .  pregabalin (LYRICA) 100 MG capsule, Take 100 mg by mouth 3 (three) times daily., Disp: , Rfl:  .  rOPINIRole (REQUIP) 1 MG tablet, Take 1 mg by mouth 2 (two) times daily. , Disp: , Rfl:  .  rosuvastatin (CRESTOR) 5 MG tablet, Take 5 mg by mouth daily at 6 PM. , Disp: , Rfl:  .  VICTOZA 18 MG/3ML SOPN, Inject 1.8 mg into the skin daily. , Disp: , Rfl:  .  vitamin C (ASCORBIC ACID) 500 MG tablet, Take 500 mg by mouth daily., Disp: , Rfl:   Past Medical History: Past Medical History:  Diagnosis Date  . Abnormal levels of other serum enzymes   . Anxiety   . Bipolar disorder ([place])   . BPH (benign prostatic hyperplasia)   . CAD (coronary artery disease)   . CHF (congestive heart failure) ([place])    ?  Marland Kitchen Depression   . Diabetes mellitus, type II ([place])   . Diabetes mellitus, type II, insulin dependent ([place])   . ED (erectile dysfunction)   . GERD (gastroesophageal reflux disease)   . Gout   .  History of borderline personality disorder   . Hypertension   . Hypogonadism in male   . Insomnia   . Mood disorder ([place])   . Neuropathy ([place])   . Obesity   . OSA (obstructive sleep apnea)   . PVD (peripheral vascular disease) ([place])   . Thyroid disease     Tobacco Use: History  Smoking Status  . Never Smoker  Smokeless Tobacco  . Never Used    Labs: Recent Review Flowsheet Data    Labs for ITP Cardiac and Pulmonary Rehab Latest Ref Rng & Units 01/29/2014 11/14/2014 11/14/2014 09/24/2015 10/11/2015   Cholestrol 0 - 200 mg/dL 223(H) - - 152 176   LDLCALC 0 - 99 mg/dL SEE COMMENT - - 46 89   HDL >40 mg/dL 34(L) - - 48 45   Trlycerides <150 mg/dL 474(H) - - 291(H) 211(H)   Hemoglobin A1c 4.0 - 6.0 % 8.4(H) 6.5(H) 6.4(H) 6.4(H) -       Exercise Target Goals:    Exercise Program Goal: Individual exercise prescription set with THRR, safety & activity barriers. Participant demonstrates ability to understand and report RPE using BORG scale, to self-measure pulse accurately, and to acknowledge the importance of the exercise prescription.  Exercise Prescription Goal: Starting with aerobic activity 30 plus minutes a day, 3 days per week for initial exercise prescription. Provide home exercise prescription and guidelines that participant acknowledges understanding prior to discharge.  Activity Barriers & Risk Stratification:     Activity Barriers & Cardiac Risk Stratification - 01/13/16 1811      Activity Barriers & Cardiac Risk Stratification   Comments Is scheduled for PFT.        6 Minute Walk:     6 Minute Walk    Row Name 01/13/16 1545         6 Minute Walk   Distance 1125 feet     Walk Time 6 minutes     # of Rest Breaks 0     MPH 2.13     METS 2.6     RPE 17     VO2 Peak 9.08     Symptoms No        Initial Exercise Prescription:     Initial Exercise Prescription - 01/13/16 1500      Date of Initial Exercise RX and Referring Provider   Date 01/13/16    Referring Provider Lacie Scotts   Level 2   Minutes 15   METs 2.5     Recumbant Bike   Level 2   RPM 60   Minutes 15  METs 2.5     NuStep   Level 3   Minutes 15   METs 2.5     Arm Ergometer   Level 2   Minutes 15   METs 2.5     REL-XR   Level 2   Minutes 15   METs 2.5     T5 Nustep   Level 2   Minutes 15   METs 2.5     Prescription Details   Frequency (times per week) 3   Duration Progress to 45 minutes of aerobic exercise without signs/symptoms of physical distress     Intensity   THRR 40-80% of Max Heartrate 105-139   Ratings of Perceived Exertion 11-13   Perceived Dyspnea 0-4     Progression   Progression Continue to progress workloads to maintain intensity without signs/symptoms of physical distress.     Resistance Training   Training Prescription Yes   Weight 3   Reps 10-15      Perform Capillary Blood Glucose checks as needed.  Exercise Prescription Changes:     Exercise Prescription Changes    Row Name 01/19/16 1400 01/21/16 0900 02/02/16 1500         Exercise Review   Progression Yes Yes Yes       Response to Exercise   Blood Pressure (Admit) 124/72  - 124/70     Blood Pressure (Exercise) 134/70  - 172/80     Blood Pressure (Exit) 126/64  - 132/70     Heart Rate (Admit) 87 bpm  - 79 bpm     Heart Rate (Exercise) 110 bpm  - 107 bpm     Heart Rate (Exit) 78 bpm  - 86 bpm     Rating of Perceived Exertion (Exercise) 14  - 14     Symptoms SOB on treadmill SOB on treadmill none     Comments  - Home Exercise Guidelines given 01/21/16 Home Exercise Guidelines given 01/21/16     Duration Progress to 45 minutes of aerobic exercise without signs/symptoms of physical distress Progress to 45 minutes of aerobic exercise without signs/symptoms of physical distress Progress to 45 minutes of aerobic exercise without signs/symptoms of physical distress     Intensity THRR unchanged THRR unchanged THRR unchanged       Progression   Progression  Continue to progress workloads to maintain intensity without signs/symptoms of physical distress. Continue to progress workloads to maintain intensity without signs/symptoms of physical distress. Continue to progress workloads to maintain intensity without signs/symptoms of physical distress.     Average METs 1.87 1.87 3.5       Resistance Training   Training Prescription Yes Yes Yes     Weight 4 lbs 4 lbs 4 lbs     Reps 10-12 10-12 10-12       Interval Training   Interval Training No No No       Treadmill   MPH 0.8 0.8  -     Grade 0 0  -     Minutes 15 15  -     METs 1.6 1.6  -       Bike   Level  -  - 1.5     Minutes  -  - 15     METs  -  - 2       Cybex   Level  - 4  -     RPM  - 50  -     Minutes  -  15  -       T5 Nustep   Level _0 Minutes _1 METs  -  - 2       Biostep-RELP   Level _2 Watts -  50 spm -  50 spm  -     Minutes _3 METs _4 Home Exercise Plan   Plans to continue exercise at  [facility] (comment)  walking and [employer] ([employer] (comment)  walking and [employer] ([employer])     Frequency  - Add 2 additional days to program exercise sessions. Add 2 additional days to program exercise sessions.        Exercise Comments:     Exercise Comments    Row Name 01/17/16 1020 01/19/16 1448 01/21/16 0926 02/02/16 1529 02/17/16 1020   Exercise Comments First full day of exercise!  Patient was oriented to gym and equipment including functions, settings, policies, and procedures.  Patient's individual exercise prescription and treatment plan were reviewed.  All starting workloads were established based on the results of the 6 minute walk test done at initial orientation visit.  The plan for exercise progression was also introduced and progression will be customized based on patient's performance and goals. [name] is off to a good start with exercise.  His blood sugars have been  low at the beginning of exercise, but he is able to get high enough to exercise after taking glucose tablets.  Today, he was low again after exercise. (64)  He was encouraged to contact his endocrinolgist about holding his insulin until after exercise.  We will follow up on  Friday.  We will continue to monitor for progression. Switched out treadmill for bike today.  [name] liked the bike much better. [name] is doing well with exercise.  He likes the [employer] bike for exercise, says he likes the breeze!  We will continue to monitor his progression. [name] has been out with cellulitis in his foot.  Waiting for clearance to return.      Discharge Exercise Prescription (Final Exercise Prescription Changes):     Exercise Prescription Changes - 02/02/16 1500      Exercise Review   Progression Yes     Response to Exercise   Blood Pressure (Admit) 124/70   Blood Pressure (Exercise) 172/80   Blood Pressure (Exit) 132/70   Heart Rate (Admit) 79 bpm   Heart Rate (Exercise) 107 bpm   Heart Rate (Exit) 86 bpm   Rating of Perceived Exertion (Exercise) 14   Symptoms none   Comments Home Exercise Guidelines given 01/21/16   Duration Progress to 45 minutes of aerobic exercise without signs/symptoms of physical distress   Intensity THRR unchanged     Progression   Progression Continue to progress workloads to maintain intensity without signs/symptoms of physical distress.   Average METs 3.5     Resistance Training   Training Prescription Yes   Weight 4 lbs   Reps 10-12     Interval Training   Interval Training No     Bike   Level 1.5   Minutes 15   METs 2     T5 Nustep   Level 3   Minutes 15   METs 2     Biostep-RELP   Level 3   Minutes 15  METs 3     Home Exercise Plan   Plans to continue exercise at [facility] (comment)  walking and [employer] ([employer])   Frequency Add 2 additional days to program exercise sessions.      Nutrition:  Target Goals: Understanding of  nutrition guidelines, daily intake of sodium <1543m, cholesterol <2056m calories 30% from fat and 7% or less from saturated fats, daily to have 5 or more servings of fruits and vegetables.  Biometrics:     Pre Biometrics - 01/13/16 1544      Pre Biometrics   Height 6' 1.25" (1.861 m)   Weight 288 lb 4.8 oz (130.8 kg)   Waist Circumference 50.75 inches   Hip Circumference 49 inches   Waist to Hip Ratio 1.04 %   BMI (Calculated) 37.9       Nutrition Therapy Plan and Nutrition Goals:     Nutrition Therapy & Goals - 01/13/16 1803      Intervention Plan   Intervention Prescribe, educate and counsel regarding individualized specific dietary modifications aiming towards targeted core components such as weight, hypertension, lipid management, diabetes, heart failure and other comorbidities.   Expected Outcomes Short Term Goal: Understand basic principles of dietary content, such as calories, fat, sodium, cholesterol and nutrients.;Short Term Goal: A plan has been developed with personal nutrition goals set during dietitian appointment.;Long Term Goal: Adherence to prescribed nutrition plan.      Nutrition Discharge: Rate Your Plate Scores:     Nutrition Assessments - 01/13/16 1803      Rate Your Plate Scores   Pre Score 71   Pre Score % 79 %      Nutrition Goals Re-Evaluation:   Psychosocial: Target Goals: Acknowledge presence or absence of depression, maximize coping skills, provide positive support system. Participant is able to verbalize types and ability to use techniques and skills needed for reducing stress and depression.  Initial Review & Psychosocial Screening:     Initial Psych Review & Screening - 01/13/16 1807      Initial Review   Current issues with Current Depression;Current Psychotropic Meds;Current Stress Concerns   Source of Stress Concerns Financial;Unable to participate in former interests or hobbies  Symptoms had kept from being active.. Hopes that  getting back to exercise routine will help resolve this concern     Family Dynamics   Good Support System? Yes  Children and friends     Barriers   Psychosocial barriers to participate in program There are no identifiable barriers or psychosocial needs.;The patient should benefit from training in stress management and relaxation.     Screening Interventions   Interventions Encouraged to exercise      Quality of Life Scores:     Quality of Life - 01/13/16 1808      Quality of Life Scores   Health/Function Pre 20.53 %   Socioeconomic Pre 19.57 %   Psych/Spiritual Pre 20.71 %   Family Pre 21 %   GLOBAL Pre 20.42 %      PHQ-9: Recent Review Flowsheet Data    [facility] 01/13/2016 07/30/2014   Decreased Interest 0  0   Down, Depressed, Hopeless 0 0   PHQ - 2 Score 0 0   Altered sleeping 3 -   Tired, decreased energy 3 -   Change in appetite 3 -   Feeling bad or failure about yourself  1 -   Trouble concentrating 1 -   Moving slowly or fidgety/restless 1 -  Suicidal thoughts 0 -   PHQ-9 Score 12 -      Psychosocial Evaluation and Intervention:     Psychosocial Evaluation - 01/24/16 1013      Psychosocial Evaluation & Interventions   Interventions Encouraged to exercise with the program and follow exercise prescription;Stress management education   Comments Counselor met with Mr. Werntz [name]) today for initial psychosocial evaluation.  [name] is a 65 year old who has multiple health issues including Cardiac; Diabetes Type 2 and Bipolar Disorder.  He has (2) Adult Children and their families who live close by and [name] is also a part of a church community.  He also has sleep apnea and uses a CPAP nightly but [name] reports he may only sleep approx. 6 hours per night 3-4 nights per week and otherwise does not sleep well at all.  He states his Dr. has prescribed Ambien PRN to help with this, but [name] "prefers not to take additional medications."  His appetite is up and  down with his endocrinologist reportedly calling him a "carbaphobic."  [name] was diagnosed with Bi-Polar Disorder shortly after his spouse left him in 2007 and attempted suicide 5X afterwards.  The latest attempt was reported to be 4-5 years ago.  He states that his current medications keep his mood stable for the most part.  He stated he is somewhat anxious and his mood is above average currently.  His stressors consist of his health, finances and some stress in relationships in his life.  He has goals to increase his stamina and strength and to exercise consistently while in this program.  Counselor encouraged [name] to take his medication for sleep as needed per Dr. recommendations.  He also would benefit from a Divorce Care support group to help with the unresolved grief from his wife leaving him almost 10 years ago.  Counselor shared this information with [name] and will be following with him throughout the course of this program.     Continued Psychosocial Services Needed Yes  Follow re: sleep and support group.  He will also benefit from the psychoeducational components of this program - especially stress management.        Psychosocial Re-Evaluation:   Vocational Rehabilitation: Provide vocational rehab assistance to qualifying candidates.   Vocational Rehab Evaluation & Intervention:     Vocational Rehab - 01/13/16 1811      Initial Vocational Rehab Evaluation & Intervention   Assessment shows need for Vocational Rehabilitation No      Education: Education Goals: Education classes will be provided on a weekly basis, covering required topics. Participant will state understanding/return demonstration of topics presented.  Learning Barriers/Preferences:     Learning Barriers/Preferences - 01/13/16 1811      Learning Barriers/Preferences   Learning Barriers None   Learning Preferences None      Education Topics: General Nutrition Guidelines/Fats and Fiber: -Group instruction  provided by verbal, written material, models and posters to present the general guidelines for heart healthy nutrition. Gives an explanation and review of dietary fats and fiber.   Controlling Sodium/Reading Food Labels: -Group verbal and written material supporting the discussion of sodium use in heart healthy nutrition. Review and explanation with models, verbal and written materials for utilization of the food label.   Exercise Physiology & Risk Factors: - Group verbal and written instruction with models to review the exercise physiology of the cardiovascular system and associated critical values. Details cardiovascular disease risk factors and the goals associated with each risk factor. Flowsheet  Row Cardiac Rehab from 01/31/2016 in [facility] Cardiac and Pulmonary Rehab  Date  01/17/16  Educator  [facility]  Instruction Review Code  2- meets goals/outcomes      Aerobic Exercise & Resistance Training: - Gives group verbal and written discussion on the health impact of inactivity. On the components of aerobic and resistive training programs and the benefits of this training and how to safely progress through these programs. Flowsheet Row Cardiac Rehab from 01/31/2016 in [facility] Cardiac and Pulmonary Rehab  Date  01/19/16  Educator  [facility]  Instruction Review Code  2- meets goals/outcomes      Flexibility, Balance, General Exercise Guidelines: - Provides group verbal and written instruction on the benefits of flexibility and balance training programs. Provides general exercise guidelines with specific guidelines to those with heart or lung disease. Demonstration and skill practice provided. Flowsheet Row Cardiac Rehab from 01/31/2016 in [facility] Cardiac and Pulmonary Rehab  Date  01/24/16  Educator  [facility]  Instruction Review Code  2- meets goals/outcomes      Stress Management: - Provides group verbal and written instruction about the health risks of elevated stress, cause of high stress, and healthy ways  to reduce stress.   Depression: - Provides group verbal and written instruction on the correlation between heart/lung disease and depressed mood, treatment options, and the stigmas associated with seeking treatment.   Anatomy & Physiology of the Heart: - Group verbal and written instruction and models provide basic cardiac anatomy and physiology, with the coronary electrical and arterial systems. Review of: AMI, Angina, Valve disease, Heart Failure, Cardiac Arrhythmia, Pacemakers, and the ICD. Flowsheet Row Cardiac Rehab from 01/31/2016 in [facility] Cardiac and Pulmonary Rehab  Date  01/31/16  Educator  CE  Instruction Review Code  2- meets goals/outcomes      Cardiac Procedures: - Group verbal and written instruction and models to describe the testing methods done to diagnose heart disease. Reviews the outcomes of the test results. Describes the treatment choices: Medical Management, Angioplasty, or Coronary Bypass Surgery.   Cardiac Medications: - Group verbal and written instruction to review commonly prescribed medications for heart disease. Reviews the medication, class of the drug, and side effects. Includes the steps to properly store meds and maintain the prescription regimen.   Go Sex-Intimacy & Heart Disease, Get SMART - Goal Setting: - Group verbal and written instruction through game format to discuss heart disease and the return to sexual intimacy. Provides group verbal and written material to discuss and apply goal setting through the application of the S.M.A.R.T. Method.   Other Matters of the Heart: - Provides group verbal, written materials and models to describe Heart Failure, Angina, Valve Disease, and Diabetes in the realm of heart disease. Includes description of the disease process and treatment options available to the cardiac patient. Flowsheet Row Cardiac Rehab from 01/31/2016 in [facility] Cardiac and Pulmonary Rehab  Date  01/31/16  Educator  CE  Instruction Review  Code  2- meets goals/outcomes      Exercise & Equipment Safety: - Individual verbal instruction and demonstration of equipment use and safety with use of the equipment. Flowsheet Row Cardiac Rehab from 01/31/2016 in [facility] Cardiac and Pulmonary Rehab  Date  01/13/16  Educator  SB  Instruction Review Code  2- meets goals/outcomes      Infection Prevention: - Provides verbal and written material to individual with discussion of infection control including proper hand washing and proper equipment cleaning during exercise session. Flowsheet Row Cardiac Rehab from  01/31/2016 in [facility] Cardiac and Pulmonary Rehab  Date  01/13/16  Educator  SB  Instruction Review Code  2- meets goals/outcomes      Falls Prevention: - Provides verbal and written material to individual with discussion of falls prevention and safety. Flowsheet Row Cardiac Rehab from 01/31/2016 in [facility] Cardiac and Pulmonary Rehab  Date  01/13/16  Educator  SB  Instruction Review Code  2- meets goals/outcomes      Diabetes: - Individual verbal and written instruction to review signs/symptoms of diabetes, desired ranges of glucose level fasting, after meals and with exercise. Advice that pre and post exercise glucose checks will be done for 3 sessions at entry of program. Flowsheet Row Cardiac Rehab from 01/31/2016 in [facility] Cardiac and Pulmonary Rehab  Date  01/13/16  Educator  SB  Instruction Review Code  2- meets goals/outcomes       Knowledge Questionnaire Score:     Knowledge Questionnaire Score - 01/13/16 1811      Knowledge Questionnaire Score   Pre Score 25/28      Core Components/Risk Factors/Patient Goals at Admission:     Personal Goals and Risk Factors at Admission - 01/13/16 1804      Core Components/Risk Factors/Patient Goals on Admission    Weight Management Obesity;Weight Maintenance;Yes   Goal Weight: Long Term 260 lb (117.9 kg)   Expected Outcomes Short Term: Continue to assess and modify  interventions until short term weight is achieved;Long Term: Adherence to nutrition and physical activity/exercise program aimed toward attainment of established weight goal;Weight Loss: Understanding of general recommendations for a balanced deficit meal plan, which promotes 1-2 lb weight loss per week and includes a negative energy balance of [phone] kcal/d   Sedentary Yes   Intervention Provide advice, education, support and counseling about physical activity/exercise needs.;Develop an individualized exercise prescription for aerobic and resistive training based on initial evaluation findings, risk stratification, comorbidities and participant's personal goals.   Expected Outcomes Achievement of increased cardiorespiratory fitness and enhanced flexibility, muscular endurance and strength shown through measurements of functional capacity and personal statement of participant.   Diabetes Yes   Intervention Provide education about signs/symptoms and action to take for hypo/hyperglycemia.;Provide education about proper nutrition, including hydration, and aerobic/resistive exercise prescription along with prescribed medications to achieve blood glucose in normal ranges: Fasting glucose 65-99 mg/dL   Expected Outcomes Short Term: Participant verbalizes understanding of the signs/symptoms and immediate care of hyper/hypoglycemia, proper foot care and importance of medication, aerobic/resistive exercise and nutrition plan for blood glucose control.;Long Term: Attainment of HbA1C < 7%.  LAst A1c 7.7%,ususally 6% or under   Hypertension Yes   Intervention Provide education on lifestyle modifcations including regular physical activity/exercise, weight management, moderate sodium restriction and increased consumption of fresh fruit, vegetables, and low fat dairy, alcohol moderation, and smoking cessation.;Monitor prescription use compliance.   Expected Outcomes Short Term: Continued assessment and intervention  until BP is < 140/17m HG in hypertensive participants. < 130/830mHG in hypertensive participants with diabetes, heart failure or chronic kidney disease.;Long Term: Maintenance of blood pressure at goal levels.   Lipids Yes   Intervention Provide education and support for participant on nutrition & aerobic/resistive exercise along with prescribed medications to achieve LDL <7030mHDL >99m9m Expected Outcomes Short Term: Participant states understanding of desired cholesterol values and is compliant with medications prescribed. Participant is following exercise prescription and nutrition guidelines.;Long Term: Cholesterol controlled with medications as prescribed, with individualized exercise RX and with personalized nutrition plan.  Value goals: LDL < 89m, HDL > 40 mg.   Stress --  Financial stress   Intervention Offer individual and/or small group education and counseling on adjustment to heart disease, stress management and health-related lifestyle change. Teach and support self-help strategies.;Refer participants experiencing significant psychosocial distress to appropriate mental health specialists for further evaluation and treatment. When possible, include family members and significant others in education/counseling sessions.   Expected Outcomes Short Term: Participant demonstrates changes in health-related behavior, relaxation and other stress management skills, ability to obtain effective social support, and compliance with psychotropic medications if prescribed.;Long Term: Emotional wellbeing is indicated by absence of clinically significant psychosocial distress or social isolation.      Core Components/Risk Factors/Patient Goals Review:    Core Components/Risk Factors/Patient Goals at Discharge (Final Review):    ITP Comments:     ITP Comments    Row Name 01/13/16 1754 01/26/16 1131 02/11/16 1041 02/21/16 1558 02/23/16 0604   ITP Comments Initial ITP created during medical review  and evaluation. Diagnosis documentation found in CARE EVERYWHERE 12/20/2015 Hospital Summary 30 day review. Continue with ITP unless changes noted by Medical Director at signature of review. New to program BMikki Santeecalled to let [place]now that he was in the hospital and diagnosed with cellulitis.  He was put on Augmentin and encouraged to get physician okay before returning to rehab. BMikki Santeecalled to let u[place]ore[place]now that he was cleared to come back, but he does not have a note to return.  We will send a note to Dr. CCleda Mccreedyfor clearance.  BMikki Santeeexpressed interest in changing to 4 pm class, but we currently do not have any room in that class.  We will let BMikki Santeeknow when he can return. 30 day review completed for Medical Director physician review and signature. Continue ITP unless changes made by physician.      Comments:

## 2016-02-24 DIAGNOSIS — E1151 Type 2 diabetes mellitus with diabetic peripheral angiopathy without gangrene: Secondary | ICD-10-CM | POA: Diagnosis not present

## 2016-02-24 DIAGNOSIS — E782 Mixed hyperlipidemia: Secondary | ICD-10-CM | POA: Diagnosis not present

## 2016-02-24 DIAGNOSIS — I25118 Atherosclerotic heart disease of native coronary artery with other forms of angina pectoris: Secondary | ICD-10-CM | POA: Diagnosis not present

## 2016-02-25 ENCOUNTER — Telehealth: Payer: Self-pay | Admitting: *Deleted

## 2016-02-25 NOTE — Telephone Encounter (Signed)
Called to let [name] know that we had his clearance to return to rehab on Monday.

## 2016-03-02 ENCOUNTER — Encounter: Payer: Self-pay | Admitting: *Deleted

## 2016-03-02 DIAGNOSIS — E1165 Type 2 diabetes mellitus with hyperglycemia: Secondary | ICD-10-CM | POA: Diagnosis not present

## 2016-03-02 DIAGNOSIS — J069 Acute upper respiratory infection, unspecified: Secondary | ICD-10-CM | POA: Diagnosis not present

## 2016-03-02 DIAGNOSIS — I10 Essential (primary) hypertension: Secondary | ICD-10-CM | POA: Diagnosis not present

## 2016-03-02 DIAGNOSIS — I214 Non-ST elevation (NSTEMI) myocardial infarction: Secondary | ICD-10-CM

## 2016-03-08 DIAGNOSIS — G4733 Obstructive sleep apnea (adult) (pediatric): Secondary | ICD-10-CM | POA: Diagnosis not present

## 2016-03-12 ENCOUNTER — Encounter: Payer: Self-pay | Admitting: Emergency Medicine

## 2016-03-12 ENCOUNTER — Inpatient Hospital Stay: Payer: Medicare Other

## 2016-03-12 ENCOUNTER — Inpatient Hospital Stay
Admission: EM | Admit: 2016-03-12 | Discharge: 2016-03-13 | DRG: 603 | Disposition: A | Discharge status: Home / Self Care | Payer: Medicare Other | Attending: Internal Medicine | Admitting: Internal Medicine

## 2016-03-12 ENCOUNTER — Emergency Department: Payer: Medicare Other

## 2016-03-12 DIAGNOSIS — E669 Obesity, unspecified: Secondary | ICD-10-CM | POA: Diagnosis present

## 2016-03-12 DIAGNOSIS — Z794 Long term (current) use of insulin: Secondary | ICD-10-CM

## 2016-03-12 DIAGNOSIS — Z79899 Other long term (current) drug therapy: Secondary | ICD-10-CM

## 2016-03-12 DIAGNOSIS — E119 Type 2 diabetes mellitus without complications: Secondary | ICD-10-CM | POA: Diagnosis not present

## 2016-03-12 DIAGNOSIS — N179 Acute kidney failure, unspecified: Secondary | ICD-10-CM | POA: Diagnosis present

## 2016-03-12 DIAGNOSIS — E1140 Type 2 diabetes mellitus with diabetic neuropathy, unspecified: Secondary | ICD-10-CM | POA: Diagnosis present

## 2016-03-12 DIAGNOSIS — I509 Heart failure, unspecified: Secondary | ICD-10-CM | POA: Diagnosis present

## 2016-03-12 DIAGNOSIS — I110 Hypertensive heart disease with heart failure: Secondary | ICD-10-CM | POA: Diagnosis present

## 2016-03-12 DIAGNOSIS — R0602 Shortness of breath: Secondary | ICD-10-CM | POA: Diagnosis not present

## 2016-03-12 DIAGNOSIS — M109 Gout, unspecified: Secondary | ICD-10-CM | POA: Diagnosis present

## 2016-03-12 DIAGNOSIS — Z8249 Family history of ischemic heart disease and other diseases of the circulatory system: Secondary | ICD-10-CM | POA: Diagnosis not present

## 2016-03-12 DIAGNOSIS — Z7982 Long term (current) use of aspirin: Secondary | ICD-10-CM | POA: Diagnosis not present

## 2016-03-12 DIAGNOSIS — E1151 Type 2 diabetes mellitus with diabetic peripheral angiopathy without gangrene: Secondary | ICD-10-CM | POA: Diagnosis present

## 2016-03-12 DIAGNOSIS — I10 Essential (primary) hypertension: Secondary | ICD-10-CM | POA: Diagnosis not present

## 2016-03-12 DIAGNOSIS — Z6836 Body mass index (BMI) 36.0-36.9, adult: Secondary | ICD-10-CM

## 2016-03-12 DIAGNOSIS — I252 Old myocardial infarction: Secondary | ICD-10-CM

## 2016-03-12 DIAGNOSIS — E785 Hyperlipidemia, unspecified: Secondary | ICD-10-CM | POA: Diagnosis not present

## 2016-03-12 DIAGNOSIS — E079 Disorder of thyroid, unspecified: Secondary | ICD-10-CM | POA: Diagnosis not present

## 2016-03-12 DIAGNOSIS — Z9641 Presence of insulin pump (external) (internal): Secondary | ICD-10-CM | POA: Diagnosis present

## 2016-03-12 DIAGNOSIS — Z955 Presence of coronary angioplasty implant and graft: Secondary | ICD-10-CM

## 2016-03-12 DIAGNOSIS — I119 Hypertensive heart disease without heart failure: Secondary | ICD-10-CM | POA: Diagnosis not present

## 2016-03-12 DIAGNOSIS — K219 Gastro-esophageal reflux disease without esophagitis: Secondary | ICD-10-CM | POA: Diagnosis present

## 2016-03-12 DIAGNOSIS — I2510 Atherosclerotic heart disease of native coronary artery without angina pectoris: Secondary | ICD-10-CM | POA: Diagnosis not present

## 2016-03-12 DIAGNOSIS — F603 Borderline personality disorder: Secondary | ICD-10-CM | POA: Diagnosis present

## 2016-03-12 DIAGNOSIS — F319 Bipolar disorder, unspecified: Secondary | ICD-10-CM | POA: Diagnosis present

## 2016-03-12 DIAGNOSIS — M7989 Other specified soft tissue disorders: Secondary | ICD-10-CM | POA: Diagnosis not present

## 2016-03-12 DIAGNOSIS — F3181 Bipolar II disorder: Secondary | ICD-10-CM | POA: Diagnosis present

## 2016-03-12 DIAGNOSIS — Z7902 Long term (current) use of antithrombotics/antiplatelets: Secondary | ICD-10-CM | POA: Diagnosis not present

## 2016-03-12 DIAGNOSIS — L03116 Cellulitis of left lower limb: Secondary | ICD-10-CM | POA: Diagnosis not present

## 2016-03-12 DIAGNOSIS — E1069 Type 1 diabetes mellitus with other specified complication: Secondary | ICD-10-CM

## 2016-03-12 DIAGNOSIS — G4733 Obstructive sleep apnea (adult) (pediatric): Secondary | ICD-10-CM | POA: Diagnosis not present

## 2016-03-12 LAB — CBC WITH DIFFERENTIAL/PLATELET
Basophils Absolute: 0.1 10*3/uL (ref 0–0.1)
Basophils Relative: 1 %
Eosinophils Absolute: 0.2 10*3/uL (ref 0–0.7)
Eosinophils Relative: 2 %
HEMATOCRIT: 31.3 % — AB (ref 40.0–52.0)
HEMOGLOBIN: 10.3 g/dL — AB (ref 13.0–18.0)
LYMPHS ABS: 1.4 10*3/uL (ref 1.0–3.6)
LYMPHS PCT: 15 %
MCH: 27.5 pg (ref 26.0–34.0)
MCHC: 32.9 g/dL (ref 32.0–36.0)
MCV: 83.6 fL (ref 80.0–100.0)
MONOS PCT: 8 %
Monocytes Absolute: 0.7 10*3/uL (ref 0.2–1.0)
NEUTROS ABS: 6.8 10*3/uL — AB (ref 1.4–6.5)
NEUTROS PCT: 74 %
Platelets: 351 10*3/uL (ref 150–440)
RBC: 3.74 MIL/uL — AB (ref 4.40–5.90)
RDW: 17.1 % — ABNORMAL HIGH (ref 11.5–14.5)
WBC: 9.2 10*3/uL (ref 3.8–10.6)

## 2016-03-12 LAB — BASIC METABOLIC PANEL
Anion gap: 10 (ref 5–15)
BUN: 33 mg/dL — AB (ref 6–20)
CHLORIDE: 103 mmol/L (ref 101–111)
CO2: 25 mmol/L (ref 22–32)
Calcium: 8.7 mg/dL — ABNORMAL LOW (ref 8.9–10.3)
Creatinine, Ser: 1.68 mg/dL — ABNORMAL HIGH (ref 0.61–1.24)
GFR calc Af Amer: 48 mL/min — ABNORMAL LOW (ref >60)
GFR calc non Af Amer: 42 mL/min — ABNORMAL LOW (ref >60)
GLUCOSE: 109 mg/dL — AB (ref 65–99)
POTASSIUM: 4.5 mmol/L (ref 3.5–5.1)
SODIUM: 138 mmol/L (ref 135–145)

## 2016-03-12 LAB — GLUCOSE, CAPILLARY
GLUCOSE-CAPILLARY: 130 mg/dL — AB (ref 65–99)
Glucose-Capillary: 122 mg/dL — ABNORMAL HIGH (ref 65–99)

## 2016-03-12 MED ORDER — ENOXAPARIN SODIUM 40 MG/0.4ML ~~LOC~~ SOLN
40.0000 mg | SUBCUTANEOUS | Status: DC
  Administered 2016-03-12: 40 mg via SUBCUTANEOUS
  Filled 2016-03-12: qty 0.4

## 2016-03-12 MED ORDER — FUROSEMIDE 10 MG/ML IJ SOLN
INTRAMUSCULAR | Status: AC
  Administered 2016-03-12: 20 mg via INTRAVENOUS
  Filled 2016-03-12: qty 2

## 2016-03-12 MED ORDER — ASPIRIN EC 81 MG PO TBEC
81.0000 mg | DELAYED_RELEASE_TABLET | Freq: Every morning | ORAL | Status: DC
  Administered 2016-03-13: 81 mg via ORAL
  Filled 2016-03-12: qty 1

## 2016-03-12 MED ORDER — ONDANSETRON HCL 4 MG PO TABS: 4.0000 mg | ORAL_TABLET | Freq: Four times a day (QID) | ORAL | Status: DC | PRN

## 2016-03-12 MED ORDER — IPRATROPIUM-ALBUTEROL 0.5-2.5 (3) MG/3ML IN SOLN
3.0000 mL | Freq: Four times a day (QID) | RESPIRATORY_TRACT | Status: DC | PRN
  Administered 2016-03-12: 3 mL via RESPIRATORY_TRACT

## 2016-03-12 MED ORDER — IPRATROPIUM-ALBUTEROL 0.5-2.5 (3) MG/3ML IN SOLN
RESPIRATORY_TRACT | Status: AC
  Administered 2016-03-12: 3 mL via RESPIRATORY_TRACT
  Filled 2016-03-12: qty 3

## 2016-03-12 MED ORDER — NITROGLYCERIN 0.4 MG SL SUBL: 0.4000 mg | SUBLINGUAL_TABLET | SUBLINGUAL | Status: DC | PRN

## 2016-03-12 MED ORDER — SODIUM CHLORIDE 0.9 % IV SOLN
INTRAVENOUS | Status: DC
  Administered 2016-03-12: 19:00:00 via INTRAVENOUS

## 2016-03-12 MED ORDER — DOCUSATE SODIUM 100 MG PO CAPS
100.0000 mg | ORAL_CAPSULE | Freq: Two times a day (BID) | ORAL | Status: DC
  Administered 2016-03-12 – 2016-03-13 (×2): 100 mg via ORAL
  Filled 2016-03-12 (×2): qty 1

## 2016-03-12 MED ORDER — INSULIN ASPART 100 UNIT/ML ~~LOC~~ SOLN: 0.0000 [IU] | Freq: Every day | SUBCUTANEOUS | Status: DC

## 2016-03-12 MED ORDER — VANCOMYCIN HCL 10 G IV SOLR
1500.0000 mg | INTRAVENOUS | Status: DC
  Administered 2016-03-13: 1500 mg via INTRAVENOUS
  Filled 2016-03-12: qty 1500

## 2016-03-12 MED ORDER — ONDANSETRON HCL 4 MG/2ML IJ SOLN: 4.0000 mg | Freq: Four times a day (QID) | INTRAMUSCULAR | Status: DC | PRN

## 2016-03-12 MED ORDER — PANTOPRAZOLE SODIUM 40 MG PO TBEC
40.0000 mg | DELAYED_RELEASE_TABLET | Freq: Every day | ORAL | Status: DC
  Administered 2016-03-13: 40 mg via ORAL
  Filled 2016-03-12: qty 1

## 2016-03-12 MED ORDER — ROPINIROLE HCL 1 MG PO TABS
1.0000 mg | ORAL_TABLET | Freq: Two times a day (BID) | ORAL | Status: DC
  Administered 2016-03-12 – 2016-03-13 (×2): 1 mg via ORAL
  Filled 2016-03-12 (×4): qty 1

## 2016-03-12 MED ORDER — PREGABALIN 50 MG PO CAPS
100.0000 mg | ORAL_CAPSULE | Freq: Three times a day (TID) | ORAL | Status: DC
  Administered 2016-03-12 – 2016-03-13 (×3): 100 mg via ORAL
  Filled 2016-03-12 (×3): qty 2

## 2016-03-12 MED ORDER — INSULIN ASPART 100 UNIT/ML ~~LOC~~ SOLN
0.0000 [IU] | Freq: Three times a day (TID) | SUBCUTANEOUS | Status: DC
  Administered 2016-03-13: 2 [IU] via SUBCUTANEOUS
  Filled 2016-03-12: qty 2

## 2016-03-12 MED ORDER — FUROSEMIDE 10 MG/ML IJ SOLN
20.0000 mg | Freq: Once | INTRAMUSCULAR | Status: AC
  Administered 2016-03-12: 20 mg via INTRAVENOUS

## 2016-03-12 MED ORDER — ENOXAPARIN SODIUM 30 MG/0.3ML ~~LOC~~ SOLN: 30.0000 mg | SUBCUTANEOUS | Status: DC

## 2016-03-12 MED ORDER — ISOSORBIDE MONONITRATE ER 30 MG PO TB24
30.0000 mg | ORAL_TABLET | Freq: Every day | ORAL | Status: DC
  Administered 2016-03-13: 30 mg via ORAL
  Filled 2016-03-12: qty 1

## 2016-03-12 MED ORDER — METOPROLOL SUCCINATE ER 50 MG PO TB24
50.0000 mg | ORAL_TABLET | Freq: Every day | ORAL | Status: DC
  Administered 2016-03-13: 50 mg via ORAL
  Filled 2016-03-12: qty 1

## 2016-03-12 MED ORDER — ACETAMINOPHEN 325 MG PO TABS: 650.0000 mg | ORAL_TABLET | Freq: Four times a day (QID) | ORAL | Status: DC | PRN

## 2016-03-12 MED ORDER — ROSUVASTATIN CALCIUM 10 MG PO TABS
5.0000 mg | ORAL_TABLET | Freq: Every day | ORAL | Status: DC
  Administered 2016-03-12: 5 mg via ORAL
  Filled 2016-03-12: qty 1

## 2016-03-12 MED ORDER — CLOPIDOGREL BISULFATE 75 MG PO TABS
75.0000 mg | ORAL_TABLET | Freq: Every morning | ORAL | Status: DC
  Administered 2016-03-13: 75 mg via ORAL
  Filled 2016-03-12: qty 1

## 2016-03-12 MED ORDER — AMLODIPINE BESYLATE 5 MG PO TABS
5.0000 mg | ORAL_TABLET | Freq: Every day | ORAL | Status: DC
  Administered 2016-03-13: 5 mg via ORAL
  Filled 2016-03-12: qty 1

## 2016-03-12 MED ORDER — LEVOTHYROXINE SODIUM 50 MCG PO TABS
50.0000 ug | ORAL_TABLET | Freq: Every day | ORAL | Status: DC
  Administered 2016-03-13: 50 ug via ORAL
  Filled 2016-03-12: qty 1

## 2016-03-12 MED ORDER — OXYCODONE HCL 5 MG PO TABS: 5.0000 mg | ORAL_TABLET | ORAL | Status: DC | PRN

## 2016-03-12 MED ORDER — DULOXETINE HCL 60 MG PO CPEP
60.0000 mg | ORAL_CAPSULE | Freq: Every day | ORAL | Status: DC
Start: 2016-03-12 — End: 2016-03-13
  Administered 2016-03-13: 60 mg via ORAL
  Filled 2016-03-12: qty 1

## 2016-03-12 MED ORDER — VANCOMYCIN HCL 10 G IV SOLR
1500.0000 mg | Freq: Once | INTRAVENOUS | Status: AC
  Administered 2016-03-12: 1500 mg via INTRAVENOUS
  Filled 2016-03-12: qty 1500

## 2016-03-12 MED ORDER — DIVALPROEX SODIUM ER 500 MG PO TB24
500.0000 mg | ORAL_TABLET | Freq: Three times a day (TID) | ORAL | Status: DC
  Administered 2016-03-12: 500 mg via ORAL
  Filled 2016-03-12: qty 1

## 2016-03-12 MED ORDER — PIOGLITAZONE HCL 15 MG PO TABS
15.0000 mg | ORAL_TABLET | Freq: Every morning | ORAL | Status: DC
  Filled 2016-03-12: qty 1

## 2016-03-12 MED ORDER — ACETAMINOPHEN 650 MG RE SUPP: 650.0000 mg | Freq: Four times a day (QID) | RECTAL | Status: DC | PRN

## 2016-03-12 NOTE — Progress Notes (Signed)
Pharmacy Antibiotic Note  Dennis Mullen is a 65 y.o. male admitted on 03/12/2016 with cellulitis.  Pharmacy has been consulted for Vancomycin dosing.  Ke=0.058, T1/2=12hr, Vd=70.5L Will need to watch for accummulation.  Plan: Vancomycin 1500 IV every 18 hours.  Goal trough 10-15 mcg/mL.  Height: 6\' 2"  (188 cm) Weight: 283 lb (128.4 kg) IBW/kg (Calculated) : 82.2  Temp (24hrs), Avg:97.6 F (36.4 C), Min:97.6 F (36.4 C), Max:97.6 F (36.4 C)   Recent Labs Lab 03/12/16 1336  WBC 9.2  CREATININE 1.68*    Estimated Creatinine Clearance: 64.1 mL/min (by C-G formula based on SCr of 1.68 mg/dL (H)).    No Known Allergies  Antimicrobials this admission: Vancomycin 11/26 >>   Dose adjustments this admission:  Microbiology results:  Thank you for allowing pharmacy to be a part of this patient's care.  Paulina Fusi, PharmD, BCPS 03/12/2016 7:07 PM

## 2016-03-12 NOTE — H&P (Signed)
Dennis Mullen NAME: Dennis Mullen    MR#:  [id number]  DATE OF BIRTH:  07/07/1950  DATE OF ADMISSION:  03/12/2016  PRIMARY CARE PHYSICIAN: Lavera Guise, MD   REQUESTING/REFERRING PHYSICIAN: McShane  CHIEF COMPLAINT:  Left leg swelling and redness  HISTORY OF PRESENT ILLNESS:  Dennis Mullen  is a 65 y.o. male with a known history of Insulin-dependent diabetes mellitus, coronary artery disease status post multiple stent placement, last stent was placed at [facility] in September, on aspirin, Plavix and multiple other medical problems is presenting to the ED with a chief complaint of left leg swelling, pain and redness. Patient has noticed some lows and dried of skin on his second toe on the left leg which he peeled off manually following that he started noticing redness and swelling in his left leg. Denies any fevers or discharge. DVT ruled out in the emergency department  PAST MEDICAL HISTORY:   Past Medical History:  Diagnosis Date  . Abnormal levels of other serum enzymes   . Anxiety   . Bipolar disorder ([place])   . BPH (benign prostatic hyperplasia)   . CAD (coronary artery disease)   . CHF (congestive heart failure) ([place])    ?  Marland Kitchen Depression   . Diabetes mellitus, type II ([place])   . Diabetes mellitus, type II, insulin dependent ([place])   . ED (erectile dysfunction)   . GERD (gastroesophageal reflux disease)   . Gout   . History of borderline personality disorder   . Hypertension   . Hypogonadism in male   . Insomnia   . Mood disorder ([place])   . Neuropathy ([place])   . Obesity   . OSA (obstructive sleep apnea)   . PVD (peripheral vascular disease) ([place])   . Thyroid disease     PAST SURGICAL HISTOIRY:   Past Surgical History:  Procedure Laterality Date  . CARDIAC CATHETERIZATION N/A 10/12/2015   Procedure: Left Heart Cath and Coronary Angiography;  Surgeon: Teodoro Spray, MD;  Location: [facility];   Service: Cardiovascular;  Laterality: N/A;  . CORONARY ANGIOPLASTY WITH STENT PLACEMENT     x5 stents  . FOOT SURGERY    . MASTOIDECTOMY Right   . NASAL SEPTUM SURGERY    . penial inplant    . PENILE PROSTHESIS IMPLANT    . right mastoidectomy    . SCROTOPLASTY    . TONSILLECTOMY AND ADENOIDECTOMY      SOCIAL HISTORY:   Social History  Substance Use Topics  . Smoking status: Never Smoker  . Smokeless tobacco: Never Used  . Alcohol use No    FAMILY HISTORY:   Family History  Problem Relation Age of Onset  . Lung cancer Mother   . Heart attack Father   . Post-traumatic stress disorder Father   . Anxiety disorder Father   . Depression Father   . Heart attack Sister   . Heart attack Brother     DRUG ALLERGIES:  No Known Allergies  REVIEW OF SYSTEMS:  CONSTITUTIONAL: No fever, fatigue or weakness.  EYES: No blurred or double vision.  EARS, NOSE, AND THROAT: No tinnitus or ear pain.  RESPIRATORY: No cough, shortness of breath, wheezing or hemoptysis.  CARDIOVASCULAR: No chest pain, orthopnea, edema.  GASTROINTESTINAL: No nausea, vomiting, diarrhea or abdominal pain.  GENITOURINARY: No dysuria, hematuria.  ENDOCRINE: No polyuria, nocturia,  HEMATOLOGY: No anemia, easy bruising or bleeding SKIN: No rash or lesion.  MUSCULOSKELETAL:Left leg swelling and redness with pain No joint pain or arthritis.   NEUROLOGIC: No tingling, numbness, weakness.  PSYCHIATRY: No anxiety or depression.   MEDICATIONS AT HOME:   Prior to Admission medications   Medication Sig Start Date End Date Taking? Authorizing Provider  allopurinol (ZYLOPRIM) 100 MG tablet Take 100 mg by mouth every morning.    Historical Provider, MD  amLODipine (NORVASC) 5 MG tablet Take 5 mg by mouth daily.  10/14/14   Historical Provider, MD  aspirin EC 81 MG tablet Take 81 mg by mouth every morning. Reported on 07/26/2015    Historical Provider, MD  clopidogrel (PLAVIX) 75 MG tablet Take 75 mg by mouth every  morning. Reported on 07/26/2015    Historical Provider, MD  colchicine 0.6 MG tablet  11/08/15   Historical Provider, MD  diclofenac (VOLTAREN) 75 MG EC tablet  12/09/15   Historical Provider, MD  divalproex (DEPAKOTE ER) 500 MG 24 hr tablet Take 1 tablet (500 mg total) by mouth 3 (three) times daily. Take '500mg'$  in morning and '1000mg'$  in evening Patient taking differently: Take 500-1,000 mg by mouth 2 (two) times daily. Take '500mg'$  in morning and '1000mg'$  in evening 11/02/15   Gonzella Lex, MD  DULoxetine (CYMBALTA) 60 MG capsule Take 1 capsule (60 mg total) by mouth daily. 11/02/15   Gonzella Lex, MD  esomeprazole (NEXIUM) 40 MG capsule Take 40 mg by mouth daily.  10/14/14   Historical Provider, MD  fenofibrate (TRICOR) 145 MG tablet Take by mouth. 12/23/15 12/22/16  Historical Provider, MD  furosemide (LASIX) 40 MG tablet Take 1 tablet (40 mg total) by mouth daily. 09/25/15   Loletha Grayer, MD  glucose 4 GM chewable tablet Chew 1 tablet by mouth as needed for low blood sugar.    Historical Provider, MD  insulin aspart (NOVOLOG) 100 UNIT/ML injection Inject 12-20 Units into the skin 3 (three) times daily with meals. Reported on 10/11/2015    Historical Provider, MD  Insulin Disposable Pump (V-GO 30) KIT Reported on 06/29/2015 10/07/14   Historical Provider, MD  isosorbide mononitrate (IMDUR) 30 MG 24 hr tablet Take 1 tablet (30 mg total) by mouth daily. Patient taking differently: Take 60 mg by mouth daily.  10/12/15   Vaughan Basta, MD  levothyroxine (SYNTHROID, LEVOTHROID) 50 MCG tablet Take 50 mcg by mouth daily before breakfast.    Historical Provider, MD  metFORMIN (GLUCOPHAGE) 1000 MG tablet Take 1,000 mg by mouth 2 (two) times daily with a meal.    Historical Provider, MD  metoprolol succinate (TOPROL-XL) 50 MG 24 hr tablet Take 50 mg by mouth daily.  09/11/14   Historical Provider, MD  nitroGLYCERIN (NITROSTAT) 0.4 MG SL tablet Place 0.4 mg under the tongue every 5 (five) minutes x 3 doses as  needed for chest pain.     Historical Provider, MD  pioglitazone (ACTOS) 15 MG tablet Take 15 mg by mouth every morning.     Historical Provider, MD  pregabalin (LYRICA) 100 MG capsule Take 100 mg by mouth 3 (three) times daily.    Historical Provider, MD  rOPINIRole (REQUIP) 1 MG tablet Take 1 mg by mouth 2 (two) times daily.  04/27/15   Historical Provider, MD  rosuvastatin (CRESTOR) 5 MG tablet Take 5 mg by mouth daily at 6 PM.  07/05/15   Historical Provider, MD  VICTOZA 18 MG/3ML SOPN Inject 1.8 mg into the skin daily.  09/07/14   Historical Provider, MD  vitamin C (ASCORBIC ACID) 500  MG tablet Take 500 mg by mouth daily.    Historical Provider, MD      VITAL SIGNS:  Blood pressure 128/65, pulse 66, temperature 97.6 F (36.4 C), temperature source Oral, resp. rate 18, height _0  (1.88 m), weight 128.4 kg (283 lb), SpO2 99 %.  PHYSICAL EXAMINATION:  GENERAL:  65 y.o.-year-old patient lying in the bed with no acute distress. Obese EYES: Pupils equal, round, reactive to light and accommodation. No scleral icterus. Extraocular muscles intact.  HEENT: Head atraumatic, normocephalic. Oropharynx and nasopharynx clear.  NECK:  Supple, no jugular venous distention. No thyroid enlargement, no tenderness.  LUNGS: Normal breath sounds bilaterally, no wheezing, rales,rhonchi or crepitation. No use of accessory muscles of respiration.  CARDIOVASCULAR: S1, S2 normal. No murmurs, rubs, or gallops.  ABDOMEN: Soft, nontender, nondistended. Bowel sounds present. No organomegaly or mass.  EXTREMITIES: No pedal edema, cyanosis, or clubbing.  NEUROLOGIC: Cranial nerves II through XII are intact. Muscle strength 5/5 in all extremities. Sensation intact. Gait not checked.  PSYCHIATRIC: The patient is alert and oriented x 3.  SKIN: Left leg is edematous, erythematous and tender up to midcalf level. Left foot second toe is swollen and dried up blood No obvious rash, lesion, or ulcer.   LABORATORY PANEL:    CBC  Recent Labs Lab 03/12/16 1336  WBC 9.2  HGB 10.3*  HCT 31.3*  PLT 351   ------------------------------------------------------------------------------------------------------------------  Chemistries   Recent Labs Lab 03/12/16 1336  NA 138  K 4.5  CL 103  CO2 25  GLUCOSE 109*  BUN 33*  CREATININE 1.68*  CALCIUM 8.7*   ------------------------------------------------------------------------------------------------------------------  Cardiac Enzymes No results for input(s): TROPONINI in the last 168 hours. ------------------------------------------------------------------------------------------------------------------  RADIOLOGY:  US Venous Img Lower Unilateral Left  Result Date: 03/12/2016 CLINICAL DATA:  65 year old male with left lower extremity swelling for the past week EXAM: LEFT LOWER EXTREMITY VENOUS DOPPLER ULTRASOUND TECHNIQUE: Gray-scale sonography with graded compression, as well as color Doppler and duplex ultrasound were performed to evaluate the lower extremity deep venous systems from the level of the common femoral vein and including the common femoral, femoral, profunda femoral, popliteal and calf veins including the posterior tibial, peroneal and gastrocnemius veins when visible. The superficial great saphenous vein was also interrogated. Spectral Doppler was utilized to evaluate flow at rest and with distal augmentation maneuvers in the common femoral, femoral and popliteal veins. COMPARISON:  None. FINDINGS: Contralateral Common Femoral Vein: Respiratory phasicity is normal and symmetric with the symptomatic side. No evidence of thrombus. Normal compressibility. Common Femoral Vein: No evidence of thrombus. Normal compressibility, respiratory phasicity and response to augmentation. Saphenofemoral Junction: No evidence of thrombus. Normal compressibility and flow on color Doppler imaging. Profunda Femoral Vein: No evidence of thrombus. Normal  compressibility and flow on color Doppler imaging. Femoral Vein: No evidence of thrombus. Normal compressibility, respiratory phasicity and response to augmentation. Popliteal Vein: No evidence of thrombus. Normal compressibility, respiratory phasicity and response to augmentation. Calf Veins: No evidence of thrombus. Normal compressibility and flow on color Doppler imaging. Superficial Great Saphenous Vein: No evidence of thrombus. Normal compressibility and flow on color Doppler imaging. Venous Reflux:  None. Other Findings:  None. IMPRESSION: No evidence of deep venous thrombosis. Electronically Signed   By: Jacqulynn Cadet M.D.   On: 03/12/2016 14:38    EKG:   Orders placed or performed during the hospital encounter of 12/20/15  . EKG 12-Lead  . EKG 12-Lead  . EKG 12-Lead  . EKG 12-Lead  .  ED EKG  . ED EKG  . EKG    IMPRESSION AND PLAN:    Reagan Klemz  is a 65 y.o. male with a known history of Insulin-dependent diabetes mellitus, coronary artery disease status post multiple stent placement, last stent was placed at [facility] in September, on aspirin, Plavix and multiple other medical problems is presenting to the ED with a chief complaint of left leg swelling, pain and redness. Patient has noticed some lows and dried of skin on his second toe on the left leg which he peeled off manually   # Left leg cellulitis : With history of insulin-dependent diabetes mellitus Admit to med- surg unit Start patient on vancomycin and pharmacy to dose Monitor clinically DVT ruled out with negative Dopplers  #Insulin-dependent diabetes mellitus Patient is on insulin pump and will continue the same and provide sliding scale insulin for additional coverage Hold metformin Diabetic coordinator consult is placed  #Acute kidney injury mild Holding metformin, NSAIDs and colchicine for now Provided gentle hydration with IV fluids and repeat BMP in a.m. Avoid nephrotoxins and monitor renal  function  #History of coronary artery disease status post multiple stents Continue aspirin, Plavix, statin and Imdur Outpatient follow-up with cardiology at [facility] as recommended  #Hyperlipidemia continue statin   DVT prophylaxis with Lovenox subcutaneous   All the records are reviewed and case discussed with ED provider. Management plans discussed with the patient, family and they are in agreement.  CODE STATUS: fc, son is HCPOA  TOTAL TIME TAKING CARE OF THIS PATIENT: 43 minutes.   Note: This dictation was prepared with Dragon dictation along with smaller phrase technology. Any transcriptional errors that result from this process are unintentional.  Nicholes Mango M.D on 03/12/2016 at 3:48 PM  Between 7am to 6pm - Pager - [phone]  After 6pm go to [email or web address] - password [email or web address] [facility]  Office  [phone]  CC: Primary care physician; Lavera Guise, MD

## 2016-03-12 NOTE — ED Provider Notes (Signed)
[facility] Emergency Department Provider Note  ____________________________________________   I have reviewed the triage vital signs and the nursing notes.   HISTORY  Chief Complaint Leg Swelling    HPI Dennis Mullen is a 65 y.o. male with a history of poorly controlled diabetes, and peripheral last disease presents today with cellulitic changes to left anterior leg. Patient states he picked some skin off his second toe approximately one week ago, and since then has been red and swollen with now redness going up his leg. It is tender. He has chronic left lower extremity swelling he states he has had negative Doppler ultrasounds of this area. He states it is more swollen than usual. Denies chest pain or shortness of breath or fever or other systemic symptoms. Patient has pain there although he has very difficult time feeling things in general the bottom of his feet.       Past Medical History:  Diagnosis Date  . Abnormal levels of other serum enzymes   . Anxiety   . Bipolar disorder ([place])   . BPH (benign prostatic hyperplasia)   . CAD (coronary artery disease)   . CHF (congestive heart failure) ([place])    ?  Marland Kitchen Depression   . Diabetes mellitus, type II ([place])   . Diabetes mellitus, type II, insulin dependent ([place])   . ED (erectile dysfunction)   . GERD (gastroesophageal reflux disease)   . Gout   . History of borderline personality disorder   . Hypertension   . Hypogonadism in male   . Insomnia   . Mood disorder ([place])   . Neuropathy ([place])   . Obesity   . OSA (obstructive sleep apnea)   . PVD (peripheral vascular disease) ([place])   . Thyroid disease     Patient Active Problem List   Diagnosis Date Noted  . Insulin overdose 02/02/2016  . Hypoglycemia   . OSA on CPAP   . Depression   . Hypophosphatemia   . SOB (shortness of breath) 10/11/2015  . Elevated troponin 10/11/2015  . Chest pain at rest 09/24/2015  . Elevated PSA 07/26/2015  .  Hypogonadism in male 07/26/2015  . 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 BPH with obstruction/lower urinary tract symptoms 06/29/2015  . Bipolar 1 disorder, mixed ([place]) 04/14/2015  . Borderline personality disorder 04/14/2015  . Acute renal failure ([place]) 11/15/2014  . Chest pain 11/14/2014  . Dizziness 11/05/2014  . Benign essential HTN 11/04/2014  . Combined fat and carbohydrate induced hyperlipemia 11/04/2014  . Bipolar 2 disorder ([place]) 10/27/2014  . Acquired hypothyroidism 02/03/2014  . Type 2 diabetes mellitus ([place]) 02/03/2014  . Adiposity 01/06/2014  . Obesity, diabetes, and hypertension syndrome ([place]) 12/23/2013  . Long term current use of insulin ([place]) 12/23/2013  . Type 2 diabetes mellitus with other diabetic neurological complication 40/11/6759  . Disorder affecting the body's metabolism 12/23/2013  . Acute inflammation of the pancreas 11/18/2013  . Elevated cholesterol with elevated triglycerides 11/18/2013  . CAD (coronary artery disease), native coronary artery 09/26/2013  . Diabetes mellitus ([place]) 09/26/2013  . Acute non-ST elevation myocardial infarction (NSTEMI) ([place]) 09/26/2013  . Non-ST elevation (NSTEMI) myocardial infarction [facility]) 09/26/2013    Past Surgical History:  Procedure Laterality Date  . CARDIAC CATHETERIZATION N/A 10/12/2015   Procedure: Left Heart Cath and Coronary Angiography;  Surgeon: Teodoro Spray, MD;  Location: [facility];  Service: Cardiovascular;  Laterality: N/A;  . CORONARY ANGIOPLASTY WITH STENT PLACEMENT  x5 stents  . FOOT SURGERY    . MASTOIDECTOMY Right   . NASAL SEPTUM SURGERY    . penial inplant    . PENILE PROSTHESIS IMPLANT    . right mastoidectomy    . SCROTOPLASTY    . TONSILLECTOMY AND ADENOIDECTOMY      Prior to Admission medications   Medication Sig Start Date End Date Taking? Authorizing Provider  allopurinol (ZYLOPRIM) 100 MG tablet Take 100 mg by mouth every morning.    Historical Provider,  MD  amLODipine (NORVASC) 5 MG tablet Take 5 mg by mouth daily.  10/14/14   Historical Provider, MD  amoxicillin-clavulanate (AUGMENTIN) 875-125 MG tablet Take 1 tablet by mouth every 12 (twelve) hours. 02/03/16   Vaughan Basta, MD  aspirin EC 81 MG tablet Take 81 mg by mouth every morning. Reported on 07/26/2015    Historical Provider, MD  clopidogrel (PLAVIX) 75 MG tablet Take 75 mg by mouth every morning. Reported on 07/26/2015    Historical Provider, MD  colchicine 0.6 MG tablet  11/08/15   Historical Provider, MD  diclofenac (VOLTAREN) 75 MG EC tablet  12/09/15   Historical Provider, MD  divalproex (DEPAKOTE ER) 500 MG 24 hr tablet Take 1 tablet (500 mg total) by mouth 3 (three) times daily. Take 553m in morning and 10069min evening Patient taking differently: Take 500-1,000 mg by mouth 2 (two) times daily. Take 5002mn morning and 1000m4m evening 11/02/15   JohnGonzella Lex  DULoxetine (CYMBALTA) 60 MG capsule Take 1 capsule (60 mg total) by mouth daily. 11/02/15   JohnGonzella Lex  esomeprazole (NEXIUM) 40 MG capsule Take 40 mg by mouth daily.  10/14/14   Historical Provider, MD  fenofibrate (TRICOR) 145 MG tablet Take by mouth. 12/23/15 12/22/16  Historical Provider, MD  furosemide (LASIX) 40 MG tablet Take 1 tablet (40 mg total) by mouth daily. 09/25/15   RichLoletha Grayer  glucose 4 GM chewable tablet Chew 1 tablet by mouth as needed for low blood sugar.    Historical Provider, MD  insulin aspart (NOVOLOG) 100 UNIT/ML injection Inject 12-20 Units into the skin 3 (three) times daily with meals. Reported on 10/11/2015    Historical Provider, MD  Insulin Disposable Pump (V-GO 30) KIT Reported on 06/29/2015 10/07/14   Historical Provider, MD  isosorbide mononitrate (IMDUR) 30 MG 24 hr tablet Take 1 tablet (30 mg total) by mouth daily. Patient taking differently: Take 60 mg by mouth daily.  10/12/15   VaibVaughan Basta  levothyroxine (SYNTHROID, LEVOTHROID) 50 MCG tablet Take 50 mcg  by mouth daily before breakfast.    Historical Provider, MD  metFORMIN (GLUCOPHAGE) 1000 MG tablet Take 1,000 mg by mouth 2 (two) times daily with a meal.    Historical Provider, MD  metoprolol succinate (TOPROL-XL) 50 MG 24 hr tablet Take 50 mg by mouth daily.  09/11/14   Historical Provider, MD  nitroGLYCERIN (NITROSTAT) 0.4 MG SL tablet Place 0.4 mg under the tongue every 5 (five) minutes x 3 doses as needed for chest pain.     Historical Provider, MD  pioglitazone (ACTOS) 15 MG tablet Take 15 mg by mouth every morning.     Historical Provider, MD  pregabalin (LYRICA) 100 MG capsule Take 100 mg by mouth 3 (three) times daily.    Historical Provider, MD  rOPINIRole (REQUIP) 1 MG tablet Take 1 mg by mouth 2 (two) times daily.  04/27/15   Historical Provider, MD  rosuvastatin (CRESTOR) 5  MG tablet Take 5 mg by mouth daily at 6 PM.  07/05/15   Historical Provider, MD  VICTOZA 18 MG/3ML SOPN Inject 1.8 mg into the skin daily.  09/07/14   Historical Provider, MD  vitamin C (ASCORBIC ACID) 500 MG tablet Take 500 mg by mouth daily.    Historical Provider, MD    Allergies Patient has no known allergies.  Family History  Problem Relation Age of Onset  . Lung cancer Mother   . Heart attack Father   . Post-traumatic stress disorder Father   . Anxiety disorder Father   . Depression Father   . Heart attack Sister   . Heart attack Brother     Social History Social History  Substance Use Topics  . Smoking status: Never Smoker  . Smokeless tobacco: Never Used  . Alcohol use No    Review of Systems Constitutional: No fever/chills Eyes: No visual changes. ENT: No sore throat. No stiff neck no neck pain Cardiovascular: Denies chest pain. Respiratory: Denies shortness of breath. Gastrointestinal:   no vomiting.  No diarrhea.  No constipation. Genitourinary: Negative for dysuria. Musculoskeletal: Negative lower extremity swelling Skin:Positive for rash Neurological: Negative for severe  headaches, focal weakness or numbness. 10-point ROS otherwise negative.  ____________________________________________   PHYSICAL EXAM:  VITAL SIGNS: ED Triage Vitals  Enc Vitals Group     BP 03/12/16 1239 128/65     Pulse Rate 03/12/16 1239 66     Resp 03/12/16 1239 18     Temp 03/12/16 1239 97.6 F (36.4 C)     Temp Source 03/12/16 1239 Oral     SpO2 03/12/16 1239 99 %     Weight 03/12/16 1240 283 lb (128.4 kg)     Height 03/12/16 1240 6' 2"  (1.88 m)     Head Circumference --      Peak Flow --      Pain Score 03/12/16 1240 10     Pain Loc --      Pain Edu? --      Excl. in [place]? --     Constitutional: Alert and oriented. Well appearing and in no acute distress. Eyes: Conjunctivae are normal. PERRL. EOMI. Head: Atraumatic. Nose: No congestion/rhinnorhea. Mouth/Throat: Mucous membranes are moist.  Oropharynx non-erythematous. Neck: No stridor.   Nontender with no meningismus Cardiovascular: Normal rate, regular rhythm. Grossly normal heart sounds.  Good peripheral circulation. Respiratory: Normal respiratory effort.  No retractions. Lungs CTAB. Abdominal: Soft and nontender. No distention. No guarding no rebound Back:  There is no focal tenderness or step off.  there is no midline tenderness there are no lesions noted. there is no CVA tenderness Musculoskeletal There is skin abraded off the distal tip of the second toe on the left with cellulitic changes in that toe, and evidence that that site with a changes streaking up to the mid calf. There is left greater than right edema noted, faint pulses palpated symmetrically bilaterally. There is no crepitus, there is no fluid collection. However, left anterior leg is hot to touch and red.  No joint effusions, no DVT signs strong distal pulses no edema Neurologic:  Normal speech and language. No gross focal neurologic deficits are appreciated.  Skin:  Skin is warm, dry and intact. No rash noted. Psychiatric: Mood and affect are  normal. Speech and behavior are normal.  ____________________________________________   LABS (all labs ordered are listed, but only abnormal results are displayed)  Labs Reviewed  CBC WITH DIFFERENTIAL/PLATELET - Abnormal; Notable for  the following:       Result Value   RBC 3.74 (*)    Hemoglobin 10.3 (*)    HCT 31.3 (*)    RDW 17.1 (*)    Neutro Abs 6.8 (*)    All other components within normal limits  BASIC METABOLIC PANEL - Abnormal; Notable for the following:    Glucose, Bld 109 (*)    BUN 33 (*)    Creatinine, Ser 1.68 (*)    Calcium 8.7 (*)    GFR calc non Af Amer 42 (*)    GFR calc Af Amer 48 (*)    All other components within normal limits   ____________________________________________  EKG  I personally interpreted any EKGs ordered by me or triage  ____________________________________________  RADIOLOGY  I reviewed any imaging ordered by me or triage that were performed during my shift and, if possible, patient and/or family made aware of any abnormal findings. ____________________________________________   PROCEDURES  Procedure(s) performed: None  Procedures  Critical Care performed: None  ____________________________________________   INITIAL IMPRESSION / ASSESSMENT AND PLAN / ED COURSE  Pertinent labs & imaging results that were available during my care of the patient were reviewed by me and considered in my medical decision making (see chart for details).  Patient with poorly controlled diabetes for further neuropathy and peripheral vascular disease presents with a toe infection which is now going up his calf. We will give him clindamycin to cover MRSA, white count is reassuring he is nontoxic however given his comorbidities and do think admission is warranted. Patient discussed with hospitalist and they agree with management and will admit.  Clinical Course    ____________________________________________   FINAL CLINICAL IMPRESSION(S) / ED  DIAGNOSES  Final diagnoses:  None      This chart was dictated using voice recognition software.  Despite best efforts to proofread,  errors can occur which can change meaning.      Schuyler Amor, MD 03/12/16 [phone]

## 2016-03-12 NOTE — Progress Notes (Signed)
Anticoagulation monitoring(Lovenox):  65yo  male ordered Lovenox 30 mg Q24h for DVT prevention  Filed Weights   03/12/16 1240  Weight: 283 lb (128.4 kg)   BMI    Lab Results  Component Value Date   CREATININE 1.68 (H) 03/12/2016   CREATININE 1.32 (H) 02/03/2016   CREATININE 1.24 02/02/2016   Estimated Creatinine Clearance: 64.1 mL/min (by C-G formula based on SCr of 1.68 mg/dL (H)). Hemoglobin & Hematocrit     Component Value Date/Time   HGB 10.3 (L) 03/12/2016 1336   HGB 14.7 01/26/2014 0503   HCT 31.3 (L) 03/12/2016 1336   HCT 45.6 01/26/2014 0503     Per Protocol for Patient with estCrcl > 30 ml/min and BMI < 40, will transition to Lovenox 40 mg Q24h     Paulina Fusi, PharmD, BCPS 03/12/2016 6:25 PM

## 2016-03-12 NOTE — ED Triage Notes (Signed)
Patient to ER for c/o left leg swelling. States he has had swelling with ultrasound x2. Now has redness and warmth to area. States leg feels like it's burning.

## 2016-03-13 ENCOUNTER — Telehealth: Payer: Self-pay | Admitting: Psychiatry

## 2016-03-13 ENCOUNTER — Ambulatory Visit: Payer: 59 | Admitting: Psychiatry

## 2016-03-13 DIAGNOSIS — I509 Heart failure, unspecified: Secondary | ICD-10-CM | POA: Diagnosis not present

## 2016-03-13 DIAGNOSIS — Z955 Presence of coronary angioplasty implant and graft: Secondary | ICD-10-CM | POA: Diagnosis not present

## 2016-03-13 DIAGNOSIS — I2510 Atherosclerotic heart disease of native coronary artery without angina pectoris: Secondary | ICD-10-CM | POA: Diagnosis not present

## 2016-03-13 DIAGNOSIS — E1151 Type 2 diabetes mellitus with diabetic peripheral angiopathy without gangrene: Secondary | ICD-10-CM | POA: Diagnosis not present

## 2016-03-13 DIAGNOSIS — Z7902 Long term (current) use of antithrombotics/antiplatelets: Secondary | ICD-10-CM | POA: Diagnosis not present

## 2016-03-13 DIAGNOSIS — Z79899 Other long term (current) drug therapy: Secondary | ICD-10-CM | POA: Diagnosis not present

## 2016-03-13 DIAGNOSIS — E1140 Type 2 diabetes mellitus with diabetic neuropathy, unspecified: Secondary | ICD-10-CM | POA: Diagnosis not present

## 2016-03-13 DIAGNOSIS — Z794 Long term (current) use of insulin: Secondary | ICD-10-CM | POA: Diagnosis not present

## 2016-03-13 DIAGNOSIS — M109 Gout, unspecified: Secondary | ICD-10-CM | POA: Diagnosis not present

## 2016-03-13 DIAGNOSIS — Z8249 Family history of ischemic heart disease and other diseases of the circulatory system: Secondary | ICD-10-CM | POA: Diagnosis not present

## 2016-03-13 DIAGNOSIS — I110 Hypertensive heart disease with heart failure: Secondary | ICD-10-CM | POA: Diagnosis not present

## 2016-03-13 DIAGNOSIS — E785 Hyperlipidemia, unspecified: Secondary | ICD-10-CM | POA: Diagnosis not present

## 2016-03-13 DIAGNOSIS — N179 Acute kidney failure, unspecified: Secondary | ICD-10-CM | POA: Diagnosis not present

## 2016-03-13 DIAGNOSIS — Z9641 Presence of insulin pump (external) (internal): Secondary | ICD-10-CM | POA: Diagnosis not present

## 2016-03-13 DIAGNOSIS — Z7982 Long term (current) use of aspirin: Secondary | ICD-10-CM | POA: Diagnosis not present

## 2016-03-13 DIAGNOSIS — L03116 Cellulitis of left lower limb: Secondary | ICD-10-CM | POA: Diagnosis not present

## 2016-03-13 LAB — COMPREHENSIVE METABOLIC PANEL
ALBUMIN: 3 g/dL — AB (ref 3.5–5.0)
ALT: 17 U/L (ref 17–63)
ANION GAP: 7 (ref 5–15)
AST: 25 U/L (ref 15–41)
Alkaline Phosphatase: 46 U/L (ref 38–126)
BUN: 25 mg/dL — ABNORMAL HIGH (ref 6–20)
CHLORIDE: 104 mmol/L (ref 101–111)
CO2: 27 mmol/L (ref 22–32)
Calcium: 8.8 mg/dL — ABNORMAL LOW (ref 8.9–10.3)
Creatinine, Ser: 1.2 mg/dL (ref 0.61–1.24)
GFR calc non Af Amer: >60 mL/min (ref >60)
GLUCOSE: 98 mg/dL (ref 65–99)
POTASSIUM: 4.6 mmol/L (ref 3.5–5.1)
SODIUM: 138 mmol/L (ref 135–145)
Total Bilirubin: 0.7 mg/dL (ref 0.3–1.2)
Total Protein: 7.1 g/dL (ref 6.5–8.1)

## 2016-03-13 LAB — CBC
HCT: 33.7 % — ABNORMAL LOW (ref 40.0–52.0)
HEMOGLOBIN: 11.2 g/dL — AB (ref 13.0–18.0)
MCH: 27.6 pg (ref 26.0–34.0)
MCHC: 33.1 g/dL (ref 32.0–36.0)
MCV: 83.5 fL (ref 80.0–100.0)
PLATELETS: 324 10*3/uL (ref 150–440)
RBC: 4.04 MIL/uL — ABNORMAL LOW (ref 4.40–5.90)
RDW: 17.5 % — AB (ref 11.5–14.5)
WBC: 7.2 10*3/uL (ref 3.8–10.6)

## 2016-03-13 LAB — GLUCOSE, CAPILLARY
GLUCOSE-CAPILLARY: 92 mg/dL (ref 65–99)
Glucose-Capillary: 165 mg/dL — ABNORMAL HIGH (ref 65–99)

## 2016-03-13 MED ORDER — SULFAMETHOXAZOLE-TRIMETHOPRIM 800-160 MG PO TABS: 1.0000 | ORAL_TABLET | Freq: Two times a day (BID) | ORAL | 0 refills | Status: DC

## 2016-03-13 MED ORDER — ISOSORBIDE MONONITRATE ER 30 MG PO TB24: 60.0000 mg | ORAL_TABLET | Freq: Every day | ORAL | Status: DC

## 2016-03-13 MED ORDER — FUROSEMIDE 40 MG PO TABS
40.0000 mg | ORAL_TABLET | Freq: Every day | ORAL | Status: DC
  Administered 2016-03-13: 40 mg via ORAL
  Filled 2016-03-13: qty 1

## 2016-03-13 MED ORDER — SULFAMETHOXAZOLE-TRIMETHOPRIM 800-160 MG PO TABS
1.0000 | ORAL_TABLET | Freq: Two times a day (BID) | ORAL | Status: DC
  Administered 2016-03-13: 1 via ORAL
  Filled 2016-03-13: qty 1

## 2016-03-13 MED ORDER — ALLOPURINOL 100 MG PO TABS: 100.0000 mg | ORAL_TABLET | Freq: Every day | ORAL | Status: DC

## 2016-03-13 MED ORDER — DIVALPROEX SODIUM ER 500 MG PO TB24: 1000.0000 mg | ORAL_TABLET | Freq: Every day | ORAL | Status: DC

## 2016-03-13 MED ORDER — DIVALPROEX SODIUM ER 500 MG PO TB24: 500.0000 mg | ORAL_TABLET | Freq: Every morning | ORAL | Status: DC

## 2016-03-13 NOTE — Progress Notes (Signed)
Pt alert and sitting up. Pt described medical issues with feet and complications from diabetes. CH offered prayer.   03/13/16 1215  Clinical Encounter Type  Visited With Patient  Visit Type Initial  Referral From Nurse  Spiritual Encounters  Spiritual Needs Emotional  Stress Factors  Patient Stress Factors None identified

## 2016-03-13 NOTE — Progress Notes (Signed)
Inpatient Diabetes Program Recommendations  AACE/ADA: New Consensus Statement on Inpatient Glycemic Control (2015)  Target Ranges:  Prepandial:   less than 140 mg/dL      Peak postprandial:   less than 180 mg/dL (1-2 hours)      Critically ill patients:  140 - 180 mg/dL   Results for KHYRI, BARCO (MRN [id number]) as of 03/13/2016 10:44  Ref. Range 03/12/2016 18:37 03/12/2016 22:08 03/13/2016 07:39  Glucose-Capillary Latest Ref Range: 65 - 99 mg/dL 122 (H) 130 (H) 92    Admit with: LLE Cellulitis  History: DM  Home DM Meds: V-GO 20 Insulin Disposable Insulin Pump       Metformin 1000 mg BID       Actos 15 mg daily       Victoza 1.8 mg daily  Current Insulin Orders: Actos 15 mg daily        Novolog Sensitive Correction Scale/ SSI (0-9 units) TID AC + HS       -Patient sees Dr. Adella H[facility] with the [facility] for [facility] management.  -Last A1c on file was 6.4% from 09/24/15.  -Glucose levels well controlled so far here in hospital.     MD- Spoke with pt this AM.  Removed his VGO-20 insulin pump this AM around 7am.  Does not have access to getting more VGO insulin pump supplies.    With patient's VGO-20 insulin pump, patient gets about 20 units basal insulin per 24 hour period with the pump.  Also boluses self with approximately 8-10 units rapid-acting insulin per meal.  MD- Please place orders for Lantus 20 units daily (give 1st dose today) while patient off his insulin pump     --Will follow patient during hospitalization--  Wyn Quaker RN, MSN, CDE Diabetes Coordinator Inpatient Glycemic Control Team Team Pager: [phone] (8a-5p)

## 2016-03-13 NOTE — Discharge Summary (Signed)
[place] [facility] [place] NAME: Dennis Mullen    MR#:  [id number]  DATE OF BIRTH:  06/26/1950  DATE OF ADMISSION:  03/12/2016 ADMITTING PHYSICIAN: Nicholes Mango, MD  DATE OF DISCHARGE: 03/13/16  PRIMARY CARE PHYSICIAN: Lavera Guise, MD    ADMISSION DIAGNOSIS:  Cellulitis of left lower extremity [K93.267]  DISCHARGE DIAGNOSIS:  Left LE cellulitis improving  SECONDARY DIAGNOSIS:   Past Medical History:  Diagnosis Date  . Abnormal levels of other serum enzymes   . Anxiety   . Bipolar disorder ([place])   . BPH (benign prostatic hyperplasia)   . CAD (coronary artery disease)   . CHF (congestive heart failure) ([place])    ?  Marland Kitchen Depression   . Diabetes mellitus, type II ([facility])   . Diabetes mellitus, type II, insulin dependent ([place])   . ED (erectile dysfunction)   . GERD (gastroesophageal reflux disease)   . Gout   . History of borderline personality disorder   . Hypertension   . Hypogonadism in male   . Insomnia   . Mood disorder ([place])   . Neuropathy ([place])   . Obesity   . OSA (obstructive sleep apnea)   . PVD (peripheral vascular disease) ([place])   . Thyroid disease     HOSPITAL COURSE:  Dennis Mullen  is a 65 y.o. male with a known history of Insulin-dependent diabetes mellitus, coronary artery disease status post multiple stent placement, last stent was placed at [facility] in September, on aspirin, Plavix and multiple other medical problems is presenting to the ED with a chief complaint of left leg swelling, pain and redness. Patient has noticed some lows and dried of skin on his second toe on the left leg which he peeled off manually   # Left leg cellulitis : With history of insulin-dependent diabetes mellitus on vancomycin ---change to po bactrim DVT ruled out with negative Dopplers  #Insulin-dependent diabetes mellitus Patient is on insulin pump and will continue the same and provide sliding scale insulin for additional  coverage resume metformin  #Acute kidney injury mild Stable now  #History of coronary artery disease status post multiple stents Continue aspirin, Plavix, statin and Imdur Outpatient follow-up with cardiology at [facility] as recommended  #Hyperlipidemia continue statin  Overall improved with swelling and cellulitis Pt will f/u with Dr troxler as outpt   CONSULTS OBTAINED:    DRUG ALLERGIES:  No Known Allergies  DISCHARGE MEDICATIONS:   Current Discharge Medication List    START taking these medications   Details  sulfamethoxazole-trimethoprim (BACTRIM DS,SEPTRA DS) 800-160 MG tablet Take 1 tablet by mouth every 12 (twelve) hours. Qty: 20 tablet, Refills: 0      CONTINUE these medications which have NOT CHANGED   Details  allopurinol (ZYLOPRIM) 100 MG tablet Take 100 mg by mouth every morning.    amLODipine (NORVASC) 5 MG tablet Take 5 mg by mouth daily.     aspirin EC 81 MG tablet Take 81 mg by mouth every morning. Reported on 07/26/2015    clopidogrel (PLAVIX) 75 MG tablet Take 75 mg by mouth every morning. Reported on 07/26/2015    colchicine 0.6 MG tablet     diclofenac (VOLTAREN) 75 MG EC tablet Take 75 mg by mouth 3 times/day as needed-between meals & bedtime.     divalproex (DEPAKOTE ER) 500 MG 24 hr tablet Take 1 tablet (500 mg total) by mouth 3 (three) times daily. Take 570m in morning and 10014min  evening Qty: 270 tablet, Refills: 1    DULoxetine (CYMBALTA) 60 MG capsule Take 1 capsule (60 mg total) by mouth daily. Qty: 90 capsule, Refills: 1    esomeprazole (NEXIUM) 40 MG capsule Take 40 mg by mouth daily.     fenofibrate (TRICOR) 145 MG tablet Take by mouth.    furosemide (LASIX) 40 MG tablet Take 1 tablet (40 mg total) by mouth daily. Qty: 30 tablet, Refills: 0    glucose 4 GM chewable tablet Chew 1 tablet by mouth as needed for low blood sugar.    insulin aspart (NOVOLOG) 100 UNIT/ML injection Inject 12-20 Units into the skin 3 (three) times  daily with meals. Reported on 10/11/2015    Insulin Disposable Pump (V-GO 30) KIT Reported on 06/29/2015    isosorbide mononitrate (IMDUR) 30 MG 24 hr tablet Take 1 tablet (30 mg total) by mouth daily. Qty: 30 tablet, Refills: 0    levothyroxine (SYNTHROID, LEVOTHROID) 50 MCG tablet Take 50 mcg by mouth daily before breakfast.    metFORMIN (GLUCOPHAGE) 1000 MG tablet Take 1,000 mg by mouth 2 (two) times daily with a meal.    metoprolol succinate (TOPROL-XL) 50 MG 24 hr tablet Take 50 mg by mouth daily.     nitroGLYCERIN (NITROSTAT) 0.4 MG SL tablet Place 0.4 mg under the tongue every 5 (five) minutes x 3 doses as needed for chest pain.     pregabalin (LYRICA) 100 MG capsule Take 100 mg by mouth 3 (three) times daily.    rOPINIRole (REQUIP) 1 MG tablet Take 1 mg by mouth 2 (two) times daily.     rosuvastatin (CRESTOR) 20 MG tablet Take 20 mg by mouth daily.    VICTOZA 18 MG/3ML SOPN Inject 1.8 mg into the skin daily.     vitamin C (ASCORBIC ACID) 500 MG tablet Take 500 mg by mouth daily.        If you experience worsening of your admission symptoms, develop shortness of breath, life threatening emergency, suicidal or homicidal thoughts you must seek medical attention immediately by calling 911 or calling your MD immediately  if symptoms less severe.  You Must read complete instructions/literature along with all the possible adverse reactions/side effects for all the Medicines you take and that have been prescribed to you. Take any new Medicines after you have completely understood and accept all the possible adverse reactions/side effects.   Please note  You were cared for by a hospitalist during your hospital stay. If you have any questions about your discharge medications or the care you received while you were in the hospital after you are discharged, you can call the unit and asked to speak with the hospitalist on call if the hospitalist that took care of you is not available. Once  you are discharged, your primary care physician will handle any further medical issues. Please note that NO REFILLS for any discharge medications will be authorized once you are discharged, as it is imperative that you return to your primary care physician (or establish a relationship with a primary care physician if you do not have one) for your aftercare needs so that they can reassess your need for medications and monitor your lab values. Today   SUBJECTIVE    Doing well. No foot pain VITAL SIGNS:  Blood pressure (!) 97/35, pulse (!) 57, temperature 97.4 F (36.3 C), resp. rate 18, height 6' 2"  (1.88 m), weight 128.4 kg (283 lb), SpO2 90 %.  I/O:   Intake/Output Summary (Last  24 hours) at 03/13/16 1621 Last data filed at 03/13/16 1417  Gross per 24 hour  Intake              980 ml  Output             2675 ml  Net            -1695 ml    PHYSICAL EXAMINATION:  GENERAL:  65 y.o.-year-old patient lying in the bed with no acute distress.  EYES: Pupils equal, round, reactive to light and accommodation. No scleral icterus. Extraocular muscles intact.  HEENT: Head atraumatic, normocephalic. Oropharynx and nasopharynx clear.  NECK:  Supple, no jugular venous distention. No thyroid enlargement, no tenderness.  LUNGS: Normal breath sounds bilaterally, no wheezing, rales,rhonchi or crepitation. No use of accessory muscles of respiration.  CARDIOVASCULAR: S1, S2 normal. No murmurs, rubs, or gallops.  ABDOMEN: Soft, non-tender, non-distended. Bowel sounds present. No organomegaly or mass.  EXTREMITIES: No pedal edema, cyanosis, or clubbing. Left 2nd toe superficial ulcer.  NEUROLOGIC: Cranial nerves II through XII are intact. Muscle strength 5/5 in all extremities. Sensation intact. Gait not checked.  PSYCHIATRIC: The patient is alert and oriented x 3.  SKIN: No obvious rash, lesion, or ulcer.   DATA REVIEW:   CBC   Recent Labs Lab 03/13/16 0621  WBC 7.2  HGB 11.2*  HCT 33.7*  PLT  324    Chemistries   Recent Labs Lab 03/13/16 0621  NA 138  K 4.6  CL 104  CO2 27  GLUCOSE 98  BUN 25*  CREATININE 1.20  CALCIUM 8.8*  AST 25  ALT 17  ALKPHOS 46  BILITOT 0.7    Microbiology Results   No results found for this or any previous visit (from the past 240 hour(s)).  RADIOLOGY:  Dg Chest 1 View  Result Date: 03/12/2016 CLINICAL DATA:  Shortness of breath EXAM: CHEST 1 VIEW COMPARISON:  12/20/2015 FINDINGS: AP portable semi-erect view of the chest demonstrates mild cardiomegaly. There is central vascular congestion and diffuse interstitial opacities, consistent with pulmonary edema. No large effusion. No pneumothorax. IMPRESSION: Cardiomegaly with central vascular congestion and mild diffuse interstitial pulmonary edema. Electronically Signed   By: Donavan Foil M.D.   On: 03/12/2016 22:05   US Venous Img Lower Unilateral Left  Result Date: 03/12/2016 CLINICAL DATA:  65 year old male with left lower extremity swelling for the past week EXAM: LEFT LOWER EXTREMITY VENOUS DOPPLER ULTRASOUND TECHNIQUE: Gray-scale sonography with graded compression, as well as color Doppler and duplex ultrasound were performed to evaluate the lower extremity deep venous systems from the level of the common femoral vein and including the common femoral, femoral, profunda femoral, popliteal and calf veins including the posterior tibial, peroneal and gastrocnemius veins when visible. The superficial great saphenous vein was also interrogated. Spectral Doppler was utilized to evaluate flow at rest and with distal augmentation maneuvers in the common femoral, femoral and popliteal veins. COMPARISON:  None. FINDINGS: Contralateral Common Femoral Vein: Respiratory phasicity is normal and symmetric with the symptomatic side. No evidence of thrombus. Normal compressibility. Common Femoral Vein: No evidence of thrombus. Normal compressibility, respiratory phasicity and response to augmentation.  Saphenofemoral Junction: No evidence of thrombus. Normal compressibility and flow on color Doppler imaging. Profunda Femoral Vein: No evidence of thrombus. Normal compressibility and flow on color Doppler imaging. Femoral Vein: No evidence of thrombus. Normal compressibility, respiratory phasicity and response to augmentation. Popliteal Vein: No evidence of thrombus. Normal compressibility, respiratory phasicity and response to augmentation.  Calf Veins: No evidence of thrombus. Normal compressibility and flow on color Doppler imaging. Superficial Great Saphenous Vein: No evidence of thrombus. Normal compressibility and flow on color Doppler imaging. Venous Reflux:  None. Other Findings:  None. IMPRESSION: No evidence of deep venous thrombosis. Electronically Signed   By: Jacqulynn Cadet M.D.   On: 03/12/2016 14:38     Management plans discussed with the patient, family and they are in agreement.  CODE STATUS:     Code Status Orders        Start     Ordered   03/12/16 1758  Full code  Continuous     03/12/16 1757    Code Status History    Date Active Date Inactive Code Status Order ID Comments User Context   02/02/2016  1:37 AM 02/03/2016 10:08 PM Full Code [id number]  Holley Raring, NP ED   10/11/2015 12:44 PM 10/12/2015  5:30 PM Full Code [id number]  Vaughan Basta, MD Inpatient   09/24/2015  3:12 AM 09/25/2015  3:06 PM Full Code [id number]  Saundra Shelling, MD Inpatient   11/14/2014  5:07 AM 11/15/2014  4:53 PM Full Code [id number]  Harrie Foreman, MD ED    Advance Directive Documentation   Flowsheet Row Most Recent Value  Type of Advance Directive  Healthcare Power of Attorney  Pre-existing out of facility DNR order (yellow form or pink MOST form)  No data  "MOST" Form in Place?  No data      TOTAL TIME TAKING CARE OF THIS PATIENT: 40 minutes.    Dennis Mullen M.D on 03/13/2016 at 4:21 PM  Between 7am to 6pm - Pager - [phone] After 6pm go to [email or web address] - password [email or web address]  [facility]  Office  [phone]  CC: Primary care physician; Lavera Guise, MD

## 2016-03-13 NOTE — Progress Notes (Addendum)
Pt with sudden onset of SOB with bilateral rhonchi and expiratory wheezing. Denies CP. O2 sats initially 84% RA, placed patient on 4L, sats up to 92%. Dr. Ara Kussmaul notifed, new orders received. Will monitor  Addendum: Pt responded well to lasix and duoneb. SOB resolved, lungs CTA. Pt diuresed 1700 cc. Educated patient on s/s of fluid overload and advised pt to notify RN if sx return.

## 2016-03-14 ENCOUNTER — Telehealth: Payer: Self-pay | Admitting: *Deleted

## 2016-03-14 ENCOUNTER — Encounter: Payer: Self-pay | Admitting: *Deleted

## 2016-03-14 ENCOUNTER — Other Ambulatory Visit: Payer: Self-pay | Admitting: Psychiatry

## 2016-03-14 DIAGNOSIS — I214 Non-ST elevation (NSTEMI) myocardial infarction: Secondary | ICD-10-CM

## 2016-03-14 MED ORDER — DIVALPROEX SODIUM ER 500 MG PO TB24: 500.0000 mg | ORAL_TABLET | Freq: Two times a day (BID) | ORAL | 3 refills | Status: DC

## 2016-03-14 NOTE — Telephone Encounter (Signed)
Dennis Mullen called and left a message stating that he was in the hospital for cellulitis.  He is to follow up as outpatient.  We will wait for clearance to return to rehab.

## 2016-03-14 NOTE — Telephone Encounter (Signed)
No problem. I will refill it.

## 2016-03-17 ENCOUNTER — Encounter: Payer: Medicare Other | Attending: Internal Medicine

## 2016-03-17 DIAGNOSIS — Z955 Presence of coronary angioplasty implant and graft: Secondary | ICD-10-CM | POA: Insufficient documentation

## 2016-03-22 ENCOUNTER — Encounter: Payer: Self-pay | Admitting: *Deleted

## 2016-03-22 DIAGNOSIS — I214 Non-ST elevation (NSTEMI) myocardial infarction: Secondary | ICD-10-CM

## 2016-03-22 NOTE — Progress Notes (Signed)
Cardiac Individual Treatment Plan  Patient Details  Name: Dennis Mullen MRN: [id number] Date of Birth: [birth year] Referring Provider:   Flowsheet Row Cardiac Rehab from 01/13/2016 in [facility] Cardiac and Pulmonary Rehab  Referring Provider  [name]      Initial Encounter Date:  Flowsheet Row Cardiac Rehab from 01/13/2016 in [facility] Cardiac and Pulmonary Rehab  Date  01/13/16  Referring Provider  [name]      Visit Diagnosis: NSTEMI (non-ST elevated myocardial infarction) [facility])  Patient's Home Medications on Admission:  Current Outpatient Prescriptions:  .  allopurinol (ZYLOPRIM) 100 MG tablet, Take 100 mg by mouth every morning., Disp: , Rfl:  .  amLODipine (NORVASC) 5 MG tablet, Take 5 mg by mouth daily. , Disp: , Rfl:  .  aspirin EC 81 MG tablet, Take 81 mg by mouth every morning. Reported on 07/26/2015, Disp: , Rfl:  .  clopidogrel (PLAVIX) 75 MG tablet, Take 75 mg by mouth every morning. Reported on 07/26/2015, Disp: , Rfl:  .  colchicine 0.6 MG tablet, , Disp: , Rfl:  .  diclofenac (VOLTAREN) 75 MG EC tablet, Take 75 mg by mouth 3 times/day as needed-between meals & bedtime. , Disp: , Rfl:  .  divalproex (DEPAKOTE ER) 500 MG 24 hr tablet, Take 1-2 tablets (500-1,000 mg total) by mouth 2 (two) times daily. Take 533m in morning and 10017min evening, Disp: 90 tablet, Rfl: 3 .  DULoxetine (CYMBALTA) 60 MG capsule, Take 1 capsule (60 mg total) by mouth daily., Disp: 90 capsule, Rfl: 1 .  esomeprazole (NEXIUM) 40 MG capsule, Take 40 mg by mouth daily. , Disp: , Rfl:  .  fenofibrate (TRICOR) 145 MG tablet, Take by mouth., Disp: , Rfl:  .  furosemide (LASIX) 40 MG tablet, Take 1 tablet (40 mg total) by mouth daily., Disp: 30 tablet, Rfl: 0 .  glucose 4 GM chewable tablet, Chew 1 tablet by mouth as needed for low blood sugar., Disp: , Rfl:  .  insulin aspart (NOVOLOG) 100 UNIT/ML injection, Inject 12-20 Units into the skin 3 (three) times daily with meals. Reported on 10/11/2015, Disp: ,  Rfl:  .  Insulin Disposable Pump (V-GO 30) KIT, Reported on 06/29/2015, Disp: , Rfl:  .  isosorbide mononitrate (IMDUR) 30 MG 24 hr tablet, Take 1 tablet (30 mg total) by mouth daily. (Patient taking differently: Take 60 mg by mouth daily. ), Disp: 30 tablet, Rfl: 0 .  levothyroxine (SYNTHROID, LEVOTHROID) 50 MCG tablet, Take 50 mcg by mouth daily before breakfast., Disp: , Rfl:  .  metFORMIN (GLUCOPHAGE) 1000 MG tablet, Take 1,000 mg by mouth 2 (two) times daily with a meal., Disp: , Rfl:  .  metoprolol succinate (TOPROL-XL) 50 MG 24 hr tablet, Take 50 mg by mouth daily. , Disp: , Rfl:  .  nitroGLYCERIN (NITROSTAT) 0.4 MG SL tablet, Place 0.4 mg under the tongue every 5 (five) minutes x 3 doses as needed for chest pain. , Disp: , Rfl:  .  pregabalin (LYRICA) 100 MG capsule, Take 100 mg by mouth 3 (three) times daily., Disp: , Rfl:  .  rOPINIRole (REQUIP) 1 MG tablet, Take 1 mg by mouth 2 (two) times daily. , Disp: , Rfl:  .  rosuvastatin (CRESTOR) 20 MG tablet, Take 20 mg by mouth daily., Disp: , Rfl:  .  sulfamethoxazole-trimethoprim (BACTRIM DS,SEPTRA DS) 800-160 MG tablet, Take 1 tablet by mouth every 12 (twelve) hours., Disp: 20 tablet, Rfl: 0 .  VICTOZA 18 MG/3ML SOPN, Inject 1.8 mg  into the skin daily. , Disp: , Rfl:  .  vitamin C (ASCORBIC ACID) 500 MG tablet, Take 500 mg by mouth daily., Disp: , Rfl:   Past Medical History: Past Medical History:  Diagnosis Date  . Abnormal levels of other serum enzymes   . Anxiety   . Bipolar disorder ([place])   . BPH (benign prostatic hyperplasia)   . CAD (coronary artery disease)   . CHF (congestive heart failure) ([place])    ?  Marland Kitchen Depression   . Diabetes mellitus, type II ([place])   . Diabetes mellitus, type II, insulin dependent ([place])   . ED (erectile dysfunction)   . GERD (gastroesophageal reflux disease)   . Gout   . History of borderline personality disorder   . Hypertension   . Hypogonadism in male   . Insomnia   . Mood disorder ([place])   .  Neuropathy ([place])   . Obesity   . OSA (obstructive sleep apnea)   . PVD (peripheral vascular disease) ([place])   . Thyroid disease     Tobacco Use: History  Smoking Status  . Never Smoker  Smokeless Tobacco  . Never Used    Labs: Recent Review Flowsheet Data    Labs for ITP Cardiac and Pulmonary Rehab Latest Ref Rng & Units 01/29/2014 11/14/2014 11/14/2014 09/24/2015 10/11/2015   Cholestrol 0 - 200 mg/dL 223(H) - - 152 176   LDLCALC 0 - 99 mg/dL SEE COMMENT - - 46 89   HDL >40 mg/dL 34(L) - - 48 45   Trlycerides <150 mg/dL 474(H) - - 291(H) 211(H)   Hemoglobin A1c 4.0 - 6.0 % 8.4(H) 6.5(H) 6.4(H) 6.4(H) -       Exercise Target Goals:    Exercise Program Goal: Individual exercise prescription set with THRR, safety & activity barriers. Participant demonstrates ability to understand and report RPE using BORG scale, to self-measure pulse accurately, and to acknowledge the importance of the exercise prescription.  Exercise Prescription Goal: Starting with aerobic activity 30 plus minutes a day, 3 days per week for initial exercise prescription. Provide home exercise prescription and guidelines that participant acknowledges understanding prior to discharge.  Activity Barriers & Risk Stratification:     Activity Barriers & Cardiac Risk Stratification - 01/13/16 1811      Activity Barriers & Cardiac Risk Stratification   Comments Is scheduled for PFT.        6 Minute Walk:     6 Minute Walk    Row Name 01/13/16 1545         6 Minute Walk   Distance 1125 feet     Walk Time 6 minutes     # of Rest Breaks 0     MPH 2.13     METS 2.6     RPE 17     VO2 Peak 9.08     Symptoms No        Initial Exercise Prescription:     Initial Exercise Prescription - 01/13/16 1500      Date of Initial Exercise RX and Referring Provider   Date 01/13/16   Referring Provider Lacie Scotts   Level 2   Minutes 15   METs 2.5     Recumbant Bike   Level 2   RPM 60   Minutes  15   METs 2.5     NuStep   Level 3   Minutes 15   METs 2.5     Arm Ergometer  Level 2   Minutes 15   METs 2.5     REL-XR   Level 2   Minutes 15   METs 2.5     T5 Nustep   Level 2   Minutes 15   METs 2.5     Prescription Details   Frequency (times per week) 3   Duration Progress to 45 minutes of aerobic exercise without signs/symptoms of physical distress     Intensity   THRR 40-80% of Max Heartrate 105-139   Ratings of Perceived Exertion 11-13   Perceived Dyspnea 0-4     Progression   Progression Continue to progress workloads to maintain intensity without signs/symptoms of physical distress.     Resistance Training   Training Prescription Yes   Weight 3   Reps 10-15      Perform Capillary Blood Glucose checks as needed.  Exercise Prescription Changes:     Exercise Prescription Changes    Row Name 01/19/16 1400 01/21/16 0900 02/02/16 1500         Exercise Review   Progression Yes Yes Yes       Response to Exercise   Blood Pressure (Admit) 124/72  - 124/70     Blood Pressure (Exercise) 134/70  - 172/80     Blood Pressure (Exit) 126/64  - 132/70     Heart Rate (Admit) 87 bpm  - 79 bpm     Heart Rate (Exercise) 110 bpm  - 107 bpm     Heart Rate (Exit) 78 bpm  - 86 bpm     Rating of Perceived Exertion (Exercise) 14  - 14     Symptoms SOB on treadmill SOB on treadmill none     Comments  - Home Exercise Guidelines given 01/21/16 Home Exercise Guidelines given 01/21/16     Duration Progress to 45 minutes of aerobic exercise without signs/symptoms of physical distress Progress to 45 minutes of aerobic exercise without signs/symptoms of physical distress Progress to 45 minutes of aerobic exercise without signs/symptoms of physical distress     Intensity THRR unchanged THRR unchanged THRR unchanged       Progression   Progression Continue to progress workloads to maintain intensity without signs/symptoms of physical distress. Continue to progress workloads to  maintain intensity without signs/symptoms of physical distress. Continue to progress workloads to maintain intensity without signs/symptoms of physical distress.     Average METs 1.87 1.87 3.5       Resistance Training   Training Prescription Yes Yes Yes     Weight 4 lbs 4 lbs 4 lbs     Reps 10-12 10-12 10-12       Interval Training   Interval Training No No No       Treadmill   MPH 0.8 0.8  -     Grade 0 0  -     Minutes 15 15  -     METs 1.6 1.6  -       Bike   Level  -  - 1.5     Minutes  -  - 15     METs  -  - 2       Cybex   Level  - 4  -     RPM  - 50  -     Minutes  - 15  -       T5 Nustep   Level _0 Minutes 15 15 15  METs  -  - 2       Biostep-RELP   Level _0 Watts -  50 spm -  50 spm  -     Minutes _1 METs _2 Home Exercise Plan   Plans to continue exercise at  [facility] (comment)  walking and [employer] (comment)  walking and [employer] ([employer])     Frequency  - Add 2 additional days to program exercise sessions. Add 2 additional days to program exercise sessions.        Exercise Comments:     Exercise Comments    Row Name 01/17/16 1020 01/19/16 1448 01/21/16 0926 02/02/16 1529 02/17/16 1020   Exercise Comments First full day of exercise!  Patient was oriented to gym and equipment including functions, settings, policies, and procedures.  Patient's individual exercise prescription and treatment plan were reviewed.  All starting workloads were established based on the results of the 6 minute walk test done at initial orientation visit.  The plan for exercise progression was also introduced and progression will be customized based on patient's performance and goals. [name] is off to a good start with exercise.  His blood sugars have been low at the beginning of exercise, but he is able to get high enough to exercise after taking glucose tablets.  Today, he was low again  after exercise. (64)  He was encouraged to contact his endocrinolgist about holding his insulin until after exercise.  We will follow up on  Friday.  We will continue to monitor for progression. Switched out treadmill for bike today.  [name] liked the bike much better. [name] is doing well with exercise.  He likes the [employer] bike for exercise, says he likes the breeze!  We will continue to monitor his progression. [name] has been out with cellulitis in his foot.  Waiting for clearance to return.      Discharge Exercise Prescription (Final Exercise Prescription Changes):     Exercise Prescription Changes - 02/02/16 1500      Exercise Review   Progression Yes     Response to Exercise   Blood Pressure (Admit) 124/70   Blood Pressure (Exercise) 172/80   Blood Pressure (Exit) 132/70   Heart Rate (Admit) 79 bpm   Heart Rate (Exercise) 107 bpm   Heart Rate (Exit) 86 bpm   Rating of Perceived Exertion (Exercise) 14   Symptoms none   Comments Home Exercise Guidelines given 01/21/16   Duration Progress to 45 minutes of aerobic exercise without signs/symptoms of physical distress   Intensity THRR unchanged     Progression   Progression Continue to progress workloads to maintain intensity without signs/symptoms of physical distress.   Average METs 3.5     Resistance Training   Training Prescription Yes   Weight 4 lbs   Reps 10-12     Interval Training   Interval Training No     Bike   Level 1.5   Minutes 15   METs 2     T5 Nustep   Level 3   Minutes 15   METs 2     Biostep-RELP   Level 3   Minutes 15   METs 3     Home Exercise Plan   Plans to continue exercise at [employer] (comment)  walking and [employer])   Frequency  Add 2 additional days to program exercise sessions.      Nutrition:  Target Goals: Understanding of nutrition guidelines, daily intake of sodium <1565m, cholesterol <2033m calories 30% from fat and 7% or less from saturated fats, daily  to have 5 or more servings of fruits and vegetables.  Biometrics:     Pre Biometrics - 01/13/16 1544      Pre Biometrics   Height 6' 1.25" (1.861 m)   Weight 288 lb 4.8 oz (130.8 kg)   Waist Circumference 50.75 inches   Hip Circumference 49 inches   Waist to Hip Ratio 1.04 %   BMI (Calculated) 37.9       Nutrition Therapy Plan and Nutrition Goals:     Nutrition Therapy & Goals - 01/13/16 1803      Intervention Plan   Intervention Prescribe, educate and counsel regarding individualized specific dietary modifications aiming towards targeted core components such as weight, hypertension, lipid management, diabetes, heart failure and other comorbidities.   Expected Outcomes Short Term Goal: Understand basic principles of dietary content, such as calories, fat, sodium, cholesterol and nutrients.;Short Term Goal: A plan has been developed with personal nutrition goals set during dietitian appointment.;Long Term Goal: Adherence to prescribed nutrition plan.      Nutrition Discharge: Rate Your Plate Scores:     Nutrition Assessments - 01/13/16 1803      Rate Your Plate Scores   Pre Score 71   Pre Score % 79 %      Nutrition Goals Re-Evaluation:   Psychosocial: Target Goals: Acknowledge presence or absence of depression, maximize coping skills, provide positive support system. Participant is able to verbalize types and ability to use techniques and skills needed for reducing stress and depression.  Initial Review & Psychosocial Screening:     Initial Psych Review & Screening - 01/13/16 1807      Initial Review   Current issues with Current Depression;Current Psychotropic Meds;Current Stress Concerns   Source of Stress Concerns Financial;Unable to participate in former interests or hobbies  Symptoms had kept from being active.. Hopes that getting back to exercise routine will help resolve this concern     Family Dynamics   Good Support System? Yes  Children and friends      Barriers   Psychosocial barriers to participate in program There are no identifiable barriers or psychosocial needs.;The patient should benefit from training in stress management and relaxation.     Screening Interventions   Interventions Encouraged to exercise      Quality of Life Scores:     Quality of Life - 01/13/16 1808      Quality of Life Scores   Health/Function Pre 20.53 %   Socioeconomic Pre 19.57 %   Psych/Spiritual Pre 20.71 %   Family Pre 21 %   GLOBAL Pre 20.42 %      PHQ-9: Recent Review Flowsheet Data    [facility] 01/13/2016 07/30/2014   Decreased Interest 0  0   Down, Depressed, Hopeless 0 0   PHQ - 2 Score 0 0   Altered sleeping 3 -   Tired, decreased energy 3 -   Change in appetite 3 -   Feeling bad or failure about yourself  1 -   Trouble concentrating 1 -   Moving slowly or fidgety/restless 1 -   Suicidal thoughts 0 -   PHQ-9 Score 12 -      Psychosocial Evaluation and Intervention:     Psychosocial Evaluation - 01/24/16  1013      Psychosocial Evaluation & Interventions   Interventions Encouraged to exercise with the program and follow exercise prescription;Stress management education   Comments Counselor met with Mr. Koloski [name]) today for initial psychosocial evaluation.  [name] is a 65 year old who has multiple health issues including Cardiac; Diabetes Type 2 and Bipolar Disorder.  He has (2) Adult Children and their families who live close by and [name] is also a part of a church community.  He also has sleep apnea and uses a CPAP nightly but [name] reports he may only sleep approx. 6 hours per night 3-4 nights per week and otherwise does not sleep well at all.  He states his Dr. has prescribed Ambien PRN to help with this, but [name] "prefers not to take additional medications."  His appetite is up and down with his endocrinologist reportedly calling him a "carbaphobic."  [name] was diagnosed with Bi-Polar Disorder shortly after his spouse  left him in 2007 and attempted suicide 5X afterwards.  The latest attempt was reported to be 4-5 years ago.  He states that his current medications keep his mood stable for the most part.  He stated he is somewhat anxious and his mood is above average currently.  His stressors consist of his health, finances and some stress in relationships in his life.  He has goals to increase his stamina and strength and to exercise consistently while in this program.  Counselor encouraged [name] to take his medication for sleep as needed per Dr. recommendations.  He also would benefit from a Divorce Care support group to help with the unresolved grief from his wife leaving him almost 10 years ago.  Counselor shared this information with [name] and will be following with him throughout the course of this program.     Continued Psychosocial Services Needed Yes  Follow re: sleep and support group.  He will also benefit from the psychoeducational components of this program - especially stress management.        Psychosocial Re-Evaluation:   Vocational Rehabilitation: Provide vocational rehab assistance to qualifying candidates.   Vocational Rehab Evaluation & Intervention:     Vocational Rehab - 01/13/16 1811      Initial Vocational Rehab Evaluation & Intervention   Assessment shows need for Vocational Rehabilitation No      Education: Education Goals: Education classes will be provided on a weekly basis, covering required topics. Participant will state understanding/return demonstration of topics presented.  Learning Barriers/Preferences:     Learning Barriers/Preferences - 01/13/16 1811      Learning Barriers/Preferences   Learning Barriers None   Learning Preferences None      Education Topics: General Nutrition Guidelines/Fats and Fiber: -Group instruction provided by verbal, written material, models and posters to present the general guidelines for heart healthy nutrition. Gives an explanation  and review of dietary fats and fiber.   Controlling Sodium/Reading Food Labels: -Group verbal and written material supporting the discussion of sodium use in heart healthy nutrition. Review and explanation with models, verbal and written materials for utilization of the food label.   Exercise Physiology & Risk Factors: - Group verbal and written instruction with models to review the exercise physiology of the cardiovascular system and associated critical values. Details cardiovascular disease risk factors and the goals associated with each risk factor. Flowsheet Row Cardiac Rehab from 01/31/2016 in [facility] Cardiac and Pulmonary Rehab  Date  01/17/16  Educator  [facility]  Instruction Review Code  2- meets goals/outcomes  Aerobic Exercise & Resistance Training: - Gives group verbal and written discussion on the health impact of inactivity. On the components of aerobic and resistive training programs and the benefits of this training and how to safely progress through these programs. Flowsheet Row Cardiac Rehab from 01/31/2016 in [facility] Cardiac and Pulmonary Rehab  Date  01/19/16  Educator  [facility]  Instruction Review Code  2- meets goals/outcomes      Flexibility, Balance, General Exercise Guidelines: - Provides group verbal and written instruction on the benefits of flexibility and balance training programs. Provides general exercise guidelines with specific guidelines to those with heart or lung disease. Demonstration and skill practice provided. Flowsheet Row Cardiac Rehab from 01/31/2016 in [facility] Cardiac and Pulmonary Rehab  Date  01/24/16  Educator  [facility]  Instruction Review Code  2- meets goals/outcomes      Stress Management: - Provides group verbal and written instruction about the health risks of elevated stress, cause of high stress, and healthy ways to reduce stress.   Depression: - Provides group verbal and written instruction on the correlation between heart/lung disease and  depressed mood, treatment options, and the stigmas associated with seeking treatment.   Anatomy & Physiology of the Heart: - Group verbal and written instruction and models provide basic cardiac anatomy and physiology, with the coronary electrical and arterial systems. Review of: AMI, Angina, Valve disease, Heart Failure, Cardiac Arrhythmia, Pacemakers, and the ICD. Flowsheet Row Cardiac Rehab from 01/31/2016 in [facility] Cardiac and Pulmonary Rehab  Date  01/31/16  Educator  CE  Instruction Review Code  2- meets goals/outcomes      Cardiac Procedures: - Group verbal and written instruction and models to describe the testing methods done to diagnose heart disease. Reviews the outcomes of the test results. Describes the treatment choices: Medical Management, Angioplasty, or Coronary Bypass Surgery.   Cardiac Medications: - Group verbal and written instruction to review commonly prescribed medications for heart disease. Reviews the medication, class of the drug, and side effects. Includes the steps to properly store meds and maintain the prescription regimen.   Go Sex-Intimacy & Heart Disease, Get SMART - Goal Setting: - Group verbal and written instruction through game format to discuss heart disease and the return to sexual intimacy. Provides group verbal and written material to discuss and apply goal setting through the application of the S.M.A.R.T. Method.   Other Matters of the Heart: - Provides group verbal, written materials and models to describe Heart Failure, Angina, Valve Disease, and Diabetes in the realm of heart disease. Includes description of the disease process and treatment options available to the cardiac patient. Flowsheet Row Cardiac Rehab from 01/31/2016 in [facility] Cardiac and Pulmonary Rehab  Date  01/31/16  Educator  CE  Instruction Review Code  2- meets goals/outcomes      Exercise & Equipment Safety: - Individual verbal instruction and demonstration of equipment use  and safety with use of the equipment. Flowsheet Row Cardiac Rehab from 01/31/2016 in [facility] Cardiac and Pulmonary Rehab  Date  01/13/16  Educator  SB  Instruction Review Code  2- meets goals/outcomes      Infection Prevention: - Provides verbal and written material to individual with discussion of infection control including proper hand washing and proper equipment cleaning during exercise session. Flowsheet Row Cardiac Rehab from 01/31/2016 in [facility] Cardiac and Pulmonary Rehab  Date  01/13/16  Educator  SB  Instruction Review Code  2- meets goals/outcomes      Falls Prevention: - Provides  verbal and written material to individual with discussion of falls prevention and safety. Flowsheet Row Cardiac Rehab from 01/31/2016 in [facility] Cardiac and Pulmonary Rehab  Date  01/13/16  Educator  SB  Instruction Review Code  2- meets goals/outcomes      Diabetes: - Individual verbal and written instruction to review signs/symptoms of diabetes, desired ranges of glucose level fasting, after meals and with exercise. Advice that pre and post exercise glucose checks will be done for 3 sessions at entry of program. Flowsheet Row Cardiac Rehab from 01/31/2016 in [facility] Cardiac and Pulmonary Rehab  Date  01/13/16  Educator  SB  Instruction Review Code  2- meets goals/outcomes       Knowledge Questionnaire Score:     Knowledge Questionnaire Score - 01/13/16 1811      Knowledge Questionnaire Score   Pre Score 25/28      Core Components/Risk Factors/Patient Goals at Admission:     Personal Goals and Risk Factors at Admission - 01/13/16 1804      Core Components/Risk Factors/Patient Goals on Admission    Weight Management Obesity;Weight Maintenance;Yes   Goal Weight: Long Term 260 lb (117.9 kg)   Expected Outcomes Short Term: Continue to assess and modify interventions until short term weight is achieved;Long Term: Adherence to nutrition and physical activity/exercise program aimed toward  attainment of established weight goal;Weight Loss: Understanding of general recommendations for a balanced deficit meal plan, which promotes 1-2 lb weight loss per week and includes a negative energy balance of [phone] kcal/d   Sedentary Yes   Intervention Provide advice, education, support and counseling about physical activity/exercise needs.;Develop an individualized exercise prescription for aerobic and resistive training based on initial evaluation findings, risk stratification, comorbidities and participant's personal goals.   Expected Outcomes Achievement of increased cardiorespiratory fitness and enhanced flexibility, muscular endurance and strength shown through measurements of functional capacity and personal statement of participant.   Diabetes Yes   Intervention Provide education about signs/symptoms and action to take for hypo/hyperglycemia.;Provide education about proper nutrition, including hydration, and aerobic/resistive exercise prescription along with prescribed medications to achieve blood glucose in normal ranges: Fasting glucose 65-99 mg/dL   Expected Outcomes Short Term: Participant verbalizes understanding of the signs/symptoms and immediate care of hyper/hypoglycemia, proper foot care and importance of medication, aerobic/resistive exercise and nutrition plan for blood glucose control.;Long Term: Attainment of HbA1C < 7%.  LAst A1c 7.7%,ususally 6% or under   Hypertension Yes   Intervention Provide education on lifestyle modifcations including regular physical activity/exercise, weight management, moderate sodium restriction and increased consumption of fresh fruit, vegetables, and low fat dairy, alcohol moderation, and smoking cessation.;Monitor prescription use compliance.   Expected Outcomes Short Term: Continued assessment and intervention until BP is < 140/52m HG in hypertensive participants. < 130/839mHG in hypertensive participants with diabetes, heart failure or chronic  kidney disease.;Long Term: Maintenance of blood pressure at goal levels.   Lipids Yes   Intervention Provide education and support for participant on nutrition & aerobic/resistive exercise along with prescribed medications to achieve LDL <7047mHDL >65m38m Expected Outcomes Short Term: Participant states understanding of desired cholesterol values and is compliant with medications prescribed. Participant is following exercise prescription and nutrition guidelines.;Long Term: Cholesterol controlled with medications as prescribed, with individualized exercise RX and with personalized nutrition plan. Value goals: LDL < 70mg58mL > 40 mg.   Stress --  Financial stress   Intervention Offer individual and/or small group education and counseling on adjustment to heart disease,  stress management and health-related lifestyle change. Teach and support self-help strategies.;Refer participants experiencing significant psychosocial distress to appropriate mental health specialists for further evaluation and treatment. When possible, include family members and significant others in education/counseling sessions.   Expected Outcomes Short Term: Participant demonstrates changes in health-related behavior, relaxation and other stress management skills, ability to obtain effective social support, and compliance with psychotropic medications if prescribed.;Long Term: Emotional wellbeing is indicated by absence of clinically significant psychosocial distress or social isolation.      Core Components/Risk Factors/Patient Goals Review:    Core Components/Risk Factors/Patient Goals at Discharge (Final Review):    ITP Comments:     ITP Comments    Row Name 01/13/16 1754 01/26/16 1131 02/11/16 1041 02/21/16 1558 02/23/16 0604   ITP Comments Initial ITP created during medical review and evaluation. Diagnosis documentation found in [facility]ARE EVERY[facility] 12/20/2015 Hospital Summary 30 day review. Continue with ITP unless changes  noted by Medical Director at signature of review. New to program [name] called to let us know that he was in the hospital and diagnosed with cellulitis.  He was put on Augmentin and encouraged to get physician okay before returning to rehab. [name] called to let us know that he was cleared to come back, but he does not have a note to return.  We will send a note to Dr. Cleda Mccreedy for clearance.  [name] expressed interest in changing to 4 pm class, but we currently do not have any room in that class.  We will let [name] know when he can return. 30 day review completed for Medical Director physician review and signature. Continue ITP unless changes made by physician.   [place] Name 03/02/16 1503 03/14/16 1547 03/22/16 0648       ITP Comments [name] has been cleared to return to rehab this week, but has yet to return.  No visits since last review. [name] called and left a message stating that he was in the hospital for cellulitis.  He is to follow up as outpatient.  We will wait for clearance to return to rehab. 30 day review completed for review by Dr Emily Filbert.  Continue with ITP unless changes noted by Dr Sabra Heck. HAs been out, waiting for clearance to return        Comments:

## 2016-03-23 DIAGNOSIS — L03032 Cellulitis of left toe: Secondary | ICD-10-CM | POA: Diagnosis not present

## 2016-03-23 DIAGNOSIS — Z794 Long term (current) use of insulin: Secondary | ICD-10-CM | POA: Diagnosis not present

## 2016-03-23 DIAGNOSIS — L97521 Non-pressure chronic ulcer of other part of left foot limited to breakdown of skin: Secondary | ICD-10-CM | POA: Diagnosis not present

## 2016-03-23 DIAGNOSIS — L97511 Non-pressure chronic ulcer of other part of right foot limited to breakdown of skin: Secondary | ICD-10-CM | POA: Diagnosis not present

## 2016-03-23 DIAGNOSIS — E1142 Type 2 diabetes mellitus with diabetic polyneuropathy: Secondary | ICD-10-CM | POA: Diagnosis not present

## 2016-03-29 ENCOUNTER — Telehealth: Payer: Self-pay | Admitting: *Deleted

## 2016-03-29 ENCOUNTER — Encounter: Payer: Self-pay | Admitting: *Deleted

## 2016-03-29 DIAGNOSIS — I214 Non-ST elevation (NSTEMI) myocardial infarction: Secondary | ICD-10-CM

## 2016-03-29 NOTE — Telephone Encounter (Signed)
Called to check on status of return.  LMOM cell phone.  Needs clearance to return from cellulitis on his foot.

## 2016-03-30 ENCOUNTER — Ambulatory Visit (INDEPENDENT_AMBULATORY_CARE_PROVIDER_SITE_OTHER): Payer: Medicare Other | Admitting: Vascular Surgery

## 2016-03-30 DIAGNOSIS — L03116 Cellulitis of left lower limb: Secondary | ICD-10-CM | POA: Diagnosis not present

## 2016-03-30 DIAGNOSIS — Z794 Long term (current) use of insulin: Secondary | ICD-10-CM | POA: Diagnosis not present

## 2016-03-30 DIAGNOSIS — I25119 Atherosclerotic heart disease of native coronary artery with unspecified angina pectoris: Secondary | ICD-10-CM

## 2016-03-30 DIAGNOSIS — E1159 Type 2 diabetes mellitus with other circulatory complications: Secondary | ICD-10-CM | POA: Diagnosis not present

## 2016-03-30 DIAGNOSIS — I890 Lymphedema, not elsewhere classified: Secondary | ICD-10-CM | POA: Diagnosis not present

## 2016-03-30 NOTE — Progress Notes (Signed)
MRN : [id number]  Dennis Mullen is a 65 y.o. (09-14-1950) male who presents with chief complaint of  Chief Complaint  Patient presents with  . Re-evaluation    3 month follow up  .  History of Present Illness: The patient returns to the office for followup evaluation regarding leg swelling.  The swelling has persisted and the pain associated with swelling continues. There have been  interval development of an ulcerations of the  Left leg and he has been admitted once for IV antibiotics.  Since the previous visit the patient has been wearing graduated compression stockings and has noted little if any improvement in the lymphedema. The patient has been using compression routinely morning until night.  The patient also states elevation during the day and exercise is being done too.   Current Meds  Medication Sig  . allopurinol (ZYLOPRIM) 100 MG tablet Take 100 mg by mouth every morning.  Marland Kitchen amLODipine (NORVASC) 5 MG tablet Take 5 mg by mouth daily.   Marland Kitchen aspirin EC 81 MG tablet Take 81 mg by mouth every morning. Reported on 07/26/2015  . clopidogrel (PLAVIX) 75 MG tablet Take 75 mg by mouth every morning. Reported on 07/26/2015  . colchicine 0.6 MG tablet   . diclofenac (VOLTAREN) 75 MG EC tablet Take 75 mg by mouth 3 times/day as needed-between meals & bedtime.   . divalproex (DEPAKOTE ER) 500 MG 24 hr tablet Take 1-2 tablets (500-1,000 mg total) by mouth 2 (two) times daily. Take 515m in morning and 10053min evening  . DULoxetine (CYMBALTA) 60 MG capsule Take 1 capsule (60 mg total) by mouth daily.  . Marland Kitchensomeprazole (NEXIUM) 40 MG capsule Take 40 mg by mouth daily.   . fenofibrate (TRICOR) 145 MG tablet Take by mouth.  . furosemide (LASIX) 40 MG tablet Take 1 tablet (40 mg total) by mouth daily.  . Marland Kitchenlucose 4 GM chewable tablet Chew 1 tablet by mouth as needed for low blood sugar.  . insulin aspart (NOVOLOG) 100 UNIT/ML injection Inject 12-20 Units into the skin 3 (three) times daily  with meals. Reported on 10/11/2015  . Insulin Disposable Pump (V-GO 30) KIT Reported on 06/29/2015  . isosorbide mononitrate (IMDUR) 30 MG 24 hr tablet Take 1 tablet (30 mg total) by mouth daily. (Patient taking differently: Take 60 mg by mouth daily. )  . levothyroxine (SYNTHROID, LEVOTHROID) 50 MCG tablet Take 50 mcg by mouth daily before breakfast.  . metFORMIN (GLUCOPHAGE) 1000 MG tablet Take 1,000 mg by mouth 2 (two) times daily with a meal.  . metoprolol succinate (TOPROL-XL) 50 MG 24 hr tablet Take 50 mg by mouth daily.   . nitroGLYCERIN (NITROSTAT) 0.4 MG SL tablet Place 0.4 mg under the tongue every 5 (five) minutes x 3 doses as needed for chest pain.   . pregabalin (LYRICA) 100 MG capsule Take 100 mg by mouth 3 (three) times daily.  . Marland KitchenOPINIRole (REQUIP) 1 MG tablet Take 1 mg by mouth 2 (two) times daily.   . rosuvastatin (CRESTOR) 20 MG tablet Take 20 mg by mouth daily.  . Marland Kitchenulfamethoxazole-trimethoprim (BACTRIM DS,SEPTRA DS) 800-160 MG tablet Take 1 tablet by mouth every 12 (twelve) hours.  . Marland KitchenICTOZA 18 MG/3ML SOPN Inject 1.8 mg into the skin daily.   . vitamin C (ASCORBIC ACID) 500 MG tablet Take 500 mg by mouth daily.    Past Medical History:  Diagnosis Date  . Abnormal levels of other serum enzymes   . Anxiety   .  Bipolar disorder ([place])   . BPH (benign prostatic hyperplasia)   . CAD (coronary artery disease)   . CHF (congestive heart failure) ([place])    ?  Marland Kitchen Depression   . Diabetes mellitus, type II ([place])   . Diabetes mellitus, type II, insulin dependent ([place])   . ED (erectile dysfunction)   . GERD (gastroesophageal reflux disease)   . Gout   . History of borderline personality disorder   . Hypertension   . Hypogonadism in male   . Insomnia   . Mood disorder ([place])   . Neuropathy ([place])   . Obesity   . OSA (obstructive sleep apnea)   . PVD (peripheral vascular disease) ([place])   . Thyroid disease     Past Surgical History:  Procedure Laterality Date  . CARDIAC  CATHETERIZATION N/A 10/12/2015   Procedure: Left Heart Cath and Coronary Angiography;  Surgeon: Teodoro Spray, MD;  Location: [facility];  Service: Cardiovascular;  Laterality: N/A;  . CORONARY ANGIOPLASTY WITH STENT PLACEMENT     x5 stents  . FOOT SURGERY    . MASTOIDECTOMY Right   . NASAL SEPTUM SURGERY    . penial inplant    . PENILE PROSTHESIS IMPLANT    . right mastoidectomy    . SCROTOPLASTY    . TONSILLECTOMY AND ADENOIDECTOMY      Social History Social History  Substance Use Topics  . Smoking status: Never Smoker  . Smokeless tobacco: Never Used  . Alcohol use No    Family History Family History  Problem Relation Age of Onset  . Lung cancer Mother   . Heart attack Father   . Post-traumatic stress disorder Father   . Anxiety disorder Father   . Depression Father   . Heart attack Sister   . Heart attack Brother   No family history of bleeding/clotting disorders, porphyria or autoimmune disease   No Known Allergies   REVIEW OF SYSTEMS (Negative unless checked)  Constitutional: [] Weight loss  [] Fever  [] Chills Cardiac: [] Chest pain   [] Chest pressure   [] Palpitations   [] Shortness of breath when laying flat   [] Shortness of breath with exertion. Vascular:  [] Pain in legs with walking   [] Pain in legs at rest  [] History of DVT   [] Phlebitis   [x] Swelling in legs   [] Varicose veins   [x] Non-healing ulcers Pulmonary:   [] Uses home oxygen   [] Productive cough   [] Hemoptysis   [] Wheeze  [] COPD   [] Asthma Neurologic:  [] Dizziness   [] Seizures   [] History of stroke   [] History of TIA  [] Aphasia   [] Vissual changes   [] Weakness or numbness in arm   [] Weakness or numbness in leg Musculoskeletal:   [] Joint swelling   [x] Joint pain   [] Low back pain Hematologic:  [] Easy bruising  [] Easy bleeding   [] Hypercoagulable state   [] Anemic Gastrointestinal:  [] Diarrhea   [] Vomiting  [] Gastroesophageal reflux/heartburn   [] Difficulty swallowing. Genitourinary:  [] Chronic  kidney disease   [] Difficult urination  [] Frequent urination   [] Blood in urine Skin:  [] Rashes   [] Ulcers  Psychological:  [] History of anxiety   []  History of major depression.  Physical Examination  There were no vitals filed for this visit. There is no height or weight on file to calculate BMI. Gen: WD/WN, NAD Head: [place]/AT, No temporalis wasting.  Ear/Nose/Throat: Hearing grossly intact, nares w/o erythema or drainage, poor dentition Eyes: PER, EOMI, sclera nonicteric.  Neck: Supple, no masses.  No bruit or JVD.  Pulmonary:  Good air movement, clear to auscultation bilaterally, no use of accessory muscles.  Cardiac: RRR, normal S1, S2, no Murmurs. Vascular:  3+ lymphedema with marked skin changes, ulcer present left ankle is noninfected Vessel Right Left  Radial Palpable Palpable  Ulnar Palpable Palpable  Brachial Palpable Palpable  Carotid Palpable Palpable  Femoral Palpable Palpable  Popliteal Palpable Palpable  PT Palpable Palpable  DP Palpable Palpable   Gastrointestinal: soft, non-distended. No guarding/no peritoneal signs.  Musculoskeletal: M/S 5/5 throughout.  No deformity or atrophy.  Neurologic: CN 2-12 intact. Pain and light touch intact in extremities.  Symmetrical.  Speech is fluent. Motor exam as listed above. Psychiatric: Judgment intact, Mood & affect appropriate for pt's clinical situation. Dermatologic: No rashes or ulcers noted.  No changes consistent with cellulitis. Lymph : No Cervical lymphadenopathy, no lichenification or skin changes of chronic lymphedema.  CBC Lab Results  Component Value Date   WBC 7.2 03/13/2016   HGB 11.2 (L) 03/13/2016   HCT 33.7 (L) 03/13/2016   MCV 83.5 03/13/2016   PLT 324 03/13/2016    BMET    Component Value Date/Time   NA 138 03/13/2016 0621   NA 139 01/26/2014 0503   K 4.6 03/13/2016 0621   K 4.7 01/26/2014 0503   CL 104 03/13/2016 0621   CL 103 01/26/2014 0503   CO2 27 03/13/2016 0621   CO2 29 01/26/2014 0503    GLUCOSE 98 03/13/2016 0621   GLUCOSE 123 (H) 01/26/2014 0503   BUN 25 (H) 03/13/2016 0621   BUN 20 (H) 01/26/2014 0503   CREATININE 1.20 03/13/2016 0621   CREATININE 1.36 (H) 01/26/2014 0503   CALCIUM 8.8 (L) 03/13/2016 0621   CALCIUM 8.7 01/26/2014 0503   GFRNONAA >60 03/13/2016 0621   GFRNONAA 57 (L) 01/26/2014 0503   GFRNONAA >60 11/28/2013 1002   GFRAA >60 03/13/2016 0621   GFRAA >60 01/26/2014 0503   GFRAA >60 11/28/2013 1002   CrCl cannot be calculated (Unknown ideal weight.).  COAG Lab Results  Component Value Date   INR 1.07 12/20/2015   INR 1.20 10/12/2015   INR 1.1 08/14/2013    Radiology Dg Chest 1 View  Result Date: 03/12/2016 CLINICAL DATA:  Shortness of breath EXAM: CHEST 1 VIEW COMPARISON:  12/20/2015 FINDINGS: AP portable semi-erect view of the chest demonstrates mild cardiomegaly. There is central vascular congestion and diffuse interstitial opacities, consistent with pulmonary edema. No large effusion. No pneumothorax. IMPRESSION: Cardiomegaly with central vascular congestion and mild diffuse interstitial pulmonary edema. Electronically Signed   By: Donavan Foil M.D.   On: 03/12/2016 22:05   US Venous Img Lower Unilateral Left  Result Date: 03/12/2016 CLINICAL DATA:  65 year old male with left lower extremity swelling for the past week EXAM: LEFT LOWER EXTREMITY VENOUS DOPPLER ULTRASOUND TECHNIQUE: Gray-scale sonography with graded compression, as well as color Doppler and duplex ultrasound were performed to evaluate the lower extremity deep venous systems from the level of the common femoral vein and including the common femoral, femoral, profunda femoral, popliteal and calf veins including the posterior tibial, peroneal and gastrocnemius veins when visible. The superficial great saphenous vein was also interrogated. Spectral Doppler was utilized to evaluate flow at rest and with distal augmentation maneuvers in the common femoral, femoral and popliteal veins.  COMPARISON:  None. FINDINGS: Contralateral Common Femoral Vein: Respiratory phasicity is normal and symmetric with the symptomatic side. No evidence of thrombus. Normal compressibility. Common Femoral Vein: No evidence of thrombus. Normal compressibility, respiratory phasicity and response to augmentation.  Saphenofemoral Junction: No evidence of thrombus. Normal compressibility and flow on color Doppler imaging. Profunda Femoral Vein: No evidence of thrombus. Normal compressibility and flow on color Doppler imaging. Femoral Vein: No evidence of thrombus. Normal compressibility, respiratory phasicity and response to augmentation. Popliteal Vein: No evidence of thrombus. Normal compressibility, respiratory phasicity and response to augmentation. Calf Veins: No evidence of thrombus. Normal compressibility and flow on color Doppler imaging. Superficial Great Saphenous Vein: No evidence of thrombus. Normal compressibility and flow on color Doppler imaging. Venous Reflux:  None. Other Findings:  None. IMPRESSION: No evidence of deep venous thrombosis. Electronically Signed   By: Jacqulynn Cadet M.D.   On: 03/12/2016 14:38    Assessment/Plan 1.  Lymphedema Recommend:  No surgery or intervention at this point in time.    I have reviewed my previous discussion with the patient regarding swelling and why it causes symptoms.  Patient will continue wearing graduated compression stockings class 1 (20-30 mmHg) on a daily basis. The patient will  beginning wearing the stockings first thing in the morning and removing them in the evening. The patient is instructed specifically not to sleep in the stockings.    In addition, behavioral modification including several periods of elevation of the lower extremities during the day will be continued.  This was reviewed with the patient during the initial visit.  The patient will also continue routine exercise, especially walking on a daily basis as was discussed during the  initial visit.    Since graduated compression therapy and behavioral modification has not yielded adequate control of the lymphedema I believe that a lymph pump should be added to improve the control of the patient's lymphedema.  Additionally, a lymph pump is warranted because it will reduce the risk of cellulitis and ulceration in the future.  Patient should follow-up in six months     2. Left leg cellulitis Essentially resolved, complete the antibiotics as Rx   3. Type 2 diabetes mellitus with other circulatory complication, with long-term current use of insulin (HCC) Continue hypoglycemic medications as already ordered and reviewed, no changes at this time.  4. Coronary artery disease involving native coronary artery of native heart with angina pectoris ([place]) Continue antihypertensive and cardiac medications as already ordered and reviewed, no changes at this time.  Continue statin as ordered and reviewed, no changes at this time    Hortencia Pilar, MD  03/30/2016 5:43 PM

## 2016-04-06 DIAGNOSIS — L97522 Non-pressure chronic ulcer of other part of left foot with fat layer exposed: Secondary | ICD-10-CM | POA: Diagnosis not present

## 2016-04-07 DIAGNOSIS — E039 Hypothyroidism, unspecified: Secondary | ICD-10-CM | POA: Diagnosis not present

## 2016-04-07 DIAGNOSIS — I10 Essential (primary) hypertension: Secondary | ICD-10-CM | POA: Diagnosis not present

## 2016-04-07 DIAGNOSIS — M79672 Pain in left foot: Secondary | ICD-10-CM | POA: Diagnosis not present

## 2016-04-07 DIAGNOSIS — E1122 Type 2 diabetes mellitus with diabetic chronic kidney disease: Secondary | ICD-10-CM | POA: Diagnosis not present

## 2016-04-18 ENCOUNTER — Telehealth: Payer: Self-pay | Admitting: *Deleted

## 2016-04-18 ENCOUNTER — Encounter: Payer: Self-pay | Admitting: *Deleted

## 2016-04-18 DIAGNOSIS — I214 Non-ST elevation (NSTEMI) myocardial infarction: Secondary | ICD-10-CM

## 2016-04-18 NOTE — Progress Notes (Signed)
Cardiac Individual Treatment Plan  Patient Details  Name: Dennis Mullen MRN: [id number] Date of Birth: [birth year] Referring Provider:   Flowsheet Row Cardiac Rehab from 01/13/2016 in [facility] Cardiac and Pulmonary Rehab  Referring Provider  [name]      Initial Encounter Date:  Flowsheet Row Cardiac Rehab from 01/13/2016 in [facility] Cardiac and Pulmonary Rehab  Date  01/13/16  Referring Provider  [name]      Visit Diagnosis: NSTEMI (non-ST elevated myocardial infarction) [facility])  Patient's Home Medications on Admission:  Current Outpatient Prescriptions:  .  allopurinol (ZYLOPRIM) 100 MG tablet, Take 100 mg by mouth every morning., Disp: , Rfl:  .  amLODipine (NORVASC) 5 MG tablet, Take 5 mg by mouth daily. , Disp: , Rfl:  .  aspirin EC 81 MG tablet, Take 81 mg by mouth every morning. Reported on 07/26/2015, Disp: , Rfl:  .  clopidogrel (PLAVIX) 75 MG tablet, Take 75 mg by mouth every morning. Reported on 07/26/2015, Disp: , Rfl:  .  colchicine 0.6 MG tablet, , Disp: , Rfl:  .  diclofenac (VOLTAREN) 75 MG EC tablet, Take 75 mg by mouth 3 times/day as needed-between meals & bedtime. , Disp: , Rfl:  .  divalproex (DEPAKOTE ER) 500 MG 24 hr tablet, Take 1-2 tablets (500-1,000 mg total) by mouth 2 (two) times daily. Take 533m in morning and 10017min evening, Disp: 90 tablet, Rfl: 3 .  DULoxetine (CYMBALTA) 60 MG capsule, Take 1 capsule (60 mg total) by mouth daily., Disp: 90 capsule, Rfl: 1 .  esomeprazole (NEXIUM) 40 MG capsule, Take 40 mg by mouth daily. , Disp: , Rfl:  .  fenofibrate (TRICOR) 145 MG tablet, Take by mouth., Disp: , Rfl:  .  furosemide (LASIX) 40 MG tablet, Take 1 tablet (40 mg total) by mouth daily., Disp: 30 tablet, Rfl: 0 .  glucose 4 GM chewable tablet, Chew 1 tablet by mouth as needed for low blood sugar., Disp: , Rfl:  .  insulin aspart (NOVOLOG) 100 UNIT/ML injection, Inject 12-20 Units into the skin 3 (three) times daily with meals. Reported on 10/11/2015, Disp: ,  Rfl:  .  Insulin Disposable Pump (V-GO 30) KIT, Reported on 06/29/2015, Disp: , Rfl:  .  isosorbide mononitrate (IMDUR) 30 MG 24 hr tablet, Take 1 tablet (30 mg total) by mouth daily. (Patient taking differently: Take 60 mg by mouth daily. ), Disp: 30 tablet, Rfl: 0 .  levothyroxine (SYNTHROID, LEVOTHROID) 50 MCG tablet, Take 50 mcg by mouth daily before breakfast., Disp: , Rfl:  .  metFORMIN (GLUCOPHAGE) 1000 MG tablet, Take 1,000 mg by mouth 2 (two) times daily with a meal., Disp: , Rfl:  .  metoprolol succinate (TOPROL-XL) 50 MG 24 hr tablet, Take 50 mg by mouth daily. , Disp: , Rfl:  .  nitroGLYCERIN (NITROSTAT) 0.4 MG SL tablet, Place 0.4 mg under the tongue every 5 (five) minutes x 3 doses as needed for chest pain. , Disp: , Rfl:  .  pregabalin (LYRICA) 100 MG capsule, Take 100 mg by mouth 3 (three) times daily., Disp: , Rfl:  .  rOPINIRole (REQUIP) 1 MG tablet, Take 1 mg by mouth 2 (two) times daily. , Disp: , Rfl:  .  rosuvastatin (CRESTOR) 20 MG tablet, Take 20 mg by mouth daily., Disp: , Rfl:  .  sulfamethoxazole-trimethoprim (BACTRIM DS,SEPTRA DS) 800-160 MG tablet, Take 1 tablet by mouth every 12 (twelve) hours., Disp: 20 tablet, Rfl: 0 .  VICTOZA 18 MG/3ML SOPN, Inject 1.8 mg  into the skin daily. , Disp: , Rfl:  .  vitamin C (ASCORBIC ACID) 500 MG tablet, Take 500 mg by mouth daily., Disp: , Rfl:   Past Medical History: Past Medical History:  Diagnosis Date  . Abnormal levels of other serum enzymes   . Anxiety   . Bipolar disorder ([place])   . BPH (benign prostatic hyperplasia)   . CAD (coronary artery disease)   . CHF (congestive heart failure) ([place])    ?  Marland Kitchen Depression   . Diabetes mellitus, type II ([place])   . Diabetes mellitus, type II, insulin dependent ([place])   . ED (erectile dysfunction)   . GERD (gastroesophageal reflux disease)   . Gout   . History of borderline personality disorder   . Hypertension   . Hypogonadism in male   . Insomnia   . Mood disorder ([place])   .  Neuropathy ([place])   . Obesity   . OSA (obstructive sleep apnea)   . PVD (peripheral vascular disease) ([place])   . Thyroid disease     Tobacco Use: History  Smoking Status  . Never Smoker  Smokeless Tobacco  . Never Used    Labs: Recent Review Flowsheet Data    Labs for ITP Cardiac and Pulmonary Rehab Latest Ref Rng & Units 01/29/2014 11/14/2014 11/14/2014 09/24/2015 10/11/2015   Cholestrol 0 - 200 mg/dL 223(H) - - 152 176   LDLCALC 0 - 99 mg/dL SEE COMMENT - - 46 89   HDL >40 mg/dL 34(L) - - 48 45   Trlycerides <150 mg/dL 474(H) - - 291(H) 211(H)   Hemoglobin A1c 4.0 - 6.0 % 8.4(H) 6.5(H) 6.4(H) 6.4(H) -       Exercise Target Goals:    Exercise Program Goal: Individual exercise prescription set with THRR, safety & activity barriers. Participant demonstrates ability to understand and report RPE using BORG scale, to self-measure pulse accurately, and to acknowledge the importance of the exercise prescription.  Exercise Prescription Goal: Starting with aerobic activity 30 plus minutes a day, 3 days per week for initial exercise prescription. Provide home exercise prescription and guidelines that participant acknowledges understanding prior to discharge.  Activity Barriers & Risk Stratification:     Activity Barriers & Cardiac Risk Stratification - 01/13/16 1811      Activity Barriers & Cardiac Risk Stratification   Comments Is scheduled for PFT.        6 Minute Walk:     6 Minute Walk    Row Name 01/13/16 1545         6 Minute Walk   Distance 1125 feet     Walk Time 6 minutes     # of Rest Breaks 0     MPH 2.13     METS 2.6     RPE 17     VO2 Peak 9.08     Symptoms No        Initial Exercise Prescription:     Initial Exercise Prescription - 01/13/16 1500      Date of Initial Exercise RX and Referring Provider   Date 01/13/16   Referring Provider Lacie Scotts   Level 2   Minutes 15   METs 2.5     Recumbant Bike   Level 2   RPM 60   Minutes  15   METs 2.5     NuStep   Level 3   Minutes 15   METs 2.5     Arm Ergometer  Level 2   Minutes 15   METs 2.5     REL-XR   Level 2   Minutes 15   METs 2.5     T5 Nustep   Level 2   Minutes 15   METs 2.5     Prescription Details   Frequency (times per week) 3   Duration Progress to 45 minutes of aerobic exercise without signs/symptoms of physical distress     Intensity   THRR 40-80% of Max Heartrate 105-139   Ratings of Perceived Exertion 11-13   Perceived Dyspnea 0-4     Progression   Progression Continue to progress workloads to maintain intensity without signs/symptoms of physical distress.     Resistance Training   Training Prescription Yes   Weight 3   Reps 10-15      Perform Capillary Blood Glucose checks as needed.  Exercise Prescription Changes:     Exercise Prescription Changes    Row Name 01/19/16 1400 01/21/16 0900 02/02/16 1500         Exercise Review   Progression Yes Yes Yes       Response to Exercise   Blood Pressure (Admit) 124/72  - 124/70     Blood Pressure (Exercise) 134/70  - 172/80     Blood Pressure (Exit) 126/64  - 132/70     Heart Rate (Admit) 87 bpm  - 79 bpm     Heart Rate (Exercise) 110 bpm  - 107 bpm     Heart Rate (Exit) 78 bpm  - 86 bpm     Rating of Perceived Exertion (Exercise) 14  - 14     Symptoms SOB on treadmill SOB on treadmill none     Comments  - Home Exercise Guidelines given 01/21/16 Home Exercise Guidelines given 01/21/16     Duration Progress to 45 minutes of aerobic exercise without signs/symptoms of physical distress Progress to 45 minutes of aerobic exercise without signs/symptoms of physical distress Progress to 45 minutes of aerobic exercise without signs/symptoms of physical distress     Intensity THRR unchanged THRR unchanged THRR unchanged       Progression   Progression Continue to progress workloads to maintain intensity without signs/symptoms of physical distress. Continue to progress workloads to  maintain intensity without signs/symptoms of physical distress. Continue to progress workloads to maintain intensity without signs/symptoms of physical distress.     Average METs 1.87 1.87 3.5       Resistance Training   Training Prescription Yes Yes Yes     Weight 4 lbs 4 lbs 4 lbs     Reps 10-12 10-12 10-12       Interval Training   Interval Training No No No       Treadmill   MPH 0.8 0.8  -     Grade 0 0  -     Minutes 15 15  -     METs 1.6 1.6  -       Bike   Level  -  - 1.5     Minutes  -  - 15     METs  -  - 2       Cybex   Level  - 4  -     RPM  - 50  -     Minutes  - 15  -       T5 Nustep   Level _0 Minutes 15 15 15  METs  -  - 2       Biostep-RELP   Level _0 Watts -  50 spm -  50 spm  -     Minutes _1 METs _2 Home Exercise Plan   Plans to continue exercise at  [facility] (comment)  walking and [employer] (comment)  walking and [employer] ([employer])     Frequency  - Add 2 additional days to program exercise sessions. Add 2 additional days to program exercise sessions.        Exercise Comments:     Exercise Comments    Row Name 01/17/16 1020 01/19/16 1448 01/21/16 0926 02/02/16 1529 02/17/16 1020   Exercise Comments First full day of exercise!  Patient was oriented to gym and equipment including functions, settings, policies, and procedures.  Patient's individual exercise prescription and treatment plan were reviewed.  All starting workloads were established based on the results of the 6 minute walk test done at initial orientation visit.  The plan for exercise progression was also introduced and progression will be customized based on patient's performance and goals. [name] is off to a good start with exercise.  His blood sugars have been low at the beginning of exercise, but he is able to get high enough to exercise after taking glucose tablets.  Today, he was low again  after exercise. (64)  He was encouraged to contact his endocrinolgist about holding his insulin until after exercise.  We will follow up on  Friday.  We will continue to monitor for progression. Switched out treadmill for bike today.  [name] liked the bike much better. [name] is doing well with exercise.  He likes the [employer] bike for exercise, says he likes the breeze!  We will continue to monitor his progression. [name] has been out with cellulitis in his foot.  Waiting for clearance to return.      Discharge Exercise Prescription (Final Exercise Prescription Changes):     Exercise Prescription Changes - 02/02/16 1500      Exercise Review   Progression Yes     Response to Exercise   Blood Pressure (Admit) 124/70   Blood Pressure (Exercise) 172/80   Blood Pressure (Exit) 132/70   Heart Rate (Admit) 79 bpm   Heart Rate (Exercise) 107 bpm   Heart Rate (Exit) 86 bpm   Rating of Perceived Exertion (Exercise) 14   Symptoms none   Comments Home Exercise Guidelines given 01/21/16   Duration Progress to 45 minutes of aerobic exercise without signs/symptoms of physical distress   Intensity THRR unchanged     Progression   Progression Continue to progress workloads to maintain intensity without signs/symptoms of physical distress.   Average METs 3.5     Resistance Training   Training Prescription Yes   Weight 4 lbs   Reps 10-12     Interval Training   Interval Training No     Bike   Level 1.5   Minutes 15   METs 2     T5 Nustep   Level 3   Minutes 15   METs 2     Biostep-RELP   Level 3   Minutes 15   METs 3     Home Exercise Plan   Plans to continue exercise at [employer] (comment)  walking and [employer])   Frequency  Add 2 additional days to program exercise sessions.      Nutrition:  Target Goals: Understanding of nutrition guidelines, daily intake of sodium <1565m, cholesterol <2033m calories 30% from fat and 7% or less from saturated fats, daily  to have 5 or more servings of fruits and vegetables.  Biometrics:     Pre Biometrics - 01/13/16 1544      Pre Biometrics   Height 6' 1.25" (1.861 m)   Weight 288 lb 4.8 oz (130.8 kg)   Waist Circumference 50.75 inches   Hip Circumference 49 inches   Waist to Hip Ratio 1.04 %   BMI (Calculated) 37.9       Nutrition Therapy Plan and Nutrition Goals:     Nutrition Therapy & Goals - 01/13/16 1803      Intervention Plan   Intervention Prescribe, educate and counsel regarding individualized specific dietary modifications aiming towards targeted core components such as weight, hypertension, lipid management, diabetes, heart failure and other comorbidities.   Expected Outcomes Short Term Goal: Understand basic principles of dietary content, such as calories, fat, sodium, cholesterol and nutrients.;Short Term Goal: A plan has been developed with personal nutrition goals set during dietitian appointment.;Long Term Goal: Adherence to prescribed nutrition plan.      Nutrition Discharge: Rate Your Plate Scores:     Nutrition Assessments - 01/13/16 1803      Rate Your Plate Scores   Pre Score 71   Pre Score % 79 %      Nutrition Goals Re-Evaluation:   Psychosocial: Target Goals: Acknowledge presence or absence of depression, maximize coping skills, provide positive support system. Participant is able to verbalize types and ability to use techniques and skills needed for reducing stress and depression.  Initial Review & Psychosocial Screening:     Initial Psych Review & Screening - 01/13/16 1807      Initial Review   Current issues with Current Depression;Current Psychotropic Meds;Current Stress Concerns   Source of Stress Concerns Financial;Unable to participate in former interests or hobbies  Symptoms had kept from being active.. Hopes that getting back to exercise routine will help resolve this concern     Family Dynamics   Good Support System? Yes  Children and friends      Barriers   Psychosocial barriers to participate in program There are no identifiable barriers or psychosocial needs.;The patient should benefit from training in stress management and relaxation.     Screening Interventions   Interventions Encouraged to exercise      Quality of Life Scores:     Quality of Life - 01/13/16 1808      Quality of Life Scores   Health/Function Pre 20.53 %   Socioeconomic Pre 19.57 %   Psych/Spiritual Pre 20.71 %   Family Pre 21 %   GLOBAL Pre 20.42 %      PHQ-9: Recent Review Flowsheet Data    [facility] 01/13/2016 07/30/2014   Decreased Interest 0  0   Down, Depressed, Hopeless 0 0   PHQ - 2 Score 0 0   Altered sleeping 3 -   Tired, decreased energy 3 -   Change in appetite 3 -   Feeling bad or failure about yourself  1 -   Trouble concentrating 1 -   Moving slowly or fidgety/restless 1 -   Suicidal thoughts 0 -   PHQ-9 Score 12 -      Psychosocial Evaluation and Intervention:     Psychosocial Evaluation - 01/24/16  1013      Psychosocial Evaluation & Interventions   Interventions Encouraged to exercise with the program and follow exercise prescription;Stress management education   Comments Counselor met with Mr. Koloski [name]) today for initial psychosocial evaluation.  [name] is a 66 year old who has multiple health issues including Cardiac; Diabetes Type 2 and Bipolar Disorder.  He has (2) Adult Children and their families who live close by and [name] is also a part of a church community.  He also has sleep apnea and uses a CPAP nightly but [name] reports he may only sleep approx. 6 hours per night 3-4 nights per week and otherwise does not sleep well at all.  He states his Dr. has prescribed Ambien PRN to help with this, but [name] "prefers not to take additional medications."  His appetite is up and down with his endocrinologist reportedly calling him a "carbaphobic."  [name] was diagnosed with Bi-Polar Disorder shortly after his spouse  left him in 2007 and attempted suicide 5X afterwards.  The latest attempt was reported to be 4-5 years ago.  He states that his current medications keep his mood stable for the most part.  He stated he is somewhat anxious and his mood is above average currently.  His stressors consist of his health, finances and some stress in relationships in his life.  He has goals to increase his stamina and strength and to exercise consistently while in this program.  Counselor encouraged [name] to take his medication for sleep as needed per Dr. recommendations.  He also would benefit from a Divorce Care support group to help with the unresolved grief from his wife leaving him almost 10 years ago.  Counselor shared this information with [name] and will be following with him throughout the course of this program.     Continued Psychosocial Services Needed Yes  Follow re: sleep and support group.  He will also benefit from the psychoeducational components of this program - especially stress management.        Psychosocial Re-Evaluation:   Vocational Rehabilitation: Provide vocational rehab assistance to qualifying candidates.   Vocational Rehab Evaluation & Intervention:     Vocational Rehab - 01/13/16 1811      Initial Vocational Rehab Evaluation & Intervention   Assessment shows need for Vocational Rehabilitation No      Education: Education Goals: Education classes will be provided on a weekly basis, covering required topics. Participant will state understanding/return demonstration of topics presented.  Learning Barriers/Preferences:     Learning Barriers/Preferences - 01/13/16 1811      Learning Barriers/Preferences   Learning Barriers None   Learning Preferences None      Education Topics: General Nutrition Guidelines/Fats and Fiber: -Group instruction provided by verbal, written material, models and posters to present the general guidelines for heart healthy nutrition. Gives an explanation  and review of dietary fats and fiber.   Controlling Sodium/Reading Food Labels: -Group verbal and written material supporting the discussion of sodium use in heart healthy nutrition. Review and explanation with models, verbal and written materials for utilization of the food label.   Exercise Physiology & Risk Factors: - Group verbal and written instruction with models to review the exercise physiology of the cardiovascular system and associated critical values. Details cardiovascular disease risk factors and the goals associated with each risk factor. Flowsheet Row Cardiac Rehab from 01/31/2016 in [facility] Cardiac and Pulmonary Rehab  Date  01/17/16  Educator  [facility]  Instruction Review Code  2- meets goals/outcomes  Aerobic Exercise & Resistance Training: - Gives group verbal and written discussion on the health impact of inactivity. On the components of aerobic and resistive training programs and the benefits of this training and how to safely progress through these programs. Flowsheet Row Cardiac Rehab from 01/31/2016 in [facility] Cardiac and Pulmonary Rehab  Date  01/19/16  Educator  [facility]  Instruction Review Code  2- meets goals/outcomes      Flexibility, Balance, General Exercise Guidelines: - Provides group verbal and written instruction on the benefits of flexibility and balance training programs. Provides general exercise guidelines with specific guidelines to those with heart or lung disease. Demonstration and skill practice provided. Flowsheet Row Cardiac Rehab from 01/31/2016 in [facility] Cardiac and Pulmonary Rehab  Date  01/24/16  Educator  [facility]  Instruction Review Code  2- meets goals/outcomes      Stress Management: - Provides group verbal and written instruction about the health risks of elevated stress, cause of high stress, and healthy ways to reduce stress.   Depression: - Provides group verbal and written instruction on the correlation between heart/lung disease and  depressed mood, treatment options, and the stigmas associated with seeking treatment.   Anatomy & Physiology of the Heart: - Group verbal and written instruction and models provide basic cardiac anatomy and physiology, with the coronary electrical and arterial systems. Review of: AMI, Angina, Valve disease, Heart Failure, Cardiac Arrhythmia, Pacemakers, and the ICD. Flowsheet Row Cardiac Rehab from 01/31/2016 in [facility] Cardiac and Pulmonary Rehab  Date  01/31/16  Educator  CE  Instruction Review Code  2- meets goals/outcomes      Cardiac Procedures: - Group verbal and written instruction and models to describe the testing methods done to diagnose heart disease. Reviews the outcomes of the test results. Describes the treatment choices: Medical Management, Angioplasty, or Coronary Bypass Surgery.   Cardiac Medications: - Group verbal and written instruction to review commonly prescribed medications for heart disease. Reviews the medication, class of the drug, and side effects. Includes the steps to properly store meds and maintain the prescription regimen.   Go Sex-Intimacy & Heart Disease, Get SMART - Goal Setting: - Group verbal and written instruction through game format to discuss heart disease and the return to sexual intimacy. Provides group verbal and written material to discuss and apply goal setting through the application of the S.M.A.R.T. Method.   Other Matters of the Heart: - Provides group verbal, written materials and models to describe Heart Failure, Angina, Valve Disease, and Diabetes in the realm of heart disease. Includes description of the disease process and treatment options available to the cardiac patient. Flowsheet Row Cardiac Rehab from 01/31/2016 in [facility] Cardiac and Pulmonary Rehab  Date  01/31/16  Educator  CE  Instruction Review Code  2- meets goals/outcomes      Exercise & Equipment Safety: - Individual verbal instruction and demonstration of equipment use  and safety with use of the equipment. Flowsheet Row Cardiac Rehab from 01/31/2016 in [facility] Cardiac and Pulmonary Rehab  Date  01/13/16  Educator  SB  Instruction Review Code  2- meets goals/outcomes      Infection Prevention: - Provides verbal and written material to individual with discussion of infection control including proper hand washing and proper equipment cleaning during exercise session. Flowsheet Row Cardiac Rehab from 01/31/2016 in [facility] Cardiac and Pulmonary Rehab  Date  01/13/16  Educator  SB  Instruction Review Code  2- meets goals/outcomes      Falls Prevention: - Provides  verbal and written material to individual with discussion of falls prevention and safety. Flowsheet Row Cardiac Rehab from 01/31/2016 in [facility] Cardiac and Pulmonary Rehab  Date  01/13/16  Educator  SB  Instruction Review Code  2- meets goals/outcomes      Diabetes: - Individual verbal and written instruction to review signs/symptoms of diabetes, desired ranges of glucose level fasting, after meals and with exercise. Advice that pre and post exercise glucose checks will be done for 3 sessions at entry of program. Flowsheet Row Cardiac Rehab from 01/31/2016 in [facility] Cardiac and Pulmonary Rehab  Date  01/13/16  Educator  SB  Instruction Review Code  2- meets goals/outcomes       Knowledge Questionnaire Score:     Knowledge Questionnaire Score - 01/13/16 1811      Knowledge Questionnaire Score   Pre Score 25/28      Core Components/Risk Factors/Patient Goals at Admission:     Personal Goals and Risk Factors at Admission - 01/13/16 1804      Core Components/Risk Factors/Patient Goals on Admission    Weight Management Obesity;Weight Maintenance;Yes   Goal Weight: Long Term 260 lb (117.9 kg)   Expected Outcomes Short Term: Continue to assess and modify interventions until short term weight is achieved;Long Term: Adherence to nutrition and physical activity/exercise program aimed toward  attainment of established weight goal;Weight Loss: Understanding of general recommendations for a balanced deficit meal plan, which promotes 1-2 lb weight loss per week and includes a negative energy balance of [phone] kcal/d   Sedentary Yes   Intervention Provide advice, education, support and counseling about physical activity/exercise needs.;Develop an individualized exercise prescription for aerobic and resistive training based on initial evaluation findings, risk stratification, comorbidities and participant's personal goals.   Expected Outcomes Achievement of increased cardiorespiratory fitness and enhanced flexibility, muscular endurance and strength shown through measurements of functional capacity and personal statement of participant.   Diabetes Yes   Intervention Provide education about signs/symptoms and action to take for hypo/hyperglycemia.;Provide education about proper nutrition, including hydration, and aerobic/resistive exercise prescription along with prescribed medications to achieve blood glucose in normal ranges: Fasting glucose 65-99 mg/dL   Expected Outcomes Short Term: Participant verbalizes understanding of the signs/symptoms and immediate care of hyper/hypoglycemia, proper foot care and importance of medication, aerobic/resistive exercise and nutrition plan for blood glucose control.;Long Term: Attainment of HbA1C < 7%.  LAst A1c 7.7%,ususally 6% or under   Hypertension Yes   Intervention Provide education on lifestyle modifcations including regular physical activity/exercise, weight management, moderate sodium restriction and increased consumption of fresh fruit, vegetables, and low fat dairy, alcohol moderation, and smoking cessation.;Monitor prescription use compliance.   Expected Outcomes Short Term: Continued assessment and intervention until BP is < 140/52m HG in hypertensive participants. < 130/839mHG in hypertensive participants with diabetes, heart failure or chronic  kidney disease.;Long Term: Maintenance of blood pressure at goal levels.   Lipids Yes   Intervention Provide education and support for participant on nutrition & aerobic/resistive exercise along with prescribed medications to achieve LDL <7047mHDL >65m38m Expected Outcomes Short Term: Participant states understanding of desired cholesterol values and is compliant with medications prescribed. Participant is following exercise prescription and nutrition guidelines.;Long Term: Cholesterol controlled with medications as prescribed, with individualized exercise RX and with personalized nutrition plan. Value goals: LDL < 70mg58mL > 40 mg.   Stress --  Financial stress   Intervention Offer individual and/or small group education and counseling on adjustment to heart disease,  stress management and health-related lifestyle change. Teach and support self-help strategies.;Refer participants experiencing significant psychosocial distress to appropriate mental health specialists for further evaluation and treatment. When possible, include family members and significant others in education/counseling sessions.   Expected Outcomes Short Term: Participant demonstrates changes in health-related behavior, relaxation and other stress management skills, ability to obtain effective social support, and compliance with psychotropic medications if prescribed.;Long Term: Emotional wellbeing is indicated by absence of clinically significant psychosocial distress or social isolation.      Core Components/Risk Factors/Patient Goals Review:    Core Components/Risk Factors/Patient Goals at Discharge (Final Review):    ITP Comments:     ITP Comments    Row Name 01/13/16 1754 01/26/16 1131 02/11/16 1041 02/21/16 1558 02/23/16 0604   ITP Comments Initial ITP created during medical review and evaluation. Diagnosis documentation found in [facility] 12/20/2015 Hospital Summary 30 day review. Continue with ITP unless changes  noted by Medical Director at signature of review. New to program [name] called to let us know that he was in the hospital and diagnosed with cellulitis.  He was put on Augmentin and encouraged to get physician okay before returning to rehab. [name] called to let us know that he was cleared to come back, but he does not have a note to return.  We will send a note to Dr. Cleda Mccreedy for clearance.  [name] expressed interest in changing to 4 pm class, but we currently do not have any room in that class.  We will let [name] know when he can return. 30 day review completed for Medical Director physician review and signature. Continue ITP unless changes made by physician.   Row Name 03/02/16 1503 03/14/16 1547 03/22/16 0648 03/29/16 1510 04/18/16 0602   ITP Comments [name] has been cleared to return to rehab this week, but has yet to return.  No visits since last review. [name] called and left a message stating that he was in the hospital for cellulitis.  He is to follow up as outpatient.  We will wait for clearance to return to rehab. 30 day review completed for review by Dr Emily Filbert.  Continue with ITP unless changes noted by Dr Sabra Heck. HAs been out, waiting for clearance to return Called to check on status of return.  LMOM cell phone.  Needs clearance to return from cellulitis on his foot. 30 day review. Continue with ITP unless directed changes per Medical Director review. Remains out with medical concerns      Comments:

## 2016-04-18 NOTE — Telephone Encounter (Signed)
-----   Message from Katha Cabal, MD sent at 04/14/2016  2:47 PM EST ----- Regarding: RE: Clearance to return to Cardiac Rehab I think I will need to see him again.  When he was in the office his cellulitis was pretty much gone but I think some superficial wounds were still present ----- Message ----- From: Clotilde Dieter Sent: 04/11/2016  11:06 AM To: Katha Cabal, MD Subject: Clearance to return to Cardiac Rehab           Dr. Carroll Kinds,  Dennis Mullen is currently enrolled in Cardiac Rehab and has been out since October with his cellulitis.  He was cleared to return in November, but was then readmitted before he was able to return.  When do you feel he would be able to return to rehab?  He should not have any open wounds as this is a group exercise enviornment.  Thanks for your help and advice! Alberteen Sam, MA, ACSM RCEP 04/11/2016 11:09 AM

## 2016-04-27 DIAGNOSIS — L97511 Non-pressure chronic ulcer of other part of right foot limited to breakdown of skin: Secondary | ICD-10-CM | POA: Diagnosis not present

## 2016-04-27 DIAGNOSIS — L97522 Non-pressure chronic ulcer of other part of left foot with fat layer exposed: Secondary | ICD-10-CM | POA: Diagnosis not present

## 2016-04-28 ENCOUNTER — Telehealth (INDEPENDENT_AMBULATORY_CARE_PROVIDER_SITE_OTHER): Payer: Self-pay

## 2016-04-28 NOTE — Telephone Encounter (Signed)
Patient's insurance company called denying his lymphedema pump. [employer] handles all denials for the patients.

## 2016-05-01 ENCOUNTER — Ambulatory Visit: Payer: 59 | Admitting: Psychiatry

## 2016-05-02 ENCOUNTER — Telehealth: Payer: Self-pay | Admitting: *Deleted

## 2016-05-02 ENCOUNTER — Encounter: Payer: Self-pay | Admitting: *Deleted

## 2016-05-02 DIAGNOSIS — I214 Non-ST elevation (NSTEMI) myocardial infarction: Secondary | ICD-10-CM

## 2016-05-02 NOTE — Telephone Encounter (Signed)
-----   Message from Sharlotte Alamo, [place] sent at 05/02/2016  8:26 AM EST ----- Regarding: RE: Clearance to return to Cardiac Rehab Still has open ulcers, unsure about healing and return. ----- Message ----- From: Clotilde Dieter Sent: 04/11/2016  11:10 AM To: Sharlotte Alamo, DPM Subject: Clearance to return to Cardiac Rehab           Dr. Cleda Mccreedy,  Mr. Valtierra is currently enrolled in Cardiac Rehab and has been out since October with his cellulitis/foot ulcer.  He was cleared to return in November, but was then readmitted before he was able to return.  When do you feel he would be able to return to rehab?  He should not have any open wounds as this is a group exercise enviornment.   Thanks for your help and advice! Alberteen Sam, MA, ACSM RCEP 04/11/2016 11:11 AM

## 2016-05-02 NOTE — Progress Notes (Signed)
Cardiac Individual Treatment Plan  Patient Details  Name: Dennis Mullen MRN: [id number] Date of Birth: [birth year] Referring Provider:   Flowsheet Row Cardiac Rehab from 01/13/2016 in [facility] Cardiac and Pulmonary Rehab  Referring Provider  [name]      Initial Encounter Date:  Flowsheet Row Cardiac Rehab from 01/13/2016 in [facility] Cardiac and Pulmonary Rehab  Date  01/13/16  Referring Provider  [name]      Visit Diagnosis: NSTEMI (non-ST elevated myocardial infarction) [facility])  Patient's Home Medications on Admission:  Current Outpatient Prescriptions:  .  allopurinol (ZYLOPRIM) 100 MG tablet, Take 100 mg by mouth every morning., Disp: , Rfl:  .  amLODipine (NORVASC) 5 MG tablet, Take 5 mg by mouth daily. , Disp: , Rfl:  .  aspirin EC 81 MG tablet, Take 81 mg by mouth every morning. Reported on 07/26/2015, Disp: , Rfl:  .  clopidogrel (PLAVIX) 75 MG tablet, Take 75 mg by mouth every morning. Reported on 07/26/2015, Disp: , Rfl:  .  colchicine 0.6 MG tablet, , Disp: , Rfl:  .  diclofenac (VOLTAREN) 75 MG EC tablet, Take 75 mg by mouth 3 times/day as needed-between meals & bedtime. , Disp: , Rfl:  .  divalproex (DEPAKOTE ER) 500 MG 24 hr tablet, Take 1-2 tablets (500-1,000 mg total) by mouth 2 (two) times daily. Take 533m in morning and 10017min evening, Disp: 90 tablet, Rfl: 3 .  DULoxetine (CYMBALTA) 60 MG capsule, Take 1 capsule (60 mg total) by mouth daily., Disp: 90 capsule, Rfl: 1 .  esomeprazole (NEXIUM) 40 MG capsule, Take 40 mg by mouth daily. , Disp: , Rfl:  .  fenofibrate (TRICOR) 145 MG tablet, Take by mouth., Disp: , Rfl:  .  furosemide (LASIX) 40 MG tablet, Take 1 tablet (40 mg total) by mouth daily., Disp: 30 tablet, Rfl: 0 .  glucose 4 GM chewable tablet, Chew 1 tablet by mouth as needed for low blood sugar., Disp: , Rfl:  .  insulin aspart (NOVOLOG) 100 UNIT/ML injection, Inject 12-20 Units into the skin 3 (three) times daily with meals. Reported on 10/11/2015, Disp: ,  Rfl:  .  Insulin Disposable Pump (V-GO 30) KIT, Reported on 06/29/2015, Disp: , Rfl:  .  isosorbide mononitrate (IMDUR) 30 MG 24 hr tablet, Take 1 tablet (30 mg total) by mouth daily. (Patient taking differently: Take 60 mg by mouth daily. ), Disp: 30 tablet, Rfl: 0 .  levothyroxine (SYNTHROID, LEVOTHROID) 50 MCG tablet, Take 50 mcg by mouth daily before breakfast., Disp: , Rfl:  .  metFORMIN (GLUCOPHAGE) 1000 MG tablet, Take 1,000 mg by mouth 2 (two) times daily with a meal., Disp: , Rfl:  .  metoprolol succinate (TOPROL-XL) 50 MG 24 hr tablet, Take 50 mg by mouth daily. , Disp: , Rfl:  .  nitroGLYCERIN (NITROSTAT) 0.4 MG SL tablet, Place 0.4 mg under the tongue every 5 (five) minutes x 3 doses as needed for chest pain. , Disp: , Rfl:  .  pregabalin (LYRICA) 100 MG capsule, Take 100 mg by mouth 3 (three) times daily., Disp: , Rfl:  .  rOPINIRole (REQUIP) 1 MG tablet, Take 1 mg by mouth 2 (two) times daily. , Disp: , Rfl:  .  rosuvastatin (CRESTOR) 20 MG tablet, Take 20 mg by mouth daily., Disp: , Rfl:  .  sulfamethoxazole-trimethoprim (BACTRIM DS,SEPTRA DS) 800-160 MG tablet, Take 1 tablet by mouth every 12 (twelve) hours., Disp: 20 tablet, Rfl: 0 .  VICTOZA 18 MG/3ML SOPN, Inject 1.8 mg  into the skin daily. , Disp: , Rfl:  .  vitamin C (ASCORBIC ACID) 500 MG tablet, Take 500 mg by mouth daily., Disp: , Rfl:   Past Medical History: Past Medical History:  Diagnosis Date  . Abnormal levels of other serum enzymes   . Anxiety   . Bipolar disorder ([place])   . BPH (benign prostatic hyperplasia)   . CAD (coronary artery disease)   . CHF (congestive heart failure) ([place])    ?  Marland Kitchen Depression   . Diabetes mellitus, type II ([place])   . Diabetes mellitus, type II, insulin dependent ([place])   . ED (erectile dysfunction)   . GERD (gastroesophageal reflux disease)   . Gout   . History of borderline personality disorder   . Hypertension   . Hypogonadism in male   . Insomnia   . Mood disorder ([place])   .  Neuropathy ([place])   . Obesity   . OSA (obstructive sleep apnea)   . PVD (peripheral vascular disease) ([place])   . Thyroid disease     Tobacco Use: History  Smoking Status  . Never Smoker  Smokeless Tobacco  . Never Used    Labs: Recent Review Flowsheet Data    Labs for ITP Cardiac and Pulmonary Rehab Latest Ref Rng & Units 01/29/2014 11/14/2014 11/14/2014 09/24/2015 10/11/2015   Cholestrol 0 - 200 mg/dL 223(H) - - 152 176   LDLCALC 0 - 99 mg/dL SEE COMMENT - - 46 89   HDL >40 mg/dL 34(L) - - 48 45   Trlycerides <150 mg/dL 474(H) - - 291(H) 211(H)   Hemoglobin A1c 4.0 - 6.0 % 8.4(H) 6.5(H) 6.4(H) 6.4(H) -       Exercise Target Goals:    Exercise Program Goal: Individual exercise prescription set with THRR, safety & activity barriers. Participant demonstrates ability to understand and report RPE using BORG scale, to self-measure pulse accurately, and to acknowledge the importance of the exercise prescription.  Exercise Prescription Goal: Starting with aerobic activity 30 plus minutes a day, 3 days per week for initial exercise prescription. Provide home exercise prescription and guidelines that participant acknowledges understanding prior to discharge.  Activity Barriers & Risk Stratification:     Activity Barriers & Cardiac Risk Stratification - 01/13/16 1811      Activity Barriers & Cardiac Risk Stratification   Comments Is scheduled for PFT.        6 Minute Walk:     6 Minute Walk    Row Name 01/13/16 1545         6 Minute Walk   Distance 1125 feet     Walk Time 6 minutes     # of Rest Breaks 0     MPH 2.13     METS 2.6     RPE 17     VO2 Peak 9.08     Symptoms No        Initial Exercise Prescription:     Initial Exercise Prescription - 01/13/16 1500      Date of Initial Exercise RX and Referring Provider   Date 01/13/16   Referring Provider Lacie Scotts   Level 2   Minutes 15   METs 2.5     Recumbant Bike   Level 2   RPM 60   Minutes  15   METs 2.5     NuStep   Level 3   Minutes 15   METs 2.5     Arm Ergometer  Level 2   Minutes 15   METs 2.5     REL-XR   Level 2   Minutes 15   METs 2.5     T5 Nustep   Level 2   Minutes 15   METs 2.5     Prescription Details   Frequency (times per week) 3   Duration Progress to 45 minutes of aerobic exercise without signs/symptoms of physical distress     Intensity   THRR 40-80% of Max Heartrate 105-139   Ratings of Perceived Exertion 11-13   Perceived Dyspnea 0-4     Progression   Progression Continue to progress workloads to maintain intensity without signs/symptoms of physical distress.     Resistance Training   Training Prescription Yes   Weight 3   Reps 10-15      Perform Capillary Blood Glucose checks as needed.  Exercise Prescription Changes:     Exercise Prescription Changes    Row Name 01/19/16 1400 01/21/16 0900 02/02/16 1500         Exercise Review   Progression Yes Yes Yes       Response to Exercise   Blood Pressure (Admit) 124/72  - 124/70     Blood Pressure (Exercise) 134/70  - 172/80     Blood Pressure (Exit) 126/64  - 132/70     Heart Rate (Admit) 87 bpm  - 79 bpm     Heart Rate (Exercise) 110 bpm  - 107 bpm     Heart Rate (Exit) 78 bpm  - 86 bpm     Rating of Perceived Exertion (Exercise) 14  - 14     Symptoms SOB on treadmill SOB on treadmill none     Comments  - Home Exercise Guidelines given 01/21/16 Home Exercise Guidelines given 01/21/16     Duration Progress to 45 minutes of aerobic exercise without signs/symptoms of physical distress Progress to 45 minutes of aerobic exercise without signs/symptoms of physical distress Progress to 45 minutes of aerobic exercise without signs/symptoms of physical distress     Intensity THRR unchanged THRR unchanged THRR unchanged       Progression   Progression Continue to progress workloads to maintain intensity without signs/symptoms of physical distress. Continue to progress workloads to  maintain intensity without signs/symptoms of physical distress. Continue to progress workloads to maintain intensity without signs/symptoms of physical distress.     Average METs 1.87 1.87 3.5       Resistance Training   Training Prescription Yes Yes Yes     Weight 4 lbs 4 lbs 4 lbs     Reps 10-12 10-12 10-12       Interval Training   Interval Training No No No       Treadmill   MPH 0.8 0.8  -     Grade 0 0  -     Minutes 15 15  -     METs 1.6 1.6  -       Bike   Level  -  - 1.5     Minutes  -  - 15     METs  -  - 2       Cybex   Level  - 4  -     RPM  - 50  -     Minutes  - 15  -       T5 Nustep   Level _0 Minutes 15 15 15  METs  -  - 2       Biostep-RELP   Level _0 Watts -  50 spm -  50 spm  -     Minutes _1 METs _2 Home Exercise Plan   Plans to continue exercise at  [facility] (comment)  walking and [employer] (comment)  walking and [employer] ([employer])     Frequency  - Add 2 additional days to program exercise sessions. Add 2 additional days to program exercise sessions.        Exercise Comments:     Exercise Comments    Row Name 01/17/16 1020 01/19/16 1448 01/21/16 0926 02/02/16 1529 02/17/16 1020   Exercise Comments First full day of exercise!  Patient was oriented to gym and equipment including functions, settings, policies, and procedures.  Patient's individual exercise prescription and treatment plan were reviewed.  All starting workloads were established based on the results of the 6 minute walk test done at initial orientation visit.  The plan for exercise progression was also introduced and progression will be customized based on patient's performance and goals. Dennis Mullen is off to a good start with exercise.  His blood sugars have been low at the beginning of exercise, but he is able to get high enough to exercise after taking glucose tablets.  Today, he was low again  after exercise. (64)  He was encouraged to contact his endocrinolgist about holding his insulin until after exercise.  We will follow up on  Friday.  We will continue to monitor for progression. Switched out treadmill for bike today.  Dennis Mullen liked the bike much better. Dennis Mullen is doing well with exercise.  He likes the [employer] bike for exercise, says he likes the breeze!  We will continue to monitor his progression. Dennis Mullen has been out with cellulitis in his foot.  Waiting for clearance to return.      Discharge Exercise Prescription (Final Exercise Prescription Changes):     Exercise Prescription Changes - 02/02/16 1500      Exercise Review   Progression Yes     Response to Exercise   Blood Pressure (Admit) 124/70   Blood Pressure (Exercise) 172/80   Blood Pressure (Exit) 132/70   Heart Rate (Admit) 79 bpm   Heart Rate (Exercise) 107 bpm   Heart Rate (Exit) 86 bpm   Rating of Perceived Exertion (Exercise) 14   Symptoms none   Comments Home Exercise Guidelines given 01/21/16   Duration Progress to 45 minutes of aerobic exercise without signs/symptoms of physical distress   Intensity THRR unchanged     Progression   Progression Continue to progress workloads to maintain intensity without signs/symptoms of physical distress.   Average METs 3.5     Resistance Training   Training Prescription Yes   Weight 4 lbs   Reps 10-12     Interval Training   Interval Training No     Bike   Level 1.5   Minutes 15   METs 2     T5 Nustep   Level 3   Minutes 15   METs 2     Biostep-RELP   Level 3   Minutes 15   METs 3     Home Exercise Plan   Plans to continue exercise at [employer] (comment)  walking and [employer])   Frequency  Add 2 additional days to program exercise sessions.      Nutrition:  Target Goals: Understanding of nutrition guidelines, daily intake of sodium <1565m, cholesterol <2033m calories 30% from fat and 7% or less from saturated fats, daily  to have 5 or more servings of fruits and vegetables.  Biometrics:     Pre Biometrics - 01/13/16 1544      Pre Biometrics   Height 6' 1.25" (1.861 m)   Weight 288 lb 4.8 oz (130.8 kg)   Waist Circumference 50.75 inches   Hip Circumference 49 inches   Waist to Hip Ratio 1.04 %   BMI (Calculated) 37.9       Nutrition Therapy Plan and Nutrition Goals:     Nutrition Therapy & Goals - 01/13/16 1803      Intervention Plan   Intervention Prescribe, educate and counsel regarding individualized specific dietary modifications aiming towards targeted core components such as weight, hypertension, lipid management, diabetes, heart failure and other comorbidities.   Expected Outcomes Short Term Goal: Understand basic principles of dietary content, such as calories, fat, sodium, cholesterol and nutrients.;Short Term Goal: A plan has been developed with personal nutrition goals set during dietitian appointment.;Long Term Goal: Adherence to prescribed nutrition plan.      Nutrition Discharge: Rate Your Plate Scores:     Nutrition Assessments - 01/13/16 1803      Rate Your Plate Scores   Pre Score 71   Pre Score % 79 %      Nutrition Goals Re-Evaluation:   Psychosocial: Target Goals: Acknowledge presence or absence of depression, maximize coping skills, provide positive support system. Participant is able to verbalize types and ability to use techniques and skills needed for reducing stress and depression.  Initial Review & Psychosocial Screening:     Initial Psych Review & Screening - 01/13/16 1807      Initial Review   Current issues with Current Depression;Current Psychotropic Meds;Current Stress Concerns   Source of Stress Concerns Financial;Unable to participate in former interests or hobbies  Symptoms had kept from being active.. Hopes that getting back to exercise routine will help resolve this concern     Family Dynamics   Good Support System? Yes  Children and friends      Barriers   Psychosocial barriers to participate in program There are no identifiable barriers or psychosocial needs.;The patient should benefit from training in stress management and relaxation.     Screening Interventions   Interventions Encouraged to exercise      Quality of Life Scores:     Quality of Life - 01/13/16 1808      Quality of Life Scores   Health/Function Pre 20.53 %   Socioeconomic Pre 19.57 %   Psych/Spiritual Pre 20.71 %   Family Pre 21 %   GLOBAL Pre 20.42 %      PHQ-9: Recent Review Flowsheet Data    [facility] 01/13/2016 07/30/2014   Decreased Interest 0  0   Down, Depressed, Hopeless 0 0   PHQ - 2 Score 0 0   Altered sleeping 3 -   Tired, decreased energy 3 -   Change in appetite 3 -   Feeling bad or failure about yourself  1 -   Trouble concentrating 1 -   Moving slowly or fidgety/restless 1 -   Suicidal thoughts 0 -   PHQ-9 Score 12 -      Psychosocial Evaluation and Intervention:     Psychosocial Evaluation - 01/24/16  1013      Psychosocial Evaluation & Interventions   Interventions Encouraged to exercise with the program and follow exercise prescription;Stress management education   Comments Counselor met with Dennis Mullen Dennis Mullen) today for initial psychosocial evaluation.  Dennis Mullen is a 66 year old who has multiple health issues including Cardiac; Diabetes Type 2 and Bipolar Disorder.  He has (2) Adult Children and their families who live close by and Dennis Mullen is also a part of a church community.  He also has sleep apnea and uses a CPAP nightly but Dennis Mullen reports he may only sleep approx. 6 hours per night 3-4 nights per week and otherwise does not sleep well at all.  He states his Dr. has prescribed Ambien PRN to help with this, but Dennis Mullen "prefers not to take additional medications."  His appetite is up and down with his endocrinologist reportedly calling him a "carbaphobic."  Dennis Mullen was diagnosed with Bi-Polar Disorder shortly after his spouse  left him in 2007 and attempted suicide 5X afterwards.  The latest attempt was reported to be 4-5 years ago.  He states that his current medications keep his mood stable for the most part.  He stated he is somewhat anxious and his mood is above average currently.  His stressors consist of his health, finances and some stress in relationships in his life.  He has goals to increase his stamina and strength and to exercise consistently while in this program.  Counselor encouraged Dennis Mullen to take his medication for sleep as needed per Dr. recommendations.  He also would benefit from a Divorce Care support group to help with the unresolved grief from his wife leaving him almost 10 years ago.  Counselor shared this information with Dennis Mullen and will be following with him throughout the course of this program.     Continued Psychosocial Services Needed Yes  Follow re: sleep and support group.  He will also benefit from the psychoeducational components of this program - especially stress management.        Psychosocial Re-Evaluation:   Vocational Rehabilitation: Provide vocational rehab assistance to qualifying candidates.   Vocational Rehab Evaluation & Intervention:     Vocational Rehab - 01/13/16 1811      Initial Vocational Rehab Evaluation & Intervention   Assessment shows need for Vocational Rehabilitation No      Education: Education Goals: Education classes will be provided on a weekly basis, covering required topics. Participant will state understanding/return demonstration of topics presented.  Learning Barriers/Preferences:     Learning Barriers/Preferences - 01/13/16 1811      Learning Barriers/Preferences   Learning Barriers None   Learning Preferences None      Education Topics: General Nutrition Guidelines/Fats and Fiber: -Group instruction provided by verbal, written material, models and posters to present the general guidelines for heart healthy nutrition. Gives an explanation  and review of dietary fats and fiber.   Controlling Sodium/Reading Food Labels: -Group verbal and written material supporting the discussion of sodium use in heart healthy nutrition. Review and explanation with models, verbal and written materials for utilization of the food label.   Exercise Physiology & Risk Factors: - Group verbal and written instruction with models to review the exercise physiology of the cardiovascular system and associated critical values. Details cardiovascular disease risk factors and the goals associated with each risk factor. Flowsheet Row Cardiac Rehab from 01/31/2016 in [facility] Cardiac and Pulmonary Rehab  Date  01/17/16  Educator  [facility]  Instruction Review Code  2- meets goals/outcomes  Aerobic Exercise & Resistance Training: - Gives group verbal and written discussion on the health impact of inactivity. On the components of aerobic and resistive training programs and the benefits of this training and how to safely progress through these programs. Flowsheet Row Cardiac Rehab from 01/31/2016 in [facility] Cardiac and Pulmonary Rehab  Date  01/19/16  Educator  [facility]  Instruction Review Code  2- meets goals/outcomes      Flexibility, Balance, General Exercise Guidelines: - Provides group verbal and written instruction on the benefits of flexibility and balance training programs. Provides general exercise guidelines with specific guidelines to those with heart or lung disease. Demonstration and skill practice provided. Flowsheet Row Cardiac Rehab from 01/31/2016 in [facility] Cardiac and Pulmonary Rehab  Date  01/24/16  Educator  [facility]  Instruction Review Code  2- meets goals/outcomes      Stress Management: - Provides group verbal and written instruction about the health risks of elevated stress, cause of high stress, and healthy ways to reduce stress.   Depression: - Provides group verbal and written instruction on the correlation between heart/lung disease and  depressed mood, treatment options, and the stigmas associated with seeking treatment.   Anatomy & Physiology of the Heart: - Group verbal and written instruction and models provide basic cardiac anatomy and physiology, with the coronary electrical and arterial systems. Review of: AMI, Angina, Valve disease, Heart Failure, Cardiac Arrhythmia, Pacemakers, and the ICD. Flowsheet Row Cardiac Rehab from 01/31/2016 in [facility] Cardiac and Pulmonary Rehab  Date  01/31/16  Educator  CE  Instruction Review Code  2- meets goals/outcomes      Cardiac Procedures: - Group verbal and written instruction and models to describe the testing methods done to diagnose heart disease. Reviews the outcomes of the test results. Describes the treatment choices: Medical Management, Angioplasty, or Coronary Bypass Surgery.   Cardiac Medications: - Group verbal and written instruction to review commonly prescribed medications for heart disease. Reviews the medication, class of the drug, and side effects. Includes the steps to properly store meds and maintain the prescription regimen.   Go Sex-Intimacy & Heart Disease, Get SMART - Goal Setting: - Group verbal and written instruction through game format to discuss heart disease and the return to sexual intimacy. Provides group verbal and written material to discuss and apply goal setting through the application of the S.M.A.R.T. Method.   Other Matters of the Heart: - Provides group verbal, written materials and models to describe Heart Failure, Angina, Valve Disease, and Diabetes in the realm of heart disease. Includes description of the disease process and treatment options available to the cardiac patient. Flowsheet Row Cardiac Rehab from 01/31/2016 in [facility] Cardiac and Pulmonary Rehab  Date  01/31/16  Educator  CE  Instruction Review Code  2- meets goals/outcomes      Exercise & Equipment Safety: - Individual verbal instruction and demonstration of equipment use  and safety with use of the equipment. Flowsheet Row Cardiac Rehab from 01/31/2016 in [facility] Cardiac and Pulmonary Rehab  Date  01/13/16  Educator  SB  Instruction Review Code  2- meets goals/outcomes      Infection Prevention: - Provides verbal and written material to individual with discussion of infection control including proper hand washing and proper equipment cleaning during exercise session. Flowsheet Row Cardiac Rehab from 01/31/2016 in [facility] Cardiac and Pulmonary Rehab  Date  01/13/16  Educator  SB  Instruction Review Code  2- meets goals/outcomes      Falls Prevention: - Provides  verbal and written material to individual with discussion of falls prevention and safety. Flowsheet Row Cardiac Rehab from 01/31/2016 in [facility] Cardiac and Pulmonary Rehab  Date  01/13/16  Educator  SB  Instruction Review Code  2- meets goals/outcomes      Diabetes: - Individual verbal and written instruction to review signs/symptoms of diabetes, desired ranges of glucose level fasting, after meals and with exercise. Advice that pre and post exercise glucose checks will be done for 3 sessions at entry of program. Flowsheet Row Cardiac Rehab from 01/31/2016 in [facility] Cardiac and Pulmonary Rehab  Date  01/13/16  Educator  SB  Instruction Review Code  2- meets goals/outcomes       Knowledge Questionnaire Score:     Knowledge Questionnaire Score - 01/13/16 1811      Knowledge Questionnaire Score   Pre Score 25/28      Core Components/Risk Factors/Patient Goals at Admission:     Personal Goals and Risk Factors at Admission - 01/13/16 1804      Core Components/Risk Factors/Patient Goals on Admission    Weight Management Obesity;Weight Maintenance;Yes   Goal Weight: Long Term 260 lb (117.9 kg)   Expected Outcomes Short Term: Continue to assess and modify interventions until short term weight is achieved;Long Term: Adherence to nutrition and physical activity/exercise program aimed toward  attainment of established weight goal;Weight Loss: Understanding of general recommendations for a balanced deficit meal plan, which promotes 1-2 lb weight loss per week and includes a negative energy balance of [phone] kcal/d   Sedentary Yes   Intervention Provide advice, education, support and counseling about physical activity/exercise needs.;Develop an individualized exercise prescription for aerobic and resistive training based on initial evaluation findings, risk stratification, comorbidities and participant's personal goals.   Expected Outcomes Achievement of increased cardiorespiratory fitness and enhanced flexibility, muscular endurance and strength shown through measurements of functional capacity and personal statement of participant.   Diabetes Yes   Intervention Provide education about signs/symptoms and action to take for hypo/hyperglycemia.;Provide education about proper nutrition, including hydration, and aerobic/resistive exercise prescription along with prescribed medications to achieve blood glucose in normal ranges: Fasting glucose 65-99 mg/dL   Expected Outcomes Short Term: Participant verbalizes understanding of the signs/symptoms and immediate care of hyper/hypoglycemia, proper foot care and importance of medication, aerobic/resistive exercise and nutrition plan for blood glucose control.;Long Term: Attainment of HbA1C < 7%.  LAst A1c 7.7%,ususally 6% or under   Hypertension Yes   Intervention Provide education on lifestyle modifcations including regular physical activity/exercise, weight management, moderate sodium restriction and increased consumption of fresh fruit, vegetables, and low fat dairy, alcohol moderation, and smoking cessation.;Monitor prescription use compliance.   Expected Outcomes Short Term: Continued assessment and intervention until BP is < 140/52m HG in hypertensive participants. < 130/839mHG in hypertensive participants with diabetes, heart failure or chronic  kidney disease.;Long Term: Maintenance of blood pressure at goal levels.   Lipids Yes   Intervention Provide education and support for participant on nutrition & aerobic/resistive exercise along with prescribed medications to achieve LDL <7047mHDL >65m38m Expected Outcomes Short Term: Participant states understanding of desired cholesterol values and is compliant with medications prescribed. Participant is following exercise prescription and nutrition guidelines.;Long Term: Cholesterol controlled with medications as prescribed, with individualized exercise RX and with personalized nutrition plan. Value goals: LDL < 70mg58mL > 40 mg.   Stress --  Financial stress   Intervention Offer individual and/or small group education and counseling on adjustment to heart disease,  stress management and health-related lifestyle change. Teach and support self-help strategies.;Refer participants experiencing significant psychosocial distress to appropriate mental health specialists for further evaluation and treatment. When possible, include family members and significant others in education/counseling sessions.   Expected Outcomes Short Term: Participant demonstrates changes in health-related behavior, relaxation and other stress management skills, ability to obtain effective social support, and compliance with psychotropic medications if prescribed.;Long Term: Emotional wellbeing is indicated by absence of clinically significant psychosocial distress or social isolation.      Core Components/Risk Factors/Patient Goals Review:    Core Components/Risk Factors/Patient Goals at Discharge (Final Review):    ITP Comments:     ITP Comments    Row Name 01/13/16 1754 01/26/16 1131 02/11/16 1041 02/21/16 1558 02/23/16 0604   ITP Comments Initial ITP created during medical review and evaluation. Diagnosis documentation found in [facility] 12/20/2015 Hospital Summary 30 day review. Continue with ITP unless changes  noted by Medical Director at signature of review. New to program Dennis Mullen called to let us know that he was in the hospital and diagnosed with cellulitis.  He was put on Augmentin and encouraged to get physician okay before returning to rehab. Dennis Mullen called to let us know that he was cleared to come back, but he does not have a note to return.  We will send a note to Dr. Cleda Mccreedy for clearance.  Dennis Mullen expressed interest in changing to 4 pm class, but we currently do not have any room in that class.  We will let Dennis Mullen know when he can return. 30 day review completed for Medical Director physician review and signature. Continue ITP unless changes made by physician.   Row Name 03/02/16 1503 03/14/16 1547 03/22/16 0648 03/29/16 1510 04/18/16 0602   ITP Comments Dennis Mullen has been cleared to return to rehab this week, but has yet to return.  No visits since last review. Dennis Mullen called and left a message stating that he was in the hospital for cellulitis.  He is to follow up as outpatient.  We will wait for clearance to return to rehab. 30 day review completed for review by Dr Emily Filbert.  Continue with ITP unless changes noted by Dr Sabra Heck. HAs been out, waiting for clearance to return Called to check on status of return.  LMOM cell phone.  Needs clearance to return from cellulitis on his foot. 30 day review. Continue with ITP unless directed changes per Medical Director review. Remains out with medical concerns   [place] Name 05/02/16 0845           ITP Comments Bob's last visit was 01/31/2016.  He has been out with multiple rounds of cellulitis and foot ulcers.  His podiatrist would like to continue to hold his rehab until healed.  After speaking with Dennis Mullen yesterday, he would like to drop for now and come back once he is cleared to finish the program.          Comments: Discharge ITP

## 2016-05-02 NOTE — Progress Notes (Signed)
Discharge Summary  Patient Details  Name: Dennis Mullen MRN: [id number] Date of Birth: [birth year] Referring Provider:   Flowsheet Row Cardiac Rehab from 01/13/2016 in [facility]  Referring Provider  [name]       Number of Visits: 9  Reason for Discharge:  Early Exit:  Physician request to hold rehab until foot ulcer healed  Smoking History:  History  Smoking Status  . Never Smoker  Smokeless Tobacco  . Never Used    Diagnosis:  NSTEMI (non-ST elevated myocardial infarction) ([place])  ADL [facility]:   Initial Exercise Prescription:     Initial Exercise Prescription - 01/13/16 1500      Date of Initial Exercise RX and Referring Provider   Date 01/13/16   Referring Provider Lacie Scotts   Level 2   Minutes 15   METs 2.5     Recumbant Bike   Level 2   RPM 60   Minutes 15   METs 2.5     NuStep   Level 3   Minutes 15   METs 2.5     Arm Ergometer   Level 2   Minutes 15   METs 2.5     REL-XR   Level 2   Minutes 15   METs 2.5     T5 Nustep   Level 2   Minutes 15   METs 2.5     Prescription Details   Frequency (times per week) 3   Duration Progress to 45 minutes of aerobic exercise without signs/symptoms of physical distress     Intensity   THRR 40-80% of Max Heartrate 105-139   Ratings of Perceived Exertion 11-13   Perceived Dyspnea 0-4     Progression   Progression Continue to progress workloads to maintain intensity without signs/symptoms of physical distress.     Resistance Training   Training Prescription Yes   Weight 3   Reps 10-15      Discharge Exercise Prescription (Final Exercise Prescription Changes):     Exercise Prescription Changes - 02/02/16 1500      Exercise Review   Progression Yes     Response to Exercise   Blood Pressure (Admit) 124/70   Blood Pressure (Exercise) 172/80   Blood Pressure (Exit) 132/70   Heart Rate (Admit) 79 bpm   Heart Rate (Exercise) 107 bpm   Heart Rate (Exit)  86 bpm   Rating of Perceived Exertion (Exercise) 14   Symptoms none   Comments Home Exercise Guidelines given 01/21/16   Duration Progress to 45 minutes of aerobic exercise without signs/symptoms of physical distress   Intensity THRR unchanged     Progression   Progression Continue to progress workloads to maintain intensity without signs/symptoms of physical distress.   Average METs 3.5     Resistance Training   Training Prescription Yes   Weight 4 lbs   Reps 10-12     Interval Training   Interval Training No     Bike   Level 1.5   Minutes 15   METs 2     T5 Nustep   Level 3   Minutes 15   METs 2     Biostep-RELP   Level 3   Minutes 15   METs 3     Home Exercise Plan   Plans to continue exercise at [employer] (comment)  walking and [employer] (Silver Sneakers)   Frequency Add 2 additional days to program exercise sessions.  Functional Capacity:     6 Minute Walk    Row Name 01/13/16 1545         6 Minute Walk   Distance 1125 feet     Walk Time 6 minutes     # of Rest Breaks 0     MPH 2.13     METS 2.6     RPE 17     VO2 Peak 9.08     Symptoms No        Psychological, QOL, Others - Outcomes: PHQ 2/9: Depression screen [facility] 2/9 01/13/2016 07/30/2014  Decreased Interest 0 0  Down, Depressed, Hopeless 0 0  PHQ - 2 Score 0 0  Altered sleeping 3 -  Tired, decreased energy 3 -  Change in appetite 3 -  Feeling bad or failure about yourself  1 -  Trouble concentrating 1 -  Moving slowly or fidgety/restless 1 -  Suicidal thoughts 0 -  PHQ-9 Score 12 -    Quality of Life:     Quality of Life - 01/13/16 1808      Quality of Life Scores   Health/Function Pre 20.53 %   Socioeconomic Pre 19.57 %   Psych/Spiritual Pre 20.71 %   Family Pre 21 %   GLOBAL Pre 20.42 %      Personal Goals: Goals established at orientation with interventions provided to work toward goal.     Personal Goals and Risk Factors at Admission - 01/13/16 1804       Core Components/Risk Factors/Patient Goals on Admission    Weight Management Obesity;Weight Maintenance;Yes   Goal Weight: Long Term 260 lb (117.9 kg)   Expected Outcomes Short Term: Continue to assess and modify interventions until short term weight is achieved;Long Term: Adherence to nutrition and physical activity/exercise program aimed toward attainment of established weight goal;Weight Loss: Understanding of general recommendations for a balanced deficit meal plan, which promotes 1-2 lb weight loss per week and includes a negative energy balance of [phone] kcal/d   Sedentary Yes   Intervention Provide advice, education, support and counseling about physical activity/exercise needs.;Develop an individualized exercise prescription for aerobic and resistive training based on initial evaluation findings, risk stratification, comorbidities and participant's personal goals.   Expected Outcomes Achievement of increased cardiorespiratory fitness and enhanced flexibility, muscular endurance and strength shown through measurements of functional capacity and personal statement of participant.   Diabetes Yes   Intervention Provide education about signs/symptoms and action to take for hypo/hyperglycemia.;Provide education about proper nutrition, including hydration, and aerobic/resistive exercise prescription along with prescribed medications to achieve blood glucose in normal ranges: Fasting glucose 65-99 mg/dL   Expected Outcomes Short Term: Participant verbalizes understanding of the signs/symptoms and immediate care of hyper/hypoglycemia, proper foot care and importance of medication, aerobic/resistive exercise and nutrition plan for blood glucose control.;Long Term: Attainment of HbA1C < 7%.  LAst A1c 7.7%,ususally 6% or under   Hypertension Yes   Intervention Provide education on lifestyle modifcations including regular physical activity/exercise, weight management, moderate sodium restriction and  increased consumption of fresh fruit, vegetables, and low fat dairy, alcohol moderation, and smoking cessation.;Monitor prescription use compliance.   Expected Outcomes Short Term: Continued assessment and intervention until BP is < 140/60mm HG in hypertensive participants. < 130/36mm HG in hypertensive participants with diabetes, heart failure or chronic kidney disease.;Long Term: Maintenance of blood pressure at goal levels.   Lipids Yes   Intervention Provide education and support for participant on nutrition & aerobic/resistive exercise along with prescribed  medications to achieve LDL 70mg , HDL >40mg .   Expected Outcomes Short Term: Participant states understanding of desired cholesterol values and is compliant with medications prescribed. Participant is following exercise prescription and nutrition guidelines.;Long Term: Cholesterol controlled with medications as prescribed, with individualized exercise RX and with personalized nutrition plan. Value goals: LDL < 70mg , HDL > 40 mg.   Stress --  Financial stress   Intervention Offer individual and/or small group education and counseling on adjustment to heart disease, stress management and health-related lifestyle change. Teach and support self-help strategies.;Refer participants experiencing significant psychosocial distress to appropriate mental health specialists for further evaluation and treatment. When possible, include family members and significant others in education/counseling sessions.   Expected Outcomes Short Term: Participant demonstrates changes in health-related behavior, relaxation and other stress management skills, ability to obtain effective social support, and compliance with psychotropic medications if prescribed.;Long Term: Emotional wellbeing is indicated by absence of clinically significant psychosocial distress or social isolation.       Personal Goals Discharge:   Nutrition & Weight - Outcomes:     Pre Biometrics -  01/13/16 1544      Pre Biometrics   Height 6' 1.25" (1.861 m)   Weight 288 lb 4.8 oz (130.8 kg)   Waist Circumference 50.75 inches   Hip Circumference 49 inches   Waist to Hip Ratio 1.04 %   BMI (Calculated) 37.9       Nutrition:     Nutrition Therapy & Goals - 01/13/16 1803      Intervention Plan   Intervention Prescribe, educate and counsel regarding individualized specific dietary modifications aiming towards targeted core components such as weight, hypertension, lipid management, diabetes, heart failure and other comorbidities.   Expected Outcomes Short Term Goal: Understand basic principles of dietary content, such as calories, fat, sodium, cholesterol and nutrients.;Short Term Goal: A plan has been developed with personal nutrition goals set during dietitian appointment.;Long Term Goal: Adherence to prescribed nutrition plan.      Nutrition Discharge:     Nutrition Assessments - 01/13/16 1803      Rate Your Plate Scores   Pre Score 71   Pre Score % 79 %      Education Questionnaire Score:     Knowledge Questionnaire Score - 01/13/16 1811      Knowledge Questionnaire Score   Pre Score 25/28      Goals reviewed with patient; copy given to patient.

## 2016-05-09 DIAGNOSIS — I2510 Atherosclerotic heart disease of native coronary artery without angina pectoris: Secondary | ICD-10-CM | POA: Diagnosis not present

## 2016-05-09 DIAGNOSIS — I10 Essential (primary) hypertension: Secondary | ICD-10-CM | POA: Diagnosis not present

## 2016-05-09 DIAGNOSIS — I6523 Occlusion and stenosis of bilateral carotid arteries: Secondary | ICD-10-CM | POA: Diagnosis not present

## 2016-05-15 ENCOUNTER — Telehealth (INDEPENDENT_AMBULATORY_CARE_PROVIDER_SITE_OTHER): Payer: Self-pay

## 2016-05-15 ENCOUNTER — Other Ambulatory Visit: Payer: Self-pay | Admitting: Psychiatry

## 2016-05-15 DIAGNOSIS — L97521 Non-pressure chronic ulcer of other part of left foot limited to breakdown of skin: Secondary | ICD-10-CM | POA: Diagnosis not present

## 2016-05-15 NOTE — Telephone Encounter (Signed)
Patient stated that insurance denied him for the lymphedema pump and wondering if there any additional information we could do to assist to help with approval.

## 2016-05-16 ENCOUNTER — Encounter: Payer: Self-pay | Admitting: Psychiatry

## 2016-05-16 ENCOUNTER — Ambulatory Visit (INDEPENDENT_AMBULATORY_CARE_PROVIDER_SITE_OTHER): Payer: 59 | Admitting: Psychiatry

## 2016-05-16 VITALS — BP 135/74 | HR 73 | Temp 97.4°F

## 2016-05-16 DIAGNOSIS — F3181 Bipolar II disorder: Secondary | ICD-10-CM | POA: Diagnosis not present

## 2016-05-16 DIAGNOSIS — Z794 Long term (current) use of insulin: Secondary | ICD-10-CM | POA: Diagnosis not present

## 2016-05-16 DIAGNOSIS — E11649 Type 2 diabetes mellitus with hypoglycemia without coma: Secondary | ICD-10-CM | POA: Diagnosis not present

## 2016-05-16 DIAGNOSIS — E039 Hypothyroidism, unspecified: Secondary | ICD-10-CM | POA: Diagnosis not present

## 2016-05-16 MED ORDER — DULOXETINE HCL 60 MG PO CPEP: 60.0000 mg | ORAL_CAPSULE | Freq: Every day | ORAL | 1 refills | Status: DC

## 2016-05-16 MED ORDER — DIVALPROEX SODIUM ER 500 MG PO TB24: 500.0000 mg | ORAL_TABLET | Freq: Two times a day (BID) | ORAL | 1 refills | Status: DC

## 2016-05-16 NOTE — Progress Notes (Signed)
Follow-up for patient with chronic mood instability. He tells me he has been feeling very good. He sees Otila Kluver for therapy every other week. He is working on Chief Technology Officer of emotions. Patient discusses some recent medical concerns. Very lucid. No sign of any dangerous behavior.  Casually dressed neatly groomed. Good eye contact. Normal psychomotor activity. Speech normal rate tone and volume. Affect euthymic. Denies auditory or visual hallucinations. Denies suicidal or homicidal ideation. Judgment and insight intact.  Supportive counseling and review of medication. Renew prescriptions. Follow-up 3 months.

## 2016-05-19 ENCOUNTER — Encounter: Payer: Self-pay | Admitting: *Deleted

## 2016-05-23 DIAGNOSIS — I6523 Occlusion and stenosis of bilateral carotid arteries: Secondary | ICD-10-CM | POA: Diagnosis not present

## 2016-05-23 DIAGNOSIS — E11649 Type 2 diabetes mellitus with hypoglycemia without coma: Secondary | ICD-10-CM | POA: Diagnosis not present

## 2016-05-23 DIAGNOSIS — E039 Hypothyroidism, unspecified: Secondary | ICD-10-CM | POA: Diagnosis not present

## 2016-05-23 DIAGNOSIS — Z8631 Personal history of diabetic foot ulcer: Secondary | ICD-10-CM | POA: Diagnosis not present

## 2016-05-23 DIAGNOSIS — I25118 Atherosclerotic heart disease of native coronary artery with other forms of angina pectoris: Secondary | ICD-10-CM | POA: Diagnosis not present

## 2016-05-23 DIAGNOSIS — E1142 Type 2 diabetes mellitus with diabetic polyneuropathy: Secondary | ICD-10-CM | POA: Diagnosis not present

## 2016-05-25 DIAGNOSIS — H2513 Age-related nuclear cataract, bilateral: Secondary | ICD-10-CM | POA: Diagnosis not present

## 2016-05-25 DIAGNOSIS — E113293 Type 2 diabetes mellitus with mild nonproliferative diabetic retinopathy without macular edema, bilateral: Secondary | ICD-10-CM | POA: Diagnosis not present

## 2016-05-31 ENCOUNTER — Observation Stay
Admission: EM | Admit: 2016-05-31 | Discharge: 2016-06-02 | Disposition: A | Discharge status: Home / Self Care | Payer: Medicare Other | Attending: Internal Medicine | Admitting: Internal Medicine

## 2016-05-31 ENCOUNTER — Emergency Department: Payer: Medicare Other

## 2016-05-31 ENCOUNTER — Encounter: Payer: Self-pay | Admitting: Emergency Medicine

## 2016-05-31 ENCOUNTER — Observation Stay
Admit: 2016-05-31 | Discharge: 2016-05-31 | Disposition: A | Discharge status: Home / Self Care | Payer: Medicare Other | Attending: Internal Medicine | Admitting: Internal Medicine

## 2016-05-31 DIAGNOSIS — I4891 Unspecified atrial fibrillation: Secondary | ICD-10-CM

## 2016-05-31 DIAGNOSIS — G4700 Insomnia, unspecified: Secondary | ICD-10-CM | POA: Insufficient documentation

## 2016-05-31 DIAGNOSIS — Z7901 Long term (current) use of anticoagulants: Secondary | ICD-10-CM | POA: Insufficient documentation

## 2016-05-31 DIAGNOSIS — E291 Testicular hypofunction: Secondary | ICD-10-CM | POA: Insufficient documentation

## 2016-05-31 DIAGNOSIS — F319 Bipolar disorder, unspecified: Secondary | ICD-10-CM | POA: Insufficient documentation

## 2016-05-31 DIAGNOSIS — M109 Gout, unspecified: Secondary | ICD-10-CM | POA: Diagnosis not present

## 2016-05-31 DIAGNOSIS — E1151 Type 2 diabetes mellitus with diabetic peripheral angiopathy without gangrene: Secondary | ICD-10-CM | POA: Insufficient documentation

## 2016-05-31 DIAGNOSIS — Z794 Long term (current) use of insulin: Secondary | ICD-10-CM | POA: Insufficient documentation

## 2016-05-31 DIAGNOSIS — G4733 Obstructive sleep apnea (adult) (pediatric): Secondary | ICD-10-CM | POA: Diagnosis not present

## 2016-05-31 DIAGNOSIS — N529 Male erectile dysfunction, unspecified: Secondary | ICD-10-CM | POA: Diagnosis not present

## 2016-05-31 DIAGNOSIS — K219 Gastro-esophageal reflux disease without esophagitis: Secondary | ICD-10-CM | POA: Insufficient documentation

## 2016-05-31 DIAGNOSIS — R778 Other specified abnormalities of plasma proteins: Secondary | ICD-10-CM

## 2016-05-31 DIAGNOSIS — Z955 Presence of coronary angioplasty implant and graft: Secondary | ICD-10-CM | POA: Insufficient documentation

## 2016-05-31 DIAGNOSIS — Z7982 Long term (current) use of aspirin: Secondary | ICD-10-CM | POA: Insufficient documentation

## 2016-05-31 DIAGNOSIS — F603 Borderline personality disorder: Secondary | ICD-10-CM | POA: Insufficient documentation

## 2016-05-31 DIAGNOSIS — R7989 Other specified abnormal findings of blood chemistry: Secondary | ICD-10-CM | POA: Diagnosis not present

## 2016-05-31 DIAGNOSIS — N400 Enlarged prostate without lower urinary tract symptoms: Secondary | ICD-10-CM | POA: Diagnosis not present

## 2016-05-31 DIAGNOSIS — Z6836 Body mass index (BMI) 36.0-36.9, adult: Secondary | ICD-10-CM | POA: Insufficient documentation

## 2016-05-31 DIAGNOSIS — I110 Hypertensive heart disease with heart failure: Secondary | ICD-10-CM | POA: Insufficient documentation

## 2016-05-31 DIAGNOSIS — R079 Chest pain, unspecified: Secondary | ICD-10-CM | POA: Diagnosis not present

## 2016-05-31 DIAGNOSIS — I252 Old myocardial infarction: Secondary | ICD-10-CM | POA: Diagnosis not present

## 2016-05-31 DIAGNOSIS — E669 Obesity, unspecified: Secondary | ICD-10-CM | POA: Insufficient documentation

## 2016-05-31 DIAGNOSIS — I25119 Atherosclerotic heart disease of native coronary artery with unspecified angina pectoris: Principal | ICD-10-CM | POA: Insufficient documentation

## 2016-05-31 DIAGNOSIS — I2510 Atherosclerotic heart disease of native coronary artery without angina pectoris: Secondary | ICD-10-CM | POA: Diagnosis not present

## 2016-05-31 DIAGNOSIS — Z7902 Long term (current) use of antithrombotics/antiplatelets: Secondary | ICD-10-CM | POA: Insufficient documentation

## 2016-05-31 DIAGNOSIS — F419 Anxiety disorder, unspecified: Secondary | ICD-10-CM | POA: Insufficient documentation

## 2016-05-31 DIAGNOSIS — I509 Heart failure, unspecified: Secondary | ICD-10-CM | POA: Insufficient documentation

## 2016-05-31 DIAGNOSIS — E039 Hypothyroidism, unspecified: Secondary | ICD-10-CM | POA: Insufficient documentation

## 2016-05-31 DIAGNOSIS — E119 Type 2 diabetes mellitus without complications: Secondary | ICD-10-CM | POA: Diagnosis not present

## 2016-05-31 DIAGNOSIS — I083 Combined rheumatic disorders of mitral, aortic and tricuspid valves: Secondary | ICD-10-CM | POA: Diagnosis not present

## 2016-05-31 DIAGNOSIS — I10 Essential (primary) hypertension: Secondary | ICD-10-CM | POA: Diagnosis not present

## 2016-05-31 DIAGNOSIS — Z9641 Presence of insulin pump (external) (internal): Secondary | ICD-10-CM | POA: Insufficient documentation

## 2016-05-31 DIAGNOSIS — I214 Non-ST elevation (NSTEMI) myocardial infarction: Secondary | ICD-10-CM | POA: Diagnosis not present

## 2016-05-31 LAB — CBC
HCT: 34 % — ABNORMAL LOW (ref 40.0–52.0)
HEMOGLOBIN: 11.6 g/dL — AB (ref 13.0–18.0)
MCH: 28.7 pg (ref 26.0–34.0)
MCHC: 34 g/dL (ref 32.0–36.0)
MCV: 84.4 fL (ref 80.0–100.0)
Platelets: 267 10*3/uL (ref 150–440)
RBC: 4.03 MIL/uL — ABNORMAL LOW (ref 4.40–5.90)
RDW: 18.5 % — ABNORMAL HIGH (ref 11.5–14.5)
WBC: 8.1 10*3/uL (ref 3.8–10.6)

## 2016-05-31 LAB — BASIC METABOLIC PANEL
ANION GAP: 10 (ref 5–15)
BUN: 32 mg/dL — ABNORMAL HIGH (ref 6–20)
CALCIUM: 9.3 mg/dL (ref 8.9–10.3)
CHLORIDE: 102 mmol/L (ref 101–111)
CO2: 22 mmol/L (ref 22–32)
Creatinine, Ser: 1.26 mg/dL — ABNORMAL HIGH (ref 0.61–1.24)
GFR calc non Af Amer: 59 mL/min — ABNORMAL LOW (ref >60)
Glucose, Bld: 306 mg/dL — ABNORMAL HIGH (ref 65–99)
Potassium: 4.3 mmol/L (ref 3.5–5.1)
SODIUM: 134 mmol/L — AB (ref 135–145)

## 2016-05-31 LAB — TROPONIN I
TROPONIN I: 0.07 ng/mL — AB (ref <0.03)
TROPONIN I: 0.89 ng/mL — AB (ref <0.03)
TROPONIN I: 1.79 ng/mL — AB (ref <0.03)
TROPONIN I: 1.37 ng/mL — AB (ref <0.03)

## 2016-05-31 LAB — GLUCOSE, CAPILLARY
GLUCOSE-CAPILLARY: 303 mg/dL — AB (ref 65–99)
Glucose-Capillary: 309 mg/dL — ABNORMAL HIGH (ref 65–99)

## 2016-05-31 LAB — ECHOCARDIOGRAM COMPLETE: WEIGHTICAEL: 4528 [oz_av]

## 2016-05-31 MED ORDER — ALLOPURINOL 100 MG PO TABS
100.0000 mg | ORAL_TABLET | Freq: Every morning | ORAL | Status: DC
  Administered 2016-05-31 – 2016-06-02 (×2): 100 mg via ORAL
  Filled 2016-05-31 (×2): qty 1

## 2016-05-31 MED ORDER — VITAMIN C 500 MG PO TABS
500.0000 mg | ORAL_TABLET | Freq: Every day | ORAL | Status: DC
  Administered 2016-05-31 – 2016-06-02 (×2): 500 mg via ORAL
  Filled 2016-05-31 (×2): qty 1

## 2016-05-31 MED ORDER — SODIUM CHLORIDE 0.9 % IV SOLN: 250.0000 mL | INTRAVENOUS | Status: DC | PRN

## 2016-05-31 MED ORDER — FENOFIBRATE 160 MG PO TABS
160.0000 mg | ORAL_TABLET | Freq: Every day | ORAL | Status: DC
  Administered 2016-05-31 – 2016-06-02 (×2): 160 mg via ORAL
  Filled 2016-05-31 (×2): qty 1

## 2016-05-31 MED ORDER — DILTIAZEM HCL 25 MG/5ML IV SOLN
INTRAVENOUS | Status: AC
  Filled 2016-05-31: qty 5

## 2016-05-31 MED ORDER — METFORMIN HCL 500 MG PO TABS
1000.0000 mg | ORAL_TABLET | Freq: Two times a day (BID) | ORAL | Status: DC
  Filled 2016-05-31: qty 2

## 2016-05-31 MED ORDER — SODIUM CHLORIDE 0.9% FLUSH
3.0000 mL | Freq: Two times a day (BID) | INTRAVENOUS | Status: DC
Start: 2016-06-01 — End: 2016-06-01
  Administered 2016-06-01: 3 mL via INTRAVENOUS

## 2016-05-31 MED ORDER — ZOLPIDEM TARTRATE 5 MG PO TABS: 10.0000 mg | ORAL_TABLET | Freq: Every evening | ORAL | Status: DC | PRN

## 2016-05-31 MED ORDER — V-GO 30 KIT: 0.8000 [IU] | PACK | Status: DC

## 2016-05-31 MED ORDER — DIVALPROEX SODIUM ER 500 MG PO TB24: 500.0000 mg | ORAL_TABLET | Freq: Two times a day (BID) | ORAL | Status: DC

## 2016-05-31 MED ORDER — ENOXAPARIN SODIUM 150 MG/ML ~~LOC~~ SOLN
1.0000 mg/kg | Freq: Two times a day (BID) | SUBCUTANEOUS | Status: DC
  Administered 2016-05-31 – 2016-06-02 (×3): 130 mg via SUBCUTANEOUS
  Filled 2016-05-31 (×5): qty 0.86

## 2016-05-31 MED ORDER — ISOSORBIDE MONONITRATE ER 60 MG PO TB24
60.0000 mg | ORAL_TABLET | Freq: Every day | ORAL | Status: DC
  Administered 2016-05-31 – 2016-06-02 (×2): 60 mg via ORAL
  Filled 2016-05-31 (×2): qty 1

## 2016-05-31 MED ORDER — DILTIAZEM HCL 25 MG/5ML IV SOLN
10.0000 mg | Freq: Once | INTRAVENOUS | Status: AC
  Administered 2016-05-31: 10 mg via INTRAVENOUS

## 2016-05-31 MED ORDER — ENOXAPARIN SODIUM 40 MG/0.4ML ~~LOC~~ SOLN: 40.0000 mg | SUBCUTANEOUS | Status: DC

## 2016-05-31 MED ORDER — DULOXETINE HCL 60 MG PO CPEP
60.0000 mg | ORAL_CAPSULE | Freq: Every day | ORAL | Status: DC
  Administered 2016-05-31 – 2016-06-02 (×2): 60 mg via ORAL
  Filled 2016-05-31 (×2): qty 1

## 2016-05-31 MED ORDER — ASPIRIN EC 81 MG PO TBEC
81.0000 mg | DELAYED_RELEASE_TABLET | Freq: Every morning | ORAL | Status: DC
  Administered 2016-05-31: 81 mg via ORAL
  Filled 2016-05-31 (×2): qty 1

## 2016-05-31 MED ORDER — ONDANSETRON HCL 4 MG/2ML IJ SOLN: 4.0000 mg | Freq: Four times a day (QID) | INTRAMUSCULAR | Status: DC | PRN

## 2016-05-31 MED ORDER — AMLODIPINE BESYLATE 5 MG PO TABS
5.0000 mg | ORAL_TABLET | Freq: Every day | ORAL | Status: DC
  Administered 2016-05-31 – 2016-06-01 (×2): 5 mg via ORAL
  Filled 2016-05-31 (×3): qty 1

## 2016-05-31 MED ORDER — DIVALPROEX SODIUM ER 500 MG PO TB24
500.0000 mg | ORAL_TABLET | Freq: Every morning | ORAL | Status: DC
  Administered 2016-06-01 – 2016-06-02 (×2): 500 mg via ORAL
  Filled 2016-05-31 (×4): qty 1

## 2016-05-31 MED ORDER — ONDANSETRON HCL 4 MG PO TABS: 4.0000 mg | ORAL_TABLET | Freq: Four times a day (QID) | ORAL | Status: DC | PRN

## 2016-05-31 MED ORDER — SODIUM CHLORIDE 0.9 % WEIGHT BASED INFUSION: 3.0000 mL/kg/h | INTRAVENOUS | Status: AC

## 2016-05-31 MED ORDER — LEVOTHYROXINE SODIUM 50 MCG PO TABS
50.0000 ug | ORAL_TABLET | Freq: Every day | ORAL | Status: DC
  Administered 2016-06-01 – 2016-06-02 (×2): 50 ug via ORAL
  Filled 2016-05-31 (×2): qty 1

## 2016-05-31 MED ORDER — PREGABALIN 50 MG PO CAPS
100.0000 mg | ORAL_CAPSULE | Freq: Three times a day (TID) | ORAL | Status: DC
  Administered 2016-05-31 – 2016-06-02 (×4): 100 mg via ORAL
  Filled 2016-05-31 (×5): qty 2

## 2016-05-31 MED ORDER — ACETAMINOPHEN 325 MG PO TABS: 650.0000 mg | ORAL_TABLET | Freq: Four times a day (QID) | ORAL | Status: DC | PRN

## 2016-05-31 MED ORDER — CLOPIDOGREL BISULFATE 75 MG PO TABS
75.0000 mg | ORAL_TABLET | Freq: Every morning | ORAL | Status: DC
  Administered 2016-05-31 – 2016-06-02 (×3): 75 mg via ORAL
  Filled 2016-05-31 (×3): qty 1

## 2016-05-31 MED ORDER — RISAQUAD PO CAPS
1.0000 | ORAL_CAPSULE | Freq: Every day | ORAL | Status: DC
  Administered 2016-05-31 – 2016-06-02 (×2): 1 via ORAL
  Filled 2016-05-31 (×2): qty 1

## 2016-05-31 MED ORDER — INSULIN ASPART 100 UNIT/ML ~~LOC~~ SOLN
0.0000 [IU] | Freq: Three times a day (TID) | SUBCUTANEOUS | Status: DC
  Administered 2016-05-31: 11 [IU] via SUBCUTANEOUS
  Administered 2016-06-01: 3 [IU] via SUBCUTANEOUS
  Administered 2016-06-01: 5 [IU] via SUBCUTANEOUS
  Administered 2016-06-02: 3 [IU] via SUBCUTANEOUS
  Administered 2016-06-02: 8 [IU] via SUBCUTANEOUS
  Filled 2016-05-31: qty 5
  Filled 2016-05-31: qty 3
  Filled 2016-05-31: qty 8
  Filled 2016-05-31: qty 11
  Filled 2016-05-31: qty 3

## 2016-05-31 MED ORDER — ROSUVASTATIN CALCIUM 20 MG PO TABS
20.0000 mg | ORAL_TABLET | Freq: Every day | ORAL | Status: DC
  Administered 2016-05-31 – 2016-06-02 (×2): 20 mg via ORAL
  Filled 2016-05-31 (×2): qty 1

## 2016-05-31 MED ORDER — SODIUM CHLORIDE 0.9 % WEIGHT BASED INFUSION
1.0000 mL/kg/h | INTRAVENOUS | Status: DC
Start: 2016-06-01 — End: 2016-06-01

## 2016-05-31 MED ORDER — ROPINIROLE HCL 1 MG PO TABS
1.0000 mg | ORAL_TABLET | Freq: Two times a day (BID) | ORAL | Status: DC
  Administered 2016-05-31 – 2016-06-02 (×4): 1 mg via ORAL
  Filled 2016-05-31 (×5): qty 1

## 2016-05-31 MED ORDER — ASPIRIN 81 MG PO CHEW
81.0000 mg | CHEWABLE_TABLET | ORAL | Status: AC
  Administered 2016-06-01: 81 mg via ORAL
  Filled 2016-05-31: qty 1

## 2016-05-31 MED ORDER — METOPROLOL SUCCINATE ER 50 MG PO TB24
50.0000 mg | ORAL_TABLET | Freq: Every day | ORAL | Status: DC
  Administered 2016-05-31 – 2016-06-02 (×2): 50 mg via ORAL
  Filled 2016-05-31 (×2): qty 1

## 2016-05-31 MED ORDER — NITROGLYCERIN 0.4 MG SL SUBL: 0.4000 mg | SUBLINGUAL_TABLET | SUBLINGUAL | Status: DC | PRN

## 2016-05-31 MED ORDER — LIRAGLUTIDE 18 MG/3ML ~~LOC~~ SOPN: 1.8000 mg | PEN_INJECTOR | Freq: Every day | SUBCUTANEOUS | Status: DC

## 2016-05-31 MED ORDER — PANTOPRAZOLE SODIUM 40 MG PO TBEC
40.0000 mg | DELAYED_RELEASE_TABLET | Freq: Every day | ORAL | Status: DC
  Administered 2016-05-31 – 2016-06-02 (×2): 40 mg via ORAL
  Filled 2016-05-31 (×2): qty 1

## 2016-05-31 MED ORDER — FUROSEMIDE 40 MG PO TABS
40.0000 mg | ORAL_TABLET | Freq: Every day | ORAL | Status: DC
  Administered 2016-05-31 – 2016-06-02 (×2): 40 mg via ORAL
  Filled 2016-05-31 (×2): qty 1

## 2016-05-31 MED ORDER — SODIUM CHLORIDE 0.9% FLUSH
3.0000 mL | Freq: Two times a day (BID) | INTRAVENOUS | Status: DC
  Administered 2016-05-31 – 2016-06-02 (×5): 3 mL via INTRAVENOUS

## 2016-05-31 MED ORDER — SODIUM CHLORIDE 0.9% FLUSH: 3.0000 mL | INTRAVENOUS | Status: DC | PRN

## 2016-05-31 MED ORDER — COLCHICINE 0.6 MG PO TABS: 0.6000 mg | ORAL_TABLET | Freq: Two times a day (BID) | ORAL | Status: DC | PRN

## 2016-05-31 MED ORDER — INSULIN GLARGINE 100 UNIT/ML ~~LOC~~ SOLN
20.0000 [IU] | Freq: Every day | SUBCUTANEOUS | Status: DC
  Administered 2016-05-31: 20 [IU] via SUBCUTANEOUS
  Filled 2016-05-31 (×3): qty 0.2

## 2016-05-31 MED ORDER — ACETAMINOPHEN 650 MG RE SUPP: 650.0000 mg | Freq: Four times a day (QID) | RECTAL | Status: DC | PRN

## 2016-05-31 MED ORDER — DIVALPROEX SODIUM ER 500 MG PO TB24
1000.0000 mg | ORAL_TABLET | Freq: Every day | ORAL | Status: DC
  Administered 2016-05-31 – 2016-06-01 (×2): 1000 mg via ORAL
  Filled 2016-05-31 (×4): qty 2

## 2016-05-31 MED ORDER — INSULIN ASPART 100 UNIT/ML ~~LOC~~ SOLN
0.0000 [IU] | Freq: Every day | SUBCUTANEOUS | Status: DC
  Administered 2016-05-31: 4 [IU] via SUBCUTANEOUS
  Administered 2016-06-01: 3 [IU] via SUBCUTANEOUS
  Filled 2016-05-31: qty 3
  Filled 2016-05-31: qty 1
  Filled 2016-05-31 (×2): qty 4

## 2016-05-31 NOTE — ED Notes (Signed)
RN walked into room to find pt on the floor. RN called out to the pt and the pt awoke to state he was tired and just wanted to lay down. Pt denied falling or pain. Pt NAD at time and placed in bed.

## 2016-05-31 NOTE — Plan of Care (Signed)
Problem: Pain Managment: Goal: General experience of comfort will improve Outcome: Progressing Patient denies any chest pain at this time.

## 2016-05-31 NOTE — Progress Notes (Signed)
Lovenox dosage adjustment per protocol Indication: NSTEMI  Lovenox 1 mg/kg q24h was adjusted to full-dose therapeutic dosing: Lovenox 1 mg/kg q12h to start 2/14 @ 1430.  CrCl 85 ml/min Pt wt. 128 kg  Patient will be started on Lovenox 130 mg every 12 hours.  Tobie Lords, PharmD, BCPS Clinical Pharmacist 05/31/2016

## 2016-05-31 NOTE — ED Triage Notes (Signed)
Patient coming from home [facility] with chest pain. Patient was trying to get comfortable in bed, per patient this has happened last few nights. Per EMS patient was Sinus tachycardia en route, patient has been out of metoprolol for a few days. EMS gave patient ASA 324mg  and 2 sprays of Nitro bringing chest pain from a 8/10 to 4/10. Patient is currently having pain 5/10. Patient has 6 coronary stents. Most recent stent September 2017 at [facility]

## 2016-05-31 NOTE — Progress Notes (Signed)
Inpatient Diabetes Program Recommendations  AACE/ADA: New Consensus Statement on Inpatient Glycemic Control (2015)  Target Ranges:  Prepandial:   less than 140 mg/dL      Peak postprandial:   less than 180 mg/dL (1-2 hours)      Critically ill patients:  140 - 180 mg/dL   Results for Dennis Mullen, Dennis Mullen (MRN [id number]) as of 05/31/2016 09:55  Ref. Range 05/31/2016 04:20  Glucose Latest Ref Range: 65 - 99 mg/dL 306 (H)   Review of Glycemic Control  Diabetes history: DM2 Outpatient Diabetes medications: V-Go 30 insulin pump (provides 30 units of basal insulin; and uses additional insulin for meals per pump), Metformin 1000 mg BID, Victoza 1.8 mg daily Current orders for Inpatient glycemic control: V-Go 30 insulin pump, Metformin 1000 mg BID, Victoza 1.8 mg daily (all orders signed and held at this time)  Inpatient Diabetes Program Recommendations: Insulin - Basal: Patient usually uses a disposable insulin pump (V-Go; changed every 24 hours) but patient reports that he does not have on his insulin pump due to issues with getting it started and was scheduled to see Endocrinologist today. Please order Lantus 20 units Q24H starting now. Correction (SSI): Please consider ordering CBGs with Novolog 0-15 units TID with meals and Novolog 0-5 units QHS.   Thanks, Barnie Alderman, RN, MSN, CDE Diabetes Coordinator Inpatient Diabetes Program [phone] (Team Pager from 8am to 5pm)

## 2016-05-31 NOTE — H&P (Signed)
[place] [place] NAME: Dennis Mullen    MR#:  [id number]  DATE OF BIRTH:  May 17, 1950  DATE OF ADMISSION:  05/31/2016  PRIMARY CARE PHYSICIAN: Lavera Guise, MD   REQUESTING/REFERRING PHYSICIAN: Dr. Charlesetta Ivory  CHIEF COMPLAINT:   Chief Complaint  Patient presents with  . Chest Pain    HISTORY OF PRESENT ILLNESS:  Dennis Mullen  is a 66 y.o. male with a known history of  CAD status post stents, the last one in September 2017, insulin-dependent diabetes mellitus, hypertension, GERD, depression and anxiety, congestive heart failure, sleep apnea presents to the hospital secondary to chest pressure and palpitations that started post midnight. Patient has a complicated cardiac history with 6 stents in the past, the last one being in September 2017 at [facility]. He says he has occasional angina that usually is relieved by nitroglycerin. He was in his normal state of health up until last night. Went to bed and suddenly woke up due to pressure in his chest that was radiating to his back without any improvement. Denies any nausea, vomiting. No fevers or chills. No dyspnea or diaphoresis. He was noted to be in atrial fibrillation with rapid ventricular response, heart rate of 1:15 the emergency room. No prior history of atrial fibrillation. He converted back to normal sinus rhythm with medications, however troponin is elevated. His chest pressure is much improved at this time. He is being admitted for observation  PAST MEDICAL HISTORY:   Past Medical History:  Diagnosis Date  . Abnormal levels of other serum enzymes   . Anxiety   . Bipolar disorder ([place])   . BPH (benign prostatic hyperplasia)   . CAD (coronary artery disease)   . CHF (congestive heart failure) ([place])    ?  Marland Kitchen Depression   . Diabetes mellitus, type II ([place])   . Diabetes mellitus, type II, insulin dependent ([place])   . ED (erectile dysfunction)   . GERD (gastroesophageal reflux  disease)   . Gout   . History of borderline personality disorder   . Hypertension   . Hypogonadism in male   . Insomnia   . Mood disorder ([place])   . Neuropathy ([place])   . Obesity   . OSA (obstructive sleep apnea)   . PVD (peripheral vascular disease) ([place])   . Thyroid disease     PAST SURGICAL HISTORY:   Past Surgical History:  Procedure Laterality Date  . CARDIAC CATHETERIZATION N/A 10/12/2015   Procedure: Left Heart Cath and Coronary Angiography;  Surgeon: Teodoro Spray, MD;  Location: [facility];  Service: Cardiovascular;  Laterality: N/A;  . CORONARY ANGIOPLASTY WITH STENT PLACEMENT     x5 stents  . FOOT SURGERY    . MASTOIDECTOMY Right   . NASAL SEPTUM SURGERY    . penial inplant    . PENILE PROSTHESIS IMPLANT    . right mastoidectomy    . SCROTOPLASTY    . TONSILLECTOMY AND ADENOIDECTOMY      SOCIAL HISTORY:   Social History  Substance Use Topics  . Smoking status: Never Smoker  . Smokeless tobacco: Never Used  . Alcohol use No    FAMILY HISTORY:   Family History  Problem Relation Age of Onset  . Lung cancer Mother   . Heart attack Father   . Post-traumatic stress disorder Father   . Anxiety disorder Father   . Depression Father   . Heart attack Sister   .  Heart attack Brother     DRUG ALLERGIES:  No Known Allergies  REVIEW OF SYSTEMS:   Review of Systems  Constitutional: Negative for chills, fever, malaise/fatigue and weight loss.  HENT: Negative for ear discharge, ear pain, hearing loss, nosebleeds and tinnitus.   Eyes: Negative for blurred vision, double vision and photophobia.  Respiratory: Positive for shortness of breath. Negative for cough, hemoptysis and wheezing.   Cardiovascular: Positive for chest pain and palpitations. Negative for orthopnea and leg swelling.  Gastrointestinal: Negative for abdominal pain, constipation, diarrhea, heartburn, melena, nausea and vomiting.  Genitourinary: Negative for dysuria and urgency.    Musculoskeletal: Negative for myalgias and neck pain.  Skin: Negative for rash.  Neurological: Negative for dizziness, sensory change, speech change, focal weakness and headaches.  Endo/Heme/Allergies: Does not bruise/bleed easily.  Psychiatric/Behavioral: Negative for depression.    MEDICATIONS AT HOME:   Prior to Admission medications   Medication Sig Start Date End Date Taking? Authorizing Provider  acidophilus (RISAQUAD) CAPS capsule Take 1 capsule by mouth daily.   Yes Historical Provider, MD  allopurinol (ZYLOPRIM) 100 MG tablet Take 100 mg by mouth every morning.   Yes Historical Provider, MD  amLODipine (NORVASC) 5 MG tablet Take 5 mg by mouth at bedtime.  10/14/14  Yes Historical Provider, MD  aspirin EC 81 MG tablet Take 81 mg by mouth every morning. Reported on 07/26/2015   Yes Historical Provider, MD  clopidogrel (PLAVIX) 75 MG tablet Take 75 mg by mouth every morning. Reported on 07/26/2015   Yes Historical Provider, MD  colchicine 0.6 MG tablet Take 0.6 mg by mouth 2 (two) times daily as needed (gout flare).  11/08/15  Yes Historical Provider, MD  diclofenac (VOLTAREN) 75 MG EC tablet Take 75 mg by mouth daily as needed (gout flare).  12/09/15  Yes Historical Provider, MD  divalproex (DEPAKOTE ER) 500 MG 24 hr tablet Take 1-2 tablets (500-1,000 mg total) by mouth 2 (two) times daily. Take 560m in morning and 1005min evening 05/16/16  Yes JoGonzella LexMD  DULoxetine (CYMBALTA) 60 MG capsule Take 1 capsule (60 mg total) by mouth daily. 05/16/16  Yes JoGonzella LexMD  esomeprazole (NEXIUM) 40 MG capsule Take 40 mg by mouth daily.  10/14/14  Yes Historical Provider, MD  fenofibrate (TRICOR) 145 MG tablet Take 145 mg by mouth daily.  12/23/15 12/22/16 Yes Historical Provider, MD  furosemide (LASIX) 40 MG tablet Take 1 tablet (40 mg total) by mouth daily. 09/25/15  Yes Richard WiLeslye PeerMD  glucose 4 GM chewable tablet Chew 1 tablet by mouth as needed for low blood sugar.   Yes Historical  Provider, MD  Insulin Disposable Pump (V-GO 30) KIT Inject 0.8 Units into the skin every hour. Reported on 06/29/2015 10/07/14  Yes Historical Provider, MD  isosorbide mononitrate (IMDUR) 30 MG 24 hr tablet Take 1 tablet (30 mg total) by mouth daily. Patient taking differently: Take 60 mg by mouth daily.  10/12/15  Yes VaVaughan BastaMD  levothyroxine (SYNTHROID, LEVOTHROID) 50 MCG tablet Take 50 mcg by mouth daily before breakfast.   Yes Historical Provider, MD  metFORMIN (GLUCOPHAGE) 1000 MG tablet Take 1,000 mg by mouth 2 (two) times daily with a meal.   Yes Historical Provider, MD  metoprolol succinate (TOPROL-XL) 50 MG 24 hr tablet Take 50 mg by mouth daily.  09/11/14  Yes Historical Provider, MD  nitroGLYCERIN (NITROSTAT) 0.4 MG SL tablet Place 0.4 mg under the tongue every 5 (five) minutes x 3  doses as needed for chest pain.    Yes Historical Provider, MD  pregabalin (LYRICA) 100 MG capsule Take 100 mg by mouth 3 (three) times daily.   Yes Historical Provider, MD  rOPINIRole (REQUIP) 1 MG tablet Take 1 mg by mouth 2 (two) times daily.  04/27/15  Yes Historical Provider, MD  rosuvastatin (CRESTOR) 20 MG tablet Take 20 mg by mouth daily.   Yes Historical Provider, MD  VICTOZA 18 MG/3ML SOPN Inject 1.8 mg into the skin daily.  09/07/14  Yes Historical Provider, MD  vitamin C (ASCORBIC ACID) 500 MG tablet Take 500 mg by mouth daily.   Yes Historical Provider, MD  zolpidem (AMBIEN) 10 MG tablet Take 10 mg by mouth at bedtime as needed for sleep.   Yes Historical Provider, MD      VITAL SIGNS:  Blood pressure (!) 161/70, pulse 70, temperature 97.6 F (36.4 C), temperature source Oral, resp. rate 16, weight 128.4 kg (283 lb), SpO2 100 %.  PHYSICAL EXAMINATION:   Physical Exam  GENERAL:  66 y.o.-year-old obese patient sitting in the bed with no acute distress.  EYES: Pupils equal, round, reactive to light and accommodation. No scleral icterus. Extraocular muscles intact.  HEENT: Head  atraumatic, normocephalic. Oropharynx and nasopharynx clear.  NECK:  Supple, no jugular venous distention. No thyroid enlargement, no tenderness.  LUNGS: Normal breath sounds bilaterally, no wheezing, rales,rhonchi or crepitation. No use of accessory muscles of respiration.  CARDIOVASCULAR: S1, S2 normal. No rubs, or gallops. Soft systolic murmur on auscultation ABDOMEN: Soft, nontender, nondistended. Bowel sounds present. No organomegaly or mass.  EXTREMITIES: No pedal edema, cyanosis, or clubbing.  NEUROLOGIC: Cranial nerves II through XII are intact. Muscle strength 5/5 in all extremities. Sensation intact. Gait not checked.  PSYCHIATRIC: The patient is alert and oriented x 3.  SKIN: No obvious rash, lesion, or ulcer.   LABORATORY PANEL:   CBC  Recent Labs Lab 05/31/16 0420  WBC 8.1  HGB 11.6*  HCT 34.0*  PLT 267   ------------------------------------------------------------------------------------------------------------------  Chemistries   Recent Labs Lab 05/31/16 0420  NA 134*  K 4.3  CL 102  CO2 22  GLUCOSE 306*  BUN 32*  CREATININE 1.26*  CALCIUM 9.3   ------------------------------------------------------------------------------------------------------------------  Cardiac Enzymes  Recent Labs Lab 05/31/16 0750  TROPONINI 0.89*   ------------------------------------------------------------------------------------------------------------------  RADIOLOGY:  Dg Chest Port 1 View  Result Date: 05/31/2016 CLINICAL DATA:  Chest pain.  Sinus tachycardia. EXAM: PORTABLE CHEST 1 VIEW COMPARISON:  03/12/2016 FINDINGS: Cardiac enlargement. No vascular congestion or edema. No blunting of costophrenic angles. No pneumothorax. No focal consolidation in the lungs. IMPRESSION: Cardiac enlargement.  No evidence of active pulmonary disease. Electronically Signed   By: Lucienne Capers M.D.   On: 05/31/2016 05:10    EKG:   Orders placed or performed during the hospital  encounter of 05/31/16  . EKG 12-Lead  . EKG 12-Lead  . ED EKG  . ED EKG    IMPRESSION AND PLAN:   Uday Jantz  is a 66 y.o. male with a known history of  CAD status post stents, the last one in September 2017, insulin-dependent diabetes mellitus, hypertension, GERD, depression and anxiety, congestive heart failure, sleep apnea presents to the hospital secondary to chest pressure and palpitations that started post midnight.  #1 Afib with rvr-  New onset, but converted back to NSR within few minutes Monitor on tele, cards consult -continue metoprolol - long term anticoagulation to be discussed by cardiologist - on asa  #2  Elevated troponin- demand ischemia from elevated heart rate, vs NSTEMI - monitor on tele -ECHO ordered, cards consulted - Troponins are recycled, and tinea cardiac medications. -If further elevation of troponins noted, keep nothing by mouth after midnight and change Lovenox to therapeutic dose  #3 CAD s/p stents- monitor troponins, CEHO - cont asa, plavix, statin, metoprolol and Imdur  #4 IDDM- a1c pending. On insulin pump- not using here- started on lantus and SSI per diabetes coordinator input Monitor  #5 HTN- continue home meds- norvasc, metoprolol and imdur  #6 Bipolar disorder- stable on cymbalta and depakote. H/o suicide attempt in past when separated from wife.  #7 GERD-PPI  #8 DVT prophylaxis- lovenox    All the records are reviewed and case discussed with ED provider. Management plans discussed with the patient, family and they are in agreement.  CODE STATUS: Full Code  TOTAL TIME TAKING CARE OF THIS PATIENT: 50 minutes.    Gladstone Lighter M.D on 05/31/2016 at 1:26 PM  Between 7am to 6pm - Pager - [phone]  After 6pm go to [email or web address] - password [email or web address] [facility]  Office  [phone]  CC: Primary care physician; Lavera Guise, MD

## 2016-05-31 NOTE — ED Provider Notes (Addendum)
[facility] Emergency Department Provider Note   First MD Initiated Contact with Patient 05/31/16 [phone]     (approximate)  I have reviewed the triage vital signs and the nursing notes.   HISTORY  Chief Complaint Chest Pain   HPI Dennis Mullen is a 66 y.o. male with bolus of chronic medical conditions including 6 cardiac stents presents to the emergency department with 2 hour history of central chest pain now release status post subungual nitroglycerin. Patient states that his heart has been racing and feels irregular. Patient admits to being out of his metoprolol for the past few days.   Past Medical History:  Diagnosis Date  . Abnormal levels of other serum enzymes   . Anxiety   . Bipolar disorder ([place])   . BPH (benign prostatic hyperplasia)   . CAD (coronary artery disease)   . CHF (congestive heart failure) ([place])    ?  Marland Kitchen Depression   . Diabetes mellitus, type II ([place])   . Diabetes mellitus, type II, insulin dependent ([place])   . ED (erectile dysfunction)   . GERD (gastroesophageal reflux disease)   . Gout   . History of borderline personality disorder   . Hypertension   . Hypogonadism in male   . Insomnia   . Mood disorder ([place])   . Neuropathy ([place])   . Obesity   . OSA (obstructive sleep apnea)   . PVD (peripheral vascular disease) ([place])   . Thyroid disease     Patient Active Problem List   Diagnosis Date Noted  . Lymphedema 03/30/2016  . Left leg cellulitis 03/12/2016  . Insulin overdose 02/02/2016  . Hypoglycemia   . OSA on CPAP   . Depression   . Hypophosphatemia   . SOB (shortness of breath) 10/11/2015  . Elevated troponin 10/11/2015  . Chest pain at rest 09/24/2015  . Elevated PSA 07/26/2015  . Hypogonadism in male 07/26/2015  . 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 BPH with obstruction/lower urinary tract symptoms 06/29/2015  . Bipolar 1 disorder, mixed ([place]) 04/14/2015  . Borderline personality  disorder 04/14/2015  . Acute renal failure ([place]) 11/15/2014  . Chest pain 11/14/2014  . Dizziness 11/05/2014  . Benign essential HTN 11/04/2014  . Combined fat and carbohydrate induced hyperlipemia 11/04/2014  . Bipolar 2 disorder ([place]) 10/27/2014  . Acquired hypothyroidism 02/03/2014  . Type 2 diabetes mellitus ([place]) 02/03/2014  . Adiposity 01/06/2014  . Obesity, diabetes, and hypertension syndrome ([place]) 12/23/2013  . Long term current use of insulin ([place]) 12/23/2013  . Type 2 diabetes mellitus with other diabetic neurological complication 14/48/1856  . Disorder affecting the body's metabolism 12/23/2013  . Acute inflammation of the pancreas 11/18/2013  . Elevated cholesterol with elevated triglycerides 11/18/2013  . CAD (coronary artery disease), native coronary artery 09/26/2013  . Diabetes mellitus ([place]) 09/26/2013  . Acute non-ST elevation myocardial infarction (NSTEMI) ([place]) 09/26/2013  . Non-ST elevation (NSTEMI) myocardial infarction [facility]) 09/26/2013    Past Surgical History:  Procedure Laterality Date  . CARDIAC CATHETERIZATION N/A 10/12/2015   Procedure: Left Heart Cath and Coronary Angiography;  Surgeon: Teodoro Spray, MD;  Location: [facility];  Service: Cardiovascular;  Laterality: N/A;  . CORONARY ANGIOPLASTY WITH STENT PLACEMENT     x5 stents  . FOOT SURGERY    . MASTOIDECTOMY Right   . NASAL SEPTUM SURGERY    . penial inplant    . PENILE PROSTHESIS IMPLANT    . right mastoidectomy    .  SCROTOPLASTY    . TONSILLECTOMY AND ADENOIDECTOMY      Prior to Admission medications   Medication Sig Start Date End Date Taking? Authorizing Provider  allopurinol (ZYLOPRIM) 100 MG tablet Take 100 mg by mouth every morning.    Historical Provider, MD  amLODipine (NORVASC) 5 MG tablet Take 5 mg by mouth daily.  10/14/14   Historical Provider, MD  aspirin EC 81 MG tablet Take 81 mg by mouth every morning. Reported on 07/26/2015    Historical Provider, MD  clopidogrel  (PLAVIX) 75 MG tablet Take 75 mg by mouth every morning. Reported on 07/26/2015    Historical Provider, MD  colchicine 0.6 MG tablet  11/08/15   Historical Provider, MD  diclofenac (VOLTAREN) 75 MG EC tablet Take 75 mg by mouth 3 times/day as needed-between meals & bedtime.  12/09/15   Historical Provider, MD  divalproex (DEPAKOTE ER) 500 MG 24 hr tablet Take 1-2 tablets (500-1,000 mg total) by mouth 2 (two) times daily. Take 521m in morning and 10093min evening 05/16/16   JoGonzella LexMD  DULoxetine (CYMBALTA) 60 MG capsule Take 1 capsule (60 mg total) by mouth daily. 05/16/16   JoGonzella LexMD  esomeprazole (NEXIUM) 40 MG capsule Take 40 mg by mouth daily.  10/14/14   Historical Provider, MD  fenofibrate (TRICOR) 145 MG tablet Take by mouth. 12/23/15 12/22/16  Historical Provider, MD  furosemide (LASIX) 40 MG tablet Take 1 tablet (40 mg total) by mouth daily. 09/25/15   RiLoletha GrayerMD  glucose 4 GM chewable tablet Chew 1 tablet by mouth as needed for low blood sugar.    Historical Provider, MD  insulin aspart (NOVOLOG) 100 UNIT/ML injection Inject 12-20 Units into the skin 3 (three) times daily with meals. Reported on 10/11/2015    Historical Provider, MD  Insulin Disposable Pump (V-GO 30) KIT Reported on 06/29/2015 10/07/14   Historical Provider, MD  isosorbide mononitrate (IMDUR) 30 MG 24 hr tablet Take 1 tablet (30 mg total) by mouth daily. Patient taking differently: Take 60 mg by mouth daily.  10/12/15   VaVaughan BastaMD  levothyroxine (SYNTHROID, LEVOTHROID) 50 MCG tablet Take 50 mcg by mouth daily before breakfast.    Historical Provider, MD  metFORMIN (GLUCOPHAGE) 1000 MG tablet Take 1,000 mg by mouth 2 (two) times daily with a meal.    Historical Provider, MD  metoprolol succinate (TOPROL-XL) 50 MG 24 hr tablet Take 50 mg by mouth daily.  09/11/14   Historical Provider, MD  nitroGLYCERIN (NITROSTAT) 0.4 MG SL tablet Place 0.4 mg under the tongue every 5 (five) minutes x 3 doses as  needed for chest pain.     Historical Provider, MD  pregabalin (LYRICA) 100 MG capsule Take 100 mg by mouth 3 (three) times daily.    Historical Provider, MD  rOPINIRole (REQUIP) 1 MG tablet Take 1 mg by mouth 2 (two) times daily.  04/27/15   Historical Provider, MD  rosuvastatin (CRESTOR) 20 MG tablet Take 20 mg by mouth daily.    Historical Provider, MD  sulfamethoxazole-trimethoprim (BACTRIM DS,SEPTRA DS) 800-160 MG tablet Take 1 tablet by mouth every 12 (twelve) hours. 03/13/16   SoFritzi MandesMD  VICTOZA 18 MG/3ML SOPN Inject 1.8 mg into the skin daily.  09/07/14   Historical Provider, MD  vitamin C (ASCORBIC ACID) 500 MG tablet Take 500 mg by mouth daily.    Historical Provider, MD    Allergies Patient has no known allergies.  Family History  Problem  Relation Age of Onset  . Lung cancer Mother   . Heart attack Father   . Post-traumatic stress disorder Father   . Anxiety disorder Father   . Depression Father   . Heart attack Sister   . Heart attack Brother     Social History Social History  Substance Use Topics  . Smoking status: Never Smoker  . Smokeless tobacco: Never Used  . Alcohol use No    Review of Systems Constitutional: No fever/chills Eyes: No visual changes. ENT: No sore throat. Cardiovascular:Positive for chest pain or palpitation Respiratory: Denies shortness of breath. Gastrointestinal: No abdominal pain.  No nausea, no vomiting.  No diarrhea.  No constipation. Genitourinary: Negative for dysuria. Musculoskeletal: Negative for back pain. Skin: Negative for rash. Neurological: Negative for headaches, focal weakness or numbness.  10-point ROS otherwise negative.  ____________________________________________   PHYSICAL EXAM:  VITAL SIGNS: ED Triage Vitals  Enc Vitals Group     BP 05/31/16 0416 135/88     Pulse Rate 05/31/16 0417 (!) 152     Resp 05/31/16 0417 14     Temp --      Temp src --      SpO2 05/31/16 0417 99 %     Weight 05/31/16 0419  283 lb (128.4 kg)     Height --      Head Circumference --      Peak Flow --      Pain Score 05/31/16 0419 5     Pain Loc --      Pain Edu? --      Excl. in [place]? --     Constitutional: Alert and oriented. Well appearing and in no acute distress. Eyes: Conjunctivae are normal. PERRL. EOMI. Head: Atraumatic. Mouth/Throat: Mucous membranes are moist. Neck: No stridor.   Cardiovascular: Irregular regular rhythm. Good peripheral circulation. Grossly normal heart sounds. Respiratory: Normal respiratory effort.  No retractions. Lungs CTAB. Gastrointestinal: Soft and nontender. No distention.  Musculoskeletal: No lower extremity tenderness nor edema. No gross deformities of extremities. Neurologic:  Normal speech and language. No gross focal neurologic deficits are appreciated.  Skin:  Skin is warm, dry and intact. No rash noted.   ____________________________________________   LABS (all labs ordered are listed, but only abnormal results are displayed)  Labs Reviewed  BASIC METABOLIC PANEL - Abnormal; Notable for the following:       Result Value   Sodium 134 (*)    Glucose, Bld 306 (*)    BUN 32 (*)    Creatinine, Ser 1.26 (*)    GFR calc non Af Amer 59 (*)    All other components within normal limits  CBC - Abnormal; Notable for the following:    RBC 4.03 (*)    Hemoglobin 11.6 (*)    HCT 34.0 (*)    RDW 18.5 (*)    All other components within normal limits  TROPONIN I - Abnormal; Notable for the following:    Troponin I 0.07 (*)    All other components within normal limits   ____________________________________________  EKG  ED ECG REPORT I, [place] Shanavia Makela, the attending physician, personally viewed and interpreted this ECG.   Date: 05/31/2016  EKG Time: 4:18 AM  Rate: 151  Rhythm: Atrial fibrillation with rapid ventricular response  Axis: Normal  Intervals: Irregular PR interval  ST&T Change:  None  ____________________________________________  RADIOLOGY I, [place] Alexee Delsanto, personally viewed and evaluated these images (plain radiographs) as part of my medical decision making,  as well as reviewing the written report by the radiologist.  Dg Chest Port 1 View  Result Date: 05/31/2016 CLINICAL DATA:  Chest pain.  Sinus tachycardia. EXAM: PORTABLE CHEST 1 VIEW COMPARISON:  03/12/2016 FINDINGS: Cardiac enlargement. No vascular congestion or edema. No blunting of costophrenic angles. No pneumothorax. No focal consolidation in the lungs. IMPRESSION: Cardiac enlargement.  No evidence of active pulmonary disease. Electronically Signed   By: Lucienne Capers M.D.   On: 05/31/2016 05:10    _ Procedures      INITIAL IMPRESSION / ASSESSMENT AND PLAN / ED COURSE  Pertinent labs & imaging results that were available during my care of the patient were reviewed by me and considered in my medical decision making (see chart for details).  Patient presented with atrial fibrillation with rapid ventricular response as such was given diltiazem 10 mg IV. Current heart rate 70 bpm sinus rhythm. Patient did admit to chest pain at home which had resolved status post sublingual nitroglycerin. Initial troponin 0.07. Patient discussed with Dr. Marcille Blanco for hospital admission further evaluation and management      ____________________________________________  FINAL CLINICAL IMPRESSION(S) / ED DIAGNOSES  Final diagnoses:  New onset atrial fibrillation ([place])  Atrial fibrillation with rapid ventricular response ([facility])  Elevated troponin     MEDICATIONS GIVEN DURING THIS VISIT:  Medications  diltiazem (CARDIZEM) injection 10 mg (10 mg Intravenous Given 05/31/16 0424)     NEW OUTPATIENT MEDICATIONS STARTED DURING THIS VISIT:  New Prescriptions   No medications on file    Modified Medications   No medications on file    Discontinued Medications   No medications on file     Note:   This document was prepared using Dragon voice recognition software and may include unintentional dictation errors.    Gregor Hams, MD 05/31/16 [place], MD 05/31/16 [phone]

## 2016-05-31 NOTE — ED Notes (Signed)
This Rn went and got EDP due to patients heart rate being 152 bpm.  EDP at bedside.

## 2016-05-31 NOTE — Progress Notes (Signed)
*  PRELIMINARY RESULTS* Echocardiogram 2D Echocardiogram has been performed.  Sherrie Sport 05/31/2016, 2:36 PM

## 2016-05-31 NOTE — ED Notes (Signed)
Patient sitting on end of stretcher. Patient states he feels more comfortable that way. Patient said he feels restless.

## 2016-05-31 NOTE — ED Notes (Signed)
Gave patient diet ginger ale. Patient working cross word puzzle.

## 2016-05-31 NOTE — Consult Note (Signed)
[facility]  CARDIOLOGY CONSULT NOTE  Patient ID: Dennis Mullen MRN: [id number] DOB/AGE: 09/24/1950 66 y.o. y.o.  Admit date: 05/31/2016 Referring Physician Kalisetti  Primary Physician Khan, Fozia Primary Cardiologist Kowalski Reason for Consultation Chest pain, new onset atrial fibrillation  HPI: 66-year-old male referred for chest pain and new onset atrial fibrillation. Patient has a extensive CAD history, status post multiple non-STEMI and multiple stents, with the most recent in 12/2015 with a DES to mid LAD at [facility] (previous cath in 09/2015 revealed patent RCA stent, patent stent of proximal left circumflex, 60% stenosis of LAD after 2nd diagonal with diffusely diseased distal LAD), CHF with preserved EF , essential hypertension, insulin-dependent diabetes mellitus, and OSA. Patient presented to [facility] ER on 05/30/2016 for acute onset of chest pain with shortness of breath that woke him up that night. He received aspirin and nitroglycerin per EMS with resolution of chest pain without recurrence. He states it felt similar, but less severe, to a previous MI. In the ER , patient was noted to be in new-onset atrial fibrillation with rapid ventricular response. He was given a bolus of diltiazem with conversion to sinus rhythm, which he has remained in. Admission labs notable for elevated troponin of 0.07, then 0.89, followed by 1.79. Patient continues to denie chest pain or palpitations. He has chronic left lower extremity edema. He denies presyncope or syncope. Patient has a chads vasc score of 4. He is currently on dual-antiplatelet therapy.  Review of systems complete and found to be negative unless listed above     Past Medical History:  Diagnosis Date  . Abnormal levels of other serum enzymes   . Anxiety   . Bipolar disorder (HCC)   . BPH (benign prostatic hyperplasia)   . CAD (coronary artery disease)   . CHF (congestive heart failure) (HCC)    ?  . Depression   . Diabetes mellitus,  type II (HCC)   . Diabetes mellitus, type II, insulin dependent (HCC)   . ED (erectile dysfunction)   . GERD (gastroesophageal reflux disease)   . Gout   . History of borderline personality disorder   . Hypertension   . Hypogonadism in male   . Insomnia   . Mood disorder (HCC)   . Neuropathy (HCC)   . Obesity   . OSA (obstructive sleep apnea)   . PVD (peripheral vascular disease) (HCC)   . Thyroid disease     Past Surgical History:  Procedure Laterality Date  . CARDIAC CATHETERIZATION N/A 10/12/2015   Procedure: Left Heart Cath and Coronary Angiography;  Surgeon: Kenneth A Fath, MD;  Location: [facility] INVASIVE CV LAB;  Service: Cardiovascular;  Laterality: N/A;  . CORONARY ANGIOPLASTY WITH STENT PLACEMENT     x5 stents  . FOOT SURGERY    . MASTOIDECTOMY Right   . NASAL SEPTUM SURGERY    . penial inplant    . PENILE PROSTHESIS IMPLANT    . right mastoidectomy    . SCROTOPLASTY    . TONSILLECTOMY AND ADENOIDECTOMY      Prescriptions Prior to Admission  Medication Sig Dispense Refill Last Dose  . acidophilus (RISAQUAD) CAPS capsule Take 1 capsule by mouth daily.   05/30/2016 at Unknown time  . allopurinol (ZYLOPRIM) 100 MG tablet Take 100 mg by mouth every morning.   05/30/2016 at Unknown time  . amLODipine (NORVASC) 5 MG tablet Take 5 mg by mouth at bedtime.    05/30/2016 at Unknown time  . aspirin EC 81 MG tablet Take   81 mg by mouth every morning. Reported on 07/26/2015   05/30/2016 at Unknown time  . clopidogrel (PLAVIX) 75 MG tablet Take 75 mg by mouth every morning. Reported on 07/26/2015   05/30/2016 at Unknown time  . colchicine 0.6 MG tablet Take 0.6 mg by mouth 2 (two) times daily as needed (gout flare).    prn at prn  . diclofenac (VOLTAREN) 75 MG EC tablet Take 75 mg by mouth daily as needed (gout flare).    prn at prn  . divalproex (DEPAKOTE ER) 500 MG 24 hr tablet Take 1-2 tablets (500-1,000 mg total) by mouth 2 (two) times daily. Take 511m in morning and 10061min evening 270  tablet 1 05/30/2016 at Unknown time  . DULoxetine (CYMBALTA) 60 MG capsule Take 1 capsule (60 mg total) by mouth daily. 90 capsule 1 05/30/2016 at Unknown time  . esomeprazole (NEXIUM) 40 MG capsule Take 40 mg by mouth daily.    05/30/2016 at Unknown time  . fenofibrate (TRICOR) 145 MG tablet Take 145 mg by mouth daily.    05/30/2016 at Unknown time  . furosemide (LASIX) 40 MG tablet Take 1 tablet (40 mg total) by mouth daily. 30 tablet 0 05/30/2016 at Unknown time  . glucose 4 GM chewable tablet Chew 1 tablet by mouth as needed for low blood sugar.   prn at prn  . Insulin Disposable Pump (V-GO 30) KIT Inject 0.8 Units into the skin every hour. Reported on 06/29/2015   Past Week at Unknown time  . isosorbide mononitrate (IMDUR) 30 MG 24 hr tablet Take 1 tablet (30 mg total) by mouth daily. (Patient taking differently: Take 60 mg by mouth daily. ) 30 tablet 0 05/30/2016 at Unknown time  . levothyroxine (SYNTHROID, LEVOTHROID) 50 MCG tablet Take 50 mcg by mouth daily before breakfast.   05/30/2016 at Unknown time  . metFORMIN (GLUCOPHAGE) 1000 MG tablet Take 1,000 mg by mouth 2 (two) times daily with a meal.   05/30/2016 at Unknown time  . metoprolol succinate (TOPROL-XL) 50 MG 24 hr tablet Take 50 mg by mouth daily.    Past Week at Unknown time  . nitroGLYCERIN (NITROSTAT) 0.4 MG SL tablet Place 0.4 mg under the tongue every 5 (five) minutes x 3 doses as needed for chest pain.    prn at prn  . pregabalin (LYRICA) 100 MG capsule Take 100 mg by mouth 3 (three) times daily.   05/30/2016 at Unknown time  . rOPINIRole (REQUIP) 1 MG tablet Take 1 mg by mouth 2 (two) times daily.    05/30/2016 at Unknown time  . rosuvastatin (CRESTOR) 20 MG tablet Take 20 mg by mouth daily.   05/30/2016 at Unknown time  . VICTOZA 18 MG/3ML SOPN Inject 1.8 mg into the skin daily.    05/30/2016 at Unknown time  . vitamin C (ASCORBIC ACID) 500 MG tablet Take 500 mg by mouth daily.   05/30/2016 at Unknown time  . zolpidem (AMBIEN) 10 MG  tablet Take 10 mg by mouth at bedtime as needed for sleep.   prn at prn   Social History   Social History  . Marital status: Single    Spouse name: N/A  . Number of children: N/A  . Years of education: N/A   Occupational History  . retired    Social History Main Topics  . Smoking status: Never Smoker  . Smokeless tobacco: Never Used  . Alcohol use No  . Drug use: No  . Sexual activity: Not  Currently    Birth control/ protection: None   Other Topics Concern  . Not on file   Social History Narrative   ** Merged History Encounter **        Family History  Problem Relation Age of Onset  . Lung cancer Mother   . Heart attack Father   . Post-traumatic stress disorder Father   . Anxiety disorder Father   . Depression Father   . Heart attack Sister   . Heart attack Brother       Review of systems complete and found to be negative unless listed above      PHYSICAL EXAM  General: Well developed, well nourished, in no acute distress HEENT:  Normocephalic and atramatic Neck:  No JVD.  Lungs: Clear bilaterally to auscultation and percussion. Heart: HRRR . Normal S1 and S2. 2/6 systolic murmur with radiation to clavicles Abdomen: Bowel sounds are positive, abdomen soft and non-tender  Msk:  Back normal, normal gait. Normal strength and tone for age. Extremities: No clubbing, cyanosis. L lower leg edema.   Neuro: Alert and oriented X 3. Psych:  Good affect, responds appropriately  Labs:   Lab Results  Component Value Date   WBC 8.1 05/31/2016   HGB 11.6 (L) 05/31/2016   HCT 34.0 (L) 05/31/2016   MCV 84.4 05/31/2016   PLT 267 05/31/2016    Recent Labs Lab 05/31/16 0420  NA 134*  K 4.3  CL 102  CO2 22  BUN 32*  CREATININE 1.26*  CALCIUM 9.3  GLUCOSE 306*   Lab Results  Component Value Date   CKTOTAL 249 08/15/2013   CKMB 7.8 (H) 08/15/2013   TROPONINI 1.79 (HH) 05/31/2016    Lab Results  Component Value Date   CHOL 176 10/11/2015   CHOL 152  09/24/2015   CHOL 223 (H) 01/29/2014   Lab Results  Component Value Date   HDL 45 10/11/2015   HDL 48 09/24/2015   HDL 34 (L) 01/29/2014   Lab Results  Component Value Date   LDLCALC 89 10/11/2015   LDLCALC 46 09/24/2015   LDLCALC SEE COMMENT 01/29/2014   Lab Results  Component Value Date   TRIG 211 (H) 10/11/2015   TRIG 291 (H) 09/24/2015   TRIG 474 (H) 01/29/2014   Lab Results  Component Value Date   CHOLHDL 3.9 10/11/2015   CHOLHDL 3.2 09/24/2015   No results found for: LDLDIRECT    Radiology: Dg Chest Port 1 View  Result Date: 05/31/2016 CLINICAL DATA:  Chest pain.  Sinus tachycardia. EXAM: PORTABLE CHEST 1 VIEW COMPARISON:  03/12/2016 FINDINGS: Cardiac enlargement. No vascular congestion or edema. No blunting of costophrenic angles. No pneumothorax. No focal consolidation in the lungs. IMPRESSION: Cardiac enlargement.  No evidence of active pulmonary disease. Electronically Signed   By: William  Stevens M.D.   On: 05/31/2016 05:10    EKG: normal sinus rhythm, 66 bpm  ASSESSMENT AND PLAN:  1. Known coronary artery disease status post non-STEMI in 12/2015 with PCI of mid-LAD with DES at [facility], currently with elevated troponin of 1.79 in the absence of chest pain. 2. New-onset atrial fibrillation with a chads vasc score of 4, currently in sinus rhythm after a bolus of diltiazem, currently on dual-antiplatelet therapy.  Recommendations: 1. Agree with current therapy 2. Review echocardiogram results 3. Spoke to patient extensively about the risks and benefits of proceeding with cardiac catheterization to assess patency of most recent stent, which will help guide anticoagulation therapy. If stents are patent   and no there is no evidence of further blockage, will likely discontinue aspirin, continue Plavix, and initiate Eliquis for stroke prevention.     Signed: Clabe Seal, PA-C 05/31/2016, 3:27 PM

## 2016-06-01 ENCOUNTER — Encounter
Admission: EM | Disposition: A | Discharge status: Home / Self Care | Payer: Self-pay | Source: Home / Self Care | Attending: Emergency Medicine

## 2016-06-01 DIAGNOSIS — I214 Non-ST elevation (NSTEMI) myocardial infarction: Secondary | ICD-10-CM | POA: Diagnosis not present

## 2016-06-01 DIAGNOSIS — I350 Nonrheumatic aortic (valve) stenosis: Secondary | ICD-10-CM | POA: Diagnosis not present

## 2016-06-01 DIAGNOSIS — E119 Type 2 diabetes mellitus without complications: Secondary | ICD-10-CM | POA: Diagnosis not present

## 2016-06-01 DIAGNOSIS — I10 Essential (primary) hypertension: Secondary | ICD-10-CM | POA: Diagnosis not present

## 2016-06-01 DIAGNOSIS — I2510 Atherosclerotic heart disease of native coronary artery without angina pectoris: Secondary | ICD-10-CM | POA: Diagnosis not present

## 2016-06-01 HISTORY — PX: LEFT HEART CATH AND CORONARY ANGIOGRAPHY: CATH118249

## 2016-06-01 LAB — BASIC METABOLIC PANEL
ANION GAP: 7 (ref 5–15)
BUN: 35 mg/dL — ABNORMAL HIGH (ref 6–20)
CALCIUM: 8.9 mg/dL (ref 8.9–10.3)
CO2: 23 mmol/L (ref 22–32)
Chloride: 104 mmol/L (ref 101–111)
Creatinine, Ser: 1.34 mg/dL — ABNORMAL HIGH (ref 0.61–1.24)
GFR, EST NON AFRICAN AMERICAN: 55 mL/min — AB (ref >60)
Glucose, Bld: 283 mg/dL — ABNORMAL HIGH (ref 65–99)
Potassium: 4.5 mmol/L (ref 3.5–5.1)
Sodium: 134 mmol/L — ABNORMAL LOW (ref 135–145)

## 2016-06-01 LAB — CBC
HCT: 31.8 % — ABNORMAL LOW (ref 40.0–52.0)
Hemoglobin: 10.8 g/dL — ABNORMAL LOW (ref 13.0–18.0)
MCH: 28.9 pg (ref 26.0–34.0)
MCHC: 34.1 g/dL (ref 32.0–36.0)
MCV: 84.9 fL (ref 80.0–100.0)
Platelets: 279 10*3/uL (ref 150–440)
RBC: 3.75 MIL/uL — AB (ref 4.40–5.90)
RDW: 18.3 % — ABNORMAL HIGH (ref 11.5–14.5)
WBC: 6.5 10*3/uL (ref 3.8–10.6)

## 2016-06-01 LAB — GLUCOSE, CAPILLARY
GLUCOSE-CAPILLARY: 193 mg/dL — AB (ref 65–99)
GLUCOSE-CAPILLARY: 166 mg/dL — AB (ref 65–99)
Glucose-Capillary: 225 mg/dL — ABNORMAL HIGH (ref 65–99)
Glucose-Capillary: 298 mg/dL — ABNORMAL HIGH (ref 65–99)

## 2016-06-01 LAB — HEMOGLOBIN A1C
HEMOGLOBIN A1C: 7.5 % — AB (ref 4.8–5.6)
MEAN PLASMA GLUCOSE: 169 mg/dL

## 2016-06-01 LAB — TROPONIN I: TROPONIN I: 0.89 ng/mL — AB (ref <0.03)

## 2016-06-01 LAB — PROTIME-INR
INR: 1.11
PROTHROMBIN TIME: 14.3 s (ref 11.4–15.2)

## 2016-06-01 SURGERY — LEFT HEART CATH AND CORONARY ANGIOGRAPHY
Anesthesia: Moderate Sedation | Laterality: Right

## 2016-06-01 MED ORDER — MUPIROCIN CALCIUM 2 % EX CREA
TOPICAL_CREAM | Freq: Every day | CUTANEOUS | Status: DC
  Administered 2016-06-01 – 2016-06-02 (×2): via TOPICAL
  Filled 2016-06-01 (×2): qty 15

## 2016-06-01 MED ORDER — FENTANYL CITRATE (PF) 100 MCG/2ML IJ SOLN
INTRAMUSCULAR | Status: DC | PRN
  Administered 2016-06-01: 50 ug via INTRAVENOUS

## 2016-06-01 MED ORDER — SODIUM CHLORIDE 0.9 % IV SOLN: 250.0000 mL | INTRAVENOUS | Status: DC | PRN

## 2016-06-01 MED ORDER — MIDAZOLAM HCL 2 MG/2ML IJ SOLN
INTRAMUSCULAR | Status: AC
  Filled 2016-06-01: qty 2

## 2016-06-01 MED ORDER — SODIUM CHLORIDE 0.9% FLUSH: 3.0000 mL | INTRAVENOUS | Status: DC | PRN

## 2016-06-01 MED ORDER — SODIUM CHLORIDE 0.9% FLUSH
3.0000 mL | Freq: Two times a day (BID) | INTRAVENOUS | Status: DC
  Administered 2016-06-02 (×2): 3 mL via INTRAVENOUS

## 2016-06-01 MED ORDER — SODIUM CHLORIDE 0.9 % WEIGHT BASED INFUSION
1.0000 mL/kg/h | INTRAVENOUS | Status: AC
  Administered 2016-06-01: 1 mL/kg/h via INTRAVENOUS

## 2016-06-01 MED ORDER — HEPARIN (PORCINE) IN NACL 2-0.9 UNIT/ML-% IJ SOLN
INTRAMUSCULAR | Status: AC
  Filled 2016-06-01: qty 500

## 2016-06-01 MED ORDER — ONDANSETRON HCL 4 MG/2ML IJ SOLN: 4.0000 mg | Freq: Four times a day (QID) | INTRAMUSCULAR | Status: DC | PRN

## 2016-06-01 MED ORDER — MIDAZOLAM HCL 2 MG/2ML IJ SOLN
INTRAMUSCULAR | Status: DC | PRN
  Administered 2016-06-01: 1 mg via INTRAVENOUS

## 2016-06-01 MED ORDER — ACETAMINOPHEN 325 MG PO TABS: 650.0000 mg | ORAL_TABLET | ORAL | Status: DC | PRN

## 2016-06-01 MED ORDER — IOPAMIDOL (ISOVUE-300) INJECTION 61%
INTRAVENOUS | Status: DC | PRN
  Administered 2016-06-01: 110 mL via INTRA_ARTERIAL

## 2016-06-01 MED ORDER — FENTANYL CITRATE (PF) 100 MCG/2ML IJ SOLN
INTRAMUSCULAR | Status: AC
  Filled 2016-06-01: qty 2

## 2016-06-01 SURGICAL SUPPLY — 15 items
CATH 5FR PIGTAIL DIAGNOSTIC (CATHETERS) ×2 IMPLANT
CATH INFINITI 5FR JL4 (CATHETERS) ×2 IMPLANT
CATH INFINITI JR4 5F (CATHETERS) ×2 IMPLANT
CATH SWANZ 7F THERMO (CATHETERS) ×2 IMPLANT
DEVICE CLOSURE MYNXGRIP 6/7F (Vascular Products) ×2 IMPLANT
GUIDEWIRE EMER 3M J .025X150CM (WIRE) ×2 IMPLANT
KIT MANI 3VAL PERCEP (MISCELLANEOUS) ×3 IMPLANT
KIT RIGHT HEART (MISCELLANEOUS) ×2 IMPLANT
NDL PERC 18GX7CM (NEEDLE) IMPLANT
NEEDLE PERC 18GX7CM (NEEDLE) ×3 IMPLANT
PACK CARDIAC CATH (CUSTOM PROCEDURE TRAY) ×3 IMPLANT
SHEATH AVANTI 5FR X 11CM (SHEATH) IMPLANT
SHEATH AVANTI 6FR X 11CM (SHEATH) ×2 IMPLANT
SHEATH PINNACLE 7F 10CM (SHEATH) ×2 IMPLANT
WIRE EMERALD 3MM-J .035X150CM (WIRE) ×2 IMPLANT

## 2016-06-01 NOTE — Progress Notes (Signed)
Pt returned from cath lab via bed.  RT groin Mynx closure in place, dressing dry and intact. Pulses equal bil.  No distress, denies chest pain at this time. CB in reach, SR up x 2.

## 2016-06-01 NOTE — Consult Note (Signed)
[place] Nurse wound consult note Reason for Consult:Neuropathic ulcer to left third plantar metatarsal, present on admission.   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 Callous with scabbed center.  Dry and intact.  Wound type:neuropathic Pressure Injury POA: Yes Measurement:Right foot 0.5 cm scabbed center with 0.5 cm callous present circumferentially.  Left third metatarsal 0.5 cm scabbed nonintact lesion to plantar toe.   Wound [id number] and ruddy red Drainage (amount, consistency, odor) minimal serosabguinous  No odor.  Periwound:callous to right plantar foot Dressing procedure/placement/frequency:Right foot callous.  Leave open to air.  Wash foot daily with soap and water.   Left metatarsal:  Cleanse with NS and pat gently dry.  Apply Bactroban to wound bed.  Cover with 2x2 and gauze/tape.  Change daily.   Will not follow at this time.  Please re-consult if needed.  Domenic Moras RN BSN [place] Pager [phone]

## 2016-06-01 NOTE — Plan of Care (Signed)
Problem: Safety: Goal: Ability to remain free from injury will improve Outcome: Progressing Independent in room  Problem: Pain Managment: Goal: General experience of comfort will improve Outcome: Progressing Prn medications  Problem: Tissue Perfusion: Goal: Risk factors for ineffective tissue perfusion will decrease Outcome: Progressing SQ Lovenox  Problem: Cardiac: Goal: Ability to achieve and maintain adequate cardiovascular perfusion will improve Outcome: Progressing Scheduled for cath today

## 2016-06-01 NOTE — Progress Notes (Signed)
A&O. Independent. For cardiac cath today.

## 2016-06-01 NOTE — Progress Notes (Signed)
[place] [facility] [place] NAME: Dennis Mullen    MR#:  [id number]  DATE OF BIRTH:  1951-04-15  SUBJECTIVE:  CHIEF COMPLAINT:   Chief Complaint  Patient presents with  . Chest Pain   No further chest pain.  Cath showed no significant lesions needing PCI  Rate controlled Afib  REVIEW OF SYSTEMS:    Review of Systems  Constitutional: Negative for chills and fever.  HENT: Negative for sore throat.   Eyes: Negative for blurred vision, double vision and pain.  Respiratory: Negative for cough, hemoptysis, shortness of breath and wheezing.   Cardiovascular: Negative for chest pain, palpitations, orthopnea and leg swelling.  Gastrointestinal: Negative for abdominal pain, constipation, diarrhea, heartburn, nausea and vomiting.  Genitourinary: Negative for dysuria and hematuria.  Musculoskeletal: Negative for back pain and joint pain.  Skin: Negative for rash.  Neurological: Negative for sensory change, speech change, focal weakness and headaches.  Endo/Heme/Allergies: Does not bruise/bleed easily.  Psychiatric/Behavioral: Negative for depression. The patient is not nervous/anxious.     DRUG ALLERGIES:  No Known Allergies  VITALS:  Blood pressure (!) 160/72, pulse (!) 52, temperature 97.4 F (36.3 C), temperature source Oral, resp. rate 16, height 6\' 2"  (1.88 m), weight 130.2 kg (287 lb), SpO2 100 %.  PHYSICAL EXAMINATION:   Physical Exam  GENERAL:  66 y.o.-year-old patient lying in the bed with no acute distress.  EYES: Pupils equal, round, reactive to light and accommodation. No scleral icterus. Extraocular muscles intact.  HEENT: Head atraumatic, normocephalic. Oropharynx and nasopharynx clear.  NECK:  Supple, no jugular venous distention. No thyroid enlargement, no tenderness.  LUNGS: Normal breath sounds bilaterally, no wheezing, rales, rhonchi. No use of accessory muscles of respiration.  CARDIOVASCULAR: Irregularly  irregular ABDOMEN: Soft, nontender, nondistended. Bowel sounds present. No organomegaly or mass.  EXTREMITIES: No cyanosis, clubbing or edema b/l.    NEUROLOGIC: Cranial nerves II through XII are intact. No focal Motor or sensory deficits b/l.   PSYCHIATRIC: The patient is alert and oriented x 3.  SKIN: No obvious rash, lesion, or ulcer.   LABORATORY PANEL:   CBC  Recent Labs Lab 06/01/16 0025  WBC 6.5  HGB 10.8*  HCT 31.8*  PLT 279   ------------------------------------------------------------------------------------------------------------------ Chemistries   Recent Labs Lab 06/01/16 0025  NA 134*  K 4.5  CL 104  CO2 23  GLUCOSE 283*  BUN 35*  CREATININE 1.34*  CALCIUM 8.9   ------------------------------------------------------------------------------------------------------------------  Cardiac Enzymes  Recent Labs Lab 06/01/16 0025  TROPONINI 0.89*   ------------------------------------------------------------------------------------------------------------------  RADIOLOGY:  Dg Chest Port 1 View  Result Date: 05/31/2016 CLINICAL DATA:  Chest pain.  Sinus tachycardia. EXAM: PORTABLE CHEST 1 VIEW COMPARISON:  03/12/2016 FINDINGS: Cardiac enlargement. No vascular congestion or edema. No blunting of costophrenic angles. No pneumothorax. No focal consolidation in the lungs. IMPRESSION: Cardiac enlargement.  No evidence of active pulmonary disease. Electronically Signed   By: Lucienne Capers M.D.   On: 05/31/2016 05:10     ASSESSMENT AND PLAN:   * Angina, chronic Cath with no significant lesions amenable to PCI Likely has small vessel disease. On asa and plavix with statin and BB.  Last stent >6 months Will need to stop ASA to start Eliquis for Afib.  * New onset Afib. Rate controlled Will start Eliquis as he just got cath today. Stop aspirin and continue plavix  * HTN Continue home meds  Discussed with Dr. Stephani Police.  All the records are  reviewed and case  discussed with Care Management/Social Workerr. Management plans discussed with the patient, family and they are in agreement.  CODE STATUS: FULL CODE  DVT Prophylaxis: SCDs  TOTAL TIME TAKING CARE OF THIS PATIENT: 30 minutes.   POSSIBLE D/C IN 1-2 DAYS, DEPENDING ON CLINICAL CONDITION.  Hillary Bow R M.D on 06/01/2016 at 5:30 PM  Between 7am to 6pm - Pager - [phone]  After 6pm go to [email or web address] - password [email or web address] [facility]  Office  [phone]  CC: Primary care physician; Lavera Guise, MD  Note: This dictation was prepared with Dragon dictation along with smaller phrase technology. Any transcriptional errors that result from this process are unintentional.

## 2016-06-01 NOTE — Progress Notes (Signed)
Inpatient Diabetes Program Recommendations  AACE/ADA: New Consensus Statement on Inpatient Glycemic Control (2015)  Target Ranges:  Prepandial:   less than 140 mg/dL      Peak postprandial:   less than 180 mg/dL (1-2 hours)      Critically ill patients:  140 - 180 mg/dL   Lab Results  Component Value Date   GLUCAP 225 (H) 06/01/2016   HGBA1C 7.5 (H) 05/31/2016    Review of Glycemic Control      Review of Glycemic Control  Diabetes history: DM2 Outpatient Diabetes medications: V-Go 30 insulin pump (provides 30 units of basal insulin; and uses additional insulin for meals per pump) with Novolog insulin, Metformin 1000 mg BID, Victoza 1.8 mg daily Current orders for Inpatient glycemic control: V-Go 30 insulin pump, Metformin 1000 mg BID, Victoza 1.8 mg daily (all orders signed and held at this time)  Inpatient Diabetes Program Recommendations: Please increase Lantus to  30 units Q24H starting now. (patient uses basal 30 units at home in his pump) Please consider ordering Novolog 4 units tid with meals- continue Novolog correction as ordered.   Spoke to patient by phone this am- he tells me he generally takes 2-4 pushes of his pump for meals, depending on the size of the meal.  This is equivalent to 4-8 units of Novolog insulin per meal since each push of the button delivers 2 units.   Gentry Fitz, RN, BA, MHA, CDE Diabetes Coordinator Inpatient Diabetes Program  [phone] (Team Pager) [phone] ([place]) 06/01/2016 8:37 AM

## 2016-06-01 NOTE — Progress Notes (Signed)
To cath lab via bed.

## 2016-06-02 ENCOUNTER — Encounter: Payer: Self-pay | Admitting: Cardiology

## 2016-06-02 DIAGNOSIS — I4891 Unspecified atrial fibrillation: Secondary | ICD-10-CM | POA: Diagnosis not present

## 2016-06-02 DIAGNOSIS — I214 Non-ST elevation (NSTEMI) myocardial infarction: Secondary | ICD-10-CM | POA: Diagnosis not present

## 2016-06-02 DIAGNOSIS — I2510 Atherosclerotic heart disease of native coronary artery without angina pectoris: Secondary | ICD-10-CM | POA: Diagnosis not present

## 2016-06-02 DIAGNOSIS — E119 Type 2 diabetes mellitus without complications: Secondary | ICD-10-CM | POA: Diagnosis not present

## 2016-06-02 DIAGNOSIS — I10 Essential (primary) hypertension: Secondary | ICD-10-CM | POA: Diagnosis not present

## 2016-06-02 LAB — GLUCOSE, CAPILLARY
GLUCOSE-CAPILLARY: 171 mg/dL — AB (ref 65–99)
GLUCOSE-CAPILLARY: 270 mg/dL — AB (ref 65–99)

## 2016-06-02 MED ORDER — APIXABAN 5 MG PO TABS: 5.0000 mg | ORAL_TABLET | Freq: Two times a day (BID) | ORAL | Status: DC

## 2016-06-02 MED ORDER — APIXABAN 5 MG PO TABS: 5.0000 mg | ORAL_TABLET | Freq: Two times a day (BID) | ORAL | 0 refills | Status: AC

## 2016-06-02 NOTE — Discharge Instructions (Addendum)
Resume diet and activity as before ° °Information on my medicine - ELIQUIS® (apixaban) ° °This medication education was reviewed with me or my healthcare representative as part of my discharge preparation.  The pharmacist that spoke with me during my hospital stay was:  Caylee Vlachos D Kourtlynn Trevor, RPH ° °Why was Eliquis® prescribed for you? °Eliquis® was prescribed for you to reduce the risk of a blood clot forming that can cause a stroke if you have a medical condition called atrial fibrillation (a type of irregular heartbeat). ° °What do You need to know about Eliquis® ? °Take your Eliquis® TWICE DAILY - one tablet in the morning and one tablet in the evening with or without food. If you have difficulty swallowing the tablet whole please discuss with your pharmacist how to take the medication safely. ° °Take Eliquis® exactly as prescribed by your doctor and DO NOT stop taking Eliquis® without talking to the doctor who prescribed the medication.  Stopping may increase your risk of developing a stroke.  Refill your prescription before you run out. ° °After discharge, you should have regular check-up appointments with your healthcare provider that is prescribing your Eliquis®.  In the future your dose may need to be changed if your kidney function or weight changes by a significant amount or as you get older. ° °What do you do if you miss a dose? °If you miss a dose, take it as soon as you remember on the same day and resume taking twice daily.  Do not take more than one dose of ELIQUIS at the same time to make up a missed dose. ° °Important Safety Information °A possible side effect of Eliquis® is bleeding. You should call your healthcare provider right away if you experience any of the following: °? Bleeding from an injury or your nose that does not stop. °? Unusual colored urine (red or dark brown) or unusual colored stools (red or black). °? Unusual bruising for unknown reasons. °? A serious fall or if you hit your head  (even if there is no bleeding). ° °Some medicines may interact with Eliquis® and might increase your risk of bleeding or clotting while on Eliquis®. To help avoid this, consult your healthcare provider or pharmacist prior to using any new prescription or non-prescription medications, including herbals, vitamins, non-steroidal anti-inflammatory drugs (NSAIDs) and supplements. ° °This website has more information on Eliquis® (apixaban): http://www.eliquis.com/eliquis/home ° °

## 2016-06-02 NOTE — Progress Notes (Signed)
Discharge instructions given to patient. Patient verbalized understanding. Advised patient to pick up prescription for Eliquis at pharmacy listed. No distress at this time. Patient currently eating lunch and awaiting transportation.

## 2016-06-02 NOTE — Progress Notes (Signed)
A&O. Independent. IV fluids d/c'd. Groin site from cath unremarkable. Possible d/c today.

## 2016-06-02 NOTE — Care Management Obs Status (Signed)
[facility] NOTIFICATION   Patient Details  Name: Dennison Labombard MRN: [id number] Date of Birth: 01-11-1951   Medicare Observation Status Notification Given:  Yes   Given out of time frame. When CM printed census for 2.15- patient was off the unit.  When they are off the unit- their name does not show on census so CM did not know of patient/visit status. When printed census morning CM saw name and observation visit status.     Katrina Stack, RN 06/02/2016, 8:16 AM

## 2016-06-02 NOTE — Care Management Note (Signed)
Case Management Note  Patient Details  Name: Anatoliy Stockert MRN: [id number] Date of Birth: 1950-06-06  Provided with Eliquis 30 day trial coupon  Expected Discharge Date:                  Expected Discharge Plan:     In-House Referral:     Discharge planning Services     Post Acute Care Choice:    Choice offered to:     DME Arranged:    DME Agency:     HH Arranged:    [place] Agency:     Status of Service:  Completed, signed off  If discussed at [employer] of Stay Meetings, dates discussed:    Additional Comments:  Katrina Stack, RN 06/02/2016, 8:27 AM

## 2016-06-02 NOTE — Discharge Summary (Signed)
[facility] [place] NAME: Dennis Mullen    MR#:  [id number]  DATE OF BIRTH:  10/10/1950  DATE OF ADMISSION:  05/31/2016 ADMITTING PHYSICIAN: Gladstone Lighter, MD  DATE OF DISCHARGE: 06/02/2016  1:26 PM  PRIMARY CARE PHYSICIAN: Lavera Guise, MD   ADMISSION DIAGNOSIS:  New onset atrial fibrillation ([place]) [I48.91] Elevated troponin [R74.8] Atrial fibrillation with rapid ventricular response ([place]) [I48.91]  DISCHARGE DIAGNOSIS:  Active Problems:   Chest pain   SECONDARY DIAGNOSIS:   Past Medical History:  Diagnosis Date  . Abnormal levels of other serum enzymes   . Anxiety   . Bipolar disorder ([place])   . BPH (benign prostatic hyperplasia)   . CAD (coronary artery disease)   . CHF (congestive heart failure) ([place])    ?  Marland Kitchen Depression   . Diabetes mellitus, type II ([place])   . Diabetes mellitus, type II, insulin dependent ([place])   . ED (erectile dysfunction)   . GERD (gastroesophageal reflux disease)   . Gout   . History of borderline personality disorder   . Hypertension   . Hypogonadism in male   . Insomnia   . Mood disorder ([place])   . Neuropathy ([place])   . Obesity   . OSA (obstructive sleep apnea)   . PVD (peripheral vascular disease) ([place])   . Thyroid disease      ADMITTING HISTORY  HISTORY OF PRESENT ILLNESS:  Dennis Mullen  is a 66 y.o. male with a known history of  CAD status post stents, the last one in September 2017, insulin-dependent diabetes mellitus, hypertension, GERD, depression and anxiety, congestive heart failure, sleep apnea presents to the hospital secondary to chest pressure and palpitations that started post midnight. Patient has a complicated cardiac history with 6 stents in the past, the last one being in September 2017 at [facility]. He says he has occasional angina that usually is relieved by nitroglycerin. He was in his normal state of health up until last night. Went to bed and suddenly woke up due to  pressure in his chest that was radiating to his back without any improvement. Denies any nausea, vomiting. No fevers or chills. No dyspnea or diaphoresis. He was noted to be in atrial fibrillation with rapid ventricular response, heart rate of 1:15 the emergency room. No prior history of atrial fibrillation. He converted back to normal sinus rhythm with medications, however troponin is elevated. His chest pressure is much improved at this time. He is being admitted for observation   HOSPITAL COURSE:   * Angina, chronic Cath with no significant lesions amenable to PCI Likely has small vessel disease. On asa and plavix with statin and BB.  Last stent >6 months Will need to stop ASA to start Eliquis for Afib.  * New onset Afib. Rate controlled Will start Eliquis. Stop aspirin and continue plavix. In NSR at time of discharge  * HTN Continue home meds  Discussed with Dr. Stephani Police.  F/u with Dr. [name] in 1 week  CONSULTS OBTAINED:  Treatment Team:  Isaias Cowman, MD  DRUG ALLERGIES:  No Known Allergies  DISCHARGE MEDICATIONS:   Discharge Medication List as of 06/02/2016 11:54 AM    START taking these medications   Details  apixaban (ELIQUIS) 5 MG TABS tablet Take 1 tablet (5 mg total) by mouth 2 (two) times daily., Starting Fri 06/02/2016, Normal      CONTINUE these medications which have NOT CHANGED   Details  acidophilus (RISAQUAD) CAPS  capsule Take 1 capsule by mouth daily., Historical Med    allopurinol (ZYLOPRIM) 100 MG tablet Take 100 mg by mouth every morning., Historical Med    amLODipine (NORVASC) 5 MG tablet Take 5 mg by mouth at bedtime. , Starting Wed 10/14/2014, Historical Med    clopidogrel (PLAVIX) 75 MG tablet Take 75 mg by mouth every morning. Reported on 07/26/2015, Historical Med    colchicine 0.6 MG tablet Take 0.6 mg by mouth 2 (two) times daily as needed (gout flare). , Starting Mon 11/08/2015, Historical Med    diclofenac (VOLTAREN) 75 MG  EC tablet Take 75 mg by mouth daily as needed (gout flare). , Starting Thu 12/09/2015, Historical Med    divalproex (DEPAKOTE ER) 500 MG 24 hr tablet Take 1-2 tablets (500-1,000 mg total) by mouth 2 (two) times daily. Take 534m in morning and 10035min evening, Starting Tue 05/16/2016, Normal    DULoxetine (CYMBALTA) 60 MG capsule Take 1 capsule (60 mg total) by mouth daily., Starting Tue 05/16/2016, Normal    esomeprazole (NEXIUM) 40 MG capsule Take 40 mg by mouth daily. , Starting Wed 10/14/2014, Historical Med    fenofibrate (TRICOR) 145 MG tablet Take 145 mg by mouth daily. , Starting Thu 12/23/2015, Until Fri 12/22/2016, Historical Med    furosemide (LASIX) 40 MG tablet Take 1 tablet (40 mg total) by mouth daily., Starting Sat 09/25/2015, Print    glucose 4 GM chewable tablet Chew 1 tablet by mouth as needed for low blood sugar., Historical Med    Insulin Disposable Pump (V-GO 30) KIT Inject 0.8 Units into the skin every hour. Reported on 06/29/2015, Starting Wed 10/07/2014, Historical Med    isosorbide mononitrate (IMDUR) 30 MG 24 hr tablet Take 1 tablet (30 mg total) by mouth daily., Starting Tue 10/12/2015, Print    levothyroxine (SYNTHROID, LEVOTHROID) 50 MCG tablet Take 50 mcg by mouth daily before breakfast., Historical Med    metFORMIN (GLUCOPHAGE) 1000 MG tablet Take 1,000 mg by mouth 2 (two) times daily with a meal., Historical Med    metoprolol succinate (TOPROL-XL) 50 MG 24 hr tablet Take 50 mg by mouth daily. , Starting Fri 09/11/2014, Historical Med    nitroGLYCERIN (NITROSTAT) 0.4 MG SL tablet Place 0.4 mg under the tongue every 5 (five) minutes x 3 doses as needed for chest pain. , Historical Med    pregabalin (LYRICA) 100 MG capsule Take 100 mg by mouth 3 (three) times daily., Historical Med    rOPINIRole (REQUIP) 1 MG tablet Take 1 mg by mouth 2 (two) times daily. , Starting Tue 04/27/2015, Historical Med    rosuvastatin (CRESTOR) 20 MG tablet Take 20 mg by mouth daily.,  Historical Med    VICTOZA 18 MG/3ML SOPN Inject 1.8 mg into the skin daily. , Starting Mon 09/07/2014, Historical Med    vitamin C (ASCORBIC ACID) 500 MG tablet Take 500 mg by mouth daily., Historical Med    zolpidem (AMBIEN) 10 MG tablet Take 10 mg by mouth at bedtime as needed for sleep., Historical Med      STOP taking these medications     aspirin EC 81 MG tablet         Today   VITAL SIGNS:  Blood pressure 123/87, pulse 60, temperature 97.9 F (36.6 C), temperature source Oral, resp. rate 12, height 6' 2"  (1.88 m), weight 130.2 kg (287 lb), SpO2 100 %.  I/O:   Intake/Output Summary (Last 24 hours) at 06/02/16 1547 Last data filed at 06/02/16 1256  Gross per 24 hour  Intake              240 ml  Output             1450 ml  Net            -1210 ml    PHYSICAL EXAMINATION:  Physical Exam  GENERAL:  66 y.o.-year-old patient lying in the bed with no acute distress.  LUNGS: Normal breath sounds bilaterally, no wheezing, rales,rhonchi or crepitation. No use of accessory muscles of respiration.  CARDIOVASCULAR: S1, S2 normal. No murmurs, rubs, or gallops.  ABDOMEN: Soft, non-tender, non-distended. Bowel sounds present. No organomegaly or mass.  NEUROLOGIC: Moves all 4 extremities. PSYCHIATRIC: The patient is alert and oriented x 3.  SKIN: No obvious rash, lesion, or ulcer.   DATA REVIEW:   CBC  Recent Labs Lab 06/01/16 0025  WBC 6.5  HGB 10.8*  HCT 31.8*  PLT 279    Chemistries   Recent Labs Lab 06/01/16 0025  NA 134*  K 4.5  CL 104  CO2 23  GLUCOSE 283*  BUN 35*  CREATININE 1.34*  CALCIUM 8.9    Cardiac Enzymes  Recent Labs Lab 06/01/16 0025  TROPONINI 0.89*    Microbiology Results  Results for orders placed or performed during the hospital encounter of 02/01/16  MRSA PCR Screening     Status: None   Collection Time: 02/02/16  3:10 AM  Result Value Ref Range Status   MRSA by PCR NEGATIVE NEGATIVE Final    Comment:        The GeneXpert  MRSA Assay (FDA approved for NASAL specimens only), is one component of a comprehensive MRSA colonization surveillance program. It is not intended to diagnose MRSA infection nor to guide or monitor treatment for MRSA infections.   Aerobic/Anaerobic Culture (surgical/deep wound)     Status: None   Collection Time: 02/03/16  5:11 PM  Result Value Ref Range Status   Specimen Description WOUND RIGHT FOOT  Final   Special Requests NONE  Final   Gram Stain   Final    FEW WBC PRESENT,BOTH PMN AND MONONUCLEAR MODERATE GRAM POSITIVE COCCI IN PAIRS IN SINGLES RARE GRAM NEGATIVE DIPLOCOCCI    Culture   Final    ABUNDANT STAPHYLOCOCCUS AUREUS NO ANAEROBES ISOLATED Performed at [facility]    Report Status 02/09/2016 FINAL  Final   Organism ID, Bacteria STAPHYLOCOCCUS AUREUS  Final      Susceptibility   Staphylococcus aureus - MIC*    CIPROFLOXACIN <=0.5 SENSITIVE Sensitive     ERYTHROMYCIN 1 INTERMEDIATE Intermediate     GENTAMICIN <=0.5 SENSITIVE Sensitive     OXACILLIN 2 SENSITIVE Sensitive     TETRACYCLINE <=1 SENSITIVE Sensitive     VANCOMYCIN <=0.5 SENSITIVE Sensitive     TRIMETH/SULFA <=10 SENSITIVE Sensitive     CLINDAMYCIN <=0.25 SENSITIVE Sensitive     RIFAMPIN <=0.5 SENSITIVE Sensitive     Inducible Clindamycin NEGATIVE Sensitive     * ABUNDANT STAPHYLOCOCCUS AUREUS    RADIOLOGY:  No results found.  Follow up with PCP in 1 week.  Management plans discussed with the patient, family and they are in agreement.  CODE STATUS:     Code Status Orders        Start     Ordered   05/31/16 1218  Full code  Continuous     05/31/16 1218    Code Status History    Date Active Date Inactive Code Status  Order ID Comments User Context   03/12/2016  5:57 PM 03/13/2016  7:52 PM Full Code [id number]  Nicholes Mango, MD Inpatient   02/02/2016  1:37 AM 02/03/2016 10:08 PM Full Code [id number]  Holley Raring, NP ED   10/11/2015 12:44 PM 10/12/2015  5:30 PM Full Code  [id number]  Vaughan Basta, MD Inpatient   09/24/2015  3:12 AM 09/25/2015  3:06 PM Full Code [id number]  Saundra Shelling, MD Inpatient   11/14/2014  5:07 AM 11/15/2014  4:53 PM Full Code [id number]  Harrie Foreman, MD ED    Advance Directive Documentation   Flowsheet Row Most Recent Value  Type of Advance Directive  Healthcare Power of Attorney, Living will  Pre-existing out of facility DNR order (yellow form or pink MOST form)  No data  "MOST" Form in Place?  No data      TOTAL TIME TAKING CARE OF THIS PATIENT ON DAY OF DISCHARGE: more than 30 minutes.   Hillary Bow R M.D on 06/02/2016 at 3:47 PM  Between 7am to 6pm - Pager - [phone]  After 6pm go to [email or web address] - password [email or web address] [facility]  Office  [phone]  CC: Primary care physician; Lavera Guise, MD  Note: This dictation was prepared with Dragon dictation along with smaller phrase technology. Any transcriptional errors that result from this process are unintentional.

## 2016-06-02 NOTE — Progress Notes (Signed)
[facility] Cardiology  SUBJECTIVE: I don't have chest pain   Vitals:   06/01/16 1710 06/01/16 2019 06/02/16 0616 06/02/16 0755  BP: (!) 160/72 (!) 120/58 (!) 119/52 113/65  Pulse: (!) 52 (!) 57 61 66  Resp: 16 14 14 16   Temp: 97.4 F (36.3 C) 97.6 F (36.4 C) 97.3 F (36.3 C) 98.1 F (36.7 C)  TempSrc: Oral Oral Oral Oral  SpO2: 100% 97% 99% 97%  Weight:      Height:         Intake/Output Summary (Last 24 hours) at 06/02/16 [id number] Last data filed at 06/01/16 1216  Gross per 24 hour  Intake                3 ml  Output                0 ml  Net                3 ml      PHYSICAL EXAM  General: Well developed, well nourished, in no acute distress HEENT:  Normocephalic and atramatic Neck:  No JVD.  Lungs: Clear bilaterally to auscultation and percussion. Heart: HRRR . Normal S1 and S2 without gallops or murmurs.  Abdomen: Bowel sounds are positive, abdomen soft and non-tender  Msk:  Back normal, normal gait. Normal strength and tone for age. Extremities: No clubbing, cyanosis or edema.   Neuro: Alert and oriented X 3. Psych:  Good affect, responds appropriately   LABS: Basic Metabolic Panel:  Recent Labs  05/31/16 0420 06/01/16 0025  NA 134* 134*  K 4.3 4.5  CL 102 104  CO2 22 23  GLUCOSE 306* 283*  BUN 32* 35*  CREATININE 1.26* 1.34*  CALCIUM 9.3 8.9   Liver Function Tests: No results for input(s): AST, ALT, ALKPHOS, BILITOT, PROT, ALBUMIN in the last 72 hours. No results for input(s): LIPASE, AMYLASE in the last 72 hours. CBC:  Recent Labs  05/31/16 0420 06/01/16 0025  WBC 8.1 6.5  HGB 11.6* 10.8*  HCT 34.0* 31.8*  MCV 84.4 84.9  PLT 267 279   Cardiac Enzymes:  Recent Labs  05/31/16 1256 05/31/16 1807 06/01/16 0025  TROPONINI 1.79* 1.37* 0.89*   BNP: Invalid input(s): POCBNP D-Dimer: No results for input(s): DDIMER in the last 72 hours. Hemoglobin A1C:  Recent Labs  05/31/16 0420  HGBA1C 7.5*   Fasting Lipid Panel: No results for  input(s): CHOL, HDL, LDLCALC, TRIG, CHOLHDL, LDLDIRECT in the last 72 hours. Thyroid Function Tests: No results for input(s): TSH, T4TOTAL, T3FREE, THYROIDAB in the last 72 hours.  Invalid input(s): FREET3 Anemia Panel: No results for input(s): VITAMINB12, FOLATE, FERRITIN, TIBC, IRON, RETICCTPCT in the last 72 hours.  No results found.   Echo  LV EF 55-65%  TELEMETRY: Atrial fibrillation at a rate of 70 bpm:  ASSESSMENT AND PLAN:  Active Problems:   Chest pain    1. Elevated troponin, patent stents by cardiac catheterization 06/01/2016 2. Atrial fibrillation, rate controlled, chads Vasc 4  Recommendations  1. Medical therapy 2. Start Eliquis for stroke prevention 3. DC aspirin, continue clopidogrel 4. Continue metoprolol succinate for rate control   Isaias Cowman, MD, PhD, [facility] 06/02/2016 8:06 AM

## 2016-06-05 DIAGNOSIS — L97521 Non-pressure chronic ulcer of other part of left foot limited to breakdown of skin: Secondary | ICD-10-CM | POA: Diagnosis not present

## 2016-06-05 DIAGNOSIS — L97511 Non-pressure chronic ulcer of other part of right foot limited to breakdown of skin: Secondary | ICD-10-CM | POA: Diagnosis not present

## 2016-06-12 DIAGNOSIS — I25118 Atherosclerotic heart disease of native coronary artery with other forms of angina pectoris: Secondary | ICD-10-CM | POA: Diagnosis not present

## 2016-06-12 DIAGNOSIS — I480 Paroxysmal atrial fibrillation: Secondary | ICD-10-CM | POA: Diagnosis not present

## 2016-06-12 DIAGNOSIS — I214 Non-ST elevation (NSTEMI) myocardial infarction: Secondary | ICD-10-CM | POA: Diagnosis not present

## 2016-06-12 DIAGNOSIS — I10 Essential (primary) hypertension: Secondary | ICD-10-CM | POA: Diagnosis not present

## 2016-06-12 DIAGNOSIS — R0789 Other chest pain: Secondary | ICD-10-CM | POA: Diagnosis not present

## 2016-06-20 DIAGNOSIS — E039 Hypothyroidism, unspecified: Secondary | ICD-10-CM | POA: Diagnosis not present

## 2016-06-20 DIAGNOSIS — Z794 Long term (current) use of insulin: Secondary | ICD-10-CM | POA: Diagnosis not present

## 2016-06-20 DIAGNOSIS — E1142 Type 2 diabetes mellitus with diabetic polyneuropathy: Secondary | ICD-10-CM | POA: Diagnosis not present

## 2016-06-20 DIAGNOSIS — I25118 Atherosclerotic heart disease of native coronary artery with other forms of angina pectoris: Secondary | ICD-10-CM | POA: Diagnosis not present

## 2016-06-20 DIAGNOSIS — Z8631 Personal history of diabetic foot ulcer: Secondary | ICD-10-CM | POA: Diagnosis not present

## 2016-06-27 DIAGNOSIS — E1165 Type 2 diabetes mellitus with hyperglycemia: Secondary | ICD-10-CM | POA: Diagnosis not present

## 2016-06-27 DIAGNOSIS — I10 Essential (primary) hypertension: Secondary | ICD-10-CM | POA: Diagnosis not present

## 2016-06-27 DIAGNOSIS — E039 Hypothyroidism, unspecified: Secondary | ICD-10-CM | POA: Diagnosis not present

## 2016-06-27 DIAGNOSIS — G4733 Obstructive sleep apnea (adult) (pediatric): Secondary | ICD-10-CM | POA: Diagnosis not present

## 2016-06-28 DIAGNOSIS — I890 Lymphedema, not elsewhere classified: Secondary | ICD-10-CM | POA: Diagnosis not present

## 2016-07-03 DIAGNOSIS — L97511 Non-pressure chronic ulcer of other part of right foot limited to breakdown of skin: Secondary | ICD-10-CM | POA: Diagnosis not present

## 2016-07-03 DIAGNOSIS — L97521 Non-pressure chronic ulcer of other part of left foot limited to breakdown of skin: Secondary | ICD-10-CM | POA: Diagnosis not present

## 2016-07-07 DIAGNOSIS — E119 Type 2 diabetes mellitus without complications: Secondary | ICD-10-CM | POA: Diagnosis not present

## 2016-08-01 DIAGNOSIS — Z794 Long term (current) use of insulin: Secondary | ICD-10-CM | POA: Diagnosis not present

## 2016-08-01 DIAGNOSIS — E039 Hypothyroidism, unspecified: Secondary | ICD-10-CM | POA: Diagnosis not present

## 2016-08-01 DIAGNOSIS — E1142 Type 2 diabetes mellitus with diabetic polyneuropathy: Secondary | ICD-10-CM | POA: Diagnosis not present

## 2016-08-01 DIAGNOSIS — Z8631 Personal history of diabetic foot ulcer: Secondary | ICD-10-CM | POA: Diagnosis not present

## 2016-08-01 NOTE — Progress Notes (Deleted)
08/02/2016 6:54 PM   [name] September 21, 1950 [id number]  Referring provider: Lavera Guise, MD [address] [place], [place] [address]  No chief complaint on file.   HPI: 66 yo WM with hypogonadism, elevated PSA, ED and BPH with LU TS who presents today for a one year follow up.  Hypogonadism T levels <200, was on androgel previously but not at present. Has CAD. Newly dx A Fib. Did not notice significant change in fatigue / mood / libido on vs. off therapy. Remains off.   Elevated PSA PSA 4.8, 3.7 in setting of testosterone <200 2017 ===> NEGATIVE BIOPSY TRUS 28 mL / scattered calcifications.  Erectile dysfunction s/p penile prosthesis by Yves Dill previously. Uses rarely and "underwhealmed" with result, but is functional.  BPH WITH LUTS His IPSS score today is ***, which is *** lower urinary tract symptomatology. He is *** with his quality life due to his urinary symptoms. His PVR is *** mL.  His previous IPSS score was ***.  His previous PVR is *** mL.    His major complaint today ***.  He has had these symptoms for *** years.  He denies any dysuria, hematuria or suprapubic pain.   He currently taking ***.  His has had ***.  Previous PSA's:     He also denies any recent fevers, chills, nausea or vomiting.  He has a family history of PCa, with ***.   He does not have a family history of PCa.***    Score:  1-7 Mild 8-19 Moderate 20-35 Severe    PMH: Past Medical History:  Diagnosis Date  . Abnormal levels of other serum enzymes   . Anxiety   . Bipolar disorder ([place])   . BPH (benign prostatic hyperplasia)   . CAD (coronary artery disease)   . CHF (congestive heart failure) ([place])    ?  Marland Kitchen Depression   . Diabetes mellitus, type II ([place])   . Diabetes mellitus, type II, insulin dependent ([place])   . ED (erectile dysfunction)   . GERD (gastroesophageal reflux disease)   . Gout   . History of borderline personality disorder   . Hypertension   .  Hypogonadism in male   . Insomnia   . Mood disorder ([place])   . Neuropathy ([place])   . Obesity   . OSA (obstructive sleep apnea)   . PVD (peripheral vascular disease) ([place])   . Thyroid disease     Surgical History: Past Surgical History:  Procedure Laterality Date  . CARDIAC CATHETERIZATION N/A 10/12/2015   Procedure: Left Heart Cath and Coronary Angiography;  Surgeon: Teodoro Spray, MD;  Location: [facility];  Service: Cardiovascular;  Laterality: N/A;  . CORONARY ANGIOPLASTY WITH STENT PLACEMENT     x5 stents  . FOOT SURGERY    . LEFT HEART CATH AND CORONARY ANGIOGRAPHY Right 06/01/2016   Procedure: Left Heart Cath and Coronary Angiography;  Surgeon: Isaias Cowman, MD;  Location: [facility];  Service: Cardiovascular;  Laterality: Right;  . MASTOIDECTOMY Right   . NASAL SEPTUM SURGERY    . penial inplant    . PENILE PROSTHESIS IMPLANT    . right mastoidectomy    . SCROTOPLASTY    . TONSILLECTOMY AND ADENOIDECTOMY      Home Medications:  Allergies as of 08/02/2016   No Known Allergies     Medication List       Accurate as of 08/01/16  6:54 PM. Always use your most recent med  list.          acidophilus Caps capsule Take 1 capsule by mouth daily.   allopurinol 100 MG tablet Commonly known as:  ZYLOPRIM Take 100 mg by mouth every morning.   amLODipine 5 MG tablet Commonly known as:  NORVASC Take 5 mg by mouth at bedtime.   apixaban 5 MG Tabs tablet Commonly known as:  ELIQUIS Take 1 tablet (5 mg total) by mouth 2 (two) times daily.   clopidogrel 75 MG tablet Commonly known as:  PLAVIX Take 75 mg by mouth every morning. Reported on 07/26/2015   colchicine 0.6 MG tablet Take 0.6 mg by mouth 2 (two) times daily as needed (gout flare).   diclofenac 75 MG EC tablet Commonly known as:  VOLTAREN Take 75 mg by mouth daily as needed (gout flare).   divalproex 500 MG 24 hr tablet Commonly known as:  DEPAKOTE ER Take 1-2 tablets (500-1,000 mg  total) by mouth 2 (two) times daily. Take 570m in morning and 10014min evening   DULoxetine 60 MG capsule Commonly known as:  CYMBALTA Take 1 capsule (60 mg total) by mouth daily.   esomeprazole 40 MG capsule Commonly known as:  NEXIUM Take 40 mg by mouth daily.   fenofibrate 145 MG tablet Commonly known as:  TRICOR Take 145 mg by mouth daily.   furosemide 40 MG tablet Commonly known as:  LASIX Take 1 tablet (40 mg total) by mouth daily.   glucose 4 GM chewable tablet Chew 1 tablet by mouth as needed for low blood sugar.   isosorbide mononitrate 30 MG 24 hr tablet Commonly known as:  IMDUR Take 1 tablet (30 mg total) by mouth daily.   levothyroxine 50 MCG tablet Commonly known as:  SYNTHROID, LEVOTHROID Take 50 mcg by mouth daily before breakfast.   metFORMIN 1000 MG tablet Commonly known as:  GLUCOPHAGE Take 1,000 mg by mouth 2 (two) times daily with a meal.   metoprolol succinate 50 MG 24 hr tablet Commonly known as:  TOPROL-XL Take 50 mg by mouth daily.   nitroGLYCERIN 0.4 MG SL tablet Commonly known as:  NITROSTAT Place 0.4 mg under the tongue every 5 (five) minutes x 3 doses as needed for chest pain.   pregabalin 100 MG capsule Commonly known as:  LYRICA Take 100 mg by mouth 3 (three) times daily.   rOPINIRole 1 MG tablet Commonly known as:  REQUIP Take 1 mg by mouth 2 (two) times daily.   rosuvastatin 20 MG tablet Commonly known as:  CRESTOR Take 20 mg by mouth daily.   V-GO 30 Kit Inject 0.8 Units into the skin every hour. Reported on 06/29/2015   VICTOZA 18 MG/3ML Sopn Generic drug:  liraglutide Inject 1.8 mg into the skin daily.   vitamin C 500 MG tablet Commonly known as:  ASCORBIC ACID Take 500 mg by mouth daily.   zolpidem 10 MG tablet Commonly known as:  AMBIEN Take 10 mg by mouth at bedtime as needed for sleep.       Allergies: No Known Allergies  Family History: Family History  Problem Relation Age of Onset  . Lung cancer  Mother   . Heart attack Father   . Post-traumatic stress disorder Father   . Anxiety disorder Father   . Depression Father   . Heart attack Sister   . Heart attack Brother     Social History:  reports that he has never smoked. He has never used smokeless tobacco. He reports that  he does not drink alcohol or use drugs.  ROS:                                        Physical Exam: There were no vitals taken for this visit.  Constitutional: Well nourished. Alert and oriented, No acute distress. HEENT: [place] AT, moist mucus membranes. Trachea midline, no masses. Cardiovascular: No clubbing, cyanosis, or edema. Respiratory: Normal respiratory effort, no increased work of breathing. GI: Abdomen is soft, non tender, non distended, no abdominal masses. Liver and spleen not palpable.  No hernias appreciated.  Stool sample for occult testing is not indicated.   GU: No CVA tenderness.  No bladder fullness or masses.  Patient with circumcised/uncircumcised phallus. ***Foreskin easily retracted***  Urethral meatus is patent.  No penile discharge. No penile lesions or rashes. Scrotum without lesions, cysts, rashes and/or edema.  Testicles are located scrotally bilaterally. No masses are appreciated in the testicles. Left and right epididymis are normal. Rectal: Patient with  normal sphincter tone. Anus and perineum without scarring or rashes. No rectal masses are appreciated. Prostate is approximately *** grams, *** nodules are appreciated. Seminal vesicles are normal. Skin: No rashes, bruises or suspicious lesions. Lymph: No cervical or inguinal adenopathy. Neurologic: Grossly intact, no focal deficits, moving all 4 extremities. Psychiatric: Normal mood and affect.  Laboratory Data: Lab Results  Component Value Date   WBC 6.5 06/01/2016   HGB 10.8 (L) 06/01/2016   HCT 31.8 (L) 06/01/2016   MCV 84.9 06/01/2016   PLT 279 06/01/2016    Lab Results  Component Value Date    CREATININE 1.34 (H) 06/01/2016     Lab Results  Component Value Date   TESTOSTERONE 220 (L) 07/02/2015    Lab Results  Component Value Date   HGBA1C 7.5 (H) 05/31/2016    Lab Results  Component Value Date   TSH 1.441 11/14/2014       Component Value Date/Time   CHOL 176 10/11/2015 1443   CHOL 223 (H) 01/29/2014 0553   HDL 45 10/11/2015 1443   HDL 34 (L) 01/29/2014 0553   CHOLHDL 3.9 10/11/2015 1443   VLDL 42 (H) 10/11/2015 1443   VLDL SEE COMMENT 01/29/2014 0553   LDLCALC 89 10/11/2015 1443   LDLCALC SEE COMMENT 01/29/2014 0553    Lab Results  Component Value Date   AST 25 03/13/2016   Lab Results  Component Value Date   ALT 17 03/13/2016     Pertinent Imaging: ***  Assessment & Plan:  ***  1. Hypogonadism - agree with continued non-replacement, especially in setting of significant CV comorbidity.  2. Elevated PSA - now s/p negative BX. This is good news. Discussed single BX able ton encompass about 70% lifetime risk, would consider repeat only for future significant and persistent PSA elevations or change in exam.   3 - Erectile Dysfunction - IPP remains functional, continue.   4. BPH with LUTS  - IPSS score is ***, it is stable/improving/worsening  - Continue conservative management, avoiding bladder irritants and timed voiding's  - most bothersome symptoms is/are ***  - Initiate alpha-blocker (***), discussed side effects  - Initiate 5 alpha reductase inhibitor (***), discussed side effects  - Continue tamsulosin 0.4 mg daily, alfuzosin 10 mg daily, Rapaflo 8 mg daily, terazosin, doxazosin, Cialis 5 mg daily and finasteride 5 mg daily, dutasteride 0.5 mg daily***:refills given  - Cannot tolerate medication  or medication failure, schedule cystoscopy ***  - RTC in *** months for IPSS, PSA, PVR and exam        No Follow-up on file.  These notes generated with voice recognition software. I apologize for typographical errors.  Zara Council,  [facility] [address], [place], [place] [address] [phone]

## 2016-08-02 ENCOUNTER — Ambulatory Visit: Payer: Self-pay | Admitting: Urology

## 2016-08-02 ENCOUNTER — Encounter: Payer: Self-pay | Admitting: Urology

## 2016-08-03 DIAGNOSIS — Q6689 Other  specified congenital deformities of feet: Secondary | ICD-10-CM | POA: Diagnosis not present

## 2016-08-03 DIAGNOSIS — L97521 Non-pressure chronic ulcer of other part of left foot limited to breakdown of skin: Secondary | ICD-10-CM | POA: Diagnosis not present

## 2016-08-03 DIAGNOSIS — M2042 Other hammer toe(s) (acquired), left foot: Secondary | ICD-10-CM | POA: Diagnosis not present

## 2016-08-04 ENCOUNTER — Other Ambulatory Visit: Payer: Self-pay | Admitting: Podiatry

## 2016-08-05 NOTE — Progress Notes (Signed)
08/07/2016 4:22 PM   Dennis Mullen [facility] 1951-02-25 [id number]  Referring provider: Lavera Guise, MD [address] [place], [place] [address]  Chief Complaint  Patient presents with  . Benign Prostatic Hypertrophy    1 year follow up    HPI: 66 yo WM with hypogonadism, elevated PSA, ED and BPH with LUTS who presents today for a one year follow up.  Hypogonadism T levels <200, was on androgel previously but not at present. Has CAD. Newly dx A Fib. Did not notice significant change in fatigue / mood / libido on vs. off therapy. Remains off.   Elevated PSA PSA 4.8, 3.7 in setting of testosterone <200 2017 ===> NEGATIVE BIOPSY TRUS 28 mL / scattered calcifications.  Erectile dysfunction s/p penile prosthesis by Yves Dill previously. Uses rarely and "underwhealmed" with result, but is functional.  BPH WITH LUTS His IPSS score today is 7, which is mild lower urinary tract symptomatology. He is delighted with his quality life due to his urinary symptoms.  He has no major complaints today.  He denies any dysuria, hematuria or suprapubic pain.  He also denies any recent fevers, chills, nausea or vomiting.  He does not have a family history of PCa.      IPSS    Row Name 08/07/16 1500         International Prostate Symptom Score   How often have you had the sensation of not emptying your bladder? Less than 1 in 5     How often have you had to urinate less than every two hours? Less than 1 in 5 times     How often have you found you stopped and started again several times when you urinated? Less than 1 in 5 times     How often have you found it difficult to postpone urination? Less than 1 in 5 times     How often have you had a weak urinary stream? Less than 1 in 5 times     How often have you had to strain to start urination? Less than 1 in 5 times     How many times did you typically get up at night to urinate? 1 Time     Total IPSS Score 7       Quality of Life due to urinary  symptoms   If you were to spend the rest of your life with your urinary condition just the way it is now how would you feel about that? Delighted        Score:  1-7 Mild 8-19 Moderate 20-35 Severe    PMH: Past Medical History:  Diagnosis Date  . Abnormal levels of other serum enzymes   . Anxiety   . Bipolar disorder ([place])   . BPH (benign prostatic hyperplasia)   . CAD (coronary artery disease)   . CHF (congestive heart failure) ([place])    ?  Marland Kitchen Depression   . Diabetes mellitus, type II ([place])   . Diabetes mellitus, type II, insulin dependent ([place])   . ED (erectile dysfunction)   . GERD (gastroesophageal reflux disease)   . Gout   . History of borderline personality disorder   . Hypertension   . Hypogonadism in male   . Insomnia   . Mood disorder ([place])   . Neuropathy   . Obesity   . OSA (obstructive sleep apnea)   . PVD (peripheral vascular disease) ([place])   . Thyroid disease     Surgical History: Past  Surgical History:  Procedure Laterality Date  . CARDIAC CATHETERIZATION N/A 10/12/2015   Procedure: Left Heart Cath and Coronary Angiography;  Surgeon: Teodoro Spray, MD;  Location: [place] [facility];  Service: Cardiovascular;  Laterality: N/A;  . CORONARY ANGIOPLASTY WITH STENT PLACEMENT     x5 stents  . FOOT SURGERY    . LEFT HEART CATH AND CORONARY ANGIOGRAPHY Right 06/01/2016   Procedure: Left Heart Cath and Coronary Angiography;  Surgeon: Isaias Cowman, MD;  Location: [place] [facility];  Service: Cardiovascular;  Laterality: Right;  . MASTOIDECTOMY Right   . NASAL SEPTUM SURGERY    . penial inplant    . PENILE PROSTHESIS IMPLANT    . right mastoidectomy    . SCROTOPLASTY    . TONSILLECTOMY AND ADENOIDECTOMY      Home Medications:  Allergies as of 08/07/2016   No Known Allergies     Medication List       Accurate as of 08/07/16  4:22 PM. Always use your most recent med list.          acidophilus Caps capsule Take 1 capsule by mouth  daily.   allopurinol 100 MG tablet Commonly known as:  ZYLOPRIM Take 100 mg by mouth every morning.   amLODipine 5 MG tablet Commonly known as:  NORVASC Take 5 mg by mouth at bedtime.   apixaban 5 MG Tabs tablet Commonly known as:  ELIQUIS Take 1 tablet (5 mg total) by mouth 2 (two) times daily.   clopidogrel 75 MG tablet Commonly known as:  PLAVIX Take 75 mg by mouth every morning. Reported on 07/26/2015   colchicine 0.6 MG tablet Take 0.6 mg by mouth 2 (two) times daily as needed (gout flare).   diclofenac 75 MG EC tablet Commonly known as:  VOLTAREN Take 75 mg by mouth daily as needed (gout flare).   divalproex 500 MG 24 hr tablet Commonly known as:  DEPAKOTE ER Take 1-2 tablets (500-1,000 mg total) by mouth 2 (two) times daily. Take 517m in morning and 10042min evening   DULoxetine 60 MG capsule Commonly known as:  CYMBALTA Take 1 capsule (60 mg total) by mouth daily.   esomeprazole 40 MG capsule Commonly known as:  NEXIUM Take 40 mg by mouth daily.   fenofibrate 145 MG tablet Commonly known as:  TRICOR Take 145 mg by mouth daily.   FREESTYLE LIBRE SENSOR SYSTEM Misc Use 1 each as directed. Please place prior to office visit as directed   furosemide 40 MG tablet Commonly known as:  LASIX Take 1 tablet (40 mg total) by mouth daily.   glucose 4 GM chewable tablet Chew 1 tablet by mouth as needed for low blood sugar.   insulin lispro 100 UNIT/ML KiwkPen Commonly known as:  HUMALOG Use upto 30units per day as directed   isosorbide mononitrate 60 MG 24 hr tablet Commonly known as:  IMDUR Take by mouth.   levothyroxine 50 MCG tablet Commonly known as:  SYNTHROID, LEVOTHROID Take 50 mcg by mouth daily before breakfast.   metFORMIN 1000 MG tablet Commonly known as:  GLUCOPHAGE Take 1,000 mg by mouth 2 (two) times daily with a meal.   metoprolol succinate 50 MG 24 hr tablet Commonly known as:  TOPROL-XL Take 50 mg by mouth daily.   nitroGLYCERIN 0.4  MG SL tablet Commonly known as:  NITROSTAT Place 0.4 mg under the tongue every 5 (five) minutes x 3 doses as needed for chest pain.   pregabalin 100 MG  capsule Commonly known as:  LYRICA Take 100 mg by mouth 3 (three) times daily.   rOPINIRole 1 MG tablet Commonly known as:  REQUIP Take 1 mg by mouth 2 (two) times daily.   rosuvastatin 20 MG tablet Commonly known as:  CRESTOR Take 20 mg by mouth daily.   sulfamethoxazole-trimethoprim 800-160 MG tablet Commonly known as:  BACTRIM DS,SEPTRA DS Take by mouth.   V-GO 30 Kit Inject 0.8 Units into the skin every hour. Reported on 06/29/2015   V-GO 30 Kit Use 30 each once daily.   V-GO 20 Kit Use 30 each once daily.   VICTOZA 18 MG/3ML Sopn Generic drug:  liraglutide Inject 1.8 mg into the skin daily.   vitamin C 500 MG tablet Commonly known as:  ASCORBIC ACID Take 500 mg by mouth daily.   zolpidem 10 MG tablet Commonly known as:  AMBIEN Take 10 mg by mouth at bedtime as needed for sleep.       Allergies: No Known Allergies  Family History: Family History  Problem Relation Age of Onset  . Lung cancer Mother   . Heart attack Father   . Post-traumatic stress disorder Father   . Anxiety disorder Father   . Depression Father   . Heart attack Sister   . Heart attack Brother   . Prostate cancer Neg Hx   . Kidney cancer Neg Hx     Social History:  reports that he has never smoked. He has never used smokeless tobacco. He reports that he does not drink alcohol or use drugs.  ROS: UROLOGY Frequent Urination?: No Hard to postpone urination?: No Burning/pain with urination?: No Get up at night to urinate?: No Leakage of urine?: No Urine stream starts and stops?: No Trouble starting stream?: No Do you have to strain to urinate?: No Blood in urine?: No Urinary tract infection?: No Sexually transmitted disease?: No Injury to kidneys or bladder?: No Painful intercourse?: No Weak stream?: No Erection problems?:  No Penile pain?: No  Gastrointestinal Nausea?: No Vomiting?: No Indigestion/heartburn?: No Diarrhea?: No Constipation?: No  Constitutional Fever: No Night sweats?: No Weight loss?: No Fatigue?: No  Skin Skin rash/lesions?: No Itching?: No  Eyes Blurred vision?: No Double vision?: No  Ears/Nose/Throat Sore throat?: No Sinus problems?: No  Hematologic/Lymphatic Swollen glands?: No Easy bruising?: Yes  Cardiovascular Leg swelling?: Yes Chest pain?: Yes  Respiratory Cough?: No Shortness of breath?: No  Endocrine Excessive thirst?: Yes  Musculoskeletal Back pain?: No Joint pain?: No  Neurological Headaches?: No Dizziness?: No  Psychologic Depression?: Yes Anxiety?: Yes  Physical Exam: BP 112/64   Pulse 72   Ht _0  (1.88 m)   Wt 285 lb 12.8 oz (129.6 kg)   BMI 36.69 kg/m   Constitutional: Well nourished. Alert and oriented, No acute distress. HEENT: [place] AT, moist mucus membranes. Trachea midline, no masses. Cardiovascular: No clubbing, cyanosis, or edema. Respiratory: Normal respiratory effort, no increased work of breathing. GI: Abdomen is soft, non tender, non distended, no abdominal masses. Liver and spleen not palpable.  No hernias appreciated.  Stool sample for occult testing is not indicated.   GU: No CVA tenderness.  No bladder fullness or masses.  Patient with circumcised phallus.  Urethral meatus is patent.  No penile discharge. No penile lesions or rashes. Scrotum without lesions, cysts, rashes and/or edema.  Testicles are located scrotally bilaterally. No masses are appreciated in the testicles. Left and right epididymis are normal.  Cylinders in place.  Pump located in the  scrotum.   Rectal: Patient with  normal sphincter tone. Anus and perineum without scarring or rashes. No rectal masses are appreciated. Prostate is approximately 45 grams, no nodules are appreciated. Seminal vesicles are normal. Skin: No rashes, bruises or suspicious  lesions. Lymph: No cervical or inguinal adenopathy. Neurologic: Grossly intact, no focal deficits, moving all 4 extremities. Psychiatric: Normal mood and affect.  Laboratory Data: Lab Results  Component Value Date   WBC 6.5 06/01/2016   HGB 10.8 (L) 06/01/2016   HCT 31.8 (L) 06/01/2016   MCV 84.9 06/01/2016   PLT 279 06/01/2016    Lab Results  Component Value Date   CREATININE 1.34 (H) 06/01/2016     Lab Results  Component Value Date   TESTOSTERONE 220 (L) 07/02/2015    Lab Results  Component Value Date   HGBA1C 7.5 (H) 05/31/2016    Lab Results  Component Value Date   TSH 1.441 11/14/2014       Component Value Date/Time   CHOL 176 10/11/2015 1443   CHOL 223 (H) 01/29/2014 0553   HDL 45 10/11/2015 1443   HDL 34 (L) 01/29/2014 0553   CHOLHDL 3.9 10/11/2015 1443   VLDL 42 (H) 10/11/2015 1443   VLDL SEE COMMENT 01/29/2014 0553   LDLCALC 89 10/11/2015 1443   LDLCALC SEE COMMENT 01/29/2014 0553    Lab Results  Component Value Date   AST 25 03/13/2016   Lab Results  Component Value Date   ALT 17 03/13/2016    Assessment & Plan:    1. Hypogonadism - agree with continued non-replacement, especially in setting of significant CV comorbidity.  2. Elevated PSA - now s/p negative BX. This is good news.  Discussed single BX able ton encompass about 70% lifetime risk, would consider repeat only for future significant and persistent PSA elevations or change in exam.   3 - Erectile Dysfunction - IPP remains functional, continue.   4. BPH with LUTS  - IPSS score is 7/0  - Continue conservative management, avoiding bladder irritants and timed voiding's  - RTC in 12 months for IPSS, PSA and exam    Return in about 1 year (around 08/07/2017) for IPSS, PSA and exam.  These notes generated with voice recognition software. I apologize for typographical errors.  Zara Council, [facility] [address] [place],  [place] [address] [phone]

## 2016-08-07 ENCOUNTER — Ambulatory Visit (INDEPENDENT_AMBULATORY_CARE_PROVIDER_SITE_OTHER): Payer: Medicare Other | Admitting: Urology

## 2016-08-07 ENCOUNTER — Encounter: Payer: Self-pay | Admitting: Urology

## 2016-08-07 VITALS — BP 112/64 | HR 72 | Ht 74.0 in | Wt 285.8 lb

## 2016-08-07 DIAGNOSIS — E291 Testicular hypofunction: Secondary | ICD-10-CM | POA: Diagnosis not present

## 2016-08-07 DIAGNOSIS — N138 Other obstructive and reflux uropathy: Secondary | ICD-10-CM

## 2016-08-07 DIAGNOSIS — R972 Elevated prostate specific antigen [PSA]: Secondary | ICD-10-CM | POA: Diagnosis not present

## 2016-08-07 DIAGNOSIS — N400 Enlarged prostate without lower urinary tract symptoms: Secondary | ICD-10-CM | POA: Diagnosis not present

## 2016-08-07 DIAGNOSIS — N401 Enlarged prostate with lower urinary tract symptoms: Secondary | ICD-10-CM

## 2016-08-07 DIAGNOSIS — N529 Male erectile dysfunction, unspecified: Secondary | ICD-10-CM

## 2016-08-08 ENCOUNTER — Telehealth: Payer: Self-pay

## 2016-08-08 LAB — PSA: Prostate Specific Ag, Serum: 1.4 ng/mL (ref 0.0–4.0)

## 2016-08-08 NOTE — Telephone Encounter (Signed)
LMOM- most recent labs have decreased.

## 2016-08-08 NOTE — Telephone Encounter (Signed)
-----   Message from Nori Riis, PA-C sent at 08/08/2016  8:03 AM EDT ----- Please notify Mr. Weckwerth that his PSA has come down even further.  We will see him in one year.

## 2016-08-09 DIAGNOSIS — I10 Essential (primary) hypertension: Secondary | ICD-10-CM | POA: Diagnosis not present

## 2016-08-09 DIAGNOSIS — I6523 Occlusion and stenosis of bilateral carotid arteries: Secondary | ICD-10-CM | POA: Diagnosis not present

## 2016-08-09 DIAGNOSIS — I2510 Atherosclerotic heart disease of native coronary artery without angina pectoris: Secondary | ICD-10-CM | POA: Diagnosis not present

## 2016-08-09 DIAGNOSIS — I214 Non-ST elevation (NSTEMI) myocardial infarction: Secondary | ICD-10-CM | POA: Diagnosis not present

## 2016-08-09 DIAGNOSIS — I25118 Atherosclerotic heart disease of native coronary artery with other forms of angina pectoris: Secondary | ICD-10-CM | POA: Diagnosis not present

## 2016-08-10 ENCOUNTER — Ambulatory Visit: Payer: 59 | Admitting: Psychiatry

## 2016-08-17 DIAGNOSIS — I2584 Coronary atherosclerosis due to calcified coronary lesion: Secondary | ICD-10-CM | POA: Diagnosis not present

## 2016-08-17 DIAGNOSIS — G4733 Obstructive sleep apnea (adult) (pediatric): Secondary | ICD-10-CM | POA: Diagnosis not present

## 2016-08-17 DIAGNOSIS — R0602 Shortness of breath: Secondary | ICD-10-CM | POA: Diagnosis not present

## 2016-08-21 DIAGNOSIS — G4733 Obstructive sleep apnea (adult) (pediatric): Secondary | ICD-10-CM | POA: Diagnosis not present

## 2016-08-21 DIAGNOSIS — Z01818 Encounter for other preprocedural examination: Secondary | ICD-10-CM | POA: Diagnosis not present

## 2016-08-21 DIAGNOSIS — E1151 Type 2 diabetes mellitus with diabetic peripheral angiopathy without gangrene: Secondary | ICD-10-CM | POA: Diagnosis not present

## 2016-08-21 DIAGNOSIS — I2584 Coronary atherosclerosis due to calcified coronary lesion: Secondary | ICD-10-CM | POA: Diagnosis not present

## 2016-08-22 ENCOUNTER — Encounter
Admission: RE | Admit: 2016-08-22 | Discharge: 2016-08-22 | Disposition: A | Discharge status: Home / Self Care | Payer: Medicare Other | Source: Ambulatory Visit | Attending: Podiatry | Admitting: Podiatry

## 2016-08-22 ENCOUNTER — Ambulatory Visit: Payer: Self-pay | Admitting: Psychiatry

## 2016-08-22 DIAGNOSIS — I110 Hypertensive heart disease with heart failure: Secondary | ICD-10-CM | POA: Diagnosis not present

## 2016-08-22 DIAGNOSIS — M2042 Other hammer toe(s) (acquired), left foot: Secondary | ICD-10-CM | POA: Diagnosis not present

## 2016-08-22 DIAGNOSIS — Z818 Family history of other mental and behavioral disorders: Secondary | ICD-10-CM | POA: Diagnosis not present

## 2016-08-22 DIAGNOSIS — Z8249 Family history of ischemic heart disease and other diseases of the circulatory system: Secondary | ICD-10-CM | POA: Diagnosis not present

## 2016-08-22 DIAGNOSIS — L97521 Non-pressure chronic ulcer of other part of left foot limited to breakdown of skin: Secondary | ICD-10-CM | POA: Diagnosis not present

## 2016-08-22 DIAGNOSIS — I739 Peripheral vascular disease, unspecified: Secondary | ICD-10-CM | POA: Diagnosis not present

## 2016-08-22 DIAGNOSIS — K219 Gastro-esophageal reflux disease without esophagitis: Secondary | ICD-10-CM | POA: Diagnosis not present

## 2016-08-22 DIAGNOSIS — I509 Heart failure, unspecified: Secondary | ICD-10-CM | POA: Diagnosis not present

## 2016-08-22 DIAGNOSIS — L97529 Non-pressure chronic ulcer of other part of left foot with unspecified severity: Secondary | ICD-10-CM | POA: Diagnosis not present

## 2016-08-22 DIAGNOSIS — F419 Anxiety disorder, unspecified: Secondary | ICD-10-CM | POA: Diagnosis not present

## 2016-08-22 DIAGNOSIS — F319 Bipolar disorder, unspecified: Secondary | ICD-10-CM | POA: Diagnosis not present

## 2016-08-22 DIAGNOSIS — Z01818 Encounter for other preprocedural examination: Secondary | ICD-10-CM | POA: Diagnosis not present

## 2016-08-22 DIAGNOSIS — E119 Type 2 diabetes mellitus without complications: Secondary | ICD-10-CM | POA: Diagnosis not present

## 2016-08-22 DIAGNOSIS — Z801 Family history of malignant neoplasm of trachea, bronchus and lung: Secondary | ICD-10-CM | POA: Diagnosis not present

## 2016-08-22 DIAGNOSIS — I25119 Atherosclerotic heart disease of native coronary artery with unspecified angina pectoris: Secondary | ICD-10-CM | POA: Diagnosis not present

## 2016-08-22 HISTORY — DX: Hypothyroidism, unspecified: E03.9

## 2016-08-22 LAB — BASIC METABOLIC PANEL
ANION GAP: 9 (ref 5–15)
BUN: 26 mg/dL — ABNORMAL HIGH (ref 6–20)
CALCIUM: 8.9 mg/dL (ref 8.9–10.3)
CHLORIDE: 105 mmol/L (ref 101–111)
CO2: 25 mmol/L (ref 22–32)
Creatinine, Ser: 1.36 mg/dL — ABNORMAL HIGH (ref 0.61–1.24)
GFR, EST NON AFRICAN AMERICAN: 54 mL/min — AB (ref >60)
Glucose, Bld: 143 mg/dL — ABNORMAL HIGH (ref 65–99)
POTASSIUM: 4.7 mmol/L (ref 3.5–5.1)
Sodium: 139 mmol/L (ref 135–145)

## 2016-08-22 LAB — CBC
HCT: 31.4 % — ABNORMAL LOW (ref 40.0–52.0)
Hemoglobin: 10.3 g/dL — ABNORMAL LOW (ref 13.0–18.0)
MCH: 28.3 pg (ref 26.0–34.0)
MCHC: 32.9 g/dL (ref 32.0–36.0)
MCV: 85.9 fL (ref 80.0–100.0)
PLATELETS: 332 10*3/uL (ref 150–440)
RBC: 3.66 MIL/uL — ABNORMAL LOW (ref 4.40–5.90)
RDW: 17.5 % — AB (ref 11.5–14.5)
WBC: 5.8 10*3/uL (ref 3.8–10.6)

## 2016-08-22 NOTE — Patient Instructions (Signed)
  Your procedure is scheduled on: Friday, Aug 25, 2016 Report to Same Day Surgery 2nd floor medical mall ([place] Entrance-take elevator on left to 2nd floor.  Check in with surgery information desk.) To find out your arrival time please call [phone] between 1PM - 3PM on Thursday, Aug 24, 2016  Remember: Instructions that are not followed completely may result in serious medical risk, up to and including death, or upon the discretion of your surgeon and anesthesiologist your surgery may need to be rescheduled.    _x___ 1. Do not eat food or drink liquids after midnight. No gum chewing or hard candies.      __x__ 2. No Alcohol for 24 hours before or after surgery.   __x__3. No Smoking for 24 prior to surgery.   ____  4. Bring all medications with you on the day of surgery if instructed.    __x__ 5. Notify your doctor if there is any change in your medical condition     (cold, fever, infections).     Do not wear jewelry, make-up, hairpins, clips or nail polish.  Do not wear lotions, powders, or perfumes. You may wear deodorant.  Do not shave 48 hours prior to surgery. Men may shave face and neck.  Do not bring valuables to the hospital.    [facility] is not responsible for any belongings or valuables.   Patients discharged the day of surgery will not be allowed to drive home.  You will need someone to drive you home and stay with you the night of your procedure.    Please read over the following fact sheets that you were given:   [facility] Preparing for Surgery and or MRSA Information   _x___ Take this medication the morning of surgery with a SIP of water:   1. METOPROLOL  2. DEPAKOTE  3. CYMBALTA  4. NEXIUM (TAKE ONE CAPSULE THE NIGHT BEFORE AND ONE CAPSULE THE MORNING OF SURGERY)  5. IMDUR  6. SYNTHROID   _x___ Use CHG Soap or sage wipes as directed on instruction sheet   __X__ Stop Metformin 2 days prior to surgery. (LAST DOSE WILL BE Tuesday  (08/22/2016)    __X__  DO NOT WEAR V-GO (DISPOSABLE INSULIN DEVICE) on the morning surgery.   _x___ Follow recommendations from Cardiologist, Pulmonologist or PCP regarding Plavix ,Eliquis.  X____Stop Anti-inflammatories such as Advil, Aleve, Ibuprofen, Motrin, Naproxen, Naprosyn, Goodies powders or aspirin products. OK to take Tylenol.   _x___ Stop supplements until after surgery.  __x_ Bring C-Pap to the hospital.

## 2016-08-22 NOTE — Pre-Procedure Instructions (Signed)
Given and instructed on the use of incentive spirometer postoperatively. Patient acknowledges understanding.

## 2016-08-23 NOTE — Pre-Procedure Instructions (Signed)
Met B and CBC results sent to Dr. Cleda Mccreedy and Anesthesia for review.

## 2016-08-24 ENCOUNTER — Encounter: Payer: Self-pay | Admitting: Psychiatry

## 2016-08-24 ENCOUNTER — Ambulatory Visit (INDEPENDENT_AMBULATORY_CARE_PROVIDER_SITE_OTHER): Payer: 59 | Admitting: Psychiatry

## 2016-08-24 VITALS — BP 110/70 | HR 69 | Wt 279.8 lb

## 2016-08-24 DIAGNOSIS — F3181 Bipolar II disorder: Secondary | ICD-10-CM

## 2016-08-24 MED ORDER — DIVALPROEX SODIUM ER 500 MG PO TB24: 500.0000 mg | ORAL_TABLET | Freq: Two times a day (BID) | ORAL | 1 refills | Status: AC

## 2016-08-24 MED ORDER — DULOXETINE HCL 60 MG PO CPEP: 60.0000 mg | ORAL_CAPSULE | Freq: Every day | ORAL | 1 refills | Status: AC

## 2016-08-24 MED ORDER — CEFAZOLIN SODIUM-DEXTROSE 2-4 GM/100ML-% IV SOLN
2.0000 g | INTRAVENOUS | Status: AC
Start: 2016-08-25 — End: 2016-08-25
  Administered 2016-08-25: 2 g via INTRAVENOUS

## 2016-08-24 NOTE — Progress Notes (Signed)
Follow-up 66 year old man with chronic mood instability. He has no new complaints today. Reports that his mood has actually been quite good. Not manic just stable. No major depression. Feels comfortable with his current health and living situation. No suicidal ideation no anger outbursts. Tolerating medicine well.  Neatly dressed man looks his stated age. Pleasant in the interview. Appropriate speech. Thoughts lucid. Affect euthymic. Alert and oriented 4 good insight.  No change to current medicine. Supportive therapy. Encourage him with his current level of activity. Follow-up 4 months.

## 2016-08-25 ENCOUNTER — Encounter: Payer: Self-pay | Admitting: *Deleted

## 2016-08-25 ENCOUNTER — Ambulatory Visit: Payer: Medicare Other | Admitting: Certified Registered"

## 2016-08-25 ENCOUNTER — Encounter
Admission: RE | Disposition: A | Discharge status: Home / Self Care | Payer: Self-pay | Source: Ambulatory Visit | Attending: Podiatry

## 2016-08-25 ENCOUNTER — Ambulatory Visit
Admission: RE | Admit: 2016-08-25 | Discharge: 2016-08-25 | Disposition: A | Discharge status: Home / Self Care | Payer: Medicare Other | Source: Ambulatory Visit | Attending: Podiatry | Admitting: Podiatry

## 2016-08-25 DIAGNOSIS — Z8249 Family history of ischemic heart disease and other diseases of the circulatory system: Secondary | ICD-10-CM | POA: Insufficient documentation

## 2016-08-25 DIAGNOSIS — I110 Hypertensive heart disease with heart failure: Secondary | ICD-10-CM | POA: Insufficient documentation

## 2016-08-25 DIAGNOSIS — Z801 Family history of malignant neoplasm of trachea, bronchus and lung: Secondary | ICD-10-CM | POA: Diagnosis not present

## 2016-08-25 DIAGNOSIS — L97521 Non-pressure chronic ulcer of other part of left foot limited to breakdown of skin: Secondary | ICD-10-CM | POA: Diagnosis not present

## 2016-08-25 DIAGNOSIS — I25119 Atherosclerotic heart disease of native coronary artery with unspecified angina pectoris: Secondary | ICD-10-CM | POA: Diagnosis not present

## 2016-08-25 DIAGNOSIS — M2042 Other hammer toe(s) (acquired), left foot: Secondary | ICD-10-CM | POA: Insufficient documentation

## 2016-08-25 DIAGNOSIS — I130 Hypertensive heart and chronic kidney disease with heart failure and stage 1 through stage 4 chronic kidney disease, or unspecified chronic kidney disease: Secondary | ICD-10-CM | POA: Diagnosis not present

## 2016-08-25 DIAGNOSIS — E119 Type 2 diabetes mellitus without complications: Secondary | ICD-10-CM | POA: Diagnosis not present

## 2016-08-25 DIAGNOSIS — F419 Anxiety disorder, unspecified: Secondary | ICD-10-CM | POA: Insufficient documentation

## 2016-08-25 DIAGNOSIS — E1142 Type 2 diabetes mellitus with diabetic polyneuropathy: Secondary | ICD-10-CM | POA: Diagnosis not present

## 2016-08-25 DIAGNOSIS — E11621 Type 2 diabetes mellitus with foot ulcer: Secondary | ICD-10-CM | POA: Diagnosis not present

## 2016-08-25 DIAGNOSIS — I739 Peripheral vascular disease, unspecified: Secondary | ICD-10-CM | POA: Diagnosis not present

## 2016-08-25 DIAGNOSIS — K219 Gastro-esophageal reflux disease without esophagitis: Secondary | ICD-10-CM | POA: Diagnosis not present

## 2016-08-25 DIAGNOSIS — F319 Bipolar disorder, unspecified: Secondary | ICD-10-CM | POA: Insufficient documentation

## 2016-08-25 DIAGNOSIS — N189 Chronic kidney disease, unspecified: Secondary | ICD-10-CM | POA: Diagnosis not present

## 2016-08-25 DIAGNOSIS — I509 Heart failure, unspecified: Secondary | ICD-10-CM | POA: Insufficient documentation

## 2016-08-25 DIAGNOSIS — Z818 Family history of other mental and behavioral disorders: Secondary | ICD-10-CM | POA: Insufficient documentation

## 2016-08-25 DIAGNOSIS — L97522 Non-pressure chronic ulcer of other part of left foot with fat layer exposed: Secondary | ICD-10-CM | POA: Diagnosis not present

## 2016-08-25 DIAGNOSIS — L97529 Non-pressure chronic ulcer of other part of left foot with unspecified severity: Secondary | ICD-10-CM | POA: Insufficient documentation

## 2016-08-25 HISTORY — PX: AMPUTATION TOE: SHX6595

## 2016-08-25 LAB — GLUCOSE, CAPILLARY
GLUCOSE-CAPILLARY: 73 mg/dL (ref 65–99)
GLUCOSE-CAPILLARY: 86 mg/dL (ref 65–99)
GLUCOSE-CAPILLARY: 190 mg/dL — AB (ref 65–99)

## 2016-08-25 SURGERY — AMPUTATION, TOE
Anesthesia: General | Site: Toe | Laterality: Left | Wound class: Dirty or Infected

## 2016-08-25 MED ORDER — GLYCOPYRROLATE 0.2 MG/ML IJ SOLN
INTRAMUSCULAR | Status: AC
  Filled 2016-08-25: qty 1

## 2016-08-25 MED ORDER — MIDAZOLAM HCL 2 MG/2ML IJ SOLN
INTRAMUSCULAR | Status: AC
  Filled 2016-08-25: qty 2

## 2016-08-25 MED ORDER — LIDOCAINE HCL (PF) 2 % IJ SOLN
INTRAMUSCULAR | Status: AC
  Filled 2016-08-25: qty 2

## 2016-08-25 MED ORDER — MIDAZOLAM HCL 5 MG/5ML IJ SOLN
INTRAMUSCULAR | Status: DC | PRN
  Administered 2016-08-25: 2 mg via INTRAVENOUS

## 2016-08-25 MED ORDER — FENTANYL CITRATE (PF) 100 MCG/2ML IJ SOLN
INTRAMUSCULAR | Status: AC
Start: 2016-08-25 — End: 2016-08-25
  Filled 2016-08-25: qty 2

## 2016-08-25 MED ORDER — ONDANSETRON HCL 4 MG/2ML IJ SOLN
INTRAMUSCULAR | Status: AC
  Filled 2016-08-25: qty 2

## 2016-08-25 MED ORDER — LIDOCAINE HCL (PF) 2 % IJ SOLN
INTRAMUSCULAR | Status: DC | PRN
Start: 2016-08-25 — End: 2016-08-25
  Administered 2016-08-25: 50 mg

## 2016-08-25 MED ORDER — SODIUM CHLORIDE 0.9 % IV SOLN
INTRAVENOUS | Status: DC
  Administered 2016-08-25: 11:00:00 via INTRAVENOUS

## 2016-08-25 MED ORDER — CHLORHEXIDINE GLUCONATE 4 % EX LIQD: 60.0000 mL | Freq: Once | CUTANEOUS | Status: DC

## 2016-08-25 MED ORDER — OXYCODONE HCL 5 MG/5ML PO SOLN: 5.0000 mg | Freq: Once | ORAL | Status: DC | PRN

## 2016-08-25 MED ORDER — OXYCODONE HCL 5 MG PO TABS: 5.0000 mg | ORAL_TABLET | Freq: Once | ORAL | Status: DC | PRN

## 2016-08-25 MED ORDER — ONDANSETRON HCL 4 MG/2ML IJ SOLN
INTRAMUSCULAR | Status: DC | PRN
  Administered 2016-08-25: 4 mg via INTRAVENOUS

## 2016-08-25 MED ORDER — GLYCOPYRROLATE 0.2 MG/ML IJ SOLN
INTRAMUSCULAR | Status: DC | PRN
  Administered 2016-08-25: 0.2 mg via INTRAVENOUS

## 2016-08-25 MED ORDER — PROPOFOL 10 MG/ML IV BOLUS
INTRAVENOUS | Status: AC
  Filled 2016-08-25: qty 20

## 2016-08-25 MED ORDER — NEOMYCIN-POLYMYXIN B GU 40-200000 IR SOLN
Status: AC
  Filled 2016-08-25: qty 2

## 2016-08-25 MED ORDER — DEXTROSE IN LACTATED RINGERS 5 % IV SOLN
INTRAVENOUS | Status: DC
  Administered 2016-08-25: 10:00:00 via INTRAVENOUS

## 2016-08-25 MED ORDER — BUPIVACAINE HCL 0.5 % IJ SOLN
INTRAMUSCULAR | Status: DC | PRN
  Administered 2016-08-25: 7 mL

## 2016-08-25 MED ORDER — BUPIVACAINE HCL (PF) 0.5 % IJ SOLN
INTRAMUSCULAR | Status: AC
  Filled 2016-08-25: qty 30

## 2016-08-25 MED ORDER — NEOMYCIN-POLYMYXIN B GU 40-200000 IR SOLN
Status: DC | PRN
  Administered 2016-08-25: 2 mL

## 2016-08-25 MED ORDER — FENTANYL CITRATE (PF) 100 MCG/2ML IJ SOLN: 25.0000 ug | INTRAMUSCULAR | Status: DC | PRN

## 2016-08-25 MED ORDER — FENTANYL CITRATE (PF) 100 MCG/2ML IJ SOLN
INTRAMUSCULAR | Status: DC | PRN
  Administered 2016-08-25 (×2): 50 ug via INTRAVENOUS

## 2016-08-25 MED ORDER — HYDROCODONE-ACETAMINOPHEN 5-325 MG PO TABS
1.0000 | ORAL_TABLET | ORAL | 0 refills | Status: DC | PRN
Start: 2016-08-25 — End: 2016-11-27

## 2016-08-25 MED ORDER — EPHEDRINE SULFATE 50 MG/ML IJ SOLN
INTRAMUSCULAR | Status: DC | PRN
  Administered 2016-08-25 (×2): 10 mg via INTRAVENOUS

## 2016-08-25 MED ORDER — LIDOCAINE HCL (PF) 1 % IJ SOLN
INTRAMUSCULAR | Status: AC
  Filled 2016-08-25: qty 30

## 2016-08-25 MED ORDER — PROPOFOL 10 MG/ML IV BOLUS
INTRAVENOUS | Status: DC | PRN
  Administered 2016-08-25: 150 mg via INTRAVENOUS

## 2016-08-25 MED ORDER — SODIUM CHLORIDE 0.9 % IV SOLN: INTRAVENOUS | Status: DC

## 2016-08-25 MED ORDER — CEFAZOLIN SODIUM-DEXTROSE 2-4 GM/100ML-% IV SOLN
INTRAVENOUS | Status: AC
  Administered 2016-08-25: 2 g via INTRAVENOUS
  Filled 2016-08-25: qty 100

## 2016-08-25 MED ORDER — DEXAMETHASONE SODIUM PHOSPHATE 10 MG/ML IJ SOLN
INTRAMUSCULAR | Status: DC | PRN
  Administered 2016-08-25: 5 mg via INTRAVENOUS

## 2016-08-25 SURGICAL SUPPLY — 48 items
BANDAGE ACE 4X5 VEL STRL LF (GAUZE/BANDAGES/DRESSINGS) ×3 IMPLANT
BLADE MED AGGRESSIVE (BLADE) ×3 IMPLANT
BLADE OSC/SAGITTAL MD 5.5X18 (BLADE) ×3 IMPLANT
BLADE SURG 15 STRL LF DISP TIS (BLADE) ×2 IMPLANT
BLADE SURG 15 STRL SS (BLADE) ×6
BLADE SURG MINI STRL (BLADE) ×3 IMPLANT
BNDG CMPR 75X21 PLY HI ABS (MISCELLANEOUS) ×1
BNDG ESMARK 4X12 TAN STRL LF (GAUZE/BANDAGES/DRESSINGS) ×3 IMPLANT
BNDG GAUZE 4.5X4.1 6PLY STRL (MISCELLANEOUS) ×3 IMPLANT
CANISTER SUCT 1200ML W/VALVE (MISCELLANEOUS) ×3 IMPLANT
CLOSURE WOUND 1/4X4 (GAUZE/BANDAGES/DRESSINGS) ×1
CUFF TOURN 18 STER (MISCELLANEOUS) ×3 IMPLANT
CUFF TOURN DUAL PL 12 NO SLV (MISCELLANEOUS) ×3 IMPLANT
DRAPE FLUOR MINI C-ARM 54X84 (DRAPES) ×3 IMPLANT
DURAPREP 26ML APPLICATOR (WOUND CARE) ×3 IMPLANT
ELECT REM PT RETURN 9FT ADLT (ELECTROSURGICAL) ×3
ELECTRODE REM PT RTRN 9FT ADLT (ELECTROSURGICAL) ×1 IMPLANT
GAUZE PETRO XEROFOAM 1X8 (MISCELLANEOUS) ×3 IMPLANT
GAUZE SPONGE 4X4 12PLY STRL (GAUZE/BANDAGES/DRESSINGS) ×3 IMPLANT
GAUZE STRETCH 2X75IN STRL (MISCELLANEOUS) ×3 IMPLANT
GLOVE BIO SURGEON STRL SZ7.5 (GLOVE) ×3 IMPLANT
GLOVE INDICATOR 8.0 STRL GRN (GLOVE) ×3 IMPLANT
GOWN STRL REUS W/ TWL LRG LVL3 (GOWN DISPOSABLE) ×2 IMPLANT
GOWN STRL REUS W/TWL LRG LVL3 (GOWN DISPOSABLE) ×6
HANDPIECE VERSAJET DEBRIDEMENT (MISCELLANEOUS) ×3 IMPLANT
KIT RM TURNOVER STRD PROC AR (KITS) ×3 IMPLANT
LABEL OR SOLS (LABEL) ×3 IMPLANT
NDL FILTER BLUNT 18X1 1/2 (NEEDLE) ×1 IMPLANT
NDL HYPO 25X1 1.5 SAFETY (NEEDLE) ×2 IMPLANT
NEEDLE FILTER BLUNT 18X 1/2SAF (NEEDLE) ×2
NEEDLE FILTER BLUNT 18X1 1/2 (NEEDLE) ×1 IMPLANT
NEEDLE HYPO 25X1 1.5 SAFETY (NEEDLE) ×6 IMPLANT
NS IRRIG 500ML POUR BTL (IV SOLUTION) ×3 IMPLANT
PACK EXTREMITY ARMC (MISCELLANEOUS) ×3 IMPLANT
SOL .9 NS 3000ML IRR  AL (IV SOLUTION) ×2
SOL .9 NS 3000ML IRR AL (IV SOLUTION) ×1
SOL .9 NS 3000ML IRR UROMATIC (IV SOLUTION) ×1 IMPLANT
SOL PREP PVP 2OZ (MISCELLANEOUS) ×3
SOLUTION PREP PVP 2OZ (MISCELLANEOUS) ×1 IMPLANT
STOCKINETTE STRL 6IN 960660 (GAUZE/BANDAGES/DRESSINGS) ×3 IMPLANT
STRIP CLOSURE SKIN 1/4X4 (GAUZE/BANDAGES/DRESSINGS) ×2 IMPLANT
SUT ETHILON 3-0 FS-10 30 BLK (SUTURE) ×3
SUT ETHILON 4-0 (SUTURE)
SUT ETHILON 4-0 FS2 18XMFL BLK (SUTURE)
SUTURE EHLN 3-0 FS-10 30 BLK (SUTURE) ×1 IMPLANT
SUTURE ETHLN 4-0 FS2 18XMF BLK (SUTURE) IMPLANT
SWAB DUAL CULTURE TRANS RED ST (MISCELLANEOUS) ×3 IMPLANT
SYRINGE 10CC LL (SYRINGE) ×3 IMPLANT

## 2016-08-25 NOTE — Discharge Instructions (Addendum)
1. Elevate left lower extremity on pillows.  2. Keep the bandage on the left foot clean, dry, and do not remove.  3. Sponge bathe only left lower extremity.  4. Wear the surgical shoe on the left foot whenever walking or standing.  5. Take one pain pill, Norco, every 4 hours if needed for pain.  AMBULATORY SURGERY  DISCHARGE INSTRUCTIONS   1) The drugs that you were given will stay in your system until tomorrow so for the next 24 hours you should not:  A) Drive an automobile B) Make any legal decisions C) Drink any alcoholic beverage   2) You may resume regular meals tomorrow.  Today it is better to start with liquids and gradually work up to solid foods.  You may eat anything you prefer, but it is better to start with liquids, then soup and crackers, and gradually work up to solid foods.   3) Please notify your doctor immediately if you have any unusual bleeding, trouble breathing, redness and pain at the surgery site, drainage, fever, or pain not relieved by medication.    4) Additional Instructions: TAKE A STOOL SOFTENER TWICE A DAY WHILE TAKING NARCOTIC PAIN MEDICINE TO PREVENT CONSTIPATION   Please contact your physician with any problems or Same Day Surgery at [phone], Monday through Friday 6 am to 4 pm, or [place] at [facility] number at [phone].

## 2016-08-25 NOTE — Anesthesia Post-op Follow-up Note (Signed)
Anesthesia QCDR form completed.        

## 2016-08-25 NOTE — Anesthesia Procedure Notes (Signed)
Procedure Name: LMA Insertion Performed by: Andria Frames Pre-anesthesia Checklist: Patient identified, Patient being monitored, Timeout performed, Emergency Drugs available and Suction available Patient Re-evaluated:Patient Re-evaluated prior to inductionOxygen Delivery Method: Circle system utilized Preoxygenation: Pre-oxygenation with 100% oxygen Intubation Type: IV induction Ventilation: Mask ventilation without difficulty LMA: LMA inserted LMA Size: 4.5 Tube type: Oral Number of attempts: 1 Placement Confirmation: positive ETCO2 and breath sounds checked- equal and bilateral Tube secured with: Tape Dental Injury: Teeth and Oropharynx as per pre-operative assessment

## 2016-08-25 NOTE — Interval H&P Note (Signed)
History and Physical Interval Note:  08/25/2016 9:07 AM  Dennis Mullen  has presented today for surgery, with the diagnosis of L97.521 ulcer/m20.42,hammertoe  The various methods of treatment have been discussed with the patient and family. After consideration of risks, benefits and other options for treatment, the patient has consented to  Procedure(s): AMPUTATION TOE/ipj left 2nd toe (Left) as a surgical intervention .  The patient's history has been reviewed, patient examined, no change in status, stable for surgery.  I have reviewed the patient's chart and labs.  Questions were answered to the patient's satisfaction.     Durward Fortes

## 2016-08-25 NOTE — Anesthesia Preprocedure Evaluation (Addendum)
Anesthesia Evaluation  Patient identified by MRN, date of birth, ID band Patient awake    Reviewed: Allergy & Precautions, H&P , NPO status , Patient's Chart, lab work & pertinent test results  History of Anesthesia Complications Negative for: history of anesthetic complications  Airway Mallampati: III  TM Distance: <3 FB Neck ROM: limited    Dental  (+) Poor Dentition, Chipped, Caps   Pulmonary neg shortness of breath, sleep apnea and Continuous Positive Airway Pressure Ventilation ,    Pulmonary exam normal breath sounds clear to auscultation       Cardiovascular Exercise Tolerance: Good hypertension, + angina + CAD, + Past MI, + Cardiac Stents, + Peripheral Vascular Disease and +CHF  Normal cardiovascular exam Rhythm:regular Rate:Normal     Neuro/Psych PSYCHIATRIC DISORDERS Anxiety Depression Bipolar Disorder negative neurological ROS     GI/Hepatic Neg liver ROS, GERD  Controlled and Medicated,  Endo/Other  diabetes, Type 2Hypothyroidism   Renal/GU CRFRenal disease     Musculoskeletal   Abdominal   Peds  Hematology negative hematology ROS (+)   Anesthesia Other Findings Past Medical History: No date: Abnormal levels of other serum enzymes No date: Anxiety No date: Bipolar disorder ([place]) No date: BPH (benign prostatic hyperplasia) No date: CAD (coronary artery disease) No date: CHF (congestive heart failure) ([facility])     Comment: ? No date: Depression No date: Diabetes mellitus, type II ([place]) No date: Diabetes mellitus, type II, insulin dependent * No date: ED (erectile dysfunction) No date: GERD (gastroesophageal reflux disease) No date: Gout No date: History of borderline personality disorder No date: Hypertension No date: Hypogonadism in male No date: Hypothyroidism No date: Insomnia No date: Mood disorder ([facility]) No date: Neuropathy No date: Obesity No date: OSA (obstructive sleep apnea) No date:  PVD (peripheral vascular disease) ([place]) No date: Thyroid disease  Past Surgical History: 10/12/2015: CARDIAC CATHETERIZATION N/A     Comment: Procedure: Left Heart Cath and Coronary               Angiography;  Surgeon: Teodoro Spray, MD;                Location: [place] [facility];  Service:               Cardiovascular;  Laterality: N/A; No date: CORONARY ANGIOPLASTY WITH STENT PLACEMENT     Comment: x 6 stents No date: FOOT SURGERY Right     Comment: fracture repair with screw 06/01/2016: LEFT HEART CATH AND CORONARY ANGIOGRAPHY Right     Comment: Procedure: Left Heart Cath and Coronary               Angiography;  Surgeon: Isaias Cowman, MD;              Location: [place] [facility];  Service:               Cardiovascular;  Laterality: Right; No date: MASTOIDECTOMY Right No date: NASAL SEPTUM SURGERY No date: penial inplant No date: PENILE PROSTHESIS IMPLANT No date: right mastoidectomy No date: SCROTOPLASTY No date: TONSILLECTOMY AND ADENOIDECTOMY     Reproductive/Obstetrics negative OB ROS                             Anesthesia Physical Anesthesia Plan  ASA: III  Anesthesia Plan: General LMA   Post-op Pain Management:    Induction: Intravenous  Airway Management Planned: LMA  Additional Equipment:   Intra-op Plan:  Post-operative Plan: Extubation in OR  Informed Consent: I have reviewed the patients History and Physical, chart, labs and discussed the procedure including the risks, benefits and alternatives for the proposed anesthesia with the patient or authorized representative who has indicated his/her understanding and acceptance.   Dental Advisory Given  Plan Discussed with: Anesthesiologist, CRNA and Surgeon  Anesthesia Plan Comments: (Patient reports that he has someone to watch him at home for 24 hours following his GA  Patient consented for risks of anesthesia including but not limited to:  - adverse reactions to  medications - damage to teeth, lips or other oral mucosa - sore throat or hoarseness - Damage to heart, brain, lungs or loss of life  Patient voiced understanding.)      Anesthesia Quick Evaluation

## 2016-08-25 NOTE — Transfer of Care (Signed)
Immediate Anesthesia Transfer of Care Note  Patient: Dennis Mullen  Procedure(s) Performed: Procedure(s): AMPUTATION TOE/ipj left 2nd toe (Left)  Patient Location: PACU  Anesthesia Type:General  Level of Consciousness: sedated  Airway & Oxygen Therapy: Patient Spontanous Breathing and Patient connected to face mask oxygen  Post-op Assessment: Report given to RN and Post -op Vital signs reviewed and stable  Post vital signs: Reviewed  Last Vitals:  Vitals:   08/25/16 0828 08/25/16 1032  BP: (!) 185/80 127/65  Pulse: 71 61  Resp: 16 16  Temp: (!) 35.9 C 36.6 C    Last Pain:  Vitals:   08/25/16 0828  TempSrc: Tympanic  PainSc: 0-No pain         Complications: No apparent anesthesia complications

## 2016-08-25 NOTE — Anesthesia Postprocedure Evaluation (Signed)
Anesthesia Post Note  Patient: Dennis Mullen  Procedure(s) Performed: Procedure(s) (LRB): AMPUTATION TOE/ipj left 2nd toe (Left)  Patient location during evaluation: PACU Anesthesia Type: General Level of consciousness: awake and alert Pain management: pain level controlled Vital Signs Assessment: post-procedure vital signs reviewed and stable Respiratory status: spontaneous breathing, nonlabored ventilation, respiratory function stable and patient connected to nasal cannula oxygen Cardiovascular status: blood pressure returned to baseline and stable Postop Assessment: no signs of nausea or vomiting Anesthetic complications: no     Last Vitals:  Vitals:   08/25/16 1217 08/25/16 1247  BP: (!) 155/68 132/62  Pulse: (!) 59 (!) 59  Resp: 16 16  Temp:      Last Pain:  Vitals:   08/25/16 1150  TempSrc: Temporal  PainSc:                  Precious Haws Yazan Gatling

## 2016-08-25 NOTE — H&P (Signed)
Medical history and physical in the chart was reviewed. No interval changes. Patient stable for surgery

## 2016-08-25 NOTE — Op Note (Signed)
Date of operation: 08/25/2016.  Surgeon: Durward Fortes DPM.  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  Postoperative diagnosis: Same.  Procedure: Distal amputation left second toe.  Anesthesia: LMA with local.  Hemostasis: None.  Estimated blood loss: Less than 5 cc.  Materials: None.  Pathology: Distal tip left second toe.  Consultations: None apparent.  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 Conservative outpatient treatment with offloading and wound care has not been successful and he elects for distal amputation of the second toe.  Operative procedure: Patient was taken to the operating room and placed on the table in the supine position. Following satisfactory LMA anesthesia the left second toe area and forefoot was anesthetized with 7 cc of 0.5% bupivacaine plain. The left foot was then prepped and draped in usual sterile fashion.   Attention was then directed to the distal aspect of the left second toe where a fishmouth type incision was made coursing medial to lateral at the base of the toenail plate area and ulceration. The incision was carried sharply down to the level of the bone and periosteal dissection carried back to the level of the distal interphalangeal joint where the distal aspect of the toe was disarticulated and removed in toto. Good healthy tissues were noted but there also was noted to be some tension on the skin edges when reapproximated so the distal aspect of the middle phalanx and cartilage was then resected using bone nippers. The edges were rasped smooth. At this point good closure was obtainable and the wound was flushed with copious amounts of sterile saline and the incision was then closed using 4-0 nylon simple interrupted sutures. Xeroform 4 x 4's and a sterile gauze bandage was applied followed by Kerlix and an Ace wrap.  Patient tolerated the procedure and anesthesia well and was awakened and transferred to the PACU with vital signs stable and in good condition.

## 2016-08-26 ENCOUNTER — Encounter: Payer: Self-pay | Admitting: Podiatry

## 2016-08-29 LAB — SURGICAL PATHOLOGY

## 2016-09-06 ENCOUNTER — Telehealth: Payer: Self-pay | Admitting: Psychiatry

## 2016-09-06 DIAGNOSIS — E1151 Type 2 diabetes mellitus with diabetic peripheral angiopathy without gangrene: Secondary | ICD-10-CM | POA: Diagnosis not present

## 2016-09-06 DIAGNOSIS — E782 Mixed hyperlipidemia: Secondary | ICD-10-CM | POA: Diagnosis not present

## 2016-09-06 DIAGNOSIS — Z125 Encounter for screening for malignant neoplasm of prostate: Secondary | ICD-10-CM | POA: Diagnosis not present

## 2016-09-06 DIAGNOSIS — Z0001 Encounter for general adult medical examination with abnormal findings: Secondary | ICD-10-CM | POA: Diagnosis not present

## 2016-09-06 DIAGNOSIS — D649 Anemia, unspecified: Secondary | ICD-10-CM | POA: Diagnosis not present

## 2016-09-06 NOTE — Telephone Encounter (Signed)
I will look forward to seeing the results. Thank you.

## 2016-09-25 DIAGNOSIS — I10 Essential (primary) hypertension: Secondary | ICD-10-CM | POA: Diagnosis not present

## 2016-09-25 DIAGNOSIS — E1151 Type 2 diabetes mellitus with diabetic peripheral angiopathy without gangrene: Secondary | ICD-10-CM | POA: Diagnosis not present

## 2016-09-25 DIAGNOSIS — R251 Tremor, unspecified: Secondary | ICD-10-CM | POA: Diagnosis not present

## 2016-09-25 DIAGNOSIS — G4733 Obstructive sleep apnea (adult) (pediatric): Secondary | ICD-10-CM | POA: Diagnosis not present

## 2016-09-25 DIAGNOSIS — Z0001 Encounter for general adult medical examination with abnormal findings: Secondary | ICD-10-CM | POA: Diagnosis not present

## 2016-09-28 ENCOUNTER — Ambulatory Visit (INDEPENDENT_AMBULATORY_CARE_PROVIDER_SITE_OTHER): Payer: Medicare Other | Admitting: Vascular Surgery

## 2016-09-28 ENCOUNTER — Encounter (INDEPENDENT_AMBULATORY_CARE_PROVIDER_SITE_OTHER): Payer: Self-pay | Admitting: Vascular Surgery

## 2016-09-28 VITALS — BP 107/61 | HR 67 | Resp 16 | Ht 74.0 in | Wt 271.0 lb

## 2016-09-28 DIAGNOSIS — E782 Mixed hyperlipidemia: Secondary | ICD-10-CM | POA: Diagnosis not present

## 2016-09-28 DIAGNOSIS — I25119 Atherosclerotic heart disease of native coronary artery with unspecified angina pectoris: Secondary | ICD-10-CM | POA: Diagnosis not present

## 2016-09-28 DIAGNOSIS — I10 Essential (primary) hypertension: Secondary | ICD-10-CM | POA: Diagnosis not present

## 2016-09-28 DIAGNOSIS — I890 Lymphedema, not elsewhere classified: Secondary | ICD-10-CM | POA: Diagnosis not present

## 2016-09-28 NOTE — Progress Notes (Signed)
MRN : [id number]  Dennis Mullen is a 66 y.o. (06-Oct-1950) male who presents with chief complaint of  Chief Complaint  Patient presents with  . Re-evaluation    6 month no studies  .  History of Present Illness: The patient returns to the office for followup evaluation regarding leg swelling.  The swelling has improved quite a bit and the pain associated with swelling has decreased substantially. There have not been any interval development of a ulcerations or wounds.  Since the previous visit the patient has been wearing graduated compression stockings and has noted some improvement in the lymphedema. The patient has been using compression routinely morning until night.  The patient also states elevation during the day and exercise is being done too.  He has been using his lymph pump with good success    Current Meds  Medication Sig  . allopurinol (ZYLOPRIM) 100 MG tablet Take 100 mg by mouth daily.   Marland Kitchen amLODipine (NORVASC) 5 MG tablet Take 5 mg by mouth at bedtime.   Marland Kitchen apixaban (ELIQUIS) 5 MG TABS tablet Take 1 tablet (5 mg total) by mouth 2 (two) times daily.  . clopidogrel (PLAVIX) 75 MG tablet Take 75 mg by mouth daily. Reported on 07/26/2015  . colchicine 0.6 MG tablet Take 0.6 mg by mouth 2 (two) times daily as needed (gout flare).   . Continuous Blood Gluc Sensor (FREESTYLE LIBRE SENSOR SYSTEM) MISC Use 1 each as directed. Please place prior to office visit as directed  . diclofenac (VOLTAREN) 75 MG EC tablet Take 75 mg by mouth daily as needed (gout flare).   . divalproex (DEPAKOTE ER) 500 MG 24 hr tablet Take 1-2 tablets (500-1,000 mg total) by mouth 2 (two) times daily. Take 558m in morning and 10097min evening  . DULoxetine (CYMBALTA) 60 MG capsule Take 1 capsule (60 mg total) by mouth daily.  . Marland Kitchensomeprazole (NEXIUM) 40 MG capsule Take 40 mg by mouth daily.   . Marland Kitchensomeprazole (NEXIUM) 40 MG packet esomeprazole magnesium 40 mg capsule,delayed release  . fenofibrate  (TRICOR) 145 MG tablet Take 145 mg by mouth daily.   . furosemide (LASIX) 40 MG tablet Take 1 tablet (40 mg total) by mouth daily.  . Marland Kitchenemfibrozil (LOPID) 600 MG tablet gemfibrozil 600 mg tablet  . glucose 4 GM chewable tablet Chew 4 g by mouth as needed for low blood sugar.   . HYDROcodone-acetaminophen (NORCO/VICODIN) 5-325 MG tablet Take 1 tablet by mouth every 4 (four) hours as needed for moderate pain.  . Insulin Disposable Pump (V-GO 30) KIT Inject 1.2 Units/hr into the skin every hour. 66 units/24 hrs (36 available bolus units--12 bolus units available per meals)  . insulin lispro (HUMALOG KWIKPEN) 100 UNIT/ML KiwkPen Inject into the skin See admin instructions. Used ONLY as needed if additional bolus units are needed (up to 36 units/24 hrs.)  . isosorbide mononitrate (IMDUR) 60 MG 24 hr tablet Take 60 mg by mouth daily.   . Marland Kitchenevothyroxine (SYNTHROID, LEVOTHROID) 50 MCG tablet Take 50 mcg by mouth daily before breakfast.  . metFORMIN (GLUCOPHAGE) 1000 MG tablet Take 1,000 mg by mouth 2 (two) times daily with a meal. Morning & supper  . metoprolol succinate (TOPROL-XL) 50 MG 24 hr tablet Take 50 mg by mouth daily.   . Marland Kitcheneomycin-Bacitracin-Polymyxin ([employer] TRIPLE ANTIBIOTIC) [phone] OINT Apply topically.  . nitroGLYCERIN (NITROSTAT) 0.4 MG SL tablet Place 0.4 mg under the tongue every 5 (five) minutes x 3 doses as needed for chest  pain.   . pregabalin (LYRICA) 100 MG capsule Take 100 mg by mouth 3 (three) times daily before meals.   Marland Kitchen rOPINIRole (REQUIP) 1 MG tablet Take 1 mg by mouth 2 (two) times daily.   . rosuvastatin (CRESTOR) 20 MG tablet Take 20 mg by mouth at bedtime.   Marland Kitchen VICTOZA 18 MG/3ML SOPN Inject 1.8 mg into the skin daily.   . vitamin C (ASCORBIC ACID) 500 MG tablet Take 500 mg by mouth daily.    Past Medical History:  Diagnosis Date  . Abnormal levels of other serum enzymes   . Anxiety   . Bipolar disorder ([place])   . BPH (benign prostatic hyperplasia)   . CAD (coronary  artery disease)   . CHF (congestive heart failure) ([place])    ?  Marland Kitchen Depression   . Diabetes mellitus, type II ([place])   . Diabetes mellitus, type II, insulin dependent ([place])   . ED (erectile dysfunction)   . GERD (gastroesophageal reflux disease)   . Gout   . History of borderline personality disorder   . Hypertension   . Hypogonadism in male   . Hypothyroidism   . Insomnia   . Mood disorder ([place])   . Neuropathy   . Obesity   . OSA (obstructive sleep apnea)   . PVD (peripheral vascular disease) ([place])   . Thyroid disease     Past Surgical History:  Procedure Laterality Date  . AMPUTATION TOE Left 08/25/2016   Procedure: AMPUTATION TOE/ipj left 2nd toe;  Surgeon: Sharlotte Alamo, DPM;  Location: [facility];  Service: Podiatry;  Laterality: Left;  . CARDIAC CATHETERIZATION N/A 10/12/2015   Procedure: Left Heart Cath and Coronary Angiography;  Surgeon: Teodoro Spray, MD;  Location: [facility];  Service: Cardiovascular;  Laterality: N/A;  . CORONARY ANGIOPLASTY WITH STENT PLACEMENT     x 6 stents  . FOOT SURGERY Right    fracture repair with screw  . LEFT HEART CATH AND CORONARY ANGIOGRAPHY Right 06/01/2016   Procedure: Left Heart Cath and Coronary Angiography;  Surgeon: Isaias Cowman, MD;  Location: [facility];  Service: Cardiovascular;  Laterality: Right;  . MASTOIDECTOMY Right   . NASAL SEPTUM SURGERY    . penial inplant    . PENILE PROSTHESIS IMPLANT    . right mastoidectomy    . SCROTOPLASTY    . TONSILLECTOMY AND ADENOIDECTOMY      Social History Social History  Substance Use Topics  . Smoking status: Never Smoker  . Smokeless tobacco: Never Used  . Alcohol use No    Family History Family History  Problem Relation Age of Onset  . Lung cancer Mother   . Heart attack Father   . Post-traumatic stress disorder Father   . Anxiety disorder Father   . Depression Father   . Heart attack Sister   . Heart attack Brother   . Prostate cancer Neg Hx   .  Kidney cancer Neg Hx     No Known Allergies   REVIEW OF SYSTEMS (Negative unless checked)  Constitutional: [] Weight loss  [] Fever  [] Chills Cardiac: [] Chest pain   [] Chest pressure   [] Palpitations   [] Shortness of breath when laying flat   [] Shortness of breath with exertion. Vascular:  [] Pain in legs with walking   [] Pain in legs at rest  [] History of DVT   [] Phlebitis   [x] Swelling in legs   [] Varicose veins   [] Non-healing ulcers Pulmonary:   [] Uses home oxygen   [] Productive  cough   [] Hemoptysis   [] Wheeze  [] COPD   [] Asthma Neurologic:  [] Dizziness   [] Seizures   [] History of stroke   [] History of TIA  [] Aphasia   [] Vissual changes   [] Weakness or numbness in arm   [] Weakness or numbness in leg Musculoskeletal:   [x] Joint swelling   [x] Joint pain   [] Low back pain Hematologic:  [] Easy bruising  [] Easy bleeding   [] Hypercoagulable state   [] Anemic Gastrointestinal:  [] Diarrhea   [] Vomiting  [] Gastroesophageal reflux/heartburn   [] Difficulty swallowing. Genitourinary:  [] Chronic kidney disease   [] Difficult urination  [] Frequent urination   [] Blood in urine Skin:  [] Rashes   [] Ulcers  Psychological:  [] History of anxiety   []  History of major depression.  Physical Examination  There were no vitals filed for this visit. There is no height or weight on file to calculate BMI. Gen: WD/WN, NAD Head: [place]/AT, No temporalis wasting.  Ear/Nose/Throat: Hearing grossly intact, nares w/o erythema or drainage Eyes: PER, EOMI, sclera nonicteric.  Neck: Supple, no large masses.   Pulmonary:  Good air movement, no audible wheezing bilaterally, no use of accessory muscles.  Cardiac: RRR, no JVD Vascular: 2-3+ edema bilaterally with mild skin changes Vessel Right Left  Radial Palpable Palpable  PT Palpable Palpable  DP Palpable Palpable  Gastrointestinal: Non-distended. No guarding/no peritoneal signs.  Musculoskeletal: M/S 5/5 throughout.  No deformity or atrophy.  Neurologic: CN 2-12 intact.  Symmetrical.  Speech is fluent. Motor exam as listed above. Psychiatric: Judgment intact, Mood & affect appropriate for pt's clinical situation. Dermatologic: No rashes or ulcers noted.  No changes consistent with cellulitis. Lymph : No lichenification or skin changes of chronic lymphedema.  CBC Lab Results  Component Value Date   WBC 5.8 08/22/2016   HGB 10.3 (L) 08/22/2016   HCT 31.4 (L) 08/22/2016   MCV 85.9 08/22/2016   PLT 332 08/22/2016    BMET    Component Value Date/Time   NA 139 08/22/2016 1406   NA 139 01/26/2014 0503   K 4.7 08/22/2016 1406   K 4.7 01/26/2014 0503   CL 105 08/22/2016 1406   CL 103 01/26/2014 0503   CO2 25 08/22/2016 1406   CO2 29 01/26/2014 0503   GLUCOSE 143 (H) 08/22/2016 1406   GLUCOSE 123 (H) 01/26/2014 0503   BUN 26 (H) 08/22/2016 1406   BUN 20 (H) 01/26/2014 0503   CREATININE 1.36 (H) 08/22/2016 1406   CREATININE 1.36 (H) 01/26/2014 0503   CALCIUM 8.9 08/22/2016 1406   CALCIUM 8.7 01/26/2014 0503   GFRNONAA 54 (L) 08/22/2016 1406   GFRNONAA 57 (L) 01/26/2014 0503   GFRNONAA >60 11/28/2013 1002   GFRAA >60 08/22/2016 1406   GFRAA >60 01/26/2014 0503   GFRAA >60 11/28/2013 1002   CrCl cannot be calculated (Patient's most recent lab result is older than the maximum 21 days allowed.).  COAG Lab Results  Component Value Date   INR 1.11 06/01/2016   INR 1.07 12/20/2015   INR 1.20 10/12/2015    Radiology No results found.  Assessment/Plan 1. Lymphedema  No surgery or intervention at this point in time.    I have reviewed my discussion with the patient regarding lymphedema and why it  causes symptoms.  Patient will continue wearing graduated compression stockings class 1 (20-30 mmHg) on a daily basis a prescription was given. The patient is reminded to put the stockings on first thing in the morning and removing them in the evening. The patient is instructed specifically not  to sleep in the stockings.   In addition, behavioral  modification throughout the day will be continued.  This will include frequent elevation (such as in a recliner), use of over the counter pain medications as needed and exercise such as walking.  I have reviewed systemic causes for chronic edema such as liver, kidney and cardiac etiologies and there does not appear to be any significant changes in these organ systems over the past year.  The patient is under the impression that these organ systems are all stable and unchanged.    The patient will continue aggressive use of the  lymph pump.  This will continue to improve the edema control and prevent sequela such as ulcers and infections.   The patient will follow-up with me on an annual basis.    2. Elevated cholesterol with elevated triglycerides Continue statin as ordered and reviewed, no changes at this time   3. Coronary artery disease involving native coronary artery of native heart with angina pectoris ([place]) Continue cardiac and antihypertensive medications as already ordered and reviewed, no changes at this time.  Continue statin as ordered and reviewed, no changes at this time  Nitrates PRN for chest pain   4. Benign essential HTN Continue antihypertensive medications as already ordered, these medications have been reviewed and there are no changes at this time.     Hortencia Pilar, MD  09/28/2016 1:10 PM

## 2016-10-02 DIAGNOSIS — L97511 Non-pressure chronic ulcer of other part of right foot limited to breakdown of skin: Secondary | ICD-10-CM | POA: Diagnosis not present

## 2016-10-16 DIAGNOSIS — L578 Other skin changes due to chronic exposure to nonionizing radiation: Secondary | ICD-10-CM | POA: Diagnosis not present

## 2016-10-16 DIAGNOSIS — L570 Actinic keratosis: Secondary | ICD-10-CM | POA: Diagnosis not present

## 2016-10-16 DIAGNOSIS — L821 Other seborrheic keratosis: Secondary | ICD-10-CM | POA: Diagnosis not present

## 2016-10-16 DIAGNOSIS — B351 Tinea unguium: Secondary | ICD-10-CM | POA: Diagnosis not present

## 2016-10-16 DIAGNOSIS — L812 Freckles: Secondary | ICD-10-CM | POA: Diagnosis not present

## 2016-10-30 DIAGNOSIS — M19072 Primary osteoarthritis, left ankle and foot: Secondary | ICD-10-CM | POA: Diagnosis not present

## 2016-10-30 DIAGNOSIS — L97511 Non-pressure chronic ulcer of other part of right foot limited to breakdown of skin: Secondary | ICD-10-CM | POA: Diagnosis not present

## 2016-10-30 DIAGNOSIS — M79672 Pain in left foot: Secondary | ICD-10-CM | POA: Diagnosis not present

## 2016-10-30 DIAGNOSIS — M659 Synovitis and tenosynovitis, unspecified: Secondary | ICD-10-CM | POA: Diagnosis not present

## 2016-11-03 ENCOUNTER — Observation Stay
Admission: EM | Admit: 2016-11-03 | Discharge: 2016-11-04 | Disposition: A | Discharge status: Home / Self Care | Payer: Medicare Other | Attending: Internal Medicine | Admitting: Internal Medicine

## 2016-11-03 ENCOUNTER — Emergency Department: Payer: Medicare Other

## 2016-11-03 DIAGNOSIS — G4730 Sleep apnea, unspecified: Secondary | ICD-10-CM | POA: Diagnosis not present

## 2016-11-03 DIAGNOSIS — E039 Hypothyroidism, unspecified: Secondary | ICD-10-CM | POA: Diagnosis not present

## 2016-11-03 DIAGNOSIS — Z7901 Long term (current) use of anticoagulants: Secondary | ICD-10-CM | POA: Insufficient documentation

## 2016-11-03 DIAGNOSIS — G4700 Insomnia, unspecified: Secondary | ICD-10-CM | POA: Insufficient documentation

## 2016-11-03 DIAGNOSIS — I509 Heart failure, unspecified: Secondary | ICD-10-CM | POA: Insufficient documentation

## 2016-11-03 DIAGNOSIS — R0789 Other chest pain: Secondary | ICD-10-CM | POA: Diagnosis not present

## 2016-11-03 DIAGNOSIS — I4891 Unspecified atrial fibrillation: Secondary | ICD-10-CM | POA: Insufficient documentation

## 2016-11-03 DIAGNOSIS — E1151 Type 2 diabetes mellitus with diabetic peripheral angiopathy without gangrene: Secondary | ICD-10-CM | POA: Diagnosis not present

## 2016-11-03 DIAGNOSIS — E1140 Type 2 diabetes mellitus with diabetic neuropathy, unspecified: Secondary | ICD-10-CM | POA: Diagnosis not present

## 2016-11-03 DIAGNOSIS — Z79899 Other long term (current) drug therapy: Secondary | ICD-10-CM | POA: Diagnosis not present

## 2016-11-03 DIAGNOSIS — I350 Nonrheumatic aortic (valve) stenosis: Secondary | ICD-10-CM | POA: Diagnosis not present

## 2016-11-03 DIAGNOSIS — Z955 Presence of coronary angioplasty implant and graft: Secondary | ICD-10-CM | POA: Insufficient documentation

## 2016-11-03 DIAGNOSIS — N400 Enlarged prostate without lower urinary tract symptoms: Secondary | ICD-10-CM | POA: Insufficient documentation

## 2016-11-03 DIAGNOSIS — G4733 Obstructive sleep apnea (adult) (pediatric): Secondary | ICD-10-CM | POA: Insufficient documentation

## 2016-11-03 DIAGNOSIS — K219 Gastro-esophageal reflux disease without esophagitis: Secondary | ICD-10-CM | POA: Diagnosis not present

## 2016-11-03 DIAGNOSIS — G2581 Restless legs syndrome: Secondary | ICD-10-CM | POA: Insufficient documentation

## 2016-11-03 DIAGNOSIS — I2510 Atherosclerotic heart disease of native coronary artery without angina pectoris: Secondary | ICD-10-CM | POA: Diagnosis not present

## 2016-11-03 DIAGNOSIS — R748 Abnormal levels of other serum enzymes: Secondary | ICD-10-CM | POA: Diagnosis not present

## 2016-11-03 DIAGNOSIS — I110 Hypertensive heart disease with heart failure: Secondary | ICD-10-CM | POA: Insufficient documentation

## 2016-11-03 DIAGNOSIS — I25119 Atherosclerotic heart disease of native coronary artery with unspecified angina pectoris: Secondary | ICD-10-CM | POA: Diagnosis not present

## 2016-11-03 DIAGNOSIS — Z89422 Acquired absence of other left toe(s): Secondary | ICD-10-CM | POA: Insufficient documentation

## 2016-11-03 DIAGNOSIS — N529 Male erectile dysfunction, unspecified: Secondary | ICD-10-CM | POA: Insufficient documentation

## 2016-11-03 DIAGNOSIS — E785 Hyperlipidemia, unspecified: Secondary | ICD-10-CM | POA: Insufficient documentation

## 2016-11-03 DIAGNOSIS — I252 Old myocardial infarction: Secondary | ICD-10-CM | POA: Insufficient documentation

## 2016-11-03 DIAGNOSIS — Z6837 Body mass index (BMI) 37.0-37.9, adult: Secondary | ICD-10-CM | POA: Diagnosis not present

## 2016-11-03 DIAGNOSIS — Z7902 Long term (current) use of antithrombotics/antiplatelets: Secondary | ICD-10-CM | POA: Insufficient documentation

## 2016-11-03 DIAGNOSIS — R079 Chest pain, unspecified: Secondary | ICD-10-CM | POA: Diagnosis not present

## 2016-11-03 DIAGNOSIS — Z794 Long term (current) use of insulin: Secondary | ICD-10-CM | POA: Insufficient documentation

## 2016-11-03 LAB — COMPREHENSIVE METABOLIC PANEL
ALK PHOS: 84 U/L (ref 38–126)
ALT: 23 U/L (ref 17–63)
AST: 37 U/L (ref 15–41)
Albumin: 3.3 g/dL — ABNORMAL LOW (ref 3.5–5.0)
Anion gap: 6 (ref 5–15)
BILIRUBIN TOTAL: 0.6 mg/dL (ref 0.3–1.2)
BUN: 22 mg/dL — ABNORMAL HIGH (ref 6–20)
CALCIUM: 8.7 mg/dL — AB (ref 8.9–10.3)
CO2: 26 mmol/L (ref 22–32)
CREATININE: 1.02 mg/dL (ref 0.61–1.24)
Chloride: 108 mmol/L (ref 101–111)
GFR calc non Af Amer: >60 mL/min (ref >60)
Glucose, Bld: 198 mg/dL — ABNORMAL HIGH (ref 65–99)
Potassium: 4.9 mmol/L (ref 3.5–5.1)
SODIUM: 140 mmol/L (ref 135–145)
TOTAL PROTEIN: 7 g/dL (ref 6.5–8.1)

## 2016-11-03 LAB — CBC
HEMATOCRIT: 32 % — AB (ref 40.0–52.0)
HEMOGLOBIN: 10.4 g/dL — AB (ref 13.0–18.0)
MCH: 27.5 pg (ref 26.0–34.0)
MCHC: 32.4 g/dL (ref 32.0–36.0)
MCV: 85 fL (ref 80.0–100.0)
Platelets: 232 10*3/uL (ref 150–440)
RBC: 3.77 MIL/uL — AB (ref 4.40–5.90)
RDW: 18.9 % — ABNORMAL HIGH (ref 11.5–14.5)
WBC: 5.5 10*3/uL (ref 3.8–10.6)

## 2016-11-03 LAB — HEPARIN LEVEL (UNFRACTIONATED): HEPARIN UNFRACTIONATED: 0.35 [IU]/mL (ref 0.30–0.70)

## 2016-11-03 LAB — GLUCOSE, CAPILLARY
GLUCOSE-CAPILLARY: 228 mg/dL — AB (ref 65–99)
GLUCOSE-CAPILLARY: 146 mg/dL — AB (ref 65–99)

## 2016-11-03 LAB — APTT
APTT: 34 s (ref 24–36)
aPTT: 72 seconds — ABNORMAL HIGH (ref 24–36)

## 2016-11-03 LAB — TROPONIN I
TROPONIN I: 0.06 ng/mL — AB (ref <0.03)
TROPONIN I: 0.17 ng/mL — AB (ref <0.03)
Troponin I: <0.03 ng/mL (ref <0.03)
Troponin I: 0.12 ng/mL (ref <0.03)

## 2016-11-03 LAB — PROTIME-INR
INR: 1.08
Prothrombin Time: 14 seconds (ref 11.4–15.2)

## 2016-11-03 MED ORDER — NITROGLYCERIN 0.4 MG SL SUBL: 0.4000 mg | SUBLINGUAL_TABLET | SUBLINGUAL | Status: DC | PRN

## 2016-11-03 MED ORDER — DIVALPROEX SODIUM ER 500 MG PO TB24
500.0000 mg | ORAL_TABLET | Freq: Every day | ORAL | Status: DC
  Administered 2016-11-03 – 2016-11-04 (×2): 500 mg via ORAL
  Filled 2016-11-03 (×2): qty 1

## 2016-11-03 MED ORDER — ROPINIROLE HCL 1 MG PO TABS
2.0000 mg | ORAL_TABLET | Freq: Two times a day (BID) | ORAL | Status: DC
  Administered 2016-11-04 (×2): 2 mg via ORAL
  Filled 2016-11-03 (×2): qty 2

## 2016-11-03 MED ORDER — LEVOTHYROXINE SODIUM 50 MCG PO TABS: 50.0000 ug | ORAL_TABLET | Freq: Every day | ORAL | Status: DC

## 2016-11-03 MED ORDER — FENOFIBRATE 160 MG PO TABS
160.0000 mg | ORAL_TABLET | Freq: Every day | ORAL | Status: DC
  Administered 2016-11-04: 160 mg via ORAL
  Filled 2016-11-03: qty 1

## 2016-11-03 MED ORDER — ROSUVASTATIN CALCIUM 10 MG PO TABS
20.0000 mg | ORAL_TABLET | Freq: Every day | ORAL | Status: DC
  Administered 2016-11-04: 20 mg via ORAL
  Filled 2016-11-03: qty 2

## 2016-11-03 MED ORDER — DULOXETINE HCL 30 MG PO CPEP
60.0000 mg | ORAL_CAPSULE | Freq: Every day | ORAL | Status: DC
  Administered 2016-11-03 – 2016-11-04 (×2): 60 mg via ORAL
  Filled 2016-11-03 (×2): qty 2

## 2016-11-03 MED ORDER — ACETAMINOPHEN 325 MG PO TABS: 650.0000 mg | ORAL_TABLET | ORAL | Status: DC | PRN

## 2016-11-03 MED ORDER — PREGABALIN 50 MG PO CAPS
100.0000 mg | ORAL_CAPSULE | Freq: Three times a day (TID) | ORAL | Status: DC
  Administered 2016-11-03 – 2016-11-04 (×2): 100 mg via ORAL
  Filled 2016-11-03 (×2): qty 2

## 2016-11-03 MED ORDER — FUROSEMIDE 40 MG PO TABS
40.0000 mg | ORAL_TABLET | Freq: Every day | ORAL | Status: DC
  Administered 2016-11-03 – 2016-11-04 (×2): 40 mg via ORAL
  Filled 2016-11-03 (×2): qty 1

## 2016-11-03 MED ORDER — HEPARIN BOLUS VIA INFUSION
2000.0000 [IU] | Freq: Once | INTRAVENOUS | Status: AC
  Administered 2016-11-03: 2000 [IU] via INTRAVENOUS
  Filled 2016-11-03: qty 2000

## 2016-11-03 MED ORDER — INSULIN ASPART 100 UNIT/ML ~~LOC~~ SOLN: 0.0000 [IU] | Freq: Every day | SUBCUTANEOUS | Status: DC

## 2016-11-03 MED ORDER — GI COCKTAIL ~~LOC~~
30.0000 mL | Freq: Four times a day (QID) | ORAL | Status: DC | PRN
  Filled 2016-11-03: qty 30

## 2016-11-03 MED ORDER — ADENOSINE 12 MG/4ML IV SOLN: 12.0000 mg | Freq: Once | INTRAVENOUS | Status: DC

## 2016-11-03 MED ORDER — DIVALPROEX SODIUM ER 500 MG PO TB24
1000.0000 mg | ORAL_TABLET | Freq: Every day | ORAL | Status: DC
  Administered 2016-11-04: 1000 mg via ORAL
  Filled 2016-11-03 (×2): qty 2

## 2016-11-03 MED ORDER — HEPARIN (PORCINE) IN NACL 100-0.45 UNIT/ML-% IJ SOLN
1450.0000 [IU]/h | INTRAMUSCULAR | Status: DC
  Administered 2016-11-03 – 2016-11-04 (×2): 1450 [IU]/h via INTRAVENOUS
  Filled 2016-11-03 (×2): qty 250

## 2016-11-03 MED ORDER — PANTOPRAZOLE SODIUM 40 MG PO PACK
40.0000 mg | PACK | Freq: Every day | ORAL | Status: DC
  Filled 2016-11-03 (×2): qty 20

## 2016-11-03 MED ORDER — METOPROLOL SUCCINATE ER 50 MG PO TB24
50.0000 mg | ORAL_TABLET | Freq: Every day | ORAL | Status: DC
  Administered 2016-11-03 – 2016-11-04 (×2): 50 mg via ORAL
  Filled 2016-11-03 (×2): qty 1

## 2016-11-03 MED ORDER — MORPHINE SULFATE (PF) 2 MG/ML IV SOLN: 2.0000 mg | INTRAVENOUS | Status: DC | PRN

## 2016-11-03 MED ORDER — ALPRAZOLAM 0.25 MG PO TABS: 0.2500 mg | ORAL_TABLET | Freq: Two times a day (BID) | ORAL | Status: DC | PRN

## 2016-11-03 MED ORDER — ALLOPURINOL 100 MG PO TABS
100.0000 mg | ORAL_TABLET | Freq: Every day | ORAL | Status: DC
  Administered 2016-11-03 – 2016-11-04 (×2): 100 mg via ORAL
  Filled 2016-11-03 (×2): qty 1

## 2016-11-03 MED ORDER — ISOSORBIDE MONONITRATE ER 60 MG PO TB24
60.0000 mg | ORAL_TABLET | Freq: Every day | ORAL | Status: DC
  Administered 2016-11-03 – 2016-11-04 (×2): 60 mg via ORAL
  Filled 2016-11-03 (×2): qty 1

## 2016-11-03 MED ORDER — DIVALPROEX SODIUM ER 500 MG PO TB24: 500.0000 mg | ORAL_TABLET | Freq: Two times a day (BID) | ORAL | Status: DC

## 2016-11-03 MED ORDER — INSULIN ASPART 100 UNIT/ML ~~LOC~~ SOLN
5.0000 [IU] | Freq: Three times a day (TID) | SUBCUTANEOUS | Status: DC
  Administered 2016-11-03 – 2016-11-04 (×2): 5 [IU] via SUBCUTANEOUS
  Filled 2016-11-03 (×2): qty 1

## 2016-11-03 MED ORDER — INSULIN GLARGINE 100 UNIT/ML ~~LOC~~ SOLN
20.0000 [IU] | Freq: Every day | SUBCUTANEOUS | Status: DC
  Administered 2016-11-03 – 2016-11-04 (×2): 20 [IU] via SUBCUTANEOUS
  Filled 2016-11-03 (×3): qty 0.2

## 2016-11-03 MED ORDER — INSULIN ASPART 100 UNIT/ML ~~LOC~~ SOLN
0.0000 [IU] | Freq: Three times a day (TID) | SUBCUTANEOUS | Status: DC
  Administered 2016-11-03 – 2016-11-04 (×2): 3 [IU] via SUBCUTANEOUS
  Filled 2016-11-03 (×2): qty 1

## 2016-11-03 MED ORDER — COLCHICINE 0.6 MG PO TABS: 0.6000 mg | ORAL_TABLET | Freq: Two times a day (BID) | ORAL | Status: DC | PRN

## 2016-11-03 MED ORDER — ZOLPIDEM TARTRATE 5 MG PO TABS: 5.0000 mg | ORAL_TABLET | Freq: Every evening | ORAL | Status: DC | PRN

## 2016-11-03 MED ORDER — AMLODIPINE BESYLATE 5 MG PO TABS
5.0000 mg | ORAL_TABLET | Freq: Every day | ORAL | Status: DC
  Administered 2016-11-04: 5 mg via ORAL
  Filled 2016-11-03: qty 1

## 2016-11-03 MED ORDER — ONDANSETRON HCL 4 MG/2ML IJ SOLN: 4.0000 mg | Freq: Four times a day (QID) | INTRAMUSCULAR | Status: DC | PRN

## 2016-11-03 NOTE — ED Notes (Signed)
MD Kinner at bedside  

## 2016-11-03 NOTE — H&P (Signed)
[facility] NAME: Dennis Mullen    MR#:  [id number]  DATE OF BIRTH:  December 09, 1950  DATE OF ADMISSION:  11/03/2016  PRIMARY CARE PHYSICIAN: Lavera Guise, MD   REQUESTING/REFERRING PHYSICIAN: Lavonia Drafts, MD  CHIEF COMPLAINT:   Chief Complaint  Patient presents with  . Chest Pain   Chest pain today. HISTORY OF PRESENT ILLNESS:  Dennis Mullen  is a 66 y.o. male with a known history of CAD with 6 stents, hypertension, diabetes, hypothyroidism, PVD and GERD. The patient to present to the ED with chest pain at 2 this morning. The chest pain is in substernal area, pressure-like without radiation. He took multiple nitroglycerin throughout the night and finally chest pain resolved at about 6 AM. He denies any nausea, diaphoresis or dyspnea. He has chronic leg swelling, following vascular surgeon due to PVD. His first troponin is 0.03 but up to 0.06.  PAST MEDICAL HISTORY:   Past Medical History:  Diagnosis Date  . Abnormal levels of other serum enzymes   . Anxiety   . Bipolar disorder ([place])   . BPH (benign prostatic hyperplasia)   . CAD (coronary artery disease)   . CHF (congestive heart failure) ([place])    ?  Marland Kitchen Depression   . Diabetes mellitus, type II ([place])   . Diabetes mellitus, type II, insulin dependent ([place])   . ED (erectile dysfunction)   . GERD (gastroesophageal reflux disease)   . Gout   . History of borderline personality disorder   . Hypertension   . Hypogonadism in male   . Hypothyroidism   . Insomnia   . Mood disorder ([place])   . Neuropathy   . Obesity   . OSA (obstructive sleep apnea)   . PVD (peripheral vascular disease) ([place])   . Thyroid disease     PAST SURGICAL HISTORY:   Past Surgical History:  Procedure Laterality Date  . AMPUTATION TOE Left 08/25/2016   Procedure: AMPUTATION TOE/ipj left 2nd toe;  Surgeon: Sharlotte Alamo, DPM;  Location: [facility] ORS;  Service: Podiatry;  Laterality: Left;  . CARDIAC  CATHETERIZATION N/A 10/12/2015   Procedure: Left Heart Cath and Coronary Angiography;  Surgeon: Teodoro Spray, MD;  Location: [place];  Service: Cardiovascular;  Laterality: N/A;  . CORONARY ANGIOPLASTY WITH STENT PLACEMENT     x 6 stents  . FOOT SURGERY Right    fracture repair with screw  . LEFT HEART CATH AND CORONARY ANGIOGRAPHY Right 06/01/2016   Procedure: Left Heart Cath and Coronary Angiography;  Surgeon: Isaias Cowman, MD;  Location: [place] [facility];  Service: Cardiovascular;  Laterality: Right;  . MASTOIDECTOMY Right   . NASAL SEPTUM SURGERY    . penial inplant    . PENILE PROSTHESIS IMPLANT    . right mastoidectomy    . SCROTOPLASTY    . TONSILLECTOMY AND ADENOIDECTOMY      SOCIAL HISTORY:   Social History  Substance Use Topics  . Smoking status: Never Smoker  . Smokeless tobacco: Never Used  . Alcohol use No    FAMILY HISTORY:   Family History  Problem Relation Age of Onset  . Lung cancer Mother   . Heart attack Father   . Post-traumatic stress disorder Father   . Anxiety disorder Father   . Depression Father   . Heart attack Sister   . Heart attack Brother   . Prostate cancer Neg Hx   . Kidney cancer Neg  Hx     DRUG ALLERGIES:  No Known Allergies  REVIEW OF SYSTEMS:   Review of Systems  Constitutional: Negative for chills, fever and malaise/fatigue.  HENT: Negative for sore throat.   Eyes: Negative for blurred vision and double vision.  Respiratory: Negative for cough, hemoptysis, shortness of breath, wheezing and stridor.   Cardiovascular: Positive for chest pain. Negative for palpitations, orthopnea and leg swelling.  Gastrointestinal: Negative for abdominal pain, blood in stool, diarrhea, melena, nausea and vomiting.  Genitourinary: Negative for dysuria, flank pain and hematuria.  Musculoskeletal: Negative for back pain and joint pain.  Neurological: Negative for dizziness, sensory change, focal weakness, seizures, loss of  consciousness, weakness and headaches.  Endo/Heme/Allergies: Negative for polydipsia.  Psychiatric/Behavioral: Negative for depression. The patient is not nervous/anxious.     MEDICATIONS AT HOME:   Prior to Admission medications   Medication Sig Start Date End Date Taking? Authorizing Provider  allopurinol (ZYLOPRIM) 100 MG tablet Take 100 mg by mouth daily.    Yes [provider]  amLODipine (NORVASC) 5 MG tablet Take 5 mg by mouth at bedtime.  10/14/14  Yes [provider]  apixaban (ELIQUIS) 5 MG TABS tablet Take 1 tablet (5 mg total) by mouth 2 (two) times daily. 06/02/16  Yes Sudini, Alveta Heimlich, MD  clopidogrel (PLAVIX) 75 MG tablet Take 75 mg by mouth daily. Reported on 07/26/2015   Yes [provider]  colchicine 0.6 MG tablet Take 0.6 mg by mouth 2 (two) times daily as needed (gout flare).  11/08/15  Yes [provider]  diclofenac (VOLTAREN) 75 MG EC tablet Take 75 mg by mouth daily as needed (gout flare).  12/09/15  Yes [provider]  divalproex (DEPAKOTE ER) 500 MG 24 hr tablet Take 1-2 tablets (500-1,000 mg total) by mouth 2 (two) times daily. Take 569m in morning and 10043min evening 08/24/16  Yes Clapacs, JoMadie RenoMD  DULoxetine (CYMBALTA) 60 MG capsule Take 1 capsule (60 mg total) by mouth daily. 08/24/16  Yes Clapacs, JoMadie RenoMD  esomeprazole (NEXIUM) 40 MG capsule Take 40 mg by mouth daily.  10/14/14  Yes [provider]  fenofibrate (TRICOR) 145 MG tablet Take 145 mg by mouth daily.  12/23/15 12/22/16 Yes [provider]  furosemide (LASIX) 40 MG tablet Take 1 tablet (40 mg total) by mouth daily. 09/25/15  Yes Wieting, Richard, MD  gemfibrozil (LOPID) 600 MG tablet gemfibrozil 600 mg tablet   Yes [provider]  glucose 4 GM chewable tablet Chew 4 g by mouth as needed for low blood sugar.    Yes [provider]  Insulin Disposable Pump (V-GO 30) KIT Inject 1.2 Units/hr into the skin every hour. 66 units/24  hrs (36 available bolus units--12 bolus units available per meals) 10/07/14  Yes [provider]  insulin lispro (HUMALOG KWIKPEN) 100 UNIT/ML KiwkPen Inject into the skin See admin instructions. Used ONLY as needed if additional bolus units are needed (up to 36 units/24 hrs.)   Yes [provider]  isosorbide mononitrate (IMDUR) 60 MG 24 hr tablet Take 60 mg by mouth daily.  05/09/16 05/09/17 Yes [provider]  JUBLIA 10 % SOLN Apply 1 application topically daily. 10/31/16  Yes [provider]  levothyroxine (SYNTHROID, LEVOTHROID) 50 MCG tablet Take 50 mcg by mouth daily before breakfast.   Yes [provider]  metFORMIN (GLUCOPHAGE) 1000 MG tablet Take 1,000 mg by mouth 2 (two) times daily with a meal. Morning & supper  Yes [provider]  metoprolol succinate (TOPROL-XL) 50 MG 24 hr tablet Take 50 mg by mouth daily.  09/11/14  Yes [provider]  nitroGLYCERIN (NITROSTAT) 0.4 MG SL tablet Place 0.4 mg under the tongue every 5 (five) minutes x 3 doses as needed for chest pain.    Yes [provider]  pregabalin (LYRICA) 100 MG capsule Take 100 mg by mouth 3 (three) times daily before meals.    Yes [provider]  rOPINIRole (REQUIP) 2 MG tablet Take 2 mg by mouth 2 (two) times daily.  04/27/15  Yes [provider]  rosuvastatin (CRESTOR) 20 MG tablet Take 20 mg by mouth at bedtime.    Yes [provider]  VICTOZA 18 MG/3ML SOPN Inject 1.8 mg into the skin daily.  09/07/14  Yes [provider]  vitamin C (ASCORBIC ACID) 500 MG tablet Take 500 mg by mouth daily.   Yes [provider]  Continuous Blood Gluc Sensor (FREESTYLE LIBRE SENSOR SYSTEM) MISC Use 1 each as directed. Please place prior to office visit as directed 06/20/16   [provider]  HYDROcodone-acetaminophen (NORCO/VICODIN) 5-325 MG tablet Take 1 tablet by mouth every 4 (four) hours as needed for moderate  pain. Patient not taking: Reported on 11/03/2016 08/25/16 08/25/17  Sharlotte Alamo, DPM      VITAL SIGNS:  Blood pressure (!) 154/66, pulse (!) 57, temperature (!) 97.5 F (36.4 C), temperature source Oral, resp. rate 17, height 6' 2"  (1.88 m), weight 288 lb 5.8 oz (130.8 kg), SpO2 100 %.  PHYSICAL EXAMINATION:  Physical Exam  GENERAL:  66 y.o.-year-old patient lying in the bed with no acute distress. Morbidly obese. EYES: Pupils equal, round, reactive to light and accommodation. No scleral icterus. Extraocular muscles intact.  HEENT: Head atraumatic, normocephalic. Oropharynx and nasopharynx clear.  NECK:  Supple, no jugular venous distention. No thyroid enlargement, no tenderness.  LUNGS: Normal breath sounds bilaterally, no wheezing, rales,rhonchi or crepitation. No use of accessory muscles of respiration.  CARDIOVASCULAR: S1, S2 normal. No murmurs, rubs, or gallops.  ABDOMEN: Soft, nontender, nondistended. Bowel sounds present. No organomegaly or mass.  EXTREMITIES: Bilateral leg edema 1+, no cyanosis, or clubbing.  NEUROLOGIC: Cranial nerves II through XII are intact. Muscle strength 5/5 in all extremities. Sensation intact. Gait not checked.  PSYCHIATRIC: The patient is alert and oriented x 3.  SKIN: No obvious rash, lesion, or ulcer.   LABORATORY PANEL:   CBC  Recent Labs Lab 11/03/16 0835  WBC 5.5  HGB 10.4*  HCT 32.0*  PLT 232   ------------------------------------------------------------------------------------------------------------------  Chemistries   Recent Labs Lab 11/03/16 0835  NA 140  K 4.9  CL 108  CO2 26  GLUCOSE 198*  BUN 22*  CREATININE 1.02  CALCIUM 8.7*  AST 37  ALT 23  ALKPHOS 84  BILITOT 0.6   ------------------------------------------------------------------------------------------------------------------  Cardiac Enzymes  Recent Labs Lab 11/03/16 1627  TROPONINI 0.12*    ------------------------------------------------------------------------------------------------------------------  RADIOLOGY:  Dg Chest Portable 1 View  Result Date: 11/03/2016 CLINICAL DATA:  66 year old male with central chest pain since last night. Coronary artery disease, cardiac stent. EXAM: PORTABLE CHEST 1 VIEW COMPARISON:  05/31/2016 and earlier. FINDINGS: Portable AP upright view at 0852 hours. Stable lung volumes. Mediastinal contours are stable and within normal limits. Visualized tracheal air column is within normal limits. Allowing for portable technique the lungs are clear. No pneumothorax or pleural effusion. IMPRESSION: No acute cardiopulmonary abnormality. Electronically Signed   By: Herminio Heads.D.  On: 11/03/2016 09:07      IMPRESSION AND PLAN:   Chest pain with elevated troponin. CAD with 6 stents. The patient is placed for observation. Follow-up troponin level. Hold Eliquis, started heparin drip, continue Lopressor and statin, follow-up cardiology consult.  Hypertension. Continue home hypertension medication. Diabetes. Start sliding scale and Lantus. Morbid obesity and OSA.  All the records are reviewed and case discussed with ED provider. Management plans discussed with the patient, family and they are in agreement.  CODE STATUS: Full code  TOTAL TIME TAKING CARE OF THIS PATIENT: 53 minutes.    Demetrios Loll M.D on 11/03/2016 at 6:34 PM  Between 7am to 6pm - Pager - [phone]  After 6pm go to [email or web address] - password [email or web address] [facility]  [facility]  Office  [phone]  CC: Primary care physician; Lavera Guise, MD   Note: This dictation was prepared with Dragon dictation along with smaller phrase technology. Any transcriptional errors that result from this process are unintentional.

## 2016-11-03 NOTE — ED Triage Notes (Signed)
Pt to ED by EMS with c/o of chest pain that started around 2am. Pt denies pain at this time but states that at the time of onset it was central chest pain that he describes as a feeling of pressure. Pt told EMS that he took 6 nitro prior to calling 911.

## 2016-11-03 NOTE — Progress Notes (Signed)
ANTICOAGULATION CONSULT NOTE - Initial Consult  Pharmacy Consult for heparin drip Indication: chest pain/ACS  No Known Allergies  Patient Measurements: Height: 6\' 2"  (188 cm) Weight: 288 lb 5.8 oz (130.8 kg) IBW/kg (Calculated) : 82.2 Heparin Dosing Weight: 111 kg  Vital Signs: Temp: 97.5 F (36.4 C) (07/20 1751) Temp Source: Oral (07/20 1751) BP: 154/66 (07/20 1751) Pulse Rate: 57 (07/20 1751)  Labs:  Recent Labs  11/03/16 0835 11/03/16 1119 11/03/16 1459 11/03/16 1627  HGB 10.4*  --   --   --   HCT 32.0*  --   --   --   PLT 232  --   --   --   APTT  --   --  34  --   LABPROT  --   --  14.0  --   INR  --   --  1.08  --   HEPARINUNFRC  --   --  <0.10*  --   CREATININE 1.02  --   --   --   TROPONINI <0.03 0.06*  --  0.12*    Estimated Creatinine Clearance: 106.5 mL/min (by C-G formula based on SCr of 1.02 mg/dL).  Assessment: Pharmacy consulted to dose and monitor heparin drip for ACS. Patient was taking apixaban prior to admission with last reported dose on 11/02/16 @ 1900. Baseline labs were obtained.   Goal of Therapy:  Heparin level 0.3-0.7 units/ml Monitor platelets by anticoagulation protocol: Yes   Plan:  Heparin bolus of 2000 units was given since patient was on apixaban Heparin infusion was started at 1450 units/hr Will check HL and APTT 6 hours after infusion begins Continue to monitor H&H and platelets, CBC daily  Lenis Noon, PharmD Clinical Pharmacist 11/03/2016,7:51 PM

## 2016-11-03 NOTE — ED Notes (Signed)
Pt upset about being admitted. Pt states he is "tired of waiting for the admitting doctor". Pt states he has "taken these (IV) out before". RN asked the pt to not do so. Pt has taken BP cuff and O2 off. RN put Pt back on the cardiac monitor.

## 2016-11-03 NOTE — Consult Note (Signed)
[facility] Cardiology  CARDIOLOGY CONSULT NOTE  Patient ID: KENTAVIOUS MICHELE MRN: [id number] DOB/AGE: 1950-08-01 66 y.o.  Admit date: 11/03/2016 Referring Physician Bridgett Larsson Primary Physician Clayborn Bigness, MD Primary Cardiologist [name] Reason for Consultation Elevated troponin, chest pain  HPI: 66 year old male referred for elevated troponin and chest pain. The patient has a history of coronary artery disease, status post multiple stents, with recent cardiac catheterization in 05/2016 revealing insignificant CAD, moderate to severe aortic stenosis, paroxysmal atrial fibrillation on Eliquis, hypertension, type 2 diabetes, and OSA. The patient reports experiencing mild chest pressure starting at 2 AM yesterday morning associated with mild shortness of breath, without radiation, nausea, or diaphoresis. The patient took a sublingual nitroglycerin, and the pain resolved within about 15 minutes. The patient continued to have waxing and waning episodes of the same chest pressure throughout the night, taking nitroglycerin for each episode, for a total of 6. The patient states the nature of his chest pain was similar to what he has experienced in the past, however, the episodes continued to recur.The patient called EMS, and was advised to go to the ER, though he did not want to go as his chest pain had resolved. Initial ECG was negative for acute ST or T wave abnormalities. Admission labs notable for troponin <0.03, 0.06, followed by 0.12. Creatinine and BUN were 1.02 and 22, respectively. 2D echocardiogram 05/31/16 revealed normal left ventricular function with LVEF 55-65%, moderate to severe aortic stenosis with valve area 0.77 cm2. Currently, the patient denies recurrence of chest pain or shortness of breath.   Review of systems complete and found to be negative unless listed above     Past Medical History:  Diagnosis Date  . Abnormal levels of other serum enzymes   . Anxiety   . Bipolar disorder ([place])   . BPH  (benign prostatic hyperplasia)   . CAD (coronary artery disease)   . CHF (congestive heart failure) ([place])    ?  Marland Kitchen Depression   . Diabetes mellitus, type II ([place])   . Diabetes mellitus, type II, insulin dependent ([place])   . ED (erectile dysfunction)   . GERD (gastroesophageal reflux disease)   . Gout   . History of borderline personality disorder   . Hypertension   . Hypogonadism in male   . Hypothyroidism   . Insomnia   . Mood disorder ([place])   . Neuropathy   . Obesity   . OSA (obstructive sleep apnea)   . PVD (peripheral vascular disease) ([place])   . Thyroid disease     Past Surgical History:  Procedure Laterality Date  . AMPUTATION TOE Left 08/25/2016   Procedure: AMPUTATION TOE/ipj left 2nd toe;  Surgeon: Sharlotte Alamo, DPM;  Location: [facility] ORS;  Service: Podiatry;  Laterality: Left;  . CARDIAC CATHETERIZATION N/A 10/12/2015   Procedure: Left Heart Cath and Coronary Angiography;  Surgeon: Teodoro Spray, MD;  Location: [place] [facility];  Service: Cardiovascular;  Laterality: N/A;  . CORONARY ANGIOPLASTY WITH STENT PLACEMENT     x 6 stents  . FOOT SURGERY Right    fracture repair with screw  . LEFT HEART CATH AND CORONARY ANGIOGRAPHY Right 06/01/2016   Procedure: Left Heart Cath and Coronary Angiography;  Surgeon: Isaias Cowman, MD;  Location: [place] [facility];  Service: Cardiovascular;  Laterality: Right;  . MASTOIDECTOMY Right   . NASAL SEPTUM SURGERY    . penial inplant    . PENILE PROSTHESIS IMPLANT    . right mastoidectomy    .  SCROTOPLASTY    . TONSILLECTOMY AND ADENOIDECTOMY      Prescriptions Prior to Admission  Medication Sig Dispense Refill Last Dose  . allopurinol (ZYLOPRIM) 100 MG tablet Take 100 mg by mouth daily.    11/02/2016 at 0800  . amLODipine (NORVASC) 5 MG tablet Take 5 mg by mouth at bedtime.    11/02/2016 at 2000  . apixaban (ELIQUIS) 5 MG TABS tablet Take 1 tablet (5 mg total) by mouth 2 (two) times daily. 60 tablet 0 11/02/2016 at 1900  .  clopidogrel (PLAVIX) 75 MG tablet Take 75 mg by mouth daily. Reported on 07/26/2015   11/02/2016 at 0800  . colchicine 0.6 MG tablet Take 0.6 mg by mouth 2 (two) times daily as needed (gout flare).    PRN at PRN  . diclofenac (VOLTAREN) 75 MG EC tablet Take 75 mg by mouth daily as needed (gout flare).    PRN at PRN  . divalproex (DEPAKOTE ER) 500 MG 24 hr tablet Take 1-2 tablets (500-1,000 mg total) by mouth 2 (two) times daily. Take 561m in morning and 10089min evening 270 tablet 1 11/02/2016 at 2000  . DULoxetine (CYMBALTA) 60 MG capsule Take 1 capsule (60 mg total) by mouth daily. 90 capsule 1 11/02/2016 at 0800  . esomeprazole (NEXIUM) 40 MG capsule Take 40 mg by mouth daily.    11/02/2016 at 0800  . fenofibrate (TRICOR) 145 MG tablet Take 145 mg by mouth daily.    11/02/2016 at 0800  . furosemide (LASIX) 40 MG tablet Take 1 tablet (40 mg total) by mouth daily. 30 tablet 0 11/02/2016 at 0800  . gemfibrozil (LOPID) 600 MG tablet gemfibrozil 600 mg tablet   11/02/2016 at 0800  . glucose 4 GM chewable tablet Chew 4 g by mouth as needed for low blood sugar.    PRN at PRN  . Insulin Disposable Pump (V-GO 30) KIT Inject 1.2 Units/hr into the skin every hour. 66 units/24 hrs (36 available bolus units--12 bolus units available per meals)   11/02/2016 at Unknown time  . insulin lispro (HUMALOG KWIKPEN) 100 UNIT/ML KiwkPen Inject into the skin See admin instructions. Used ONLY as needed if additional bolus units are needed (up to 36 units/24 hrs.)   PRN at PRN  . isosorbide mononitrate (IMDUR) 60 MG 24 hr tablet Take 60 mg by mouth daily.    11/02/2016 at 0800  . JUBLIA 10 % SOLN Apply 1 application topically daily.   11/02/2016 at 2000  . levothyroxine (SYNTHROID, LEVOTHROID) 50 MCG tablet Take 50 mcg by mouth daily before breakfast.   11/02/2016 at 0730  . metFORMIN (GLUCOPHAGE) 1000 MG tablet Take 1,000 mg by mouth 2 (two) times daily with a meal. Morning & supper   11/02/2016 at 2000  . metoprolol succinate  (TOPROL-XL) 50 MG 24 hr tablet Take 50 mg by mouth daily.    11/02/2016 at 0800  . nitroGLYCERIN (NITROSTAT) 0.4 MG SL tablet Place 0.4 mg under the tongue every 5 (five) minutes x 3 doses as needed for chest pain.    11/03/2016 at 0600  . pregabalin (LYRICA) 100 MG capsule Take 100 mg by mouth 3 (three) times daily before meals.    11/02/2016 at 2000  . rOPINIRole (REQUIP) 2 MG tablet Take 2 mg by mouth 2 (two) times daily.    11/02/2016 at 2000  . rosuvastatin (CRESTOR) 20 MG tablet Take 20 mg by mouth at bedtime.    11/02/2016 at 2000  . VICTOZA 18 MG/3ML  SOPN Inject 1.8 mg into the skin daily.    11/02/2016 at 0800  . vitamin C (ASCORBIC ACID) 500 MG tablet Take 500 mg by mouth daily.   11/02/2016 at 0800  . Continuous Blood Gluc Sensor (FREESTYLE LIBRE SENSOR SYSTEM) MISC Use 1 each as directed. Please place prior to office visit as directed   Taking  . HYDROcodone-acetaminophen (NORCO/VICODIN) 5-325 MG tablet Take 1 tablet by mouth every 4 (four) hours as needed for moderate pain. (Patient not taking: Reported on 11/03/2016) 20 tablet 0 Completed Course at Unknown time   Social History   Social History  . Marital status: Single    Spouse name: N/A  . Number of children: N/A  . Years of education: N/A   Occupational History  . retired    Social History Main Topics  . Smoking status: Never Smoker  . Smokeless tobacco: Never Used  . Alcohol use No  . Drug use: No  . Sexual activity: Not Currently    Birth control/ protection: None   Other Topics Concern  . Not on file   Social History Narrative   ** Merged History Encounter **        Family History  Problem Relation Age of Onset  . Lung cancer Mother   . Heart attack Father   . Post-traumatic stress disorder Father   . Anxiety disorder Father   . Depression Father   . Heart attack Sister   . Heart attack Brother   . Prostate cancer Neg Hx   . Kidney cancer Neg Hx       Review of systems complete and found to be negative  unless listed above      PHYSICAL EXAM  General: Well developed, well nourished, in no acute distress HEENT:  Normocephalic and atramatic Neck:  No JVD.  Lungs: Clear bilaterally to auscultation, normal effort of breathing.  Heart: HRRR . 2/6 systolic murmur  Abdomen: Bowel sounds are positive, abdomen soft and non-tender  Msk:  Back normal, patient sitting upright on side of bed. Extremities: No clubbing, cyanosis or edema.   Neuro: Alert and oriented X 3. Psych:  Good affect, responds appropriately  Labs:   Lab Results  Component Value Date   WBC 5.5 11/03/2016   HGB 10.4 (L) 11/03/2016   HCT 32.0 (L) 11/03/2016   MCV 85.0 11/03/2016   PLT 232 11/03/2016    Recent Labs Lab 11/03/16 0835  NA 140  K 4.9  CL 108  CO2 26  BUN 22*  CREATININE 1.02  CALCIUM 8.7*  PROT 7.0  BILITOT 0.6  ALKPHOS 84  ALT 23  AST 37  GLUCOSE 198*   Lab Results  Component Value Date   CKTOTAL 249 08/15/2013   CKMB 7.8 (H) 08/15/2013   TROPONINI 0.12 (HH) 11/03/2016    Lab Results  Component Value Date   CHOL 176 10/11/2015   CHOL 152 09/24/2015   CHOL 223 (H) 01/29/2014   Lab Results  Component Value Date   HDL 45 10/11/2015   HDL 48 09/24/2015   HDL 34 (L) 01/29/2014   Lab Results  Component Value Date   LDLCALC 89 10/11/2015   LDLCALC 46 09/24/2015   LDLCALC SEE COMMENT 01/29/2014   Lab Results  Component Value Date   TRIG 211 (H) 10/11/2015   TRIG 291 (H) 09/24/2015   TRIG 474 (H) 01/29/2014   Lab Results  Component Value Date   CHOLHDL 3.9 10/11/2015   CHOLHDL 3.2 09/24/2015  No results found for: LDLDIRECT    Radiology: Dg Chest Portable 1 View  Result Date: 11/03/2016 CLINICAL DATA:  66 year old male with central chest pain since last night. Coronary artery disease, cardiac stent. EXAM: PORTABLE CHEST 1 VIEW COMPARISON:  05/31/2016 and earlier. FINDINGS: Portable AP upright view at 0852 hours. Stable lung volumes. Mediastinal contours are stable and  within normal limits. Visualized tracheal air column is within normal limits. Allowing for portable technique the lungs are clear. No pneumothorax or pleural effusion. IMPRESSION: No acute cardiopulmonary abnormality. Electronically Signed   By: Genevie Ann M.D.   On: 11/03/2016 09:07    EKG: Sinus rhythm, rate 60 bpm  ASSESSMENT AND PLAN:  1. Coronary artery disease, status post multiple stents, with multiple recurrent episodes of mild nonexertional chest pressure, waxing and waning, relieved with nitroglycerin, for which he took a total of 6, which occurred last night, now resolved, with borderline elevated troponin of <0.03, 0.06, followed by 0.12, with negative ECG. 2. Atrial fibrillation, rate controlled on metoprolol, and anticoagulated with Eliquis 3. Moderate to severe aortic stenosis with valve area 0.77 cm2  Recommendations: 1. Continue current therapy.  2. Continue heparin drip 3. Hold Eliquis for now 4. Lexiscan Myoview in the morning 5. Continue to trend cardiac enzymes 6. Further recommendations pending patient's initial course.  Signed: Clabe Seal, PA-C 11/03/2016, 5:11 PM

## 2016-11-03 NOTE — ED Notes (Signed)
This RN received heparin from pharmacy while giving report to floor. Heparin sent unspiked with ED Tech for pt transport.

## 2016-11-03 NOTE — Plan of Care (Signed)
Problem: Pain Managment: Goal: General experience of comfort will improve Outcome: Progressing Assess patient pain and encourage patient to notify RN if any chest pain occurs.

## 2016-11-03 NOTE — Progress Notes (Signed)
Inpatient Diabetes Program Recommendations  AACE/ADA: New Consensus Statement on Inpatient Glycemic Control (2015)  Target Ranges:  Prepandial:   less than 140 mg/dL      Peak postprandial:   less than 180 mg/dL (1-2 hours)      Critically ill patients:  140 - 180 mg/dL   Lab Results  Component Value Date   GLUCAP 190 (H) 08/25/2016   HGBA1C 7.5 (H) 05/31/2016    Review of Glycemic Control- lab glucose  Results for PROMISE, BUSHONG (MRN [id number]) as of 11/03/2016 16:03  Ref. Range 11/03/2016 08:35  Glucose Latest Ref Range: 65 - 99 mg/dL 198 (H)   Diabetes history:DM2 Outpatient Diabetes medications: V-Go 30 insulin pump (provides 30 units of basal insulin; and uses additional insulin for meals per pump) withHumalog insulin, Metformin 1000 mg BID, Victoza 1.8 mg daily Current orders for Inpatient glycemic control: Novolog 0-9 units tid, Novolog 0-5 units qhs  Inpatient Diabetes Program Recommendations: Please consider Lantus  20 units Q24H starting now. (patient uses basal 30 units at home in his pump) Please consider ordering Novolog 5 units tid with meals (hold if he eats less than 50%)- continue Novolog correction as ordered.   Spoke to patient by phone - he tells me he generally takes 6 pushes of his pump for meals, depending on the size of the meal.  This is equivalent to 12 units of Humalog insulin per meal since each push of the button delivers 2 units.  Gentry Fitz, RN, BA, MHA, CDE Diabetes Coordinator Inpatient Diabetes Program  [phone] (Team Pager) [phone] ([place]) 11/03/2016 4:03 PM

## 2016-11-03 NOTE — ED Provider Notes (Signed)
[facility] Emergency Department Provider Note   ____________________________________________    I have reviewed the triage vital signs and the nursing notes.   HISTORY  Chief Complaint Chest Pain     HPI Dennis Mullen is a 66 y.o. male with history of coronary artery disease, diabetes and medical history as listed below who presents with chest pain that started at approximately 2 AM. He reports he took multiple nitroglycerin throughout the night and finally his pain resolved at approximately 6 AM. He notes he had mild to moderate pressure-like discomfort similar to his usual angina. The pain was not constant but seemed to come on whenever he stood up to go to the bathroom or exerted himself at all. He states he took 6 nitroglycerin in total. When EMS came and they recommended he come to be evaluated even though his chest pain had resolved. He denies shortness of breath. Dr. [name] is his cardiologist. History of 6 cardiac stents in the past.   Past Medical History:  Diagnosis Date  . Abnormal levels of other serum enzymes   . Anxiety   . Bipolar disorder ([place])   . BPH (benign prostatic hyperplasia)   . CAD (coronary artery disease)   . CHF (congestive heart failure) ([place])    ?  Marland Kitchen Depression   . Diabetes mellitus, type II ([place])   . Diabetes mellitus, type II, insulin dependent ([place])   . ED (erectile dysfunction)   . GERD (gastroesophageal reflux disease)   . Gout   . History of borderline personality disorder   . Hypertension   . Hypogonadism in male   . Hypothyroidism   . Insomnia   . Mood disorder ([place])   . Neuropathy   . Obesity   . OSA (obstructive sleep apnea)   . PVD (peripheral vascular disease) ([place])   . Thyroid disease     Patient Active Problem List   Diagnosis Date Noted  . Lymphedema 03/30/2016  . Left leg cellulitis 03/12/2016  . Insulin overdose 02/02/2016  . Hypoglycemia   . OSA on CPAP   . Depression   .  Hypophosphatemia   . SOB (shortness of breath) 10/11/2015  . Elevated troponin 10/11/2015  . Chest pain at rest 09/24/2015  . Elevated PSA 07/26/2015  . Hypogonadism in male 07/26/2015  . 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 BPH with obstruction/lower urinary tract symptoms 06/29/2015  . Bipolar 1 disorder, mixed ([place]) 04/14/2015  . Borderline personality disorder 04/14/2015  . Acute renal failure ([place]) 11/15/2014  . Chest pain 11/14/2014  . Dizziness 11/05/2014  . Benign essential HTN 11/04/2014  . Combined fat and carbohydrate induced hyperlipemia 11/04/2014  . Bipolar 2 disorder ([place]) 10/27/2014  . Acquired hypothyroidism 02/03/2014  . Type 2 diabetes mellitus ([place]) 02/03/2014  . Adiposity 01/06/2014  . Obesity, diabetes, and hypertension syndrome ([place]) 12/23/2013  . Long term current use of insulin ([place]) 12/23/2013  . Type 2 diabetes mellitus with other diabetic neurological complication ([place]) 85/92/9244  . Disorder affecting the body's metabolism 12/23/2013  . Acute inflammation of the pancreas 11/18/2013  . Elevated cholesterol with elevated triglycerides 11/18/2013  . CAD (coronary artery disease), native coronary artery 09/26/2013  . Diabetes mellitus ([place]) 09/26/2013  . Acute non-ST elevation myocardial infarction (NSTEMI) ([place]) 09/26/2013  . Non-ST elevation (NSTEMI) myocardial infarction [facility]) 09/26/2013    Past Surgical History:  Procedure Laterality Date  . AMPUTATION TOE Left 08/25/2016   Procedure: AMPUTATION TOE/ipj left 2nd toe;  Surgeon: Sharlotte Alamo, DPM;  Location: [facility] ORS;  Service: Podiatry;  Laterality: Left;  . CARDIAC CATHETERIZATION N/A 10/12/2015   Procedure: Left Heart Cath and Coronary Angiography;  Surgeon: Teodoro Spray, MD;  Location: [place] [facility];  Service: Cardiovascular;  Laterality: N/A;  . CORONARY ANGIOPLASTY WITH STENT PLACEMENT     x 6 stents  . FOOT SURGERY Right    fracture repair with screw  . LEFT HEART CATH  AND CORONARY ANGIOGRAPHY Right 06/01/2016   Procedure: Left Heart Cath and Coronary Angiography;  Surgeon: Isaias Cowman, MD;  Location: [place] [facility];  Service: Cardiovascular;  Laterality: Right;  . MASTOIDECTOMY Right   . NASAL SEPTUM SURGERY    . penial inplant    . PENILE PROSTHESIS IMPLANT    . right mastoidectomy    . SCROTOPLASTY    . TONSILLECTOMY AND ADENOIDECTOMY      Prior to Admission medications   Medication Sig Start Date End Date Taking? Authorizing Provider  allopurinol (ZYLOPRIM) 100 MG tablet Take 100 mg by mouth daily.    Yes [provider]  amLODipine (NORVASC) 5 MG tablet Take 5 mg by mouth at bedtime.  10/14/14  Yes [provider]  apixaban (ELIQUIS) 5 MG TABS tablet Take 1 tablet (5 mg total) by mouth 2 (two) times daily. 06/02/16  Yes Sudini, Alveta Heimlich, MD  clopidogrel (PLAVIX) 75 MG tablet Take 75 mg by mouth daily. Reported on 07/26/2015   Yes [provider]  colchicine 0.6 MG tablet Take 0.6 mg by mouth 2 (two) times daily as needed (gout flare).  11/08/15  Yes [provider]  diclofenac (VOLTAREN) 75 MG EC tablet Take 75 mg by mouth daily as needed (gout flare).  12/09/15  Yes [provider]  divalproex (DEPAKOTE ER) 500 MG 24 hr tablet Take 1-2 tablets (500-1,000 mg total) by mouth 2 (two) times daily. Take 548m in morning and 10035min evening 08/24/16  Yes Clapacs, JoMadie RenoMD  DULoxetine (CYMBALTA) 60 MG capsule Take 1 capsule (60 mg total) by mouth daily. 08/24/16  Yes Clapacs, JoMadie RenoMD  esomeprazole (NEXIUM) 40 MG capsule Take 40 mg by mouth daily.  10/14/14  Yes [provider]  fenofibrate (TRICOR) 145 MG tablet Take 145 mg by mouth daily.  12/23/15 12/22/16 Yes [provider]  furosemide (LASIX) 40 MG tablet Take 1 tablet (40 mg total) by mouth daily. 09/25/15  Yes Wieting, Richard, MD  gemfibrozil (LOPID) 600 MG tablet gemfibrozil 600 mg tablet   Yes [provider]  glucose 4  GM chewable tablet Chew 4 g by mouth as needed for low blood sugar.    Yes [provider]  Insulin Disposable Pump (V-GO 30) KIT Inject 1.2 Units/hr into the skin every hour. 66 units/24 hrs (36 available bolus units--12 bolus units available per meals) 10/07/14  Yes [provider]  insulin lispro (HUMALOG KWIKPEN) 100 UNIT/ML KiwkPen Inject into the skin See admin instructions. Used ONLY as needed if additional bolus units are needed (up to 36 units/24 hrs.)   Yes [provider]  isosorbide mononitrate (IMDUR) 60 MG 24 hr tablet Take 60 mg by mouth daily.  05/09/16 05/09/17 Yes [provider]  JUBLIA 10 % SOLN Apply 1 application topically daily. 10/31/16  Yes [provider]  levothyroxine (SYNTHROID, LEVOTHROID) 50 MCG tablet Take 50 mcg by mouth daily before breakfast.   Yes [provider]  metFORMIN (GLUCOPHAGE) 1000 MG tablet Take 1,000 mg  by mouth 2 (two) times daily with a meal. Morning & supper   Yes [provider]  metoprolol succinate (TOPROL-XL) 50 MG 24 hr tablet Take 50 mg by mouth daily.  09/11/14  Yes [provider]  nitroGLYCERIN (NITROSTAT) 0.4 MG SL tablet Place 0.4 mg under the tongue every 5 (five) minutes x 3 doses as needed for chest pain.    Yes [provider]  pregabalin (LYRICA) 100 MG capsule Take 100 mg by mouth 3 (three) times daily before meals.    Yes [provider]  rOPINIRole (REQUIP) 2 MG tablet Take 2 mg by mouth 2 (two) times daily.  04/27/15  Yes [provider]  rosuvastatin (CRESTOR) 20 MG tablet Take 20 mg by mouth at bedtime.    Yes [provider]  VICTOZA 18 MG/3ML SOPN Inject 1.8 mg into the skin daily.  09/07/14  Yes [provider]  vitamin C (ASCORBIC ACID) 500 MG tablet Take 500 mg by mouth daily.   Yes [provider]  Continuous Blood Gluc Sensor (FREESTYLE LIBRE SENSOR SYSTEM) MISC Use 1 each as directed. Please place prior  to office visit as directed 06/20/16   [provider]  HYDROcodone-acetaminophen (NORCO/VICODIN) 5-325 MG tablet Take 1 tablet by mouth every 4 (four) hours as needed for moderate pain. Patient not taking: Reported on 11/03/2016 08/25/16 08/25/17  Sharlotte Alamo, DPM     Allergies Patient has no known allergies.  Family History  Problem Relation Age of Onset  . Lung cancer Mother   . Heart attack Father   . Post-traumatic stress disorder Father   . Anxiety disorder Father   . Depression Father   . Heart attack Sister   . Heart attack Brother   . Prostate cancer Neg Hx   . Kidney cancer Neg Hx     Social History Social History  Substance Use Topics  . Smoking status: Never Smoker  . Smokeless tobacco: Never Used  . Alcohol use No    Review of Systems  Constitutional: No fever/chills Eyes: No visual changes.  ENT: No Neck pain Cardiovascular: As above Respiratory: Denies shortness of breath. Gastrointestinal: No abdominal pain.  No nausea, no vomiting.   Genitourinary: Negative for dysuria. Musculoskeletal: Negative for back pain. Skin: Negative for rash. Neurological: Negative for headaches    ____________________________________________   PHYSICAL EXAM:  VITAL SIGNS: ED Triage Vitals  Enc Vitals Group     BP 11/03/16 0833 (!) 141/74     Pulse Rate 11/03/16 0833 63     Resp 11/03/16 0833 13     Temp 11/03/16 0840 98.3 F (36.8 C)     Temp src --      SpO2 11/03/16 0833 96 %     Weight 11/03/16 0828 130.8 kg (288 lb 5.8 oz)     Height 11/03/16 0828 1.88 m (_0 )     Head Circumference --      Peak Flow --      Pain Score --      Pain Loc --      Pain Edu? --      Excl. in [place]? --     Constitutional: Alert and oriented. No acute distress. Pleasant and interactive Eyes: Conjunctivae are normal.   Nose: No congestion/rhinnorhea. Mouth/Throat: Mucous membranes are moist.    Cardiovascular: Normal rate, regular rhythm. Systolic murmur 2/6.   Good  peripheral circulation. Respiratory: Normal respiratory effort.  No retractions. Lungs CTAB. Gastrointestinal: Soft and nontender. No  distention.  No CVA tenderness. Genitourinary: deferred Musculoskeletal: No lower extremity tenderness nor edema.  Warm and well perfused Neurologic:  Normal speech and language. No gross focal neurologic deficits are appreciated.  Skin:  Skin is warm, dry and intact. No rash noted. Psychiatric: Mood and affect are normal. Speech and behavior are normal.  ____________________________________________   LABS (all labs ordered are listed, but only abnormal results are displayed)  Labs Reviewed  CBC - Abnormal; Notable for the following:       Result Value   RBC 3.77 (*)    Hemoglobin 10.4 (*)    HCT 32.0 (*)    RDW 18.9 (*)    All other components within normal limits  COMPREHENSIVE METABOLIC PANEL - Abnormal; Notable for the following:    Glucose, Bld 198 (*)    BUN 22 (*)    Calcium 8.7 (*)    Albumin 3.3 (*)    All other components within normal limits  TROPONIN I - Abnormal; Notable for the following:    Troponin I 0.06 (*)    All other components within normal limits  TROPONIN I   ____________________________________________  EKG  ED ECG REPORT I, Lavonia Drafts, the attending physician, personally viewed and interpreted this ECG.  Date: 11/03/2016  Rhythm: normal sinus rhythm QRS Axis: normal Intervals: normal ST/T Wave abnormalities: normal Narrative Interpretation: unremarkable  ____________________________________________  RADIOLOGY  Chest x-ray unremarkable ____________________________________________   PROCEDURES  Procedure(s) performed: No    Critical Care performed:No ____________________________________________   INITIAL IMPRESSION / ASSESSMENT AND PLAN / ED COURSE  Pertinent labs & imaging results that were available during my care of the patient were reviewed by me and considered in my medical decision  making (see chart for details).  Patient well-appearing and in no acute distress upon arrival. He reports his chest pain has resolved. However his description of needing to use nitroglycerin glycerin 6 times overnight is concerning especially given his history. Patient has been placed on a cardiac monitor, IV inserted, labs pending. Initial EKG is normal    ----------------------------------------- 10:10 AM on 11/03/2016 -----------------------------------------   OBSERVATION CARE: This patient is being placed under observation care for the following reasons: Chest pain with repeat testing to rule out ischemia   ----------------------------------------- 10:24 AM on 11/03/2016 -----------------------------------------  Patient remains chest pain free. Discussed with Dr. Saralyn Pilar who agrees with 2nd troponin and discharge if normal.    ----------------------------------------- 12:54 PM on 11/03/2016 -----------------------------------------   END OF OBSERVATION STATUS: After an appropriate period of observation, this patient is being admitted due to the following reason(s):  Troponin increased        ____________________________________________   FINAL CLINICAL IMPRESSION(S) / ED DIAGNOSES  Final diagnoses:  Chest pain, unspecified type      NEW MEDICATIONS STARTED DURING THIS VISIT:  New Prescriptions   No medications on file     Note:  This document was prepared using Dragon voice recognition software and may include unintentional dictation errors.    Lavonia Drafts, MD 11/03/16 1255

## 2016-11-03 NOTE — Progress Notes (Signed)
   11/03/16 1900  Clinical Encounter Type  Visited With Patient  Visit Type Initial;Other (Comment) (HCPOA Info)  Referral From Nurse  Spiritual Encounters  Spiritual Needs Literature;Emotional  HCPOA/AD materials dropped off with patient.  [place] available to review as needed.

## 2016-11-04 ENCOUNTER — Observation Stay: Payer: Medicare Other

## 2016-11-04 DIAGNOSIS — I2510 Atherosclerotic heart disease of native coronary artery without angina pectoris: Secondary | ICD-10-CM | POA: Diagnosis not present

## 2016-11-04 DIAGNOSIS — R0789 Other chest pain: Secondary | ICD-10-CM | POA: Diagnosis not present

## 2016-11-04 DIAGNOSIS — R079 Chest pain, unspecified: Secondary | ICD-10-CM | POA: Diagnosis not present

## 2016-11-04 DIAGNOSIS — R748 Abnormal levels of other serum enzymes: Secondary | ICD-10-CM | POA: Diagnosis not present

## 2016-11-04 LAB — NM MYOCAR MULTI W/SPECT W/WALL MOTION / EF
CHL CUP MPHR: 157 {beats}/min
CHL CUP NUCLEAR SRS: 8
CSEPED: 1 min
CSEPEDS: 1 s
CSEPHR: 40 %
LVDIAVOL: 155 mL (ref 62–150)
LVSYSVOL: 74 mL
NUC STRESS TID: 0.96
Peak HR: 64 {beats}/min
Rest HR: 54 {beats}/min
SDS: 3
SSS: 8

## 2016-11-04 LAB — CBC
HCT: 31 % — ABNORMAL LOW (ref 40.0–52.0)
HEMOGLOBIN: 10.3 g/dL — AB (ref 13.0–18.0)
MCH: 28.3 pg (ref 26.0–34.0)
MCHC: 33.1 g/dL (ref 32.0–36.0)
MCV: 85.5 fL (ref 80.0–100.0)
Platelets: 205 10*3/uL (ref 150–440)
RBC: 3.63 MIL/uL — AB (ref 4.40–5.90)
RDW: 18.8 % — ABNORMAL HIGH (ref 11.5–14.5)
WBC: 6.1 10*3/uL (ref 3.8–10.6)

## 2016-11-04 LAB — HEPARIN LEVEL (UNFRACTIONATED): HEPARIN UNFRACTIONATED: 0.38 [IU]/mL (ref 0.30–0.70)

## 2016-11-04 LAB — TROPONIN I: Troponin I: 0.14 ng/mL (ref <0.03)

## 2016-11-04 LAB — GLUCOSE, CAPILLARY
Glucose-Capillary: 98 mg/dL (ref 65–99)
Glucose-Capillary: 218 mg/dL — ABNORMAL HIGH (ref 65–99)

## 2016-11-04 MED ORDER — TECHNETIUM TC 99M TETROFOSMIN IV KIT
13.4200 | PACK | Freq: Once | INTRAVENOUS | Status: AC | PRN
  Administered 2016-11-04: 13.42 via INTRAVENOUS

## 2016-11-04 MED ORDER — TECHNETIUM TC 99M TETROFOSMIN IV KIT
31.7100 | PACK | Freq: Once | INTRAVENOUS | Status: AC | PRN
  Administered 2016-11-04: 31.71 via INTRAVENOUS

## 2016-11-04 MED ORDER — REGADENOSON 0.4 MG/5ML IV SOLN
0.4000 mg | Freq: Once | INTRAVENOUS | Status: AC
  Administered 2016-11-04: 0.4 mg via INTRAVENOUS

## 2016-11-04 NOTE — Plan of Care (Signed)
Problem: Pain Managment: Goal: General experience of comfort will improve Outcome: Progressing Pt has remained pain free since admission. Assess pain frequently.

## 2016-11-04 NOTE — Progress Notes (Signed)
[facility] [place] NAME: Dennis Mullen    MR#:  [id number]  DATE OF BIRTH:  September 27, 1950  SUBJECTIVE: Admitted for chest pain, non-ST elevation MI. Patient had chest pain day before and took 6 nitroglycerin. History of CAD and 6 stents placed. No chest pain or shortness of breath.   CHIEF COMPLAINT:   Chief Complaint  Patient presents with  . Chest Pain    REVIEW OF SYSTEMS:   ROS CONSTITUTIONAL: No fever, fatigue or weakness.  EYES: No blurred or double vision.  EARS, NOSE, AND THROAT: No tinnitus or ear pain.  RESPIRATORY: No cough, shortness of breath, wheezing or hemoptysis.  CARDIOVASCULAR: No chest pain, orthopnea, edema.  GASTROINTESTINAL: No nausea, vomiting, diarrhea or abdominal pain.  GENITOURINARY: No dysuria, hematuria.  ENDOCRINE: No polyuria, nocturia,  HEMATOLOGY: No anemia, easy bruising or bleeding SKIN: No rash or lesion. MUSCULOSKELETAL: No joint pain or arthritis.   NEUROLOGIC: No tingling, numbness, weakness.  PSYCHIATRY: No anxiety or depression.   DRUG ALLERGIES:  No Known Allergies  VITALS:  Blood pressure (!) 121/54, pulse (!) 59, temperature 98.2 F (36.8 C), resp. rate 15, height 6\' 2"  (1.88 m), weight 130.8 kg (288 lb 5.8 oz), SpO2 95 %.  PHYSICAL EXAMINATION:  GENERAL:  66 y.o.-year-old patient lying in the bed with no acute distress.  EYES: Pupils equal, round, reactive to light and accommodation. No scleral icterus. Extraocular muscles intact.  HEENT: Head atraumatic, normocephalic. Oropharynx and nasopharynx clear.  NECK:  Supple, no jugular venous distention. No thyroid enlargement, no tenderness.  LUNGS: Normal breath sounds bilaterally, no wheezing, rales,rhonchi or crepitation. No use of accessory muscles of respiration.  CARDIOVASCULAR: S1, S2 normal. No murmurs, rubs, or gallops.  ABDOMEN: Soft, nontender, nondistended. Bowel sounds present. No organomegaly or mass.  EXTREMITIES: No  pedal edema, cyanosis, or clubbing.  NEUROLOGIC: Cranial nerves II through XII are intact. Muscle strength 5/5 in all extremities. Sensation intact. Gait not checked.  PSYCHIATRIC: The patient is alert and oriented x 3.  SKIN: No obvious rash, lesion, or ulcer.    LABORATORY PANEL:   CBC  Recent Labs Lab 11/04/16 0410  WBC 6.1  HGB 10.3*  HCT 31.0*  PLT 205   ------------------------------------------------------------------------------------------------------------------  Chemistries   Recent Labs Lab 11/03/16 0835  NA 140  K 4.9  CL 108  CO2 26  GLUCOSE 198*  BUN 22*  CREATININE 1.02  CALCIUM 8.7*  AST 37  ALT 23  ALKPHOS 84  BILITOT 0.6   ------------------------------------------------------------------------------------------------------------------  Cardiac Enzymes  Recent Labs Lab 11/04/16 0410  TROPONINI 0.14*   ------------------------------------------------------------------------------------------------------------------  RADIOLOGY:  Dg Chest Portable 1 View  Result Date: 11/03/2016 CLINICAL DATA:  66 year old male with central chest pain since last night. Coronary artery disease, cardiac stent. EXAM: PORTABLE CHEST 1 VIEW COMPARISON:  05/31/2016 and earlier. FINDINGS: Portable AP upright view at 0852 hours. Stable lung volumes. Mediastinal contours are stable and within normal limits. Visualized tracheal air column is within normal limits. Allowing for portable technique the lungs are clear. No pneumothorax or pleural effusion. IMPRESSION: No acute cardiopulmonary abnormality. Electronically Signed   By: Genevie Ann M.D.   On: 11/03/2016 09:07    EKG:   Orders placed or performed during the hospital encounter of 08/22/16  . EKG 12 lead  . EKG 12 lead    ASSESSMENT AND PLAN:   #1. chest pain with elevated troponins due to non-ST elevation MI. Has history of CAD, multiple stents,  symptoms concerning for angina. [facility] cardiology to see the patient.,  patient had Lexiscan stress test. Continue aspirin, beta blockers, statins, heparin drip  #2. hyperlipidemia: Continue TriCor, Crestor #3. Diabetes mellitus type 2: overall controlled,Continue Lantus, has diabetic neuropathy continue  Lyrica. #4 restless leg syndrome: Continue Requip  Depression: Continue Depakote, Cymbalta Morbid obesity;sleep panoea  All the records are reviewed and case discussed with Care Management/Social Workerr. Management plans discussed with the patient, family and they are in agreement.  CODE STATUS:full  TOTAL TIME TAKING CARE OF THIS PATIENT:35(CCT) minutes.   POSSIBLE D/C IN 1-2DAYS, DEPENDING ON CLINICAL CONDITION.   Epifanio Lesches M.D on 11/04/2016 at 12:27 PM  Between 7am to 6pm - Pager - [phone]  After 6pm go to [email or web address] - password [email or web address] [facility]  Office  [phone]  CC: Primary care physician; Lavera Guise, MD   Note: This dictation was prepared with Dragon dictation along with smaller phrase technology. Any transcriptional errors that result from this process are unintentional.

## 2016-11-04 NOTE — Care Management Obs Status (Signed)
[place] NOTIFICATION   Patient Details  Name: SUTTER AHLGREN MRN: [id number] Date of Birth: 10-Mar-1951   Medicare Observation Status Notification Given:  Yes [facility] letter)    Mardene Speak, RN 11/04/2016, 4:44 PM

## 2016-11-04 NOTE — Progress Notes (Signed)
ANTICOAGULATION CONSULT NOTE - Initial Consult  Pharmacy Consult for heparin drip Indication: chest pain/ACS  No Known Allergies  Patient Measurements: Height: 6\' 2"  (188 cm) Weight: 288 lb 5.8 oz (130.8 kg) IBW/kg (Calculated) : 82.2 Heparin Dosing Weight: 111 kg  Vital Signs: Temp: 98.2 F (36.8 C) (07/21 0418) Temp Source: Oral (07/21 0418) BP: 101/31 (07/21 0418) Pulse Rate: 52 (07/21 0418)  Labs:  Recent Labs  11/03/16 0835 11/03/16 1119 11/03/16 1459 11/03/16 1627 11/03/16 2228 11/04/16 0410  HGB 10.4*  --   --   --   --  10.3*  HCT 32.0*  --   --   --   --  31.0*  PLT 232  --   --   --   --  205  APTT  --   --  34  --  72*  --   LABPROT  --   --  14.0  --   --   --   INR  --   --  1.08  --   --   --   HEPARINUNFRC  --   --  <0.10*  --  0.35 0.38  CREATININE 1.02  --   --   --   --   --   TROPONINI <0.03 0.06*  --  0.12* 0.17*  --     Estimated Creatinine Clearance: 106.5 mL/min (by C-G formula based on SCr of 1.02 mg/dL).  Assessment: Pharmacy consulted to dose and monitor heparin drip for ACS. Patient was taking apixaban prior to admission with last reported dose on 11/02/16 @ 1900. Baseline labs were obtained.   Goal of Therapy:  Heparin level 0.3-0.7 units/ml Monitor platelets by anticoagulation protocol: Yes   Plan:  Heparin bolus of 2000 units was given since patient was on apixaban Heparin infusion was started at 1450 units/hr Will check HL and APTT 6 hours after infusion begins Continue to monitor H&H and platelets, CBC daily  7/20 @ 2223 HL 0.35, aPTT 72. Both therapeutic. Will continue current rate and will recheck Hl @ 0430. Since both levels are correlating will dose off of HL.  7/21 @ 0410 HL 0.38 therapeutic. Will continue current rate and will recheck HL w/ am labs.  Tobie Lords, PharmD Clinical Pharmacist 11/04/2016,5:01 AM

## 2016-11-04 NOTE — Progress Notes (Signed)
ANTICOAGULATION CONSULT NOTE - Initial Consult  Pharmacy Consult for heparin drip Indication: chest pain/ACS  No Known Allergies  Patient Measurements: Height: 6\' 2"  (188 cm) Weight: 288 lb 5.8 oz (130.8 kg) IBW/kg (Calculated) : 82.2 Heparin Dosing Weight: 111 kg  Vital Signs: Temp: 97.9 F (36.6 C) (07/20 2051) Temp Source: Oral (07/20 2051) BP: 147/53 (07/20 2051) Pulse Rate: 58 (07/20 2051)  Labs:  Recent Labs  11/03/16 0835 11/03/16 1119 11/03/16 1459 11/03/16 1627 11/03/16 2228  HGB 10.4*  --   --   --   --   HCT 32.0*  --   --   --   --   PLT 232  --   --   --   --   APTT  --   --  34  --  72*  LABPROT  --   --  14.0  --   --   INR  --   --  1.08  --   --   HEPARINUNFRC  --   --  <0.10*  --  0.35  CREATININE 1.02  --   --   --   --   TROPONINI <0.03 0.06*  --  0.12* 0.17*    Estimated Creatinine Clearance: 106.5 mL/min (by C-G formula based on SCr of 1.02 mg/dL).  Assessment: Pharmacy consulted to dose and monitor heparin drip for ACS. Patient was taking apixaban prior to admission with last reported dose on 11/02/16 @ 1900. Baseline labs were obtained.   Goal of Therapy:  Heparin level 0.3-0.7 units/ml Monitor platelets by anticoagulation protocol: Yes   Plan:  Heparin bolus of 2000 units was given since patient was on apixaban Heparin infusion was started at 1450 units/hr Will check HL and APTT 6 hours after infusion begins Continue to monitor H&H and platelets, CBC daily  7/20 @ 2223 HL 0.35, aPTT 72. Both therapeutic. Will continue current rate and will recheck Hl @ 0430. Since both levels are correlating will dose off of HL.  Tobie Lords, PharmD Clinical Pharmacist 11/04/2016,12:48 AM

## 2016-11-07 DIAGNOSIS — Z794 Long term (current) use of insulin: Secondary | ICD-10-CM | POA: Diagnosis not present

## 2016-11-07 DIAGNOSIS — I25118 Atherosclerotic heart disease of native coronary artery with other forms of angina pectoris: Secondary | ICD-10-CM | POA: Diagnosis not present

## 2016-11-07 DIAGNOSIS — E039 Hypothyroidism, unspecified: Secondary | ICD-10-CM | POA: Diagnosis not present

## 2016-11-07 DIAGNOSIS — E1142 Type 2 diabetes mellitus with diabetic polyneuropathy: Secondary | ICD-10-CM | POA: Diagnosis not present

## 2016-11-07 DIAGNOSIS — Z8631 Personal history of diabetic foot ulcer: Secondary | ICD-10-CM | POA: Diagnosis not present

## 2016-11-07 NOTE — Discharge Summary (Signed)
Dennis Mullen, is a 66 y.o. male  DOB 09/11/1950  MRN [id number].  Admission date:  11/03/2016  Admitting Physician  Demetrios Loll, MD  Discharge Date:  11/04/2016   Primary MD  Lavera Guise, MD  Recommendations for primary care physician for things to follow:   Follow-up with PCP in 1 week   Admission Diagnosis  Chest pain, unspecified type [R07.9]   Discharge Diagnosis  Chest pain, unspecified type [R07.9]    Active Problems:   Chest pain      Past Medical History:  Diagnosis Date  . Abnormal levels of other serum enzymes   . Anxiety   . Bipolar disorder ([place])   . BPH (benign prostatic hyperplasia)   . CAD (coronary artery disease)   . CHF (congestive heart failure) ([place])    ?  [place] Kitchen Depression   . Diabetes mellitus, type II ([place])   . Diabetes mellitus, type II, insulin dependent ([place])   . ED (erectile dysfunction)   . GERD (gastroesophageal reflux disease)   . Gout   . History of borderline personality disorder   . Hypertension   . Hypogonadism in male   . Hypothyroidism   . Insomnia   . Mood disorder ([place])   . Neuropathy   . Obesity   . OSA (obstructive sleep apnea)   . PVD (peripheral vascular disease) ([place])   . Thyroid disease     Past Surgical History:  Procedure Laterality Date  . AMPUTATION TOE Left 08/25/2016   Procedure: AMPUTATION TOE/ipj left 2nd toe;  Surgeon: Sharlotte Alamo, DPM;  Location: [facility];  Service: Podiatry;  Laterality: Left;  . CARDIAC CATHETERIZATION N/A 10/12/2015   Procedure: Left Heart Cath and Coronary Angiography;  Surgeon: Teodoro Spray, MD;  Location: [facility];  Service: Cardiovascular;  Laterality: N/A;  . CORONARY ANGIOPLASTY WITH STENT PLACEMENT     x 6 stents  . FOOT SURGERY Right    fracture repair with screw  . LEFT HEART CATH AND CORONARY  ANGIOGRAPHY Right 06/01/2016   Procedure: Left Heart Cath and Coronary Angiography;  Surgeon: Isaias Cowman, MD;  Location: [facility];  Service: Cardiovascular;  Laterality: Right;  . MASTOIDECTOMY Right   . NASAL SEPTUM SURGERY    . penial inplant    . PENILE PROSTHESIS IMPLANT    . right mastoidectomy    . SCROTOPLASTY    . TONSILLECTOMY AND ADENOIDECTOMY         History of present illness and  Hospital Course:     Kindly see H&P for history of present illness and admission details, please review complete Labs, Consult reports and Test reports for all details in brief  HPI  from the history and physical done on the day of admission 66 year old male patient with history of coronary artery disease, 6 stents placement admitted for chest pain. Patient has history of diabetes mellitus type 2, hyperlipidemia, hypothyroidism, depression, morbid obesity.   Hospital Course  #1. Chest pain with elevated troponin secondary to non-ST elevation MI.; She and took aspirin, 6 nitroglycerin before EMS arrived. Patient had history of CAD, multiple stents placed. Admitted to telemetry. Patient troponins were 0.12, 0.17, troponin 14. Started on heparin drip. Seen patient did have a Lexiscan stress test, it showed a low risk scan without ischemia, ejection fraction 30-40%. Cleared by cardiology Dr. Saralyn Pilar for discharge.  2.Atrial fibrillation, rate controlled on metoprolol, and anticoagulated with Eliquis  At discharge.  #3. History of CAD, multiple  stents, continue  Apixiban,Plavix, metoprolol, statins,Nitatrtes  4.Diabetes mellitus type 2: overall controlled,Continue Lantus,   diabetic neuropathy continue  Lyrica.    restless leg syndrome: Continue Requip  Depression: Continue Depakote, Cymbalta   Morbid obesity and sleep apnea.       Discharge Condition: Stable   Follow UP  Follow-up Information    Lavera Guise, MD Follow up in 1 week(s).   Specialty:   Internal Medicine Contact information: [place] [address] [phone]        Corey Skains, MD Follow up in 3 day(s).   Specialty:  Cardiology Contact information: [address] [place] [facility] [place] [address] [phone]             Discharge Instructions  and  Discharge Medications     Allergies as of 11/04/2016   No Known Allergies     Medication List    TAKE these medications   allopurinol 100 MG tablet Commonly known as:  ZYLOPRIM Take 100 mg by mouth daily.   amLODipine 5 MG tablet Commonly known as:  NORVASC Take 5 mg by mouth at bedtime.   apixaban 5 MG Tabs tablet Commonly known as:  ELIQUIS Take 1 tablet (5 mg total) by mouth 2 (two) times daily.   clopidogrel 75 MG tablet Commonly known as:  PLAVIX Take 75 mg by mouth daily. Reported on 07/26/2015   colchicine 0.6 MG tablet Take 0.6 mg by mouth 2 (two) times daily as needed (gout flare).   diclofenac 75 MG EC tablet Commonly known as:  VOLTAREN Take 75 mg by mouth daily as needed (gout flare).   divalproex 500 MG 24 hr tablet Commonly known as:  DEPAKOTE ER Take 1-2 tablets (500-1,000 mg total) by mouth 2 (two) times daily. Take 58m in morning and 10090min evening   DULoxetine 60 MG capsule Commonly known as:  CYMBALTA Take 1 capsule (60 mg total) by mouth daily.   esomeprazole 40 MG capsule Commonly known as:  NEXIUM Take 40 mg by mouth daily.   fenofibrate 145 MG tablet Commonly known as:  TRICOR Take 145 mg by mouth daily.   FREESTYLE LIBRE SENSOR SYSTEM Misc Use 1 each as directed. Please place prior to office visit as directed   furosemide 40 MG tablet Commonly known as:  LASIX Take 1 tablet (40 mg total) by mouth daily.   gemfibrozil 600 MG tablet Commonly known as:  LOPID gemfibrozil 600 mg tablet   glucose 4 GM chewable tablet Chew 4 g by mouth as needed for low blood sugar.   HUMALOG KWIKPEN 100 UNIT/ML  KiwkPen Generic drug:  insulin lispro Inject into the skin See admin instructions. Used ONLY as needed if additional bolus units are needed (up to 36 units/24 hrs.)   HYDROcodone-acetaminophen 5-325 MG tablet Commonly known as:  NORCO/VICODIN Take 1 tablet by mouth every 4 (four) hours as needed for moderate pain.   isosorbide mononitrate 60 MG 24 hr tablet Commonly known as:  IMDUR Take 60 mg by mouth daily.   JUBLIA 10 % Soln Generic drug:  Efinaconazole Apply 1 application topically daily.   levothyroxine 50 MCG tablet Commonly known as:  SYNTHROID, LEVOTHROID Take 50 mcg by mouth daily before breakfast.   metFORMIN 1000 MG tablet Commonly known as:  GLUCOPHAGE Take 1,000 mg by mouth 2 (two) times daily with a meal. Morning & supper   metoprolol succinate 50 MG 24 hr tablet Commonly known as:  TOPROL-XL Take  50 mg by mouth daily.   nitroGLYCERIN 0.4 MG SL tablet Commonly known as:  NITROSTAT Place 0.4 mg under the tongue every 5 (five) minutes x 3 doses as needed for chest pain.   pregabalin 100 MG capsule Commonly known as:  LYRICA Take 100 mg by mouth 3 (three) times daily before meals.   rOPINIRole 2 MG tablet Commonly known as:  REQUIP Take 2 mg by mouth 2 (two) times daily.   rosuvastatin 20 MG tablet Commonly known as:  CRESTOR Take 20 mg by mouth at bedtime.   V-GO 30 Kit Inject 1.2 Units/hr into the skin every hour. 66 units/24 hrs (36 available bolus units--12 bolus units available per meals)   VICTOZA 18 MG/3ML Sopn Generic drug:  liraglutide Inject 1.8 mg into the skin daily.   vitamin C 500 MG tablet Commonly known as:  ASCORBIC ACID Take 500 mg by mouth daily.         Diet and Activity recommendation: See Discharge Instructions above   Consults obtained - none. DR.Paraschos supposed to see the patient but after stress test is negative,pt is discharged.   Major procedures and Radiology Reports - PLEASE review detailed and final reports  for all details, in brief -      Nm Myocar Multi W/spect W/wall Motion / Ef  Result Date: 11/04/2016  Blood pressure demonstrated a normal response to exercise.  There was no ST segment deviation noted during stress.  This is a low risk study.  The left ventricular ejection fraction is moderately decreased (30-44%).    Dg Chest Portable 1 View  Result Date: 11/03/2016 CLINICAL DATA:  66 year old male with central chest pain since last night. Coronary artery disease, cardiac stent. EXAM: PORTABLE CHEST 1 VIEW COMPARISON:  05/31/2016 and earlier. FINDINGS: Portable AP upright view at 0852 hours. Stable lung volumes. Mediastinal contours are stable and within normal limits. Visualized tracheal air column is within normal limits. Allowing for portable technique the lungs are clear. No pneumothorax or pleural effusion. IMPRESSION: No acute cardiopulmonary abnormality. Electronically Signed   By: Genevie Ann M.D.   On: 11/03/2016 09:07    Micro Results     No results found for this or any previous visit (from the past 240 hour(s)).     Today   Subjective:   Caileb Rhue today has no headache,no chest abdominal pain,no new weakness tingling or numbness, feels much better wants to go home today.   Objective:   Blood pressure (!) 121/54, pulse (!) 59, temperature 98.2 F (36.8 C), resp. rate 15, height 6' 2"  (1.88 m), weight 130.8 kg (288 lb 5.8 oz), SpO2 95 %.  No intake or output data in the 24 hours ending 11/07/16 1122  Exam Awake Alert, Oriented x 3, No new F.N deficits, Normal affect [place].AT,PERRAL Supple Neck,No JVD, No cervical lymphadenopathy appriciated.  Symmetrical Chest wall movement, Good air movement bilaterally, CTAB RRR,No Gallops,Rubs or new Murmurs, No Parasternal Heave +ve B.Sounds, Abd Soft, Non tender, No organomegaly appriciated, No rebound -guarding or rigidity. No Cyanosis, Clubbing or edema, No new Rash or bruise  Data Review   CBC w Diff:  Lab Results   Component Value Date   WBC 6.1 11/04/2016   HGB 10.3 (L) 11/04/2016   HGB 14.7 01/26/2014   HCT 31.0 (L) 11/04/2016   HCT 45.6 01/26/2014   PLT 205 11/04/2016   PLT 276 01/26/2014   LYMPHOPCT 15 03/12/2016   LYMPHOPCT 14.0 09/30/2013   MONOPCT 8 03/12/2016  MONOPCT 14.2 09/30/2013   EOSPCT 2 03/12/2016   EOSPCT 0.7 09/30/2013   BASOPCT 1 03/12/2016   BASOPCT 0.5 09/30/2013    CMP:  Lab Results  Component Value Date   NA 140 11/03/2016   NA 139 01/26/2014   K 4.9 11/03/2016   K 4.7 01/26/2014   CL 108 11/03/2016   CL 103 01/26/2014   CO2 26 11/03/2016   CO2 29 01/26/2014   BUN 22 (H) 11/03/2016   BUN 20 (H) 01/26/2014   CREATININE 1.02 11/03/2016   CREATININE 1.36 (H) 01/26/2014   PROT 7.0 11/03/2016   PROT 7.5 05/27/2015   PROT 8.6 (H) 01/26/2014   ALBUMIN 3.3 (L) 11/03/2016   ALBUMIN 4.5 05/27/2015   ALBUMIN 3.6 01/26/2014   BILITOT 0.6 11/03/2016   BILITOT 0.3 05/27/2015   BILITOT 0.2 01/26/2014   ALKPHOS 84 11/03/2016   ALKPHOS 97 01/26/2014   AST 37 11/03/2016   AST 39 (H) 01/26/2014   ALT 23 11/03/2016   ALT 36 01/26/2014  .   Total Time in preparing paper work, data evaluation and todays exam - 85 minutes  Zali Kamaka M.D on 11/04/2016 at 11:22 AM    Note: This dictation was prepared with Dragon dictation along with smaller phrase technology. Any transcriptional errors that result from this process are unintentional.

## 2016-11-09 LAB — HIV ANTIBODY (ROUTINE TESTING W REFLEX): HIV SCREEN 4TH GENERATION: NONREACTIVE

## 2016-11-27 ENCOUNTER — Encounter: Payer: Self-pay | Admitting: Emergency Medicine

## 2016-11-27 ENCOUNTER — Inpatient Hospital Stay
Admission: EM | Admit: 2016-11-27 | Discharge: 2016-11-28 | DRG: 286 | Disposition: A | Discharge status: Home / Self Care | Payer: Medicare Other | Attending: Internal Medicine | Admitting: Internal Medicine

## 2016-11-27 ENCOUNTER — Emergency Department: Payer: Medicare Other

## 2016-11-27 ENCOUNTER — Inpatient Hospital Stay
Admit: 2016-11-27 | Discharge: 2016-11-27 | Disposition: A | Discharge status: Home / Self Care | Payer: Medicare Other | Attending: Internal Medicine | Admitting: Internal Medicine

## 2016-11-27 DIAGNOSIS — E1122 Type 2 diabetes mellitus with diabetic chronic kidney disease: Secondary | ICD-10-CM | POA: Diagnosis present

## 2016-11-27 DIAGNOSIS — Z79899 Other long term (current) drug therapy: Secondary | ICD-10-CM

## 2016-11-27 DIAGNOSIS — E875 Hyperkalemia: Secondary | ICD-10-CM | POA: Diagnosis present

## 2016-11-27 DIAGNOSIS — Z794 Long term (current) use of insulin: Secondary | ICD-10-CM

## 2016-11-27 DIAGNOSIS — R0602 Shortness of breath: Secondary | ICD-10-CM | POA: Diagnosis not present

## 2016-11-27 DIAGNOSIS — Z7902 Long term (current) use of antithrombotics/antiplatelets: Secondary | ICD-10-CM | POA: Diagnosis not present

## 2016-11-27 DIAGNOSIS — I208 Other forms of angina pectoris: Secondary | ICD-10-CM | POA: Diagnosis not present

## 2016-11-27 DIAGNOSIS — K219 Gastro-esophageal reflux disease without esophagitis: Secondary | ICD-10-CM | POA: Diagnosis present

## 2016-11-27 DIAGNOSIS — G4733 Obstructive sleep apnea (adult) (pediatric): Secondary | ICD-10-CM | POA: Diagnosis present

## 2016-11-27 DIAGNOSIS — I5033 Acute on chronic diastolic (congestive) heart failure: Secondary | ICD-10-CM | POA: Diagnosis not present

## 2016-11-27 DIAGNOSIS — E669 Obesity, unspecified: Secondary | ICD-10-CM | POA: Diagnosis present

## 2016-11-27 DIAGNOSIS — F319 Bipolar disorder, unspecified: Secondary | ICD-10-CM | POA: Diagnosis not present

## 2016-11-27 DIAGNOSIS — I5031 Acute diastolic (congestive) heart failure: Secondary | ICD-10-CM | POA: Diagnosis not present

## 2016-11-27 DIAGNOSIS — I130 Hypertensive heart and chronic kidney disease with heart failure and stage 1 through stage 4 chronic kidney disease, or unspecified chronic kidney disease: Secondary | ICD-10-CM | POA: Diagnosis not present

## 2016-11-27 DIAGNOSIS — N179 Acute kidney failure, unspecified: Secondary | ICD-10-CM | POA: Diagnosis present

## 2016-11-27 DIAGNOSIS — Z6835 Body mass index (BMI) 35.0-35.9, adult: Secondary | ICD-10-CM

## 2016-11-27 DIAGNOSIS — Z7901 Long term (current) use of anticoagulants: Secondary | ICD-10-CM | POA: Diagnosis not present

## 2016-11-27 DIAGNOSIS — Z89422 Acquired absence of other left toe(s): Secondary | ICD-10-CM | POA: Diagnosis not present

## 2016-11-27 DIAGNOSIS — Z955 Presence of coronary angioplasty implant and graft: Secondary | ICD-10-CM | POA: Diagnosis not present

## 2016-11-27 DIAGNOSIS — E079 Disorder of thyroid, unspecified: Secondary | ICD-10-CM | POA: Diagnosis not present

## 2016-11-27 DIAGNOSIS — N189 Chronic kidney disease, unspecified: Secondary | ICD-10-CM | POA: Diagnosis present

## 2016-11-27 DIAGNOSIS — I10 Essential (primary) hypertension: Secondary | ICD-10-CM | POA: Diagnosis not present

## 2016-11-27 DIAGNOSIS — I252 Old myocardial infarction: Secondary | ICD-10-CM

## 2016-11-27 DIAGNOSIS — I25110 Atherosclerotic heart disease of native coronary artery with unstable angina pectoris: Secondary | ICD-10-CM | POA: Diagnosis not present

## 2016-11-27 DIAGNOSIS — F419 Anxiety disorder, unspecified: Secondary | ICD-10-CM | POA: Diagnosis present

## 2016-11-27 DIAGNOSIS — I200 Unstable angina: Secondary | ICD-10-CM | POA: Diagnosis not present

## 2016-11-27 DIAGNOSIS — M109 Gout, unspecified: Secondary | ICD-10-CM | POA: Diagnosis present

## 2016-11-27 DIAGNOSIS — E785 Hyperlipidemia, unspecified: Secondary | ICD-10-CM | POA: Diagnosis not present

## 2016-11-27 DIAGNOSIS — I2510 Atherosclerotic heart disease of native coronary artery without angina pectoris: Secondary | ICD-10-CM | POA: Diagnosis not present

## 2016-11-27 DIAGNOSIS — E1151 Type 2 diabetes mellitus with diabetic peripheral angiopathy without gangrene: Secondary | ICD-10-CM | POA: Diagnosis present

## 2016-11-27 DIAGNOSIS — I509 Heart failure, unspecified: Secondary | ICD-10-CM

## 2016-11-27 DIAGNOSIS — E119 Type 2 diabetes mellitus without complications: Secondary | ICD-10-CM | POA: Diagnosis not present

## 2016-11-27 DIAGNOSIS — R079 Chest pain, unspecified: Secondary | ICD-10-CM | POA: Diagnosis present

## 2016-11-27 DIAGNOSIS — I214 Non-ST elevation (NSTEMI) myocardial infarction: Secondary | ICD-10-CM | POA: Diagnosis not present

## 2016-11-27 LAB — CBC WITH DIFFERENTIAL/PLATELET
Basophils Absolute: 0.1 10*3/uL (ref 0–0.1)
Basophils Relative: 1 %
Eosinophils Absolute: 0.2 10*3/uL (ref 0–0.7)
Eosinophils Relative: 2 %
HEMATOCRIT: 31.6 % — AB (ref 40.0–52.0)
HEMOGLOBIN: 10.2 g/dL — AB (ref 13.0–18.0)
LYMPHS ABS: 1.9 10*3/uL (ref 1.0–3.6)
LYMPHS PCT: 25 %
MCH: 27.5 pg (ref 26.0–34.0)
MCHC: 32.1 g/dL (ref 32.0–36.0)
MCV: 85.8 fL (ref 80.0–100.0)
Monocytes Absolute: 0.9 10*3/uL (ref 0.2–1.0)
Monocytes Relative: 12 %
NEUTROS ABS: 4.5 10*3/uL (ref 1.4–6.5)
NEUTROS PCT: 60 %
Platelets: 276 10*3/uL (ref 150–440)
RBC: 3.69 MIL/uL — AB (ref 4.40–5.90)
RDW: 19 % — ABNORMAL HIGH (ref 11.5–14.5)
WBC: 7.5 10*3/uL (ref 3.8–10.6)

## 2016-11-27 LAB — GLUCOSE, CAPILLARY
GLUCOSE-CAPILLARY: 169 mg/dL — AB (ref 65–99)
GLUCOSE-CAPILLARY: 202 mg/dL — AB (ref 65–99)
Glucose-Capillary: 129 mg/dL — ABNORMAL HIGH (ref 65–99)

## 2016-11-27 LAB — APTT
aPTT: 31 seconds (ref 24–36)
aPTT: 64 seconds — ABNORMAL HIGH (ref 24–36)

## 2016-11-27 LAB — PROTIME-INR
INR: 1.07
PROTHROMBIN TIME: 13.9 s (ref 11.4–15.2)

## 2016-11-27 LAB — BASIC METABOLIC PANEL
Anion gap: 7 (ref 5–15)
BUN: 27 mg/dL — AB (ref 6–20)
CHLORIDE: 104 mmol/L (ref 101–111)
CO2: 23 mmol/L (ref 22–32)
CREATININE: 1.32 mg/dL — AB (ref 0.61–1.24)
Calcium: 8.6 mg/dL — ABNORMAL LOW (ref 8.9–10.3)
GFR calc Af Amer: >60 mL/min (ref >60)
GFR calc non Af Amer: 55 mL/min — ABNORMAL LOW (ref >60)
Glucose, Bld: 314 mg/dL — ABNORMAL HIGH (ref 65–99)
POTASSIUM: 5.6 mmol/L — AB (ref 3.5–5.1)
SODIUM: 134 mmol/L — AB (ref 135–145)

## 2016-11-27 LAB — TROPONIN I
TROPONIN I: 0.14 ng/mL — AB (ref <0.03)
TROPONIN I: 0.19 ng/mL — AB (ref <0.03)
TROPONIN I: 0.22 ng/mL — AB (ref <0.03)
Troponin I: 0.23 ng/mL (ref <0.03)

## 2016-11-27 LAB — HEPARIN LEVEL (UNFRACTIONATED)
HEPARIN UNFRACTIONATED: 0.1 [IU]/mL — AB (ref 0.30–0.70)
HEPARIN UNFRACTIONATED: 0.39 [IU]/mL (ref 0.30–0.70)
Heparin Unfractionated: 0.44 IU/mL (ref 0.30–0.70)

## 2016-11-27 LAB — BRAIN NATRIURETIC PEPTIDE: B NATRIURETIC PEPTIDE 5: 490 pg/mL — AB (ref 0.0–100.0)

## 2016-11-27 MED ORDER — AMLODIPINE BESYLATE 5 MG PO TABS
5.0000 mg | ORAL_TABLET | Freq: Every day | ORAL | Status: DC
  Administered 2016-11-27: 5 mg via ORAL
  Filled 2016-11-27: qty 1

## 2016-11-27 MED ORDER — PANTOPRAZOLE SODIUM 40 MG PO TBEC
40.0000 mg | DELAYED_RELEASE_TABLET | Freq: Every day | ORAL | Status: DC
  Administered 2016-11-27 – 2016-11-28 (×2): 40 mg via ORAL
  Filled 2016-11-27 (×2): qty 1

## 2016-11-27 MED ORDER — SODIUM CHLORIDE 0.9 % WEIGHT BASED INFUSION: 3.0000 mL/kg/h | INTRAVENOUS | Status: AC

## 2016-11-27 MED ORDER — SODIUM CHLORIDE 0.9 % IV SOLN
1.0000 g | Freq: Once | INTRAVENOUS | Status: AC
  Administered 2016-11-27: 1 g via INTRAVENOUS
  Filled 2016-11-27: qty 10

## 2016-11-27 MED ORDER — ASPIRIN 81 MG PO CHEW
81.0000 mg | CHEWABLE_TABLET | ORAL | Status: AC
  Administered 2016-11-28: 81 mg via ORAL
  Filled 2016-11-27: qty 1

## 2016-11-27 MED ORDER — CLOPIDOGREL BISULFATE 75 MG PO TABS
75.0000 mg | ORAL_TABLET | Freq: Every day | ORAL | Status: DC
  Administered 2016-11-28: 75 mg via ORAL
  Filled 2016-11-27 (×2): qty 1

## 2016-11-27 MED ORDER — PERFLUTREN LIPID MICROSPHERE
1.0000 mL | INTRAVENOUS | Status: AC | PRN
  Administered 2016-11-28: 5 mL via INTRAVENOUS
  Filled 2016-11-27: qty 10

## 2016-11-27 MED ORDER — SODIUM CHLORIDE 0.9 % WEIGHT BASED INFUSION: 1.0000 mL/kg/h | INTRAVENOUS | Status: DC

## 2016-11-27 MED ORDER — DIVALPROEX SODIUM ER 500 MG PO TB24
1000.0000 mg | ORAL_TABLET | Freq: Every day | ORAL | Status: DC
  Administered 2016-11-27: 1000 mg via ORAL
  Filled 2016-11-27 (×2): qty 2

## 2016-11-27 MED ORDER — FENOFIBRATE 160 MG PO TABS
160.0000 mg | ORAL_TABLET | Freq: Every day | ORAL | Status: DC
  Administered 2016-11-27 – 2016-11-28 (×2): 160 mg via ORAL
  Filled 2016-11-27 (×2): qty 1

## 2016-11-27 MED ORDER — NITROGLYCERIN 0.4 MG SL SUBL: 0.4000 mg | SUBLINGUAL_TABLET | SUBLINGUAL | Status: DC | PRN

## 2016-11-27 MED ORDER — SODIUM CHLORIDE 0.9% FLUSH: 3.0000 mL | INTRAVENOUS | Status: DC | PRN

## 2016-11-27 MED ORDER — GEMFIBROZIL 600 MG PO TABS
600.0000 mg | ORAL_TABLET | Freq: Two times a day (BID) | ORAL | Status: DC
  Administered 2016-11-27: 600 mg via ORAL
  Filled 2016-11-27 (×2): qty 1

## 2016-11-27 MED ORDER — DIVALPROEX SODIUM ER 500 MG PO TB24: 500.0000 mg | ORAL_TABLET | Freq: Two times a day (BID) | ORAL | Status: DC

## 2016-11-27 MED ORDER — DICLOFENAC SODIUM 75 MG PO TBEC
75.0000 mg | DELAYED_RELEASE_TABLET | Freq: Every day | ORAL | Status: DC | PRN
  Filled 2016-11-27: qty 1

## 2016-11-27 MED ORDER — SODIUM CHLORIDE 0.9% FLUSH
3.0000 mL | Freq: Two times a day (BID) | INTRAVENOUS | Status: DC
  Administered 2016-11-27 – 2016-11-28 (×2): 3 mL via INTRAVENOUS

## 2016-11-27 MED ORDER — APIXABAN 5 MG PO TABS: 5.0000 mg | ORAL_TABLET | Freq: Two times a day (BID) | ORAL | Status: DC

## 2016-11-27 MED ORDER — ALLOPURINOL 100 MG PO TABS
100.0000 mg | ORAL_TABLET | Freq: Every day | ORAL | Status: DC
  Administered 2016-11-27 – 2016-11-28 (×2): 100 mg via ORAL
  Filled 2016-11-27 (×2): qty 1

## 2016-11-27 MED ORDER — METOPROLOL SUCCINATE ER 50 MG PO TB24
50.0000 mg | ORAL_TABLET | Freq: Every day | ORAL | Status: DC
  Administered 2016-11-27: 50 mg via ORAL
  Filled 2016-11-27: qty 1

## 2016-11-27 MED ORDER — NITROGLYCERIN 0.4 MG SL SUBL
0.4000 mg | SUBLINGUAL_TABLET | SUBLINGUAL | Status: DC | PRN
  Administered 2016-11-27: 0.4 mg via SUBLINGUAL
  Filled 2016-11-27: qty 1

## 2016-11-27 MED ORDER — DOCUSATE SODIUM 100 MG PO CAPS: 100.0000 mg | ORAL_CAPSULE | Freq: Two times a day (BID) | ORAL | Status: DC | PRN

## 2016-11-27 MED ORDER — COLCHICINE 0.6 MG PO TABS: 0.6000 mg | ORAL_TABLET | Freq: Two times a day (BID) | ORAL | Status: DC | PRN

## 2016-11-27 MED ORDER — FUROSEMIDE 10 MG/ML IJ SOLN
40.0000 mg | Freq: Two times a day (BID) | INTRAMUSCULAR | Status: DC
  Administered 2016-11-27 – 2016-11-28 (×2): 40 mg via INTRAVENOUS
  Filled 2016-11-27 (×2): qty 4

## 2016-11-27 MED ORDER — VITAMIN C 500 MG PO TABS
500.0000 mg | ORAL_TABLET | Freq: Every day | ORAL | Status: DC
  Administered 2016-11-27 – 2016-11-28 (×2): 500 mg via ORAL
  Filled 2016-11-27 (×2): qty 1

## 2016-11-27 MED ORDER — HEPARIN (PORCINE) IN NACL 100-0.45 UNIT/ML-% IJ SOLN
1350.0000 [IU]/h | INTRAMUSCULAR | Status: DC
  Administered 2016-11-27: 1350 [IU]/h via INTRAVENOUS
  Filled 2016-11-27 (×2): qty 250

## 2016-11-27 MED ORDER — INSULIN ASPART 100 UNIT/ML ~~LOC~~ SOLN
0.0000 [IU] | Freq: Three times a day (TID) | SUBCUTANEOUS | Status: DC
Start: 2016-11-27 — End: 2016-11-28
  Administered 2016-11-27 (×2): 2 [IU] via SUBCUTANEOUS
  Filled 2016-11-27 (×2): qty 1

## 2016-11-27 MED ORDER — ROPINIROLE HCL 1 MG PO TABS
2.0000 mg | ORAL_TABLET | Freq: Two times a day (BID) | ORAL | Status: DC
  Administered 2016-11-27 – 2016-11-28 (×3): 2 mg via ORAL
  Filled 2016-11-27 (×3): qty 2

## 2016-11-27 MED ORDER — INSULIN ASPART 100 UNIT/ML ~~LOC~~ SOLN
7.0000 [IU] | Freq: Once | SUBCUTANEOUS | Status: AC
  Administered 2016-11-27: 7 [IU] via INTRAVENOUS
  Filled 2016-11-27: qty 1

## 2016-11-27 MED ORDER — INSULIN GLARGINE 100 UNIT/ML ~~LOC~~ SOLN
20.0000 [IU] | Freq: Every day | SUBCUTANEOUS | Status: DC
  Administered 2016-11-27: 20 [IU] via SUBCUTANEOUS
  Filled 2016-11-27 (×3): qty 0.2

## 2016-11-27 MED ORDER — SODIUM BICARBONATE 8.4 % IV SOLN
50.0000 meq | Freq: Once | INTRAVENOUS | Status: AC
  Administered 2016-11-27: 50 meq via INTRAVENOUS
  Filled 2016-11-27: qty 50

## 2016-11-27 MED ORDER — ISOSORBIDE MONONITRATE ER 60 MG PO TB24
60.0000 mg | ORAL_TABLET | Freq: Every day | ORAL | Status: DC
  Administered 2016-11-27 – 2016-11-28 (×2): 60 mg via ORAL
  Filled 2016-11-27 (×2): qty 1

## 2016-11-27 MED ORDER — HEPARIN BOLUS VIA INFUSION
4000.0000 [IU] | Freq: Once | INTRAVENOUS | Status: AC
  Administered 2016-11-27: 4000 [IU] via INTRAVENOUS
  Filled 2016-11-27: qty 4000

## 2016-11-27 MED ORDER — FUROSEMIDE 10 MG/ML IJ SOLN
40.0000 mg | Freq: Once | INTRAMUSCULAR | Status: AC
  Administered 2016-11-27: 40 mg via INTRAVENOUS
  Filled 2016-11-27: qty 4

## 2016-11-27 MED ORDER — NITROGLYCERIN 0.4 MG SL SUBL
SUBLINGUAL_TABLET | SUBLINGUAL | Status: AC
  Filled 2016-11-27: qty 3

## 2016-11-27 MED ORDER — LEVOTHYROXINE SODIUM 50 MCG PO TABS
50.0000 ug | ORAL_TABLET | Freq: Every day | ORAL | Status: DC
  Administered 2016-11-28: 50 ug via ORAL
  Filled 2016-11-27: qty 1

## 2016-11-27 MED ORDER — ROSUVASTATIN CALCIUM 10 MG PO TABS
20.0000 mg | ORAL_TABLET | Freq: Every day | ORAL | Status: DC
  Administered 2016-11-27: 20 mg via ORAL
  Filled 2016-11-27: qty 2

## 2016-11-27 MED ORDER — DIVALPROEX SODIUM ER 500 MG PO TB24
500.0000 mg | ORAL_TABLET | Freq: Every day | ORAL | Status: DC
  Administered 2016-11-28: 500 mg via ORAL
  Filled 2016-11-27: qty 1

## 2016-11-27 MED ORDER — METFORMIN HCL 500 MG PO TABS
1000.0000 mg | ORAL_TABLET | Freq: Two times a day (BID) | ORAL | Status: DC
  Administered 2016-11-27: 1000 mg via ORAL
  Filled 2016-11-27: qty 2

## 2016-11-27 MED ORDER — SODIUM CHLORIDE 0.9 % IV SOLN: 1.0000 g | Freq: Once | INTRAVENOUS | Status: DC

## 2016-11-27 MED ORDER — LIRAGLUTIDE 18 MG/3ML ~~LOC~~ SOPN: 1.8000 mg | PEN_INJECTOR | Freq: Every day | SUBCUTANEOUS | Status: DC

## 2016-11-27 MED ORDER — SODIUM CHLORIDE 0.9 % IV SOLN: 250.0000 mL | INTRAVENOUS | Status: DC | PRN

## 2016-11-27 MED ORDER — DULOXETINE HCL 30 MG PO CPEP
60.0000 mg | ORAL_CAPSULE | Freq: Every day | ORAL | Status: DC
  Administered 2016-11-27 – 2016-11-28 (×2): 60 mg via ORAL
  Filled 2016-11-27 (×2): qty 2

## 2016-11-27 MED ORDER — PREGABALIN 50 MG PO CAPS
100.0000 mg | ORAL_CAPSULE | Freq: Three times a day (TID) | ORAL | Status: DC
  Administered 2016-11-27 – 2016-11-28 (×3): 100 mg via ORAL
  Filled 2016-11-27 (×3): qty 2

## 2016-11-27 MED ORDER — SODIUM CHLORIDE 0.9 % IV BOLUS (SEPSIS): 500.0000 mL | Freq: Once | INTRAVENOUS | Status: DC

## 2016-11-27 NOTE — ED Triage Notes (Signed)
Patient presents to the ED via EMS from home.  Patient was given 4 baby aspirin by EMS.  Patient reports chest pain that began last night around 5:30pm.  Patient reports taking 2 sl nitro yesterday evening which improved pain but during the night and this am patient reports increasing shortness of breath and return of chest pain.  Patent describes pain as right sided throbbing and pressure.  Patient is alert and oriented and talkative.  Patient has history of diabetes and blood sugar was 276 by EMS.  Patient is in no obvious distress at this time.

## 2016-11-27 NOTE — Progress Notes (Signed)
Pt have order for heoparin drip IV by ER. His troponin is same as last admission, negative stress test recently. This admission looks like due to CHF.  If second troponin remains stable ( no rise)- will d./c heparin drip.

## 2016-11-27 NOTE — H&P (Addendum)
[facility] NAME: Dennis Mullen    MR#:  [id number]  DATE OF BIRTH:  January 09, 1951  DATE OF ADMISSION:  11/27/2016  PRIMARY CARE PHYSICIAN: Lavera Guise, MD   REQUESTING/REFERRING PHYSICIAN: Alfred Levins  CHIEF COMPLAINT:   Chief Complaint  Patient presents with  . Chest Pain    HISTORY OF PRESENT ILLNESS: Dennis Mullen  is a 66 y.o. male with a known history of Anxiety, bipolar disorder, benign prostatic hyperplasia, coronary artery disease, CHF, depression, diabetes type 2, gastroesophageal reflux disease, hypertension, hypo-gone aneurysm, possible sleep apnea, thyroid disease, peripheral vascular disease- was in hospital 1 month ago for chest pain and slightly elevated troponin where nuclear stress test was done and which was negative for ischemia so he was sent home.. Since last night patient started having more shortness of breath and chest pain which is mainly on his right side, shortness of breath bothers him when he is lying down and he could not sleep overnight he had to sit up in the bed. Concerned with this he came to the emergency room, noted to have some ST depression on his EKG and troponin is slightly high which is same as his baseline last month.  PAST MEDICAL HISTORY:   Past Medical History:  Diagnosis Date  . Abnormal levels of other serum enzymes   . Anxiety   . Bipolar disorder ([place])   . BPH (benign prostatic hyperplasia)   . CAD (coronary artery disease)   . CHF (congestive heart failure) ([place])    ?  Marland Kitchen Depression   . Diabetes mellitus, type II ([place])   . Diabetes mellitus, type II, insulin dependent ([place])   . ED (erectile dysfunction)   . GERD (gastroesophageal reflux disease)   . Gout   . History of borderline personality disorder   . Hypertension   . Hypogonadism in male   . Hypothyroidism   . Insomnia   . Mood disorder ([place])   . Neuropathy   . Obesity   . OSA (obstructive sleep apnea)   . PVD  (peripheral vascular disease) ([place])   . Thyroid disease     PAST SURGICAL HISTORY: Past Surgical History:  Procedure Laterality Date  . AMPUTATION TOE Left 08/25/2016   Procedure: AMPUTATION TOE/ipj left 2nd toe;  Surgeon: Sharlotte Alamo, DPM;  Location: [facility];  Service: Podiatry;  Laterality: Left;  . CARDIAC CATHETERIZATION N/A 10/12/2015   Procedure: Left Heart Cath and Coronary Angiography;  Surgeon: Teodoro Spray, MD;  Location: [facility];  Service: Cardiovascular;  Laterality: N/A;  . CORONARY ANGIOPLASTY WITH STENT PLACEMENT     x 6 stents  . FOOT SURGERY Right    fracture repair with screw  . LEFT HEART CATH AND CORONARY ANGIOGRAPHY Right 06/01/2016   Procedure: Left Heart Cath and Coronary Angiography;  Surgeon: Isaias Cowman, MD;  Location: [facility];  Service: Cardiovascular;  Laterality: Right;  . MASTOIDECTOMY Right   . NASAL SEPTUM SURGERY    . penial inplant    . PENILE PROSTHESIS IMPLANT    . right mastoidectomy    . SCROTOPLASTY    . TONSILLECTOMY AND ADENOIDECTOMY      SOCIAL HISTORY:  Social History  Substance Use Topics  . Smoking status: Never Smoker  . Smokeless tobacco: Never Used  . Alcohol use No    FAMILY HISTORY:  Family History  Problem Relation Age of Onset  . Lung cancer Mother   .  Heart attack Father   . Post-traumatic stress disorder Father   . Anxiety disorder Father   . Depression Father   . Heart attack Sister   . Heart attack Brother   . Prostate cancer Neg Hx   . Kidney cancer Neg Hx     DRUG ALLERGIES: No Known Allergies  REVIEW OF SYSTEMS:   CONSTITUTIONAL: No fever, fatigue or weakness.  EYES: No blurred or double vision.  EARS, NOSE, AND THROAT: No tinnitus or ear pain.  RESPIRATORY: No cough,Some shortness of breath, wheezing or hemoptysis.  CARDIOVASCULAR: Positive for chest pain, orthopnea, no edema.  GASTROINTESTINAL: No nausea, vomiting, diarrhea or abdominal pain.  GENITOURINARY: No  dysuria, hematuria.  ENDOCRINE: No polyuria, nocturia,  HEMATOLOGY: No anemia, easy bruising or bleeding SKIN: No rash or lesion. MUSCULOSKELETAL: No joint pain or arthritis.   NEUROLOGIC: No tingling, numbness, weakness.  PSYCHIATRY: No anxiety or depression.   MEDICATIONS AT HOME:  Prior to Admission medications   Medication Sig Start Date End Date Taking? Authorizing Provider  allopurinol (ZYLOPRIM) 100 MG tablet Take 100 mg by mouth daily.    Yes [provider]  amLODipine (NORVASC) 5 MG tablet Take 5 mg by mouth at bedtime.  10/14/14  Yes [provider]  apixaban (ELIQUIS) 5 MG TABS tablet Take 1 tablet (5 mg total) by mouth 2 (two) times daily. 06/02/16  Yes Sudini, Alveta Heimlich, MD  clopidogrel (PLAVIX) 75 MG tablet Take 75 mg by mouth daily. Reported on 07/26/2015   Yes [provider]  colchicine 0.6 MG tablet Take 0.6 mg by mouth 2 (two) times daily as needed (gout flare).  11/08/15  Yes [provider]  Continuous Blood Gluc Sensor (FREESTYLE LIBRE SENSOR SYSTEM) MISC Use 1 each as directed. Please place prior to office visit as directed 06/20/16  Yes [provider]  diclofenac (VOLTAREN) 75 MG EC tablet Take 75 mg by mouth daily as needed (gout flare).  12/09/15  Yes [provider]  divalproex (DEPAKOTE ER) 500 MG 24 hr tablet Take 1-2 tablets (500-1,000 mg total) by mouth 2 (two) times daily. Take 536m in morning and 10047min evening 08/24/16  Yes Clapacs, JoMadie RenoMD  DULoxetine (CYMBALTA) 60 MG capsule Take 1 capsule (60 mg total) by mouth daily. 08/24/16  Yes Clapacs, JoMadie RenoMD  esomeprazole (NEXIUM) 40 MG capsule Take 40 mg by mouth daily.  10/14/14  Yes [provider]  fenofibrate (TRICOR) 145 MG tablet Take 145 mg by mouth daily.  12/23/15 12/22/16 Yes [provider]  furosemide (LASIX) 40 MG tablet Take 1 tablet (40 mg total) by mouth daily. Patient taking differently: Take 40 mg by mouth daily. Pt takes medication  every other day 09/25/15  Yes Wieting, Richard, MD  gemfibrozil (LOPID) 600 MG tablet gemfibrozil 600 mg tablet   Yes [provider]  glucose 4 GM chewable tablet Chew 4 g by mouth as needed for low blood sugar.    Yes [provider]  Insulin Disposable Pump (V-GO 30) KIT Inject 1.2 Units/hr into the skin every hour. 66 units/24 hrs (36 available bolus units--12 bolus units available per meals) 10/07/14  Yes [provider]  insulin lispro (HUMALOG KWIKPEN) 100 UNIT/ML KiwkPen Inject into the skin See admin instructions. Used ONLY as needed if additional bolus units are needed (up to 36 units/24 hrs.)   Yes [provider]  isosorbide mononitrate (IMDUR) 60 MG 24 hr tablet Take 60 mg by mouth daily.  05/09/16 05/09/17  Yes [provider]  JUBLIA 10 % SOLN Apply 1 application topically daily. 10/31/16  Yes [provider]  levothyroxine (SYNTHROID, LEVOTHROID) 50 MCG tablet Take 50 mcg by mouth daily before breakfast.   Yes [provider]  metFORMIN (GLUCOPHAGE) 1000 MG tablet Take 1,000 mg by mouth 2 (two) times daily with a meal. Morning & supper   Yes [provider]  metoprolol succinate (TOPROL-XL) 50 MG 24 hr tablet Take 50 mg by mouth daily.  09/11/14  Yes [provider]  nitroGLYCERIN (NITROSTAT) 0.4 MG SL tablet Place 0.4 mg under the tongue every 5 (five) minutes x 3 doses as needed for chest pain.    Yes [provider]  pregabalin (LYRICA) 100 MG capsule Take 100 mg by mouth 3 (three) times daily before meals.    Yes [provider]  rOPINIRole (REQUIP) 2 MG tablet Take 2 mg by mouth 2 (two) times daily.  04/27/15  Yes [provider]  rosuvastatin (CRESTOR) 20 MG tablet Take 20 mg by mouth at bedtime.    Yes [provider]  VICTOZA 18 MG/3ML SOPN Inject 1.8 mg into the skin daily.  09/07/14  Yes [provider]  vitamin C (ASCORBIC ACID) 500 MG tablet Take 500 mg by  mouth daily.   Yes [provider]      PHYSICAL EXAMINATION:   VITAL SIGNS: Blood pressure (!) 145/71, pulse 66, temperature 98.4 F (36.9 C), temperature source Oral, resp. rate 17, height 6' 2"  (1.88 m), weight 129.3 kg (285 lb), SpO2 99 %.  GENERAL:  66 y.o.-year-old patient lying in the bed with no acute distress.  EYES: Pupils equal, round, reactive to light and accommodation. No scleral icterus. Extraocular muscles intact.  HEENT: Head atraumatic, normocephalic. Oropharynx and nasopharynx clear.  NECK:  Supple, no jugular venous distention. No thyroid enlargement, no tenderness.  LUNGS: Normal breath sounds bilaterally, no wheezing, Bilateral crepitation. No use of accessory muscles of respiration. Using oxygen via nasal cannula currently in ER. CARDIOVASCULAR: S1, S2 normal. Systolic murmurs, no rubs, or gallops.  ABDOMEN: Soft, nontender, nondistended. Bowel sounds present. No organomegaly or mass.  EXTREMITIES: No pedal edema, cyanosis, or clubbing.  NEUROLOGIC: Cranial nerves II through XII are intact. Muscle strength 5/5 in all extremities. Sensation intact. Gait not checked.  PSYCHIATRIC: The patient is alert and oriented x 3.  SKIN: No obvious rash, lesion, or ulcer.   LABORATORY PANEL:   CBC  Recent Labs Lab 11/27/16 0743  WBC 7.5  HGB 10.2*  HCT 31.6*  PLT 276  MCV 85.8  MCH 27.5  MCHC 32.1  RDW 19.0*  LYMPHSABS 1.9  MONOABS 0.9  EOSABS 0.2  BASOSABS 0.1   ------------------------------------------------------------------------------------------------------------------  Chemistries   Recent Labs Lab 11/27/16 0743  NA 134*  K 5.6*  CL 104  CO2 23  GLUCOSE 314*  BUN 27*  CREATININE 1.32*  CALCIUM 8.6*   ------------------------------------------------------------------------------------------------------------------ estimated creatinine clearance is 80.8 mL/min (A) (by C-G formula based on SCr of 1.32 mg/dL  (H)). ------------------------------------------------------------------------------------------------------------------ No results for input(s): TSH, T4TOTAL, T3FREE, THYROIDAB in the last 72 hours.  Invalid input(s): FREET3   Coagulation profile No results for input(s): INR, PROTIME in the last 168 hours. ------------------------------------------------------------------------------------------------------------------- No results for input(s): DDIMER in the last 72 hours. -------------------------------------------------------------------------------------------------------------------  Cardiac Enzymes  Recent Labs Lab 11/27/16 0743  TROPONINI 0.14*   ------------------------------------------------------------------------------------------------------------------ Invalid input(s): POCBNP  ---------------------------------------------------------------------------------------------------------------  Urinalysis    Component Value Date/Time   COLORURINE YELLOW (A) 11/14/2014 1415  APPEARANCEUR CLEAR (A) 11/14/2014 1415   APPEARANCEUR Clear 01/26/2014 0759   LABSPEC 1.013 11/14/2014 1415   LABSPEC 1.021 01/26/2014 0759   PHURINE 5.0 11/14/2014 1415   GLUCOSEU NEGATIVE 11/14/2014 1415   GLUCOSEU >=500 01/26/2014 0759   HGBUR NEGATIVE 11/14/2014 1415   BILIRUBINUR NEGATIVE 11/14/2014 1415   BILIRUBINUR Negative 01/26/2014 0759   KETONESUR NEGATIVE 11/14/2014 1415   PROTEINUR NEGATIVE 11/14/2014 1415   NITRITE NEGATIVE 11/14/2014 1415   LEUKOCYTESUR NEGATIVE 11/14/2014 1415   LEUKOCYTESUR Negative 01/26/2014 0759     RADIOLOGY: Dg Chest 2 View  Result Date: 11/27/2016 CLINICAL DATA:  Chest pain EXAM: CHEST  2 VIEW COMPARISON:  11/03/2016 FINDINGS: Borderline cardiomegaly. There is generalized Kerley lines and fissure thickening. No effusion or pneumothorax. Negative aorta and hila. IMPRESSION: Pulmonary interstitial edema. Electronically Signed   By: Monte Fantasia  M.D.   On: 11/27/2016 08:26    EKG: Orders placed or performed during the hospital encounter of 11/27/16  . ED EKG  . ED EKG  . ED EKG  . ED EKG  . EKG 12-Lead  . EKG 12-Lead  . EKG 12-Lead  . EKG 12-Lead    IMPRESSION AND PLAN:  * Acute diastolic congestive heart failure   Chest pain    IV Lasix, intake and output measurement, fluid restriction.   Echocardiogram, cardiology consult.   Monitor on telemetry with serial troponin.  * History of coronary artery disease and stents   Continue Elequis, Plavix, Imdur, metoprolol, rosuvastatin.  * Diabetes   Continue metformin, Victoza, stop his insulin pump and give sliding scale coverage here.  * Hypertension.   Continue medications and monitor.  * Hyperlipidemia   Continue home medication and monitor.  * Acute renal insufficiency   Continue monitoring with the requiring Lasix.  * Hyperkalemia    Continue monitoring now as patient will need IV Lasix so we may drop by itself.   No EKG changes.   All the records are reviewed and case discussed with ED provider. Management plans discussed with the patient, family and they are in agreement.  CODE STATUS: full code  Code Status History    Date Active Date Inactive Code Status Order ID Comments User Context   11/03/2016  3:31 PM 11/04/2016  8:36 PM Full Code [id number]  Demetrios Loll, MD Inpatient   08/25/2016 11:42 AM 08/25/2016  6:20 PM Full Code [id number]  Sharlotte Alamo, DPM Inpatient   05/31/2016 12:18 PM 06/02/2016  4:31 PM Full Code [id number]  Gladstone Lighter, MD ED   03/12/2016  5:57 PM 03/13/2016  7:52 PM Full Code [id number]  Nicholes Mango, MD Inpatient   02/02/2016  1:37 AM 02/03/2016 10:08 PM Full Code [id number]  Holley Raring, NP ED   10/11/2015 12:44 PM 10/12/2015  5:30 PM Full Code [id number]  Vaughan Basta, MD Inpatient   09/24/2015  3:12 AM 09/25/2015  3:06 PM Full Code [id number]  Saundra Shelling, MD Inpatient   11/14/2014  5:07 AM 11/15/2014  4:53 PM Full Code  [id number]  Harrie Foreman, MD ED       TOTAL TIME TAKING CARE OF THIS PATIENT: 4 Minutes.    Vaughan Basta M.D on 11/27/2016   Between 7am to 6pm - Pager - [phone]  After 6pm go to [email or web address] - password [email or web address] [facility]  Office  [phone]  CC: Primary care physician; Lavera Guise, MD   Note: This dictation was prepared with Dragon dictation along with smaller phrase technology. Any  transcriptional errors that result from this process are unintentional.

## 2016-11-27 NOTE — Progress Notes (Signed)
Patient converted back to NSR.

## 2016-11-27 NOTE — Progress Notes (Addendum)
Inpatient Diabetes Program Recommendations  AACE/ADA: New Consensus Statement on Inpatient Glycemic Control (2015)  Target Ranges:  Prepandial:   less than 140 mg/dL      Peak postprandial:   less than 180 mg/dL (1-2 hours)      Critically ill patients:  140 - 180 mg/dL   Results for TYREK, LAWHORN (MRN [id number]) as of 11/27/2016 11:03  Ref. Range 11/27/2016 07:43  Glucose Latest Ref Range: 65 - 99 mg/dL 314 (H)    Admit with: CP  History: DM, CHF  Home DM Meds: VGO 30 Disposable Insulin Pump (gets 30 units basal rate and uses 6 clicks which is equivalent to 12 units per meal)       Victoza 1.8 mg daily       Metformin 1000 mg BID  Current Insulin Orders: Novolog Sensitive Correction Scale/ SSI (0-9 units) TID AC     MD- Patient saw his Endocrinologist (Dr. Adella Hare with [facility]) on 11/07/16.  At that visit, patient was instructed to continue his current diabetes regimen at home.  Uses the disposable VGO Insulin pump at home which provides 30 units basal insulin.  Patient also to give himself 6 clicks with each meal which is equivalent to 12 units per meal.  Please consider the following:  1. Start Lantus 30 units daily (start today)  2. Start Novolog Meal Coverage: Novolog 6 units TID with meals to start (hold if pt eats <50% of meal)     Addendum 1:45pm- Spoke with pt today.  Verified home DM meds as above.  VGO insulin pump was taken off this AM. Discussed with patient that we can give him Lantus as a substitute for the basal rate he gets on his VGO pump.  Patient was OK with this plan.  Discussed with patient that when he gets the Lantus today that the Lantus will last until 24 hours from time administered.  If pt discharges home tomorrow, pt will need to resume VGO insulin pump approximately 24 hours after last dose Lantus given to avoid overlap of insulin and risk of Hypoglycemia.  Patient stated he understood.  Spoke with Dr. Anselm Jungling and received orders to  start Lantus 20 units daily today.  Orders entered in [facility].       --Will follow patient during hospitalization--  Wyn Quaker RN, MSN, CDE Diabetes Coordinator Inpatient Glycemic Control Team Team Pager: [phone] (8a-5p)

## 2016-11-27 NOTE — Progress Notes (Addendum)
[place] for Heparin Indication: chest pain/ACS  No Known Allergies  Patient Measurements: Height: 6\' 2"  (188 cm) Weight: 285 lb (129.3 kg) IBW/kg (Calculated) : 82.2 Heparin Dosing Weight: 110.7 kg  Vital Signs: Temp: 98.1 F (36.7 C) (08/13 1059) Temp Source: Oral (08/13 0752) BP: 144/65 (08/13 1059) Pulse Rate: 69 (08/13 1059)  Labs:  Recent Labs  11/27/16 0743 11/27/16 0845 11/27/16 1223 11/27/16 1604  HGB 10.2*  --   --   --   HCT 31.6*  --   --   --   PLT 276  --   --   --   APTT  --  31  --  64*  LABPROT  --  13.9  --   --   INR  --  1.07  --   --   HEPARINUNFRC  --  0.10*  --  0.44  CREATININE 1.32*  --   --   --   TROPONINI 0.14*  --  0.23*  --     Estimated Creatinine Clearance: 80.8 mL/min (A) (by C-G formula based on SCr of 1.32 mg/dL (H)).   Medical History: Past Medical History:  Diagnosis Date  . Abnormal levels of other serum enzymes   . Anxiety   . Bipolar disorder ([place])   . BPH (benign prostatic hyperplasia)   . CAD (coronary artery disease)   . CHF (congestive heart failure) ([place])    ?  Marland Kitchen Depression   . Diabetes mellitus, type II ([place])   . Diabetes mellitus, type II, insulin dependent ([place])   . ED (erectile dysfunction)   . GERD (gastroesophageal reflux disease)   . Gout   . History of borderline personality disorder   . Hypertension   . Hypogonadism in male   . Hypothyroidism   . Insomnia   . Mood disorder ([place])   . Neuropathy   . Obesity   . OSA (obstructive sleep apnea)   . PVD (peripheral vascular disease) ([place])   . Thyroid disease       Assessment: 66 yo male with chest pain starting on heparin drip. Per ED MD note, pt with hx of CAD s/p multiple stents. No mention of hx of bleeding complications. Pt noted to be on Eliquis PTA. Called RN who spoke to pt, stated last dose of Eliquis was last night (8/12) at 6 pm.   Hgb 10.2, Plt 276  APTT 31  Heparin level= 0.10  INR 1.07  Goal  of Therapy:  Heparin level 0.3-0.7 units/ml Monitor platelets by anticoagulation protocol: Yes   Plan:  STAT aPTT, INR, and heparin level ordered for baseline labs- called RN Will give heparin 4000 units IV x1 for ACS indication as it has been more than 12 hours since last Eliquis dose then heparin drip at 1350 units/hr (=13.5 ml/hr) aPTT 6 h after start of infusion CBC in AM  8/13:  HL@ 1604= 0.44. (aPTT= 64). Will continue current drip rate and check confirmatory Heparin level  in 6 hrs at 2200.   Pharmacy will continue to follow.   Analya Louissaint A 11/27/2016,4:47 PM

## 2016-11-27 NOTE — Progress Notes (Signed)
ANTICOAGULATION CONSULT NOTE - Initial Consult  Pharmacy Consult for Heparin Indication: chest pain/ACS  No Known Allergies  Patient Measurements: Height: 6\' 2"  (188 cm) Weight: 285 lb (129.3 kg) IBW/kg (Calculated) : 82.2 Heparin Dosing Weight: 110.7 kg  Vital Signs: Temp: 98.4 F (36.9 C) (08/13 0752) Temp Source: Oral (08/13 0752) BP: 134/56 (08/13 0830) Pulse Rate: 70 (08/13 0830)  Labs:  Recent Labs  11/27/16 0743  HGB 10.2*  HCT 31.6*  PLT 276  CREATININE 1.32*  TROPONINI 0.14*    Estimated Creatinine Clearance: 80.8 mL/min (A) (by C-G formula based on SCr of 1.32 mg/dL (H)).   Medical History: Past Medical History:  Diagnosis Date  . Abnormal levels of other serum enzymes   . Anxiety   . Bipolar disorder ([place])   . BPH (benign prostatic hyperplasia)   . CAD (coronary artery disease)   . CHF (congestive heart failure) ([place])    ?  Marland Kitchen Depression   . Diabetes mellitus, type II ([place])   . Diabetes mellitus, type II, insulin dependent ([place])   . ED (erectile dysfunction)   . GERD (gastroesophageal reflux disease)   . Gout   . History of borderline personality disorder   . Hypertension   . Hypogonadism in male   . Hypothyroidism   . Insomnia   . Mood disorder ([place])   . Neuropathy   . Obesity   . OSA (obstructive sleep apnea)   . PVD (peripheral vascular disease) ([place])   . Thyroid disease       Assessment: 66 yo male with chest pain starting on heparin drip. Per ED MD note, pt with hx of CAD s/p multiple stents. No mention of hx of bleeding complications. Pt noted to be on Eliquis PTA. Called RN who spoke to pt, stated last dose of Eliquis was last night (8/12) at 6 pm.   Hgb 10.2, Plt 276  Goal of Therapy:  Heparin level 0.3-0.7 units/ml Monitor platelets by anticoagulation protocol: Yes   Plan:  STAT aPTT, INR, and heparin level ordered for baseline labs- called RN Will give heparin 4000 units IV x1 for ACS indication as it has been more than  12 hours since last Eliquis dose then heparin drip at 1350 units/hr (=13.5 ml/hr) aPTT 6 h after start of infusion CBC in AM  Pharmacy will continue to follow.   Rocky Morel 11/27/2016,8:46 AM

## 2016-11-27 NOTE — Progress Notes (Signed)
Noted troponin 0.14- o.23 now  Will cont Heparin drip and let cardiology decide firther plan.  Stopped eliquis.

## 2016-11-27 NOTE — Progress Notes (Signed)
Patient converted from NSR to AFIB.  Patient is running around 110bpm.  Dr. Marthann Schiller aware.

## 2016-11-27 NOTE — ED Provider Notes (Signed)
[facility] Emergency Department Provider Note  ____________________________________________  Time seen: Approximately 7:34 AM  I have reviewed the triage vital signs and the nursing notes.   HISTORY  Chief Complaint Chest Pain   HPI Dennis Mullen is a 66 y.o. male with h/o CAD s/p multiple stents, CHF (EF 30-45%), DM, HTN, PVD who presents for evaluation of chest pain.Patient reports moderate chest pressure that started yesterday at 5 PM. He took 2 sublingual nitros with resolution of the pain. This morning when he woke up the pain was back but milder. Currently endorses 2 out of 10 chest pressure, located in the center of his chest, constant and nonradiating. Patient endorses shortness of breath associated with this episode. No fever or chills, no cough or congestion, no nausea or vomiting, no diaphoresis, no lightheadedness. He endorses compliance with his medications. Denies weight gain or lower extremity edema.  Past Medical History:  Diagnosis Date  . Abnormal levels of other serum enzymes   . Anxiety   . Bipolar disorder ([place])   . BPH (benign prostatic hyperplasia)   . CAD (coronary artery disease)   . CHF (congestive heart failure) ([place])    ?  Marland Kitchen Depression   . Diabetes mellitus, type II ([place])   . Diabetes mellitus, type II, insulin dependent ([place])   . ED (erectile dysfunction)   . GERD (gastroesophageal reflux disease)   . Gout   . History of borderline personality disorder   . Hypertension   . Hypogonadism in male   . Hypothyroidism   . Insomnia   . Mood disorder ([place])   . Neuropathy   . Obesity   . OSA (obstructive sleep apnea)   . PVD (peripheral vascular disease) ([place])   . Thyroid disease     Patient Active Problem List   Diagnosis Date Noted  . Lymphedema 03/30/2016  . Left leg cellulitis 03/12/2016  . Insulin overdose 02/02/2016  . Hypoglycemia   . OSA on CPAP   . Depression   . Hypophosphatemia   . SOB (shortness of  breath) 10/11/2015  . Elevated troponin 10/11/2015  . Chest pain at rest 09/24/2015  . Elevated PSA 07/26/2015  . Hypogonadism in male 07/26/2015  . 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 BPH with obstruction/lower urinary tract symptoms 06/29/2015  . Bipolar 1 disorder, mixed ([place]) 04/14/2015  . Borderline personality disorder 04/14/2015  . Acute renal failure ([place]) 11/15/2014  . Chest pain 11/14/2014  . Dizziness 11/05/2014  . Benign essential HTN 11/04/2014  . Combined fat and carbohydrate induced hyperlipemia 11/04/2014  . Bipolar 2 disorder ([place]) 10/27/2014  . Acquired hypothyroidism 02/03/2014  . Type 2 diabetes mellitus ([place]) 02/03/2014  . Adiposity 01/06/2014  . Obesity, diabetes, and hypertension syndrome ([place]) 12/23/2013  . Long term current use of insulin ([place]) 12/23/2013  . Type 2 diabetes mellitus with other diabetic neurological complication ([place]) 36/46/8032  . Disorder affecting the body's metabolism 12/23/2013  . Acute inflammation of the pancreas 11/18/2013  . Elevated cholesterol with elevated triglycerides 11/18/2013  . CAD (coronary artery disease), native coronary artery 09/26/2013  . Diabetes mellitus ([place]) 09/26/2013  . Acute non-ST elevation myocardial infarction (NSTEMI) ([place]) 09/26/2013  . Non-ST elevation (NSTEMI) myocardial infarction [facility]) 09/26/2013    Past Surgical History:  Procedure Laterality Date  . AMPUTATION TOE Left 08/25/2016   Procedure: AMPUTATION TOE/ipj left 2nd toe;  Surgeon: Sharlotte Alamo, DPM;  Location: [facility] ORS;  Service: Podiatry;  Laterality: Left;  . CARDIAC  CATHETERIZATION N/A 10/12/2015   Procedure: Left Heart Cath and Coronary Angiography;  Surgeon: Teodoro Spray, MD;  Location: [place] [facility];  Service: Cardiovascular;  Laterality: N/A;  . CORONARY ANGIOPLASTY WITH STENT PLACEMENT     x 6 stents  . FOOT SURGERY Right    fracture repair with screw  . LEFT HEART CATH AND CORONARY ANGIOGRAPHY Right 06/01/2016     Procedure: Left Heart Cath and Coronary Angiography;  Surgeon: Isaias Cowman, MD;  Location: [place] [facility];  Service: Cardiovascular;  Laterality: Right;  . MASTOIDECTOMY Right   . NASAL SEPTUM SURGERY    . penial inplant    . PENILE PROSTHESIS IMPLANT    . right mastoidectomy    . SCROTOPLASTY    . TONSILLECTOMY AND ADENOIDECTOMY      Prior to Admission medications   Medication Sig Start Date End Date Taking? Authorizing Provider  allopurinol (ZYLOPRIM) 100 MG tablet Take 100 mg by mouth daily.    Yes [provider]  amLODipine (NORVASC) 5 MG tablet Take 5 mg by mouth at bedtime.  10/14/14  Yes [provider]  apixaban (ELIQUIS) 5 MG TABS tablet Take 1 tablet (5 mg total) by mouth 2 (two) times daily. 06/02/16  Yes Sudini, Alveta Heimlich, MD  clopidogrel (PLAVIX) 75 MG tablet Take 75 mg by mouth daily. Reported on 07/26/2015   Yes [provider]  colchicine 0.6 MG tablet Take 0.6 mg by mouth 2 (two) times daily as needed (gout flare).  11/08/15  Yes [provider]  Continuous Blood Gluc Sensor (FREESTYLE LIBRE SENSOR SYSTEM) MISC Use 1 each as directed. Please place prior to office visit as directed 06/20/16  Yes [provider]  diclofenac (VOLTAREN) 75 MG EC tablet Take 75 mg by mouth daily as needed (gout flare).  12/09/15  Yes [provider]  divalproex (DEPAKOTE ER) 500 MG 24 hr tablet Take 1-2 tablets (500-1,000 mg total) by mouth 2 (two) times daily. Take 538m in morning and 10065min evening 08/24/16  Yes Clapacs, JoMadie RenoMD  DULoxetine (CYMBALTA) 60 MG capsule Take 1 capsule (60 mg total) by mouth daily. 08/24/16  Yes Clapacs, JoMadie RenoMD  esomeprazole (NEXIUM) 40 MG capsule Take 40 mg by mouth daily.  10/14/14  Yes [provider]  fenofibrate (TRICOR) 145 MG tablet Take 145 mg by mouth daily.  12/23/15 12/22/16 Yes [provider]  furosemide (LASIX) 40 MG tablet Take 1 tablet (40 mg total) by mouth  daily. Patient taking differently: Take 40 mg by mouth daily. Pt takes medication every other day 09/25/15  Yes Wieting, Richard, MD  gemfibrozil (LOPID) 600 MG tablet gemfibrozil 600 mg tablet   Yes [provider]  glucose 4 GM chewable tablet Chew 4 g by mouth as needed for low blood sugar.    Yes [provider]  Insulin Disposable Pump (V-GO 30) KIT Inject 1.2 Units/hr into the skin every hour. 66 units/24 hrs (36 available bolus units--12 bolus units available per meals) 10/07/14  Yes [provider]  insulin lispro (HUMALOG KWIKPEN) 100 UNIT/ML KiwkPen Inject into the skin See admin instructions. Used ONLY as needed if additional bolus units are needed (up to 36 units/24 hrs.)   Yes [provider]  isosorbide mononitrate (IMDUR) 60 MG 24 hr tablet Take 60 mg by mouth daily.  05/09/16 05/09/17 Yes [provider]  JUBLIA 10 % SOLN Apply 1 application topically daily. 10/31/16  Yes [provider]  levothyroxine (  SYNTHROID, LEVOTHROID) 50 MCG tablet Take 50 mcg by mouth daily before breakfast.   Yes [provider]  metFORMIN (GLUCOPHAGE) 1000 MG tablet Take 1,000 mg by mouth 2 (two) times daily with a meal. Morning & supper   Yes [provider]  metoprolol succinate (TOPROL-XL) 50 MG 24 hr tablet Take 50 mg by mouth daily.  09/11/14  Yes [provider]  nitroGLYCERIN (NITROSTAT) 0.4 MG SL tablet Place 0.4 mg under the tongue every 5 (five) minutes x 3 doses as needed for chest pain.    Yes [provider]  pregabalin (LYRICA) 100 MG capsule Take 100 mg by mouth 3 (three) times daily before meals.    Yes [provider]  rOPINIRole (REQUIP) 2 MG tablet Take 2 mg by mouth 2 (two) times daily.  04/27/15  Yes [provider]  rosuvastatin (CRESTOR) 20 MG tablet Take 20 mg by mouth at bedtime.    Yes [provider]  VICTOZA 18 MG/3ML SOPN Inject 1.8 mg into the skin daily.  09/07/14  Yes  [provider]  vitamin C (ASCORBIC ACID) 500 MG tablet Take 500 mg by mouth daily.   Yes [provider]    Allergies Patient has no known allergies.  Family History  Problem Relation Age of Onset  . Lung cancer Mother   . Heart attack Father   . Post-traumatic stress disorder Father   . Anxiety disorder Father   . Depression Father   . Heart attack Sister   . Heart attack Brother   . Prostate cancer Neg Hx   . Kidney cancer Neg Hx     Social History Social History  Substance Use Topics  . Smoking status: Never Smoker  . Smokeless tobacco: Never Used  . Alcohol use No    Review of Systems  Constitutional: Negative for fever. Eyes: Negative for visual changes. ENT: Negative for sore throat. Neck: No neck pain  Cardiovascular: + chest pain. Respiratory: + shortness of breath. Gastrointestinal: Negative for abdominal pain, vomiting or diarrhea. Genitourinary: Negative for dysuria. Musculoskeletal: Negative for back pain. Skin: Negative for rash. Neurological: Negative for headaches, weakness or numbness. Psych: No SI or HI  ____________________________________________   PHYSICAL EXAM:  VITAL SIGNS: Vitals:   11/27/16 0752  BP: (!) 149/55  Pulse: 70  Resp: 18  Temp: 98.4 F (36.9 C)  SpO2: 96%   Constitutional: Alert and oriented. Well appearing and in no apparent distress. HEENT:      Head: Normocephalic and atraumatic.         Eyes: Conjunctivae are normal. Sclera is non-icteric.       Mouth/Throat: Mucous membranes are moist.       Neck: Supple with no signs of meningismus. Cardiovascular: Regular rate and rhythm. No murmurs, gallops, or rubs. 2+ symmetrical distal pulses are present in all extremities. No JVD. Respiratory: Normal respiratory effort. Lungs are clear to auscultation bilaterally. No wheezes, crackles, or rhonchi.  Gastrointestinal: Soft, non tender, and non distended with positive bowel sounds. No rebound or  guarding. Musculoskeletal: Nontender with normal range of motion in all extremities. No edema, cyanosis, or erythema of extremities. Neurologic: Normal speech and language. Face is symmetric. Moving all extremities. No gross focal neurologic deficits are appreciated. Skin: Skin is warm, dry and intact. No rash noted. Psychiatric: Mood and affect are normal. Speech and behavior are normal.  ____________________________________________   LABS (all labs ordered are listed, but only abnormal results are displayed)  Labs Reviewed  CBC WITH DIFFERENTIAL/PLATELET - Abnormal; Notable for the following:       Result Value   RBC 3.69 (*)    Hemoglobin 10.2 (*)    HCT 31.6 (*)    RDW 19.0 (*)    All other components within normal limits  BASIC METABOLIC PANEL - Abnormal; Notable for the following:    Sodium 134 (*)    Potassium 5.6 (*)    Glucose, Bld 314 (*)    BUN 27 (*)    Creatinine, Ser 1.32 (*)    Calcium 8.6 (*)    GFR calc non Af Amer 55 (*)    All other components within normal limits  BRAIN NATRIURETIC PEPTIDE - Abnormal; Notable for the following:    B Natriuretic Peptide 490.0 (*)    All other components within normal limits  TROPONIN I - Abnormal; Notable for the following:    Troponin I 0.14 (*)    All other components within normal limits  APTT  PROTIME-INR   ____________________________________________  EKG  ED ECG REPORT I, Rudene Re, the attending physician, personally viewed and interpreted this ECG.  Normal sinus rhythm, rate of 71, normal intervals, normal axis, diffuse ST depressions in inferior and lateral leads, no ST elevation. These are new from prior.   08:33 - Normal sinus rhythm, rate of 68, normal intervals, normal axis, persistent diffuse ST depressions in inferior and lateral leads with no ST elevation. ____________________________________________  RADIOLOGY  CXR: Pulmonary interstitial edema.   ____________________________________________   PROCEDURES  Procedure(s) performed: None Procedures Critical Care performed: yes  CRITICAL CARE Performed by: Rudene Re  ?  Total critical care time: 35 min  Critical care time was exclusive of separately billable procedures and treating other patients.  Critical care was necessary to treat or prevent imminent or life-threatening deterioration.  Critical care was time spent personally by me on the following activities: development of treatment plan with patient and/or surrogate as well as nursing, discussions with consultants, evaluation of patient's response to treatment, examination of patient, obtaining history from patient or surrogate, ordering and performing treatments and interventions, ordering and review of laboratory studies, ordering and review of radiographic studies, pulse oximetry and re-evaluation of patient's condition.   ____________________________________________   INITIAL IMPRESSION / ASSESSMENT AND PLAN / ED COURSE  66 y.o. male with h/o CAD s/p multiple stents, CHF (EF 30-45%), DM, HTN, PVD who presents for evaluation of chest pain.Patient with dynamic EKG changes showing diffuse ST depressions in inferior lateral leads but no ST elevation which is new when compared to prior. Patient received a full dose of aspirin per EMS. Currently complaining of 2 out of 10 pain. Will be given nitroglycerin. We'll get a repeat EKG to rule out developing STEMI. Anticipate admission to the hospital for ACS.    _________________________ 8:36 AM on 11/27/2016 -----------------------------------------  Troponin elevated at 0.14. CXR with interstitial edema, no O2 requirement, BNP elevated will give IV lasix. Repeat EKG with no STE but persistent ST depressions. Patient is pain free. Heparin ordered for ACS/ NSTEMI. K elevated, calcium, bicarb, insulin ordered. Will admit to the hospitalist.   Pertinent labs & imaging  results that were available during my care of the patient were reviewed by me and considered in my medical decision making (see chart for details).    ____________________________________________   FINAL CLINICAL IMPRESSION(S) / ED DIAGNOSES  Final diagnoses:  NSTEMI (non-ST elevated myocardial infarction) ([place])  Hyperkalemia  Acute renal failure superimposed on chronic kidney disease,  unspecified CKD stage, unspecified acute renal failure type ([place])  Acute on chronic congestive heart failure, unspecified heart failure type ([place])      NEW MEDICATIONS STARTED DURING THIS VISIT:  New Prescriptions   No medications on file     Note:  This document was prepared using Dragon voice recognition software and may include unintentional dictation errors.    Rudene Re, MD 11/27/16 [phone]

## 2016-11-28 ENCOUNTER — Encounter
Admission: EM | Disposition: A | Discharge status: Home / Self Care | Payer: Self-pay | Source: Home / Self Care | Attending: Internal Medicine

## 2016-11-28 ENCOUNTER — Inpatient Hospital Stay: Payer: Medicare Other

## 2016-11-28 DIAGNOSIS — Z79899 Other long term (current) drug therapy: Secondary | ICD-10-CM | POA: Diagnosis not present

## 2016-11-28 DIAGNOSIS — R0602 Shortness of breath: Secondary | ICD-10-CM | POA: Diagnosis not present

## 2016-11-28 DIAGNOSIS — I208 Other forms of angina pectoris: Secondary | ICD-10-CM | POA: Diagnosis not present

## 2016-11-28 DIAGNOSIS — Z955 Presence of coronary angioplasty implant and graft: Secondary | ICD-10-CM | POA: Diagnosis not present

## 2016-11-28 DIAGNOSIS — N189 Chronic kidney disease, unspecified: Secondary | ICD-10-CM | POA: Diagnosis not present

## 2016-11-28 DIAGNOSIS — I200 Unstable angina: Secondary | ICD-10-CM | POA: Diagnosis not present

## 2016-11-28 DIAGNOSIS — I25110 Atherosclerotic heart disease of native coronary artery with unstable angina pectoris: Secondary | ICD-10-CM | POA: Diagnosis not present

## 2016-11-28 DIAGNOSIS — I252 Old myocardial infarction: Secondary | ICD-10-CM | POA: Diagnosis not present

## 2016-11-28 DIAGNOSIS — E119 Type 2 diabetes mellitus without complications: Secondary | ICD-10-CM | POA: Diagnosis not present

## 2016-11-28 DIAGNOSIS — I130 Hypertensive heart and chronic kidney disease with heart failure and stage 1 through stage 4 chronic kidney disease, or unspecified chronic kidney disease: Secondary | ICD-10-CM | POA: Diagnosis not present

## 2016-11-28 DIAGNOSIS — K219 Gastro-esophageal reflux disease without esophagitis: Secondary | ICD-10-CM | POA: Diagnosis not present

## 2016-11-28 DIAGNOSIS — I5031 Acute diastolic (congestive) heart failure: Secondary | ICD-10-CM | POA: Diagnosis not present

## 2016-11-28 DIAGNOSIS — Z89422 Acquired absence of other left toe(s): Secondary | ICD-10-CM | POA: Diagnosis not present

## 2016-11-28 DIAGNOSIS — I10 Essential (primary) hypertension: Secondary | ICD-10-CM | POA: Diagnosis not present

## 2016-11-28 DIAGNOSIS — R079 Chest pain, unspecified: Secondary | ICD-10-CM | POA: Diagnosis not present

## 2016-11-28 DIAGNOSIS — I5033 Acute on chronic diastolic (congestive) heart failure: Secondary | ICD-10-CM | POA: Diagnosis not present

## 2016-11-28 DIAGNOSIS — N179 Acute kidney failure, unspecified: Secondary | ICD-10-CM | POA: Diagnosis not present

## 2016-11-28 DIAGNOSIS — Z7902 Long term (current) use of antithrombotics/antiplatelets: Secondary | ICD-10-CM | POA: Diagnosis not present

## 2016-11-28 DIAGNOSIS — Z794 Long term (current) use of insulin: Secondary | ICD-10-CM | POA: Diagnosis not present

## 2016-11-28 DIAGNOSIS — E875 Hyperkalemia: Secondary | ICD-10-CM | POA: Diagnosis not present

## 2016-11-28 DIAGNOSIS — E079 Disorder of thyroid, unspecified: Secondary | ICD-10-CM | POA: Diagnosis not present

## 2016-11-28 DIAGNOSIS — Z7901 Long term (current) use of anticoagulants: Secondary | ICD-10-CM | POA: Diagnosis not present

## 2016-11-28 DIAGNOSIS — I2510 Atherosclerotic heart disease of native coronary artery without angina pectoris: Secondary | ICD-10-CM | POA: Diagnosis not present

## 2016-11-28 DIAGNOSIS — E1122 Type 2 diabetes mellitus with diabetic chronic kidney disease: Secondary | ICD-10-CM | POA: Diagnosis not present

## 2016-11-28 DIAGNOSIS — E785 Hyperlipidemia, unspecified: Secondary | ICD-10-CM | POA: Diagnosis not present

## 2016-11-28 HISTORY — PX: LEFT HEART CATH AND CORONARY ANGIOGRAPHY: CATH118249

## 2016-11-28 LAB — HEPARIN LEVEL (UNFRACTIONATED): HEPARIN UNFRACTIONATED: 0.39 [IU]/mL (ref 0.30–0.70)

## 2016-11-28 LAB — GLUCOSE, CAPILLARY
GLUCOSE-CAPILLARY: 117 mg/dL — AB (ref 65–99)
GLUCOSE-CAPILLARY: 203 mg/dL — AB (ref 65–99)
GLUCOSE-CAPILLARY: 257 mg/dL — AB (ref 65–99)
Glucose-Capillary: 162 mg/dL — ABNORMAL HIGH (ref 65–99)

## 2016-11-28 LAB — CBC
HEMATOCRIT: 34.8 % — AB (ref 40.0–52.0)
HEMOGLOBIN: 11.3 g/dL — AB (ref 13.0–18.0)
MCH: 27.6 pg (ref 26.0–34.0)
MCHC: 32.6 g/dL (ref 32.0–36.0)
MCV: 84.6 fL (ref 80.0–100.0)
Platelets: 272 10*3/uL (ref 150–440)
RBC: 4.12 MIL/uL — ABNORMAL LOW (ref 4.40–5.90)
RDW: 18.6 % — AB (ref 11.5–14.5)
WBC: 6.5 10*3/uL (ref 3.8–10.6)

## 2016-11-28 LAB — CARDIAC CATHETERIZATION: CATHEFQUANT: 55 %

## 2016-11-28 LAB — BASIC METABOLIC PANEL
ANION GAP: 8 (ref 5–15)
BUN: 25 mg/dL — ABNORMAL HIGH (ref 6–20)
CALCIUM: 9.3 mg/dL (ref 8.9–10.3)
CO2: 26 mmol/L (ref 22–32)
CREATININE: 1.17 mg/dL (ref 0.61–1.24)
Chloride: 106 mmol/L (ref 101–111)
GFR calc non Af Amer: >60 mL/min (ref >60)
Glucose, Bld: 118 mg/dL — ABNORMAL HIGH (ref 65–99)
Potassium: 4.3 mmol/L (ref 3.5–5.1)
SODIUM: 140 mmol/L (ref 135–145)

## 2016-11-28 LAB — ECHOCARDIOGRAM COMPLETE
HEIGHTINCHES: 74 in
WEIGHTICAEL: 4560 [oz_av]

## 2016-11-28 SURGERY — LEFT HEART CATH AND CORONARY ANGIOGRAPHY
Anesthesia: Moderate Sedation

## 2016-11-28 MED ORDER — LIDOCAINE HCL (PF) 1 % IJ SOLN
INTRAMUSCULAR | Status: DC | PRN
  Administered 2016-11-28: 20 mL via INTRADERMAL

## 2016-11-28 MED ORDER — SODIUM CHLORIDE 0.9% FLUSH: 3.0000 mL | INTRAVENOUS | Status: DC | PRN

## 2016-11-28 MED ORDER — ACETAMINOPHEN 325 MG PO TABS: 650.0000 mg | ORAL_TABLET | ORAL | Status: DC | PRN

## 2016-11-28 MED ORDER — ASPIRIN 81 MG PO CHEW
CHEWABLE_TABLET | ORAL | Status: AC
  Filled 2016-11-28: qty 1

## 2016-11-28 MED ORDER — SODIUM CHLORIDE 0.9 % WEIGHT BASED INFUSION
1.0000 mL/kg/h | INTRAVENOUS | Status: DC
  Administered 2016-11-28: 1 mL/kg/h via INTRAVENOUS

## 2016-11-28 MED ORDER — LIDOCAINE HCL (PF) 1 % IJ SOLN
INTRAMUSCULAR | Status: AC
  Filled 2016-11-28: qty 30

## 2016-11-28 MED ORDER — SODIUM CHLORIDE 0.9% FLUSH: 3.0000 mL | Freq: Two times a day (BID) | INTRAVENOUS | Status: DC

## 2016-11-28 MED ORDER — ONDANSETRON HCL 4 MG/2ML IJ SOLN: 4.0000 mg | Freq: Four times a day (QID) | INTRAMUSCULAR | Status: DC | PRN

## 2016-11-28 MED ORDER — SODIUM CHLORIDE 0.9 % IV SOLN: 250.0000 mL | INTRAVENOUS | Status: DC | PRN

## 2016-11-28 MED ORDER — IOPAMIDOL (ISOVUE-300) INJECTION 61%
INTRAVENOUS | Status: DC | PRN
  Administered 2016-11-28: 130 mL via INTRA_ARTERIAL

## 2016-11-28 MED ORDER — MIDAZOLAM HCL 2 MG/2ML IJ SOLN
INTRAMUSCULAR | Status: AC
  Filled 2016-11-28: qty 2

## 2016-11-28 MED ORDER — ASPIRIN 81 MG PO CHEW
CHEWABLE_TABLET | ORAL | Status: AC
  Administered 2016-11-28: 81 mg
  Filled 2016-11-28: qty 1

## 2016-11-28 MED ORDER — FENTANYL CITRATE (PF) 100 MCG/2ML IJ SOLN
INTRAMUSCULAR | Status: AC
  Filled 2016-11-28: qty 2

## 2016-11-28 SURGICAL SUPPLY — 11 items
CATH 5FR JL4 DIAGNOSTIC (CATHETERS) ×2 IMPLANT
CATH INFINITI 5FR ANG PIGTAIL (CATHETERS) ×2 IMPLANT
CATH INFINITI JR4 5F (CATHETERS) ×2 IMPLANT
DEVICE CLOSURE MYNXGRIP 5F (Vascular Products) ×2 IMPLANT
DEVICE SAFEGUARD 24CM (GAUZE/BANDAGES/DRESSINGS) ×2 IMPLANT
KIT MANI 3VAL PERCEP (MISCELLANEOUS) ×3 IMPLANT
NDL PERC 18GX7CM (NEEDLE) IMPLANT
NEEDLE PERC 18GX7CM (NEEDLE) ×3 IMPLANT
PACK CARDIAC CATH (CUSTOM PROCEDURE TRAY) ×3 IMPLANT
SHEATH AVANTI 5FR X 11CM (SHEATH) ×2 IMPLANT
WIRE EMERALD 3MM-J .035X150CM (WIRE) ×2 IMPLANT

## 2016-11-28 NOTE — Discharge Instructions (Signed)
Heart Failure Clinic appointment on December 06 2016 at 8:20am with Darylene Price, [place]. Please call [phone] to reschedule.

## 2016-11-28 NOTE — Progress Notes (Signed)
IV and tele removed. Discharge instructions given to pt. Cath site no bleeding or hematoma. Pt reprots no pain. A & O. Ambulating in the room. Pt has no further concerns at this time.

## 2016-11-28 NOTE — Progress Notes (Signed)
ANTICOAGULATION CONSULT NOTE   Pharmacy Consult for Heparin Indication: chest pain/ACS  No Known Allergies  Patient Measurements: Height: 6\' 2"  (188 cm) Weight: 275 lb 12.8 oz (125.1 kg) IBW/kg (Calculated) : 82.2 Heparin Dosing Weight: 110.7 kg  Vital Signs: Temp: 97.8 F (36.6 C) (08/14 0344) Temp Source: Oral (08/14 0344) BP: 140/84 (08/14 0344) Pulse Rate: 59 (08/14 0344)  Labs:  Recent Labs  11/27/16 0743  11/27/16 0845 11/27/16 1223 11/27/16 1604 11/27/16 2044 11/27/16 2201 11/28/16 0624  HGB 10.2*  --   --   --   --   --   --  11.3*  HCT 31.6*  --   --   --   --   --   --  34.8*  PLT 276  --   --   --   --   --   --  272  APTT  --   --  31  --  64*  --   --   --   LABPROT  --   --  13.9  --   --   --   --   --   INR  --   --  1.07  --   --   --   --   --   HEPARINUNFRC  --   < > 0.10*  --  0.44  --  0.39 0.39  CREATININE 1.32*  --   --   --   --   --   --  1.17  TROPONINI 0.14*  --   --  0.23* 0.19* 0.22*  --   --   < > = values in this interval not displayed.  Estimated Creatinine Clearance: 89.7 mL/min (by C-G formula based on SCr of 1.17 mg/dL).   Medical History: Past Medical History:  Diagnosis Date  . Abnormal levels of other serum enzymes   . Anxiety   . Bipolar disorder ([place])   . BPH (benign prostatic hyperplasia)   . CAD (coronary artery disease)   . CHF (congestive heart failure) ([place])    ?  Marland Kitchen Depression   . Diabetes mellitus, type II ([place])   . Diabetes mellitus, type II, insulin dependent ([place])   . ED (erectile dysfunction)   . GERD (gastroesophageal reflux disease)   . Gout   . History of borderline personality disorder   . Hypertension   . Hypogonadism in male   . Hypothyroidism   . Insomnia   . Mood disorder ([place])   . Neuropathy   . Obesity   . OSA (obstructive sleep apnea)   . PVD (peripheral vascular disease) ([place])   . Thyroid disease       Assessment: 66 yo male with chest pain starting on heparin drip. Per ED MD  note, pt with hx of CAD s/p multiple stents. No mention of hx of bleeding complications. Pt noted to be on Eliquis PTA. Called RN who spoke to pt, stated last dose of Eliquis was last night (8/12) at 6 pm.   Hgb 10.2, Plt 276  APTT 31  Heparin level= 0.10  INR 1.07  Goal of Therapy:  Heparin level 0.3-0.7 units/ml Monitor platelets by anticoagulation protocol: Yes   Plan:  STAT aPTT, INR, and heparin level ordered for baseline labs- called RN Will give heparin 4000 units IV x1 for ACS indication as it has been more than 12 hours since last Eliquis dose then heparin drip at 1350 units/hr (=13.5 ml/hr) aPTT 6 h after start  of infusion CBC in AM  8/13:  HL@ 1604= 0.44. (aPTT= 64). Will continue current drip rate and check confirmatory Heparin level  in 6 hrs at 2200.   8/14 @ 0624 HL 0.39 therapeutic x 2. Will continue current rate and will recheck HL w/ am labs.  Pharmacy will continue to follow.   Tobie Lords, PharmD, BCPS Clinical Pharmacist 11/28/2016

## 2016-11-28 NOTE — Progress Notes (Signed)
Report called to Junious Dresser, Therapist, sports. Right groin remains clean, dry, intact without hematoma, edema, ecchymosis, redness. Denies c/o HA, SOB, N/V, dizziness. Stable for tx back to room.

## 2016-11-28 NOTE — Care Management (Signed)
Presents from home with shortness of breath and chest pain. Is not requiring supplemental oxygen. Receiving IV lasix.  Recent stress test. Cardiology consult pending. Current with PCP. No issues accessing medical care, obtaining medications or with transportation.   Independent in his adls.  Does not require assistance with ambulation and no devices. No services in the home at present.

## 2016-11-28 NOTE — OR Nursing (Signed)
Heparin running at 13.5 stopped at 11:45 by Ulysees Barns, [place]

## 2016-11-29 ENCOUNTER — Encounter: Payer: Self-pay | Admitting: Internal Medicine

## 2016-11-29 NOTE — Consult Note (Signed)
Reason for Consult: Unstable angina known coronary disease Referring Physician: Dr. Suzan Garibaldi primary, Dr Anselm Jungling hospitalist  Dennis Mullen is an 66 y.o. male.  HPI: 66 year old male known coronary disease anxiety bipolar disorder BPH congestive heart failure depression diabetes type 2 reflux hypertension history of GERD PCI and stent the past multiple occasion most recent cardiac catheter was February 2018. Most recent cardiac catheter September 2017 at [facility]. Patient started having unstable anginal symptoms presented to Hospital for evaluation he was most recently admitted about a month ago and had a functional study which was unremarkable. Patient has had persistent recurrent chest pain and anginal symptoms  Past Medical History:  Diagnosis Date  . Abnormal levels of other serum enzymes   . Anxiety   . Bipolar disorder ([place])   . BPH (benign prostatic hyperplasia)   . CAD (coronary artery disease)   . CHF (congestive heart failure) ([place])    ?  Marland Kitchen Depression   . Diabetes mellitus, type II ([place])   . Diabetes mellitus, type II, insulin dependent ([place])   . ED (erectile dysfunction)   . GERD (gastroesophageal reflux disease)   . Gout   . History of borderline personality disorder   . Hypertension   . Hypogonadism in male   . Hypothyroidism   . Insomnia   . Mood disorder ([place])   . Neuropathy   . Obesity   . OSA (obstructive sleep apnea)   . PVD (peripheral vascular disease) ([place])   . Thyroid disease     Past Surgical History:  Procedure Laterality Date  . AMPUTATION TOE Left 08/25/2016   Procedure: AMPUTATION TOE/ipj left 2nd toe;  Surgeon: Sharlotte Alamo, DPM;  Location: [facility] ORS;  Service: Podiatry;  Laterality: Left;  . CARDIAC CATHETERIZATION N/A 10/12/2015   Procedure: Left Heart Cath and Coronary Angiography;  Surgeon: Teodoro Spray, MD;  Location: [place] [facility];  Service: Cardiovascular;  Laterality: N/A;  . CORONARY ANGIOPLASTY WITH STENT PLACEMENT     x 6  stents  . FOOT SURGERY Right    fracture repair with screw  . LEFT HEART CATH AND CORONARY ANGIOGRAPHY Right 06/01/2016   Procedure: Left Heart Cath and Coronary Angiography;  Surgeon: Isaias Cowman, MD;  Location: [place] [facility];  Service: Cardiovascular;  Laterality: Right;  . LEFT HEART CATH AND CORONARY ANGIOGRAPHY N/A 11/28/2016   Procedure: LEFT HEART CATH AND CORONARY ANGIOGRAPHY and possible pci;  Surgeon: Yolonda Kida, MD;  Location: [place] [facility];  Service: Cardiovascular;  Laterality: N/A;  . MASTOIDECTOMY Right   . NASAL SEPTUM SURGERY    . penial inplant    . PENILE PROSTHESIS IMPLANT    . right mastoidectomy    . SCROTOPLASTY    . TONSILLECTOMY AND ADENOIDECTOMY      Family History  Problem Relation Age of Onset  . Lung cancer Mother   . Heart attack Father   . Post-traumatic stress disorder Father   . Anxiety disorder Father   . Depression Father   . Heart attack Sister   . Heart attack Brother   . Prostate cancer Neg Hx   . Kidney cancer Neg Hx     Social History:  reports that he has never smoked. He has never used smokeless tobacco. He reports that he does not drink alcohol or use drugs.  Allergies: No Known Allergies  Medications: I have reviewed the patient's current medications.  Results for orders placed or performed during the hospital encounter of  11/27/16 (from the past 48 hour(s))  APTT     Status: Abnormal   Collection Time: 11/27/16  4:04 PM  Result Value Ref Range   aPTT 64 (H) 24 - 36 seconds    Comment:        IF BASELINE aPTT IS ELEVATED, SUGGEST PATIENT RISK ASSESSMENT BE USED TO DETERMINE APPROPRIATE ANTICOAGULANT THERAPY.   Heparin level (unfractionated)     Status: None   Collection Time: 11/27/16  4:04 PM  Result Value Ref Range   Heparin Unfractionated 0.44 0.30 - 0.70 IU/mL    Comment:        IF HEPARIN RESULTS ARE BELOW EXPECTED VALUES, AND PATIENT DOSAGE HAS BEEN CONFIRMED, SUGGEST FOLLOW UP  TESTING OF ANTITHROMBIN III LEVELS.   Troponin I     Status: Abnormal   Collection Time: 11/27/16  4:04 PM  Result Value Ref Range   Troponin I 0.19 (HH) <0.03 ng/mL    Comment: CRITICAL VALUE NOTED. VALUE IS CONSISTENT WITH PREVIOUSLY REPORTED/CALLED VALUE.  TFK  Glucose, capillary     Status: Abnormal   Collection Time: 11/27/16  5:23 PM  Result Value Ref Range   Glucose-Capillary 202 (H) 65 - 99 mg/dL  Glucose, capillary     Status: Abnormal   Collection Time: 11/27/16  8:40 PM  Result Value Ref Range   Glucose-Capillary 129 (H) 65 - 99 mg/dL   Comment 1 Notify RN   Troponin I     Status: Abnormal   Collection Time: 11/27/16  8:44 PM  Result Value Ref Range   Troponin I 0.22 (HH) <0.03 ng/mL    Comment: CRITICAL VALUE NOTED. VALUE IS CONSISTENT WITH PREVIOUSLY REPORTED/CALLED VALUE [place]  Heparin level (unfractionated)     Status: None   Collection Time: 11/27/16 10:01 PM  Result Value Ref Range   Heparin Unfractionated 0.39 0.30 - 0.70 IU/mL    Comment:        IF HEPARIN RESULTS ARE BELOW EXPECTED VALUES, AND PATIENT DOSAGE HAS BEEN CONFIRMED, SUGGEST FOLLOW UP TESTING OF ANTITHROMBIN III LEVELS.   CBC     Status: Abnormal   Collection Time: 11/28/16  6:24 AM  Result Value Ref Range   WBC 6.5 3.8 - 10.6 K/uL   RBC 4.12 (L) 4.40 - 5.90 MIL/uL   Hemoglobin 11.3 (L) 13.0 - 18.0 g/dL   HCT 34.8 (L) 40.0 - 52.0 %   MCV 84.6 80.0 - 100.0 fL   MCH 27.6 26.0 - 34.0 pg   MCHC 32.6 32.0 - 36.0 g/dL   RDW 18.6 (H) 11.5 - 14.5 %   Platelets 272 150 - 440 K/uL  Basic metabolic panel     Status: Abnormal   Collection Time: 11/28/16  6:24 AM  Result Value Ref Range   Sodium 140 135 - 145 mmol/L   Potassium 4.3 3.5 - 5.1 mmol/L   Chloride 106 101 - 111 mmol/L   CO2 26 22 - 32 mmol/L   Glucose, Bld 118 (H) 65 - 99 mg/dL   BUN 25 (H) 6 - 20 mg/dL   Creatinine, Ser 1.17 0.61 - 1.24 mg/dL   Calcium 9.3 8.9 - 10.3 mg/dL   GFR calc non Af Amer >60 >60 mL/min   GFR calc Af Amer  >60 >60 mL/min    Comment: (NOTE) The eGFR has been calculated using the CKD EPI equation. This calculation has not been validated in all clinical situations. eGFR's persistently <60 mL/min signify possible Chronic Kidney Disease.    Anion  gap 8 5 - 15  Heparin level (unfractionated)     Status: None   Collection Time: 11/28/16  6:24 AM  Result Value Ref Range   Heparin Unfractionated 0.39 0.30 - 0.70 IU/mL    Comment:        IF HEPARIN RESULTS ARE BELOW EXPECTED VALUES, AND PATIENT DOSAGE HAS BEEN CONFIRMED, SUGGEST FOLLOW UP TESTING OF ANTITHROMBIN III LEVELS.   Glucose, capillary     Status: Abnormal   Collection Time: 11/28/16  7:26 AM  Result Value Ref Range   Glucose-Capillary 117 (H) 65 - 99 mg/dL  Glucose, capillary     Status: Abnormal   Collection Time: 11/28/16 11:27 AM  Result Value Ref Range   Glucose-Capillary 203 (H) 65 - 99 mg/dL  Glucose, capillary     Status: Abnormal   Collection Time: 11/28/16  1:44 PM  Result Value Ref Range   Glucose-Capillary 162 (H) 65 - 99 mg/dL  Glucose, capillary     Status: Abnormal   Collection Time: 11/28/16  4:31 PM  Result Value Ref Range   Glucose-Capillary 257 (H) 65 - 99 mg/dL    Dg Chest 2 View  Result Date: 11/28/2016 CLINICAL DATA:  Chest pain, shortness of breath. EXAM: CHEST  2 VIEW COMPARISON:  Radiographs of November 27, 2016. FINDINGS: The heart size and mediastinal contours are within normal limits. Both lungs are clear. No pneumothorax or pleural effusion is noted. The visualized skeletal structures are unremarkable. IMPRESSION: No active cardiopulmonary disease. Pulmonary edema noted on prior exam has resolved. Electronically Signed   By: Marijo Conception, M.D.   On: 11/28/2016 08:06    Review of Systems  Constitutional: Positive for diaphoresis and malaise/fatigue.  HENT: Positive for congestion.   Eyes: Negative.   Respiratory: Positive for shortness of breath.   Cardiovascular: Positive for chest pain,  palpitations and leg swelling.  Gastrointestinal: Negative.   Genitourinary: Negative.   Musculoskeletal: Positive for myalgias.  Skin: Negative.   Neurological: Positive for weakness.  Endo/Heme/Allergies: Negative.   Psychiatric/Behavioral: Negative.    Blood pressure (!) 135/58, pulse (!) 56, temperature 97.9 F (36.6 C), temperature source Oral, resp. rate 16, height 6' 2" (1.88 m), weight 124.7 kg (275 lb), SpO2 100 %. Physical Exam  Nursing note and vitals reviewed. Constitutional: He is oriented to person, place, and time. He appears well-developed and well-nourished.  HENT:  Head: Normocephalic and atraumatic.  Eyes: Pupils are equal, round, and reactive to light. Conjunctivae and EOM are normal.  Neck: Normal range of motion. Neck supple.  Cardiovascular: Normal rate and regular rhythm.   Murmur heard. Respiratory: Effort normal and breath sounds normal.  GI: Soft. Bowel sounds are normal.  Musculoskeletal: Normal range of motion.  Neurological: He is alert and oriented to person, place, and time. He has normal reflexes.  Skin: Skin is warm and dry.  Psychiatric: He has a normal mood and affect.    Assessment/Plan: Unstable angina Possible non-STEMI Coronary disease Diabetes type 2 GERD Hypertension Depression Thyroid disease Peripheral vascular disease History of multiple PCI and stent . Plan Agree with admission for rule out myocardial infarction Continue evaluation with troponins and EKGs Continue therapy for anxiety Agree with treatment for chronic bipolar disorder Recommend invasive evaluation with cardiac cath Continue diabetes management and control Continue CPAP sleep study weight loss for obstructive sleep apnea Have patient follow up with primary cardiologist  Soul Hackman D Jetta Murray 11/29/2016, 4:00 PM

## 2016-11-29 NOTE — Discharge Summary (Signed)
[place] [facility] NAME: Dennis Mullen    MR#:  [id number]  DATE OF BIRTH:  1950/10/02  DATE OF ADMISSION:  11/27/2016 ADMITTING PHYSICIAN: Vaughan Basta, MD  DATE OF DISCHARGE: 11/28/2016  6:30 PM  PRIMARY CARE PHYSICIAN: Lavera Guise, MD    ADMISSION DIAGNOSIS:  Hyperkalemia [E87.5] Acute CHF ([place]) [I50.9] NSTEMI (non-ST elevated myocardial infarction) ([place]) [I21.4] Acute renal failure superimposed on chronic kidney disease, unspecified CKD stage, unspecified acute renal failure type ([place]) [N17.9, N18.9] Acute on chronic congestive heart failure, unspecified heart failure type ([place]) [I50.9]  DISCHARGE DIAGNOSIS:  Principal Problem:   Acute diastolic CHF (congestive heart failure) (HCC) Active Problems:   Chest pain   Acute CHF ([place])   SECONDARY DIAGNOSIS:   Past Medical History:  Diagnosis Date  . Abnormal levels of other serum enzymes   . Anxiety   . Bipolar disorder ([place])   . BPH (benign prostatic hyperplasia)   . CAD (coronary artery disease)   . CHF (congestive heart failure) ([place])    ?  Marland Kitchen Depression   . Diabetes mellitus, type II ([place])   . Diabetes mellitus, type II, insulin dependent ([place])   . ED (erectile dysfunction)   . GERD (gastroesophageal reflux disease)   . Gout   . History of borderline personality disorder   . Hypertension   . Hypogonadism in male   . Hypothyroidism   . Insomnia   . Mood disorder ([place])   . Neuropathy   . Obesity   . OSA (obstructive sleep apnea)   . PVD (peripheral vascular disease) ([place])   . Thyroid disease     HOSPITAL COURSE:   * Acute diastolic congestive heart failure   Chest pain    IV Lasix, intake and output measurement, fluid restriction.   Echocardiogram, cardiology consult.   Monitor on telemetry with serial troponin.    Due to borderline troponin, kept on heparin drip and cardiac cath was done next day, which was negative.  * History of  coronary artery disease and stents   Continue Elequis, Plavix, Imdur, metoprolol, rosuvastatin.  * Diabetes   Continue metformin, Victoza, stop his insulin pump and give sliding scale coverage here.  * Hypertension.   Continue medications and monitor.  * Hyperlipidemia   Continue home medication and monitor.  * Acute renal insufficiency   Continue monitoring with the requiring Lasix.  * Hyperkalemia    Continue monitoring now as patient will need IV Lasix so we may drop by itself.   No EKG changes.   DISCHARGE CONDITIONS:   Stable.  CONSULTS OBTAINED:  Treatment Team:  Yolonda Kida, MD  DRUG ALLERGIES:  No Known Allergies  DISCHARGE MEDICATIONS:   Discharge Medication List as of 11/28/2016  4:41 PM    CONTINUE these medications which have NOT CHANGED   Details  allopurinol (ZYLOPRIM) 100 MG tablet Take 100 mg by mouth daily. , Historical Med    amLODipine (NORVASC) 5 MG tablet Take 5 mg by mouth at bedtime. , Starting Wed 10/14/2014, Historical Med    apixaban (ELIQUIS) 5 MG TABS tablet Take 1 tablet (5 mg total) by mouth 2 (two) times daily., Starting Fri 06/02/2016, Normal    clopidogrel (PLAVIX) 75 MG tablet Take 75 mg by mouth daily. Reported on 07/26/2015, Historical Med    colchicine 0.6 MG tablet Take 0.6 mg by mouth 2 (two) times daily as needed (gout flare). , Starting Mon 11/08/2015, Historical Med  Continuous Blood Gluc Sensor (FREESTYLE LIBRE SENSOR SYSTEM) MISC Use 1 each as directed. Please place prior to office visit as directed, Historical Med    diclofenac (VOLTAREN) 75 MG EC tablet Take 75 mg by mouth daily as needed (gout flare). , Starting Thu 12/09/2015, Historical Med    divalproex (DEPAKOTE ER) 500 MG 24 hr tablet Take 1-2 tablets (500-1,000 mg total) by mouth 2 (two) times daily. Take 547m in morning and 10072min evening, Starting Thu 08/24/2016, Normal    DULoxetine (CYMBALTA) 60 MG capsule Take 1 capsule (60 mg total) by mouth  daily., Starting Thu 08/24/2016, Normal    esomeprazole (NEXIUM) 40 MG capsule Take 40 mg by mouth daily. , Starting Wed 10/14/2014, Historical Med    fenofibrate (TRICOR) 145 MG tablet Take 145 mg by mouth daily. , Starting Thu 12/23/2015, Until Fri 12/22/2016, Historical Med    furosemide (LASIX) 40 MG tablet Take 1 tablet (40 mg total) by mouth daily., Starting Sat 09/25/2015, Print    gemfibrozil (LOPID) 600 MG tablet gemfibrozil 600 mg tablet, Historical Med    glucose 4 GM chewable tablet Chew 4 g by mouth as needed for low blood sugar. , Historical Med    Insulin Disposable Pump (V-GO 30) KIT Inject 1.2 Units/hr into the skin every hour. 66 units/24 hrs (36 available bolus units--12 bolus units available per meals), Starting Wed 10/07/2014, Historical Med    insulin lispro (HUMALOG KWIKPEN) 100 UNIT/ML KiwkPen Inject into the skin See admin instructions. Used ONLY as needed if additional bolus units are needed (up to 36 units/24 hrs.), Historical Med    isosorbide mononitrate (IMDUR) 60 MG 24 hr tablet Take 60 mg by mouth daily. , Starting Tue 05/09/2016, Until Wed 05/09/2017, Historical Med    JUBLIA 10 % SOLN Apply 1 application topically daily., Starting Tue 10/31/2016, Historical Med    levothyroxine (SYNTHROID, LEVOTHROID) 50 MCG tablet Take 50 mcg by mouth daily before breakfast., Historical Med    metFORMIN (GLUCOPHAGE) 1000 MG tablet Take 1,000 mg by mouth 2 (two) times daily with a meal. Morning & supper, Historical Med    metoprolol succinate (TOPROL-XL) 50 MG 24 hr tablet Take 50 mg by mouth daily. , Starting Fri 09/11/2014, Historical Med    nitroGLYCERIN (NITROSTAT) 0.4 MG SL tablet Place 0.4 mg under the tongue every 5 (five) minutes x 3 doses as needed for chest pain. , Historical Med    pregabalin (LYRICA) 100 MG capsule Take 100 mg by mouth 3 (three) times daily before meals. , Historical Med    rOPINIRole (REQUIP) 2 MG tablet Take 2 mg by mouth 2 (two) times daily. ,  Starting Tue 04/27/2015, Historical Med    rosuvastatin (CRESTOR) 20 MG tablet Take 20 mg by mouth at bedtime. , Historical Med    VICTOZA 18 MG/3ML SOPN Inject 1.8 mg into the skin daily. , Starting Mon 09/07/2014, Historical Med    vitamin C (ASCORBIC ACID) 500 MG tablet Take 500 mg by mouth daily., Historical Med         DISCHARGE INSTRUCTIONS:    Follow with cardio clinic in 1-2 weeks.  If you experience worsening of your admission symptoms, develop shortness of breath, life threatening emergency, suicidal or homicidal thoughts you must seek medical attention immediately by calling 911 or calling your MD immediately  if symptoms less severe.  You Must read complete instructions/literature along with all the possible adverse reactions/side effects for all the Medicines you take and that have been prescribed  to you. Take any new Medicines after you have completely understood and accept all the possible adverse reactions/side effects.   Please note  You were cared for by a hospitalist during your hospital stay. If you have any questions about your discharge medications or the care you received while you were in the hospital after you are discharged, you can call the unit and asked to speak with the hospitalist on call if the hospitalist that took care of you is not available. Once you are discharged, your primary care physician will handle any further medical issues. Please note that NO REFILLS for any discharge medications will be authorized once you are discharged, as it is imperative that you return to your primary care physician (or establish a relationship with a primary care physician if you do not have one) for your aftercare needs so that they can reassess your need for medications and monitor your lab values.    Today   CHIEF COMPLAINT:   Chief Complaint  Patient presents with  . Chest Pain    HISTORY OF PRESENT ILLNESS:  Dennis Mullen  is a 66 y.o. male with a known  history of Anxiety, bipolar disorder, benign prostatic hyperplasia, coronary artery disease, CHF, depression, diabetes type 2, gastroesophageal reflux disease, hypertension, hypo-gone aneurysm, possible sleep apnea, thyroid disease, peripheral vascular disease- was in hospital 1 month ago for chest pain and slightly elevated troponin where nuclear stress test was done and which was negative for ischemia so he was sent home.. Since last night patient started having more shortness of breath and chest pain which is mainly on his right side, shortness of breath bothers him when he is lying down and he could not sleep overnight he had to sit up in the bed. Concerned with this he came to the emergency room, noted to have some ST depression on his EKG and troponin is slightly high which is same as his baseline last month.   VITAL SIGNS:  Blood pressure (!) 135/58, pulse (!) 56, temperature 97.9 F (36.6 C), temperature source Oral, resp. rate 16, height 6' 2"  (1.88 m), weight 124.7 kg (275 lb), SpO2 100 %.  I/O:  No intake or output data in the 24 hours ending 11/29/16 2207  PHYSICAL EXAMINATION:  GENERAL:  66 y.o.-year-old patient lying in the bed with no acute distress.  EYES: Pupils equal, round, reactive to light and accommodation. No scleral icterus. Extraocular muscles intact.  HEENT: Head atraumatic, normocephalic. Oropharynx and nasopharynx clear.  NECK:  Supple, no jugular venous distention. No thyroid enlargement, no tenderness.  LUNGS: Normal breath sounds bilaterally, no wheezing, rales,rhonchi or crepitation. No use of accessory muscles of respiration.  CARDIOVASCULAR: S1, S2 normal. No murmurs, rubs, or gallops.  ABDOMEN: Soft, non-tender, non-distended. Bowel sounds present. No organomegaly or mass.  EXTREMITIES: No pedal edema, cyanosis, or clubbing.  NEUROLOGIC: Cranial nerves II through XII are intact. Muscle strength 5/5 in all extremities. Sensation intact. Gait not checked.   PSYCHIATRIC: The patient is alert and oriented x 3.  SKIN: No obvious rash, lesion, or ulcer.   DATA REVIEW:   CBC  Recent Labs Lab 11/28/16 0624  WBC 6.5  HGB 11.3*  HCT 34.8*  PLT 272    Chemistries   Recent Labs Lab 11/28/16 0624  NA 140  K 4.3  CL 106  CO2 26  GLUCOSE 118*  BUN 25*  CREATININE 1.17  CALCIUM 9.3    Cardiac Enzymes  Recent Labs Lab 11/27/16 2044  TROPONINI  0.22*    Microbiology Results  Results for orders placed or performed during the hospital encounter of 02/01/16  MRSA PCR Screening     Status: None   Collection Time: 02/02/16  3:10 AM  Result Value Ref Range Status   MRSA by PCR NEGATIVE NEGATIVE Final    Comment:        The GeneXpert MRSA Assay (FDA approved for NASAL specimens only), is one component of a comprehensive MRSA colonization surveillance program. It is not intended to diagnose MRSA infection nor to guide or monitor treatment for MRSA infections.   Aerobic/Anaerobic Culture (surgical/deep wound)     Status: None   Collection Time: 02/03/16  5:11 PM  Result Value Ref Range Status   Specimen Description WOUND RIGHT FOOT  Final   Special Requests NONE  Final   Gram Stain   Final    FEW WBC PRESENT,BOTH PMN AND MONONUCLEAR MODERATE GRAM POSITIVE COCCI IN PAIRS IN SINGLES RARE GRAM NEGATIVE DIPLOCOCCI    Culture   Final    ABUNDANT STAPHYLOCOCCUS AUREUS NO ANAEROBES ISOLATED Performed at [facility]    Report Status 02/09/2016 FINAL  Final   Organism ID, Bacteria STAPHYLOCOCCUS AUREUS  Final      Susceptibility   Staphylococcus aureus - MIC*    CIPROFLOXACIN <=0.5 SENSITIVE Sensitive     ERYTHROMYCIN 1 INTERMEDIATE Intermediate     GENTAMICIN <=0.5 SENSITIVE Sensitive     OXACILLIN 2 SENSITIVE Sensitive     TETRACYCLINE <=1 SENSITIVE Sensitive     VANCOMYCIN <=0.5 SENSITIVE Sensitive     TRIMETH/SULFA <=10 SENSITIVE Sensitive     CLINDAMYCIN <=0.25 SENSITIVE Sensitive     RIFAMPIN <=0.5  SENSITIVE Sensitive     Inducible Clindamycin NEGATIVE Sensitive     * ABUNDANT STAPHYLOCOCCUS AUREUS    RADIOLOGY:  Dg Chest 2 View  Result Date: 11/28/2016 CLINICAL DATA:  Chest pain, shortness of breath. EXAM: CHEST  2 VIEW COMPARISON:  Radiographs of November 27, 2016. FINDINGS: The heart size and mediastinal contours are within normal limits. Both lungs are clear. No pneumothorax or pleural effusion is noted. The visualized skeletal structures are unremarkable. IMPRESSION: No active cardiopulmonary disease. Pulmonary edema noted on prior exam has resolved. Electronically Signed   By: Marijo Conception, M.D.   On: 11/28/2016 08:06    EKG:   Orders placed or performed during the hospital encounter of 11/27/16  . ED EKG  . ED EKG  . ED EKG  . ED EKG  . EKG 12-Lead  . EKG 12-Lead  . EKG 12-Lead  . EKG 12-Lead      Management plans discussed with the patient, family and they are in agreement.  CODE STATUS:  Code Status History    Date Active Date Inactive Code Status Order ID Comments User Context   11/27/2016 11:03 AM 11/28/2016  9:35 PM Full Code [id number]  Vaughan Basta, MD Inpatient   11/03/2016  3:31 PM 11/04/2016  8:36 PM Full Code [id number]  Demetrios Loll, MD Inpatient   08/25/2016 11:42 AM 08/25/2016  6:20 PM Full Code [id number]  Sharlotte Alamo, DPM Inpatient   05/31/2016 12:18 PM 06/02/2016  4:31 PM Full Code [id number]  Gladstone Lighter, MD ED   03/12/2016  5:57 PM 03/13/2016  7:52 PM Full Code [id number]  Nicholes Mango, MD Inpatient   02/02/2016  1:37 AM 02/03/2016 10:08 PM Full Code [id number]  Holley Raring, NP ED   10/11/2015 12:44 PM 10/12/2015  5:30 PM Full Code [id number]  Vaughan Basta, MD Inpatient   09/24/2015  3:12 AM 09/25/2015  3:06 PM Full Code [id number]  Saundra Shelling, MD Inpatient   11/14/2014  5:07 AM 11/15/2014  4:53 PM Full Code [id number]  Harrie Foreman, MD ED      TOTAL TIME TAKING CARE OF THIS PATIENT: 35 minutes.    Vaughan Basta  M.D on 11/29/2016 at 10:07 PM  Between 7am to 6pm - Pager - [phone]  After 6pm go to [email or web address] - password [email or web address] [facility]  Office  [phone]  CC: Primary care physician; Lavera Guise, MD   Note: This dictation was prepared with Dragon dictation along with smaller phrase technology. Any transcriptional errors that result from this process are unintentional.

## 2016-11-30 ENCOUNTER — Emergency Department: Payer: Medicare Other

## 2016-11-30 ENCOUNTER — Encounter: Payer: Self-pay | Admitting: Emergency Medicine

## 2016-11-30 ENCOUNTER — Inpatient Hospital Stay
Admission: EM | Admit: 2016-11-30 | Discharge: 2016-12-02 | DRG: 280 | Disposition: A | Discharge status: Home / Self Care | Payer: Medicare Other | Attending: Internal Medicine | Admitting: Internal Medicine

## 2016-11-30 DIAGNOSIS — E1151 Type 2 diabetes mellitus with diabetic peripheral angiopathy without gangrene: Secondary | ICD-10-CM | POA: Diagnosis not present

## 2016-11-30 DIAGNOSIS — I350 Nonrheumatic aortic (valve) stenosis: Secondary | ICD-10-CM

## 2016-11-30 DIAGNOSIS — R0602 Shortness of breath: Secondary | ICD-10-CM | POA: Diagnosis not present

## 2016-11-30 DIAGNOSIS — I248 Other forms of acute ischemic heart disease: Secondary | ICD-10-CM | POA: Diagnosis not present

## 2016-11-30 DIAGNOSIS — Z7901 Long term (current) use of anticoagulants: Secondary | ICD-10-CM

## 2016-11-30 DIAGNOSIS — I214 Non-ST elevation (NSTEMI) myocardial infarction: Secondary | ICD-10-CM | POA: Diagnosis present

## 2016-11-30 DIAGNOSIS — N182 Chronic kidney disease, stage 2 (mild): Secondary | ICD-10-CM | POA: Diagnosis present

## 2016-11-30 DIAGNOSIS — E669 Obesity, unspecified: Secondary | ICD-10-CM | POA: Diagnosis present

## 2016-11-30 DIAGNOSIS — E1140 Type 2 diabetes mellitus with diabetic neuropathy, unspecified: Secondary | ICD-10-CM | POA: Diagnosis not present

## 2016-11-30 DIAGNOSIS — N400 Enlarged prostate without lower urinary tract symptoms: Secondary | ICD-10-CM | POA: Diagnosis not present

## 2016-11-30 DIAGNOSIS — Z79899 Other long term (current) drug therapy: Secondary | ICD-10-CM

## 2016-11-30 DIAGNOSIS — I5033 Acute on chronic diastolic (congestive) heart failure: Secondary | ICD-10-CM | POA: Diagnosis not present

## 2016-11-30 DIAGNOSIS — J810 Acute pulmonary edema: Secondary | ICD-10-CM

## 2016-11-30 DIAGNOSIS — Z794 Long term (current) use of insulin: Secondary | ICD-10-CM | POA: Diagnosis not present

## 2016-11-30 DIAGNOSIS — Z89422 Acquired absence of other left toe(s): Secondary | ICD-10-CM

## 2016-11-30 DIAGNOSIS — G4733 Obstructive sleep apnea (adult) (pediatric): Secondary | ICD-10-CM | POA: Diagnosis present

## 2016-11-30 DIAGNOSIS — I10 Essential (primary) hypertension: Secondary | ICD-10-CM | POA: Diagnosis not present

## 2016-11-30 DIAGNOSIS — F329 Major depressive disorder, single episode, unspecified: Secondary | ICD-10-CM | POA: Diagnosis present

## 2016-11-30 DIAGNOSIS — F319 Bipolar disorder, unspecified: Secondary | ICD-10-CM | POA: Diagnosis present

## 2016-11-30 DIAGNOSIS — E1165 Type 2 diabetes mellitus with hyperglycemia: Secondary | ICD-10-CM | POA: Diagnosis not present

## 2016-11-30 DIAGNOSIS — Z6835 Body mass index (BMI) 35.0-35.9, adult: Secondary | ICD-10-CM

## 2016-11-30 DIAGNOSIS — I480 Paroxysmal atrial fibrillation: Secondary | ICD-10-CM | POA: Diagnosis not present

## 2016-11-30 DIAGNOSIS — I130 Hypertensive heart and chronic kidney disease with heart failure and stage 1 through stage 4 chronic kidney disease, or unspecified chronic kidney disease: Secondary | ICD-10-CM | POA: Diagnosis not present

## 2016-11-30 DIAGNOSIS — Z955 Presence of coronary angioplasty implant and graft: Secondary | ICD-10-CM

## 2016-11-30 DIAGNOSIS — I2489 Other forms of acute ischemic heart disease: Secondary | ICD-10-CM

## 2016-11-30 DIAGNOSIS — R0603 Acute respiratory distress: Secondary | ICD-10-CM

## 2016-11-30 DIAGNOSIS — E1122 Type 2 diabetes mellitus with diabetic chronic kidney disease: Secondary | ICD-10-CM | POA: Diagnosis present

## 2016-11-30 DIAGNOSIS — I5031 Acute diastolic (congestive) heart failure: Secondary | ICD-10-CM | POA: Diagnosis not present

## 2016-11-30 DIAGNOSIS — E875 Hyperkalemia: Secondary | ICD-10-CM

## 2016-11-30 DIAGNOSIS — R079 Chest pain, unspecified: Secondary | ICD-10-CM | POA: Diagnosis not present

## 2016-11-30 DIAGNOSIS — E039 Hypothyroidism, unspecified: Secondary | ICD-10-CM | POA: Diagnosis not present

## 2016-11-30 DIAGNOSIS — J811 Chronic pulmonary edema: Secondary | ICD-10-CM | POA: Diagnosis not present

## 2016-11-30 DIAGNOSIS — J9601 Acute respiratory failure with hypoxia: Secondary | ICD-10-CM | POA: Diagnosis not present

## 2016-11-30 DIAGNOSIS — R7989 Other specified abnormal findings of blood chemistry: Secondary | ICD-10-CM | POA: Diagnosis not present

## 2016-11-30 DIAGNOSIS — I2510 Atherosclerotic heart disease of native coronary artery without angina pectoris: Secondary | ICD-10-CM | POA: Diagnosis present

## 2016-11-30 DIAGNOSIS — K219 Gastro-esophageal reflux disease without esophagitis: Secondary | ICD-10-CM | POA: Diagnosis present

## 2016-11-30 DIAGNOSIS — Z7902 Long term (current) use of antithrombotics/antiplatelets: Secondary | ICD-10-CM

## 2016-11-30 DIAGNOSIS — R0789 Other chest pain: Secondary | ICD-10-CM | POA: Diagnosis not present

## 2016-11-30 LAB — BASIC METABOLIC PANEL
Anion gap: 8 (ref 5–15)
BUN: 34 mg/dL — AB (ref 6–20)
CHLORIDE: 106 mmol/L (ref 101–111)
CO2: 24 mmol/L (ref 22–32)
Calcium: 8.8 mg/dL — ABNORMAL LOW (ref 8.9–10.3)
Creatinine, Ser: 1.49 mg/dL — ABNORMAL HIGH (ref 0.61–1.24)
GFR calc Af Amer: 55 mL/min — ABNORMAL LOW (ref >60)
GFR calc non Af Amer: 48 mL/min — ABNORMAL LOW (ref >60)
GLUCOSE: 408 mg/dL — AB (ref 65–99)
POTASSIUM: 5.4 mmol/L — AB (ref 3.5–5.1)
Sodium: 138 mmol/L (ref 135–145)

## 2016-11-30 LAB — GLUCOSE, CAPILLARY
GLUCOSE-CAPILLARY: 324 mg/dL — AB (ref 65–99)
GLUCOSE-CAPILLARY: 271 mg/dL — AB (ref 65–99)
GLUCOSE-CAPILLARY: 259 mg/dL — AB (ref 65–99)
Glucose-Capillary: 432 mg/dL — ABNORMAL HIGH (ref 65–99)
Glucose-Capillary: 382 mg/dL — ABNORMAL HIGH (ref 65–99)
Glucose-Capillary: 299 mg/dL — ABNORMAL HIGH (ref 65–99)
Glucose-Capillary: 246 mg/dL — ABNORMAL HIGH (ref 65–99)

## 2016-11-30 LAB — LIPID PANEL
CHOLESTEROL: 190 mg/dL (ref 0–200)
HDL: 43 mg/dL (ref >40)
LDL Cholesterol: 101 mg/dL — ABNORMAL HIGH (ref 0–99)
TRIGLYCERIDES: 229 mg/dL — AB (ref <150)
Total CHOL/HDL Ratio: 4.4 RATIO
VLDL: 46 mg/dL — ABNORMAL HIGH (ref 0–40)

## 2016-11-30 LAB — CBC
HEMATOCRIT: 33.7 % — AB (ref 40.0–52.0)
Hemoglobin: 11.2 g/dL — ABNORMAL LOW (ref 13.0–18.0)
MCH: 28.4 pg (ref 26.0–34.0)
MCHC: 33.1 g/dL (ref 32.0–36.0)
MCV: 85.7 fL (ref 80.0–100.0)
Platelets: 290 10*3/uL (ref 150–440)
RBC: 3.94 MIL/uL — ABNORMAL LOW (ref 4.40–5.90)
RDW: 18.9 % — AB (ref 11.5–14.5)
WBC: 8.9 10*3/uL (ref 3.8–10.6)

## 2016-11-30 LAB — TROPONIN I
TROPONIN I: 1.23 ng/mL — AB (ref <0.03)
Troponin I: 0.64 ng/mL (ref <0.03)
Troponin I: 1.64 ng/mL (ref <0.03)
Troponin I: 1.6 ng/mL (ref <0.03)

## 2016-11-30 LAB — HEPARIN LEVEL (UNFRACTIONATED)
HEPARIN UNFRACTIONATED: 0.44 [IU]/mL (ref 0.30–0.70)
HEPARIN UNFRACTIONATED: 0.37 [IU]/mL (ref 0.30–0.70)

## 2016-11-30 LAB — PROTIME-INR
INR: 1.11
Prothrombin Time: 14.4 seconds (ref 11.4–15.2)

## 2016-11-30 LAB — MRSA PCR SCREENING: MRSA BY PCR: NEGATIVE

## 2016-11-30 LAB — HEMOGLOBIN A1C
HEMOGLOBIN A1C: 7.3 % — AB (ref 4.8–5.6)
MEAN PLASMA GLUCOSE: 162.81 mg/dL

## 2016-11-30 LAB — APTT: APTT: 32 s (ref 24–36)

## 2016-11-30 MED ORDER — INSULIN ASPART 100 UNIT/ML ~~LOC~~ SOLN
6.0000 [IU] | Freq: Three times a day (TID) | SUBCUTANEOUS | Status: DC
  Administered 2016-11-30 – 2016-12-02 (×6): 6 [IU] via SUBCUTANEOUS
  Filled 2016-11-30 (×6): qty 1

## 2016-11-30 MED ORDER — METOPROLOL SUCCINATE ER 50 MG PO TB24
50.0000 mg | ORAL_TABLET | Freq: Every day | ORAL | Status: DC
  Administered 2016-11-30 – 2016-12-01 (×2): 50 mg via ORAL
  Filled 2016-11-30 (×2): qty 1

## 2016-11-30 MED ORDER — DIVALPROEX SODIUM 500 MG PO DR TAB
500.0000 mg | DELAYED_RELEASE_TABLET | Freq: Every day | ORAL | Status: DC
  Administered 2016-11-30 – 2016-12-02 (×3): 500 mg via ORAL
  Filled 2016-11-30 (×3): qty 1

## 2016-11-30 MED ORDER — SODIUM CHLORIDE 0.9% FLUSH
3.0000 mL | Freq: Two times a day (BID) | INTRAVENOUS | Status: DC
  Administered 2016-11-30 – 2016-12-02 (×5): 3 mL via INTRAVENOUS

## 2016-11-30 MED ORDER — SODIUM CHLORIDE 0.9 % IV SOLN: 250.0000 mL | INTRAVENOUS | Status: DC | PRN

## 2016-11-30 MED ORDER — FUROSEMIDE 10 MG/ML IJ SOLN
40.0000 mg | Freq: Two times a day (BID) | INTRAMUSCULAR | Status: DC
  Administered 2016-11-30 – 2016-12-01 (×3): 40 mg via INTRAVENOUS
  Filled 2016-11-30 (×3): qty 4

## 2016-11-30 MED ORDER — ASPIRIN 81 MG PO CHEW
324.0000 mg | CHEWABLE_TABLET | Freq: Once | ORAL | Status: AC
  Administered 2016-11-30: 324 mg via ORAL

## 2016-11-30 MED ORDER — ALLOPURINOL 100 MG PO TABS
100.0000 mg | ORAL_TABLET | Freq: Every day | ORAL | Status: DC
  Administered 2016-11-30 – 2016-12-02 (×3): 100 mg via ORAL
  Filled 2016-11-30 (×3): qty 1

## 2016-11-30 MED ORDER — PREGABALIN 50 MG PO CAPS
100.0000 mg | ORAL_CAPSULE | Freq: Three times a day (TID) | ORAL | Status: DC
  Administered 2016-11-30 – 2016-12-02 (×7): 100 mg via ORAL
  Filled 2016-11-30: qty 4
  Filled 2016-11-30 (×3): qty 2
  Filled 2016-11-30: qty 4
  Filled 2016-11-30 (×2): qty 2

## 2016-11-30 MED ORDER — INSULIN DETEMIR 100 UNIT/ML ~~LOC~~ SOLN
26.0000 [IU] | Freq: Every day | SUBCUTANEOUS | Status: DC
  Administered 2016-11-30: 26 [IU] via SUBCUTANEOUS
  Filled 2016-11-30 (×2): qty 0.26

## 2016-11-30 MED ORDER — DULOXETINE HCL 30 MG PO CPEP
60.0000 mg | ORAL_CAPSULE | Freq: Every day | ORAL | Status: DC
  Administered 2016-11-30 – 2016-12-02 (×3): 60 mg via ORAL
  Filled 2016-11-30: qty 1
  Filled 2016-11-30 (×2): qty 2

## 2016-11-30 MED ORDER — ASPIRIN EC 81 MG PO TBEC
81.0000 mg | DELAYED_RELEASE_TABLET | Freq: Every day | ORAL | Status: DC
  Administered 2016-12-01 – 2016-12-02 (×2): 81 mg via ORAL
  Filled 2016-11-30 (×2): qty 1

## 2016-11-30 MED ORDER — ASPIRIN 300 MG RE SUPP: 300.0000 mg | RECTAL | Status: AC

## 2016-11-30 MED ORDER — ASPIRIN 81 MG PO CHEW
324.0000 mg | CHEWABLE_TABLET | ORAL | Status: AC
  Administered 2016-11-30: 324 mg via ORAL

## 2016-11-30 MED ORDER — ROSUVASTATIN CALCIUM 10 MG PO TABS
20.0000 mg | ORAL_TABLET | Freq: Every day | ORAL | Status: DC
  Administered 2016-11-30 – 2016-12-01 (×2): 20 mg via ORAL
  Filled 2016-11-30: qty 1
  Filled 2016-11-30 (×2): qty 2

## 2016-11-30 MED ORDER — CLOPIDOGREL BISULFATE 75 MG PO TABS
75.0000 mg | ORAL_TABLET | Freq: Every day | ORAL | Status: DC
  Administered 2016-11-30 – 2016-12-02 (×3): 75 mg via ORAL
  Filled 2016-11-30 (×2): qty 1

## 2016-11-30 MED ORDER — ISOSORBIDE MONONITRATE ER 60 MG PO TB24
60.0000 mg | ORAL_TABLET | Freq: Every day | ORAL | Status: DC
  Administered 2016-11-30 – 2016-12-02 (×3): 60 mg via ORAL
  Filled 2016-11-30 (×3): qty 1

## 2016-11-30 MED ORDER — VITAMIN C 500 MG PO TABS
500.0000 mg | ORAL_TABLET | Freq: Every day | ORAL | Status: DC
  Administered 2016-11-30 – 2016-12-02 (×3): 500 mg via ORAL
  Filled 2016-11-30 (×3): qty 1

## 2016-11-30 MED ORDER — HEPARIN BOLUS VIA INFUSION
4000.0000 [IU] | Freq: Once | INTRAVENOUS | Status: AC
  Administered 2016-11-30: 4000 [IU] via INTRAVENOUS
  Filled 2016-11-30: qty 4000

## 2016-11-30 MED ORDER — FENOFIBRATE 54 MG PO TABS
54.0000 mg | ORAL_TABLET | Freq: Every day | ORAL | Status: DC
  Administered 2016-11-30 – 2016-12-02 (×3): 54 mg via ORAL
  Filled 2016-11-30 (×3): qty 1

## 2016-11-30 MED ORDER — LORAZEPAM 2 MG/ML IJ SOLN
1.0000 mg | Freq: Once | INTRAMUSCULAR | Status: AC
  Administered 2016-11-30: 1 mg via INTRAVENOUS

## 2016-11-30 MED ORDER — ACETAMINOPHEN 325 MG PO TABS
650.0000 mg | ORAL_TABLET | ORAL | Status: DC | PRN
Start: 2016-11-30 — End: 2016-12-02

## 2016-11-30 MED ORDER — SODIUM CHLORIDE 0.9% FLUSH: 3.0000 mL | INTRAVENOUS | Status: DC | PRN

## 2016-11-30 MED ORDER — GEMFIBROZIL 600 MG PO TABS
600.0000 mg | ORAL_TABLET | Freq: Two times a day (BID) | ORAL | Status: DC
  Administered 2016-11-30 – 2016-12-02 (×5): 600 mg via ORAL
  Filled 2016-11-30 (×5): qty 1

## 2016-11-30 MED ORDER — ROPINIROLE HCL 1 MG PO TABS
2.0000 mg | ORAL_TABLET | Freq: Two times a day (BID) | ORAL | Status: DC
  Administered 2016-11-30 – 2016-12-02 (×5): 2 mg via ORAL
  Filled 2016-11-30 (×5): qty 2

## 2016-11-30 MED ORDER — DIVALPROEX SODIUM 500 MG PO DR TAB
1000.0000 mg | DELAYED_RELEASE_TABLET | Freq: Every day | ORAL | Status: DC
  Administered 2016-11-30 – 2016-12-01 (×2): 1000 mg via ORAL
  Filled 2016-11-30 (×3): qty 2

## 2016-11-30 MED ORDER — INSULIN DETEMIR 100 UNIT/ML ~~LOC~~ SOLN
30.0000 [IU] | Freq: Every day | SUBCUTANEOUS | Status: DC
  Administered 2016-12-01 – 2016-12-02 (×2): 30 [IU] via SUBCUTANEOUS
  Filled 2016-11-30 (×4): qty 0.3

## 2016-11-30 MED ORDER — IOPAMIDOL (ISOVUE-370) INJECTION 76%
75.0000 mL | Freq: Once | INTRAVENOUS | Status: AC | PRN
  Administered 2016-11-30: 75 mL via INTRAVENOUS

## 2016-11-30 MED ORDER — AMLODIPINE BESYLATE 5 MG PO TABS
5.0000 mg | ORAL_TABLET | Freq: Every day | ORAL | Status: DC
  Administered 2016-11-30: 5 mg via ORAL
  Filled 2016-11-30: qty 1

## 2016-11-30 MED ORDER — LEVOTHYROXINE SODIUM 50 MCG PO TABS
50.0000 ug | ORAL_TABLET | Freq: Every day | ORAL | Status: DC
  Administered 2016-11-30 – 2016-12-02 (×3): 50 ug via ORAL
  Filled 2016-11-30 (×2): qty 1

## 2016-11-30 MED ORDER — ORAL CARE MOUTH RINSE
15.0000 mL | Freq: Two times a day (BID) | OROMUCOSAL | Status: DC
  Administered 2016-12-01 – 2016-12-02 (×3): 15 mL via OROMUCOSAL

## 2016-11-30 MED ORDER — INSULIN ASPART 100 UNIT/ML ~~LOC~~ SOLN
0.0000 [IU] | Freq: Three times a day (TID) | SUBCUTANEOUS | Status: DC
  Administered 2016-11-30 (×3): 11 [IU] via SUBCUTANEOUS
  Filled 2016-11-30 (×2): qty 1

## 2016-11-30 MED ORDER — DIVALPROEX SODIUM ER 250 MG PO TB24: 500.0000 mg | ORAL_TABLET | Freq: Two times a day (BID) | ORAL | Status: DC

## 2016-11-30 MED ORDER — HEPARIN (PORCINE) IN NACL 100-0.45 UNIT/ML-% IJ SOLN
1500.0000 [IU]/h | INTRAMUSCULAR | Status: DC
  Administered 2016-11-30 (×2): 1500 [IU]/h via INTRAVENOUS
  Filled 2016-11-30: qty 250

## 2016-11-30 MED ORDER — PANTOPRAZOLE SODIUM 40 MG PO TBEC
40.0000 mg | DELAYED_RELEASE_TABLET | Freq: Every day | ORAL | Status: DC
  Administered 2016-11-30 – 2016-12-02 (×3): 40 mg via ORAL
  Filled 2016-11-30 (×2): qty 1

## 2016-11-30 MED ORDER — ONDANSETRON HCL 4 MG/2ML IJ SOLN: 4.0000 mg | Freq: Four times a day (QID) | INTRAMUSCULAR | Status: DC | PRN

## 2016-11-30 MED ORDER — NITROGLYCERIN 0.4 MG SL SUBL: 0.4000 mg | SUBLINGUAL_TABLET | SUBLINGUAL | Status: DC | PRN

## 2016-11-30 MED ORDER — FUROSEMIDE 10 MG/ML IJ SOLN
40.0000 mg | Freq: Once | INTRAMUSCULAR | Status: AC
  Administered 2016-11-30: 40 mg via INTRAVENOUS

## 2016-11-30 NOTE — ED Provider Notes (Signed)
[facility], [facility], [facility] Emergency Department Provider Note    First MD Initiated Contact with Patient 11/30/16 [phone]     (approximate)  I have reviewed the triage vital signs and the nursing notes.   HISTORY  Chief Complaint Chest Pain    HPI Dennis Mullen is a 66 y.o. male below list of chronic medical conditions presents emergency department with acute onset of dyspnea began at 11:00 PM last night.. Per EMS on their arrival the patient's oxygen saturation was 80% on room air. EMS applied a nonrebreather. EMS states that patient had apparent respiratory difficulty on arrival. Patient denies any chest pain. Patient denies any lower external pain or swelling. Patient denies any history of DVT or PE. Of note patient was recently discharged from hospital on 11/28/2016 status post cardiac catheterization.   Past Medical History:  Diagnosis Date  . Abnormal levels of other serum enzymes   . Anxiety   . Bipolar disorder ([place])   . BPH (benign prostatic hyperplasia)   . CAD (coronary artery disease)   . CHF (congestive heart failure) ([place])    ?  Marland Kitchen Depression   . Diabetes mellitus, type II ([place])   . Diabetes mellitus, type II, insulin dependent ([place])   . ED (erectile dysfunction)   . GERD (gastroesophageal reflux disease)   . Gout   . History of borderline personality disorder   . Hypertension   . Hypogonadism in male   . Hypothyroidism   . Insomnia   . Mood disorder ([place])   . Neuropathy   . Obesity   . OSA (obstructive sleep apnea)   . PVD (peripheral vascular disease) ([place])   . Thyroid disease     Patient Active Problem List   Diagnosis Date Noted  . Acute diastolic CHF (congestive heart failure) ([place]) 11/27/2016  . Acute CHF ([place]) 11/27/2016  . Lymphedema 03/30/2016  . Left leg cellulitis 03/12/2016  . Insulin overdose 02/02/2016  . Hypoglycemia   . OSA on CPAP   . Depression   . Hypophosphatemia   . SOB (shortness of breath) 10/11/2015  .  Elevated troponin 10/11/2015  . Chest pain at rest 09/24/2015  . Elevated PSA 07/26/2015  . Hypogonadism in male 07/26/2015  . 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 BPH with obstruction/lower urinary tract symptoms 06/29/2015  . Bipolar 1 disorder, mixed ([place]) 04/14/2015  . Borderline personality disorder 04/14/2015  . Acute renal failure ([place]) 11/15/2014  . Chest pain 11/14/2014  . Dizziness 11/05/2014  . Benign essential HTN 11/04/2014  . Combined fat and carbohydrate induced hyperlipemia 11/04/2014  . Bipolar 2 disorder ([place]) 10/27/2014  . Acquired hypothyroidism 02/03/2014  . Type 2 diabetes mellitus ([place]) 02/03/2014  . Adiposity 01/06/2014  . Obesity, diabetes, and hypertension syndrome ([place]) 12/23/2013  . Long term current use of insulin ([place]) 12/23/2013  . Type 2 diabetes mellitus with other diabetic neurological complication ([place]) 62/83/6629  . Disorder affecting the body's metabolism 12/23/2013  . Acute inflammation of the pancreas 11/18/2013  . Elevated cholesterol with elevated triglycerides 11/18/2013  . CAD (coronary artery disease), native coronary artery 09/26/2013  . Diabetes mellitus ([place]) 09/26/2013  . Acute non-ST elevation myocardial infarction (NSTEMI) ([place]) 09/26/2013  . Non-ST elevation (NSTEMI) myocardial infarction [facility]) 09/26/2013    Past Surgical History:  Procedure Laterality Date  . AMPUTATION TOE Left 08/25/2016   Procedure: AMPUTATION TOE/ipj left 2nd toe;  Surgeon: Sharlotte Alamo, DPM;  Location: [facility] ORS;  Service: Podiatry;  Laterality: Left;  .  CARDIAC CATHETERIZATION N/A 10/12/2015   Procedure: Left Heart Cath and Coronary Angiography;  Surgeon: Teodoro Spray, MD;  Location: [place] CV LAB;  Service: Cardiovascular;  Laterality: N/A;  . CORONARY ANGIOPLASTY WITH STENT PLACEMENT     x 6 stents  . FOOT SURGERY Right    fracture repair with screw  . LEFT HEART CATH AND CORONARY ANGIOGRAPHY Right 06/01/2016   Procedure: Left  Heart Cath and Coronary Angiography;  Surgeon: Isaias Cowman, MD;  Location: [place] CV LAB;  Service: Cardiovascular;  Laterality: Right;  . LEFT HEART CATH AND CORONARY ANGIOGRAPHY N/A 11/28/2016   Procedure: LEFT HEART CATH AND CORONARY ANGIOGRAPHY and possible pci;  Surgeon: Yolonda Kida, MD;  Location: [place] [facility];  Service: Cardiovascular;  Laterality: N/A;  . MASTOIDECTOMY Right   . NASAL SEPTUM SURGERY    . penial inplant    . PENILE PROSTHESIS IMPLANT    . right mastoidectomy    . SCROTOPLASTY    . TONSILLECTOMY AND ADENOIDECTOMY      Prior to Admission medications   Medication Sig Start Date End Date Taking? Authorizing Provider  allopurinol (ZYLOPRIM) 100 MG tablet Take 100 mg by mouth daily.     [provider]  amLODipine (NORVASC) 5 MG tablet Take 5 mg by mouth at bedtime.  10/14/14   [provider]  apixaban (ELIQUIS) 5 MG TABS tablet Take 1 tablet (5 mg total) by mouth 2 (two) times daily. 06/02/16   Hillary Bow, MD  clopidogrel (PLAVIX) 75 MG tablet Take 75 mg by mouth daily. Reported on 07/26/2015    [provider]  colchicine 0.6 MG tablet Take 0.6 mg by mouth 2 (two) times daily as needed (gout flare).  11/08/15   [provider]  Continuous Blood Gluc Sensor (FREESTYLE LIBRE SENSOR SYSTEM) MISC Use 1 each as directed. Please place prior to office visit as directed 06/20/16   [provider]  diclofenac (VOLTAREN) 75 MG EC tablet Take 75 mg by mouth daily as needed (gout flare).  12/09/15   [provider]  divalproex (DEPAKOTE ER) 500 MG 24 hr tablet Take 1-2 tablets (500-1,000 mg total) by mouth 2 (two) times daily. Take 582m in morning and 10029min evening 08/24/16   Clapacs, JoMadie RenoMD  DULoxetine (CYMBALTA) 60 MG capsule Take 1 capsule (60 mg total) by mouth daily. 08/24/16   Clapacs, JoMadie RenoMD  esomeprazole (NEXIUM) 40 MG capsule Take 40 mg by mouth daily.  10/14/14   [provider]  fenofibrate (TRICOR) 145 MG tablet Take 145 mg by mouth daily.  12/23/15 12/22/16  [provider]  furosemide (LASIX) 40 MG tablet Take 1 tablet (40 mg total) by mouth daily. Patient taking differently: Take 40 mg by mouth daily. Pt takes medication every other day 09/25/15   WiLoletha GrayerMD  gemfibrozil (LOPID) 600 MG tablet gemfibrozil 600 mg tablet    [provider]  glucose 4 GM chewable tablet Chew 4 g by mouth as needed for low blood sugar.     [provider]  Insulin Disposable Pump (V-GO 30) KIT Inject 1.2 Units/hr into the skin every hour. 66 units/24 hrs (36 available bolus units--12 bolus units available per meals) 10/07/14   [provider]  insulin lispro (HUMALOG KWIKPEN) 100 UNIT/ML KiwkPen Inject into the skin See admin instructions. Used ONLY as needed if additional bolus units are needed (up to 36 units/24 hrs.)    [provider]  isosorbide mononitrate (IMDUR) 60 MG 24 hr tablet Take 60 mg by mouth daily.  05/09/16 05/09/17  [provider]  JUBLIA 10 % SOLN Apply 1 application topically daily. 10/31/16   [provider]  levothyroxine (SYNTHROID, LEVOTHROID) 50 MCG tablet Take 50 mcg by mouth daily before breakfast.    [provider]  metFORMIN (GLUCOPHAGE) 1000 MG tablet Take 1,000 mg by mouth 2 (two) times daily with a meal. Morning & supper    [provider]  metoprolol succinate (TOPROL-XL) 50 MG 24 hr tablet Take 50 mg by mouth daily.  09/11/14   [provider]  nitroGLYCERIN (NITROSTAT) 0.4 MG SL tablet Place 0.4 mg under the tongue every 5 (five) minutes x 3 doses as needed for chest pain.     [provider]  pregabalin (LYRICA) 100 MG capsule Take 100 mg by mouth 3 (three) times daily before meals.     [provider]  rOPINIRole (REQUIP) 2 MG tablet Take 2 mg by mouth 2 (two) times daily.  04/27/15   [provider]  rosuvastatin (CRESTOR) 20 MG  tablet Take 20 mg by mouth at bedtime.     [provider]  VICTOZA 18 MG/3ML SOPN Inject 1.8 mg into the skin daily.  09/07/14   [provider]  vitamin C (ASCORBIC ACID) 500 MG tablet Take 500 mg by mouth daily.    [provider]    Allergies No known drug allergies  Family History  Problem Relation Age of Onset  . Lung cancer Mother   . Heart attack Father   . Post-traumatic stress disorder Father   . Anxiety disorder Father   . Depression Father   . Heart attack Sister   . Heart attack Brother   . Prostate cancer Neg Hx   . Kidney cancer Neg Hx     Social History Social History  Substance Use Topics  . Smoking status: Never Smoker  . Smokeless tobacco: Never Used  . Alcohol use No    Review of Systems Constitutional: No fever/chills Eyes: No visual changes. ENT: No sore throat. Cardiovascular: Denies chest pain. Respiratory: Positive for shortness of breath. Gastrointestinal: No abdominal pain.  No nausea, no vomiting.  No diarrhea.  No constipation. Genitourinary: Negative for dysuria. Musculoskeletal: Negative for neck pain.  Negative for back pain. Integumentary: Negative for rash. Neurological: Negative for headaches, focal weakness or numbness.   ____________________________________________   PHYSICAL EXAM:  VITAL SIGNS: ED Triage Vitals  Enc Vitals Group     BP 11/30/16 0420 138/63     Pulse Rate 11/30/16 0420 77     Resp 11/30/16 0420 19     Temp 11/30/16 0420 98.1 F (36.7 C)     Temp Source 11/30/16 0420 Oral     SpO2 11/30/16 0414 98 %     Weight 11/30/16 0421 129.3 kg (285 lb)     Height 11/30/16 0421 1.88 m (6' 2" )     Head Circumference --      Peak Flow --      Pain Score --      Pain Loc --      Pain Edu? --      Excl. in [place]? --     Constitutional: Alert and oriented. Apparent respiratory distress Eyes: Conjunctivae are normal.  Head: Atraumatic. Mouth/Throat: Mucous membranes are moist.  Oropharynx  non-erythematous. Neck: No stridor.   Cardiovascular: Normal rate, regular rhythm. Good peripheral circulation. Grossly normal heart sounds. Respiratory:  Tachypnea, positive accessory respiratory muscle use.  No retractions.Bibasilar rales Gastrointestinal: Soft and nontender. No distention.  Musculoskeletal: No lower extremity tenderness nor edema. No gross deformities of extremities. Neurologic:  Normal speech and language. No gross focal neurologic deficits are appreciated.  Skin:  Skin is warm, dry and intact. No rash noted. Psychiatric: Mood and affect are normal. Speech and behavior are normal.  ____________________________________________   LABS (all labs ordered are listed, but only abnormal results are displayed)  Labs Reviewed  BASIC METABOLIC PANEL - Abnormal; Notable for the following:       Result Value   Potassium 5.4 (*)    Glucose, Bld 408 (*)    BUN 34 (*)    Creatinine, Ser 1.49 (*)    Calcium 8.8 (*)    GFR calc non Af Amer 48 (*)    GFR calc Af Amer 55 (*)    All other components within normal limits  CBC - Abnormal; Notable for the following:    RBC 3.94 (*)    Hemoglobin 11.2 (*)    HCT 33.7 (*)    RDW 18.9 (*)    All other components within normal limits  TROPONIN I - Abnormal; Notable for the following:    Troponin I 0.64 (*)    All other components within normal limits  GLUCOSE, CAPILLARY - Abnormal; Notable for the following:    Glucose-Capillary 432 (*)    All other components within normal limits   ____________________________________________  EKG  ED ECG REPORT I, [place] N Valentine Barney, the attending physician, personally viewed and interpreted this ECG.   Date: 11/30/2016  EKG Time: 4:15 AM  Rate: 82  Rhythm: Normal sinus rhythm  Axis: Normal  Intervals: Normal  ST&T Change: None  ____________________________________________  RADIOLOGY I, [place] N Dolorez Jeffrey, personally viewed and evaluated these images (plain radiographs) as part of my  medical decision making, as well as reviewing the written report by the radiologist.  Dg Chest Port 1 View  Result Date: 11/30/2016 CLINICAL DATA:  Acute onset of shortness of breath and generalized chest pressure. Initial encounter. EXAM: PORTABLE CHEST 1 VIEW COMPARISON:  Chest radiograph performed 11/28/2016 FINDINGS: The lungs are well-aerated. Vascular congestion is noted. Increased interstitial markings raise concern for mild interstitial edema. There is no evidence of pleural effusion or pneumothorax. The cardiomediastinal silhouette is borderline normal in size. No acute osseous abnormalities are seen. IMPRESSION: Vascular congestion. Increased interstitial markings raise concern for mild interstitial edema. Electronically Signed   By: Garald Balding M.D.   On: 11/30/2016 05:00    ____________________________________________   PROCEDURES  Critical Care performed: CRITICAL CARE Performed by: Gregor Hams   Total critical care time: 45 minutes  Critical care time was exclusive of separately billable procedures and treating other patients.  Critical care was necessary to treat or prevent imminent or life-threatening deterioration.  Critical care was time spent personally by me on the following activities: development of treatment plan with patient and/or surrogate as well as nursing, discussions with consultants, evaluation of patient's response to treatment, examination of patient, obtaining history from patient or surrogate, ordering and performing treatments and interventions, ordering and review of laboratory studies, ordering and review of radiographic studies, pulse oximetry and re-evaluation of patient's condition.   Procedures   ____________________________________________   INITIAL IMPRESSION / ASSESSMENT AND PLAN / ED COURSE  Pertinent labs & imaging results that were available during my care of the patient were reviewed by me and considered in my medical decision  making (see chart for details).  66 year old male presenting the emergency department with acute onset of dyspnea at 11 PM last status progressively worsened. Patient with apparent respiratory distress on arrival to the emergency department nonrebreather removed nasal cannula 4 L applied patient's oxygen saturation 96%. Chest x-ray revealed "mild interstitial edema" however patient respiratory status worsen giving concern for possibly pulmonary emboli or worsening pulmonary edema CT scan of the chest performed. In addition patient's EKG revealed no evidence of acute infarction however troponin elevated at 0.64 patient's troponin before discharge was 0.22. Patient discussed with Dr. Josephina Shih admission for further evaluation and management  ----------------------------------------- 6:16 AM on 11/30/2016 -----------------------------------------    At 6:00 AM I was notified of the patient's respiratory status acutely worsened at which time I presented to the room immediately and noted diffuse rales. I requested a BiPAP applied to the patient immediately as well as Lasix 40 mg to be administered. Suspecting pulmonary edema  ____________________________________________  FINAL CLINICAL IMPRESSION(S) / ED DIAGNOSES  Final diagnoses:  Acute pulmonary edema (HCC)  Respiratory distress     MEDICATIONS GIVEN DURING THIS VISIT:  Medications  furosemide (LASIX) injection 40 mg (not administered)  LORazepam (ATIVAN) injection 1 mg (1 mg Intravenous Given 11/30/16 0431)  aspirin chewable tablet 324 mg (324 mg Oral Given 11/30/16 0526)  iopamidol (ISOVUE-370) 76 % injection 75 mL (75 mLs Intravenous Contrast Given 11/30/16 0536)     NEW OUTPATIENT MEDICATIONS STARTED DURING THIS VISIT:  New Prescriptions   No medications on file    Modified Medications   No medications on file    Discontinued Medications   No medications on file     Note:  This document was prepared using Dragon  voice recognition software and may include unintentional dictation errors.    Gregor Hams, MD 11/30/16 [phone]

## 2016-11-30 NOTE — Progress Notes (Addendum)
Inpatient Diabetes Program Recommendations  AACE/ADA: New Consensus Statement on Inpatient Glycemic Control (2015)  Target Ranges:  Prepandial:   less than 140 mg/dL      Peak postprandial:   less than 180 mg/dL (1-2 hours)      Critically ill patients:  140 - 180 mg/dL   Lab Results  Component Value Date   GLUCAP 382 (H) 11/30/2016   HGBA1C 7.5 (H) 05/31/2016    Review of Glycemic Control  Results for GARRETTE, CAINE (MRN [id number]) as of 11/30/2016 08:44  Ref. Range 11/30/2016 04:46 11/30/2016 06:17  Glucose-Capillary Latest Ref Range: 65 - 99 mg/dL 432 (H) 382 (H)    Diabetes history: Type 2  Outpatient Diabetes medications: V-go 30 with Humolog insulin (30 units of basal and 12 units Humalog at meals), Metformin 1000mg  bid, Victoza 1.8mg  Current orders for Inpatient glycemic control: Levemir 26 units qday, Novolog 0-20 units tid  MD- Patient saw his Endocrinologist (Dr. Adella Hare with [facility]) on 11/07/16.  At that visit, patient was instructed to continue his current diabetes regimen at home.   Inpatient Diabetes Program Recommendations: Consider increasing Levemir to 30 units qday, Novolog 6 units tid (hold if patient eats less than 50%),  and decrease Novolog correction to 0-15 units tid)  Text page to MD Gouru  Gentry Fitz, RN, BA, MHA, CDE Diabetes Coordinator Inpatient Diabetes Program  [phone] (Team Pager) [phone] ([place]) 11/30/2016 9:19 AM

## 2016-11-30 NOTE — Progress Notes (Signed)
ANTICOAGULATION CONSULT NOTE - Initial Consult  Pharmacy Consult for heparin drip management Indication: chest pain/ACS  No Known Allergies  Patient Measurements: Height: 6\' 2"  (188 cm) Weight: 276 lb 7.3 oz (125.4 kg) IBW/kg (Calculated) : 82.2 Heparin Dosing Weight: 109kg   Vital Signs: Temp: 99.2 F (37.3 C) (08/16 0809) Temp Source: Axillary (08/16 0809) BP: 127/49 (08/16 1800) Pulse Rate: 64 (08/16 1800)  Labs:  Recent Labs  11/28/16 0624 11/30/16 0415 11/30/16 0841 11/30/16 1012 11/30/16 1356 11/30/16 1708  HGB 11.3* 11.2*  --   --   --   --   HCT 34.8* 33.7*  --   --   --   --   PLT 272 290  --   --   --   --   APTT  --   --   --  32  --   --   LABPROT  --   --   --  14.4  --   --   INR  --   --   --  1.11  --   --   HEPARINUNFRC 0.39  --   --  <0.10*  --  0.44  CREATININE 1.17 1.49*  --   --   --   --   TROPONINI  --  0.64* 1.64*  --  1.60*  --     Estimated Creatinine Clearance: 70.5 mL/min (A) (by C-G formula based on SCr of 1.49 mg/dL (H)).   Medical History: Past Medical History:  Diagnosis Date  . Abnormal levels of other serum enzymes   . Anxiety   . Bipolar disorder ([place])   . BPH (benign prostatic hyperplasia)   . CAD (coronary artery disease)   . CHF (congestive heart failure) ([place])    ?  Marland Kitchen Depression   . Diabetes mellitus, type II ([place])   . Diabetes mellitus, type II, insulin dependent ([place])   . ED (erectile dysfunction)   . GERD (gastroesophageal reflux disease)   . Gout   . History of borderline personality disorder   . Hypertension   . Hypogonadism in male   . Hypothyroidism   . Insomnia   . Mood disorder ([place])   . Neuropathy   . Obesity   . OSA (obstructive sleep apnea)   . PVD (peripheral vascular disease) ([place])   . Thyroid disease     Medications:  Scheduled:  . allopurinol  100 mg Oral Daily  . amLODipine  5 mg Oral QHS  . [START ON 12/01/2016] aspirin EC  81 mg Oral Daily  . clopidogrel  75 mg Oral Daily  .  divalproex  500 mg Oral Daily   And  . divalproex  1,000 mg Oral QHS  . DULoxetine  60 mg Oral Daily  . fenofibrate  54 mg Oral Daily  . furosemide  40 mg Intravenous Q12H  . gemfibrozil  600 mg Oral BID AC  . insulin aspart  0-20 Units Subcutaneous TID WC  . insulin aspart  6 Units Subcutaneous TID WC  . [START ON 12/01/2016] insulin detemir  30 Units Subcutaneous Daily  . isosorbide mononitrate  60 mg Oral Daily  . levothyroxine  50 mcg Oral QAC breakfast  . mouth rinse  15 mL Mouth Rinse BID  . metoprolol succinate  50 mg Oral Daily  . pantoprazole  40 mg Oral Daily  . pregabalin  100 mg Oral TID AC  . rOPINIRole  2 mg Oral BID  . rosuvastatin  20 mg Oral  QHS  . sodium chloride flush  3 mL Intravenous Q12H  . vitamin C  500 mg Oral Daily   Infusions:  . sodium chloride    . heparin 1,500 Units/hr (11/30/16 1030)    Assessment: Pharmacy consulted for heparin drip management for 66 yo male admitted with NSTEMI.   Goal of Therapy:  Heparin level 0.3-0.7 units/ml Monitor platelets by anticoagulation protocol: Yes   Plan:  Continue heparin drip at current rate and will check a confirmatory level in 6 hours.    Ulice Dash D 11/30/2016,6:48 PM

## 2016-11-30 NOTE — ED Notes (Signed)
After coming back from CT, pt feeling "pressure" from breathing difficulties, pt was reassessed and coarse crackles were noted through all lung fields; MD notified and orders obtained; pt was placed on Bi-Pap and given 40mg  Lasix.

## 2016-11-30 NOTE — Consult Note (Signed)
[facility] Consultation     Assessment  Acute pulmonary edema with acute hypoxic respiratory failure.  OSA.  Diabetes mellitus with hyperglycemia. Elevated troponins.  Plan -continue diuresis. -wean down/off BiPAP. -Cardiology following. Appreciate input. -TrendTroponins. -Continue insulin pump.       Date: 11/30/2016  MRN# [id number] Dennis Mullen 12-03-1950  Referring Physician:   JAFET WISSING is a 67 y.o. old male seen in consultation for chief complaint of:    Chief Complaint  Patient presents with  . Chest Pain    HPI:   The patient is a 66 year old male with a history of bipolar disorder, coronary artery disease, congestive heart failure, diabetes mellitus, sleep apnea, peripheral vascular disease. He presented to the emergency room with shortness of breath over the last 1-2 days associated with some chest pressure and dyspnea. He was initially put on nonrebreather mask. After it was noted that his oxygen saturation was 80% on room air.he was then switched to a BiPAP with improvement in his respiratory status, and transferred to the intensive care unit.  I personally reviewed CT images; bilateral pulm edema, with cardiomegaly.    PMHX:   Past Medical History:  Diagnosis Date  . Abnormal levels of other serum enzymes   . Anxiety   . Bipolar disorder ([place])   . BPH (benign prostatic hyperplasia)   . CAD (coronary artery disease)   . CHF (congestive heart failure) ([place])    ?  Marland Kitchen Depression   . Diabetes mellitus, type II ([place])   . Diabetes mellitus, type II, insulin dependent ([place])   . ED (erectile dysfunction)   . GERD (gastroesophageal reflux disease)   . Gout   . History of borderline personality disorder   . Hypertension   . Hypogonadism in male   . Hypothyroidism   . Insomnia   . Mood disorder ([place])   . Neuropathy   . Obesity   . OSA (obstructive sleep apnea)   . PVD (peripheral vascular disease) ([place])   . Thyroid  disease    Surgical Hx:  Past Surgical History:  Procedure Laterality Date  . AMPUTATION TOE Left 08/25/2016   Procedure: AMPUTATION TOE/ipj left 2nd toe;  Surgeon: Sharlotte Alamo, DPM;  Location: [facility];  Service: Podiatry;  Laterality: Left;  . CARDIAC CATHETERIZATION N/A 10/12/2015   Procedure: Left Heart Cath and Coronary Angiography;  Surgeon: Teodoro Spray, MD;  Location: [facility];  Service: Cardiovascular;  Laterality: N/A;  . CORONARY ANGIOPLASTY WITH STENT PLACEMENT     x 6 stents  . FOOT SURGERY Right    fracture repair with screw  . LEFT HEART CATH AND CORONARY ANGIOGRAPHY Right 06/01/2016   Procedure: Left Heart Cath and Coronary Angiography;  Surgeon: Isaias Cowman, MD;  Location: [facility];  Service: Cardiovascular;  Laterality: Right;  . LEFT HEART CATH AND CORONARY ANGIOGRAPHY N/A 11/28/2016   Procedure: LEFT HEART CATH AND CORONARY ANGIOGRAPHY and possible pci;  Surgeon: Yolonda Kida, MD;  Location: [facility];  Service: Cardiovascular;  Laterality: N/A;  . MASTOIDECTOMY Right   . NASAL SEPTUM SURGERY    . penial inplant    . PENILE PROSTHESIS IMPLANT    . right mastoidectomy    . SCROTOPLASTY    . TONSILLECTOMY AND ADENOIDECTOMY     Family Hx:  Family History  Problem Relation Age of Onset  . Lung cancer Mother   . Heart attack Father   . Post-traumatic stress disorder  Father   . Anxiety disorder Father   . Depression Father   . Heart attack Sister   . Heart attack Brother   . Prostate cancer Neg Hx   . Kidney cancer Neg Hx    Social Hx:   Social History  Substance Use Topics  . Smoking status: Never Smoker  . Smokeless tobacco: Never Used  . Alcohol use No   Medication:    Current Facility-Administered Medications:  .  0.9 %  sodium chloride infusion, 250 mL, Intravenous, PRN, Pyreddy, Pavan, MD .  acetaminophen (TYLENOL) tablet 650 mg, 650 mg, Oral, Q4H PRN, Pyreddy, Pavan, MD .  allopurinol (ZYLOPRIM)  tablet 100 mg, 100 mg, Oral, Daily, Pyreddy, Pavan, MD .  amLODipine (NORVASC) tablet 5 mg, 5 mg, Oral, QHS, Pyreddy, Pavan, MD .  aspirin chewable tablet 324 mg, 324 mg, Oral, NOW **OR** aspirin suppository 300 mg, 300 mg, Rectal, NOW, Pyreddy, Pavan, MD .  Derrill Memo ON 12/01/2016] aspirin EC tablet 81 mg, 81 mg, Oral, Daily, Pyreddy, Pavan, MD .  clopidogrel (PLAVIX) tablet 75 mg, 75 mg, Oral, Daily, Pyreddy, Pavan, MD .  divalproex (DEPAKOTE) DR tablet 500 mg, 500 mg, Oral, Daily **AND** divalproex (DEPAKOTE) DR tablet 1,000 mg, 1,000 mg, Oral, QHS, Pyreddy, Pavan, MD .  DULoxetine (CYMBALTA) DR capsule 60 mg, 60 mg, Oral, Daily, Pyreddy, Pavan, MD .  fenofibrate tablet 54 mg, 54 mg, Oral, Daily, Pyreddy, Pavan, MD .  furosemide (LASIX) injection 40 mg, 40 mg, Intravenous, Q12H, Pyreddy, Pavan, MD .  gemfibrozil (LOPID) tablet 600 mg, 600 mg, Oral, BID AC, Pyreddy, Pavan, MD .  isosorbide mononitrate (IMDUR) 24 hr tablet 60 mg, 60 mg, Oral, Daily, Pyreddy, Pavan, MD .  levothyroxine (SYNTHROID, LEVOTHROID) tablet 50 mcg, 50 mcg, Oral, QAC breakfast, Pyreddy, Pavan, MD .  metoprolol succinate (TOPROL-XL) 24 hr tablet 50 mg, 50 mg, Oral, Daily, Pyreddy, Pavan, MD .  nitroGLYCERIN (NITROSTAT) SL tablet 0.4 mg, 0.4 mg, Sublingual, Q5 Min x 3 PRN, Pyreddy, Pavan, MD .  ondansetron (ZOFRAN) injection 4 mg, 4 mg, Intravenous, Q6H PRN, Pyreddy, Pavan, MD .  pantoprazole (PROTONIX) EC tablet 40 mg, 40 mg, Oral, Daily, Pyreddy, Pavan, MD .  pregabalin (LYRICA) capsule 100 mg, 100 mg, Oral, TID AC, Pyreddy, Pavan, MD .  rOPINIRole (REQUIP) tablet 2 mg, 2 mg, Oral, BID, Pyreddy, Pavan, MD .  rosuvastatin (CRESTOR) tablet 20 mg, 20 mg, Oral, QHS, Pyreddy, Pavan, MD .  sodium chloride flush (NS) 0.9 % injection 3 mL, 3 mL, Intravenous, Q12H, Pyreddy, Pavan, MD .  sodium chloride flush (NS) 0.9 % injection 3 mL, 3 mL, Intravenous, PRN, Pyreddy, Pavan, MD .  vitamin C (ASCORBIC ACID) tablet 500 mg, 500 mg, Oral,  Daily, Pyreddy, Pavan, MD   Allergies:  Patient has no known allergies.  Review of Systems: Gen:  Denies  fever, sweats, chills HEENT: Denies blurred vision, double vision. bleeds, sore throat Cvc:  No dizziness, chest pain. Resp:   Denies cough or sputum production, shortness of breath Gi: Denies swallowing difficulty, stomach pain. Gu:  Denies bladder incontinence, burning urine Ext:   No Joint pain, stiffness. Skin: No skin rash,  hives  Endoc:  No polyuria, polydipsia. Psych: No depression, insomnia. Other:  All other systems were reviewed with the patient and were negative other that what is mentioned in the HPI.   Physical Examination:   VS: BP (!) 147/64 (BP Location: Left Arm)   Pulse 75   Temp 99.2 F (37.3 C) (Axillary)  Resp 18   Ht 6\' 2"  (1.88 m)   Wt 276 lb 7.3 oz (125.4 kg)   SpO2 95%   BMI 35.49 kg/m   General Appearance: No distress  Neuro:without focal findings,  speech normal,  HEENT: PERRLA, EOM intact.   Pulmonary: normal breath sounds, No wheezing.  CardiovascularNormal S1,S2.  No m/r/g.   Abdomen: Benign, Soft, non-tender. Renal:  No costovertebral tenderness  GU:  No performed at this time. Endoc: No evident thyromegaly, no signs of acromegaly. Skin:   warm, no rashes, no ecchymosis  Extremities: normal, no cyanosis, clubbing.  Other findings:    LABORATORY PANEL:   CBC  Recent Labs Lab 11/30/16 0415  WBC 8.9  HGB 11.2*  HCT 33.7*  PLT 290   ------------------------------------------------------------------------------------------------------------------  Chemistries   Recent Labs Lab 11/30/16 0415  NA 138  K 5.4*  CL 106  CO2 24  GLUCOSE 408*  BUN 34*  CREATININE 1.49*  CALCIUM 8.8*   ------------------------------------------------------------------------------------------------------------------  Cardiac Enzymes  Recent Labs Lab 11/30/16 0415  TROPONINI 0.64*    ------------------------------------------------------------  RADIOLOGY:  Ct Angio Chest Pe W And/or Wo Contrast  Result Date: 11/30/2016 CLINICAL DATA:  Acute onset of shortness of breath and generalized chest pressure. Initial encounter. EXAM: CT ANGIOGRAPHY CHEST WITH CONTRAST TECHNIQUE: Multidetector CT imaging of the chest was performed using the standard protocol during bolus administration of intravenous contrast. Multiplanar CT image reconstructions and MIPs were obtained to evaluate the vascular anatomy. CONTRAST:  75 mL of Isovue 370 IV contrast COMPARISON:  Chest radiograph performed earlier today at 4:27 a.m., and CTA of the chest performed 10/10/2014 FINDINGS: Cardiovascular: There is no evidence of significant pulmonary embolus. Evaluation for pulmonary embolus is somewhat suboptimal due to motion artifact. The heart remains normal in size. Diffuse coronary artery calcifications are seen. Scattered calcification is seen along the aortic arch. The proximal great vessels are grossly unremarkable in appearance. Mediastinum/Nodes: Mildly enlarged right paratracheal and superior mediastinal nodes measure up to 1.2 cm in short axis. No pericardial effusion is identified. The thyroid gland is unremarkable. No axillary lymphadenopathy is seen. Lungs/Pleura: Diffuse interstitial prominence is noted, with hazy bilateral airspace opacities, compatible with pulmonary edema. No pleural effusion or pneumothorax is seen. No dominant mass is identified. Upper Abdomen: The visualized portions of the liver and spleen are unremarkable, aside from an apparent calcified granuloma within the spleen. The visualized portions of the gallbladder, pancreas, adrenal glands and stomach are within normal limits. Musculoskeletal: No acute osseous abnormalities are identified. The visualized musculature is unremarkable in appearance. Review of the MIP images confirms the above findings. IMPRESSION: 1. No evidence of  significant pulmonary embolus. 2. Diffuse interstitial prominence and hazy bilateral airspace opacities are compatible with pulmonary edema. 3. Diffuse coronary artery calcifications seen. 4. Mildly enlarged mediastinal nodes measure up to 1.2 cm in short axis. Electronically Signed   By: Garald Balding M.D.   On: 11/30/2016 06:19   Dg Chest Port 1 View  Result Date: 11/30/2016 CLINICAL DATA:  Acute onset of shortness of breath and generalized chest pressure. Initial encounter. EXAM: PORTABLE CHEST 1 VIEW COMPARISON:  Chest radiograph performed 11/28/2016 FINDINGS: The lungs are well-aerated. Vascular congestion is noted. Increased interstitial markings raise concern for mild interstitial edema. There is no evidence of pleural effusion or pneumothorax. The cardiomediastinal silhouette is borderline normal in size. No acute osseous abnormalities are seen. IMPRESSION: Vascular congestion. Increased interstitial markings raise concern for mild interstitial edema. Electronically Signed   By: Garald Balding  M.D.   On: 11/30/2016 05:00       Thank  you for the consultation and for allowing [facility] to assist in the care of your patient. Our recommendations are noted above.  Please contact us if we can be of further service.   Marda Stalker, MD.  Board Certified in Internal Medicine, Pulmonary Medicine, [place], and Sleep Medicine.  [facility] Office Number: [phone]  Patricia Pesa, M.D.  Merton Border, M.D  11/30/2016

## 2016-11-30 NOTE — Progress Notes (Signed)
[facility] [place] NAME: Dennis Mullen    MR#:  [id number]  DATE OF BIRTH:  October 05, 1950  SUBJECTIVE:  CHIEF COMPLAINT:  Patient's shortness of breath significantly improved, off BiPAP, feeling better denies any chest pain  REVIEW OF SYSTEMS:  CONSTITUTIONAL: No fever, fatigue or weakness.  EYES: No blurred or double vision.  EARS, NOSE, AND THROAT: No tinnitus or ear pain.  RESPIRATORY: No cough, Improved shortness of breath, denies wheezing or hemoptysis.  CARDIOVASCULAR: No chest pain, orthopnea, edema.  GASTROINTESTINAL: No nausea, vomiting, diarrhea or abdominal pain.  GENITOURINARY: No dysuria, hematuria.  ENDOCRINE: No polyuria, nocturia,  HEMATOLOGY: No anemia, easy bruising or bleeding SKIN: No rash or lesion. MUSCULOSKELETAL: No joint pain or arthritis.   NEUROLOGIC: No tingling, numbness, weakness.  PSYCHIATRY: No anxiety or depression.   DRUG ALLERGIES:  No Known Allergies  VITALS:  Blood pressure 119/60, pulse 66, temperature 99.2 F (37.3 C), temperature source Axillary, resp. rate (!) 24, height 6\' 2"  (1.88 m), weight 125.4 kg (276 lb 7.3 oz), SpO2 98 %.  PHYSICAL EXAMINATION:  GENERAL:  66 y.o.-year-old patient lying in the bed with no acute distress.  EYES: Pupils equal, round, reactive to light and accommodation. No scleral icterus. Extraocular muscles intact.  HEENT: Head atraumatic, normocephalic. Oropharynx and nasopharynx clear.  NECK:  Supple, no jugular venous distention. No thyroid enlargement, no tenderness.  LUNGS: mod breath sounds bilaterally, no wheezing, rales,rhonchi or crepitation. No use of accessory muscles of respiration.  CARDIOVASCULAR: S1, S2 normal. No murmurs, rubs, or gallops.  ABDOMEN: Soft, nontender, nondistended. Bowel sounds present. No organomegaly or mass.  EXTREMITIES: No pedal edema, cyanosis, or clubbing.  NEUROLOGIC: Cranial nerves II through XII are intact. Muscle strength  at baseline in all extremities. Sensation intact. Gait not checked.  PSYCHIATRIC: The patient is alert and oriented x 3.  SKIN: No obvious rash, lesion, or ulcer.    LABORATORY PANEL:   CBC  Recent Labs Lab 11/30/16 0415  WBC 8.9  HGB 11.2*  HCT 33.7*  PLT 290   ------------------------------------------------------------------------------------------------------------------  Chemistries   Recent Labs Lab 11/30/16 0415  NA 138  K 5.4*  CL 106  CO2 24  GLUCOSE 408*  BUN 34*  CREATININE 1.49*  CALCIUM 8.8*   ------------------------------------------------------------------------------------------------------------------  Cardiac Enzymes  Recent Labs Lab 11/30/16 1356  TROPONINI 1.60*   ------------------------------------------------------------------------------------------------------------------  RADIOLOGY:  Ct Angio Chest Pe W And/or Wo Contrast  Result Date: 11/30/2016 CLINICAL DATA:  Acute onset of shortness of breath and generalized chest pressure. Initial encounter. EXAM: CT ANGIOGRAPHY CHEST WITH CONTRAST TECHNIQUE: Multidetector CT imaging of the chest was performed using the standard protocol during bolus administration of intravenous contrast. Multiplanar CT image reconstructions and MIPs were obtained to evaluate the vascular anatomy. CONTRAST:  75 mL of Isovue 370 IV contrast COMPARISON:  Chest radiograph performed earlier today at 4:27 a.m., and CTA of the chest performed 10/10/2014 FINDINGS: Cardiovascular: There is no evidence of significant pulmonary embolus. Evaluation for pulmonary embolus is somewhat suboptimal due to motion artifact. The heart remains normal in size. Diffuse coronary artery calcifications are seen. Scattered calcification is seen along the aortic arch. The proximal great vessels are grossly unremarkable in appearance. Mediastinum/Nodes: Mildly enlarged right paratracheal and superior mediastinal nodes measure up to 1.2 cm in short  axis. No pericardial effusion is identified. The thyroid gland is unremarkable. No axillary lymphadenopathy is seen. Lungs/Pleura: Diffuse interstitial prominence is noted, with hazy bilateral airspace opacities, compatible  with pulmonary edema. No pleural effusion or pneumothorax is seen. No dominant mass is identified. Upper Abdomen: The visualized portions of the liver and spleen are unremarkable, aside from an apparent calcified granuloma within the spleen. The visualized portions of the gallbladder, pancreas, adrenal glands and stomach are within normal limits. Musculoskeletal: No acute osseous abnormalities are identified. The visualized musculature is unremarkable in appearance. Review of the MIP images confirms the above findings. IMPRESSION: 1. No evidence of significant pulmonary embolus. 2. Diffuse interstitial prominence and hazy bilateral airspace opacities are compatible with pulmonary edema. 3. Diffuse coronary artery calcifications seen. 4. Mildly enlarged mediastinal nodes measure up to 1.2 cm in short axis. Electronically Signed   By: Garald Balding M.D.   On: 11/30/2016 06:19   Dg Chest Port 1 View  Result Date: 11/30/2016 CLINICAL DATA:  Acute onset of shortness of breath and generalized chest pressure. Initial encounter. EXAM: PORTABLE CHEST 1 VIEW COMPARISON:  Chest radiograph performed 11/28/2016 FINDINGS: The lungs are well-aerated. Vascular congestion is noted. Increased interstitial markings raise concern for mild interstitial edema. There is no evidence of pleural effusion or pneumothorax. The cardiomediastinal silhouette is borderline normal in size. No acute osseous abnormalities are seen. IMPRESSION: Vascular congestion. Increased interstitial markings raise concern for mild interstitial edema. Electronically Signed   By: Garald Balding M.D.   On: 11/30/2016 05:00    EKG:   Orders placed or performed during the hospital encounter of 11/30/16  . ED EKG  . ED EKG  . ED EKG  within 10 minutes  . ED EKG within 10 minutes  . EKG 12-lead    ASSESSMENT AND PLAN:     1. Acute hypoxic respiratory failure secondary to Acute pulmonary edema from acute on chronic diastolic congestive heart failure Clinically improving, off BiPAP Continue Lasix Monitor intake and output Continue aspirin, Toprol, Imdur, Lasix, statin   2. Elevated troponin from demand ischemia with history of coronary artery disease, multiple stents Troponin 0.64-1.64, with recent cardiac catheterization 3 days ago revealing mild-to-moderate coronary artery disease with mid LAD to distal LAD lesion 60% stenosed Discontinue IV heparin Medical management for now Follow-up with cardiology Continue aspirin, Plavix, Toprol, Lopid, Imdur, statin  3. Diabetes mellitus2  levemir, sliding scale insulin  4. Moderate aortic stenosis-follow-up with cardiology  5. Essential hypertension  continue amlodipine, Lasix, Toprol, Imdur  6. Hyperkalemia patient is on Lasix which should resolve the hyperkalemia will repeat potassium and if necessary will give Kayexalate   All the records are reviewed and case discussed with Care Management/Social Workerr. Management plans discussed with the patient, family and they are in agreement.  CODE STATUS: fc   TOTAL TIME TAKING CARE OF THIS PATIENT: 36  minutes.   POSSIBLE D/C IN 3 DAYS, DEPENDING ON CLINICAL CONDITION.  Note: This dictation was prepared with Dragon dictation along with smaller phrase technology. Any transcriptional errors that result from this process are unintentional.   Nicholes Mango M.D on 11/30/2016 at 3:37 PM  Between 7am to 6pm - Pager - [phone] After 6pm go to [email or web address] - password [email or web address] [facility]  [facility]  Office  [phone]  CC: Primary care physician; Lavera Guise, MD

## 2016-11-30 NOTE — Care Management (Signed)
Fourth presentation in last 6 months. This RNCM attempted to meet with patient to discuss discharge planning but he was sleeping. He has not met criteria for home health services in the past. He has been referred to outpatient cardiac rehab it appears. RNCM team should continue to follow.

## 2016-11-30 NOTE — Consult Note (Signed)
[facility] Cardiology  CARDIOLOGY CONSULT NOTE  Patient ID: Dennis Mullen MRN: [id number] DOB/AGE: 07/20/1950 66 y.o.  Admit date: 11/30/2016 Referring Physician Pyreddy Primary Physician Lavera Guise, MD Primary Cardiologist [name] Reason for Consultation Pulmonary edema  HPI: 66 year old male referred for evaluation of pulmonary edema. The patient has a history of coronary artery disease, status post multiple stents, with recent cardiac catheterization on 11/28/2016, revealing mild to moderate CAD without significant change from cardiac catheterization in 05/2016, moderate aortic stenosis, paroxysmal atrial fibrillation on Eliquis, hypertension, type 2 diabetes, and OSA. The patient was just discharged 2 days ago for acute on chronic diastolic CHF, non-STEMI for which he underwent cardiac catheterization as previously mentioned, and acute on chronic renal failure. Upon discharge, his troponin was borderline elevated at 0.22. The patient reports acute onset of dyspnea while walking through the grocery around 11 PM last night. He had to stop multiple times to catch his breath. He also had mild chest pressure, for which he took 2 nitroglycerin an hour apart with resolution of chest pain. The patient called EMS when he arrived back home, and was noted to be in respiratory distress. He responded initially to nonrebreather mask. Once in [facility] ER, the patient became more acutely short of breath and was put on BiPAP and received IV Lasix. CT angiogram of the chest revealed pulmonary edema without evidence of pulmonary embolus. Admission labs notable for borderline elevated troponin of 0.64, followed by 1.64, hyperkalemia of 5.4, creatinine 1.49, BUN 34. 2D echocardiogram on 11/27/16 revealed normal left ventricular function with LVEF 60-65%, with mild to moderate aortic valve stenosis with a valve area of 0.96 cm. Currently, the patient reports feeling better. He denies shortness of breath on supplemental oxygen  via nasal cannula. He denies recurrence of chest pain.  Review of systems complete and found to be negative unless listed above     Past Medical History:  Diagnosis Date  . Abnormal levels of other serum enzymes   . Anxiety   . Bipolar disorder ([place])   . BPH (benign prostatic hyperplasia)   . CAD (coronary artery disease)   . CHF (congestive heart failure) ([place])    ?  Marland Kitchen Depression   . Diabetes mellitus, type II ([place])   . Diabetes mellitus, type II, insulin dependent ([place])   . ED (erectile dysfunction)   . GERD (gastroesophageal reflux disease)   . Gout   . History of borderline personality disorder   . Hypertension   . Hypogonadism in male   . Hypothyroidism   . Insomnia   . Mood disorder ([place])   . Neuropathy   . Obesity   . OSA (obstructive sleep apnea)   . PVD (peripheral vascular disease) ([place])   . Thyroid disease     Past Surgical History:  Procedure Laterality Date  . AMPUTATION TOE Left 08/25/2016   Procedure: AMPUTATION TOE/ipj left 2nd toe;  Surgeon: Sharlotte Alamo, DPM;  Location: [facility] ORS;  Service: Podiatry;  Laterality: Left;  . CARDIAC CATHETERIZATION N/A 10/12/2015   Procedure: Left Heart Cath and Coronary Angiography;  Surgeon: Teodoro Spray, MD;  Location: [place] CV LAB;  Service: Cardiovascular;  Laterality: N/A;  . CORONARY ANGIOPLASTY WITH STENT PLACEMENT     x 6 stents  . FOOT SURGERY Right    fracture repair with screw  . LEFT HEART CATH AND CORONARY ANGIOGRAPHY Right 06/01/2016   Procedure: Left Heart Cath and Coronary Angiography;  Surgeon: Isaias Cowman, MD;  Location: [facility] INVASIVE CV  LAB;  Service: Cardiovascular;  Laterality: Right;  . LEFT HEART CATH AND CORONARY ANGIOGRAPHY N/A 11/28/2016   Procedure: LEFT HEART CATH AND CORONARY ANGIOGRAPHY and possible pci;  Surgeon: Yolonda Kida, MD;  Location: [place] CV LAB;  Service: Cardiovascular;  Laterality: N/A;  . MASTOIDECTOMY Right   . NASAL SEPTUM SURGERY    . penial  inplant    . PENILE PROSTHESIS IMPLANT    . right mastoidectomy    . SCROTOPLASTY    . TONSILLECTOMY AND ADENOIDECTOMY      Prescriptions Prior to Admission  Medication Sig Dispense Refill Last Dose  . allopurinol (ZYLOPRIM) 100 MG tablet Take 100 mg by mouth daily.    11/29/2016 at 0800  . amLODipine (NORVASC) 5 MG tablet Take 5 mg by mouth at bedtime.    11/28/2016 at 2000  . apixaban (ELIQUIS) 5 MG TABS tablet Take 1 tablet (5 mg total) by mouth 2 (two) times daily. 60 tablet 0 11/29/2016 at 2000  . clopidogrel (PLAVIX) 75 MG tablet Take 75 mg by mouth daily. Reported on 07/26/2015   11/28/2016 at 0800  . colchicine 0.6 MG tablet Take 0.6 mg by mouth 2 (two) times daily as needed (gout flare).    PRN at PRN  . Continuous Blood Gluc Sensor (FREESTYLE LIBRE SENSOR SYSTEM) MISC Use 1 each as directed. Please place prior to office visit as directed   Taking  . diclofenac (VOLTAREN) 75 MG EC tablet Take 75 mg by mouth daily as needed (gout flare).    PRN at PRN  . divalproex (DEPAKOTE ER) 500 MG 24 hr tablet Take 1-2 tablets (500-1,000 mg total) by mouth 2 (two) times daily. Take 556m in morning and 10079min evening 270 tablet 1 11/29/2016 at 0800  . DULoxetine (CYMBALTA) 60 MG capsule Take 1 capsule (60 mg total) by mouth daily. 90 capsule 1 11/29/2016 at 0800  . esomeprazole (NEXIUM) 40 MG capsule Take 40 mg by mouth daily.    11/29/2016 at 0800  . fenofibrate (TRICOR) 145 MG tablet Take 145 mg by mouth daily.    11/29/2016 at 0800  . furosemide (LASIX) 40 MG tablet Take 1 tablet (40 mg total) by mouth daily. (Patient taking differently: Take 40 mg by mouth daily. Pt takes medication every other day) 30 tablet 0 11/28/2016 at 0800  . gemfibrozil (LOPID) 600 MG tablet gemfibrozil 600 mg tablet   11/29/2016 at 0800  . glucose 4 GM chewable tablet Chew 4 g by mouth as needed for low blood sugar.    PRN at PRN  . Insulin Disposable Pump (V-GO 30) KIT Inject 1.2 Units/hr into the skin every hour. 66 units/24  hrs (36 available bolus units--12 bolus units available per meals)   11/29/2016 at Unknown time  . insulin lispro (HUMALOG KWIKPEN) 100 UNIT/ML KiwkPen Inject into the skin See admin instructions. Used ONLY as needed if additional bolus units are needed (up to 36 units/24 hrs.)   PRN at PRN  . isosorbide mononitrate (IMDUR) 60 MG 24 hr tablet Take 60 mg by mouth daily.    11/29/2016 at 0800  . JUBLIA 10 % SOLN Apply 1 application topically daily.   11/30/2016 at Unknown time  . levothyroxine (SYNTHROID, LEVOTHROID) 50 MCG tablet Take 50 mcg by mouth daily before breakfast.   11/29/2016 at 0800  . metFORMIN (GLUCOPHAGE) 1000 MG tablet Take 1,000 mg by mouth 2 (two) times daily with a meal. Morning & supper   Past Week at Unknown time  .  metoprolol succinate (TOPROL-XL) 50 MG 24 hr tablet Take 50 mg by mouth daily.    11/29/2016 at 0800  . nitroGLYCERIN (NITROSTAT) 0.4 MG SL tablet Place 0.4 mg under the tongue every 5 (five) minutes x 3 doses as needed for chest pain.    PRN at PRN  . pregabalin (LYRICA) 100 MG capsule Take 100 mg by mouth 3 (three) times daily before meals.    11/30/2016 at 0800  . rOPINIRole (REQUIP) 2 MG tablet Take 2 mg by mouth 2 (two) times daily.    11/29/2016 at 2000  . rosuvastatin (CRESTOR) 20 MG tablet Take 20 mg by mouth at bedtime.    11/29/2016 at 2000  . VICTOZA 18 MG/3ML SOPN Inject 1.8 mg into the skin daily.    11/30/2016 at 0800  . vitamin C (ASCORBIC ACID) 500 MG tablet Take 500 mg by mouth daily.   11/30/2016 at 0800   Social History   Social History  . Marital status: Single    Spouse name: N/A  . Number of children: N/A  . Years of education: N/A   Occupational History  . retired    Social History Main Topics  . Smoking status: Never Smoker  . Smokeless tobacco: Never Used  . Alcohol use No  . Drug use: No  . Sexual activity: Not Currently    Birth control/ protection: None   Other Topics Concern  . Not on file   Social History Narrative   ** Merged  History Encounter **        Family History  Problem Relation Age of Onset  . Lung cancer Mother   . Heart attack Father   . Post-traumatic stress disorder Father   . Anxiety disorder Father   . Depression Father   . Heart attack Sister   . Heart attack Brother   . Prostate cancer Neg Hx   . Kidney cancer Neg Hx       Review of systems complete and found to be negative unless listed above      PHYSICAL EXAM  General: Well developed, well nourished, in no acute distress HEENT:  Normocephalic and atramatic Neck:  No JVD.  Lungs: Normal effort of breathing. No wheezing. Decreased breath sounds bilaterally Heart: HRRR . No murmurs, rubs, or gallops Abdomen: Bowel sounds are positive, abdomen soft and non-tender  Msk:  Back normal, normal gait. Normal strength and tone for age. Extremities: No clubbing, cyanosis or edema.   Neuro: Alert and oriented X 3. Psych:  Good affect, responds appropriately  Labs:   Lab Results  Component Value Date   WBC 8.9 11/30/2016   HGB 11.2 (L) 11/30/2016   HCT 33.7 (L) 11/30/2016   MCV 85.7 11/30/2016   PLT 290 11/30/2016    Recent Labs Lab 11/30/16 0415  NA 138  K 5.4*  CL 106  CO2 24  BUN 34*  CREATININE 1.49*  CALCIUM 8.8*  GLUCOSE 408*   Lab Results  Component Value Date   CKTOTAL 249 08/15/2013   CKMB 7.8 (H) 08/15/2013   TROPONINI 0.64 (HH) 11/30/2016    Lab Results  Component Value Date   CHOL 176 10/11/2015   CHOL 152 09/24/2015   CHOL 223 (H) 01/29/2014   Lab Results  Component Value Date   HDL 45 10/11/2015   HDL 48 09/24/2015   HDL 34 (L) 01/29/2014   Lab Results  Component Value Date   LDLCALC 89 10/11/2015   LDLCALC 46 09/24/2015   [place]  SEE COMMENT 01/29/2014   Lab Results  Component Value Date   TRIG 211 (H) 10/11/2015   TRIG 291 (H) 09/24/2015   TRIG 474 (H) 01/29/2014   Lab Results  Component Value Date   CHOLHDL 3.9 10/11/2015   CHOLHDL 3.2 09/24/2015   No results found for:  LDLDIRECT    Radiology: Dg Chest 2 View  Result Date: 11/28/2016 CLINICAL DATA:  Chest pain, shortness of breath. EXAM: CHEST  2 VIEW COMPARISON:  Radiographs of November 27, 2016. FINDINGS: The heart size and mediastinal contours are within normal limits. Both lungs are clear. No pneumothorax or pleural effusion is noted. The visualized skeletal structures are unremarkable. IMPRESSION: No active cardiopulmonary disease. Pulmonary edema noted on prior exam has resolved. Electronically Signed   By: Marijo Conception, M.D.   On: 11/28/2016 08:06   Dg Chest 2 View  Result Date: 11/27/2016 CLINICAL DATA:  Chest pain EXAM: CHEST  2 VIEW COMPARISON:  11/03/2016 FINDINGS: Borderline cardiomegaly. There is generalized Kerley lines and fissure thickening. No effusion or pneumothorax. Negative aorta and hila. IMPRESSION: Pulmonary interstitial edema. Electronically Signed   By: Monte Fantasia M.D.   On: 11/27/2016 08:26   Ct Angio Chest Pe W And/or Wo Contrast  Result Date: 11/30/2016 CLINICAL DATA:  Acute onset of shortness of breath and generalized chest pressure. Initial encounter. EXAM: CT ANGIOGRAPHY CHEST WITH CONTRAST TECHNIQUE: Multidetector CT imaging of the chest was performed using the standard protocol during bolus administration of intravenous contrast. Multiplanar CT image reconstructions and MIPs were obtained to evaluate the vascular anatomy. CONTRAST:  75 mL of Isovue 370 IV contrast COMPARISON:  Chest radiograph performed earlier today at 4:27 a.m., and CTA of the chest performed 10/10/2014 FINDINGS: Cardiovascular: There is no evidence of significant pulmonary embolus. Evaluation for pulmonary embolus is somewhat suboptimal due to motion artifact. The heart remains normal in size. Diffuse coronary artery calcifications are seen. Scattered calcification is seen along the aortic arch. The proximal great vessels are grossly unremarkable in appearance. Mediastinum/Nodes: Mildly enlarged right  paratracheal and superior mediastinal nodes measure up to 1.2 cm in short axis. No pericardial effusion is identified. The thyroid gland is unremarkable. No axillary lymphadenopathy is seen. Lungs/Pleura: Diffuse interstitial prominence is noted, with hazy bilateral airspace opacities, compatible with pulmonary edema. No pleural effusion or pneumothorax is seen. No dominant mass is identified. Upper Abdomen: The visualized portions of the liver and spleen are unremarkable, aside from an apparent calcified granuloma within the spleen. The visualized portions of the gallbladder, pancreas, adrenal glands and stomach are within normal limits. Musculoskeletal: No acute osseous abnormalities are identified. The visualized musculature is unremarkable in appearance. Review of the MIP images confirms the above findings. IMPRESSION: 1. No evidence of significant pulmonary embolus. 2. Diffuse interstitial prominence and hazy bilateral airspace opacities are compatible with pulmonary edema. 3. Diffuse coronary artery calcifications seen. 4. Mildly enlarged mediastinal nodes measure up to 1.2 cm in short axis. Electronically Signed   By: Garald Balding M.D.   On: 11/30/2016 06:19   Nm Myocar Multi W/spect W/wall Motion / Ef  Result Date: 11/04/2016  Blood pressure demonstrated a normal response to exercise.  There was no ST segment deviation noted during stress.  This is a low risk study.  The left ventricular ejection fraction is moderately decreased (30-44%).    Dg Chest Port 1 View  Result Date: 11/30/2016 CLINICAL DATA:  Acute onset of shortness of breath and generalized chest pressure. Initial encounter. EXAM: PORTABLE CHEST  1 VIEW COMPARISON:  Chest radiograph performed 11/28/2016 FINDINGS: The lungs are well-aerated. Vascular congestion is noted. Increased interstitial markings raise concern for mild interstitial edema. There is no evidence of pleural effusion or pneumothorax. The cardiomediastinal silhouette  is borderline normal in size. No acute osseous abnormalities are seen. IMPRESSION: Vascular congestion. Increased interstitial markings raise concern for mild interstitial edema. Electronically Signed   By: Garald Balding M.D.   On: 11/30/2016 05:00   Dg Chest Portable 1 View  Result Date: 11/03/2016 CLINICAL DATA:  66 year old male with central chest pain since last night. Coronary artery disease, cardiac stent. EXAM: PORTABLE CHEST 1 VIEW COMPARISON:  05/31/2016 and earlier. FINDINGS: Portable AP upright view at 0852 hours. Stable lung volumes. Mediastinal contours are stable and within normal limits. Visualized tracheal air column is within normal limits. Allowing for portable technique the lungs are clear. No pneumothorax or pleural effusion. IMPRESSION: No acute cardiopulmonary abnormality. Electronically Signed   By: Genevie Ann M.D.   On: 11/03/2016 09:07    EKG: Sinus rhythm, rate 71 bpm  ASSESSMENT AND PLAN:  1. Acute on chronic diastolic heart failure with pulmonary edema and hypoxic respiratory failure 2. Coronary artery disease, with multiple stents, troponin 0.64, followed by 1.64, with resolution of chest pressure, with recent cardiac catheterization 3 days ago, revealing mild to moderate CAD (mid LAD to dist LAD lesion 60 % stenosed, mid LAD lesion 65 % stenosed, prox LAD lesion 60 % stenosed, left ventricular systolic function normal) without significant change from cardiac catheterization in 05/2016. Elevated troponin likely due to demand-supply ischemia rather than ACS. 3. Atrial fibrillation, on Eliquis for stroke prevention, rate controlled 4. Aortic stenosis, moderate per echocardiogram, of uncertain clinical significance 5. Hypertension, controlled on current BP medications 6. Type 2 diabetes   Recommendations: 1. Continue current therapy 2. Continue diuresis with careful monitoring of renal status 3. Continue Eliquis for stroke prevention 4. Continue to cycle cardiac  enzymes 5. Recommend discontinuing heparin   Signed: Clabe Seal PA-C 11/30/2016, 8:27 AM

## 2016-11-30 NOTE — Progress Notes (Signed)
Pt was placed on Non Invasive vent in the AVAPs setting due to the pt sating that he could not tolerate the standard Bipap settings.  The Pt stated that he is now able to breath comfortably on the AVAPS settings.

## 2016-11-30 NOTE — H&P (Addendum)
[facility] [place] NAME: Dennis Mullen    MR#:  [id number]  DATE OF BIRTH:  [birth year]  DATE OF ADMISSION:  11/30/2016  PRIMARY CARE PHYSICIAN: Dennis Guise, MD   REQUESTING/REFERRING PHYSICIAN:   CHIEF COMPLAINT:   Chief Complaint  Patient presents with  . Chest Pain    HISTORY OF PRESENT ILLNESS: Dennis Mullen  is a 66 y.o. male with a known history of bipolar disorder, coronary artery disease, congestive heart failure, diabetes mellitus type 2, sleep apnea, peripheral vascular disease presented to the emergency room with increased shortness of breath for the last few days. Patient also has some chest pressure and difficulty breathing. He was initially put on oxygen via non rebreather mask because his O2 saturation was low. He recently had cardiac catheterization and had 2 procedures done in the last 1 year. Patient was worked up with CT angiogram of the chest which showed pulmonary edema but no pulmonary embolism. He became more acutely short of breath and was put on BiPAP for respiratory distress. Patient was given IV Lasix for diuresis. Hospitalist service was consulted.His troponin was elevated 0.6. PAST MEDICAL HISTORY:   Past Medical History:  Diagnosis Date  . Abnormal levels of other serum enzymes   . Anxiety   . Bipolar disorder ([place])   . BPH (benign prostatic hyperplasia)   . CAD (coronary artery disease)   . CHF (congestive heart failure) ([place])    ?  Marland Kitchen Depression   . Diabetes mellitus, type II ([place])   . Diabetes mellitus, type II, insulin dependent ([place])   . ED (erectile dysfunction)   . GERD (gastroesophageal reflux disease)   . Gout   . History of borderline personality disorder   . Hypertension   . Hypogonadism in male   . Hypothyroidism   . Insomnia   . Mood disorder ([place])   . Neuropathy   . Obesity   . OSA (obstructive sleep apnea)   . PVD (peripheral vascular disease) ([place])   . Thyroid disease      PAST SURGICAL HISTORY: Past Surgical History:  Procedure Laterality Date  . AMPUTATION TOE Left 08/25/2016   Procedure: AMPUTATION TOE/ipj left 2nd toe;  Surgeon: Dennis Mullen, DPM;  Location: [facility];  Service: Podiatry;  Laterality: Left;  . CARDIAC CATHETERIZATION N/A 10/12/2015   Procedure: Left Heart Cath and Coronary Angiography;  Surgeon: Dennis Spray, MD;  Location: [facility];  Service: Cardiovascular;  Laterality: N/A;  . CORONARY ANGIOPLASTY WITH STENT PLACEMENT     x 6 stents  . FOOT SURGERY Right    fracture repair with screw  . LEFT HEART CATH AND CORONARY ANGIOGRAPHY Right 06/01/2016   Procedure: Left Heart Cath and Coronary Angiography;  Surgeon: Dennis Cowman, MD;  Location: [facility];  Service: Cardiovascular;  Laterality: Right;  . LEFT HEART CATH AND CORONARY ANGIOGRAPHY N/A 11/28/2016   Procedure: LEFT HEART CATH AND CORONARY ANGIOGRAPHY and possible pci;  Surgeon: Dennis Kida, MD;  Location: [facility];  Service: Cardiovascular;  Laterality: N/A;  . MASTOIDECTOMY Right   . NASAL SEPTUM SURGERY    . penial inplant    . PENILE PROSTHESIS IMPLANT    . right mastoidectomy    . SCROTOPLASTY    . TONSILLECTOMY AND ADENOIDECTOMY      SOCIAL HISTORY:  Social History  Substance Use Topics  . Smoking status: Never Smoker  . Smokeless tobacco: Never Used  .  Alcohol use No    FAMILY HISTORY:  Family History  Problem Relation Age of Onset  . Lung cancer Mother   . Heart attack Father   . Post-traumatic stress disorder Father   . Anxiety disorder Father   . Depression Father   . Heart attack Sister   . Heart attack Brother   . Prostate cancer Neg Hx   . Kidney cancer Neg Hx     DRUG ALLERGIES: No Known Allergies  REVIEW OF SYSTEMS:   CONSTITUTIONAL: No fever, has weakness.  EYES: No blurred or double vision.  EARS, NOSE, AND THROAT: No tinnitus or ear pain.  RESPIRATORY: No cough, has shortness of breath,  No  wheezing or hemoptysis.  CARDIOVASCULAR: Has chest pressure, orthopnea,  No edema.  GASTROINTESTINAL: No nausea, vomiting, diarrhea or abdominal pain.  GENITOURINARY: No dysuria, hematuria.  ENDOCRINE: No polyuria, nocturia,  HEMATOLOGY: No anemia, easy bruising or bleeding SKIN: No rash or lesion. MUSCULOSKELETAL: No joint pain or arthritis.   NEUROLOGIC: No tingling, numbness, weakness.  PSYCHIATRY: No anxiety or depression.   MEDICATIONS AT HOME:  Prior to Admission medications   Medication Sig Start Date End Date Taking? Authorizing Provider  allopurinol (ZYLOPRIM) 100 MG tablet Take 100 mg by mouth daily.     [provider]  amLODipine (NORVASC) 5 MG tablet Take 5 mg by mouth at bedtime.  10/14/14   [provider]  apixaban (ELIQUIS) 5 MG TABS tablet Take 1 tablet (5 mg total) by mouth 2 (two) times daily. 06/02/16   Hillary Bow, MD  clopidogrel (PLAVIX) 75 MG tablet Take 75 mg by mouth daily. Reported on 07/26/2015    [provider]  colchicine 0.6 MG tablet Take 0.6 mg by mouth 2 (two) times daily as needed (gout flare).  11/08/15   [provider]  Continuous Blood Gluc Sensor (FREESTYLE LIBRE SENSOR SYSTEM) MISC Use 1 each as directed. Please place prior to office visit as directed 06/20/16   [provider]  diclofenac (VOLTAREN) 75 MG EC tablet Take 75 mg by mouth daily as needed (gout flare).  12/09/15   [provider]  divalproex (DEPAKOTE ER) 500 MG 24 hr tablet Take 1-2 tablets (500-1,000 mg total) by mouth 2 (two) times daily. Take 525m in morning and 10027min evening 08/24/16   Clapacs, JoMadie RenoMD  DULoxetine (CYMBALTA) 60 MG capsule Take 1 capsule (60 mg total) by mouth daily. 08/24/16   Clapacs, JoMadie RenoMD  esomeprazole (NEXIUM) 40 MG capsule Take 40 mg by mouth daily.  10/14/14   [provider]  fenofibrate (TRICOR) 145 MG tablet Take 145 mg by mouth daily.  12/23/15 12/22/16  [provider]  furosemide  (LASIX) 40 MG tablet Take 1 tablet (40 mg total) by mouth daily. Patient taking differently: Take 40 mg by mouth daily. Pt takes medication every other day 09/25/15   WiLoletha GrayerMD  gemfibrozil (LOPID) 600 MG tablet gemfibrozil 600 mg tablet    [provider]  glucose 4 GM chewable tablet Chew 4 g by mouth as needed for low blood sugar.     [provider]  Insulin Disposable Pump (V-GO 30) KIT Inject 1.2 Units/hr into the skin every hour. 66 units/24 hrs (36 available bolus units--12 bolus units available per meals) 10/07/14   [provider]  insulin lispro (HUMALOG KWIKPEN) 100 UNIT/ML KiwkPen Inject into the skin See admin instructions. Used ONLY as needed if additional bolus units are needed (up to  36 units/24 hrs.)    [provider]  isosorbide mononitrate (IMDUR) 60 MG 24 hr tablet Take 60 mg by mouth daily.  05/09/16 05/09/17  [provider]  JUBLIA 10 % SOLN Apply 1 application topically daily. 10/31/16   [provider]  levothyroxine (SYNTHROID, LEVOTHROID) 50 MCG tablet Take 50 mcg by mouth daily before breakfast.    [provider]  metFORMIN (GLUCOPHAGE) 1000 MG tablet Take 1,000 mg by mouth 2 (two) times daily with a meal. Morning & supper    [provider]  metoprolol succinate (TOPROL-XL) 50 MG 24 hr tablet Take 50 mg by mouth daily.  09/11/14   [provider]  nitroGLYCERIN (NITROSTAT) 0.4 MG SL tablet Place 0.4 mg under the tongue every 5 (five) minutes x 3 doses as needed for chest pain.     [provider]  pregabalin (LYRICA) 100 MG capsule Take 100 mg by mouth 3 (three) times daily before meals.     [provider]  rOPINIRole (REQUIP) 2 MG tablet Take 2 mg by mouth 2 (two) times daily.  04/27/15   [provider]  rosuvastatin (CRESTOR) 20 MG tablet Take 20 mg by mouth at bedtime.     [provider]  VICTOZA 18 MG/3ML SOPN Inject 1.8 mg into the skin  daily.  09/07/14   [provider]  vitamin C (ASCORBIC ACID) 500 MG tablet Take 500 mg by mouth daily.    [provider]      PHYSICAL EXAMINATION:   VITAL SIGNS: Blood pressure (!) 168/70, pulse 81, temperature 98.1 F (36.7 C), temperature source Oral, resp. rate 15, height 6' 2"  (1.88 m), weight 129.3 kg (285 lb), SpO2 91 %.  GENERAL:  66 y.o.-year-old patient lying in the bed on bipap EYES: Pupils equal, round, reactive to light and accommodation. No scleral icterus. Extraocular muscles intact.  HEENT: Head atraumatic, normocephalic. Oropharynx and nasopharynx clear.  NECK:  Supple, no jugular venous distention. No thyroid enlargement, no tenderness.  LUNGS: Decreased breath sounds bilaterally, bibasilar crepitations heard.  Scattered rhonchi heard CARDIOVASCULAR: S1, S2 normal. No murmurs, rubs, or gallops.  ABDOMEN: Soft, nontender, nondistended. Bowel sounds present. No organomegaly or mass.  EXTREMITIES: No pedal edema, cyanosis, or clubbing.  NEUROLOGIC: Cranial nerves II through XII are intact. Muscle strength 5/5 in all extremities. Sensation intact. Gait not checked.  PSYCHIATRIC: The patient is alert and oriented x 3.  SKIN: No obvious rash, lesion, or ulcer.   LABORATORY PANEL:   CBC  Recent Labs Lab 11/27/16 0743 11/28/16 0624 11/30/16 0415  WBC 7.5 6.5 8.9  HGB 10.2* 11.3* 11.2*  HCT 31.6* 34.8* 33.7*  PLT 276 272 290  MCV 85.8 84.6 85.7  MCH 27.5 27.6 28.4  MCHC 32.1 32.6 33.1  RDW 19.0* 18.6* 18.9*  LYMPHSABS 1.9  --   --   MONOABS 0.9  --   --   EOSABS 0.2  --   --   BASOSABS 0.1  --   --    ------------------------------------------------------------------------------------------------------------------  Chemistries   Recent Labs Lab 11/27/16 0743 11/28/16 0624 11/30/16 0415  NA 134* 140 138  K 5.6* 4.3 5.4*  CL 104 106 106  CO2 23 26 24   GLUCOSE 314* 118* 408*  BUN 27* 25* 34*  CREATININE 1.32* 1.17 1.49*  CALCIUM  8.6* 9.3 8.8*   ------------------------------------------------------------------------------------------------------------------ estimated creatinine clearance is 71.6 mL/min (A) (by C-G formula based on SCr of 1.49 mg/dL (H)). ------------------------------------------------------------------------------------------------------------------ No results for input(s):  TSH, T4TOTAL, T3FREE, THYROIDAB in the last 72 hours.  Invalid input(s): FREET3   Coagulation profile  Recent Labs Lab 11/27/16 0845  INR 1.07   ------------------------------------------------------------------------------------------------------------------- No results for input(s): DDIMER in the last 72 hours. -------------------------------------------------------------------------------------------------------------------  Cardiac Enzymes  Recent Labs Lab 11/27/16 1604 11/27/16 2044 11/30/16 0415  TROPONINI 0.19* 0.22* 0.64*   ------------------------------------------------------------------------------------------------------------------ Invalid input(s): POCBNP  ---------------------------------------------------------------------------------------------------------------  Urinalysis    Component Value Date/Time   COLORURINE YELLOW (A) 11/14/2014 1415   APPEARANCEUR CLEAR (A) 11/14/2014 1415   APPEARANCEUR Clear 01/26/2014 0759   LABSPEC 1.013 11/14/2014 1415   LABSPEC 1.021 01/26/2014 0759   PHURINE 5.0 11/14/2014 1415   GLUCOSEU NEGATIVE 11/14/2014 1415   GLUCOSEU >=500 01/26/2014 0759   HGBUR NEGATIVE 11/14/2014 1415   BILIRUBINUR NEGATIVE 11/14/2014 1415   BILIRUBINUR Negative 01/26/2014 [place] 11/14/2014 1415   PROTEINUR NEGATIVE 11/14/2014 1415   NITRITE NEGATIVE 11/14/2014 1415   LEUKOCYTESUR NEGATIVE 11/14/2014 1415   LEUKOCYTESUR Negative 01/26/2014 0759     RADIOLOGY: Dg Chest 2 View  Result Date: 11/28/2016 CLINICAL DATA:  Chest pain, shortness of breath.  EXAM: CHEST  2 VIEW COMPARISON:  Radiographs of November 27, 2016. FINDINGS: The heart size and mediastinal contours are within normal limits. Both lungs are clear. No pneumothorax or pleural effusion is noted. The visualized skeletal structures are unremarkable. IMPRESSION: No active cardiopulmonary disease. Pulmonary edema noted on prior exam has resolved. Electronically Signed   By: Marijo Conception, M.D.   On: 11/28/2016 08:06   Ct Angio Chest Pe W And/or Wo Contrast  Result Date: 11/30/2016 CLINICAL DATA:  Acute onset of shortness of breath and generalized chest pressure. Initial encounter. EXAM: CT ANGIOGRAPHY CHEST WITH CONTRAST TECHNIQUE: Multidetector CT imaging of the chest was performed using the standard protocol during bolus administration of intravenous contrast. Multiplanar CT image reconstructions and MIPs were obtained to evaluate the vascular anatomy. CONTRAST:  75 mL of Isovue 370 IV contrast COMPARISON:  Chest radiograph performed earlier today at 4:27 a.m., and CTA of the chest performed 10/10/2014 FINDINGS: Cardiovascular: There is no evidence of significant pulmonary embolus. Evaluation for pulmonary embolus is somewhat suboptimal due to motion artifact. The heart remains normal in size. Diffuse coronary artery calcifications are seen. Scattered calcification is seen along the aortic arch. The proximal great vessels are grossly unremarkable in appearance. Mediastinum/Nodes: Mildly enlarged right paratracheal and superior mediastinal nodes measure up to 1.2 cm in short axis. No pericardial effusion is identified. The thyroid gland is unremarkable. No axillary lymphadenopathy is seen. Lungs/Pleura: Diffuse interstitial prominence is noted, with hazy bilateral airspace opacities, compatible with pulmonary edema. No pleural effusion or pneumothorax is seen. No dominant mass is identified. Upper Abdomen: The visualized portions of the liver and spleen are unremarkable, aside from an apparent  calcified granuloma within the spleen. The visualized portions of the gallbladder, pancreas, adrenal glands and stomach are within normal limits. Musculoskeletal: No acute osseous abnormalities are identified. The visualized musculature is unremarkable in appearance. Review of the MIP images confirms the above findings. IMPRESSION: 1. No evidence of significant pulmonary embolus. 2. Diffuse interstitial prominence and hazy bilateral airspace opacities are compatible with pulmonary edema. 3. Diffuse coronary artery calcifications seen. 4. Mildly enlarged mediastinal nodes measure up to 1.2 cm in short axis. Electronically Signed   By: Garald Balding M.D.   On: 11/30/2016 06:19   Dg Chest Port 1 View  Result Date: 11/30/2016 CLINICAL DATA:  Acute onset of shortness of breath and  generalized chest pressure. Initial encounter. EXAM: PORTABLE CHEST 1 VIEW COMPARISON:  Chest radiograph performed 11/28/2016 FINDINGS: The lungs are well-aerated. Vascular congestion is noted. Increased interstitial markings raise concern for mild interstitial edema. There is no evidence of pleural effusion or pneumothorax. The cardiomediastinal silhouette is borderline normal in size. No acute osseous abnormalities are seen. IMPRESSION: Vascular congestion. Increased interstitial markings raise concern for mild interstitial edema. Electronically Signed   By: Garald Balding M.D.   On: 11/30/2016 05:00    EKG: Orders placed or performed during the hospital encounter of 11/30/16  . ED EKG  . ED EKG  . ED EKG within 10 minutes  . ED EKG within 10 minutes    IMPRESSION AND PLAN: 66 year old male patient with history of coronary artery disease, congestive heart failure, diabetes mellitus, GERD, hypertension presented to the emergency room with chest pressure and shortness of breath. Troponin was elevated  Admitting diagnosis 1. Acute pulmonary edema 2. Non-STEMI 3. Hypoxic Respiratory failure 4. Congestive heart failure 5.  Diabetes mellitus type 2 Treatment plan Admit patient to stepdown unit Continue BiPAP for respiratory distress Diurese patient with IV Lasix 40 MG every 12 hourly Cardiology consultation IV heparin drip for anticoagulation Cycle troponin Check echocardiogram  Notified intensivist on call All the records are reviewed and case discussed with ED provider. Management plans discussed with the patient, family and they are in agreement.  CODE STATUS:FULL CODE Code Status History    Date Active Date Inactive Code Status Order ID Comments User Context   11/27/2016 11:03 AM 11/28/2016  9:35 PM Full Code [id number]  Vaughan Basta, MD Inpatient   11/03/2016  3:31 PM 11/04/2016  8:36 PM Full Code [id number]  Demetrios Loll, MD Inpatient   08/25/2016 11:42 AM 08/25/2016  6:20 PM Full Code [id number]  Dennis Mullen, DPM Inpatient   05/31/2016 12:18 PM 06/02/2016  4:31 PM Full Code [id number]  Gladstone Lighter, MD ED   03/12/2016  5:57 PM 03/13/2016  7:52 PM Full Code [id number]  Nicholes Mango, MD Inpatient   02/02/2016  1:37 AM 02/03/2016 10:08 PM Full Code [id number]  Holley Raring, NP ED   10/11/2015 12:44 PM 10/12/2015  5:30 PM Full Code [id number]  Vaughan Basta, MD Inpatient   09/24/2015  3:12 AM 09/25/2015  3:06 PM Full Code [id number]  Saundra Shelling, MD Inpatient   11/14/2014  5:07 AM 11/15/2014  4:53 PM Full Code [id number]  Harrie Foreman, MD ED       TOTAL CRITICAL CARE TIME TAKING CARE OF THIS PATIENT: 54 minutes.    Saundra Shelling M.D on 11/30/2016 at 6:44 AM  Between 7am to 6pm - Pager - [phone]  After 6pm go to [email or web address] - password [email or web address] [facility]  [facility]  Office  [phone]  CC: Primary care physician; Dennis Guise, MD

## 2016-11-30 NOTE — ED Notes (Signed)
Pt awake, alert & oriented x4. Skin warm and dry. NAD. Pt on BiPap.

## 2016-11-30 NOTE — ED Notes (Signed)
Pt going to CT

## 2016-11-30 NOTE — ED Triage Notes (Signed)
Pt presents to ED 18 via EMS from home c/o shortness of breath and related chest pain, described as pressure, since 2300 last night. Pt states no pain at the moment since he is breathing ok at this time; per EMS pt put on 15L via non-re-breather mask, prior to that pt was at 80% on RA; pt is awake, alert, and oriented x4.

## 2016-11-30 NOTE — ED Notes (Addendum)
CBG = 432; MD notified

## 2016-11-30 NOTE — Progress Notes (Signed)
ANTICOAGULATION CONSULT NOTE - Initial Consult  Pharmacy Consult for heparin drip management Indication: chest pain/ACS  No Known Allergies  Patient Measurements: Height: 6\' 2"  (188 cm) Weight: 276 lb 7.3 oz (125.4 kg) IBW/kg (Calculated) : 82.2 Heparin Dosing Weight: 109kg   Vital Signs: Temp: 99.2 F (37.3 C) (08/16 0809) Temp Source: Axillary (08/16 0809) BP: 119/60 (08/16 1500) Pulse Rate: 66 (08/16 1500)  Labs:  Recent Labs  11/27/16 2201 11/28/16 0624 11/30/16 0415 11/30/16 0841 11/30/16 1012 11/30/16 1356  HGB  --  11.3* 11.2*  --   --   --   HCT  --  34.8* 33.7*  --   --   --   PLT  --  272 290  --   --   --   APTT  --   --   --   --  32  --   LABPROT  --   --   --   --  14.4  --   INR  --   --   --   --  1.11  --   HEPARINUNFRC 0.39 0.39  --   --  <0.10*  --   CREATININE  --  1.17 1.49*  --   --   --   TROPONINI  --   --  0.64* 1.64*  --  1.60*    Estimated Creatinine Clearance: 70.5 mL/min (A) (by C-G formula based on SCr of 1.49 mg/dL (H)).   Medical History: Past Medical History:  Diagnosis Date  . Abnormal levels of other serum enzymes   . Anxiety   . Bipolar disorder ([place])   . BPH (benign prostatic hyperplasia)   . CAD (coronary artery disease)   . CHF (congestive heart failure) ([place])    ?  Marland Kitchen Depression   . Diabetes mellitus, type II ([place])   . Diabetes mellitus, type II, insulin dependent ([place])   . ED (erectile dysfunction)   . GERD (gastroesophageal reflux disease)   . Gout   . History of borderline personality disorder   . Hypertension   . Hypogonadism in male   . Hypothyroidism   . Insomnia   . Mood disorder ([place])   . Neuropathy   . Obesity   . OSA (obstructive sleep apnea)   . PVD (peripheral vascular disease) ([place])   . Thyroid disease     Medications:  Scheduled:  . allopurinol  100 mg Oral Daily  . amLODipine  5 mg Oral QHS  . [START ON 12/01/2016] aspirin EC  81 mg Oral Daily  . clopidogrel  75 mg Oral Daily  .  divalproex  500 mg Oral Daily   And  . divalproex  1,000 mg Oral QHS  . DULoxetine  60 mg Oral Daily  . fenofibrate  54 mg Oral Daily  . furosemide  40 mg Intravenous Q12H  . gemfibrozil  600 mg Oral BID AC  . insulin aspart  0-20 Units Subcutaneous TID WC  . insulin detemir  26 Units Subcutaneous Daily  . isosorbide mononitrate  60 mg Oral Daily  . levothyroxine  50 mcg Oral QAC breakfast  . mouth rinse  15 mL Mouth Rinse BID  . metoprolol succinate  50 mg Oral Daily  . pantoprazole  40 mg Oral Daily  . pregabalin  100 mg Oral TID AC  . rOPINIRole  2 mg Oral BID  . rosuvastatin  20 mg Oral QHS  . sodium chloride flush  3 mL Intravenous Q12H  .  vitamin C  500 mg Oral Daily   Infusions:  . sodium chloride    . heparin 1,500 Units/hr (11/30/16 1030)    Assessment: Pharmacy consulted for heparin drip management for 66 yo male admitted with NSTEMI. Patient reports last dose of apixiban in am on 8/15, baseline anti-Xa < 0.1.   Goal of Therapy:  Heparin level 0.3-0.7 units/ml Monitor platelets by anticoagulation protocol: Yes   Plan:  Will bolus 4000 units x 1 and initiate heparin 1500 units/hr. Will obtain anti-Xa level at 1700.   Pharmacy will continue to monitor and adjust per consult.   Hadleigh Felber L 11/30/2016,4:50 PM

## 2016-12-01 LAB — BASIC METABOLIC PANEL
ANION GAP: 7 (ref 5–15)
BUN: 30 mg/dL — ABNORMAL HIGH (ref 6–20)
CHLORIDE: 103 mmol/L (ref 101–111)
CO2: 28 mmol/L (ref 22–32)
Calcium: 9.1 mg/dL (ref 8.9–10.3)
Creatinine, Ser: 1.28 mg/dL — ABNORMAL HIGH (ref 0.61–1.24)
GFR calc Af Amer: >60 mL/min (ref >60)
GFR, EST NON AFRICAN AMERICAN: 58 mL/min — AB (ref >60)
GLUCOSE: 142 mg/dL — AB (ref 65–99)
POTASSIUM: 4.4 mmol/L (ref 3.5–5.1)
Sodium: 138 mmol/L (ref 135–145)

## 2016-12-01 LAB — MAGNESIUM: MAGNESIUM: 2.5 mg/dL — AB (ref 1.7–2.4)

## 2016-12-01 LAB — HEPARIN LEVEL (UNFRACTIONATED): Heparin Unfractionated: 0.44 IU/mL (ref 0.30–0.70)

## 2016-12-01 LAB — GLUCOSE, CAPILLARY
Glucose-Capillary: 152 mg/dL — ABNORMAL HIGH (ref 65–99)
Glucose-Capillary: 310 mg/dL — ABNORMAL HIGH (ref 65–99)
Glucose-Capillary: 196 mg/dL — ABNORMAL HIGH (ref 65–99)
Glucose-Capillary: 211 mg/dL — ABNORMAL HIGH (ref 65–99)

## 2016-12-01 LAB — CBC
HCT: 32 % — ABNORMAL LOW (ref 40.0–52.0)
HEMOGLOBIN: 10.7 g/dL — AB (ref 13.0–18.0)
MCH: 28.3 pg (ref 26.0–34.0)
MCHC: 33.4 g/dL (ref 32.0–36.0)
MCV: 85 fL (ref 80.0–100.0)
PLATELETS: 243 10*3/uL (ref 150–440)
RBC: 3.76 MIL/uL — ABNORMAL LOW (ref 4.40–5.90)
RDW: 18.7 % — AB (ref 11.5–14.5)
WBC: 7.7 10*3/uL (ref 3.8–10.6)

## 2016-12-01 LAB — PHOSPHORUS: PHOSPHORUS: 4.3 mg/dL (ref 2.5–4.6)

## 2016-12-01 MED ORDER — INSULIN ASPART 100 UNIT/ML ~~LOC~~ SOLN
0.0000 [IU] | Freq: Three times a day (TID) | SUBCUTANEOUS | Status: DC
  Administered 2016-12-01 (×2): 4 [IU] via SUBCUTANEOUS
  Administered 2016-12-01: 7 [IU] via SUBCUTANEOUS
  Administered 2016-12-01: 15 [IU] via SUBCUTANEOUS
  Administered 2016-12-02: 7 [IU] via SUBCUTANEOUS
  Administered 2016-12-02: 3 [IU] via SUBCUTANEOUS
  Filled 2016-12-01 (×6): qty 1

## 2016-12-01 NOTE — Progress Notes (Signed)
Pt requested taking off BiPAP. Pt states it is "annoying and blows in my eyes". Pt put on nasal canula at 3L. No SOB or distress noted. Pt educated on risks and benefits. Will continue to monitor

## 2016-12-01 NOTE — Progress Notes (Signed)
[facility] Cardiology  SUBJECTIVE: Patient reports feeling better this morning. He states his breathing is much improved. He denies recurrence of chest pain. He denies experiencing palpitations.   Vitals:   11/30/16 2000 11/30/16 2133 11/30/16 2309 12/01/16 0808  BP: (!) 161/66 125/62 135/60 (!) 139/57  Pulse: 64  (!) 59 (!) 57  Resp:   16 18  Temp: 98.4 F (36.9 C)  98.3 F (36.8 C) 98.4 F (36.9 C)  TempSrc: Oral  Oral Oral  SpO2: 96%  91% 95%  Weight:      Height:         Intake/Output Summary (Last 24 hours) at 12/01/16 7124 Last data filed at 12/01/16 0400  Gross per 24 hour  Intake            622.5 ml  Output             2350 ml  Net          -1727.5 ml      PHYSICAL EXAM  General: Well developed, well nourished, in no acute distress HEENT:  Normocephalic and atramatic Neck:  No JVD.  Lungs: Normal effort of breathing on supplemental oxygen. No wheezing Heart: HRRR . Normal S1 and S2 without gallops or murmurs.  Abdomen: Bowel sounds are positive, abdomen soft and non-tender  Msk:  Back normal, sitting upright in bed Extremities: No clubbing, cyanosis or edema.   Neuro: Alert and oriented X 3. Psych:  Good affect, responds appropriately   LABS: Basic Metabolic Panel:  Recent Labs  11/30/16 0415 12/01/16 0433  NA 138 138  K 5.4* 4.4  CL 106 103  CO2 24 28  GLUCOSE 408* 142*  BUN 34* 30*  CREATININE 1.49* 1.28*  CALCIUM 8.8* 9.1  MG  --  2.5*  PHOS  --  4.3   Liver Function Tests: No results for input(s): AST, ALT, ALKPHOS, BILITOT, PROT, ALBUMIN in the last 72 hours. No results for input(s): LIPASE, AMYLASE in the last 72 hours. CBC:  Recent Labs  11/30/16 0415 12/01/16 0433  WBC 8.9 7.7  HGB 11.2* 10.7*  HCT 33.7* 32.0*  MCV 85.7 85.0  PLT 290 243   Cardiac Enzymes:  Recent Labs  11/30/16 0841 11/30/16 1356 11/30/16 2007  TROPONINI 1.64* 1.60* 1.23*   BNP: Invalid input(s): POCBNP D-Dimer: No results for input(s): DDIMER in the  last 72 hours. Hemoglobin A1C:  Recent Labs  11/30/16 1012  HGBA1C 7.3*   Fasting Lipid Panel:  Recent Labs  11/30/16 1012  CHOL 190  HDL 43  LDLCALC 101*  TRIG 229*  CHOLHDL 4.4   Thyroid Function Tests: No results for input(s): TSH, T4TOTAL, T3FREE, THYROIDAB in the last 72 hours.  Invalid input(s): FREET3 Anemia Panel: No results for input(s): VITAMINB12, FOLATE, FERRITIN, TIBC, IRON, RETICCTPCT in the last 72 hours.  Ct Angio Chest Pe W And/or Wo Contrast  Result Date: 11/30/2016 CLINICAL DATA:  Acute onset of shortness of breath and generalized chest pressure. Initial encounter. EXAM: CT ANGIOGRAPHY CHEST WITH CONTRAST TECHNIQUE: Multidetector CT imaging of the chest was performed using the standard protocol during bolus administration of intravenous contrast. Multiplanar CT image reconstructions and MIPs were obtained to evaluate the vascular anatomy. CONTRAST:  75 mL of Isovue 370 IV contrast COMPARISON:  Chest radiograph performed earlier today at 4:27 a.m., and CTA of the chest performed 10/10/2014 FINDINGS: Cardiovascular: There is no evidence of significant pulmonary embolus. Evaluation for pulmonary embolus is somewhat suboptimal due to motion artifact. The  heart remains normal in size. Diffuse coronary artery calcifications are seen. Scattered calcification is seen along the aortic arch. The proximal great vessels are grossly unremarkable in appearance. Mediastinum/Nodes: Mildly enlarged right paratracheal and superior mediastinal nodes measure up to 1.2 cm in short axis. No pericardial effusion is identified. The thyroid gland is unremarkable. No axillary lymphadenopathy is seen. Lungs/Pleura: Diffuse interstitial prominence is noted, with hazy bilateral airspace opacities, compatible with pulmonary edema. No pleural effusion or pneumothorax is seen. No dominant mass is identified. Upper Abdomen: The visualized portions of the liver and spleen are unremarkable, aside from an  apparent calcified granuloma within the spleen. The visualized portions of the gallbladder, pancreas, adrenal glands and stomach are within normal limits. Musculoskeletal: No acute osseous abnormalities are identified. The visualized musculature is unremarkable in appearance. Review of the MIP images confirms the above findings. IMPRESSION: 1. No evidence of significant pulmonary embolus. 2. Diffuse interstitial prominence and hazy bilateral airspace opacities are compatible with pulmonary edema. 3. Diffuse coronary artery calcifications seen. 4. Mildly enlarged mediastinal nodes measure up to 1.2 cm in short axis. Electronically Signed   By: Garald Balding M.D.   On: 11/30/2016 06:19   Dg Chest Port 1 View  Result Date: 11/30/2016 CLINICAL DATA:  Acute onset of shortness of breath and generalized chest pressure. Initial encounter. EXAM: PORTABLE CHEST 1 VIEW COMPARISON:  Chest radiograph performed 11/28/2016 FINDINGS: The lungs are well-aerated. Vascular congestion is noted. Increased interstitial markings raise concern for mild interstitial edema. There is no evidence of pleural effusion or pneumothorax. The cardiomediastinal silhouette is borderline normal in size. No acute osseous abnormalities are seen. IMPRESSION: Vascular congestion. Increased interstitial markings raise concern for mild interstitial edema. Electronically Signed   By: Garald Balding M.D.   On: 11/30/2016 05:00     Echo EF 60-65%, moderate aortic stenosis  TELEMETRY: Sinus rhythm, 61 bpm  ASSESSMENT AND PLAN:  Active Problems:   Non-STEMI (non-ST elevated myocardial infarction) ([place])   1. Acute on chronic diastolic heart failure with pulmonary edema and hypoxic respiratory failure, clinically improving.  2. Coronary artery disease, with multiple stents, troponin 0.64, 1.64, 1.60, followed by 1.23, with resolution of chest pressure, with recent cardiac catheterization 4 days ago, revealing mild to moderate CAD (mid LAD to dist  LAD lesion 60 % stenosed, mid LAD lesion 65 % stenosed, prox LAD lesion 60 % stenosed, left ventricular systolic function normal) without significant change from cardiac catheterization in 05/2016. Elevated troponin likely due to demand-supply ischemia rather than ACS. Patient has had no recurrence of chest pain.  3. Atrial fibrillation, on Eliquis for stroke prevention, rate controlled 4. Aortic stenosis, moderate per echocardiogram, of uncertain clinical significance 5. Hypertension, controlled on current BP medications 6. Type 2 diabetes 7. Hyperkalemia, resolved  Recommendations: 1. Continue current therapy 2. Continue diuresis with careful monitoring of renal status 3. Continue Eliquis for stroke prevention 4. Recommend follow-up with Dr. [name] as outpatient in 1 week.   Sign off for now; call with any questions. Clabe Seal, PA-C 12/01/2016 9:03 AM

## 2016-12-01 NOTE — Progress Notes (Signed)
Inpatient Diabetes Program Recommendations  AACE/ADA: New Consensus Statement on Inpatient Glycemic Control (2015)  Target Ranges:  Prepandial:   less than 140 mg/dL      Peak postprandial:   less than 180 mg/dL (1-2 hours)      Critically ill patients:  140 - 180 mg/dL   Lab Results  Component Value Date   GLUCAP 152 (H) 12/01/2016   HGBA1C 7.3 (H) 11/30/2016    Review of Glycemic Control   Results for ONDRA, DEBOARD (MRN [id number]) as of 12/01/2016 08:59  Ref. Range 11/30/2016 09:20 11/30/2016 11:23 11/30/2016 16:37 11/30/2016 20:16 12/01/2016 07:52  Glucose-Capillary Latest Ref Range: 65 - 99 mg/dL 271 (H) 299 (H) 259 (H) 246 (H) 152 (H)    Diabetes history: Type 2  Outpatient Diabetes medications: V-go 30 with Humolog insulin (30 units of basal and 12 units Humalog at meals), Metformin 1000mg  bid, Victoza 1.8mg  Current orders for Inpatient glycemic control: Levemir 30 units qday, Novolog 0-20 units tid/hs  MD- Patient saw his Endocrinologist (Dr. Adella Hare with [facility]) on 11/07/16. At that visit, patient was instructed to continue his current diabetes regimen at home.  Inpatient Diabetes Program Recommendations: Consider increasing Novolog to 10 units tid (hold if patient eats less than 50%),  and decrease Novolog correction to 0-9 units tid, add Novolog 0-5 units qhs  Gentry Fitz, RN, [place], [place], CDE Diabetes Coordinator Inpatient Diabetes Program  [phone] (Team Pager) [phone] ([place]) 12/01/2016 9:02 AM

## 2016-12-01 NOTE — Progress Notes (Signed)
[facility] [place] NAME: Dennis Mullen    MR#:  [id number]  DATE OF BIRTH:  11/13/1950  SUBJECTIVE:  CHIEF COMPLAINT:  Patient's shortness of breath significantly improved, off BiPAP,Now on 2 L of oxygen through nasal cannula, not on oxygen at home, feeling better denies any chest pain. Diuresed approximately 2.4 L during his hospitalization. Denies significant cough or phlegm production  REVIEW OF SYSTEMS:  CONSTITUTIONAL: No fever, fatigue or weakness.  EYES: No blurred or double vision.  EARS, NOSE, AND THROAT: No tinnitus or ear pain.  RESPIRATORY: No cough, Improved shortness of breath, denies wheezing or hemoptysis.  CARDIOVASCULAR: No chest pain, orthopnea, edema.  GASTROINTESTINAL: No nausea, vomiting, diarrhea or abdominal pain.  GENITOURINARY: No dysuria, hematuria.  ENDOCRINE: No polyuria, nocturia,  HEMATOLOGY: No anemia, easy bruising or bleeding SKIN: No rash or lesion. MUSCULOSKELETAL: No joint pain or arthritis.   NEUROLOGIC: No tingling, numbness, weakness.  PSYCHIATRY: No anxiety or depression.   DRUG ALLERGIES:  No Known Allergies  VITALS:  Blood pressure (!) 115/56, pulse (!) 58, temperature 97.7 F (36.5 C), temperature source Oral, resp. rate 18, height 6\' 2"  (1.88 m), weight 125.4 kg (276 lb 7.3 oz), SpO2 95 %.  PHYSICAL EXAMINATION:  GENERAL:  66 y.o.-year-old patient lying in the bed with no acute distress.  EYES: Pupils equal, round, reactive to light and accommodation. No scleral icterus. Extraocular muscles intact.  HEENT: Head atraumatic, normocephalic. Oropharynx and nasopharynx clear.  NECK:  Supple, no jugular venous distention. No thyroid enlargement, no tenderness.  LUNGS: Good breath sounds bilaterally, no wheezing, rales,rhonchi or crepitation. No use of accessory muscles of respiration.  CARDIOVASCULAR: S1, S2 normal. No murmurs, rubs, or gallops.  ABDOMEN: Soft, nontender, nondistended.  Bowel sounds present. No organomegaly or mass.  EXTREMITIES: No pedal edema, cyanosis, or clubbing.  NEUROLOGIC: Cranial nerves II through XII are intact. Muscle strength at baseline in all extremities. Sensation intact. Gait not checked.  PSYCHIATRIC: The patient is alert and oriented x 3.  SKIN: No obvious rash, lesion, or ulcer.    LABORATORY PANEL:   CBC  Recent Labs Lab 12/01/16 0433  WBC 7.7  HGB 10.7*  HCT 32.0*  PLT 243   ------------------------------------------------------------------------------------------------------------------  Chemistries   Recent Labs Lab 12/01/16 0433  NA 138  K 4.4  CL 103  CO2 28  GLUCOSE 142*  BUN 30*  CREATININE 1.28*  CALCIUM 9.1  MG 2.5*   ------------------------------------------------------------------------------------------------------------------  Cardiac Enzymes  Recent Labs Lab 11/30/16 2007  TROPONINI 1.23*   ------------------------------------------------------------------------------------------------------------------  RADIOLOGY:  Ct Angio Chest Pe W And/or Wo Contrast  Result Date: 11/30/2016 CLINICAL DATA:  Acute onset of shortness of breath and generalized chest pressure. Initial encounter. EXAM: CT ANGIOGRAPHY CHEST WITH CONTRAST TECHNIQUE: Multidetector CT imaging of the chest was performed using the standard protocol during bolus administration of intravenous contrast. Multiplanar CT image reconstructions and MIPs were obtained to evaluate the vascular anatomy. CONTRAST:  75 mL of Isovue 370 IV contrast COMPARISON:  Chest radiograph performed earlier today at 4:27 a.m., and CTA of the chest performed 10/10/2014 FINDINGS: Cardiovascular: There is no evidence of significant pulmonary embolus. Evaluation for pulmonary embolus is somewhat suboptimal due to motion artifact. The heart remains normal in size. Diffuse coronary artery calcifications are seen. Scattered calcification is seen along the aortic arch.  The proximal great vessels are grossly unremarkable in appearance. Mediastinum/Nodes: Mildly enlarged right paratracheal and superior mediastinal nodes measure up to 1.2 cm  in short axis. No pericardial effusion is identified. The thyroid gland is unremarkable. No axillary lymphadenopathy is seen. Lungs/Pleura: Diffuse interstitial prominence is noted, with hazy bilateral airspace opacities, compatible with pulmonary edema. No pleural effusion or pneumothorax is seen. No dominant mass is identified. Upper Abdomen: The visualized portions of the liver and spleen are unremarkable, aside from an apparent calcified granuloma within the spleen. The visualized portions of the gallbladder, pancreas, adrenal glands and stomach are within normal limits. Musculoskeletal: No acute osseous abnormalities are identified. The visualized musculature is unremarkable in appearance. Review of the MIP images confirms the above findings. IMPRESSION: 1. No evidence of significant pulmonary embolus. 2. Diffuse interstitial prominence and hazy bilateral airspace opacities are compatible with pulmonary edema. 3. Diffuse coronary artery calcifications seen. 4. Mildly enlarged mediastinal nodes measure up to 1.2 cm in short axis. Electronically Signed   By: Garald Balding M.D.   On: 11/30/2016 06:19   Dg Chest Port 1 View  Result Date: 11/30/2016 CLINICAL DATA:  Acute onset of shortness of breath and generalized chest pressure. Initial encounter. EXAM: PORTABLE CHEST 1 VIEW COMPARISON:  Chest radiograph performed 11/28/2016 FINDINGS: The lungs are well-aerated. Vascular congestion is noted. Increased interstitial markings raise concern for mild interstitial edema. There is no evidence of pleural effusion or pneumothorax. The cardiomediastinal silhouette is borderline normal in size. No acute osseous abnormalities are seen. IMPRESSION: Vascular congestion. Increased interstitial markings raise concern for mild interstitial edema.  Electronically Signed   By: Garald Balding M.D.   On: 11/30/2016 05:00    EKG:   Orders placed or performed during the hospital encounter of 11/30/16  . ED EKG  . ED EKG  . ED EKG within 10 minutes  . ED EKG within 10 minutes  . EKG 12-lead    ASSESSMENT AND PLAN:     1. Acute hypoxic respiratory failure secondary to Acute pulmonary edema from acute on chronic diastolic congestive heart failure Clinically improving, off BiPAP, now on 2 L of oxygen through nasal cannula, weaning off Continue Lasix Monitor intake and output, -2 .4 L since admission Continue aspirin, Toprol, Imdur, Lasix, statin   2. Demand ischemia with history of coronary artery disease, multiple stents Troponin 0.64-1.64, with recent cardiac catheterization 3 days ago revealing mild-to-moderate coronary artery disease with mid LAD to distal LAD lesion 60% stenosed  Now, off IV heparin Medical management for now Follow-up with cardiology as outpatient, appreciate input Continue aspirin, Plavix, Toprol, Lopid, Imdur, statin  3. Diabetes mellitus2  levemir, sliding scale insulin  4. Moderate aortic stenosis-follow-up with cardiology  5. Essential hypertension, well controlled, continue amlodipine, Lasix, Toprol, Imdur  6. Hyperkalemia, resolved on diuretic therapy, follow closely   All the records are reviewed and case discussed with Care Management/Social Workerr. Management plans discussed with the patient, family and they are in agreement.  CODE STATUS: fc   TOTAL TIME TAKING CARE OF THIS PATIENT: 35  minutes.   POSSIBLE D/C IN 3 DAYS, DEPENDING ON CLINICAL CONDITION.  Note: This dictation was prepared with Dragon dictation along with smaller phrase technology. Any transcriptional errors that result from this process are unintentional.   Cristal Howatt M.D on 12/01/2016 at 2:42 PM  Between 7am to 6pm - Pager - [phone] After 6pm go to [email or web address] - password [email or web address] [facility]  [place]  [facility]  Office  [phone]  CC: Primary care physician; Lavera Guise, MD

## 2016-12-01 NOTE — Discharge Instructions (Signed)
Heart Failure Clinic appointment on December 06 2016 at 8:20am with Darylene Price, [place]. Please call [phone] to reschedule.

## 2016-12-01 NOTE — Progress Notes (Signed)
ANTICOAGULATION CONSULT NOTE - Initial Consult  Pharmacy Consult for heparin drip management Indication: chest pain/ACS  No Known Allergies  Patient Measurements: Height: 6\' 2"  (188 cm) Weight: 276 lb 7.3 oz (125.4 kg) IBW/kg (Calculated) : 82.2 Heparin Dosing Weight: 109kg   Vital Signs: Temp: 98.3 F (36.8 C) (08/16 2309) Temp Source: Oral (08/16 2309) BP: 135/60 (08/16 2309) Pulse Rate: 59 (08/16 2309)  Labs:  Recent Labs  11/28/16 0624  11/30/16 0415 11/30/16 0841 11/30/16 1012 11/30/16 1356 11/30/16 1708 11/30/16 2007 11/30/16 2312 12/01/16 0433  HGB 11.3*  --  11.2*  --   --   --   --   --   --  10.7*  HCT 34.8*  --  33.7*  --   --   --   --   --   --  32.0*  PLT 272  --  290  --   --   --   --   --   --  243  APTT  --   --   --   --  32  --   --   --   --   --   LABPROT  --   --   --   --  14.4  --   --   --   --   --   INR  --   --   --   --  1.11  --   --   --   --   --   HEPARINUNFRC 0.39  --   --   --  <0.10*  --  0.44  --  0.37 0.44  CREATININE 1.17  --  1.49*  --   --   --   --   --   --   --   TROPONINI  --   < > 0.64* 1.64*  --  1.60*  --  1.23*  --   --   < > = values in this interval not displayed.  Estimated Creatinine Clearance: 70.5 mL/min (A) (by C-G formula based on SCr of 1.49 mg/dL (H)).   Medical History: Past Medical History:  Diagnosis Date  . Abnormal levels of other serum enzymes   . Anxiety   . Bipolar disorder ([place])   . BPH (benign prostatic hyperplasia)   . CAD (coronary artery disease)   . CHF (congestive heart failure) ([place])    ?  Marland Kitchen Depression   . Diabetes mellitus, type II ([place])   . Diabetes mellitus, type II, insulin dependent ([place])   . ED (erectile dysfunction)   . GERD (gastroesophageal reflux disease)   . Gout   . History of borderline personality disorder   . Hypertension   . Hypogonadism in male   . Hypothyroidism   . Insomnia   . Mood disorder ([place])   . Neuropathy   . Obesity   . OSA (obstructive sleep  apnea)   . PVD (peripheral vascular disease) ([place])   . Thyroid disease     Medications:  Scheduled:  . allopurinol  100 mg Oral Daily  . amLODipine  5 mg Oral QHS  . aspirin EC  81 mg Oral Daily  . clopidogrel  75 mg Oral Daily  . divalproex  500 mg Oral Daily   And  . divalproex  1,000 mg Oral QHS  . DULoxetine  60 mg Oral Daily  . fenofibrate  54 mg Oral Daily  . furosemide  40 mg Intravenous Q12H  .  gemfibrozil  600 mg Oral BID AC  . insulin aspart  0-20 Units Subcutaneous TID AC & HS  . insulin aspart  6 Units Subcutaneous TID WC  . insulin detemir  30 Units Subcutaneous Daily  . isosorbide mononitrate  60 mg Oral Daily  . levothyroxine  50 mcg Oral QAC breakfast  . mouth rinse  15 mL Mouth Rinse BID  . metoprolol succinate  50 mg Oral Daily  . pantoprazole  40 mg Oral Daily  . pregabalin  100 mg Oral TID AC  . rOPINIRole  2 mg Oral BID  . rosuvastatin  20 mg Oral QHS  . sodium chloride flush  3 mL Intravenous Q12H  . vitamin C  500 mg Oral Daily   Infusions:  . sodium chloride    . heparin 1,500 Units/hr (11/30/16 2252)    Assessment: Pharmacy consulted for heparin drip management for 66 yo male admitted with NSTEMI.   Goal of Therapy:  Heparin level 0.3-0.7 units/ml Monitor platelets by anticoagulation protocol: Yes   Plan:  Continue heparin drip at current rate and will check a confirmatory level in 6 hours.    8/17 @ 0433 HL 0.44 therapeutic. Will continue current rate and will recheck next HL w/ am labs.  Tobie Lords, PharmD, BCPS Clinical Pharmacist 12/01/2016

## 2016-12-02 DIAGNOSIS — I5033 Acute on chronic diastolic (congestive) heart failure: Secondary | ICD-10-CM

## 2016-12-02 DIAGNOSIS — J9601 Acute respiratory failure with hypoxia: Secondary | ICD-10-CM

## 2016-12-02 DIAGNOSIS — E875 Hyperkalemia: Secondary | ICD-10-CM

## 2016-12-02 DIAGNOSIS — I248 Other forms of acute ischemic heart disease: Secondary | ICD-10-CM

## 2016-12-02 DIAGNOSIS — I350 Nonrheumatic aortic (valve) stenosis: Secondary | ICD-10-CM

## 2016-12-02 LAB — BASIC METABOLIC PANEL
Anion gap: 10 (ref 5–15)
BUN: 39 mg/dL — AB (ref 6–20)
CALCIUM: 9.4 mg/dL (ref 8.9–10.3)
CO2: 28 mmol/L (ref 22–32)
CREATININE: 1.56 mg/dL — AB (ref 0.61–1.24)
Chloride: 102 mmol/L (ref 101–111)
GFR calc non Af Amer: 45 mL/min — ABNORMAL LOW (ref >60)
GFR, EST AFRICAN AMERICAN: 52 mL/min — AB (ref >60)
Glucose, Bld: 164 mg/dL — ABNORMAL HIGH (ref 65–99)
Potassium: 4.3 mmol/L (ref 3.5–5.1)
SODIUM: 140 mmol/L (ref 135–145)

## 2016-12-02 LAB — GLUCOSE, CAPILLARY
GLUCOSE-CAPILLARY: 145 mg/dL — AB (ref 65–99)
Glucose-Capillary: 204 mg/dL — ABNORMAL HIGH (ref 65–99)

## 2016-12-02 LAB — HEPARIN LEVEL (UNFRACTIONATED)

## 2016-12-02 LAB — HEMOGLOBIN: HEMOGLOBIN: 11.1 g/dL — AB (ref 13.0–18.0)

## 2016-12-02 MED ORDER — FUROSEMIDE 40 MG PO TABS
40.0000 mg | ORAL_TABLET | Freq: Every day | ORAL | Status: DC
  Administered 2016-12-02: 40 mg via ORAL
  Filled 2016-12-02: qty 1

## 2016-12-02 NOTE — Progress Notes (Signed)
Notified Dr. Estanislado Pandy of BP of 95/33. Ordered to hold IV lasix.

## 2016-12-02 NOTE — Progress Notes (Signed)
Patient is discharge home in a stable condition on room air, summary and f/u care given , verbalized understanding

## 2016-12-02 NOTE — Discharge Summary (Signed)
[facility] NAME: Dennis Mullen    MR#:  [id number]  DATE OF BIRTH:  April 27, 1950  DATE OF ADMISSION:  11/30/2016 ADMITTING PHYSICIAN: Saundra Shelling, MD  DATE OF DISCHARGE: No discharge date for patient encounter.  PRIMARY CARE PHYSICIAN: Lavera Guise, MD     ADMISSION DIAGNOSIS:  Acute pulmonary edema ([place]) [J81.0] Respiratory distress [R06.03]  DISCHARGE DIAGNOSIS:  Active Problems:   Non-STEMI (non-ST elevated myocardial infarction) (HCC)   Acute respiratory failure with hypoxia (HCC)   Acute on chronic diastolic CHF (congestive heart failure) (HCC)   Demand ischemia (HCC)   Moderate aortic stenosis   Hyperkalemia   SECONDARY DIAGNOSIS:   Past Medical History:  Diagnosis Date  . Abnormal levels of other serum enzymes   . Anxiety   . Bipolar disorder ([place])   . BPH (benign prostatic hyperplasia)   . CAD (coronary artery disease)   . CHF (congestive heart failure) ([place])    ?  Marland Kitchen Depression   . Diabetes mellitus, type II ([place])   . Diabetes mellitus, type II, insulin dependent ([place])   . ED (erectile dysfunction)   . GERD (gastroesophageal reflux disease)   . Gout   . History of borderline personality disorder   . Hypertension   . Hypogonadism in male   . Hypothyroidism   . Insomnia   . Mood disorder ([place])   . Neuropathy   . Obesity   . OSA (obstructive sleep apnea)   . PVD (peripheral vascular disease) ([place])   . Thyroid disease     .pro HOSPITAL COURSE:   Patient is 66 year old Caucasian male with past medical history significant for history of bipolar disorder, coronary artery disease, congestive heart failure, diabetes, obstructive sleep apnea, peripheral vascular disease, obesity, who presented to the hospital with shortness of breath, chest pressure, worsening for the past few days. On arrival to the hospital. Patient's oxygen saturations were low, he required nonrebreather mask. Chest x-ray  revealed vascular congestion, mild interstitial edema, CT angiogram of the chest showed no pulmonary embolism, pulmonary edema. Patient was admitted to the hospital, initiated on Lasix intravenously, oxygen therapy, and clinically improved, he diuresed approximately 4.1 L during his stay in the hospital time. He was noted to have elevated troponin to maximum 1.6,  cardiologist consultation was obtained, who felt that patient's mild elevation of troponin was demand ischemia, he recommended medical therapy. Patient was weaned off oxygen and ambulated on room air, his oxygen saturations remained stable. Discussion by problem:  1. Acute hypoxic respiratory failure secondary to acute pulmonary edema from acute on chronic diastolic congestive heart failure Clinically resolved, off  oxygen , follow oxygen saturations on room air on exertion, likely discharge home today if remain stable. Discussed with Dr. Saralyn Pilar, cardiologist, today, cardiology follow-up as outpatient is recommended Continue Lasix at 40 mg once daily dose Monitor intake and output, - 4.1 L since admission Continue aspirin, Toprol, Imdur, Lasix, statin   2. Demand ischemia with history of coronary artery disease, multiple stents Troponin 0.64-1.64, with recent cardiac catheterization 3 days ago revealing mild-to-moderate coronary artery disease with mid LAD to distal LAD lesion 60% stenosed. Continue Medical management .  Follow-up with cardiology as outpatient, appreciate input Continue aspirin, Plavix, Toprol, Lopid, Imdur, statin  3. Diabetes mellitus2  levemir, sliding scale insulin  4. Moderate aortic stenosis-follow-up with cardiology  5. Essential hypertension, well controlled, continue amlodipine, Lasix, Toprol, Imdur  6. Hyperkalemia, resolved on diuretic  therapy, follow closely as outpatient  DISCHARGE CONDITIONS:    Stable  CONSULTS OBTAINED:  Treatment Team:  Isaias Cowman, MD  DRUG ALLERGIES:  No  Known Allergies  DISCHARGE MEDICATIONS:   Current Discharge Medication List    CONTINUE these medications which have NOT CHANGED   Details  allopurinol (ZYLOPRIM) 100 MG tablet Take 100 mg by mouth daily.     amLODipine (NORVASC) 5 MG tablet Take 5 mg by mouth at bedtime.     apixaban (ELIQUIS) 5 MG TABS tablet Take 1 tablet (5 mg total) by mouth 2 (two) times daily. Qty: 60 tablet, Refills: 0    clopidogrel (PLAVIX) 75 MG tablet Take 75 mg by mouth daily. Reported on 07/26/2015    colchicine 0.6 MG tablet Take 0.6 mg by mouth 2 (two) times daily as needed (gout flare).     Continuous Blood Gluc Sensor (FREESTYLE LIBRE SENSOR SYSTEM) MISC Use 1 each as directed. Please place prior to office visit as directed    diclofenac (VOLTAREN) 75 MG EC tablet Take 75 mg by mouth daily as needed (gout flare).     divalproex (DEPAKOTE ER) 500 MG 24 hr tablet Take 1-2 tablets (500-1,000 mg total) by mouth 2 (two) times daily. Take 548m in morning and 10083min evening Qty: 270 tablet, Refills: 1    DULoxetine (CYMBALTA) 60 MG capsule Take 1 capsule (60 mg total) by mouth daily. Qty: 90 capsule, Refills: 1    esomeprazole (NEXIUM) 40 MG capsule Take 40 mg by mouth daily.     fenofibrate (TRICOR) 145 MG tablet Take 145 mg by mouth daily.     furosemide (LASIX) 40 MG tablet Take 1 tablet (40 mg total) by mouth daily. Qty: 30 tablet, Refills: 0    gemfibrozil (LOPID) 600 MG tablet gemfibrozil 600 mg tablet    glucose 4 GM chewable tablet Chew 4 g by mouth as needed for low blood sugar.     Insulin Disposable Pump (V-GO 30) KIT Inject 1.2 Units/hr into the skin every hour. 66 units/24 hrs (36 available bolus units--12 bolus units available per meals)    insulin lispro (HUMALOG KWIKPEN) 100 UNIT/ML KiwkPen Inject into the skin See admin instructions. Used ONLY as needed if additional bolus units are needed (up to 36 units/24 hrs.)    isosorbide mononitrate (IMDUR) 60 MG 24 hr tablet Take 60  mg by mouth daily.     JUBLIA 10 % SOLN Apply 1 application topically daily.    levothyroxine (SYNTHROID, LEVOTHROID) 50 MCG tablet Take 50 mcg by mouth daily before breakfast.    metFORMIN (GLUCOPHAGE) 1000 MG tablet Take 1,000 mg by mouth 2 (two) times daily with a meal. Morning & supper    metoprolol succinate (TOPROL-XL) 50 MG 24 hr tablet Take 50 mg by mouth daily.     nitroGLYCERIN (NITROSTAT) 0.4 MG SL tablet Place 0.4 mg under the tongue every 5 (five) minutes x 3 doses as needed for chest pain.     pregabalin (LYRICA) 100 MG capsule Take 100 mg by mouth 3 (three) times daily before meals.     rOPINIRole (REQUIP) 2 MG tablet Take 2 mg by mouth 2 (two) times daily.     rosuvastatin (CRESTOR) 20 MG tablet Take 20 mg by mouth at bedtime.     VICTOZA 18 MG/3ML SOPN Inject 1.8 mg into the skin daily.     vitamin C (ASCORBIC ACID) 500 MG tablet Take 500 mg by mouth daily.  DISCHARGE INSTRUCTIONS:    The patient is to follow-up with primary care physician and cardiologist as outpatient  If you experience worsening of your admission symptoms, develop shortness of breath, life threatening emergency, suicidal or homicidal thoughts you must seek medical attention immediately by calling 911 or calling your MD immediately  if symptoms less severe.  You Must read complete instructions/literature along with all the possible adverse reactions/side effects for all the Medicines you take and that have been prescribed to you. Take any new Medicines after you have completely understood and accept all the possible adverse reactions/side effects.   Please note  You were cared for by a hospitalist during your hospital stay. If you have any questions about your discharge medications or the care you received while you were in the hospital after you are discharged, you can call the unit and asked to speak with the hospitalist on call if the hospitalist that took care of you is not  available. Once you are discharged, your primary care physician will handle any further medical issues. Please note that NO REFILLS for any discharge medications will be authorized once you are discharged, as it is imperative that you return to your primary care physician (or establish a relationship with a primary care physician if you do not have one) for your aftercare needs so that they can reassess your need for medications and monitor your lab values.    Today   CHIEF COMPLAINT:   Chief Complaint  Patient presents with  . Chest Pain    HISTORY OF PRESENT ILLNESS:     VITAL SIGNS:  Blood pressure (!) 133/45, pulse (!) 56, temperature 98.1 F (36.7 C), temperature source Oral, resp. rate 18, height _0  (1.88 m), weight 125.4 kg (276 lb 7.3 oz), SpO2 95 %.  I/O:   Intake/Output Summary (Last 24 hours) at 12/02/16 1208 Last data filed at 12/02/16 0900  Gross per 24 hour  Intake              480 ml  Output             2225 ml  Net            -1745 ml    PHYSICAL EXAMINATION:  GENERAL:  66 y.o.-year-old patient lying in the bed with no acute distress.  EYES: Pupils equal, round, reactive to light and accommodation. No scleral icterus. Extraocular muscles intact.  HEENT: Head atraumatic, normocephalic. Oropharynx and nasopharynx clear.  NECK:  Supple, no jugular venous distention. No thyroid enlargement, no tenderness.  LUNGS: Normal breath sounds bilaterally, no wheezing, rales,rhonchi or crepitation. No use of accessory muscles of respiration.  CARDIOVASCULAR: S1, S2 normal. No murmurs, rubs, or gallops.  ABDOMEN: Soft, non-tender, non-distended. Bowel sounds present. No organomegaly or mass.  EXTREMITIES: No pedal edema, cyanosis, or clubbing.  NEUROLOGIC: Cranial nerves II through XII are intact. Muscle strength 5/5 in all extremities. Sensation intact. Gait not checked.  PSYCHIATRIC: The patient is alert and oriented x 3.  SKIN: No obvious rash, lesion, or ulcer.    DATA REVIEW:   CBC  Recent Labs Lab 12/01/16 0433 12/02/16 0446  WBC 7.7  --   HGB 10.7* 11.1*  HCT 32.0*  --   PLT 243  --     Chemistries   Recent Labs Lab 12/01/16 0433 12/02/16 0446  NA 138 140  K 4.4 4.3  CL 103 102  CO2 28 28  GLUCOSE 142* 164*  BUN 30* 39*  CREATININE  1.28* 1.56*  CALCIUM 9.1 9.4  MG 2.5*  --     Cardiac Enzymes  Recent Labs Lab 11/30/16 2007  TROPONINI 1.23*    Microbiology Results  Results for orders placed or performed during the hospital encounter of 11/30/16  MRSA PCR Screening     Status: None   Collection Time: 11/30/16  9:06 AM  Result Value Ref Range Status   MRSA by PCR NEGATIVE NEGATIVE Final    Comment:        The GeneXpert MRSA Assay (FDA approved for NASAL specimens only), is one component of a comprehensive MRSA colonization surveillance program. It is not intended to diagnose MRSA infection nor to guide or monitor treatment for MRSA infections.     RADIOLOGY:  No results found.  EKG:   Orders placed or performed during the hospital encounter of 11/30/16  . ED EKG  . ED EKG  . ED EKG within 10 minutes  . ED EKG within 10 minutes  . EKG 12-lead      Management plans discussed with the patient, family and they are in agreement.  CODE STATUS:     Code Status Orders        Start     Ordered   11/30/16 0808  Full code  Continuous     11/30/16 0807    Code Status History    Date Active Date Inactive Code Status Order ID Comments User Context   11/27/2016 11:03 AM 11/28/2016  9:35 PM Full Code [id number]  Vaughan Basta, MD Inpatient   11/03/2016  3:31 PM 11/04/2016  8:36 PM Full Code [id number]  Demetrios Loll, MD Inpatient   08/25/2016 11:42 AM 08/25/2016  6:20 PM Full Code [id number]  Sharlotte Alamo, DPM Inpatient   05/31/2016 12:18 PM 06/02/2016  4:31 PM Full Code [id number]  Gladstone Lighter, MD ED   03/12/2016  5:57 PM 03/13/2016  7:52 PM Full Code [id number]  Nicholes Mango, MD Inpatient    02/02/2016  1:37 AM 02/03/2016 10:08 PM Full Code [id number]  Holley Raring, NP ED   10/11/2015 12:44 PM 10/12/2015  5:30 PM Full Code [id number]  Vaughan Basta, MD Inpatient   09/24/2015  3:12 AM 09/25/2015  3:06 PM Full Code [id number]  Saundra Shelling, MD Inpatient   11/14/2014  5:07 AM 11/15/2014  4:53 PM Full Code [id number]  Harrie Foreman, MD ED    Advance Directive Documentation     Most Recent Value  Type of Advance Directive  Living will  Pre-existing out of facility DNR order (yellow form or pink MOST form)  -  "MOST" Form in Place?  -      TOTAL TIME TAKING CARE OF THIS PATIENT: 40 minutes.    Theodoro Grist M.D on 12/02/2016 at 12:08 PM  Between 7am to 6pm - Pager - [phone]  After 6pm go to [email or web address] - password [email or web address] [facility]  [facility]  Office  [phone]  CC: Primary care physician; Lavera Guise, MD

## 2016-12-06 ENCOUNTER — Ambulatory Visit: Payer: Self-pay | Admitting: Family

## 2016-12-06 ENCOUNTER — Telehealth: Payer: Self-pay | Admitting: Family

## 2016-12-06 NOTE — Telephone Encounter (Signed)
Patient missed his initial appointment at the [facility] on 12/06/16. Will attempt to reschedule.

## 2016-12-07 ENCOUNTER — Emergency Department: Payer: Medicare Other

## 2016-12-07 ENCOUNTER — Inpatient Hospital Stay
Admission: EM | Admit: 2016-12-07 | Discharge: 2016-12-13 | DRG: 280 | Disposition: A | Discharge status: Home / Self Care | Payer: Medicare Other | Attending: Internal Medicine | Admitting: Internal Medicine

## 2016-12-07 ENCOUNTER — Emergency Department
Admission: EM | Admit: 2016-12-07 | Discharge: 2016-12-07 | Disposition: A | Discharge status: Home / Self Care | Payer: Medicare Other | Source: Home / Self Care | Attending: Emergency Medicine | Admitting: Emergency Medicine

## 2016-12-07 ENCOUNTER — Encounter: Payer: Self-pay | Admitting: Emergency Medicine

## 2016-12-07 ENCOUNTER — Encounter
Admission: EM | Disposition: A | Discharge status: Home / Self Care | Payer: Self-pay | Source: Home / Self Care | Attending: Internal Medicine

## 2016-12-07 DIAGNOSIS — Z955 Presence of coronary angioplasty implant and graft: Secondary | ICD-10-CM | POA: Diagnosis not present

## 2016-12-07 DIAGNOSIS — I214 Non-ST elevation (NSTEMI) myocardial infarction: Principal | ICD-10-CM | POA: Diagnosis present

## 2016-12-07 DIAGNOSIS — E039 Hypothyroidism, unspecified: Secondary | ICD-10-CM

## 2016-12-07 DIAGNOSIS — Z794 Long term (current) use of insulin: Secondary | ICD-10-CM

## 2016-12-07 DIAGNOSIS — Z801 Family history of malignant neoplasm of trachea, bronchus and lung: Secondary | ICD-10-CM

## 2016-12-07 DIAGNOSIS — I472 Ventricular tachycardia: Secondary | ICD-10-CM | POA: Diagnosis not present

## 2016-12-07 DIAGNOSIS — I5033 Acute on chronic diastolic (congestive) heart failure: Secondary | ICD-10-CM | POA: Insufficient documentation

## 2016-12-07 DIAGNOSIS — G4733 Obstructive sleep apnea (adult) (pediatric): Secondary | ICD-10-CM | POA: Diagnosis present

## 2016-12-07 DIAGNOSIS — I4901 Ventricular fibrillation: Secondary | ICD-10-CM | POA: Diagnosis not present

## 2016-12-07 DIAGNOSIS — Z79899 Other long term (current) drug therapy: Secondary | ICD-10-CM | POA: Insufficient documentation

## 2016-12-07 DIAGNOSIS — I5023 Acute on chronic systolic (congestive) heart failure: Secondary | ICD-10-CM | POA: Diagnosis not present

## 2016-12-07 DIAGNOSIS — N179 Acute kidney failure, unspecified: Secondary | ICD-10-CM | POA: Diagnosis not present

## 2016-12-07 DIAGNOSIS — Z8249 Family history of ischemic heart disease and other diseases of the circulatory system: Secondary | ICD-10-CM

## 2016-12-07 DIAGNOSIS — Z8673 Personal history of transient ischemic attack (TIA), and cerebral infarction without residual deficits: Secondary | ICD-10-CM | POA: Insufficient documentation

## 2016-12-07 DIAGNOSIS — I4891 Unspecified atrial fibrillation: Secondary | ICD-10-CM | POA: Diagnosis not present

## 2016-12-07 DIAGNOSIS — E1165 Type 2 diabetes mellitus with hyperglycemia: Secondary | ICD-10-CM | POA: Diagnosis not present

## 2016-12-07 DIAGNOSIS — Z818 Family history of other mental and behavioral disorders: Secondary | ICD-10-CM

## 2016-12-07 DIAGNOSIS — R748 Abnormal levels of other serum enzymes: Secondary | ICD-10-CM | POA: Diagnosis not present

## 2016-12-07 DIAGNOSIS — Z7901 Long term (current) use of anticoagulants: Secondary | ICD-10-CM

## 2016-12-07 DIAGNOSIS — I5043 Acute on chronic combined systolic (congestive) and diastolic (congestive) heart failure: Secondary | ICD-10-CM | POA: Diagnosis present

## 2016-12-07 DIAGNOSIS — I469 Cardiac arrest, cause unspecified: Secondary | ICD-10-CM

## 2016-12-07 DIAGNOSIS — E1122 Type 2 diabetes mellitus with diabetic chronic kidney disease: Secondary | ICD-10-CM | POA: Diagnosis not present

## 2016-12-07 DIAGNOSIS — Z7189 Other specified counseling: Secondary | ICD-10-CM | POA: Diagnosis not present

## 2016-12-07 DIAGNOSIS — R0600 Dyspnea, unspecified: Secondary | ICD-10-CM | POA: Diagnosis not present

## 2016-12-07 DIAGNOSIS — Z9911 Dependence on respirator [ventilator] status: Secondary | ICD-10-CM | POA: Diagnosis not present

## 2016-12-07 DIAGNOSIS — I10 Essential (primary) hypertension: Secondary | ICD-10-CM

## 2016-12-07 DIAGNOSIS — E785 Hyperlipidemia, unspecified: Secondary | ICD-10-CM | POA: Diagnosis not present

## 2016-12-07 DIAGNOSIS — Z9641 Presence of insulin pump (external) (internal): Secondary | ICD-10-CM

## 2016-12-07 DIAGNOSIS — Z515 Encounter for palliative care: Secondary | ICD-10-CM | POA: Diagnosis not present

## 2016-12-07 DIAGNOSIS — Z6836 Body mass index (BMI) 36.0-36.9, adult: Secondary | ICD-10-CM

## 2016-12-07 DIAGNOSIS — N170 Acute kidney failure with tubular necrosis: Secondary | ICD-10-CM | POA: Diagnosis not present

## 2016-12-07 DIAGNOSIS — I4892 Unspecified atrial flutter: Secondary | ICD-10-CM

## 2016-12-07 DIAGNOSIS — J9602 Acute respiratory failure with hypercapnia: Secondary | ICD-10-CM | POA: Diagnosis not present

## 2016-12-07 DIAGNOSIS — I480 Paroxysmal atrial fibrillation: Secondary | ICD-10-CM | POA: Diagnosis not present

## 2016-12-07 DIAGNOSIS — N183 Chronic kidney disease, stage 3 (moderate): Secondary | ICD-10-CM | POA: Diagnosis not present

## 2016-12-07 DIAGNOSIS — I130 Hypertensive heart and chronic kidney disease with heart failure and stage 1 through stage 4 chronic kidney disease, or unspecified chronic kidney disease: Secondary | ICD-10-CM | POA: Diagnosis present

## 2016-12-07 DIAGNOSIS — Z89422 Acquired absence of other left toe(s): Secondary | ICD-10-CM | POA: Diagnosis not present

## 2016-12-07 DIAGNOSIS — I462 Cardiac arrest due to underlying cardiac condition: Secondary | ICD-10-CM | POA: Diagnosis not present

## 2016-12-07 DIAGNOSIS — E669 Obesity, unspecified: Secondary | ICD-10-CM | POA: Diagnosis present

## 2016-12-07 DIAGNOSIS — I517 Cardiomegaly: Secondary | ICD-10-CM | POA: Diagnosis not present

## 2016-12-07 DIAGNOSIS — Z7902 Long term (current) use of antithrombotics/antiplatelets: Secondary | ICD-10-CM

## 2016-12-07 DIAGNOSIS — J9601 Acute respiratory failure with hypoxia: Secondary | ICD-10-CM

## 2016-12-07 DIAGNOSIS — I959 Hypotension, unspecified: Secondary | ICD-10-CM | POA: Diagnosis present

## 2016-12-07 DIAGNOSIS — I110 Hypertensive heart disease with heart failure: Secondary | ICD-10-CM

## 2016-12-07 DIAGNOSIS — E1151 Type 2 diabetes mellitus with diabetic peripheral angiopathy without gangrene: Secondary | ICD-10-CM | POA: Diagnosis present

## 2016-12-07 DIAGNOSIS — J810 Acute pulmonary edema: Secondary | ICD-10-CM

## 2016-12-07 DIAGNOSIS — F319 Bipolar disorder, unspecified: Secondary | ICD-10-CM | POA: Diagnosis present

## 2016-12-07 DIAGNOSIS — I509 Heart failure, unspecified: Secondary | ICD-10-CM | POA: Diagnosis not present

## 2016-12-07 DIAGNOSIS — R0602 Shortness of breath: Secondary | ICD-10-CM | POA: Diagnosis not present

## 2016-12-07 DIAGNOSIS — I350 Nonrheumatic aortic (valve) stenosis: Secondary | ICD-10-CM | POA: Diagnosis present

## 2016-12-07 DIAGNOSIS — I2510 Atherosclerotic heart disease of native coronary artery without angina pectoris: Secondary | ICD-10-CM | POA: Diagnosis present

## 2016-12-07 DIAGNOSIS — K219 Gastro-esophageal reflux disease without esophagitis: Secondary | ICD-10-CM | POA: Diagnosis present

## 2016-12-07 DIAGNOSIS — R0789 Other chest pain: Secondary | ICD-10-CM | POA: Diagnosis not present

## 2016-12-07 DIAGNOSIS — R9431 Abnormal electrocardiogram [ECG] [EKG]: Secondary | ICD-10-CM | POA: Diagnosis not present

## 2016-12-07 DIAGNOSIS — E119 Type 2 diabetes mellitus without complications: Secondary | ICD-10-CM

## 2016-12-07 DIAGNOSIS — R000 Tachycardia, unspecified: Secondary | ICD-10-CM | POA: Diagnosis not present

## 2016-12-07 DIAGNOSIS — R079 Chest pain, unspecified: Secondary | ICD-10-CM

## 2016-12-07 LAB — CBC WITH DIFFERENTIAL/PLATELET
BASOS ABS: 0.1 10*3/uL (ref 0–0.1)
BASOS PCT: 1 %
Basophils Absolute: 0.1 10*3/uL (ref 0–0.1)
Basophils Relative: 1 %
EOS ABS: 0.2 10*3/uL (ref 0–0.7)
EOS PCT: 3 %
Eosinophils Absolute: 0.1 10*3/uL (ref 0–0.7)
Eosinophils Relative: 2 %
HCT: 31 % — ABNORMAL LOW (ref 40.0–52.0)
HEMATOCRIT: 34.2 % — AB (ref 40.0–52.0)
Hemoglobin: 10.5 g/dL — ABNORMAL LOW (ref 13.0–18.0)
Hemoglobin: 11 g/dL — ABNORMAL LOW (ref 13.0–18.0)
LYMPHS ABS: 1.9 10*3/uL (ref 1.0–3.6)
LYMPHS PCT: 19 %
Lymphocytes Relative: 20 %
Lymphs Abs: 1.8 10*3/uL (ref 1.0–3.6)
MCH: 29.2 pg (ref 26.0–34.0)
MCH: 27.9 pg (ref 26.0–34.0)
MCHC: 33.8 g/dL (ref 32.0–36.0)
MCHC: 32.1 g/dL (ref 32.0–36.0)
MCV: 86.5 fL (ref 80.0–100.0)
MCV: 86.8 fL (ref 80.0–100.0)
MONO ABS: 1 10*3/uL (ref 0.2–1.0)
MONO ABS: 0.9 10*3/uL (ref 0.2–1.0)
MONOS PCT: 11 %
Monocytes Relative: 10 %
NEUTROS ABS: 6.2 10*3/uL (ref 1.4–6.5)
Neutro Abs: 6.6 10*3/uL — ABNORMAL HIGH (ref 1.4–6.5)
Neutrophils Relative %: 66 %
Neutrophils Relative %: 67 %
PLATELETS: 324 10*3/uL (ref 150–440)
PLATELETS: 345 10*3/uL (ref 150–440)
RBC: 3.58 MIL/uL — ABNORMAL LOW (ref 4.40–5.90)
RBC: 3.94 MIL/uL — AB (ref 4.40–5.90)
RDW: 18.2 % — AB (ref 11.5–14.5)
RDW: 18.3 % — AB (ref 11.5–14.5)
WBC: 9.8 10*3/uL (ref 3.8–10.6)
WBC: 9.1 10*3/uL (ref 3.8–10.6)

## 2016-12-07 LAB — BASIC METABOLIC PANEL
Anion gap: 6 (ref 5–15)
BUN: 31 mg/dL — AB (ref 6–20)
CO2: 26 mmol/L (ref 22–32)
CREATININE: 1.42 mg/dL — AB (ref 0.61–1.24)
Calcium: 9.3 mg/dL (ref 8.9–10.3)
Chloride: 105 mmol/L (ref 101–111)
GFR calc Af Amer: 59 mL/min — ABNORMAL LOW (ref >60)
GFR, EST NON AFRICAN AMERICAN: 51 mL/min — AB (ref >60)
GLUCOSE: 361 mg/dL — AB (ref 65–99)
POTASSIUM: 4.8 mmol/L (ref 3.5–5.1)
SODIUM: 137 mmol/L (ref 135–145)

## 2016-12-07 LAB — TROPONIN I
Troponin I: 1.77 ng/mL (ref <0.03)
Troponin I: 2.69 ng/mL (ref <0.03)
Troponin I: 1.2 ng/mL (ref <0.03)
Troponin I: 6.51 ng/mL (ref <0.03)

## 2016-12-07 LAB — COMPREHENSIVE METABOLIC PANEL
ALBUMIN: 3.6 g/dL (ref 3.5–5.0)
ALT: 17 U/L (ref 17–63)
AST: 31 U/L (ref 15–41)
Alkaline Phosphatase: 69 U/L (ref 38–126)
Anion gap: 14 (ref 5–15)
BUN: 32 mg/dL — AB (ref 6–20)
CHLORIDE: 97 mmol/L — AB (ref 101–111)
CO2: 24 mmol/L (ref 22–32)
Calcium: 9.6 mg/dL (ref 8.9–10.3)
Creatinine, Ser: 1.63 mg/dL — ABNORMAL HIGH (ref 0.61–1.24)
GFR calc Af Amer: 50 mL/min — ABNORMAL LOW (ref >60)
GFR calc non Af Amer: 43 mL/min — ABNORMAL LOW (ref >60)
GLUCOSE: 494 mg/dL — AB (ref 65–99)
POTASSIUM: 4.5 mmol/L (ref 3.5–5.1)
Sodium: 135 mmol/L (ref 135–145)
Total Bilirubin: 0.8 mg/dL (ref 0.3–1.2)
Total Protein: 8 g/dL (ref 6.5–8.1)

## 2016-12-07 LAB — LIPID PANEL
CHOL/HDL RATIO: 5.4 ratio
Cholesterol: 183 mg/dL (ref 0–200)
HDL: 34 mg/dL — AB (ref >40)
LDL CALC: 74 mg/dL (ref 0–99)
Triglycerides: 377 mg/dL — ABNORMAL HIGH (ref <150)
VLDL: 75 mg/dL — ABNORMAL HIGH (ref 0–40)

## 2016-12-07 LAB — HEPARIN LEVEL (UNFRACTIONATED): Heparin Unfractionated: 1.04 IU/mL — ABNORMAL HIGH (ref 0.30–0.70)

## 2016-12-07 LAB — PROTIME-INR
INR: 1.2
Prothrombin Time: 15.3 seconds — ABNORMAL HIGH (ref 11.4–15.2)

## 2016-12-07 LAB — MRSA PCR SCREENING: MRSA by PCR: NEGATIVE

## 2016-12-07 LAB — BRAIN NATRIURETIC PEPTIDE: B Natriuretic Peptide: 990 pg/mL — ABNORMAL HIGH (ref 0.0–100.0)

## 2016-12-07 LAB — GLUCOSE, CAPILLARY
GLUCOSE-CAPILLARY: 470 mg/dL — AB (ref 65–99)
GLUCOSE-CAPILLARY: 419 mg/dL — AB (ref 65–99)
GLUCOSE-CAPILLARY: 422 mg/dL — AB (ref 65–99)
Glucose-Capillary: 475 mg/dL — ABNORMAL HIGH (ref 65–99)

## 2016-12-07 LAB — APTT: aPTT: 69 seconds — ABNORMAL HIGH (ref 24–36)

## 2016-12-07 SURGERY — LEFT HEART CATH AND CORONARY ANGIOGRAPHY
Anesthesia: Moderate Sedation

## 2016-12-07 MED ORDER — DULOXETINE HCL 30 MG PO CPEP
60.0000 mg | ORAL_CAPSULE | Freq: Every day | ORAL | Status: DC
  Administered 2016-12-08 – 2016-12-13 (×6): 60 mg via ORAL
  Filled 2016-12-07 (×6): qty 2

## 2016-12-07 MED ORDER — AMIODARONE HCL IN DEXTROSE 360-4.14 MG/200ML-% IV SOLN
60.0000 mg/h | INTRAVENOUS | Status: AC
  Administered 2016-12-07: 60 mg/h via INTRAVENOUS
  Filled 2016-12-07: qty 200

## 2016-12-07 MED ORDER — ACETAMINOPHEN 325 MG PO TABS: 650.0000 mg | ORAL_TABLET | Freq: Four times a day (QID) | ORAL | Status: DC | PRN

## 2016-12-07 MED ORDER — SODIUM CHLORIDE 0.9 % IV SOLN
INTRAVENOUS | Status: DC
  Administered 2016-12-07: 1000 mL via INTRAVENOUS

## 2016-12-07 MED ORDER — FUROSEMIDE 40 MG PO TABS: 40.0000 mg | ORAL_TABLET | Freq: Every day | ORAL | 0 refills | Status: AC

## 2016-12-07 MED ORDER — PANTOPRAZOLE SODIUM 40 MG PO TBEC
40.0000 mg | DELAYED_RELEASE_TABLET | Freq: Every day | ORAL | Status: DC
Start: 2016-12-08 — End: 2016-12-13
  Administered 2016-12-08 – 2016-12-13 (×6): 40 mg via ORAL
  Filled 2016-12-07 (×7): qty 1

## 2016-12-07 MED ORDER — FENTANYL CITRATE (PF) 100 MCG/2ML IJ SOLN
100.0000 ug | Freq: Once | INTRAMUSCULAR | Status: AC
Start: 2016-12-07 — End: 2016-12-07
  Administered 2016-12-07: 100 ug via INTRAVENOUS

## 2016-12-07 MED ORDER — AMIODARONE IV BOLUS ONLY 150 MG/100ML
INTRAVENOUS | Status: AC
  Filled 2016-12-07: qty 100

## 2016-12-07 MED ORDER — HEPARIN (PORCINE) IN NACL 100-0.45 UNIT/ML-% IJ SOLN
1550.0000 [IU]/h | INTRAMUSCULAR | Status: DC
  Administered 2016-12-07: 1450 [IU]/h via INTRAVENOUS
  Administered 2016-12-08 – 2016-12-10 (×3): 1350 [IU]/h via INTRAVENOUS
  Administered 2016-12-10 – 2016-12-12 (×3): 1550 [IU]/h via INTRAVENOUS
  Filled 2016-12-07 (×8): qty 250

## 2016-12-07 MED ORDER — FUROSEMIDE 10 MG/ML IJ SOLN
40.0000 mg | Freq: Once | INTRAMUSCULAR | Status: AC
  Administered 2016-12-07: 40 mg via INTRAVENOUS
  Filled 2016-12-07: qty 4

## 2016-12-07 MED ORDER — PREGABALIN 50 MG PO CAPS
100.0000 mg | ORAL_CAPSULE | Freq: Three times a day (TID) | ORAL | Status: DC
  Administered 2016-12-08 – 2016-12-13 (×15): 100 mg via ORAL
  Filled 2016-12-07: qty 2
  Filled 2016-12-07: qty 4
  Filled 2016-12-07 (×14): qty 2

## 2016-12-07 MED ORDER — DEXTROSE 5 % IV SOLN
5.0000 mg/h | INTRAVENOUS | Status: DC
  Administered 2016-12-07: 5 mg/h via INTRAVENOUS
  Administered 2016-12-07: 10 mg/h via INTRAVENOUS
  Administered 2016-12-07: 15 mg/h via INTRAVENOUS
  Filled 2016-12-07: qty 100

## 2016-12-07 MED ORDER — DICLOFENAC SODIUM 75 MG PO TBEC
75.0000 mg | DELAYED_RELEASE_TABLET | Freq: Every day | ORAL | Status: DC | PRN
  Filled 2016-12-07: qty 1

## 2016-12-07 MED ORDER — INSULIN ASPART 100 UNIT/ML ~~LOC~~ SOLN
0.0000 [IU] | Freq: Every day | SUBCUTANEOUS | Status: DC
  Administered 2016-12-07: 5 [IU] via SUBCUTANEOUS
  Filled 2016-12-07: qty 1

## 2016-12-07 MED ORDER — NITROGLYCERIN 0.4 MG SL SUBL: 0.4000 mg | SUBLINGUAL_TABLET | SUBLINGUAL | Status: DC | PRN

## 2016-12-07 MED ORDER — SODIUM CHLORIDE 0.9% FLUSH
3.0000 mL | Freq: Two times a day (BID) | INTRAVENOUS | Status: DC
  Administered 2016-12-08 – 2016-12-13 (×9): 3 mL via INTRAVENOUS

## 2016-12-07 MED ORDER — DILTIAZEM HCL 60 MG PO TABS
60.0000 mg | ORAL_TABLET | Freq: Once | ORAL | Status: AC
  Administered 2016-12-07: 60 mg via ORAL
  Filled 2016-12-07: qty 1

## 2016-12-07 MED ORDER — LIDOCAINE HCL (PF) 1 % IJ SOLN
INTRAMUSCULAR | Status: AC
  Filled 2016-12-07: qty 30

## 2016-12-07 MED ORDER — AMIODARONE LOAD VIA INFUSION
150.0000 mg | Freq: Once | INTRAVENOUS | Status: AC
  Administered 2016-12-07: 150 mg via INTRAVENOUS

## 2016-12-07 MED ORDER — SODIUM CHLORIDE 0.9 % IV SOLN: 250.0000 mL | INTRAVENOUS | Status: DC | PRN

## 2016-12-07 MED ORDER — SODIUM CHLORIDE 0.9 % IV SOLN
INTRAVENOUS | Status: DC
  Administered 2016-12-07: 4.6 [IU]/h via INTRAVENOUS
  Filled 2016-12-07: qty 1

## 2016-12-07 MED ORDER — DIVALPROEX SODIUM ER 500 MG PO TB24: 500.0000 mg | ORAL_TABLET | Freq: Two times a day (BID) | ORAL | Status: DC

## 2016-12-07 MED ORDER — LEVOTHYROXINE SODIUM 50 MCG PO TABS
50.0000 ug | ORAL_TABLET | Freq: Every day | ORAL | Status: DC
  Administered 2016-12-08 – 2016-12-13 (×6): 50 ug via ORAL
  Filled 2016-12-07 (×6): qty 1

## 2016-12-07 MED ORDER — DIVALPROEX SODIUM ER 500 MG PO TB24
500.0000 mg | ORAL_TABLET | Freq: Every day | ORAL | Status: DC
  Administered 2016-12-08 – 2016-12-13 (×6): 500 mg via ORAL
  Filled 2016-12-07 (×6): qty 1

## 2016-12-07 MED ORDER — SODIUM CHLORIDE 0.9% FLUSH: 3.0000 mL | INTRAVENOUS | Status: DC | PRN

## 2016-12-07 MED ORDER — COLCHICINE 0.6 MG PO TABS
0.6000 mg | ORAL_TABLET | Freq: Two times a day (BID) | ORAL | Status: DC | PRN
  Filled 2016-12-07: qty 1

## 2016-12-07 MED ORDER — V-GO 30 KIT: 1.2000 [IU]/h | PACK | Status: DC

## 2016-12-07 MED ORDER — INSULIN ASPART 100 UNIT/ML ~~LOC~~ SOLN
0.0000 [IU] | Freq: Three times a day (TID) | SUBCUTANEOUS | Status: DC
  Administered 2016-12-08: 2 [IU] via SUBCUTANEOUS
  Administered 2016-12-08: 5 [IU] via SUBCUTANEOUS
  Filled 2016-12-07 (×2): qty 1

## 2016-12-07 MED ORDER — HEPARIN BOLUS VIA INFUSION
4000.0000 [IU] | Freq: Once | INTRAVENOUS | Status: AC
  Administered 2016-12-07: 4000 [IU] via INTRAVENOUS
  Filled 2016-12-07: qty 4000

## 2016-12-07 MED ORDER — AMIODARONE HCL IN DEXTROSE 360-4.14 MG/200ML-% IV SOLN
INTRAVENOUS | Status: AC
  Filled 2016-12-07: qty 200

## 2016-12-07 MED ORDER — LIRAGLUTIDE 18 MG/3ML ~~LOC~~ SOPN: 1.8000 mg | PEN_INJECTOR | Freq: Every day | SUBCUTANEOUS | Status: DC

## 2016-12-07 MED ORDER — ISOSORBIDE MONONITRATE ER 60 MG PO TB24
60.0000 mg | ORAL_TABLET | Freq: Every day | ORAL | Status: DC
  Administered 2016-12-08 – 2016-12-11 (×4): 60 mg via ORAL
  Filled 2016-12-07 (×4): qty 2

## 2016-12-07 MED ORDER — VITAMIN C 500 MG PO TABS
500.0000 mg | ORAL_TABLET | Freq: Every day | ORAL | Status: DC
  Administered 2016-12-08 – 2016-12-13 (×6): 500 mg via ORAL
  Filled 2016-12-07 (×6): qty 1

## 2016-12-07 MED ORDER — METOPROLOL SUCCINATE ER 50 MG PO TB24
50.0000 mg | ORAL_TABLET | Freq: Every day | ORAL | Status: DC
  Administered 2016-12-08 – 2016-12-13 (×5): 50 mg via ORAL
  Filled 2016-12-07 (×5): qty 1

## 2016-12-07 MED ORDER — DEXTROSE-NACL 5-0.45 % IV SOLN: INTRAVENOUS | Status: DC

## 2016-12-07 MED ORDER — DEXTROSE 5 % IV SOLN: 60.0000 mg/h | Freq: Once | INTRAVENOUS | Status: DC

## 2016-12-07 MED ORDER — AMIODARONE HCL IN DEXTROSE 360-4.14 MG/200ML-% IV SOLN: 30.0000 mg/h | INTRAVENOUS | Status: DC

## 2016-12-07 MED ORDER — DILTIAZEM HCL 25 MG/5ML IV SOLN
20.0000 mg | Freq: Once | INTRAVENOUS | Status: AC
  Administered 2016-12-07: 20 mg via INTRAVENOUS
  Filled 2016-12-07: qty 5

## 2016-12-07 MED ORDER — CLOPIDOGREL BISULFATE 75 MG PO TABS
75.0000 mg | ORAL_TABLET | Freq: Every day | ORAL | Status: DC
  Administered 2016-12-08 – 2016-12-13 (×6): 75 mg via ORAL
  Filled 2016-12-07 (×6): qty 1

## 2016-12-07 MED ORDER — GLUCOSE 4 G PO CHEW: 4.0000 g | CHEWABLE_TABLET | ORAL | Status: DC | PRN

## 2016-12-07 MED ORDER — ENALAPRILAT 1.25 MG/ML IV SOLN
1.2500 mg | Freq: Once | INTRAVENOUS | Status: AC
  Administered 2016-12-07: 1.25 mg via INTRAVENOUS
  Filled 2016-12-07: qty 2

## 2016-12-07 MED ORDER — FUROSEMIDE 40 MG PO TABS
40.0000 mg | ORAL_TABLET | Freq: Every day | ORAL | Status: DC
  Administered 2016-12-08 – 2016-12-13 (×6): 40 mg via ORAL
  Filled 2016-12-07 (×2): qty 1
  Filled 2016-12-07 (×4): qty 2

## 2016-12-07 MED ORDER — FENOFIBRATE 54 MG PO TABS
54.0000 mg | ORAL_TABLET | Freq: Every day | ORAL | Status: DC
  Administered 2016-12-08 – 2016-12-13 (×6): 54 mg via ORAL
  Filled 2016-12-07 (×6): qty 1

## 2016-12-07 MED ORDER — HEPARIN SODIUM (PORCINE) 5000 UNIT/ML IJ SOLN
4000.0000 [IU] | Freq: Once | INTRAMUSCULAR | Status: AC
  Administered 2016-12-07: 4000 [IU] via INTRAVENOUS

## 2016-12-07 MED ORDER — AMIODARONE HCL IN DEXTROSE 360-4.14 MG/200ML-% IV SOLN
30.0000 mg/h | INTRAVENOUS | Status: DC
  Administered 2016-12-07: 30 mg/h via INTRAVENOUS

## 2016-12-07 MED ORDER — GEMFIBROZIL 600 MG PO TABS: 600.0000 mg | ORAL_TABLET | Freq: Two times a day (BID) | ORAL | Status: DC

## 2016-12-07 MED ORDER — INSULIN ASPART 100 UNIT/ML ~~LOC~~ SOLN
15.0000 [IU] | Freq: Once | SUBCUTANEOUS | Status: AC
  Administered 2016-12-07: 15 [IU] via SUBCUTANEOUS
  Filled 2016-12-07: qty 1

## 2016-12-07 MED ORDER — ROPINIROLE HCL 1 MG PO TABS
2.0000 mg | ORAL_TABLET | Freq: Two times a day (BID) | ORAL | Status: DC
Start: 2016-12-07 — End: 2016-12-13
  Administered 2016-12-07 – 2016-12-13 (×12): 2 mg via ORAL
  Filled 2016-12-07 (×14): qty 2

## 2016-12-07 MED ORDER — NITROGLYCERIN 5 MG/ML IV SOLN
INTRAVENOUS | Status: AC
  Filled 2016-12-07: qty 10

## 2016-12-07 MED ORDER — ACETAMINOPHEN 650 MG RE SUPP: 650.0000 mg | Freq: Four times a day (QID) | RECTAL | Status: DC | PRN

## 2016-12-07 MED ORDER — ALLOPURINOL 100 MG PO TABS
100.0000 mg | ORAL_TABLET | Freq: Every day | ORAL | Status: DC
  Administered 2016-12-08 – 2016-12-13 (×6): 100 mg via ORAL
  Filled 2016-12-07 (×6): qty 1

## 2016-12-07 MED ORDER — DIVALPROEX SODIUM ER 500 MG PO TB24
1000.0000 mg | ORAL_TABLET | Freq: Every day | ORAL | Status: DC
Start: 2016-12-07 — End: 2016-12-13
  Administered 2016-12-07 – 2016-12-12 (×6): 1000 mg via ORAL
  Filled 2016-12-07 (×8): qty 2

## 2016-12-07 MED ORDER — FENTANYL CITRATE (PF) 100 MCG/2ML IJ SOLN
INTRAMUSCULAR | Status: AC
  Administered 2016-12-07: 100 ug via INTRAVENOUS
  Filled 2016-12-07: qty 2

## 2016-12-07 MED ORDER — AMLODIPINE BESYLATE 5 MG PO TABS
5.0000 mg | ORAL_TABLET | Freq: Every day | ORAL | Status: DC
  Administered 2016-12-07: 5 mg via ORAL
  Filled 2016-12-07: qty 1

## 2016-12-07 MED ORDER — ROSUVASTATIN CALCIUM 10 MG PO TABS
20.0000 mg | ORAL_TABLET | Freq: Every day | ORAL | Status: DC
  Administered 2016-12-08 – 2016-12-12 (×5): 20 mg via ORAL
  Filled 2016-12-07 (×5): qty 2

## 2016-12-07 NOTE — Discharge Instructions (Signed)
You must follow up in clinic tomorrow, either with the Heart Failure clinic or with cardiology.  Continue taking your medications, including furosemide every day.  Return to the ER immediately if you have new or worsened symptoms such as shortness of breath, difficulty walking, chest pain, or other concerns.

## 2016-12-07 NOTE — Consult Note (Signed)
Name: Dennis Mullen MRN: [id number] DOB: 28-Sep-1950    ADMISSION DATE:  12/07/2016  CONSULTATION DATE:  12/07/16  REFERRING MD : Dr. Harrel Lemon  CHIEF COMPLAINT:  Chest Pain  BRIEF PATIENT DESCRIPTION: 66 year old male with afib with RVR  SIGNIFICANT EVENTS  12/07/16  Patient admitted to the SDU with afib RVR, on amiodarone gtt  STUDIES:  11/27/16 ECHO>>The cavity size was normal. Wall thickness was  normal. Systolic function was normal. The estimated ejection  fraction was in the range of 60% to 65%.   HISTORY OF PRESENT ILLNESS: Dennis Mullen is a 66 year old male with known history of Anxiety, Bipolar Disorder,CAD,CHF,DM,GERD,HTN, Hypothyroidism,OSA and PVD.  He came to ED earlier today with complaints of shortness of breath and was found to be in CHF .  He was diuresed and sent home.  He returned later during evening hour on 8/23 complaining of chest pain.  Patient was noted to be in Afib with RVR.  He was intially started on cardizem however his rate was not controlled therefore was switched to amiodarone gtt.  Patient was sent to the SDU for observation.  PAST MEDICAL HISTORY :   has a past medical history of Abnormal levels of other serum enzymes; Anxiety; Bipolar disorder (HCC); BPH (benign prostatic hyperplasia); CAD (coronary artery disease); CHF (congestive heart failure) ([place]); Depression; Diabetes mellitus, type II ([place]); Diabetes mellitus, type II, insulin dependent ([place]); ED (erectile dysfunction); GERD (gastroesophageal reflux disease); Gout; History of borderline personality disorder; Hypertension; Hypogonadism in male; Hypothyroidism; Insomnia; Mood disorder ([place]); Neuropathy; Obesity; OSA (obstructive sleep apnea); PVD (peripheral vascular disease) ([place]); and Thyroid disease.  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 Prior to Admission medications   Medication Sig Start Date End Date Taking? Authorizing Provider  allopurinol (ZYLOPRIM) 100 MG tablet Take 100 mg by mouth daily.    Yes [provider]  amLODipine (NORVASC) 5 MG tablet Take 5 mg by mouth at bedtime.  10/14/14  Yes [provider]  apixaban (ELIQUIS) 5 MG TABS tablet Take 1 tablet (5 mg total) by mouth 2 (two) times daily. 06/02/16  Yes Sudini, Alveta Heimlich, MD  clopidogrel (PLAVIX) 75 MG tablet Take 75 mg by mouth daily. Reported on 07/26/2015   Yes [provider]  colchicine 0.6 MG tablet Take 0.6 mg by mouth 2 (two) times daily as needed (gout flare).  11/08/15  Yes [provider]  Continuous Blood Gluc Sensor (FREESTYLE LIBRE SENSOR SYSTEM) MISC Use 1 each as directed. Please place prior to office visit as directed 06/20/16  Yes [provider]  diclofenac (VOLTAREN) 75 MG EC tablet Take 75 mg by mouth daily as needed (gout flare).  12/09/15  Yes [provider]  divalproex (DEPAKOTE ER) 500 MG 24 hr tablet Take 1-2 tablets (500-1,000 mg total) by mouth 2 (two) times daily. Take 557m in morning and 10032min evening 08/24/16  Yes Clapacs, JoMadie RenoMD  DULoxetine (CYMBALTA) 60 MG capsule Take 1 capsule (60 mg total) by mouth daily. 08/24/16  Yes Clapacs, JoMadie RenoMD  esomeprazole (NEXIUM) 40 MG capsule Take 40 mg by mouth daily.  10/14/14  Yes [provider]  fenofibrate (TRICOR) 145 MG tablet  Take 145 mg by mouth daily.  12/23/15 12/22/16 Yes [provider]  furosemide (LASIX) 40 MG tablet Take 1 tablet (40 mg total) by mouth daily. 12/07/16  Yes Carrie Mew, MD  gemfibrozil (LOPID) 600 MG tablet gemfibrozil 600 mg tablet   Yes  [provider]  glucose 4 GM chewable tablet Chew 4 g by mouth as needed for low blood sugar.    Yes [provider]  Insulin Disposable Pump (V-GO 30) KIT Inject 1.2 Units/hr into the skin every hour. 66 units/24 hrs (36 available bolus units--12 bolus units available per meals) 10/07/14  Yes [provider]  insulin lispro (HUMALOG KWIKPEN) 100 UNIT/ML KiwkPen Inject into the skin See admin instructions. Used ONLY as needed if additional bolus units are needed (up to 36 units/24 hrs.)   Yes [provider]  isosorbide mononitrate (IMDUR) 60 MG 24 hr tablet Take 60 mg by mouth daily.  05/09/16 05/09/17 Yes [provider]  JUBLIA 10 % SOLN Apply 1 application topically daily. 10/31/16  Yes [provider]  levothyroxine (SYNTHROID, LEVOTHROID) 50 MCG tablet Take 50 mcg by mouth daily before breakfast.   Yes [provider]  metFORMIN (GLUCOPHAGE) 1000 MG tablet Take 1,000 mg by mouth 2 (two) times daily with a meal. Morning & supper   Yes [provider]  metoprolol succinate (TOPROL-XL) 50 MG 24 hr tablet Take 50 mg by mouth daily.  09/11/14  Yes [provider]  nitroGLYCERIN (NITROSTAT) 0.4 MG SL tablet Place 0.4 mg under the tongue every 5 (five) minutes x 3 doses as needed for chest pain.    Yes [provider]  pregabalin (LYRICA) 100 MG capsule Take 100 mg by mouth 3 (three) times daily before meals.    Yes [provider]  rOPINIRole (REQUIP) 2 MG tablet Take 2 mg by mouth 2 (two) times daily.  04/27/15  Yes [provider]  rosuvastatin (CRESTOR) 20 MG tablet Take 20 mg by mouth at bedtime.    Yes [provider]  VICTOZA 18 MG/3ML SOPN Inject 1.8 mg into the skin daily.  09/07/14  Yes [provider]  vitamin C (ASCORBIC ACID) 500 MG tablet Take 500 mg by mouth daily.   Yes [provider]   No Known Allergies  FAMILY HISTORY:  family history includes Anxiety  disorder in his father; Depression in his father; Heart attack in his brother, father, and sister; Lung cancer in his mother; Post-traumatic stress disorder in his father. SOCIAL HISTORY:  reports that he has never smoked. He has never used smokeless tobacco. He reports that he does not drink alcohol or use drugs.  REVIEW OF SYSTEMS:   Constitutional: Negative for fever, chills, weight loss, malaise/fatigue and diaphoresis.  HENT: Negative for hearing loss, ear pain, nosebleeds, congestion, sore throat, neck pain, tinnitus and ear discharge.   Eyes: Negative for blurred vision, double vision, photophobia, pain, discharge and redness.  Respiratory: Negative for cough, hemoptysis, sputum production, shortness of breath, wheezing and stridor.   Cardiovascular: Negative for chest pain, palpitations, orthopnea, claudication, leg swelling and PND.  Gastrointestinal: Negative for heartburn, nausea, vomiting, abdominal pain, diarrhea, constipation, blood in stool and melena.  Genitourinary: Negative for dysuria, urgency, frequency, hematuria and flank pain.  Musculoskeletal: Negative for myalgias, back pain, joint pain and falls.  Skin: Negative for itching and rash.  Neurological: Negative for dizziness, tingling, tremors, sensory change, speech change, focal weakness, seizures, loss of consciousness, weakness and headaches.  Endo/Heme/Allergies: Negative  for environmental allergies and polydipsia. Does not bruise/bleed easily.  SUBJECTIVE: Patient denies any chest pain  VITAL SIGNS: Temp:  [98 F (36.7 C)-98.1 F (36.7 C)] 98 F (36.7 C) (08/23 1818) Pulse Rate:  [54-135] 66 (08/23 2145) Resp:  [11-29] 21 (08/23 2145) BP: (80-183)/(40-102) 160/80 (08/23 2145) SpO2:  [88 %-100 %] 100 % (08/23 2145) Weight:  [129.3 kg (285 lb)] 129.3 kg (285 lb) (08/23 1824)  PHYSICAL EXAMINATION: General:  Morbidly obese male,in no acute distress Neuro:  Awake, alert and oriented HEENT: AT,[place],No  JVD Cardiovascular:  S1S2,irregular, no m/r/g Lungs: Clear bilaterally, no wheezes,crackles,rhonchi noted Abdomen: obese,round,soft, active bowel sounds Musculoskeletal:  No edema,cyanosis  Skin:  Warm,dry, intact   Recent Labs Lab 12/02/16 0446 12/07/16 0618 12/07/16 1836  NA 140 137 135  K 4.3 4.8 4.5  CL 102 105 97*  CO2 28 26 24   BUN 39* 31* 32*  CREATININE 1.56* 1.42* 1.63*  GLUCOSE 164* 361* 494*    Recent Labs Lab 12/01/16 0433 12/02/16 0446 12/07/16 0618 12/07/16 1836  HGB 10.7* 11.1* 10.5* 11.0*  HCT 32.0*  --  31.0* 34.2*  WBC 7.7  --  9.8 9.1  PLT 243  --  324 345   Dg Chest 2 View  Result Date: 12/07/2016 CLINICAL DATA:  Shortness of breath.  Chest pressure.  Dyspnea. EXAM: CHEST  2 VIEW COMPARISON:  Radiographs and CT 1 week prior 11/30/2016 FINDINGS: Unchanged heart size and mediastinal contours. Worsening pulmonary edema from prior exam. No confluent airspace disease. No pleural fluid. No pneumothorax. No acute osseous abnormality. IMPRESSION: Worsening pulmonary edema from prior exam. Electronically Signed   By: Jeb Levering M.D.   On: 12/07/2016 06:53   Dg Chest Port 1 View  Result Date: 12/07/2016 CLINICAL DATA:  Patient with sudden onset centralized chest pain. Shortness of breath. EXAM: PORTABLE CHEST 1 VIEW COMPARISON:  Chest radiograph 12/07/2016. FINDINGS: Pacer apparatus overlies the left hemithorax. Stable cardiomegaly. Bilateral perihilar interstitial pulmonary opacities. No pleural effusion or pneumothorax. IMPRESSION: Cardiomegaly with bilateral interstitial opacities favored to represent pulmonary edema, similar to prior. Electronically Signed   By: Lovey Newcomer M.D.   On: 12/07/2016 18:48    ASSESSMENT / PLAN:  Atrial fibrillation with RVR Acute on Chronic kidney disease DM Elevated troponin Hypothyroidism Hypertension/Hyperlipidemia Hx of CAD Hx Of GERD   Continue Amiodarone gtt Trend troponin Continue Synthroid Blood glucose  checks with SSI coverage Continue Heparin gtt Continue Amlodipine/diltiazem/Imdur Continue Rosuvastatin Continue Protonix Continue plavix Rest per primary     Bincy Varughese,AG-ACNP Pulmonary and [place]   12/07/2016, 10:58 PM

## 2016-12-07 NOTE — ED Triage Notes (Signed)
Per EMS, pt reports waking up with [facility]. Pt states hx of CHF and was admitted last week for similar symptoms. Pt does not wear oxygen at home and when EMS arrived, pt's oxygen was 88% on RA. Pt placed on 3L and oxygen came up to 96%. Pt A&O at this time and able to answer questions.

## 2016-12-07 NOTE — Progress Notes (Signed)
[place] responded to a PG for a Code Stemi. Pt was experiencing pain as he arrived to the unit. [place] attempted several times to notify Pt son's. Pt later stated that they are aware he is at [facility]. Code Stemi was later canceled.     12/07/16 1900  Clinical Encounter Type  Visited With Patient;Health care provider  Visit Type Initial;Spiritual support;Code;ED (Code Stemi)  Referral From Nurse  Consult/Referral To Chaplain  Spiritual Encounters  Spiritual Needs Emotional

## 2016-12-07 NOTE — Progress Notes (Signed)
Inpatient Diabetes Program Recommendations  AACE/ADA: New Consensus Statement on Inpatient Glycemic Control (2015)  Target Ranges:  Prepandial:   less than 140 mg/dL      Peak postprandial:   less than 180 mg/dL (1-2 hours)      Critically ill patients:  140 - 180 mg/dL   Lab Results  Component Value Date   GLUCAP 204 (H) 12/02/2016   HGBA1C 7.3 (H) 11/30/2016    Diabetes history:Type 2  Outpatient Diabetes medications: V-go 30 with Humolog insulin (30 units of basal and 12 units Humalog at meals), Metformin 1000mg  bid, Victoza 1.8mg   Recommendations: consider  Levemir 25 units qday, Novolog 0-9 units tid, Novolog 0-5 units qhs, Novolog 10 units tid if he is eating (hold if he eats less than 50%)  Gentry Fitz, RN, BA, [place], CDE Diabetes Coordinator Inpatient Diabetes Program  [phone] (Team Pager) [phone] ([place]) 12/07/2016 8:37 AM

## 2016-12-07 NOTE — ED Notes (Signed)
Pt given [place] ice (approved by Dr Joni Fears)

## 2016-12-07 NOTE — H&P (Signed)
Dennis Mullen is an 66 y.o. male.   Chief Complaint: chest pain HPI: this is a 66 year old male has a history of CHF. He came to the ED earlier with complaints of shortness of breath is found in congestive heart failure. He was diuresed and sent home. He returned several hours later with chest pain and was found to be in rapid A. Fib. He was placed on a Cardizem drip was still didn't control his rate and cardiology saw him and since placed him on amiodarone drip. Rate is in the low 100s now. He's no longer having chest pain.  Past Medical History:  Diagnosis Date  . Abnormal levels of other serum enzymes   . Anxiety   . Bipolar disorder ([place])   . BPH (benign prostatic hyperplasia)   . CAD (coronary artery disease)   . CHF (congestive heart failure) ([place])    ?  Marland Kitchen Depression   . Diabetes mellitus, type II ([place])   . Diabetes mellitus, type II, insulin dependent ([place])   . ED (erectile dysfunction)   . GERD (gastroesophageal reflux disease)   . Gout   . History of borderline personality disorder   . Hypertension   . Hypogonadism in male   . Hypothyroidism   . Insomnia   . Mood disorder ([place])   . Neuropathy   . Obesity   . OSA (obstructive sleep apnea)   . PVD (peripheral vascular disease) ([place])   . Thyroid disease     Past Surgical History:  Procedure Laterality Date  . AMPUTATION TOE Left 08/25/2016   Procedure: AMPUTATION TOE/ipj left 2nd toe;  Surgeon: Sharlotte Alamo, DPM;  Location: [facility];  Service: Podiatry;  Laterality: Left;  . CARDIAC CATHETERIZATION N/A 10/12/2015   Procedure: Left Heart Cath and Coronary Angiography;  Surgeon: Teodoro Spray, MD;  Location: [place] [facility];  Service: Cardiovascular;  Laterality: N/A;  . CORONARY ANGIOPLASTY WITH STENT PLACEMENT     x 6 stents  . FOOT SURGERY Right    fracture repair with screw  . LEFT HEART CATH AND CORONARY ANGIOGRAPHY Right 06/01/2016   Procedure: Left Heart Cath and Coronary Angiography;  Surgeon: Isaias Cowman, MD;  Location: [place] [facility];  Service: Cardiovascular;  Laterality: Right;  . LEFT HEART CATH AND CORONARY ANGIOGRAPHY N/A 11/28/2016   Procedure: LEFT HEART CATH AND CORONARY ANGIOGRAPHY and possible pci;  Surgeon: Yolonda Kida, MD;  Location: [place] [facility];  Service: Cardiovascular;  Laterality: N/A;  . MASTOIDECTOMY Right   . NASAL SEPTUM SURGERY    . penial inplant    . PENILE PROSTHESIS IMPLANT    . right mastoidectomy    . SCROTOPLASTY    . TONSILLECTOMY AND ADENOIDECTOMY      Family History  Problem Relation Age of Onset  . Lung cancer Mother   . Heart attack Father   . Post-traumatic stress disorder Father   . Anxiety disorder Father   . Depression Father   . Heart attack Sister   . Heart attack Brother   . Prostate cancer Neg Hx   . Kidney cancer Neg Hx    Social History:  reports that he has never smoked. He has never used smokeless tobacco. He reports that he does not drink alcohol or use drugs.  Allergies: No Known Allergies   (Not in a hospital admission)  Results for orders placed or performed during the hospital encounter of 12/07/16 (from the past 48 hour(s))  CBC with Differential/Platelet  Status: Abnormal   Collection Time: 12/07/16  6:36 PM  Result Value Ref Range   WBC 9.1 3.8 - 10.6 K/uL   RBC 3.94 (L) 4.40 - 5.90 MIL/uL   Hemoglobin 11.0 (L) 13.0 - 18.0 g/dL   HCT 34.2 (L) 40.0 - 52.0 %   MCV 86.8 80.0 - 100.0 fL   MCH 27.9 26.0 - 34.0 pg   MCHC 32.1 32.0 - 36.0 g/dL   RDW 18.3 (H) 11.5 - 14.5 %   Platelets 345 150 - 440 K/uL   Neutrophils Relative % 67 %   Neutro Abs 6.2 1.4 - 6.5 K/uL   Lymphocytes Relative 20 %   Lymphs Abs 1.8 1.0 - 3.6 K/uL   Monocytes Relative 10 %   Monocytes Absolute 0.9 0.2 - 1.0 K/uL   Eosinophils Relative 2 %   Eosinophils Absolute 0.1 0 - 0.7 K/uL   Basophils Relative 1 %   Basophils Absolute 0.1 0 - 0.1 K/uL  Comprehensive metabolic panel     Status: Abnormal   Collection  Time: 12/07/16  6:36 PM  Result Value Ref Range   Sodium 135 135 - 145 mmol/L   Potassium 4.5 3.5 - 5.1 mmol/L   Chloride 97 (L) 101 - 111 mmol/L   CO2 24 22 - 32 mmol/L   Glucose, Bld 494 (H) 65 - 99 mg/dL   BUN 32 (H) 6 - 20 mg/dL   Creatinine, Ser 1.63 (H) 0.61 - 1.24 mg/dL   Calcium 9.6 8.9 - 10.3 mg/dL   Total Protein 8.0 6.5 - 8.1 g/dL   Albumin 3.6 3.5 - 5.0 g/dL   AST 31 15 - 41 U/L   ALT 17 17 - 63 U/L   Alkaline Phosphatase 69 38 - 126 U/L   Total Bilirubin 0.8 0.3 - 1.2 mg/dL   GFR calc non Af Amer 43 (L) >60 mL/min   GFR calc Af Amer 50 (L) >60 mL/min    Comment: (NOTE) The eGFR has been calculated using the CKD EPI equation. This calculation has not been validated in all clinical situations. eGFR's persistently <60 mL/min signify possible Chronic Kidney Disease.    Anion gap 14 5 - 15  Troponin I     Status: Abnormal   Collection Time: 12/07/16  6:36 PM  Result Value Ref Range   Troponin I 1.20 (HH) <0.03 ng/mL    Comment: CRITICAL VALUE NOTED. VALUE IS CONSISTENT WITH PREVIOUSLY REPORTED/CALLED VALUE. [place].  Lipid panel     Status: Abnormal   Collection Time: 12/07/16  6:36 PM  Result Value Ref Range   Cholesterol 183 0 - 200 mg/dL   Triglycerides 377 (H) <150 mg/dL   HDL 34 (L) >40 mg/dL   Total CHOL/HDL Ratio 5.4 RATIO   VLDL 75 (H) 0 - 40 mg/dL   LDL Cholesterol 74 0 - 99 mg/dL    Comment:        Total Cholesterol/HDL:CHD Risk Coronary Heart Disease Risk Table                     Men   Women  1/2 Average Risk   3.4   3.3  Average Risk       5.0   4.4  2 X Average Risk   9.6   7.1  3 X Average Risk  23.4   11.0        Use the calculated Patient Ratio above and the CHD Risk Table to determine the patient's CHD  Risk.        ATP III CLASSIFICATION (LDL):  <100     mg/dL   Optimal  100-129  mg/dL   Near or Above                    Optimal  130-159  mg/dL   Borderline  160-189  mg/dL   High  >190     mg/dL   Very High   Protime-INR     Status:  Abnormal   Collection Time: 12/07/16  7:12 PM  Result Value Ref Range   Prothrombin Time 15.3 (H) 11.4 - 15.2 seconds   INR 1.20   APTT     Status: Abnormal   Collection Time: 12/07/16  7:12 PM  Result Value Ref Range   aPTT 69 (H) 24 - 36 seconds    Comment:        IF BASELINE aPTT IS ELEVATED, SUGGEST PATIENT RISK ASSESSMENT BE USED TO DETERMINE APPROPRIATE ANTICOAGULANT THERAPY.   Heparin level (unfractionated)     Status: Abnormal   Collection Time: 12/07/16  7:12 PM  Result Value Ref Range   Heparin Unfractionated 1.04 (H) 0.30 - 0.70 IU/mL    Comment:        IF HEPARIN RESULTS ARE BELOW EXPECTED VALUES, AND PATIENT DOSAGE HAS BEEN CONFIRMED, SUGGEST FOLLOW UP TESTING OF ANTITHROMBIN III LEVELS.    Dg Chest 2 View  Result Date: 12/07/2016 CLINICAL DATA:  Shortness of breath.  Chest pressure.  Dyspnea. EXAM: CHEST  2 VIEW COMPARISON:  Radiographs and CT 1 week prior 11/30/2016 FINDINGS: Unchanged heart size and mediastinal contours. Worsening pulmonary edema from prior exam. No confluent airspace disease. No pleural fluid. No pneumothorax. No acute osseous abnormality. IMPRESSION: Worsening pulmonary edema from prior exam. Electronically Signed   By: Jeb Levering M.D.   On: 12/07/2016 06:53   Dg Chest Port 1 View  Result Date: 12/07/2016 CLINICAL DATA:  Patient with sudden onset centralized chest pain. Shortness of breath. EXAM: PORTABLE CHEST 1 VIEW COMPARISON:  Chest radiograph 12/07/2016. FINDINGS: Pacer apparatus overlies the left hemithorax. Stable cardiomegaly. Bilateral perihilar interstitial pulmonary opacities. No pleural effusion or pneumothorax. IMPRESSION: Cardiomegaly with bilateral interstitial opacities favored to represent pulmonary edema, similar to prior. Electronically Signed   By: Lovey Newcomer M.D.   On: 12/07/2016 18:48    Review of Systems  Constitutional: Negative for fever.  HENT: Negative for hearing loss.   Eyes: Negative for blurred vision.   Respiratory: Negative for cough.   Cardiovascular: Negative for chest pain.  Gastrointestinal: Negative for nausea.  Genitourinary: Negative for dysuria.  Musculoskeletal: Negative for myalgias.  Skin: Negative for rash.  Neurological: Negative for dizziness.    Blood pressure (!) 122/57, pulse 77, temperature 98 F (36.7 C), temperature source Oral, resp. rate (!) 24, height 6' 2"  (1.88 m), weight 129.3 kg (285 lb), SpO2 99 %. Physical Exam  Constitutional: He is oriented to person, place, and time. He appears well-developed and well-nourished. No distress.  HENT:  Head: Normocephalic and atraumatic.  Mouth/Throat: Oropharynx is clear and moist.  Eyes: Pupils are equal, round, and reactive to light. No scleral icterus.  Neck: Neck supple. No JVD present. No tracheal deviation present. No thyromegaly present.  Cardiovascular:  No murmur heard. Irregularly irregular no murmurs  Respiratory: Breath sounds normal. No respiratory distress.  GI: Soft. Bowel sounds are normal. He exhibits no mass.  Musculoskeletal: He exhibits no edema.  Lymphadenopathy:    He has no  cervical adenopathy.  Neurological: He is alert and oriented to person, place, and time. No cranial nerve deficit.  Skin: Skin is warm and dry.     Assessment/Plan 1. Atrial fibrillation with rapid ventricular response. Patient is going in and out of rapid A. Fib and a flutter. He's currently on Cardizem and amiodarone drip. He's also been heparinized for anticoagulation. We'll continue the drip until his heart rate is controlled and try to convert him to oral medications. Cardiology has been consulted. 2. Elevated troponin. He has been seen by cardiology. We'll continue current treatment. Cycle his enzymes. He had a catheter last week which did not show any acute occlusions. 3. Diabetes. He has elevated blood sugar he was put on insulin drip here in the ER. I will discontinue that and put him on sliding scale. 4.  Hypothyroidism. Continue Synthroid  Total critical care time spent was 40 minutes Baxter Hire, MD 12/07/2016, 8:55 PM

## 2016-12-07 NOTE — ED Notes (Signed)
Cardiologist to bedside at this time.

## 2016-12-07 NOTE — ED Provider Notes (Signed)
[facility] Emergency Department Provider Note    None    (approximate)  I have reviewed the triage vital signs and the nursing notes.   HISTORY  Chief Complaint Code STEMI    HPI Dennis Mullen is a 66 y.o. male presents with chief complaint of sudden onset chest pain roughly an hour and a half ago. Patient was seen in the ER for shortness  Earlier today. Patient called EMS due to severe chest pain or shortness of breath. Twelve-lead showed significant ST depressions and elevations in aVR and V1. Code STEMI called in transit. Patient had evidence of atrial fibrillation with RVR. Currently rated pain as 10 out of 10 in severity. No diaphoresis.   Past Medical History:  Diagnosis Date  . Abnormal levels of other serum enzymes   . Anxiety   . Bipolar disorder ([place])   . BPH (benign prostatic hyperplasia)   . CAD (coronary artery disease)   . CHF (congestive heart failure) ([place])    ?  Marland Kitchen Depression   . Diabetes mellitus, type II ([place])   . Diabetes mellitus, type II, insulin dependent ([place])   . ED (erectile dysfunction)   . GERD (gastroesophageal reflux disease)   . Gout   . History of borderline personality disorder   . Hypertension   . Hypogonadism in male   . Hypothyroidism   . Insomnia   . Mood disorder ([place])   . Neuropathy   . Obesity   . OSA (obstructive sleep apnea)   . PVD (peripheral vascular disease) ([place])   . Thyroid disease    Family History  Problem Relation Age of Onset  . Lung cancer Mother   . Heart attack Father   . Post-traumatic stress disorder Father   . Anxiety disorder Father   . Depression Father   . Heart attack Sister   . Heart attack Brother   . Prostate cancer Neg Hx   . Kidney cancer Neg Hx    Past Surgical History:  Procedure Laterality Date  . AMPUTATION TOE Left 08/25/2016   Procedure: AMPUTATION TOE/ipj left 2nd toe;  Surgeon: Sharlotte Alamo, DPM;  Location: [facility] ORS;  Service: Podiatry;  Laterality:  Left;  . CARDIAC CATHETERIZATION N/A 10/12/2015   Procedure: Left Heart Cath and Coronary Angiography;  Surgeon: Teodoro Spray, MD;  Location: [place] [facility];  Service: Cardiovascular;  Laterality: N/A;  . CORONARY ANGIOPLASTY WITH STENT PLACEMENT     x 6 stents  . FOOT SURGERY Right    fracture repair with screw  . LEFT HEART CATH AND CORONARY ANGIOGRAPHY Right 06/01/2016   Procedure: Left Heart Cath and Coronary Angiography;  Surgeon: Isaias Cowman, MD;  Location: [place] [facility];  Service: Cardiovascular;  Laterality: Right;  . LEFT HEART CATH AND CORONARY ANGIOGRAPHY N/A 11/28/2016   Procedure: LEFT HEART CATH AND CORONARY ANGIOGRAPHY and possible pci;  Surgeon: Yolonda Kida, MD;  Location: [place] [facility];  Service: Cardiovascular;  Laterality: N/A;  . MASTOIDECTOMY Right   . NASAL SEPTUM SURGERY    . penial inplant    . PENILE PROSTHESIS IMPLANT    . right mastoidectomy    . SCROTOPLASTY    . TONSILLECTOMY AND ADENOIDECTOMY     Patient Active Problem List   Diagnosis Date Noted  . Acute respiratory failure with hypoxia ([place]) 12/02/2016  . Acute on chronic diastolic CHF (congestive heart failure) ([place]) 12/02/2016  . Demand ischemia ([place]) 12/02/2016  . Moderate aortic  stenosis 12/02/2016  . Hyperkalemia 12/02/2016  . Non-STEMI (non-ST elevated myocardial infarction) ([place]) 11/30/2016  . Acute diastolic CHF (congestive heart failure) ([place]) 11/27/2016  . Acute CHF ([place]) 11/27/2016  . Lymphedema 03/30/2016  . Left leg cellulitis 03/12/2016  . Insulin overdose 02/02/2016  . Hypoglycemia   . OSA on CPAP   . Depression   . Hypophosphatemia   . SOB (shortness of breath) 10/11/2015  . Elevated troponin 10/11/2015  . Chest pain at rest 09/24/2015  . Elevated PSA 07/26/2015  . Hypogonadism in male 07/26/2015  . 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 BPH with obstruction/lower urinary tract symptoms 06/29/2015  . Bipolar 1 disorder, mixed  ([place]) 04/14/2015  . Borderline personality disorder 04/14/2015  . Acute renal failure ([place]) 11/15/2014  . Chest pain 11/14/2014  . Dizziness 11/05/2014  . Benign essential HTN 11/04/2014  . Combined fat and carbohydrate induced hyperlipemia 11/04/2014  . Bipolar 2 disorder ([place]) 10/27/2014  . Acquired hypothyroidism 02/03/2014  . Type 2 diabetes mellitus ([place]) 02/03/2014  . Adiposity 01/06/2014  . Obesity, diabetes, and hypertension syndrome ([place]) 12/23/2013  . Long term current use of insulin ([place]) 12/23/2013  . Type 2 diabetes mellitus with other diabetic neurological complication ([place]) 04/54/0981  . Disorder affecting the body's metabolism 12/23/2013  . Acute inflammation of the pancreas 11/18/2013  . Elevated cholesterol with elevated triglycerides 11/18/2013  . CAD (coronary artery disease), native coronary artery 09/26/2013  . Diabetes mellitus ([place]) 09/26/2013  . Acute non-ST elevation myocardial infarction (NSTEMI) ([place]) 09/26/2013  . Non-ST elevation (NSTEMI) myocardial infarction ([place]) 09/26/2013      Prior to Admission medications   Medication Sig Start Date End Date Taking? Authorizing Provider  allopurinol (ZYLOPRIM) 100 MG tablet Take 100 mg by mouth daily.    Yes [provider]  amLODipine (NORVASC) 5 MG tablet Take 5 mg by mouth at bedtime.  10/14/14  Yes [provider]  apixaban (ELIQUIS) 5 MG TABS tablet Take 1 tablet (5 mg total) by mouth 2 (two) times daily. 06/02/16  Yes Sudini, Alveta Heimlich, MD  clopidogrel (PLAVIX) 75 MG tablet Take 75 mg by mouth daily. Reported on 07/26/2015   Yes [provider]  colchicine 0.6 MG tablet Take 0.6 mg by mouth 2 (two) times daily as needed (gout flare).  11/08/15  Yes [provider]  Continuous Blood Gluc Sensor (FREESTYLE LIBRE SENSOR SYSTEM) MISC Use 1 each as directed. Please place prior to office visit as directed 06/20/16  Yes [provider]  diclofenac (VOLTAREN) 75 MG EC tablet Take  75 mg by mouth daily as needed (gout flare).  12/09/15  Yes [provider]  divalproex (DEPAKOTE ER) 500 MG 24 hr tablet Take 1-2 tablets (500-1,000 mg total) by mouth 2 (two) times daily. Take 545m in morning and 10014min evening 08/24/16  Yes Clapacs, JoMadie RenoMD  DULoxetine (CYMBALTA) 60 MG capsule Take 1 capsule (60 mg total) by mouth daily. 08/24/16  Yes Clapacs, JoMadie RenoMD  esomeprazole (NEXIUM) 40 MG capsule Take 40 mg by mouth daily.  10/14/14  Yes [provider]  fenofibrate (TRICOR) 145 MG tablet Take 145 mg by mouth daily.  12/23/15 12/22/16 Yes [provider]  furosemide (LASIX) 40 MG tablet Take 1 tablet (40 mg total) by mouth daily. 12/07/16  Yes StCarrie MewMD  gemfibrozil (LOPID) 600 MG tablet gemfibrozil 600 mg tablet   Yes [provider]  glucose 4 GM chewable tablet Chew 4 g by mouth  as needed for low blood sugar.    Yes [provider]  Insulin Disposable Pump (V-GO 30) KIT Inject 1.2 Units/hr into the skin every hour. 66 units/24 hrs (36 available bolus units--12 bolus units available per meals) 10/07/14  Yes [provider]  insulin lispro (HUMALOG KWIKPEN) 100 UNIT/ML KiwkPen Inject into the skin See admin instructions. Used ONLY as needed if additional bolus units are needed (up to 36 units/24 hrs.)   Yes [provider]  isosorbide mononitrate (IMDUR) 60 MG 24 hr tablet Take 60 mg by mouth daily.  05/09/16 05/09/17 Yes [provider]  JUBLIA 10 % SOLN Apply 1 application topically daily. 10/31/16  Yes [provider]  levothyroxine (SYNTHROID, LEVOTHROID) 50 MCG tablet Take 50 mcg by mouth daily before breakfast.   Yes [provider]  metFORMIN (GLUCOPHAGE) 1000 MG tablet Take 1,000 mg by mouth 2 (two) times daily with a meal. Morning & supper   Yes [provider]  metoprolol succinate (TOPROL-XL) 50 MG 24 hr tablet Take 50 mg by mouth daily.  09/11/14  Yes [provider]  nitroGLYCERIN (NITROSTAT) 0.4 MG SL tablet Place 0.4 mg under the tongue every 5 (five) minutes x 3 doses as needed for chest pain.    Yes [provider]  pregabalin (LYRICA) 100 MG capsule Take 100 mg by mouth 3 (three) times daily before meals.    Yes [provider]  rOPINIRole (REQUIP) 2 MG tablet Take 2 mg by mouth 2 (two) times daily.  04/27/15  Yes [provider]  rosuvastatin (CRESTOR) 20 MG tablet Take 20 mg by mouth at bedtime.    Yes [provider]  VICTOZA 18 MG/3ML SOPN Inject 1.8 mg into the skin daily.  09/07/14  Yes [provider]  vitamin C (ASCORBIC ACID) 500 MG tablet Take 500 mg by mouth daily.   Yes [provider]    Allergies Patient has no known allergies.    Social History Social History  Substance Use Topics  . Smoking status: Never Smoker  . Smokeless tobacco: Never Used  . Alcohol use No    Review of Systems Patient denies headaches, rhinorrhea, blurry vision, numbness, shortness of breath, chest pain, edema, cough, abdominal pain, nausea, vomiting, diarrhea, dysuria, fevers, rashes or hallucinations unless otherwise stated above in HPI. ____________________________________________   PHYSICAL EXAM:  VITAL SIGNS: Vitals:   12/07/16 1915 12/07/16 1930  BP: (!) 173/94 (!) 161/80  Pulse: (!) 130 65  Resp: (!) 25 (!) 29  Temp:    SpO2: 94% 91%    Constitutional: Alert and oriented.ill appearing very uncomfortable in acute distress. Eyes: Conjunctivae are normal.  Head: Atraumatic. Nose: No congestion/rhinnorhea. Mouth/Throat: Mucous membranes are moist.   Neck: No stridor. Painless ROM.  Cardiovascular: tachycardic irregularly irregular rhythm. Grossly normal heart sounds.  Good peripheral circulation. Respiratory: Normal respiratory effort.  No retractions. Lungs CTAB. Gastrointestinal: Soft and nontender. No distention. No abdominal bruits. No CVA tenderness. Musculoskeletal: No lower  extremity tenderness nor edema.  No joint effusions. Neurologic:  Normal speech and language. No gross focal neurologic deficits are appreciated. No facial droop Skin:  Skin is warm, dry and intact. No rash noted. Psychiatric: Mood and affect are normal. Speech and behavior are normal.  ____________________________________________   LABS (all labs ordered are listed, but only abnormal results are displayed)  Results for orders placed or performed during the hospital encounter of 12/07/16 (from the past 24 hour(s))  CBC with Differential/Platelet  Status: Abnormal   Collection Time: 12/07/16  6:36 PM  Result Value Ref Range   WBC 9.1 3.8 - 10.6 K/uL   RBC 3.94 (L) 4.40 - 5.90 MIL/uL   Hemoglobin 11.0 (L) 13.0 - 18.0 g/dL   HCT 34.2 (L) 40.0 - 52.0 %   MCV 86.8 80.0 - 100.0 fL   MCH 27.9 26.0 - 34.0 pg   MCHC 32.1 32.0 - 36.0 g/dL   RDW 18.3 (H) 11.5 - 14.5 %   Platelets 345 150 - 440 K/uL   Neutrophils Relative % 67 %   Neutro Abs 6.2 1.4 - 6.5 K/uL   Lymphocytes Relative 20 %   Lymphs Abs 1.8 1.0 - 3.6 K/uL   Monocytes Relative 10 %   Monocytes Absolute 0.9 0.2 - 1.0 K/uL   Eosinophils Relative 2 %   Eosinophils Absolute 0.1 0 - 0.7 K/uL   Basophils Relative 1 %   Basophils Absolute 0.1 0 - 0.1 K/uL  Comprehensive metabolic panel     Status: Abnormal   Collection Time: 12/07/16  6:36 PM  Result Value Ref Range   Sodium 135 135 - 145 mmol/L   Potassium 4.5 3.5 - 5.1 mmol/L   Chloride 97 (L) 101 - 111 mmol/L   CO2 24 22 - 32 mmol/L   Glucose, Bld 494 (H) 65 - 99 mg/dL   BUN 32 (H) 6 - 20 mg/dL   Creatinine, Ser 1.63 (H) 0.61 - 1.24 mg/dL   Calcium 9.6 8.9 - 10.3 mg/dL   Total Protein 8.0 6.5 - 8.1 g/dL   Albumin 3.6 3.5 - 5.0 g/dL   AST 31 15 - 41 U/L   ALT 17 17 - 63 U/L   Alkaline Phosphatase 69 38 - 126 U/L   Total Bilirubin 0.8 0.3 - 1.2 mg/dL   GFR calc non Af Amer 43 (L) >60 mL/min   GFR calc Af Amer 50 (L) >60 mL/min   Anion gap 14 5 - 15  Troponin I      Status: Abnormal   Collection Time: 12/07/16  6:36 PM  Result Value Ref Range   Troponin I 1.20 (HH) <0.03 ng/mL  Lipid panel     Status: Abnormal   Collection Time: 12/07/16  6:36 PM  Result Value Ref Range   Cholesterol 183 0 - 200 mg/dL   Triglycerides 377 (H) <150 mg/dL   HDL 34 (L) >40 mg/dL   Total CHOL/HDL Ratio 5.4 RATIO   VLDL 75 (H) 0 - 40 mg/dL   LDL Cholesterol 74 0 - 99 mg/dL   ____________________________________________  EKG My review and personal interpretation at Time: 17:47   Indication: chest   Rate: 115  Rhythm: afib w/ rvr Axis: lnormal  Other: inferolateral st depressions with elevations in Avr and and V1  My review and personal interpretation at Time: 18:31  Indication: chest   Rate: 136 Rhythm:a flutter w/ rvr Axis: lnormal  Other: inferolateral st depressions with elevations in Avr and and V1 ____________________________________________  RADIOLOGY  I personally reviewed all radiographic images ordered to evaluate for the above acute complaints and reviewed radiology reports and findings.  These findings were personally discussed with the patient.  Please see medical record for radiology report.  ____________________________________________   PROCEDURES  Procedure(s) performed:  Procedures    Critical Care performed: yes CRITICAL CARE Performed by: Merlyn Lot   Total critical care time: 45 minutes  Critical care time was exclusive of separately billable procedures and treating other patients.  Critical care was necessary to treat or prevent imminent or life-threatening deterioration.  Critical care was time spent personally by me on the following activities: development of treatment plan with patient and/or surrogate as well as nursing, discussions with consultants, evaluation of patient's response to treatment, examination of patient, obtaining history from patient or surrogate, ordering and performing treatments and interventions,  ordering and review of laboratory studies, ordering and review of radiographic studies, pulse oximetry and re-evaluation of patient's condition.  ____________________________________________   INITIAL IMPRESSION / ASSESSMENT AND PLAN / ED COURSE  Pertinent labs & imaging results that were available during my care of the patient were reviewed by me and considered in my medical decision making (see chart for details).  DDX: acs, stemi, dissection, heart failure, pna  SAMAR DASS is a 66 y.o. who presents to the ED with chest pain shortness of breath as described above. Patient is critically ill-appearing On initial assessment and review the G he does have significant ST depressions and I am concern for myocardial ischemia however his presentation is more complex given recent normal catheterization and does not really meet any ST elevation MI criteria.In the setting of his rapid A. Fib these changes could also be secondary to rate dependent ischemia. I  Promptly give the patient IV Cardizem. Patient was placed on supplement oxygen.  Cardiology consulted.  The patient will be placed on continuous pulse oximetry and telemetry for monitoring.  Laboratory evaluation will be sent to evaluate for the above complaints.  Will start patient on amiodarone bolus and gtt, will start heparin.   Clinical Course as of Dec 08 1934  Thu Dec 07, 2016  1849 Patient now feels that his symp have improved Now appears to be with atrial flutter in setting of A. Fib. She is to be having some improvement with amiodarone.  Patient has been evaluated at bedside by Dr. Clayborn Bigness who performed his cardiac catheter on the 14th of this month. At this point does not meet criteria for STEMI. Patient still critically ill but do suspect a lot of his symptoms aredary to rate dependent ischemia rather than true thromboembolic disorder at this time as the patient is medically optimize otherwise. We'll continue to closely monitor.  [PR]    1916 PAtient's heart rate still with RVR but patient still appears more comfortable. We'll increase her Cardizem drip. Still receiving amiodarone infusion. Amiodarone bolus has been completed.  [PR]  1930 PAtient does have evidence of hyperglycemia. Troponin is improved from the most recent troponin. Seems to be responding better to Cardizem drip at 15.  PAin seems to have improved. I do believe that he is stabilized enough to be admitted to the ICU.  Have discussed with the patient and available family all diagnostics and treatments performed thus far and all questions were answered to the best of my ability. The patient demonstrates understanding and agreement with plan.   [PR]    Clinical Course User Index [PR] Merlyn Lot, MD     ____________________________________________   FINAL CLINICAL IMPRESSION(S) / ED DIAGNOSES  Final diagnoses:  Atrial fibrillation with rapid ventricular response (HCC)  Atrial flutter with rapid ventricular response (HCC)  Chest pain, unspecified type  Congestive heart failure, unspecified HF chronicity, unspecified heart failure type ([place])  Acute respiratory failure with hypoxia ([place])      NEW MEDICATIONS STARTED DURING THIS VISIT:  New Prescriptions   No medications on file     Note:  This document was prepared using Dragon voice  recognition software and may include unintentional dictation errors.    Merlyn Lot, MD 12/07/16 [phone]

## 2016-12-07 NOTE — ED Triage Notes (Signed)
Pt in via [facility] from with complaints of sudden onset centralized chest pain with associated shortness of breath.  Pt took 4 nitro tablets at home without any relief.  Pt given 324mg  ASA and 66mcg Fentanyl per EMS.  Pt placed on zoll monitor and ED MD to bedside upon arrival.

## 2016-12-07 NOTE — ED Notes (Signed)
Apolonio Schneiders RN, notified of assigned bed

## 2016-12-07 NOTE — Progress Notes (Signed)
ANTICOAGULATION CONSULT NOTE - Initial Consult  Pharmacy Consult for Heparin Indication: chest pain/ACS  No Known Allergies  Patient Measurements: Height: 6\' 2"  (188 cm) Weight: 285 lb (129.3 kg) IBW/kg (Calculated) : 82.2 Heparin Dosing Weight: 110  Vital Signs: Temp: 98 F (36.7 C) (08/23 1818) Temp Source: Oral (08/23 1818) BP: 176/88 (08/23 1900) Pulse Rate: 131 (08/23 1900)  Labs:  Recent Labs  12/07/16 0618 12/07/16 1145 12/07/16 1836  HGB 10.5*  --  11.0*  HCT 31.0*  --  34.2*  PLT 324  --  345  CREATININE 1.42*  --  1.63*  TROPONINI 1.77* 2.69* 1.20*    Estimated Creatinine Clearance: 65.4 mL/min (A) (by C-G formula based on SCr of 1.63 mg/dL (H)).   Medical History: Past Medical History:  Diagnosis Date  . Abnormal levels of other serum enzymes   . Anxiety   . Bipolar disorder ([place])   . BPH (benign prostatic hyperplasia)   . CAD (coronary artery disease)   . CHF (congestive heart failure) ([place])    ?  Marland Kitchen Depression   . Diabetes mellitus, type II ([place])   . Diabetes mellitus, type II, insulin dependent ([place])   . ED (erectile dysfunction)   . GERD (gastroesophageal reflux disease)   . Gout   . History of borderline personality disorder   . Hypertension   . Hypogonadism in male   . Hypothyroidism   . Insomnia   . Mood disorder ([place])   . Neuropathy   . Obesity   . OSA (obstructive sleep apnea)   . PVD (peripheral vascular disease) ([place])   . Thyroid disease     Medications:   (Not in a hospital admission) Scheduled:  . heparin  4,000 Units Intravenous Once   Infusions:  . sodium chloride 1,000 mL (12/07/16 1829)  . amiodarone 60 mg/hr (12/07/16 1900)  . [START ON 12/08/2016] amiodarone    . amiodarone    . dextrose 5 % and 0.45% NaCl    . diltiazem (CARDIZEM) infusion 15 mg/hr (12/07/16 1920)  . heparin    . insulin (NOVOLIN-R) infusion      Assessment: Pharmacy consulted to dose and monitor heparin in this 66 year old male being  treated for ACS. Patient takes apixaban as an outpatient. Last dose of apixaban was 1800 on 8/22.  Goal of Therapy:  Heparin level 0.3-0.7 units/ml Monitor platelets by anticoagulation protocol: Yes   Plan:  Since patient's last dose of apixaban was >24 hours, apixaban likely no longer hanging around therefore will bolus patient with heparin. Will order heparin bolus of 4000 units x 1 and will start heparin gtt @ 1450 units/hr.Will check Heparin level @ 0200.   Mahari Vankirk D 12/07/2016,7:28 PM

## 2016-12-07 NOTE — ED Notes (Signed)
Patient transported to X-ray 

## 2016-12-07 NOTE — ED Provider Notes (Addendum)
[facility] Emergency Department Provider Note  ____________________________________________  Time seen: Approximately 7:15 AM  I have reviewed the triage vital signs and the nursing notes.   HISTORY  Chief Complaint Shortness of Breath    HPI GEAROLD WAINER is a 66 y.o. male who complains of worsening shortness of breath since last night. He was recently in the hospital due to CHF exacerbation a week ago, diuresed 4 L, found to be ambulatory with stable oxygen saturations on room air at time of discharge. Patient subsequently missed a follow-up appointment with the heart failure clinic. He reports that he has been compliant with all his medications, but his breathing is worsened. He normally would lie flat at night to sleep with one pillow, but last night he slept in a recliner because he is more comfortable being upright. He has a nonproductive cough. Denies peripheral edema. Denies chest pain. Symptoms of been constant, worse with walking, no alleviating factors. Rates it as moderate to severe.  Patient reports that just this morning his insurance company brought him a scale and a I pad for logging his medical information on a daily basis so that they can help anticipating his decompensations.   Past Medical History:  Diagnosis Date  . Abnormal levels of other serum enzymes   . Anxiety   . Bipolar disorder ([place])   . BPH (benign prostatic hyperplasia)   . CAD (coronary artery disease)   . CHF (congestive heart failure) ([place])    ?  Marland Kitchen Depression   . Diabetes mellitus, type II ([place])   . Diabetes mellitus, type II, insulin dependent ([place])   . ED (erectile dysfunction)   . GERD (gastroesophageal reflux disease)   . Gout   . History of borderline personality disorder   . Hypertension   . Hypogonadism in male   . Hypothyroidism   . Insomnia   . Mood disorder ([place])   . Neuropathy   . Obesity   . OSA (obstructive sleep apnea)   . PVD (peripheral  vascular disease) ([place])   . Thyroid disease      Patient Active Problem List   Diagnosis Date Noted  . Acute respiratory failure with hypoxia ([place]) 12/02/2016  . Acute on chronic diastolic CHF (congestive heart failure) ([place]) 12/02/2016  . Demand ischemia ([place]) 12/02/2016  . Moderate aortic stenosis 12/02/2016  . Hyperkalemia 12/02/2016  . Non-STEMI (non-ST elevated myocardial infarction) ([place]) 11/30/2016  . Acute diastolic CHF (congestive heart failure) ([place]) 11/27/2016  . Acute CHF ([place]) 11/27/2016  . Lymphedema 03/30/2016  . Left leg cellulitis 03/12/2016  . Insulin overdose 02/02/2016  . Hypoglycemia   . OSA on CPAP   . Depression   . Hypophosphatemia   . SOB (shortness of breath) 10/11/2015  . Elevated troponin 10/11/2015  . Chest pain at rest 09/24/2015  . Elevated PSA 07/26/2015  . Hypogonadism in male 07/26/2015  . 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 BPH with obstruction/lower urinary tract symptoms 06/29/2015  . Bipolar 1 disorder, mixed ([place]) 04/14/2015  . Borderline personality disorder 04/14/2015  . Acute renal failure ([place]) 11/15/2014  . Chest pain 11/14/2014  . Dizziness 11/05/2014  . Benign essential HTN 11/04/2014  . Combined fat and carbohydrate induced hyperlipemia 11/04/2014  . Bipolar 2 disorder ([place]) 10/27/2014  . Acquired hypothyroidism 02/03/2014  . Type 2 diabetes mellitus ([place]) 02/03/2014  . Adiposity 01/06/2014  . Obesity, diabetes, and hypertension syndrome ([place]) 12/23/2013  . Long term current use of insulin ([place])  12/23/2013  . Type 2 diabetes mellitus with other diabetic neurological complication ([place]) 52/84/1324  . Disorder affecting the body's metabolism 12/23/2013  . Acute inflammation of the pancreas 11/18/2013  . Elevated cholesterol with elevated triglycerides 11/18/2013  . CAD (coronary artery disease), native coronary artery 09/26/2013  . Diabetes mellitus ([place]) 09/26/2013  . Acute non-ST elevation myocardial  infarction (NSTEMI) ([place]) 09/26/2013  . Non-ST elevation (NSTEMI) myocardial infarction [facility]) 09/26/2013     Past Surgical History:  Procedure Laterality Date  . AMPUTATION TOE Left 08/25/2016   Procedure: AMPUTATION TOE/ipj left 2nd toe;  Surgeon: Sharlotte Alamo, DPM;  Location: [facility] ORS;  Service: Podiatry;  Laterality: Left;  . CARDIAC CATHETERIZATION N/A 10/12/2015   Procedure: Left Heart Cath and Coronary Angiography;  Surgeon: Teodoro Spray, MD;  Location: [place] CV LAB;  Service: Cardiovascular;  Laterality: N/A;  . CORONARY ANGIOPLASTY WITH STENT PLACEMENT     x 6 stents  . FOOT SURGERY Right    fracture repair with screw  . LEFT HEART CATH AND CORONARY ANGIOGRAPHY Right 06/01/2016   Procedure: Left Heart Cath and Coronary Angiography;  Surgeon: Isaias Cowman, MD;  Location: [place] CV LAB;  Service: Cardiovascular;  Laterality: Right;  . LEFT HEART CATH AND CORONARY ANGIOGRAPHY N/A 11/28/2016   Procedure: LEFT HEART CATH AND CORONARY ANGIOGRAPHY and possible pci;  Surgeon: Yolonda Kida, MD;  Location: [place] CV LAB;  Service: Cardiovascular;  Laterality: N/A;  . MASTOIDECTOMY Right   . NASAL SEPTUM SURGERY    . penial inplant    . PENILE PROSTHESIS IMPLANT    . right mastoidectomy    . SCROTOPLASTY    . TONSILLECTOMY AND ADENOIDECTOMY       Prior to Admission medications   Medication Sig Start Date End Date Taking? Authorizing Provider  allopurinol (ZYLOPRIM) 100 MG tablet Take 100 mg by mouth daily.     [provider]  amLODipine (NORVASC) 5 MG tablet Take 5 mg by mouth at bedtime.  10/14/14   [provider]  apixaban (ELIQUIS) 5 MG TABS tablet Take 1 tablet (5 mg total) by mouth 2 (two) times daily. 06/02/16   Hillary Bow, MD  clopidogrel (PLAVIX) 75 MG tablet Take 75 mg by mouth daily. Reported on 07/26/2015    [provider]  colchicine 0.6 MG tablet Take 0.6 mg by mouth 2 (two) times daily as needed (gout flare).   11/08/15   [provider]  Continuous Blood Gluc Sensor (FREESTYLE LIBRE SENSOR SYSTEM) MISC Use 1 each as directed. Please place prior to office visit as directed 06/20/16   [provider]  diclofenac (VOLTAREN) 75 MG EC tablet Take 75 mg by mouth daily as needed (gout flare).  12/09/15   [provider]  divalproex (DEPAKOTE ER) 500 MG 24 hr tablet Take 1-2 tablets (500-1,000 mg total) by mouth 2 (two) times daily. Take 537m in morning and 10082min evening 08/24/16   Clapacs, JoMadie RenoMD  DULoxetine (CYMBALTA) 60 MG capsule Take 1 capsule (60 mg total) by mouth daily. 08/24/16   Clapacs, JoMadie RenoMD  esomeprazole (NEXIUM) 40 MG capsule Take 40 mg by mouth daily.  10/14/14   [provider]  fenofibrate (TRICOR) 145 MG tablet Take 145 mg by mouth daily.  12/23/15 12/22/16  [provider]  furosemide (LASIX) 40 MG tablet Take 1 tablet (40 mg total) by mouth daily. Patient taking differently: Take 40 mg by mouth daily. Pt takes medication every other  day 09/25/15   Loletha Grayer, MD  gemfibrozil (LOPID) 600 MG tablet gemfibrozil 600 mg tablet    [provider]  glucose 4 GM chewable tablet Chew 4 g by mouth as needed for low blood sugar.     [provider]  Insulin Disposable Pump (V-GO 30) KIT Inject 1.2 Units/hr into the skin every hour. 66 units/24 hrs (36 available bolus units--12 bolus units available per meals) 10/07/14   [provider]  insulin lispro (HUMALOG KWIKPEN) 100 UNIT/ML KiwkPen Inject into the skin See admin instructions. Used ONLY as needed if additional bolus units are needed (up to 36 units/24 hrs.)    [provider]  isosorbide mononitrate (IMDUR) 60 MG 24 hr tablet Take 60 mg by mouth daily.  05/09/16 05/09/17  [provider]  JUBLIA 10 % SOLN Apply 1 application topically daily. 10/31/16   [provider]  levothyroxine (SYNTHROID, LEVOTHROID) 50 MCG tablet Take 50 mcg by mouth daily  before breakfast.    [provider]  metFORMIN (GLUCOPHAGE) 1000 MG tablet Take 1,000 mg by mouth 2 (two) times daily with a meal. Morning & supper    [provider]  metoprolol succinate (TOPROL-XL) 50 MG 24 hr tablet Take 50 mg by mouth daily.  09/11/14   [provider]  nitroGLYCERIN (NITROSTAT) 0.4 MG SL tablet Place 0.4 mg under the tongue every 5 (five) minutes x 3 doses as needed for chest pain.     [provider]  pregabalin (LYRICA) 100 MG capsule Take 100 mg by mouth 3 (three) times daily before meals.     [provider]  rOPINIRole (REQUIP) 2 MG tablet Take 2 mg by mouth 2 (two) times daily.  04/27/15   [provider]  rosuvastatin (CRESTOR) 20 MG tablet Take 20 mg by mouth at bedtime.     [provider]  VICTOZA 18 MG/3ML SOPN Inject 1.8 mg into the skin daily.  09/07/14   [provider]  vitamin C (ASCORBIC ACID) 500 MG tablet Take 500 mg by mouth daily.    [provider]     Allergies Patient has no known allergies.   Family History  Problem Relation Age of Onset  . Lung cancer Mother   . Heart attack Father   . Post-traumatic stress disorder Father   . Anxiety disorder Father   . Depression Father   . Heart attack Sister   . Heart attack Brother   . Prostate cancer Neg Hx   . Kidney cancer Neg Hx     Social History Social History  Substance Use Topics  . Smoking status: Never Smoker  . Smokeless tobacco: Never Used  . Alcohol use No    Review of Systems  Constitutional:   No fever or chills.  ENT:   No sore throat. No rhinorrhea. Cardiovascular:   No chest pain or syncope. Respiratory:   Positive as above shortness of breath and nonproductive cough. Gastrointestinal:   Negative for abdominal pain, vomiting and diarrhea.  Musculoskeletal:   Negative for focal pain or swelling All other systems reviewed and are negative except as documented above in ROS and  HPI.  ____________________________________________   PHYSICAL EXAM:  VITAL SIGNS: ED Triage Vitals  Enc Vitals Group     BP 12/07/16 0621 (!) 104/51     Pulse Rate 12/07/16 0621 71     Resp 12/07/16 0621 (!) 24     Temp 12/07/16 0621 98.1 F (36.7 C)  Temp Source 12/07/16 0621 Oral     SpO2 12/07/16 0621 92 %     Weight 12/07/16 0614 285 lb (129.3 kg)     Height 12/07/16 0614 6' 2"  (1.88 m)     Head Circumference --      Peak Flow --      Pain Score --      Pain Loc --      Pain Edu? --      Excl. in GC? --   Oxygen saturation 88% on room air. 92-94% on 3 L nasal cannula.  Vital signs reviewed, nursing assessments reviewed.   Constitutional:   Alert and oriented. Not in distress Eyes:   No scleral icterus.  EOMI. No nystagmus. No conjunctival pallor. PERRL. ENT   Head:   Normocephalic and atraumatic.   Nose:   No congestion/rhinnorhea.    Mouth/Throat:   MMM, no pharyngeal erythema. No peritonsillar mass.    Neck:   No meningismus. Full ROM Hematological/Lymphatic/Immunilogical:   No cervical lymphadenopathy. Cardiovascular:   RRR. Symmetric bilateral radial and DP pulses.  No murmurs.  Respiratory:  Tachypnea. Good air entry in all lung fields, rales diffusely in bilateral lower and midlung fields.. Gastrointestinal:   Soft and nontender. Non distended. There is no CVA tenderness.  No rebound, rigidity, or guarding. Genitourinary:   deferred Musculoskeletal:   Normal range of motion in all extremities. No joint effusions.  No lower extremity tenderness.  No edema. Neurologic:   Normal speech and language.  Motor grossly intact. No gross focal neurologic deficits are appreciated.  Skin:    Skin is warm, dry and intact. No rash noted.  No petechiae, purpura, or bullae.  ____________________________________________    LABS (pertinent positives/negatives) (all labs ordered are listed, but only abnormal results are displayed) Labs Reviewed  CBC WITH  DIFFERENTIAL/PLATELET - Abnormal; Notable for the following:       Result Value   RBC 3.58 (*)    Hemoglobin 10.5 (*)    HCT 31.0 (*)    RDW 18.2 (*)    Neutro Abs 6.6 (*)    All other components within normal limits  BASIC METABOLIC PANEL - Abnormal; Notable for the following:    Glucose, Bld 361 (*)    BUN 31 (*)    Creatinine, Ser 1.42 (*)    GFR calc non Af Amer 51 (*)    GFR calc Af Amer 59 (*)    All other components within normal limits  TROPONIN I - Abnormal; Notable for the following:    Troponin I 1.77 (*)    All other components within normal limits  BRAIN NATRIURETIC PEPTIDE   ____________________________________________   EKG  Interpreted by me Sinus rhythm rate of 73, normal axis and intervals. Poor R-wave progression in anterior precordial leads. ST depression and T-wave inversions in the lateral leads. Similar to previous 11/27/2016, consistent with demand ischemia  ____________________________________________    RADIOLOGY  Dg Chest 2 View  Result Date: 12/07/2016 CLINICAL DATA:  Shortness of breath.  Chest pressure.  Dyspnea. EXAM: CHEST  2 VIEW COMPARISON:  Radiographs and CT 1 week prior 11/30/2016 FINDINGS: Unchanged heart size and mediastinal contours. Worsening pulmonary edema from prior exam. No confluent airspace disease. No pleural fluid. No pneumothorax. No acute osseous abnormality. IMPRESSION: Worsening pulmonary edema from prior exam. Electronically Signed   By: Jeb Levering M.D.   On: 12/07/2016 06:53    ____________________________________________   PROCEDURES Procedures  ____________________________________________   INITIAL IMPRESSION /  ASSESSMENT AND PLAN / ED COURSE  Pertinent labs & imaging results that were available during my care of the patient were reviewed by me and considered in my medical decision making (see chart for details).  Patient presents with shortness of breath, found to have worsening pulmonary edema with  acute hypoxic respiratory failure and acute worsening of chronically elevated troponin level providing evidence of worsening heart strain again. I doubt ACS such as non-STEMI or unstable angina. Recent CTA negative for PE. Patient with known severe CAD, prone to demand ischemia as determined by recent cardiology evaluation. Plan to hospitalize the patient again, IV Lasix. Supplemental oxygen. Close cardiac monitoring. Patient does not use oxygen at home but now has a new oxygen requirement related to worsened pulmonary edema causing room air hypoxia to 88%.   Clinical Course as of Dec 08 1307  Thu Dec 07, 2016  0300 Case d/w hospitalist, who rec. Attempted diuresis in ED. Pt has enhanced in-home support from his insurance company, can f/u with HF clinic if hypoxia can be resolved. Pt chest too wide for heart failure vest trial.   [PS]  9233 `  [PS]  0800 D/w Cards Dr. [name], agrees with diuresis for now, recommends re-eval after that point.  He points out that pt has pretty significant underlying disease and may require repeat hospitalization if he does not quickly improve despite his recent admission.  [PS]  0805 OBSERVATION CARE: This patient is being placed under observation care for the following reasons: Congestive heart failure requiring IV diuresis and reassessment to evaluate response to initial treatment    [PS]  1115 Patient feels better. Has output approximately 1500 mL's of urine so far. Sitting upright, calm comfortable and conversant. Supplemental O2 discontinued, oxygenation remains in the high 90s. We'll do a trial of ambulation and check a delta troponin.  [PS]  1304 D/w cards [name] again. In light of recent cath results and improvement with diuresis, no benefit to repeat hospitalization. Important of follow up tomorrow d/w pt, who agrees to go to heart failure clinic. Pt notes his lasix is prescribed for every other day. I will go ahead and change this to daily. Pt agrees.   [PS]   0076 END OF OBSERVATION STATUS: After an appropriate period of observation, this patient is being discharged due to the following reason(s):  Improvement of symptoms and resolution of respiratory failure with diuresis    [PS]    Clinical Course User Index [PS] Carrie Mew, MD    ____________________________________________   FINAL CLINICAL IMPRESSION(S) / ED DIAGNOSES  Final diagnoses:  Acute on chronic diastolic congestive heart failure ([place])  Acute pulmonary edema ([place])  Acute respiratory failure with hypoxia ([place])      New Prescriptions   No medications on file     Portions of this note were generated with dragon dictation software. Dictation errors may occur despite best attempts at proofreading.    Carrie Mew, MD 12/07/16 [place], MD 12/07/16 [phone]

## 2016-12-07 NOTE — ED Notes (Signed)
Pt placed on ZOLL monitor

## 2016-12-07 NOTE — Progress Notes (Signed)
eLink Physician-Brief Progress Note Patient Name: Dennis Mullen DOB: Aug 21, 1950 MRN: [id number]   Date of Service  12/07/2016  HPI/Events of Note  Admitted with chf/ raf on cardizem drip Appears comfortable lying almost flat on camera rounds/ hemodynamically stable with sats fine on 2lpm  eICU Interventions  No intervention needed/ will monitor     Intervention Category Major Interventions: Arrhythmia - evaluation and management  Christinia Gully 12/07/2016, 10:46 PM

## 2016-12-08 ENCOUNTER — Other Ambulatory Visit: Payer: Self-pay

## 2016-12-08 ENCOUNTER — Inpatient Hospital Stay: Payer: Medicare Other

## 2016-12-08 DIAGNOSIS — I480 Paroxysmal atrial fibrillation: Secondary | ICD-10-CM

## 2016-12-08 LAB — CBC
HEMATOCRIT: 30.7 % — AB (ref 40.0–52.0)
HEMOGLOBIN: 10.2 g/dL — AB (ref 13.0–18.0)
MCH: 28.2 pg (ref 26.0–34.0)
MCHC: 33.2 g/dL (ref 32.0–36.0)
MCV: 84.9 fL (ref 80.0–100.0)
Platelets: 347 10*3/uL (ref 150–440)
RBC: 3.62 MIL/uL — AB (ref 4.40–5.90)
RDW: 18.3 % — ABNORMAL HIGH (ref 11.5–14.5)
WBC: 10.2 10*3/uL (ref 3.8–10.6)

## 2016-12-08 LAB — GLUCOSE, CAPILLARY
GLUCOSE-CAPILLARY: 519 mg/dL — AB (ref 65–99)
GLUCOSE-CAPILLARY: 143 mg/dL — AB (ref 65–99)
GLUCOSE-CAPILLARY: 249 mg/dL — AB (ref 65–99)
GLUCOSE-CAPILLARY: 228 mg/dL — AB (ref 65–99)
Glucose-Capillary: 492 mg/dL — ABNORMAL HIGH (ref 65–99)
Glucose-Capillary: 280 mg/dL — ABNORMAL HIGH (ref 65–99)
Glucose-Capillary: 311 mg/dL — ABNORMAL HIGH (ref 65–99)
Glucose-Capillary: 278 mg/dL — ABNORMAL HIGH (ref 65–99)

## 2016-12-08 LAB — BASIC METABOLIC PANEL
ANION GAP: 9 (ref 5–15)
BUN: 33 mg/dL — ABNORMAL HIGH (ref 6–20)
CALCIUM: 9.2 mg/dL (ref 8.9–10.3)
CHLORIDE: 101 mmol/L (ref 101–111)
CO2: 28 mmol/L (ref 22–32)
Creatinine, Ser: 1.35 mg/dL — ABNORMAL HIGH (ref 0.61–1.24)
GFR calc non Af Amer: 54 mL/min — ABNORMAL LOW (ref >60)
Glucose, Bld: 282 mg/dL — ABNORMAL HIGH (ref 65–99)
POTASSIUM: 4.2 mmol/L (ref 3.5–5.1)
Sodium: 138 mmol/L (ref 135–145)

## 2016-12-08 LAB — TROPONIN I
Troponin I: 11.28 ng/mL (ref <0.03)
Troponin I: 13.67 ng/mL (ref <0.03)
Troponin I: 12.22 ng/mL (ref <0.03)

## 2016-12-08 LAB — HEPARIN LEVEL (UNFRACTIONATED)
HEPARIN UNFRACTIONATED: 0.76 [IU]/mL — AB (ref 0.30–0.70)
Heparin Unfractionated: 0.55 IU/mL (ref 0.30–0.70)
Heparin Unfractionated: 0.44 IU/mL (ref 0.30–0.70)

## 2016-12-08 LAB — MAGNESIUM: Magnesium: 2.1 mg/dL (ref 1.7–2.4)

## 2016-12-08 LAB — POTASSIUM: Potassium: 3 mmol/L — ABNORMAL LOW (ref 3.5–5.1)

## 2016-12-08 MED ORDER — AMIODARONE HCL IN DEXTROSE 360-4.14 MG/200ML-% IV SOLN: 60.0000 mg/h | INTRAVENOUS | Status: DC

## 2016-12-08 MED ORDER — SODIUM CHLORIDE 0.9 % IV BOLUS (SEPSIS)
1000.0000 mL | Freq: Once | INTRAVENOUS | Status: AC
  Administered 2016-12-08: 1000 mL via INTRAVENOUS

## 2016-12-08 MED ORDER — DILTIAZEM HCL 30 MG PO TABS
30.0000 mg | ORAL_TABLET | Freq: Four times a day (QID) | ORAL | Status: DC
  Administered 2016-12-08 (×2): 30 mg via ORAL
  Filled 2016-12-08 (×2): qty 1

## 2016-12-08 MED ORDER — INSULIN ASPART 100 UNIT/ML ~~LOC~~ SOLN: 4.0000 [IU] | Freq: Three times a day (TID) | SUBCUTANEOUS | Status: DC

## 2016-12-08 MED ORDER — POTASSIUM CHLORIDE 10 MEQ/100ML IV SOLN
10.0000 meq | INTRAVENOUS | Status: AC
  Administered 2016-12-08 (×4): 10 meq via INTRAVENOUS
  Filled 2016-12-08 (×4): qty 100

## 2016-12-08 MED ORDER — SODIUM CHLORIDE 0.45 % IV BOLUS: 1000.0000 mL | Freq: Once | INTRAVENOUS | Status: DC

## 2016-12-08 MED ORDER — AMIODARONE HCL IN DEXTROSE 360-4.14 MG/200ML-% IV SOLN
60.0000 mg/h | INTRAVENOUS | Status: AC
  Administered 2016-12-08 (×2): 60 mg/h via INTRAVENOUS
  Filled 2016-12-08: qty 200

## 2016-12-08 MED ORDER — INSULIN GLARGINE 100 UNIT/ML ~~LOC~~ SOLN
20.0000 [IU] | Freq: Every day | SUBCUTANEOUS | Status: DC
  Filled 2016-12-08 (×2): qty 0.2

## 2016-12-08 MED ORDER — ASPIRIN 81 MG PO CHEW
81.0000 mg | CHEWABLE_TABLET | Freq: Once | ORAL | Status: AC
  Administered 2016-12-08: 81 mg via ORAL
  Filled 2016-12-08: qty 1

## 2016-12-08 MED ORDER — INSULIN ASPART 100 UNIT/ML ~~LOC~~ SOLN
2.0000 [IU] | SUBCUTANEOUS | Status: DC
  Administered 2016-12-08 (×2): 6 [IU] via SUBCUTANEOUS
  Administered 2016-12-09 (×2): 4 [IU] via SUBCUTANEOUS
  Administered 2016-12-09: 6 [IU] via SUBCUTANEOUS
  Administered 2016-12-09: 4 [IU] via SUBCUTANEOUS
  Administered 2016-12-09: 6 [IU] via SUBCUTANEOUS
  Filled 2016-12-08 (×7): qty 1

## 2016-12-08 MED ORDER — AMIODARONE HCL IN DEXTROSE 360-4.14 MG/200ML-% IV SOLN: 30.0000 mg/h | INTRAVENOUS | Status: DC

## 2016-12-08 MED ORDER — AMIODARONE HCL IN DEXTROSE 360-4.14 MG/200ML-% IV SOLN
30.0000 mg/h | INTRAVENOUS | Status: DC
  Administered 2016-12-09: 30 mg/h via INTRAVENOUS
  Filled 2016-12-08: qty 200

## 2016-12-08 MED ORDER — AMIODARONE LOAD VIA INFUSION
150.0000 mg | Freq: Once | INTRAVENOUS | Status: DC
  Filled 2016-12-08: qty 83.34

## 2016-12-08 MED FILL — Medication: Qty: 1 | Status: AC

## 2016-12-08 NOTE — Progress Notes (Signed)
Chaplain responded to an overhead Code Blue RR page for pt in 238A. [place] met pt and RR team attending to pt. No family was present. Pt was transferred to ICU Rm11 and is being stabilized. [place] provided support and  compassionate presence to pt and the RR team.    12/08/16 1400  Clinical Encounter Type  Visited With Patient;Health care provider  Visit Type Follow-up;Code;Other (Comment)  Referral From Nurse  Consult/Referral To [place] (Comment)

## 2016-12-08 NOTE — Progress Notes (Addendum)
Inpatient Diabetes Program Recommendations  AACE/ADA: New Consensus Statement on Inpatient Glycemic Control (2015)  Target Ranges:  Prepandial:   less than 140 mg/dL      Peak postprandial:   less than 180 mg/dL (1-2 hours)      Critically ill patients:  140 - 180 mg/dL   Results for Dennis Mullen, Dennis Mullen (MRN [id number]) as of 12/08/2016 09:35  Ref. Range 12/07/2016 21:15 12/07/2016 22:17 12/07/2016 22:38 12/07/2016 23:16 12/08/2016 01:46 12/08/2016 07:42  Glucose-Capillary Latest Ref Range: 65 - 99 mg/dL 475 (H) 470 (H) 419 (H) 422 (H) 280 (H) 143 (H)    Admit with: CP/ A Fib with RVR  History: DM  Home DM Meds: VGO 30 Disposable Insulin Pump (provides patient 30 units basal insulin per 24 hour period and patient takes 6 "clicks" that total 12 units bolus insulin per meal)       Victoza 1.8 mg daily       Metformin 1000 mg BID  Current Insulin Orders: Novolog Moderate Correction Scale/ SSI (0-15 units) TID AC + HS      Victoza 1.8 mg daily      MD- Patient saw his Endocrinologist (Dr. Eddie Dibbles with [facility]) on 11/07/16.  At that visit, patient was instructed to continue his current diabetes regimen that includes the following:  1. VGO 30 Disposable Insulin Pump (provides patient 30 units basal insulin per day and patient takes 6 "clicks" that total 12 units bolus insulin per meal) 2. Victoza 1.8 mg daily 3. Metformin 1000 mg BID    Please consider the following:  1. Start Lantus 30 units daily (please start this AM)  2. Start Novolog Meal Coverage: Novolog 6 units TID with meals (50% total home dose to start)     --Will follow patient during hospitalization--  Wyn Quaker RN, MSN, CDE Diabetes Coordinator Inpatient Glycemic Control Team Team Pager: [phone] (8a-5p)

## 2016-12-08 NOTE — Consult Note (Signed)
[facility] Consultation Note  Patient ID: Dennis Mullen, MRN: [id number], DOB/AGE: October 01, 1950 66 y.o. Admit date: 12/07/2016   Date of Consult: 12/08/2016 Primary Physician: Lavera Guise, MD Primary Cardiologist: [name]  Chief Complaint:  Chief Complaint  Patient presents with  . Code STEMI   Reason for Consult: non-ST elevation myocardial infarction with heart failure  HPI: 66 y.o. male with known coronary artery disease status post multiple stents and multiple arteries throughout the past years who has had to diastolic dysfunction heart failure in the past essential hypertension makes hyperlipidemia diabetes with complication who is had multiple admissions in the last several weeks for acute on chronic diastolic dysfunction heart failure as well as elevated troponin. During his last admission the patient had severe shortness of breath and pulmonary edema for which he received appropriate intravenous Lasix with improvements. His troponin was mildly elevated and he underwent cardiac catheterization showing normal LV systolic function with ejection fraction of 50% and patent stents and no evidence of large vessel coronary artery atherosclerosis requiring further intervention. The patient does have small vessel disease consistent with diabetes. He has continued on appropriate medication management. He was readmitted at this admission for continued shortness of breath and chest discomfort consistent with cardiovascular findings given diuresis which is significantly improved his symptoms. He also had atrial fibrillation with the rapid ventricular rate for which show he was placed on amiodarone drip. With that amiodarone drip he felt much better and he had spontaneously converted to normal sinus rhythm. He was transferred to the floor and then for unknown reasons had very rapid rate of his atrial fibrillation which the changed to ventricular tachycardia and/or ventricular Doren Custard. The  patient was cardioverted at that time with chest compressions and ACLS protocol was performed. The patient responded very quickly and was converted back into normal sinus rhythm. Since then he's been somewhat hypoxic with some hypotension of unknown etiology. EKG currently shows diffuse ST depressions and T-wave changes slightly worse than previous. Some of these changes were seen with previous admission. His troponin level has elevated to 11 consistent with a non-ST elevation myocardial infarction. Currently he has some hypotension requiring medication management  Past Medical History:  Diagnosis Date  . Abnormal levels of other serum enzymes   . Anxiety   . Bipolar disorder ([place])   . BPH (benign prostatic hyperplasia)   . CAD (coronary artery disease)   . CHF (congestive heart failure) ([place])    ?  Marland Kitchen Depression   . Diabetes mellitus, type II ([place])   . Diabetes mellitus, type II, insulin dependent ([place])   . ED (erectile dysfunction)   . GERD (gastroesophageal reflux disease)   . Gout   . History of borderline personality disorder   . Hypertension   . Hypogonadism in male   . Hypothyroidism   . Insomnia   . Mood disorder ([place])   . Neuropathy   . Obesity   . OSA (obstructive sleep apnea)   . PVD (peripheral vascular disease) ([place])   . Thyroid disease       Surgical History:  Past Surgical History:  Procedure Laterality Date  . AMPUTATION TOE Left 08/25/2016   Procedure: AMPUTATION TOE/ipj left 2nd toe;  Surgeon: Sharlotte Alamo, DPM;  Location: [facility];  Service: Podiatry;  Laterality: Left;  . CARDIAC CATHETERIZATION N/A 10/12/2015   Procedure: Left Heart Cath and Coronary Angiography;  Surgeon: Teodoro Spray, MD;  Location: [facility];  Service: Cardiovascular;  Laterality:  N/A;  . CORONARY ANGIOPLASTY WITH STENT PLACEMENT     x 6 stents  . FOOT SURGERY Right    fracture repair with screw  . LEFT HEART CATH AND CORONARY ANGIOGRAPHY Right 06/01/2016   Procedure: Left  Heart Cath and Coronary Angiography;  Surgeon: Isaias Cowman, MD;  Location: [facility];  Service: Cardiovascular;  Laterality: Right;  . LEFT HEART CATH AND CORONARY ANGIOGRAPHY N/A 11/28/2016   Procedure: LEFT HEART CATH AND CORONARY ANGIOGRAPHY and possible pci;  Surgeon: Yolonda Kida, MD;  Location: [facility];  Service: Cardiovascular;  Laterality: N/A;  . MASTOIDECTOMY Right   . NASAL SEPTUM SURGERY    . penial inplant    . PENILE PROSTHESIS IMPLANT    . right mastoidectomy    . SCROTOPLASTY    . TONSILLECTOMY AND ADENOIDECTOMY       Home Meds: Prior to Admission medications   Medication Sig Start Date End Date Taking? Authorizing Provider  allopurinol (ZYLOPRIM) 100 MG tablet Take 100 mg by mouth daily.    Yes [provider]  amLODipine (NORVASC) 5 MG tablet Take 5 mg by mouth at bedtime.  10/14/14  Yes [provider]  apixaban (ELIQUIS) 5 MG TABS tablet Take 1 tablet (5 mg total) by mouth 2 (two) times daily. 06/02/16  Yes Sudini, Alveta Heimlich, MD  clopidogrel (PLAVIX) 75 MG tablet Take 75 mg by mouth daily. Reported on 07/26/2015   Yes [provider]  colchicine 0.6 MG tablet Take 0.6 mg by mouth 2 (two) times daily as needed (gout flare).  11/08/15  Yes [provider]  Continuous Blood Gluc Sensor (FREESTYLE LIBRE SENSOR SYSTEM) MISC Use 1 each as directed. Please place prior to office visit as directed 06/20/16  Yes [provider]  diclofenac (VOLTAREN) 75 MG EC tablet Take 75 mg by mouth daily as needed (gout flare).  12/09/15  Yes [provider]  divalproex (DEPAKOTE ER) 500 MG 24 hr tablet Take 1-2 tablets (500-1,000 mg total) by mouth 2 (two) times daily. Take 570m in morning and 1005min evening 08/24/16  Yes Clapacs, JoMadie RenoMD  DULoxetine (CYMBALTA) 60 MG capsule Take 1 capsule (60 mg total) by mouth daily. 08/24/16  Yes Clapacs, JoMadie RenoMD  esomeprazole (NEXIUM) 40 MG capsule Take 40 mg by mouth  daily.  10/14/14  Yes [provider]  fenofibrate (TRICOR) 145 MG tablet Take 145 mg by mouth daily.  12/23/15 12/22/16 Yes [provider]  furosemide (LASIX) 40 MG tablet Take 1 tablet (40 mg total) by mouth daily. 12/07/16  Yes StCarrie MewMD  gemfibrozil (LOPID) 600 MG tablet gemfibrozil 600 mg tablet   Yes [provider]  glucose 4 GM chewable tablet Chew 4 g by mouth as needed for low blood sugar.    Yes [provider]  Insulin Disposable Pump (V-GO 30) KIT Inject 1.2 Units/hr into the skin every hour. 66 units/24 hrs (36 available bolus units--12 bolus units available per meals) 10/07/14  Yes [provider]  insulin lispro (HUMALOG KWIKPEN) 100 UNIT/ML KiwkPen Inject into the skin See admin instructions. Used ONLY as needed if additional bolus units are needed (up to 36 units/24 hrs.)   Yes [provider]  isosorbide mononitrate (IMDUR) 60 MG 24 hr tablet Take 60 mg by mouth daily.  05/09/16 05/09/17 Yes [provider]  JUBLIA 10 % SOLN Apply 1 application topically daily. 10/31/16  Yes [provider]  levothyroxine (SYNTHROID, [place]  50 MCG tablet Take 50 mcg by mouth daily before breakfast.   Yes [provider]  metFORMIN (GLUCOPHAGE) 1000 MG tablet Take 1,000 mg by mouth 2 (two) times daily with a meal. Morning & supper   Yes [provider]  metoprolol succinate (TOPROL-XL) 50 MG 24 hr tablet Take 50 mg by mouth daily.  09/11/14  Yes [provider]  nitroGLYCERIN (NITROSTAT) 0.4 MG SL tablet Place 0.4 mg under the tongue every 5 (five) minutes x 3 doses as needed for chest pain.    Yes [provider]  pregabalin (LYRICA) 100 MG capsule Take 100 mg by mouth 3 (three) times daily before meals.    Yes [provider]  rOPINIRole (REQUIP) 2 MG tablet Take 2 mg by mouth 2 (two) times daily.  04/27/15  Yes [provider]  rosuvastatin (CRESTOR) 20 MG tablet  Take 20 mg by mouth at bedtime.    Yes [provider]  VICTOZA 18 MG/3ML SOPN Inject 1.8 mg into the skin daily.  09/07/14  Yes [provider]  vitamin C (ASCORBIC ACID) 500 MG tablet Take 500 mg by mouth daily.   Yes [provider]    Inpatient Medications:  . allopurinol  100 mg Oral Daily  . clopidogrel  75 mg Oral Daily  . divalproex  1,000 mg Oral QHS  . divalproex  500 mg Oral Daily  . DULoxetine  60 mg Oral Daily  . fenofibrate  54 mg Oral Daily  . furosemide  40 mg Oral Daily  . insulin aspart  0-15 Units Subcutaneous TID WC  . insulin aspart  0-5 Units Subcutaneous QHS  . insulin aspart  4 Units Subcutaneous TID WC  . insulin glargine  20 Units Subcutaneous Daily  . isosorbide mononitrate  60 mg Oral Daily  . levothyroxine  50 mcg Oral QAC breakfast  . metoprolol succinate  50 mg Oral Daily  . pantoprazole  40 mg Oral Daily  . pregabalin  100 mg Oral TID AC  . rOPINIRole  2 mg Oral BID  . rosuvastatin  20 mg Oral QHS  . sodium chloride flush  3 mL Intravenous Q12H  . vitamin C  500 mg Oral Daily   . sodium chloride Stopped (12/07/16 2127)  . sodium chloride    . amiodarone 60 mg/hr (12/08/16 1413)  . amiodarone    . heparin 1,350 Units/hr (12/08/16 1116)  . sodium chloride 1,000 mL (12/08/16 1504)    Allergies: No Known Allergies  Social History   Social History  . Marital status: Single    Spouse name: N/A  . Number of children: N/A  . Years of education: N/A   Occupational History  . retired    Social History Main Topics  . Smoking status: Never Smoker  . Smokeless tobacco: Never Used  . Alcohol use No  . Drug use: No  . Sexual activity: Not Currently    Birth control/ protection: None   Other Topics Concern  . Not on file   Social History Narrative   ** Merged History Encounter **         Family History  Problem Relation Age of [place]  . Lung cancer Mother   . Heart attack Father   . Post-traumatic stress  disorder Father   . Anxiety disorder Father   . Depression Father   . Heart attack Sister   . Heart attack Brother   . Prostate cancer Neg Hx   . Kidney  cancer Neg Hx      Review of Systems  Unable to assess due to obtundation  Labs:  Recent Labs  12/07/16 1836 12/07/16 2252 12/08/16 0202 12/08/16 1016  TROPONINI 1.20* 6.51* 11.28* 13.67*   Lab Results  Component Value Date   WBC 10.2 12/08/2016   HGB 10.2 (L) 12/08/2016   HCT 30.7 (L) 12/08/2016   MCV 84.9 12/08/2016   PLT 347 12/08/2016    Recent Labs Lab 12/07/16 1836 12/08/16 0202  NA 135 138  K 4.5 4.2  CL 97* 101  CO2 24 28  BUN 32* 33*  CREATININE 1.63* 1.35*  CALCIUM 9.6 9.2  PROT 8.0  --   BILITOT 0.8  --   ALKPHOS 69  --   ALT 17  --   AST 31  --   GLUCOSE 494* 282*   Lab Results  Component Value Date   CHOL 183 12/07/2016   HDL 34 (L) 12/07/2016   LDLCALC 74 12/07/2016   TRIG 377 (H) 12/07/2016   No results found for: DDIMER  Radiology/Studies:  Dg Chest 2 View  Result Date: 12/07/2016 CLINICAL DATA:  Shortness of breath.  Chest pressure.  Dyspnea. EXAM: CHEST  2 VIEW COMPARISON:  Radiographs and CT 1 week prior 11/30/2016 FINDINGS: Unchanged heart size and mediastinal contours. Worsening pulmonary edema from prior exam. No confluent airspace disease. No pleural fluid. No pneumothorax. No acute osseous abnormality. IMPRESSION: Worsening pulmonary edema from prior exam. Electronically Signed   By: Jeb Levering M.D.   On: 12/07/2016 06:53   Dg Chest 2 View  Result Date: 11/28/2016 CLINICAL DATA:  Chest pain, shortness of breath. EXAM: CHEST  2 VIEW COMPARISON:  Radiographs of November 27, 2016. FINDINGS: The heart size and mediastinal contours are within normal limits. Both lungs are clear. No pneumothorax or pleural effusion is noted. The visualized skeletal structures are unremarkable. IMPRESSION: No active cardiopulmonary disease. Pulmonary edema noted on prior exam has resolved.  Electronically Signed   By: Marijo Conception, M.D.   On: 11/28/2016 08:06   Dg Chest 2 View  Result Date: 11/27/2016 CLINICAL DATA:  Chest pain EXAM: CHEST  2 VIEW COMPARISON:  11/03/2016 FINDINGS: Borderline cardiomegaly. There is generalized Kerley lines and fissure thickening. No effusion or pneumothorax. Negative aorta and hila. IMPRESSION: Pulmonary interstitial edema. Electronically Signed   By: Monte Fantasia M.D.   On: 11/27/2016 08:26   Ct Angio Chest Pe W And/or Wo Contrast  Result Date: 11/30/2016 CLINICAL DATA:  Acute onset of shortness of breath and generalized chest pressure. Initial encounter. EXAM: CT ANGIOGRAPHY CHEST WITH CONTRAST TECHNIQUE: Multidetector CT imaging of the chest was performed using the standard protocol during bolus administration of intravenous contrast. Multiplanar CT image reconstructions and MIPs were obtained to evaluate the vascular anatomy. CONTRAST:  75 mL of Isovue 370 IV contrast COMPARISON:  Chest radiograph performed earlier today at 4:27 a.m., and CTA of the chest performed 10/10/2014 FINDINGS: Cardiovascular: There is no evidence of significant pulmonary embolus. Evaluation for pulmonary embolus is somewhat suboptimal due to motion artifact. The heart remains normal in size. Diffuse coronary artery calcifications are seen. Scattered calcification is seen along the aortic arch. The proximal great vessels are grossly unremarkable in appearance. Mediastinum/Nodes: Mildly enlarged right paratracheal and superior mediastinal nodes measure up to 1.2 cm in short axis. No pericardial effusion is identified. The thyroid gland is unremarkable. No axillary lymphadenopathy is seen. Lungs/Pleura: Diffuse interstitial prominence is noted, with hazy bilateral airspace opacities, compatible with  pulmonary edema. No pleural effusion or pneumothorax is seen. No dominant mass is identified. Upper Abdomen: The visualized portions of the liver and spleen are unremarkable, aside  from an apparent calcified granuloma within the spleen. The visualized portions of the gallbladder, pancreas, adrenal glands and stomach are within normal limits. Musculoskeletal: No acute osseous abnormalities are identified. The visualized musculature is unremarkable in appearance. Review of the MIP images confirms the above findings. IMPRESSION: 1. No evidence of significant pulmonary embolus. 2. Diffuse interstitial prominence and hazy bilateral airspace opacities are compatible with pulmonary edema. 3. Diffuse coronary artery calcifications seen. 4. Mildly enlarged mediastinal nodes measure up to 1.2 cm in short axis. Electronically Signed   By: Garald Balding M.D.   On: 11/30/2016 06:19   Dg Chest Port 1 View  Result Date: 12/08/2016 CLINICAL DATA:  Status post cardiac arrest EXAM: PORTABLE CHEST 1 VIEW COMPARISON:  12/07/2016 FINDINGS: There is mild bilateral interstitial prominence. There is no focal parenchymal opacity. There is no pleural effusion or pneumothorax. There is stable cardiomegaly. The osseous structures are unremarkable. IMPRESSION: Cardiomegaly with mild pulmonary vascular congestion. Electronically Signed   By: Kathreen Devoid   On: 12/08/2016 14:36   Dg Chest Port 1 View  Result Date: 12/07/2016 CLINICAL DATA:  Patient with sudden onset centralized chest pain. Shortness of breath. EXAM: PORTABLE CHEST 1 VIEW COMPARISON:  Chest radiograph 12/07/2016. FINDINGS: Pacer apparatus overlies the left hemithorax. Stable cardiomegaly. Bilateral perihilar interstitial pulmonary opacities. No pleural effusion or pneumothorax. IMPRESSION: Cardiomegaly with bilateral interstitial opacities favored to represent pulmonary edema, similar to prior. Electronically Signed   By: Lovey Newcomer M.D.   On: 12/07/2016 18:48   Dg Chest Port 1 View  Result Date: 11/30/2016 CLINICAL DATA:  Acute onset of shortness of breath and generalized chest pressure. Initial encounter. EXAM: PORTABLE CHEST 1 VIEW  COMPARISON:  Chest radiograph performed 11/28/2016 FINDINGS: The lungs are well-aerated. Vascular congestion is noted. Increased interstitial markings raise concern for mild interstitial edema. There is no evidence of pleural effusion or pneumothorax. The cardiomediastinal silhouette is borderline normal in size. No acute osseous abnormalities are seen. IMPRESSION: Vascular congestion. Increased interstitial markings raise concern for mild interstitial edema. Electronically Signed   By: Garald Balding M.D.   On: 11/30/2016 05:00    EKG: Normal sinus rhythm with diffuse ST depression  Weights: Filed Weights   12/07/16 1824 12/07/16 2300 12/08/16 1429  Weight: 129.3 kg (285 lb) 129 kg (284 lb 6.3 oz) 127 kg (279 lb 15.8 oz)     Physical Exam: Blood pressure (!) 85/50, pulse (!) 58, temperature 97.6 F (36.4 C), temperature source Axillary, resp. rate (!) 26, height 6' 2"  (1.88 m), weight 127 kg (279 lb 15.8 oz), SpO2 93 %. Body mass index is 35.95 kg/m. General: Well developed, well nourished, in no acute distress. Head eyes ears nose throat: Normocephalic, atraumatic, sclera non-icteric, no xanthomas, nares are without discharge. No apparent thyromegaly and/or mass  Lungs: Normal respiratory effort. Some wheezes,Few basilar rales, no rhonchi.  Heart: RRR with normal S1 S2. no murmur gallop, no rub, PMI is normal size and placement, carotid upstroke normal without bruit, jugular venous pressure is normal Abdomen: Soft, non-tender, -distended with normoactive bowel sounds. No hepatomegaly. No rebound/guarding. No obvious abdominal masses. Abdominal aorta is normal size without bruit Extremities: Trace edema. no cyanosis, no clubbing, no ulcers  Peripheral : 2+ bilateral upper extremity pulses, 2+ bilateral femoral pulses, 2+ bilateral dorsal pedal pulse Neuro: Not Alert and oriented. No  facial asymmetry. No focal deficit. Moves all extremities spontaneously. Musculoskeletal: Normal muscle tone  without kyphosis Psych:  Does not Responds to questions appropriately with a normal affect.    Assessment: 66 year old male with diabetes hypertension hyperlipidemia coronary artery disease status post multiple stents and recurrent diastolic dysfunction congestive heart failure with new onset non-ST elevation myocardial infarction and atrial fibrillation with rapid ventricular rate with episode of ventricular tachycardia and ventricular fibrillation slightly improved with medical management  Plan: 1. Continue supportive care for hypotension with pressures as necessary 2. Continue anticoagulation for further risk reduction in stroke with atrial fibrillation using heparin at this time if further if further intervention and/or diagnostics 3. Continue amiodarone drip for maintenance of normal sinus rhythm and ventricular tachycardia ventricular fibrillation 4. Repeat echocardiogram for further evaluation of the LV systolic dysfunction with non-ST elevation myocardial infarction 5. Furosemide as necessary for pulmonary edema and congestive heart failure 6. Further diagnostic testing and treatment options after above  Signed, Corey Skains M.D. [facility] 12/08/2016, 3:09 PM

## 2016-12-08 NOTE — Progress Notes (Signed)
I was on the unit when the patient was noted to be unresponsive by the nursing staff. He was noted to be in ventricular tachycardia on telemetry. CPR was initiated. Pads were applied to his chest. Rhythm was ventricular fibrillation at this point. The patient was shocked once with 200 J and converted to sinus rhythm. There was still no pulse and he was given 1 mg of epinephrine. CPR was continued for about 5 minutes and then there was restoration of pulse. The patient was responsive at this point. He will be transferred to the ICU. Critical care time of 20 minutes.  Dr. Clayborn Bigness was notified by Dr. Edwina Barth.

## 2016-12-08 NOTE — Discharge Instructions (Signed)
Heart Failure Clinic appointment on December 15 2016 at 9:00am with Darylene Price, [place]. Please call [phone] to reschedule.

## 2016-12-08 NOTE — Plan of Care (Signed)
Son was notified of the patient being unresponsive. He stated he will come by in the evening

## 2016-12-08 NOTE — Progress Notes (Signed)
Pulmonary/critical care  CODE BLUE called on patient Cardiology was present noted the patient to be in ventricular tachycardia/fibrillation, CPR was begun and defibrillation. CPR total was 5 minutes with epinephrine given. Please see CODE BLUE note. Patient had return of spontaneous circulation, is presently in atrial fibrillation with a ventricular response of 90, is awake and communicating and responsive. Being moved to the intensive care unit. Cardiology has been notified presently at bedside. EKG obtained revealed diffuse ST segment depressions which has changed from prior tracing. Patient was started back on amiodarone and subsequent converted back to normal sinus rhythm with a ventricular response of 60-70. His blood pressure is now 75/50 and is receiving a fluid bolus. Chest x-ray reveals clear lung fields without any evidence of pneumothorax post CPR. BMP, electrolytes to include magnesium calcium and phosphorus along with cardiac enzymes CBC and coagulation profile sent. Appreciate cardiology input regarding further intervention Patient will be continued on anticoagulation, unable to give any additional medicine secondary to borderline blood pressure.  Dennis Mullen, D.O.

## 2016-12-08 NOTE — Progress Notes (Signed)
   12/08/16 1430  Vitals  BP (!) 78/52  MAP (mmHg) (!) 61  Pulse Rate 61  ECG Heart Rate 61  Resp 20  Oxygen Therapy  SpO2 97 %  O2 Device Non-rebreather Mask  O2 Flow Rate (L/min) 15 L/min  Pulse Oximetry Type Continuous     Post 1 litre NS bolus

## 2016-12-08 NOTE — Progress Notes (Signed)
   12/08/16 1500  Vitals  Temp 97.6 F (36.4 C)  Temp Source Axillary  BP (!) 85/50  MAP (mmHg) (!) 61  Pulse Rate (!) 58  ECG Heart Rate (!) 57  Resp (!) 26  Oxygen Therapy  SpO2 93 %  O2 Device Nasal Cannula  O2 Flow Rate (L/min) 4 L/min  Pulse Oximetry Type Continuous  End [place] CO2 (EtCO2) 21

## 2016-12-08 NOTE — Progress Notes (Signed)
Patient suddenly was unresponsive in a ventricular fibrillation rhythm at this time. Code blue initiated. Chest compressions started immediately. Spontaneous respirations eventually resumed, patient quickly transferred to the ICU. Bedside report given. Wenda Low [facility]

## 2016-12-08 NOTE — Progress Notes (Signed)
ANTICOAGULATION CONSULT NOTE - Initial Consult  Pharmacy Consult for Heparin Indication: chest pain/ACS  No Known Allergies  Patient Measurements: Height: 6' 2"  (188 cm) Weight: 284 lb 6.3 oz (129 kg) IBW/kg (Calculated) : 82.2 Heparin Dosing Weight: 110  Vital Signs: Temp: 97.9 F (36.6 C) (08/23 2300) Temp Source: Oral (08/23 2300) BP: 160/80 (08/23 2145) Pulse Rate: 66 (08/23 2145)  Labs:  Recent Labs  12/07/16 0618  12/07/16 1836 12/07/16 1912 12/07/16 2252 12/08/16 0202  HGB 10.5*  --  11.0*  --   --  10.2*  HCT 31.0*  --  34.2*  --   --  30.7*  PLT 324  --  345  --   --  347  APTT  --   --   --  69*  --   --   LABPROT  --   --   --  15.3*  --   --   INR  --   --   --  1.20  --   --   HEPARINUNFRC  --   --   --  1.04*  --  0.76*  CREATININE 1.42*  --  1.63*  --   --  1.35*  TROPONINI 1.77*  < > 1.20*  --  6.51* 11.28*  < > = values in this interval not displayed.  Estimated Creatinine Clearance: 78.9 mL/min (A) (by C-G formula based on SCr of 1.35 mg/dL (H)).   Medical History: Past Medical History:  Diagnosis Date  . Abnormal levels of other serum enzymes   . Anxiety   . Bipolar disorder ([place])   . BPH (benign prostatic hyperplasia)   . CAD (coronary artery disease)   . CHF (congestive heart failure) ([place])    ?  Marland Kitchen Depression   . Diabetes mellitus, type II ([place])   . Diabetes mellitus, type II, insulin dependent ([place])   . ED (erectile dysfunction)   . GERD (gastroesophageal reflux disease)   . Gout   . History of borderline personality disorder   . Hypertension   . Hypogonadism in male   . Hypothyroidism   . Insomnia   . Mood disorder ([place])   . Neuropathy   . Obesity   . OSA (obstructive sleep apnea)   . PVD (peripheral vascular disease) ([place])   . Thyroid disease     Medications:  Prescriptions Prior to Admission  Medication Sig Dispense Refill Last Dose  . allopurinol (ZYLOPRIM) 100 MG tablet Take 100 mg by mouth daily.    12/06/2016 at  0800  . amLODipine (NORVASC) 5 MG tablet Take 5 mg by mouth at bedtime.    12/06/2016 at 2000  . apixaban (ELIQUIS) 5 MG TABS tablet Take 1 tablet (5 mg total) by mouth 2 (two) times daily. 60 tablet 0 12/06/2016 at 1800  . clopidogrel (PLAVIX) 75 MG tablet Take 75 mg by mouth daily. Reported on 07/26/2015   12/06/2016 at 0800  . colchicine 0.6 MG tablet Take 0.6 mg by mouth 2 (two) times daily as needed (gout flare).    PRN at PRN  . Continuous Blood Gluc Sensor (FREESTYLE LIBRE SENSOR SYSTEM) MISC Use 1 each as directed. Please place prior to office visit as directed   Taking  . diclofenac (VOLTAREN) 75 MG EC tablet Take 75 mg by mouth daily as needed (gout flare).    PRN at PRN  . divalproex (DEPAKOTE ER) 500 MG 24 hr tablet Take 1-2 tablets (500-1,000 mg total) by mouth 2 (two) times  daily. Take 564m in morning and 10077min evening 270 tablet 1 12/06/2016 at 2000  . DULoxetine (CYMBALTA) 60 MG capsule Take 1 capsule (60 mg total) by mouth daily. 90 capsule 1 12/06/2016 at 0800  . esomeprazole (NEXIUM) 40 MG capsule Take 40 mg by mouth daily.    12/06/2016 at 0800  . fenofibrate (TRICOR) 145 MG tablet Take 145 mg by mouth daily.    12/06/2016 at 0800  . furosemide (LASIX) 40 MG tablet Take 1 tablet (40 mg total) by mouth daily. 30 tablet 0 12/06/2016 at 0800  . gemfibrozil (LOPID) 600 MG tablet gemfibrozil 600 mg tablet   12/06/2016 at 0800  . glucose 4 GM chewable tablet Chew 4 g by mouth as needed for low blood sugar.    PRN at PRN  . Insulin Disposable Pump (V-GO 30) KIT Inject 1.2 Units/hr into the skin every hour. 66 units/24 hrs (36 available bolus units--12 bolus units available per meals)   12/07/2016 at Unknown time  . insulin lispro (HUMALOG KWIKPEN) 100 UNIT/ML KiwkPen Inject into the skin See admin instructions. Used ONLY as needed if additional bolus units are needed (up to 36 units/24 hrs.)   PRN at PRN  . isosorbide mononitrate (IMDUR) 60 MG 24 hr tablet Take 60 mg by mouth daily.     12/06/2016 at 0800  . JUBLIA 10 % SOLN Apply 1 application topically daily.   12/06/2016 at Unknown time  . levothyroxine (SYNTHROID, LEVOTHROID) 50 MCG tablet Take 50 mcg by mouth daily before breakfast.   12/06/2016 at 0800  . metFORMIN (GLUCOPHAGE) 1000 MG tablet Take 1,000 mg by mouth 2 (two) times daily with a meal. Morning & supper   12/06/2016 at 2000  . metoprolol succinate (TOPROL-XL) 50 MG 24 hr tablet Take 50 mg by mouth daily.    12/06/2016 at 0800  . nitroGLYCERIN (NITROSTAT) 0.4 MG SL tablet Place 0.4 mg under the tongue every 5 (five) minutes x 3 doses as needed for chest pain.    PRN at PRN  . pregabalin (LYRICA) 100 MG capsule Take 100 mg by mouth 3 (three) times daily before meals.    12/06/2016 at 2000  . rOPINIRole (REQUIP) 2 MG tablet Take 2 mg by mouth 2 (two) times daily.    12/06/2016 at 2000  . rosuvastatin (CRESTOR) 20 MG tablet Take 20 mg by mouth at bedtime.    12/06/2016 at 2000  . VICTOZA 18 MG/3ML SOPN Inject 1.8 mg into the skin daily.    12/06/2016 at PRN  . vitamin C (ASCORBIC ACID) 500 MG tablet Take 500 mg by mouth daily.   12/06/2016 at 0800   Scheduled:  . allopurinol  100 mg Oral Daily  . amLODipine  5 mg Oral QHS  . clopidogrel  75 mg Oral Daily  . divalproex  1,000 mg Oral QHS  . divalproex  500 mg Oral Daily  . DULoxetine  60 mg Oral Daily  . fenofibrate  54 mg Oral Daily  . furosemide  40 mg Oral Daily  . insulin aspart  0-15 Units Subcutaneous TID WC  . insulin aspart  0-5 Units Subcutaneous QHS  . isosorbide mononitrate  60 mg Oral Daily  . levothyroxine  50 mcg Oral QAC breakfast  . liraglutide  1.8 mg Subcutaneous Daily  . metoprolol succinate  50 mg Oral Daily  . pantoprazole  40 mg Oral Daily  . pregabalin  100 mg Oral TID AC  . rOPINIRole  2 mg Oral BID  .  rosuvastatin  20 mg Oral QHS  . sodium chloride flush  3 mL Intravenous Q12H  . V-GO 30  1.2 Units/hr Subcutaneous Q1H  . vitamin C  500 mg Oral Daily   Infusions:  . sodium chloride  Stopped (12/07/16 2127)  . sodium chloride    . amiodarone 30 mg/hr (12/07/16 2155)  . amiodarone    . heparin 1,450 Units/hr (12/07/16 1958)    Assessment: Pharmacy consulted to dose and monitor heparin in this 66 year old male being treated for ACS. Patient takes apixaban as an outpatient. Last dose of apixaban was 1800 on 8/22.  Goal of Therapy:  Heparin level 0.3-0.7 units/ml Monitor platelets by anticoagulation protocol: Yes   Plan:  Since patient's last dose of apixaban was >24 hours, apixaban likely no longer hanging around therefore will bolus patient with heparin. Will order heparin bolus of 4000 units x 1 and will start heparin gtt @ 1450 units/hr.Will check Heparin level @ 0200.    8/24 0200 heparin level 0.76. Decrease rate to 1350 units/hr. Recheck in 6 hours.  Sim Boast, PharmD, BCPS  12/08/16 3:34 AM      Carinne Brandenburger S 12/08/2016,3:32 AM

## 2016-12-08 NOTE — Progress Notes (Addendum)
ANTICOAGULATION CONSULT NOTE - Consult  Pharmacy Consult for Heparin Indication: chest pain/ACS  No Known Allergies  Patient Measurements: Height: 6' 2"  (188 cm) Weight: 279 lb 15.8 oz (127 kg) IBW/kg (Calculated) : 82.2 Heparin Dosing Weight: 110  Vital Signs: Temp: 97.6 F (36.4 C) (08/24 1500) Temp Source: Axillary (08/24 1500) BP: 95/57 (08/24 1640) Pulse Rate: 55 (08/24 1640)  Labs:  Recent Labs  12/07/16 0618  12/07/16 1836  12/07/16 1912  12/08/16 0202 12/08/16 1016 12/08/16 1419 12/08/16 1620  HGB 10.5*  --  11.0*  --   --   --  10.2*  --   --   --   HCT 31.0*  --  34.2*  --   --   --  30.7*  --   --   --   PLT 324  --  345  --   --   --  347  --   --   --   APTT  --   --   --   --  69*  --   --   --   --   --   LABPROT  --   --   --   --  15.3*  --   --   --   --   --   INR  --   --   --   --  1.20  --   --   --   --   --   HEPARINUNFRC  --   --   --   < > 1.04*  --  0.76* 0.55  --  0.44  CREATININE 1.42*  --  1.63*  --   --   --  1.35*  --   --   --   TROPONINI 1.77*  < > 1.20*  --   --   < > 11.28* 13.67* 12.22*  --   < > = values in this interval not displayed.  Estimated Creatinine Clearance: 78.3 mL/min (A) (by C-G formula based on SCr of 1.35 mg/dL (H)).   Medical History: Past Medical History:  Diagnosis Date  . Abnormal levels of other serum enzymes   . Anxiety   . Bipolar disorder ([place])   . BPH (benign prostatic hyperplasia)   . CAD (coronary artery disease)   . CHF (congestive heart failure) ([place])    ?  Marland Kitchen Depression   . Diabetes mellitus, type II ([place])   . Diabetes mellitus, type II, insulin dependent ([place])   . ED (erectile dysfunction)   . GERD (gastroesophageal reflux disease)   . Gout   . History of borderline personality disorder   . Hypertension   . Hypogonadism in male   . Hypothyroidism   . Insomnia   . Mood disorder ([place])   . Neuropathy   . Obesity   . OSA (obstructive sleep apnea)   . PVD (peripheral vascular disease)  ([place])   . Thyroid disease     Medications:  Prescriptions Prior to Admission  Medication Sig Dispense Refill Last Dose  . allopurinol (ZYLOPRIM) 100 MG tablet Take 100 mg by mouth daily.    12/06/2016 at 0800  . amLODipine (NORVASC) 5 MG tablet Take 5 mg by mouth at bedtime.    12/06/2016 at 2000  . apixaban (ELIQUIS) 5 MG TABS tablet Take 1 tablet (5 mg total) by mouth 2 (two) times daily. 60 tablet 0 12/06/2016 at 1800  . clopidogrel (PLAVIX) 75 MG tablet Take 75 mg by  mouth daily. Reported on 07/26/2015   12/06/2016 at 0800  . colchicine 0.6 MG tablet Take 0.6 mg by mouth 2 (two) times daily as needed (gout flare).    PRN at PRN  . Continuous Blood Gluc Sensor (FREESTYLE LIBRE SENSOR SYSTEM) MISC Use 1 each as directed. Please place prior to office visit as directed   Taking  . diclofenac (VOLTAREN) 75 MG EC tablet Take 75 mg by mouth daily as needed (gout flare).    PRN at PRN  . divalproex (DEPAKOTE ER) 500 MG 24 hr tablet Take 1-2 tablets (500-1,000 mg total) by mouth 2 (two) times daily. Take 590m in morning and 10086min evening 270 tablet 1 12/06/2016 at 2000  . DULoxetine (CYMBALTA) 60 MG capsule Take 1 capsule (60 mg total) by mouth daily. 90 capsule 1 12/06/2016 at 0800  . esomeprazole (NEXIUM) 40 MG capsule Take 40 mg by mouth daily.    12/06/2016 at 0800  . fenofibrate (TRICOR) 145 MG tablet Take 145 mg by mouth daily.    12/06/2016 at 0800  . furosemide (LASIX) 40 MG tablet Take 1 tablet (40 mg total) by mouth daily. 30 tablet 0 12/06/2016 at 0800  . gemfibrozil (LOPID) 600 MG tablet gemfibrozil 600 mg tablet   12/06/2016 at 0800  . glucose 4 GM chewable tablet Chew 4 g by mouth as needed for low blood sugar.    PRN at PRN  . Insulin Disposable Pump (V-GO 30) KIT Inject 1.2 Units/hr into the skin every hour. 66 units/24 hrs (36 available bolus units--12 bolus units available per meals)   12/07/2016 at Unknown time  . insulin lispro (HUMALOG KWIKPEN) 100 UNIT/ML KiwkPen Inject into the skin  See admin instructions. Used ONLY as needed if additional bolus units are needed (up to 36 units/24 hrs.)   PRN at PRN  . isosorbide mononitrate (IMDUR) 60 MG 24 hr tablet Take 60 mg by mouth daily.    12/06/2016 at 0800  . JUBLIA 10 % SOLN Apply 1 application topically daily.   12/06/2016 at Unknown time  . levothyroxine (SYNTHROID, LEVOTHROID) 50 MCG tablet Take 50 mcg by mouth daily before breakfast.   12/06/2016 at 0800  . metFORMIN (GLUCOPHAGE) 1000 MG tablet Take 1,000 mg by mouth 2 (two) times daily with a meal. Morning & supper   12/06/2016 at 2000  . metoprolol succinate (TOPROL-XL) 50 MG 24 hr tablet Take 50 mg by mouth daily.    12/06/2016 at 0800  . nitroGLYCERIN (NITROSTAT) 0.4 MG SL tablet Place 0.4 mg under the tongue every 5 (five) minutes x 3 doses as needed for chest pain.    PRN at PRN  . pregabalin (LYRICA) 100 MG capsule Take 100 mg by mouth 3 (three) times daily before meals.    12/06/2016 at 2000  . rOPINIRole (REQUIP) 2 MG tablet Take 2 mg by mouth 2 (two) times daily.    12/06/2016 at 2000  . rosuvastatin (CRESTOR) 20 MG tablet Take 20 mg by mouth at bedtime.    12/06/2016 at 2000  . VICTOZA 18 MG/3ML SOPN Inject 1.8 mg into the skin daily.    12/06/2016 at PRN  . vitamin C (ASCORBIC ACID) 500 MG tablet Take 500 mg by mouth daily.   12/06/2016 at 0800   Scheduled:  . allopurinol  100 mg Oral Daily  . clopidogrel  75 mg Oral Daily  . divalproex  1,000 mg Oral QHS  . divalproex  500 mg Oral Daily  . DULoxetine  60 mg Oral Daily  . fenofibrate  54 mg Oral Daily  . furosemide  40 mg Oral Daily  . insulin aspart  2-6 Units Subcutaneous Q4H  . isosorbide mononitrate  60 mg Oral Daily  . levothyroxine  50 mcg Oral QAC breakfast  . metoprolol succinate  50 mg Oral Daily  . pantoprazole  40 mg Oral Daily  . pregabalin  100 mg Oral TID AC  . rOPINIRole  2 mg Oral BID  . rosuvastatin  20 mg Oral QHS  . sodium chloride flush  3 mL Intravenous Q12H  . vitamin C  500 mg Oral Daily    Infusions:  . sodium chloride Stopped (12/07/16 2127)  . sodium chloride    . amiodarone 60 mg/hr (12/08/16 1634)  . amiodarone    . heparin 1,350 Units/hr (12/08/16 1634)  . potassium chloride 10 mEq (12/08/16 1755)    Assessment: Pharmacy consulted to dose and monitor heparin in this 66 year old male being treated for ACS. Patient takes apixaban as an outpatient. Last dose of apixaban was 1800 on 8/22.  Goal of Therapy:  Heparin level 0.3-0.7 units/ml Monitor platelets by anticoagulation protocol: Yes   Plan:  8/24 1600 heparin level 0.44. Will continue heparin at current rate of 1350 units/hr. Recheck HL tomorrow AM.   8/24 0200 heparin level 0.76. Decrease rate to 1350 units/hr. Recheck in 6 hours. 8/24 1000 heparin level 0.55. Will continue heparin at current rate of 1350 units/hr. Recheck HL in 6 hours.   [place] Resident  12/08/2016,6:25 PM    8/25 AM heparin level 0.38. Continue current regimen. Recheck heparin level and CBC with tomorrow AM labs.  Sim Boast, PharmD, BCPS  12/09/16 5:11 AM

## 2016-12-08 NOTE — Progress Notes (Signed)
   12/08/16 1610  Vitals  BP (!) 97/58  MAP (mmHg) 71  Pulse Rate (!) 58  ECG Heart Rate (!) 58  Resp (!) 21  Oxygen Therapy  SpO2 95 %  O2 Device Venturi Mask  O2 Flow Rate (L/min) 10 L/min  FiO2 (%) 45 %  Pulse Oximetry Type Continuous  End Tidal CO2 (EtCO2) 23     Post 2nd litre NS bolus

## 2016-12-08 NOTE — Progress Notes (Signed)
[facility] [place] NAME: Dennis Mullen    MR#:  [id number]  DATE OF BIRTH:  03-05-1951  SUBJECTIVE:  CHIEF COMPLAINT:   Chief Complaint  Patient presents with  . Code STEMI   Chest pain has resolved. On 2 L oxygen. On amiodarone and heparin drip.  REVIEW OF SYSTEMS:    Review of Systems  Constitutional: Positive for malaise/fatigue. Negative for chills and fever.  HENT: Negative for sore throat.   Eyes: Negative for blurred vision, double vision and pain.  Respiratory: Positive for cough and shortness of breath. Negative for hemoptysis and wheezing.   Cardiovascular: Positive for chest pain. Negative for palpitations, orthopnea and leg swelling.  Gastrointestinal: Negative for abdominal pain, constipation, diarrhea, heartburn, nausea and vomiting.  Genitourinary: Negative for dysuria and hematuria.  Musculoskeletal: Negative for back pain and joint pain.  Skin: Negative for rash.  Neurological: Positive for weakness. Negative for sensory change, speech change, focal weakness and headaches.  Endo/Heme/Allergies: Does not bruise/bleed easily.  Psychiatric/Behavioral: Negative for depression. The patient is not nervous/anxious.     DRUG ALLERGIES:  No Known Allergies  VITALS:  Blood pressure 136/65, pulse 62, temperature 97.6 F (36.4 C), temperature source Oral, resp. rate (!) 23, height 6\' 2"  (1.88 m), weight 129 kg (284 lb 6.3 oz), SpO2 94 %.  PHYSICAL EXAMINATION:   Physical Exam  GENERAL:  66 y.o.-year-old patient lying in the bed with no acute distress.  EYES: Pupils equal, round, reactive to light and accommodation. No scleral icterus. Extraocular muscles intact.  HEENT: Head atraumatic, normocephalic. Oropharynx and nasopharynx clear.  NECK:  Supple, no jugular venous distention. No thyroid enlargement, no tenderness.  LUNGS: Normal breath sounds bilaterally,Decreased air entry CARDIOVASCULAR: S1, S2 normal. No murmurs,  rubs, or gallops.  ABDOMEN: Soft, nontender, nondistended. Bowel sounds present. No organomegaly or mass.  EXTREMITIES: No cyanosis, clubbing or edema b/l.    NEUROLOGIC: Cranial nerves II through XII are intact. No focal Motor or sensory deficits b/l.   PSYCHIATRIC: The patient is alert and oriented x 3.  SKIN: No obvious rash, lesion, or ulcer.   LABORATORY PANEL:   CBC  Recent Labs Lab 12/08/16 0202  WBC 10.2  HGB 10.2*  HCT 30.7*  PLT 347   ------------------------------------------------------------------------------------------------------------------ Chemistries   Recent Labs Lab 12/07/16 1836 12/08/16 0202  NA 135 138  K 4.5 4.2  CL 97* 101  CO2 24 28  GLUCOSE 494* 282*  BUN 32* 33*  CREATININE 1.63* 1.35*  CALCIUM 9.6 9.2  AST 31  --   ALT 17  --   ALKPHOS 69  --   BILITOT 0.8  --    ------------------------------------------------------------------------------------------------------------------  Cardiac Enzymes  Recent Labs Lab 12/08/16 0202  TROPONINI 11.28*   ------------------------------------------------------------------------------------------------------------------  RADIOLOGY:  Dg Chest 2 View  Result Date: 12/07/2016 CLINICAL DATA:  Shortness of breath.  Chest pressure.  Dyspnea. EXAM: CHEST  2 VIEW COMPARISON:  Radiographs and CT 1 week prior 11/30/2016 FINDINGS: Unchanged heart size and mediastinal contours. Worsening pulmonary edema from prior exam. No confluent airspace disease. No pleural fluid. No pneumothorax. No acute osseous abnormality. IMPRESSION: Worsening pulmonary edema from prior exam. Electronically Signed   By: Jeb Levering M.D.   On: 12/07/2016 06:53   Dg Chest Port 1 View  Result Date: 12/07/2016 CLINICAL DATA:  Patient with sudden onset centralized chest pain. Shortness of breath. EXAM: PORTABLE CHEST 1 VIEW COMPARISON:  Chest radiograph 12/07/2016. FINDINGS: Pacer apparatus overlies the  left hemithorax. Stable  cardiomegaly. Bilateral perihilar interstitial pulmonary opacities. No pleural effusion or pneumothorax. IMPRESSION: Cardiomegaly with bilateral interstitial opacities favored to represent pulmonary edema, similar to prior. Electronically Signed   By: Lovey Newcomer M.D.   On: 12/07/2016 18:48    ASSESSMENT AND PLAN:   * Atrial fibrillation with rapid ventricular rate. Patient continues on amiodarone drip and improved. On heparin drip.  * Non-ST elevation MI versus elevation of troponin due to demand ischemia Patient had recent cardiac catheterization with moderate CAD in multiple vessels. Medical management advised. This was unchanged from his catheterization in 02/ 2018. Seen by Dr. Clayborn Bigness in the emergency room yesterday due to concern for ST elevation MI. Discussed with Dr. Clayborn Bigness. Advised consulting Dr. [name]. We will repeat troponin On Plavix, statin, beta blocker.  * Acute on chronic diastolic congestive heart failure. On Lasix. Monitor input and output.  * Acute hypoxic respiratory failure due to CHF. Wean oxygen as tolerated. Nebulizers when necessary.  * Diabetes mellitus type 2. Sliding scale insulin. Consult diabetes coordinator patient is on insulin pump at home  * CKD stage III. Monitor while on Lasix.  All the records are reviewed and case discussed with Care Management/Social Worker Management plans discussed with the patient, family and they are in agreement.  CODE STATUS: FULL CODE  DVT Prophylaxis: SCDs  TOTAL TIME TAKING CARE OF THIS PATIENT: 35 minutes.   POSSIBLE D/C IN 2-3 DAYS, DEPENDING ON CLINICAL CONDITION.  Hillary Bow R M.D on 12/08/2016 at 9:51 AM  Between 7am to 6pm - Pager - [phone]  After 6pm go to [email or web address] - password [email or web address] [facility]  Office  [phone]  CC: Primary care physician; Lavera Guise, MD  Note: This dictation was prepared with Dragon dictation along with smaller phrase technology. Any  transcriptional errors that result from this process are unintentional.

## 2016-12-08 NOTE — Progress Notes (Signed)
Nurse asked [place] to visit with pt whom she described at lonely and could benefit from [facility] visit. CH met pt, pt talked bout his life, he talked bout interpersonal relationship, he talked about his father and sister who died of heart attacked and his mother of died of cancer. Pt was animated and talked about his professional life and church life. Pt stated he was alone and sometimes feel lonely, but was pleasant to talk with, and seemed to have need for someone to talk with. [facility] provided pastoral support and a ministry of presence.

## 2016-12-08 NOTE — Progress Notes (Signed)
   12/08/16 1500  Vitals  BP (!) 85/50  MAP (mmHg) (!) 61  Pulse Rate (!) 58  ECG Heart Rate (!) 57  Resp (!) 26  Oxygen Therapy  SpO2 93 %  O2 Device Nasal Cannula  O2 Flow Rate (L/min) 4 L/min  Pulse Oximetry Type Continuous  End Tidal CO2 (EtCO2) 21     2nd litre bolus NS started

## 2016-12-08 NOTE — Progress Notes (Signed)
ANTICOAGULATION CONSULT NOTE - Initial Consult  Pharmacy Consult for Heparin Indication: chest pain/ACS  No Known Allergies  Patient Measurements: Height: _0  (188 cm) Weight: 284 lb 6.3 oz (129 kg) IBW/kg (Calculated) : 82.2 Heparin Dosing Weight: 110  Vital Signs: Temp: 98.1 F (36.7 C) (08/24 1150) Temp Source: Oral (08/24 1150) BP: 115/47 (08/24 1150) Pulse Rate: 63 (08/24 1150)  Labs:  Recent Labs  12/07/16 0618  12/07/16 1836 12/07/16 1912 12/07/16 2252 12/08/16 0202 12/08/16 1016  HGB 10.5*  --  11.0*  --   --  10.2*  --   HCT 31.0*  --  34.2*  --   --  30.7*  --   PLT 324  --  345  --   --  347  --   APTT  --   --   --  69*  --   --   --   LABPROT  --   --   --  15.3*  --   --   --   INR  --   --   --  1.20  --   --   --   HEPARINUNFRC  --   --   --  1.04*  --  0.76* 0.55  CREATININE 1.42*  --  1.63*  --   --  1.35*  --   TROPONINI 1.77*  < > 1.20*  --  6.51* 11.28* 13.67*  < > = values in this interval not displayed.  Estimated Creatinine Clearance: 78.9 mL/min (A) (by C-G formula based on SCr of 1.35 mg/dL (H)).   Medical History: Past Medical History:  Diagnosis Date  . Abnormal levels of other serum enzymes   . Anxiety   . Bipolar disorder ([place])   . BPH (benign prostatic hyperplasia)   . CAD (coronary artery disease)   . CHF (congestive heart failure) ([place])    ?  [place] Kitchen Depression   . Diabetes mellitus, type II ([place])   . Diabetes mellitus, type II, insulin dependent ([place])   . ED (erectile dysfunction)   . GERD (gastroesophageal reflux disease)   . Gout   . History of borderline personality disorder   . Hypertension   . Hypogonadism in male   . Hypothyroidism   . Insomnia   . Mood disorder ([place])   . Neuropathy   . Obesity   . OSA (obstructive sleep apnea)   . PVD (peripheral vascular disease) ([place])   . Thyroid disease     Medications:  Prescriptions Prior to Admission  Medication Sig Dispense Refill Last Dose  . allopurinol  (ZYLOPRIM) 100 MG tablet Take 100 mg by mouth daily.    12/06/2016 at 0800  . amLODipine (NORVASC) 5 MG tablet Take 5 mg by mouth at bedtime.    12/06/2016 at 2000  . apixaban (ELIQUIS) 5 MG TABS tablet Take 1 tablet (5 mg total) by mouth 2 (two) times daily. 60 tablet 0 12/06/2016 at 1800  . clopidogrel (PLAVIX) 75 MG tablet Take 75 mg by mouth daily. Reported on 07/26/2015   12/06/2016 at 0800  . colchicine 0.6 MG tablet Take 0.6 mg by mouth 2 (two) times daily as needed (gout flare).    PRN at PRN  . Continuous Blood Gluc Sensor (FREESTYLE LIBRE SENSOR SYSTEM) MISC Use 1 each as directed. Please place prior to office visit as directed   Taking  . diclofenac (VOLTAREN) 75 MG EC tablet Take 75 mg by mouth daily as needed (gout flare).  PRN at PRN  . divalproex (DEPAKOTE ER) 500 MG 24 hr tablet Take 1-2 tablets (500-1,000 mg total) by mouth 2 (two) times daily. Take '500mg'$  in morning and '1000mg'$  in evening 270 tablet 1 12/06/2016 at 2000  . DULoxetine (CYMBALTA) 60 MG capsule Take 1 capsule (60 mg total) by mouth daily. 90 capsule 1 12/06/2016 at 0800  . esomeprazole (NEXIUM) 40 MG capsule Take 40 mg by mouth daily.    12/06/2016 at 0800  . fenofibrate (TRICOR) 145 MG tablet Take 145 mg by mouth daily.    12/06/2016 at 0800  . furosemide (LASIX) 40 MG tablet Take 1 tablet (40 mg total) by mouth daily. 30 tablet 0 12/06/2016 at 0800  . gemfibrozil (LOPID) 600 MG tablet gemfibrozil 600 mg tablet   12/06/2016 at 0800  . glucose 4 GM chewable tablet Chew 4 g by mouth as needed for low blood sugar.    PRN at PRN  . Insulin Disposable Pump (V-GO 30) KIT Inject 1.2 Units/hr into the skin every hour. 66 units/24 hrs (36 available bolus units--12 bolus units available per meals)   12/07/2016 at Unknown time  . insulin lispro (HUMALOG KWIKPEN) 100 UNIT/ML KiwkPen Inject into the skin See admin instructions. Used ONLY as needed if additional bolus units are needed (up to 36 units/24 hrs.)   PRN at PRN  . isosorbide  mononitrate (IMDUR) 60 MG 24 hr tablet Take 60 mg by mouth daily.    12/06/2016 at 0800  . JUBLIA 10 % SOLN Apply 1 application topically daily.   12/06/2016 at Unknown time  . levothyroxine (SYNTHROID, LEVOTHROID) 50 MCG tablet Take 50 mcg by mouth daily before breakfast.   12/06/2016 at 0800  . metFORMIN (GLUCOPHAGE) 1000 MG tablet Take 1,000 mg by mouth 2 (two) times daily with a meal. Morning & supper   12/06/2016 at 2000  . metoprolol succinate (TOPROL-XL) 50 MG 24 hr tablet Take 50 mg by mouth daily.    12/06/2016 at 0800  . nitroGLYCERIN (NITROSTAT) 0.4 MG SL tablet Place 0.4 mg under the tongue every 5 (five) minutes x 3 doses as needed for chest pain.    PRN at PRN  . pregabalin (LYRICA) 100 MG capsule Take 100 mg by mouth 3 (three) times daily before meals.    12/06/2016 at 2000  . rOPINIRole (REQUIP) 2 MG tablet Take 2 mg by mouth 2 (two) times daily.    12/06/2016 at 2000  . rosuvastatin (CRESTOR) 20 MG tablet Take 20 mg by mouth at bedtime.    12/06/2016 at 2000  . VICTOZA 18 MG/3ML SOPN Inject 1.8 mg into the skin daily.    12/06/2016 at PRN  . vitamin C (ASCORBIC ACID) 500 MG tablet Take 500 mg by mouth daily.   12/06/2016 at 0800   Scheduled:  . allopurinol  100 mg Oral Daily  . clopidogrel  75 mg Oral Daily  . diltiazem  30 mg Oral Q6H  . divalproex  1,000 mg Oral QHS  . divalproex  500 mg Oral Daily  . DULoxetine  60 mg Oral Daily  . fenofibrate  54 mg Oral Daily  . furosemide  40 mg Oral Daily  . insulin aspart  0-15 Units Subcutaneous TID WC  . insulin aspart  0-5 Units Subcutaneous QHS  . insulin aspart  4 Units Subcutaneous TID WC  . insulin glargine  20 Units Subcutaneous Daily  . isosorbide mononitrate  60 mg Oral Daily  . levothyroxine  50 mcg Oral QAC  breakfast  . metoprolol succinate  50 mg Oral Daily  . pantoprazole  40 mg Oral Daily  . pregabalin  100 mg Oral TID AC  . rOPINIRole  2 mg Oral BID  . rosuvastatin  20 mg Oral QHS  . sodium chloride flush  3 mL  Intravenous Q12H  . vitamin C  500 mg Oral Daily   Infusions:  . sodium chloride Stopped (12/07/16 2127)  . sodium chloride    . heparin 1,350 Units/hr (12/08/16 1116)    Assessment: Pharmacy consulted to dose and monitor heparin in this 66 year old male being treated for ACS. Patient takes apixaban as an outpatient. Last dose of apixaban was 1800 on 8/22.  Goal of Therapy:  Heparin level 0.3-0.7 units/ml Monitor platelets by anticoagulation protocol: Yes   Plan:  Since patient's last dose of apixaban was >24 hours, apixaban likely no longer hanging around therefore will bolus patient with heparin. Will order heparin bolus of 4000 units x 1 and will start heparin gtt @ 1450 units/hr.Will check Heparin level @ 0200.    8/24 0200 heparin level 0.76. Decrease rate to 1350 units/hr. Recheck in 6 hours. 8/24 1000 heparin level 0.55. Will continue heparin at current rate of 1350 units/hr. Recheck HL in 6 hours.   Lendon Ka, PharmD Pharmacy Resident  12/08/2016,2:01 PM

## 2016-12-08 NOTE — Plan of Care (Signed)
Patient suddenly was unresponsive in a ventricular fibrillation rhythm . Code blue lasted about 5 minutes. Pt transferred back to icu at this time.

## 2016-12-08 NOTE — Progress Notes (Signed)
[address] visited patient along with [place] to follow-up after an earlier code. Patient was talkative and he and his nurse were talking. CH provided emotional support. Patient has support of his family that had just come back from eating.    12/08/16 1523  Clinical Encounter Type  Visited With Patient;Health care provider  Visit Type Follow-up;Code;Other (Comment)  Referral From Nurse  Consult/Referral To Chaplain  Spiritual Encounters  Spiritual Needs Prayer;Emotional

## 2016-12-09 LAB — CBC
HCT: 30.7 % — ABNORMAL LOW (ref 40.0–52.0)
HEMOGLOBIN: 10.3 g/dL — AB (ref 13.0–18.0)
MCH: 29 pg (ref 26.0–34.0)
MCHC: 33.5 g/dL (ref 32.0–36.0)
MCV: 86.6 fL (ref 80.0–100.0)
PLATELETS: 381 10*3/uL (ref 150–440)
RBC: 3.54 MIL/uL — AB (ref 4.40–5.90)
RDW: 18 % — ABNORMAL HIGH (ref 11.5–14.5)
WBC: 10.2 10*3/uL (ref 3.8–10.6)

## 2016-12-09 LAB — GLUCOSE, CAPILLARY
GLUCOSE-CAPILLARY: 164 mg/dL — AB (ref 65–99)
GLUCOSE-CAPILLARY: 238 mg/dL — AB (ref 65–99)
GLUCOSE-CAPILLARY: 251 mg/dL — AB (ref 65–99)
Glucose-Capillary: 152 mg/dL — ABNORMAL HIGH (ref 65–99)
Glucose-Capillary: 200 mg/dL — ABNORMAL HIGH (ref 65–99)
Glucose-Capillary: 206 mg/dL — ABNORMAL HIGH (ref 65–99)
Glucose-Capillary: 301 mg/dL — ABNORMAL HIGH (ref 65–99)

## 2016-12-09 LAB — BASIC METABOLIC PANEL
Anion gap: 10 (ref 5–15)
BUN: 30 mg/dL — ABNORMAL HIGH (ref 6–20)
CALCIUM: 8.7 mg/dL — AB (ref 8.9–10.3)
CHLORIDE: 101 mmol/L (ref 101–111)
CO2: 24 mmol/L (ref 22–32)
CREATININE: 1.36 mg/dL — AB (ref 0.61–1.24)
GFR calc non Af Amer: 53 mL/min — ABNORMAL LOW (ref >60)
GLUCOSE: 170 mg/dL — AB (ref 65–99)
Potassium: 4.2 mmol/L (ref 3.5–5.1)
Sodium: 135 mmol/L (ref 135–145)

## 2016-12-09 LAB — HEPARIN LEVEL (UNFRACTIONATED): HEPARIN UNFRACTIONATED: 0.38 [IU]/mL (ref 0.30–0.70)

## 2016-12-09 MED ORDER — INSULIN ASPART 100 UNIT/ML ~~LOC~~ SOLN
0.0000 [IU] | Freq: Every day | SUBCUTANEOUS | Status: DC
  Administered 2016-12-09 – 2016-12-10 (×2): 3 [IU] via SUBCUTANEOUS
  Administered 2016-12-11 – 2016-12-12 (×2): 2 [IU] via SUBCUTANEOUS
  Filled 2016-12-09 (×4): qty 1

## 2016-12-09 MED ORDER — INSULIN ASPART 100 UNIT/ML ~~LOC~~ SOLN
0.0000 [IU] | Freq: Three times a day (TID) | SUBCUTANEOUS | Status: DC
  Administered 2016-12-10: 8 [IU] via SUBCUTANEOUS
  Administered 2016-12-10: 3 [IU] via SUBCUTANEOUS
  Administered 2016-12-10: 8 [IU] via SUBCUTANEOUS
  Administered 2016-12-11: 5 [IU] via SUBCUTANEOUS
  Administered 2016-12-11: 2 [IU] via SUBCUTANEOUS
  Administered 2016-12-11 – 2016-12-12 (×2): 3 [IU] via SUBCUTANEOUS
  Administered 2016-12-12: 5 [IU] via SUBCUTANEOUS
  Administered 2016-12-12: 3 [IU] via SUBCUTANEOUS
  Filled 2016-12-09 (×9): qty 1

## 2016-12-09 MED ORDER — MORPHINE SULFATE (PF) 2 MG/ML IV SOLN
1.0000 mg | INTRAVENOUS | Status: DC | PRN
  Administered 2016-12-09: 1 mg via INTRAVENOUS
  Filled 2016-12-09: qty 1

## 2016-12-09 MED ORDER — INSULIN ASPART 100 UNIT/ML ~~LOC~~ SOLN: 0.0000 [IU] | Freq: Three times a day (TID) | SUBCUTANEOUS | Status: DC

## 2016-12-09 MED ORDER — ALBUTEROL SULFATE (2.5 MG/3ML) 0.083% IN NEBU
2.5000 mg | INHALATION_SOLUTION | RESPIRATORY_TRACT | Status: DC | PRN
  Administered 2016-12-12: 2.5 mg via RESPIRATORY_TRACT
  Filled 2016-12-09: qty 3

## 2016-12-09 MED ORDER — AMIODARONE HCL 200 MG PO TABS
400.0000 mg | ORAL_TABLET | Freq: Two times a day (BID) | ORAL | Status: DC
  Administered 2016-12-09 – 2016-12-11 (×5): 400 mg via ORAL
  Filled 2016-12-09 (×5): qty 2

## 2016-12-09 NOTE — Progress Notes (Signed)
Pt oxygen sats dropped when he was asleep because he was having sleep apnea and is a mouth breather. Had to switch to the venturi mask again. Was able to tolerate that better than the nasal cannula.

## 2016-12-09 NOTE — Progress Notes (Signed)
Pt continue on heparin and amiodarone gtt. Complained of chest soreness all night and did not want to be medicated with Tylenol or Morphine. Reported that the pain was not different than what he felt earlier after having CPR. Remains alert and oriented. Spoke clearly with cardiology this AM. Had family at bedside last evening. Has been in a brady 1st degree AV block this shift.

## 2016-12-09 NOTE — Progress Notes (Signed)
[facility] Encounter Note  Patient: Dennis Mullen / Admit Date: 12/07/2016 / Date of Encounter: 12/09/2016, 6:45 AM   Subjective: Dennis Mullen has significant amount of chest soreness from ACLS protocol and chest compressions. Patient has continued EKG changes with diffuse ST depression and T-wave inversions slightly different from previous admission. Cardiac catheterization from previous admission evaluated showing patent stents and no evidence of progression of coronary disease requiring further intervention and ejection fraction 55%. Early the patient now back in normal sinus rhythm after amiodarone use and a hemodynamically more stable  Review of Systems: Positive for: Chest pain Negative for: Vision change, hearing change, syncope, dizziness, nausea, vomiting,diarrhea, bloody stool, stomach pain, cough, congestion, diaphoresis, urinary frequency, urinary pain,skin lesions, skin rashes Others previously listed  Objective: Telemetry: Normal sinus rhythm Physical Exam: Blood pressure 128/63, pulse (!) 59, temperature (!) 97.5 F (36.4 C), temperature source Axillary, resp. rate 18, height 6\' 2"  (1.88 m), weight 127 kg (279 lb 15.8 oz), SpO2 99 %. Body mass index is 35.95 kg/m. General: Well developed, well nourished, in no acute distress. Head: Normocephalic, atraumatic, sclera non-icteric, no xanthomas, nares are without discharge. Neck: No apparent masses Lungs: Normal respirations with some wheezes, no rhonchi, no rales , few basilar crackles   Heart: Regular rate and rhythm, normal S1 S2, no murmur, no rub, no gallop, PMI is normal size and placement, carotid upstroke normal without bruit, jugular venous pressure normal Abdomen: Soft, non-tender, non-distended with normoactive bowel sounds. No hepatosplenomegaly. Abdominal aorta is normal size without bruit Extremities: Trace edema, no clubbing, no cyanosis, no ulcers,  Peripheral: 2+ radial, 2+ femoral, 2+ dorsal  pedal pulses Neuro: Alert and oriented. Moves all extremities spontaneously. Psych:  Responds to questions appropriately with a normal affect.   Intake/Output Summary (Last 24 hours) at 12/09/16 0645 Last data filed at 12/09/16 0600  Gross per 24 hour  Intake          1531.79 ml  Output             1600 ml  Net           -68.21 ml    Inpatient Medications:  . allopurinol  100 mg Oral Daily  . clopidogrel  75 mg Oral Daily  . divalproex  1,000 mg Oral QHS  . divalproex  500 mg Oral Daily  . DULoxetine  60 mg Oral Daily  . fenofibrate  54 mg Oral Daily  . furosemide  40 mg Oral Daily  . insulin aspart  2-6 Units Subcutaneous Q4H  . isosorbide mononitrate  60 mg Oral Daily  . levothyroxine  50 mcg Oral QAC breakfast  . metoprolol succinate  50 mg Oral Daily  . pantoprazole  40 mg Oral Daily  . pregabalin  100 mg Oral TID AC  . rOPINIRole  2 mg Oral BID  . rosuvastatin  20 mg Oral QHS  . sodium chloride flush  3 mL Intravenous Q12H  . vitamin C  500 mg Oral Daily   Infusions:  . sodium chloride Stopped (12/07/16 2127)  . sodium chloride    . amiodarone 30 mg/hr (12/09/16 0420)  . heparin 1,350 Units/hr (12/08/16 1634)    Labs:  Recent Labs  12/08/16 0202 12/08/16 1419 12/09/16 0425  NA 138  --  135  K 4.2 3.0* 4.2  CL 101  --  101  CO2 28  --  24  GLUCOSE 282*  --  170*  BUN 33*  --  30*  CREATININE 1.35*  --  1.36*  CALCIUM 9.2  --  8.7*  MG  --  2.1  --     Recent Labs  12/07/16 1836  AST 31  ALT 17  ALKPHOS 69  BILITOT 0.8  PROT 8.0  ALBUMIN 3.6    Recent Labs  12/07/16 0618 12/07/16 1836 12/08/16 0202 12/09/16 0425  WBC 9.8 9.1 10.2 10.2  NEUTROABS 6.6* 6.2  --   --   HGB 10.5* 11.0* 10.2* 10.3*  HCT 31.0* 34.2* 30.7* 30.7*  MCV 86.5 86.8 84.9 86.6  PLT 324 345 347 381    Recent Labs  12/07/16 2252 12/08/16 0202 12/08/16 1016 12/08/16 1419  TROPONINI 6.51* 11.28* 13.67* 12.22*   Invalid input(s): POCBNP No results for  input(s): HGBA1C in the last 72 hours.   Weights: Filed Weights   12/07/16 1824 12/07/16 2300 12/08/16 1429  Weight: 129.3 kg (285 lb) 129 kg (284 lb 6.3 oz) 127 kg (279 lb 15.8 oz)     Radiology/Studies:  Dg Chest 2 View  Result Date: 12/07/2016 CLINICAL DATA:  Shortness of breath.  Chest pressure.  Dyspnea. EXAM: CHEST  2 VIEW COMPARISON:  Radiographs and CT 1 week prior 11/30/2016 FINDINGS: Unchanged heart size and mediastinal contours. Worsening pulmonary edema from prior exam. No confluent airspace disease. No pleural fluid. No pneumothorax. No acute osseous abnormality. IMPRESSION: Worsening pulmonary edema from prior exam. Electronically Signed   By: Jeb Levering M.D.   On: 12/07/2016 06:53   Dg Chest 2 View  Result Date: 11/28/2016 CLINICAL DATA:  Chest pain, shortness of breath. EXAM: CHEST  2 VIEW COMPARISON:  Radiographs of November 27, 2016. FINDINGS: The heart size and mediastinal contours are within normal limits. Both lungs are clear. No pneumothorax or pleural effusion is noted. The visualized skeletal structures are unremarkable. IMPRESSION: No active cardiopulmonary disease. Pulmonary edema noted on prior exam has resolved. Electronically Signed   By: Marijo Conception, M.D.   On: 11/28/2016 08:06   Dg Chest 2 View  Result Date: 11/27/2016 CLINICAL DATA:  Chest pain EXAM: CHEST  2 VIEW COMPARISON:  11/03/2016 FINDINGS: Borderline cardiomegaly. There is generalized Kerley lines and fissure thickening. No effusion or pneumothorax. Negative aorta and hila. IMPRESSION: Pulmonary interstitial edema. Electronically Signed   By: Monte Fantasia M.D.   On: 11/27/2016 08:26   Ct Angio Chest Pe W And/or Wo Contrast  Result Date: 11/30/2016 CLINICAL DATA:  Acute onset of shortness of breath and generalized chest pressure. Initial encounter. EXAM: CT ANGIOGRAPHY CHEST WITH CONTRAST TECHNIQUE: Multidetector CT imaging of the chest was performed using the standard protocol during bolus  administration of intravenous contrast. Multiplanar CT image reconstructions and MIPs were obtained to evaluate the vascular anatomy. CONTRAST:  75 mL of Isovue 370 IV contrast COMPARISON:  Chest radiograph performed earlier today at 4:27 a.m., and CTA of the chest performed 10/10/2014 FINDINGS: Cardiovascular: There is no evidence of significant pulmonary embolus. Evaluation for pulmonary embolus is somewhat suboptimal due to motion artifact. The heart remains normal in size. Diffuse coronary artery calcifications are seen. Scattered calcification is seen along the aortic arch. The proximal great vessels are grossly unremarkable in appearance. Mediastinum/Nodes: Mildly enlarged right paratracheal and superior mediastinal nodes measure up to 1.2 cm in short axis. No pericardial effusion is identified. The thyroid gland is unremarkable. No axillary lymphadenopathy is seen. Lungs/Pleura: Diffuse interstitial prominence is noted, with hazy bilateral airspace opacities, compatible with pulmonary edema. No pleural effusion or pneumothorax is seen. No  dominant mass is identified. Upper Abdomen: The visualized portions of the liver and spleen are unremarkable, aside from an apparent calcified granuloma within the spleen. The visualized portions of the gallbladder, pancreas, adrenal glands and stomach are within normal limits. Musculoskeletal: No acute osseous abnormalities are identified. The visualized musculature is unremarkable in appearance. Review of the MIP images confirms the above findings. IMPRESSION: 1. No evidence of significant pulmonary embolus. 2. Diffuse interstitial prominence and hazy bilateral airspace opacities are compatible with pulmonary edema. 3. Diffuse coronary artery calcifications seen. 4. Mildly enlarged mediastinal nodes measure up to 1.2 cm in short axis. Electronically Signed   By: Garald Balding M.D.   On: 11/30/2016 06:19   Dg Chest Port 1 View  Result Date: 12/08/2016 CLINICAL DATA:   Status post cardiac arrest EXAM: PORTABLE CHEST 1 VIEW COMPARISON:  12/07/2016 FINDINGS: There is mild bilateral interstitial prominence. There is no focal parenchymal opacity. There is no pleural effusion or pneumothorax. There is stable cardiomegaly. The osseous structures are unremarkable. IMPRESSION: Cardiomegaly with mild pulmonary vascular congestion. Electronically Signed   By: Kathreen Devoid   On: 12/08/2016 14:36   Dg Chest Port 1 View  Result Date: 12/07/2016 CLINICAL DATA:  Patient with sudden onset centralized chest pain. Shortness of breath. EXAM: PORTABLE CHEST 1 VIEW COMPARISON:  Chest radiograph 12/07/2016. FINDINGS: Pacer apparatus overlies the left hemithorax. Stable cardiomegaly. Bilateral perihilar interstitial pulmonary opacities. No pleural effusion or pneumothorax. IMPRESSION: Cardiomegaly with bilateral interstitial opacities favored to represent pulmonary edema, similar to prior. Electronically Signed   By: Lovey Newcomer M.D.   On: 12/07/2016 18:48   Dg Chest Port 1 View  Result Date: 11/30/2016 CLINICAL DATA:  Acute onset of shortness of breath and generalized chest pressure. Initial encounter. EXAM: PORTABLE CHEST 1 VIEW COMPARISON:  Chest radiograph performed 11/28/2016 FINDINGS: The lungs are well-aerated. Vascular congestion is noted. Increased interstitial markings raise concern for mild interstitial edema. There is no evidence of pleural effusion or pneumothorax. The cardiomediastinal silhouette is borderline normal in size. No acute osseous abnormalities are seen. IMPRESSION: Vascular congestion. Increased interstitial markings raise concern for mild interstitial edema. Electronically Signed   By: Garald Balding M.D.   On: 11/30/2016 05:00     Assessment and Recommendation  66 y.o. male with known coronary artery disease status post multiple previous stents having recurrent episodes of the combined systolic diastolic dysfunction heart failure and new onset atrial fibrillation  with rapid ventricular rate causing worsening heart failure ventricular fibrillation and ventricular tachycardia status post ACLS protocol to call in normal rhythm and recovering with a non-ST elevation myocardial infarction 1. Continue amiodarone drip and convert to 400 mg amiodarone twice a day 2. Continue anticoagulation including heparin for 24 hours more after myocardial infarction and further conversion to oral anticoagulation thereafter for further risk reduction in stroke with atrial fibrillation 3. Possible use of beta blocker if patient can tolerate with blood pressure 4. Repeat echocardiogram for LV systolic dysfunction valvular heart disease since non-ST elevation myocardial infarction for further assessment and treatment of above 5. Begin ambulation and follow for worsening symptoms and causes of above  Signed, Serafina Royals M.D. FACC

## 2016-12-09 NOTE — Progress Notes (Signed)
[facility] Progess Note    SYNOPSIS   Patient with readmission after V. Fib/V. Tach arrest on the floor. Required CPR and defibrillation  ASSESSMENT/PLAN   NSTEMI. Peak troponin of 12.22. No further episode of arrhythmia last night presently on amiodarone infusion along with anticoagulation. We'll continue to monitor in the Intensive care unit, appreciate cardiology input  Hyperglycemia. We'll monitor closely  Renal insufficiency. Stable, creatinine is 1.36 with a BU in of 30   Intake/Output Summary (Last 24 hours) at 12/09/16 0813 Last data filed at 12/09/16 0600  Gross per 24 hour  Intake          1531.79 ml  Output             1600 ml  Net           -68.21 ml   MRN: [id number] DOB: 1950-06-29     SUBJECTIVE:   Patient doing well this morning. Yesterday unfortunately suffered a ventricular fibrillation/ventricul tachycardia resulting in CPR and  Defibrillation. Was seen by cardiology yesterday felt to be best medically managed.Has been left on amiodarone overnight, converted to sinus rhythm and blood pressure has improved with fluid resuscitation. Presently he is awake alert in no acute distress. Complains of chest pain today post fibrillation  VITAL SIGNS: Temp:  [97.4 F (36.3 C)-98.1 F (36.7 C)] 97.9 F (36.6 C) (08/25 0700) Pulse Rate:  [54-66] 60 (08/25 0700) Resp:  [10-28] 20 (08/25 0700) BP: (51-136)/(24-71) 136/69 (08/25 0700) SpO2:  [87 %-100 %] 94 % (08/25 0700) FiO2 (%):  [35 %-45 %] 45 % (08/24 1610) Weight:  [127 kg (279 lb 15.8 oz)] 127 kg (279 lb 15.8 oz) (08/24 1429)   PHYSICAL EXAMINATION: Physical Examination:   VS: BP 136/69   Pulse 60   Temp 97.9 F (36.6 C)   Resp 20   Ht 6\' 2"  (1.88 m)   Wt 127 kg (279 lb 15.8 oz)   SpO2 94%   BMI 35.95 kg/m   General Appearance: No distress  Neuro:without focal findings, mental status normal. HEENT: PERRLA, EOM intact. Pulmonary: normal breath sounds     CardiovascularNormal S1,S2.  No m/r/g.   Skin:   warm, no rashes, no ecchymosis  Extremities: normal, no cyanosis, clubbing.    LABORATORY PANEL:   CBC  Recent Labs Lab 12/09/16 0425  WBC 10.2  HGB 10.3*  HCT 30.7*  PLT 381    Chemistries   Recent Labs Lab 12/07/16 1836  12/08/16 1419 12/09/16 0425  NA 135  < >  --  135  K 4.5  < > 3.0* 4.2  CL 97*  < >  --  101  CO2 24  < >  --  24  GLUCOSE 494*  < >  --  170*  BUN 32*  < >  --  30*  CREATININE 1.63*  < >  --  1.36*  CALCIUM 9.6  < >  --  8.7*  MG  --   --  2.1  --   AST 31  --   --   --   ALT 17  --   --   --   ALKPHOS 69  --   --   --   BILITOT 0.8  --   --   --   < > = values in this interval not displayed.   Recent Labs Lab 12/08/16 1445 12/08/16 1653 12/08/16 2001 12/09/16 0011 12/09/16 0357 12/09/16 0751  GLUCAP 311* 278* 228*  206* 164* 152*   No results for input(s): PHART, PCO2ART, PO2ART in the last 168 hours.  Recent Labs Lab 12/07/16 1836  AST 31  ALT 17  ALKPHOS 69  BILITOT 0.8  ALBUMIN 3.6    Cardiac Enzymes  Recent Labs Lab 12/08/16 1419  TROPONINI 12.22*    RADIOLOGY:  Dg Chest Port 1 View  Result Date: 12/08/2016 CLINICAL DATA:  Status post cardiac arrest EXAM: PORTABLE CHEST 1 VIEW COMPARISON:  12/07/2016 FINDINGS: There is mild bilateral interstitial prominence. There is no focal parenchymal opacity. There is no pleural effusion or pneumothorax. There is stable cardiomegaly. The osseous structures are unremarkable. IMPRESSION: Cardiomegaly with mild pulmonary vascular congestion. Electronically Signed   By: Kathreen Devoid   On: 12/08/2016 14:36   Dg Chest Port 1 View  Result Date: 12/07/2016 CLINICAL DATA:  Patient with sudden onset centralized chest pain. Shortness of breath. EXAM: PORTABLE CHEST 1 VIEW COMPARISON:  Chest radiograph 12/07/2016. FINDINGS: Pacer apparatus overlies the left hemithorax. Stable cardiomegaly. Bilateral perihilar interstitial pulmonary  opacities. No pleural effusion or pneumothorax. IMPRESSION: Cardiomegaly with bilateral interstitial opacities favored to represent pulmonary edema, similar to prior. Electronically Signed   By: Lovey Newcomer M.D.   On: 12/07/2016 18:48    Hermelinda Dellen, DO 12/09/2016

## 2016-12-09 NOTE — Progress Notes (Signed)
[place] received PG to report to ICU 11. [place] arrived at room to find PT a wake and alert. PT wanted to talk about what was happening in his current heal   12/09/16 0930  Clinical Encounter Type  Visited With Patient  Visit Type Initial  Referral From Nurse  Consult/Referral To Chaplain  Spiritual Encounters  Spiritual Needs Prayer;Emotional  th condition. [place] prayed for PT.

## 2016-12-09 NOTE — Progress Notes (Signed)
Weaned patient to nasal cannula earlier when he was sitting up and fully awake. Now back on HFNC due to desaturation while sleeping. Tolerating well

## 2016-12-09 NOTE — Progress Notes (Addendum)
Spoke with NP about changing sliding scale to more moderate scale since last CBG was elevated. Will change and continue to monitor.

## 2016-12-09 NOTE — Progress Notes (Addendum)
[facility] [place] NAME: Dennis Mullen    MR#:  [id number]  DATE OF BIRTH:  Feb 20, 1951  SUBJECTIVE:  CHIEF COMPLAINT:   Chief Complaint  Patient presents with  . Code STEMI   Patient had cardiac arrest with ventricular fibrillation on 12/08/2016. At 5 minutes of CPR. Today he is awake and responding. Feels weak. Pleuritic chest pain. On high flow nasal cannula. amiodarone and heparin drip.  REVIEW OF SYSTEMS:    Review of Systems  Constitutional: Positive for malaise/fatigue. Negative for chills and fever.  HENT: Negative for sore throat.   Eyes: Negative for blurred vision, double vision and pain.  Respiratory: Positive for cough and shortness of breath. Negative for hemoptysis and wheezing.   Cardiovascular: Positive for chest pain. Negative for palpitations, orthopnea and leg swelling.  Gastrointestinal: Negative for abdominal pain, constipation, diarrhea, heartburn, nausea and vomiting.  Genitourinary: Negative for dysuria and hematuria.  Musculoskeletal: Negative for back pain and joint pain.  Skin: Negative for rash.  Neurological: Positive for weakness. Negative for sensory change, speech change, focal weakness and headaches.  Endo/Heme/Allergies: Does not bruise/bleed easily.  Psychiatric/Behavioral: Negative for depression. The patient is not nervous/anxious.     DRUG ALLERGIES:  No Known Allergies  VITALS:  Blood pressure 136/69, pulse 60, temperature 97.9 F (36.6 C), resp. rate 20, height 6\' 2"  (1.88 m), weight 127 kg (279 lb 15.8 oz), SpO2 94 %.  PHYSICAL EXAMINATION:   Physical Exam  GENERAL:  66 y.o.-year-old patient lying in the bed   EYES: Pupils equal, round, reactive to light and accommodation. No scleral icterus. Extraocular muscles intact.  HEENT: Head atraumatic, normocephalic. Oropharynx and nasopharynx clear.  NECK:  Supple, no jugular venous distention. No thyroid enlargement, no tenderness.  LUNGS:  Normal breath sounds bilaterally,Decreased air entry CARDIOVASCULAR: S1, S2 normal. No murmurs, rubs, or gallops.  ABDOMEN: Soft, nontender, nondistended. Bowel sounds present. No organomegaly or mass.  EXTREMITIES: No cyanosis, clubbing or edema b/l.    NEUROLOGIC: Cranial nerves II through XII are intact. No focal Motor or sensory deficits b/l.   PSYCHIATRIC: The patient is alert and oriented x 3.  SKIN: No obvious rash, lesion, or ulcer.   LABORATORY PANEL:   CBC  Recent Labs Lab 12/09/16 0425  WBC 10.2  HGB 10.3*  HCT 30.7*  PLT 381   ------------------------------------------------------------------------------------------------------------------ Chemistries   Recent Labs Lab 12/07/16 1836  12/08/16 1419 12/09/16 0425  NA 135  < >  --  135  K 4.5  < > 3.0* 4.2  CL 97*  < >  --  101  CO2 24  < >  --  24  GLUCOSE 494*  < >  --  170*  BUN 32*  < >  --  30*  CREATININE 1.63*  < >  --  1.36*  CALCIUM 9.6  < >  --  8.7*  MG  --   --  2.1  --   AST 31  --   --   --   ALT 17  --   --   --   ALKPHOS 69  --   --   --   BILITOT 0.8  --   --   --   < > = values in this interval not displayed. ------------------------------------------------------------------------------------------------------------------  Cardiac Enzymes  Recent Labs Lab 12/08/16 1419  TROPONINI 12.22*   ------------------------------------------------------------------------------------------------------------------  RADIOLOGY:  Dg Chest Port 1 View  Result Date: 12/08/2016 CLINICAL DATA:  Status post cardiac arrest EXAM: PORTABLE CHEST 1 VIEW COMPARISON:  12/07/2016 FINDINGS: There is mild bilateral interstitial prominence. There is no focal parenchymal opacity. There is no pleural effusion or pneumothorax. There is stable cardiomegaly. The osseous structures are unremarkable. IMPRESSION: Cardiomegaly with mild pulmonary vascular congestion. Electronically Signed   By: Kathreen Devoid   On: 12/08/2016  14:36   Dg Chest Port 1 View  Result Date: 12/07/2016 CLINICAL DATA:  Patient with sudden onset centralized chest pain. Shortness of breath. EXAM: PORTABLE CHEST 1 VIEW COMPARISON:  Chest radiograph 12/07/2016. FINDINGS: Pacer apparatus overlies the left hemithorax. Stable cardiomegaly. Bilateral perihilar interstitial pulmonary opacities. No pleural effusion or pneumothorax. IMPRESSION: Cardiomegaly with bilateral interstitial opacities favored to represent pulmonary edema, similar to prior. Electronically Signed   By: Lovey Newcomer M.D.   On: 12/07/2016 18:48    ASSESSMENT AND PLAN:   * Atrial fibrillation with rapid ventricular rate. Patient continues on amiodarone drip and improved. On heparin drip.  * Non-ST elevation MI  Patient had recent cardiac catheterization with moderate CAD in multiple vessels. Medical management advised. This was unchanged from his catheterization in 02/ 2018. Seen by Dr. Clayborn Bigness in the emergency room  due to concern for ST elevation MI. Discussed with Dr. [name] Troponin peaked at 12.2 On Plavix, statin, beta blocker. Heparin drip  Check echocardiogram for any wall motion abnormalities. If any significant wall motion abnormalities he will need a repeat cardiac catheterization.  * ventricular fibrillation with cardiac arrest.Status post resuscitation. On amiodarone drip.  * trial fibrillation with rapid ventricular rate. On amiodarone drip. Improved. Change to by mouth.  * Acute on chronic diastolic congestive heart failure. On Lasix.   * Acute hypoxic respiratory failure due to CHF. Wean oxygen as tolerated. Nebulizers when necessary. On high flow nasal cannula  * Diabetes mellitus type 2. Sliding scale insulin. Consult diabetes coordinator patient is on insulin pump at home  * CKD stage III. Monitor while on Lasix.  All the records are reviewed and case discussed with Care Management/Social Worker Management plans discussed with the patient,  family and they are in agreement.  CODE STATUS: FULL CODE  DVT Prophylaxis: SCDs  TOTAL TIME TAKING CARE OF THIS PATIENT: 35 minutes.   POSSIBLE D/C IN 2-3 DAYS, DEPENDING ON CLINICAL CONDITION.  Hillary Bow R M.D on 12/09/2016 at 1:27 PM  Between 7am to 6pm - Pager - [phone]  After 6pm go to [email or web address] - password [email or web address] [facility]  Office  [phone]  CC: Primary care physician; Lavera Guise, MD  Note: This dictation was prepared with Dragon dictation along with smaller phrase technology. Any transcriptional errors that result from this process are unintentional.

## 2016-12-10 ENCOUNTER — Inpatient Hospital Stay
Admit: 2016-12-10 | Discharge: 2016-12-10 | Disposition: A | Discharge status: Home / Self Care | Payer: Medicare Other | Attending: Internal Medicine | Admitting: Internal Medicine

## 2016-12-10 ENCOUNTER — Inpatient Hospital Stay: Payer: Medicare Other

## 2016-12-10 LAB — GLUCOSE, CAPILLARY
GLUCOSE-CAPILLARY: 207 mg/dL — AB (ref 65–99)
GLUCOSE-CAPILLARY: 293 mg/dL — AB (ref 65–99)
Glucose-Capillary: 154 mg/dL — ABNORMAL HIGH (ref 65–99)
Glucose-Capillary: 256 mg/dL — ABNORMAL HIGH (ref 65–99)
Glucose-Capillary: 257 mg/dL — ABNORMAL HIGH (ref 65–99)

## 2016-12-10 LAB — CBC
HCT: 30.4 % — ABNORMAL LOW (ref 40.0–52.0)
Hemoglobin: 10.1 g/dL — ABNORMAL LOW (ref 13.0–18.0)
MCH: 28.7 pg (ref 26.0–34.0)
MCHC: 33.3 g/dL (ref 32.0–36.0)
MCV: 86.4 fL (ref 80.0–100.0)
Platelets: 344 K/uL (ref 150–440)
RBC: 3.52 MIL/uL — ABNORMAL LOW (ref 4.40–5.90)
RDW: 17.9 % — ABNORMAL HIGH (ref 11.5–14.5)
WBC: 8.1 K/uL (ref 3.8–10.6)

## 2016-12-10 LAB — BASIC METABOLIC PANEL
ANION GAP: 10 (ref 5–15)
BUN: 27 mg/dL — ABNORMAL HIGH (ref 6–20)
CALCIUM: 9.1 mg/dL (ref 8.9–10.3)
CO2: 25 mmol/L (ref 22–32)
Chloride: 99 mmol/L — ABNORMAL LOW (ref 101–111)
Creatinine, Ser: 1.62 mg/dL — ABNORMAL HIGH (ref 0.61–1.24)
GFR calc Af Amer: 50 mL/min — ABNORMAL LOW (ref >60)
GFR, EST NON AFRICAN AMERICAN: 43 mL/min — AB (ref >60)
Glucose, Bld: 243 mg/dL — ABNORMAL HIGH (ref 65–99)
POTASSIUM: 4.4 mmol/L (ref 3.5–5.1)
SODIUM: 134 mmol/L — AB (ref 135–145)

## 2016-12-10 LAB — HEPARIN LEVEL (UNFRACTIONATED)
HEPARIN UNFRACTIONATED: 0.37 [IU]/mL (ref 0.30–0.70)
Heparin Unfractionated: 0.29 [IU]/mL — ABNORMAL LOW (ref 0.30–0.70)
Heparin Unfractionated: 0.33 IU/mL (ref 0.30–0.70)

## 2016-12-10 LAB — MAGNESIUM: MAGNESIUM: 2 mg/dL (ref 1.7–2.4)

## 2016-12-10 MED ORDER — HEPARIN BOLUS VIA INFUSION
1700.0000 [IU] | Freq: Once | INTRAVENOUS | Status: AC
  Administered 2016-12-10: 1700 [IU] via INTRAVENOUS
  Filled 2016-12-10: qty 1700

## 2016-12-10 NOTE — Progress Notes (Signed)
eLink Physician-Brief Progress Note Patient Name: Dennis Mullen DOB: [birth year] MRN: [id number]   Date of Service  12/10/2016  HPI/Events of Note  Nurse reports that the patient has gone into AFIB with ventricular rate = 80's to 90's. BP = 107/56. The patient is already on Amiodarone PO for chronic AFIB.  eICU Interventions  Will order:  1. BMP and Magnesium level now.      Intervention Category Major Interventions: Arrhythmia - evaluation and management  Boleslaw Borghi [place] 12/10/2016, 6:20 PM

## 2016-12-10 NOTE — Progress Notes (Signed)
Patient was able to sleep most of the night, but wakes up with chest soreness from chest compressions on Friday. Declined pain medication. Has required HFNC while asleep. [place]ats on [place]. Was able to stand at bedside and tolerated. Cardiologist saw pt this AM. Continue to monitor.

## 2016-12-10 NOTE — Progress Notes (Signed)
   12/10/16 0919  Vitals  ECG Heart Rate (!) 116  Cardiac Rhythm ST  ECG Intervals  PR interval 0.18  QRS interval 0.1  QT interval 0.38  QTc interval 0.39

## 2016-12-10 NOTE — Progress Notes (Addendum)
[facility] Progess Note    SYNOPSIS   Patient with readmission after V. Fib/V. Tach arrest on the floor. Required CPR and defibrillation  ASSESSMENT/PLAN   NSTEMI. Peak troponin of 12.22.  Unfortunately patient converted to atrial fibrillation this morning given morning dose of amiodarone. May need to be converted back to infusion will monitor closely  Hypoxemia. Will obtain chest x-ray this morning, most likely a component of VQ mismatch Pain is being managed with morphine as needed. We'll provide and wean FiO2 as tolerated  Hyperglycemia. On coverage  Renal insufficiency. Ill check BMP in the morning  Critical care time 40 minutes, arrhythmia, hypoxemic respiratory failure  Intake/Output Summary (Last 24 hours) at 12/10/16 0849 Last data filed at 12/10/16 0500  Gross per 24 hour  Intake            310.5 ml  Output             1100 ml  Net           -789.5 ml   MRN: [id number] DOB: 1950/07/15     SUBJECTIVE:   Patient doing well this morning. Yesterday unfortunately suffered a ventricular fibrillation/ventricul tachycardia resulting in CPR and  Defibrillation. Was seen by cardiology yesterday felt to be best medically managed.Has been left on amiodarone overnight, converted to sinus rhythm and blood pressure has improved with fluid resuscitation. Presently he is awake alert in no acute distress. Complains of chest pain today post fibrillation  VITAL SIGNS: Temp:  [97.6 F (36.4 C)-97.9 F (36.6 C)] 97.6 F (36.4 C) (08/26 0200) Pulse Rate:  [60-69] 63 (08/26 0500) Resp:  [17-29] 18 (08/26 0500) BP: (98-148)/(34-65) 133/63 (08/26 0400) SpO2:  [86 %-100 %] 98 % (08/26 0500) FiO2 (%):  [60 %-80 %] 60 % (08/26 0809)   PHYSICAL EXAMINATION: Physical Examination:   VS: BP 133/63   Pulse 63   Temp 97.6 F (36.4 C) (Oral)   Resp 18   Ht 6\' 2"  (1.88 m)   Wt 127 kg (279 lb 15.8 oz)   SpO2 98%   BMI 35.95 kg/m   General Appearance: No  distress  Neuro:without focal findings, mental status normal. HEENT: PERRLA, EOM intact. Pulmonary: normal breath sounds   CardiovascularNormal S1,S2.  No m/r/g.   Skin:   warm, no rashes, no ecchymosis  Extremities: normal, no cyanosis, clubbing.    LABORATORY PANEL:   CBC  Recent Labs Lab 12/10/16 0509  WBC 8.1  HGB 10.1*  HCT 30.4*  PLT 344    Chemistries   Recent Labs Lab 12/07/16 1836  12/08/16 1419 12/09/16 0425  NA 135  < >  --  135  K 4.5  < > 3.0* 4.2  CL 97*  < >  --  101  CO2 24  < >  --  24  GLUCOSE 494*  < >  --  170*  BUN 32*  < >  --  30*  CREATININE 1.63*  < >  --  1.36*  CALCIUM 9.6  < >  --  8.7*  MG  --   --  2.1  --   AST 31  --   --   --   ALT 17  --   --   --   ALKPHOS 69  --   --   --   BILITOT 0.8  --   --   --   < > = values in this interval not displayed.   Recent  Labs Lab 12/09/16 1145 12/09/16 1608 12/09/16 1931 12/09/16 2139 12/10/16 0050 12/10/16 0749  GLUCAP 200* 238* 301* 251* 207* 154*   No results for input(s): PHART, PCO2ART, PO2ART in the last 168 hours.  Recent Labs Lab 12/07/16 1836  AST 31  ALT 17  ALKPHOS 69  BILITOT 0.8  ALBUMIN 3.6    Cardiac Enzymes  Recent Labs Lab 12/08/16 1419  TROPONINI 12.22*    RADIOLOGY:  Dg Chest Port 1 View  Result Date: 12/08/2016 CLINICAL DATA:  Status post cardiac arrest EXAM: PORTABLE CHEST 1 VIEW COMPARISON:  12/07/2016 FINDINGS: There is mild bilateral interstitial prominence. There is no focal parenchymal opacity. There is no pleural effusion or pneumothorax. There is stable cardiomegaly. The osseous structures are unremarkable. IMPRESSION: Cardiomegaly with mild pulmonary vascular congestion. Electronically Signed   By: Kathreen Devoid   On: 12/08/2016 14:36    Hermelinda Dellen, DO 12/10/2016

## 2016-12-10 NOTE — Progress Notes (Signed)
ANTICOAGULATION CONSULT NOTE - Consult  Pharmacy Consult for Heparin Indication: chest pain/ACS  No Known Allergies  Patient Measurements: Height: '6\' 2"'$  (188 cm) Weight: 279 lb 15.8 oz (127 kg) IBW/kg (Calculated) : 82.2 Heparin Dosing Weight: 110  Vital Signs: Temp: 97.8 F (36.6 C) (08/26 0800) Temp Source: Oral (08/26 0800) BP: 109/61 (08/26 1116) Pulse Rate: 68 (08/26 1116)  Labs:  Recent Labs  12/07/16 1836  12/07/16 1912  12/08/16 0202 12/08/16 1016 12/08/16 1419  12/09/16 0425 12/10/16 0509 12/10/16 1306  HGB 11.0*  --   --   --  10.2*  --   --   --  10.3* 10.1*  --   HCT 34.2*  --   --   --  30.7*  --   --   --  30.7* 30.4*  --   PLT 345  --   --   --  347  --   --   --  381 344  --   APTT  --   --  69*  --   --   --   --   --   --   --   --   LABPROT  --   --  15.3*  --   --   --   --   --   --   --   --   INR  --   --  1.20  --   --   --   --   --   --   --   --   HEPARINUNFRC  --   < > 1.04*  --  0.76* 0.55  --   < > 0.38 0.29* 0.33  CREATININE 1.63*  --   --   --  1.35*  --   --   --  1.36*  --   --   TROPONINI 1.20*  --   --   < > 11.28* 13.67* 12.22*  --   --   --   --   < > = values in this interval not displayed.  Estimated Creatinine Clearance: 77.7 mL/min (A) (by C-G formula based on SCr of 1.36 mg/dL (H)).   Medical History: Past Medical History:  Diagnosis Date  . Abnormal levels of other serum enzymes   . Anxiety   . Bipolar disorder ([place])   . BPH (benign prostatic hyperplasia)   . CAD (coronary artery disease)   . CHF (congestive heart failure) ([place])    ?  Marland Kitchen Depression   . Diabetes mellitus, type II ([place])   . Diabetes mellitus, type II, insulin dependent ([place])   . ED (erectile dysfunction)   . GERD (gastroesophageal reflux disease)   . Gout   . History of borderline personality disorder   . Hypertension   . Hypogonadism in male   . Hypothyroidism   . Insomnia   . Mood disorder ([place])   . Neuropathy   . Obesity   . OSA  (obstructive sleep apnea)   . PVD (peripheral vascular disease) ([place])   . Thyroid disease     Medications:  Prescriptions Prior to Admission  Medication Sig Dispense Refill Last Dose  . allopurinol (ZYLOPRIM) 100 MG tablet Take 100 mg by mouth daily.    12/06/2016 at 0800  . amLODipine (NORVASC) 5 MG tablet Take 5 mg by mouth at bedtime.    12/06/2016 at 2000  . apixaban (ELIQUIS) 5 MG TABS tablet Take 1 tablet (5 mg total) by mouth  2 (two) times daily. 60 tablet 0 12/06/2016 at 1800  . clopidogrel (PLAVIX) 75 MG tablet Take 75 mg by mouth daily. Reported on 07/26/2015   12/06/2016 at 0800  . colchicine 0.6 MG tablet Take 0.6 mg by mouth 2 (two) times daily as needed (gout flare).    PRN at PRN  . Continuous Blood Gluc Sensor (FREESTYLE LIBRE SENSOR SYSTEM) MISC Use 1 each as directed. Please place prior to office visit as directed   Taking  . diclofenac (VOLTAREN) 75 MG EC tablet Take 75 mg by mouth daily as needed (gout flare).    PRN at PRN  . divalproex (DEPAKOTE ER) 500 MG 24 hr tablet Take 1-2 tablets (500-1,000 mg total) by mouth 2 (two) times daily. Take '500mg'$  in morning and '1000mg'$  in evening 270 tablet 1 12/06/2016 at 2000  . DULoxetine (CYMBALTA) 60 MG capsule Take 1 capsule (60 mg total) by mouth daily. 90 capsule 1 12/06/2016 at 0800  . esomeprazole (NEXIUM) 40 MG capsule Take 40 mg by mouth daily.    12/06/2016 at 0800  . fenofibrate (TRICOR) 145 MG tablet Take 145 mg by mouth daily.    12/06/2016 at 0800  . furosemide (LASIX) 40 MG tablet Take 1 tablet (40 mg total) by mouth daily. 30 tablet 0 12/06/2016 at 0800  . gemfibrozil (LOPID) 600 MG tablet gemfibrozil 600 mg tablet   12/06/2016 at 0800  . glucose 4 GM chewable tablet Chew 4 g by mouth as needed for low blood sugar.    PRN at PRN  . Insulin Disposable Pump (V-GO 30) KIT Inject 1.2 Units/hr into the skin every hour. 66 units/24 hrs (36 available bolus units--12 bolus units available per meals)   12/07/2016 at Unknown time  . insulin  lispro (HUMALOG KWIKPEN) 100 UNIT/ML KiwkPen Inject into the skin See admin instructions. Used ONLY as needed if additional bolus units are needed (up to 36 units/24 hrs.)   PRN at PRN  . isosorbide mononitrate (IMDUR) 60 MG 24 hr tablet Take 60 mg by mouth daily.    12/06/2016 at 0800  . JUBLIA 10 % SOLN Apply 1 application topically daily.   12/06/2016 at Unknown time  . levothyroxine (SYNTHROID, LEVOTHROID) 50 MCG tablet Take 50 mcg by mouth daily before breakfast.   12/06/2016 at 0800  . metFORMIN (GLUCOPHAGE) 1000 MG tablet Take 1,000 mg by mouth 2 (two) times daily with a meal. Morning & supper   12/06/2016 at 2000  . metoprolol succinate (TOPROL-XL) 50 MG 24 hr tablet Take 50 mg by mouth daily.    12/06/2016 at 0800  . nitroGLYCERIN (NITROSTAT) 0.4 MG SL tablet Place 0.4 mg under the tongue every 5 (five) minutes x 3 doses as needed for chest pain.    PRN at PRN  . pregabalin (LYRICA) 100 MG capsule Take 100 mg by mouth 3 (three) times daily before meals.    12/06/2016 at 2000  . rOPINIRole (REQUIP) 2 MG tablet Take 2 mg by mouth 2 (two) times daily.    12/06/2016 at 2000  . rosuvastatin (CRESTOR) 20 MG tablet Take 20 mg by mouth at bedtime.    12/06/2016 at 2000  . VICTOZA 18 MG/3ML SOPN Inject 1.8 mg into the skin daily.    12/06/2016 at PRN  . vitamin C (ASCORBIC ACID) 500 MG tablet Take 500 mg by mouth daily.   12/06/2016 at 0800   Scheduled:  . allopurinol  100 mg Oral Daily  . amiodarone  400 mg Oral  BID  . clopidogrel  75 mg Oral Daily  . divalproex  1,000 mg Oral QHS  . divalproex  500 mg Oral Daily  . DULoxetine  60 mg Oral Daily  . fenofibrate  54 mg Oral Daily  . furosemide  40 mg Oral Daily  . insulin aspart  0-15 Units Subcutaneous TID WC  . insulin aspart  0-5 Units Subcutaneous QHS  . isosorbide mononitrate  60 mg Oral Daily  . levothyroxine  50 mcg Oral QAC breakfast  . metoprolol succinate  50 mg Oral Daily  . pantoprazole  40 mg Oral Daily  . pregabalin  100 mg Oral TID AC   . rOPINIRole  2 mg Oral BID  . rosuvastatin  20 mg Oral QHS  . sodium chloride flush  3 mL Intravenous Q12H  . vitamin C  500 mg Oral Daily   Infusions:  . sodium chloride Stopped (12/07/16 2127)  . sodium chloride    . heparin 1,550 Units/hr (12/10/16 0449)    Assessment: Pharmacy consulted to dose and monitor heparin in this 66 year old male being treated for ACS. Patient takes apixaban as an outpatient. Last dose of apixaban was 1800 on 8/22.  Goal of Therapy:  Heparin level 0.3-0.7 units/ml Monitor platelets by anticoagulation protocol: Yes   Plan:  8/24 1600 heparin level 0.44. Will continue heparin at current rate of 1350 units/hr. Recheck HL tomorrow AM.   8/24 0200 heparin level 0.76. Decrease rate to 1350 units/hr. Recheck in 6 hours. 8/24 1000 heparin level 0.55. Will continue heparin at current rate of 1350 units/hr. Recheck HL in 6 hours.   [facility] Resident  12/10/2016,1:43 PM    8/25 AM heparin level 0.38. Continue current regimen. Recheck heparin level and CBC with tomorrow AM labs.  8/26 AM heparin level 0.29. 1700 units bolus and increase rate to 1550 units/hr. 8/26: Heparin level resulted _0 .33. Will continue current heparin gtt rate. Will recheck Heparin level @ [place], PharmD  1:43 PM

## 2016-12-10 NOTE — Progress Notes (Signed)
[facility] Encounter Note  Patient: DEANDREW HOECKER / Admit Date: 12/07/2016 / Date of Encounter: 12/10/2016, 7:16 AM   Subjective: Patient has significant amount of chest soreness from ACLS protocol and chest compressions but slowly improving today. Patient has continued EKG changes with diffuse ST depression and T-wave inversions slightly different from previous admission. Cardiac catheterization from previous admission evaluated showing patent stents and no evidence of progression of coronary disease requiring further intervention and ejection fraction 55%. Currently the patient remains in normal sinus rhythm on amiodarone after of V. tach V. fib  Patient does have elevated troponin consistent with non-ST elevation myocardial infarction although it is unclear whether this is due to coronary artery occlusion and/or acute coronary syndrome or demand ischemia at this time  Review of Systems: Positive for: Chest pain Negative for: Vision change, hearing change, syncope, dizziness, nausea, vomiting,diarrhea, bloody stool, stomach pain, cough, congestion, diaphoresis, urinary frequency, urinary pain,skin lesions, skin rashes Others previously listed  Objective: Telemetry: Normal sinus rhythm Physical Exam: Blood pressure 133/63, pulse 63, temperature 97.6 F (36.4 C), temperature source Oral, resp. rate 18, height 6\' 2"  (1.88 m), weight 127 kg (279 lb 15.8 oz), SpO2 98 %. Body mass index is 35.95 kg/m. General: Well developed, well nourished, in no acute distress. Head: Normocephalic, atraumatic, sclera non-icteric, no xanthomas, nares are without discharge. Neck: No apparent masses Lungs: Normal respirations with some wheezes, no rhonchi, no rales , few basilar crackles   Heart: Regular rate and rhythm, normal S1 S2, no murmur, no rub, no gallop, PMI is normal size and placement, carotid upstroke normal without bruit, jugular venous pressure normal Abdomen: Soft,  non-tender, non-distended with normoactive bowel sounds. No hepatosplenomegaly. Abdominal aorta is normal size without bruit Extremities: Trace edema, no clubbing, no cyanosis, no ulcers,  Peripheral: 2+ radial, 2+ femoral, 2+ dorsal pedal pulses Neuro: Alert and oriented. Moves all extremities spontaneously. Psych:  Responds to questions appropriately with a normal affect.   Intake/Output Summary (Last 24 hours) at 12/10/16 0716 Last data filed at 12/10/16 0500  Gross per 24 hour  Intake            310.5 ml  Output             1100 ml  Net           -789.5 ml    Inpatient Medications:  . allopurinol  100 mg Oral Daily  . amiodarone  400 mg Oral BID  . clopidogrel  75 mg Oral Daily  . divalproex  1,000 mg Oral QHS  . divalproex  500 mg Oral Daily  . DULoxetine  60 mg Oral Daily  . fenofibrate  54 mg Oral Daily  . furosemide  40 mg Oral Daily  . insulin aspart  0-15 Units Subcutaneous TID WC  . insulin aspart  0-15 Units Subcutaneous TID WC  . insulin aspart  0-5 Units Subcutaneous QHS  . isosorbide mononitrate  60 mg Oral Daily  . levothyroxine  50 mcg Oral QAC breakfast  . metoprolol succinate  50 mg Oral Daily  . pantoprazole  40 mg Oral Daily  . pregabalin  100 mg Oral TID AC  . rOPINIRole  2 mg Oral BID  . rosuvastatin  20 mg Oral QHS  . sodium chloride flush  3 mL Intravenous Q12H  . vitamin C  500 mg Oral Daily   Infusions:  . sodium chloride Stopped (12/07/16 2127)  . sodium chloride    . heparin 1,550  Units/hr (12/10/16 4481)    Labs:  Recent Labs  12/08/16 0202 12/08/16 1419 12/09/16 0425  NA 138  --  135  K 4.2 3.0* 4.2  CL 101  --  101  CO2 28  --  24  GLUCOSE 282*  --  170*  BUN 33*  --  30*  CREATININE 1.35*  --  1.36*  CALCIUM 9.2  --  8.7*  MG  --  2.1  --     Recent Labs  12/07/16 1836  AST 31  ALT 17  ALKPHOS 69  BILITOT 0.8  PROT 8.0  ALBUMIN 3.6    Recent Labs  12/07/16 1836  12/09/16 0425 12/10/16 0509  WBC 9.1  < > 10.2  8.1  NEUTROABS 6.2  --   --   --   HGB 11.0*  < > 10.3* 10.1*  HCT 34.2*  < > 30.7* 30.4*  MCV 86.8  < > 86.6 86.4  PLT 345  < > 381 344  < > = values in this interval not displayed.  Recent Labs  12/07/16 2252 12/08/16 0202 12/08/16 1016 12/08/16 1419  TROPONINI 6.51* 11.28* 13.67* 12.22*   Invalid input(s): POCBNP No results for input(s): HGBA1C in the last 72 hours.   Weights: Filed Weights   12/07/16 1824 12/07/16 2300 12/08/16 1429  Weight: 129.3 kg (285 lb) 129 kg (284 lb 6.3 oz) 127 kg (279 lb 15.8 oz)     Radiology/Studies:  Dg Chest 2 View  Result Date: 12/07/2016 CLINICAL DATA:  Shortness of breath.  Chest pressure.  Dyspnea. EXAM: CHEST  2 VIEW COMPARISON:  Radiographs and CT 1 week prior 11/30/2016 FINDINGS: Unchanged heart size and mediastinal contours. Worsening pulmonary edema from prior exam. No confluent airspace disease. No pleural fluid. No pneumothorax. No acute osseous abnormality. IMPRESSION: Worsening pulmonary edema from prior exam. Electronically Signed   By: Jeb Levering M.D.   On: 12/07/2016 06:53   Dg Chest 2 View  Result Date: 11/28/2016 CLINICAL DATA:  Chest pain, shortness of breath. EXAM: CHEST  2 VIEW COMPARISON:  Radiographs of November 27, 2016. FINDINGS: The heart size and mediastinal contours are within normal limits. Both lungs are clear. No pneumothorax or pleural effusion is noted. The visualized skeletal structures are unremarkable. IMPRESSION: No active cardiopulmonary disease. Pulmonary edema noted on prior exam has resolved. Electronically Signed   By: Marijo Conception, M.D.   On: 11/28/2016 08:06   Dg Chest 2 View  Result Date: 11/27/2016 CLINICAL DATA:  Chest pain EXAM: CHEST  2 VIEW COMPARISON:  11/03/2016 FINDINGS: Borderline cardiomegaly. There is generalized Kerley lines and fissure thickening. No effusion or pneumothorax. Negative aorta and hila. IMPRESSION: Pulmonary interstitial edema. Electronically Signed   By: Monte Fantasia M.D.   On: 11/27/2016 08:26   Ct Angio Chest Pe W And/or Wo Contrast  Result Date: 11/30/2016 CLINICAL DATA:  Acute onset of shortness of breath and generalized chest pressure. Initial encounter. EXAM: CT ANGIOGRAPHY CHEST WITH CONTRAST TECHNIQUE: Multidetector CT imaging of the chest was performed using the standard protocol during bolus administration of intravenous contrast. Multiplanar CT image reconstructions and MIPs were obtained to evaluate the vascular anatomy. CONTRAST:  75 mL of Isovue 370 IV contrast COMPARISON:  Chest radiograph performed earlier today at 4:27 a.m., and CTA of the chest performed 10/10/2014 FINDINGS: Cardiovascular: There is no evidence of significant pulmonary embolus. Evaluation for pulmonary embolus is somewhat suboptimal due to motion artifact. The heart remains normal in size. Diffuse  coronary artery calcifications are seen. Scattered calcification is seen along the aortic arch. The proximal great vessels are grossly unremarkable in appearance. Mediastinum/Nodes: Mildly enlarged right paratracheal and superior mediastinal nodes measure up to 1.2 cm in short axis. No pericardial effusion is identified. The thyroid gland is unremarkable. No axillary lymphadenopathy is seen. Lungs/Pleura: Diffuse interstitial prominence is noted, with hazy bilateral airspace opacities, compatible with pulmonary edema. No pleural effusion or pneumothorax is seen. No dominant mass is identified. Upper Abdomen: The visualized portions of the liver and spleen are unremarkable, aside from an apparent calcified granuloma within the spleen. The visualized portions of the gallbladder, pancreas, adrenal glands and stomach are within normal limits. Musculoskeletal: No acute osseous abnormalities are identified. The visualized musculature is unremarkable in appearance. Review of the MIP images confirms the above findings. IMPRESSION: 1. No evidence of significant pulmonary embolus. 2. Diffuse  interstitial prominence and hazy bilateral airspace opacities are compatible with pulmonary edema. 3. Diffuse coronary artery calcifications seen. 4. Mildly enlarged mediastinal nodes measure up to 1.2 cm in short axis. Electronically Signed   By: Garald Balding M.D.   On: 11/30/2016 06:19   Dg Chest Port 1 View  Result Date: 12/08/2016 CLINICAL DATA:  Status post cardiac arrest EXAM: PORTABLE CHEST 1 VIEW COMPARISON:  12/07/2016 FINDINGS: There is mild bilateral interstitial prominence. There is no focal parenchymal opacity. There is no pleural effusion or pneumothorax. There is stable cardiomegaly. The osseous structures are unremarkable. IMPRESSION: Cardiomegaly with mild pulmonary vascular congestion. Electronically Signed   By: Kathreen Devoid   On: 12/08/2016 14:36   Dg Chest Port 1 View  Result Date: 12/07/2016 CLINICAL DATA:  Patient with sudden onset centralized chest pain. Shortness of breath. EXAM: PORTABLE CHEST 1 VIEW COMPARISON:  Chest radiograph 12/07/2016. FINDINGS: Pacer apparatus overlies the left hemithorax. Stable cardiomegaly. Bilateral perihilar interstitial pulmonary opacities. No pleural effusion or pneumothorax. IMPRESSION: Cardiomegaly with bilateral interstitial opacities favored to represent pulmonary edema, similar to prior. Electronically Signed   By: Lovey Newcomer M.D.   On: 12/07/2016 18:48   Dg Chest Port 1 View  Result Date: 11/30/2016 CLINICAL DATA:  Acute onset of shortness of breath and generalized chest pressure. Initial encounter. EXAM: PORTABLE CHEST 1 VIEW COMPARISON:  Chest radiograph performed 11/28/2016 FINDINGS: The lungs are well-aerated. Vascular congestion is noted. Increased interstitial markings raise concern for mild interstitial edema. There is no evidence of pleural effusion or pneumothorax. The cardiomediastinal silhouette is borderline normal in size. No acute osseous abnormalities are seen. IMPRESSION: Vascular congestion. Increased interstitial markings  raise concern for mild interstitial edema. Electronically Signed   By: Garald Balding M.D.   On: 11/30/2016 05:00     Assessment and Recommendation  66 y.o. male with known coronary artery disease status post multiple previous stents having recurrent episodes of the combined systolic diastolic dysfunction heart failure and new onset atrial fibrillation with rapid ventricular rate causing worsening heart failure ventricular fibrillation and ventricular tachycardia status post ACLS protocol Now in normal rhythm and recovering with a non-ST elevation myocardial infarction with unclear whether this is acute coronary syndrome 1. Okay to convert amiodarone to 400 mg twice per day 2. Continue anticoagulation at this time for further risk reduction in stroke with atrial fibrillation and myocardial infarction 3. Possible use of beta blocker if patient can tolerate with blood pressure status post myocardial infarction 4. Repeat echocardiogram for LV systolic dysfunction valvular heart disease since non-ST elevation myocardial infarction for further assessment and treatment of above. If  the patient has segmental LV systolic dysfunction depending on area of abnormalities would consider repeat cardiac catheterization to further treat. Otherwise will medically manage non-ST elevation myocardial infarction at this time 5. Begin ambulation and follow for worsening symptoms and causes of above and possible transfer to telemetry for further treatment listed above  Signed, Serafina Royals M.D. FACC

## 2016-12-10 NOTE — Plan of Care (Signed)
Pt just flipped back from sinus rhythm to Afib e link called awaiting MD

## 2016-12-10 NOTE — Progress Notes (Addendum)
ANTICOAGULATION CONSULT NOTE - Consult  Pharmacy Consult for Heparin Indication: chest pain/ACS  No Known Allergies  Patient Measurements: Height: 6' 2"  (188 cm) Weight: 279 lb 15.8 oz (127 kg) IBW/kg (Calculated) : 82.2 Heparin Dosing Weight: 110  Vital Signs: Temp: 96.9 F (36.1 C) (08/26 1500) Temp Source: Oral (08/26 1500) BP: 105/44 (08/26 1839) Pulse Rate: 59 (08/26 1839)  Labs:  Recent Labs  12/08/16 0202 12/08/16 1016 12/08/16 1419  12/09/16 0425 12/10/16 0509 12/10/16 1306 12/10/16 1906  HGB 10.2*  --   --   --  10.3* 10.1*  --   --   HCT 30.7*  --   --   --  30.7* 30.4*  --   --   PLT 347  --   --   --  381 344  --   --   HEPARINUNFRC 0.76* 0.55  --   < > 0.38 0.29* 0.33 0.37  CREATININE 1.35*  --   --   --  1.36*  --   --  1.62*  TROPONINI 11.28* 13.67* 12.22*  --   --   --   --   --   < > = values in this interval not displayed.  Estimated Creatinine Clearance: 65.2 mL/min (A) (by C-G formula based on SCr of 1.62 mg/dL (H)).   Medical History: Past Medical History:  Diagnosis Date  . Abnormal levels of other serum enzymes   . Anxiety   . Bipolar disorder ([place])   . BPH (benign prostatic hyperplasia)   . CAD (coronary artery disease)   . CHF (congestive heart failure) ([place])    ?  Marland Kitchen Depression   . Diabetes mellitus, type II ([place])   . Diabetes mellitus, type II, insulin dependent ([place])   . ED (erectile dysfunction)   . GERD (gastroesophageal reflux disease)   . Gout   . History of borderline personality disorder   . Hypertension   . Hypogonadism in male   . Hypothyroidism   . Insomnia   . Mood disorder ([place])   . Neuropathy   . Obesity   . OSA (obstructive sleep apnea)   . PVD (peripheral vascular disease) ([place])   . Thyroid disease     Medications:  Prescriptions Prior to Admission  Medication Sig Dispense Refill Last Dose  . allopurinol (ZYLOPRIM) 100 MG tablet Take 100 mg by mouth daily.    12/06/2016 at 0800  . amLODipine (NORVASC) 5  MG tablet Take 5 mg by mouth at bedtime.    12/06/2016 at 2000  . apixaban (ELIQUIS) 5 MG TABS tablet Take 1 tablet (5 mg total) by mouth 2 (two) times daily. 60 tablet 0 12/06/2016 at 1800  . clopidogrel (PLAVIX) 75 MG tablet Take 75 mg by mouth daily. Reported on 07/26/2015   12/06/2016 at 0800  . colchicine 0.6 MG tablet Take 0.6 mg by mouth 2 (two) times daily as needed (gout flare).    PRN at PRN  . Continuous Blood Gluc Sensor (FREESTYLE LIBRE SENSOR SYSTEM) MISC Use 1 each as directed. Please place prior to office visit as directed   Taking  . diclofenac (VOLTAREN) 75 MG EC tablet Take 75 mg by mouth daily as needed (gout flare).    PRN at PRN  . divalproex (DEPAKOTE ER) 500 MG 24 hr tablet Take 1-2 tablets (500-1,000 mg total) by mouth 2 (two) times daily. Take 58m in morning and 1007min evening 270 tablet 1 12/06/2016 at 2000  . DULoxetine (CYMBALTA) 60 MG  capsule Take 1 capsule (60 mg total) by mouth daily. 90 capsule 1 12/06/2016 at 0800  . esomeprazole (NEXIUM) 40 MG capsule Take 40 mg by mouth daily.    12/06/2016 at 0800  . fenofibrate (TRICOR) 145 MG tablet Take 145 mg by mouth daily.    12/06/2016 at 0800  . furosemide (LASIX) 40 MG tablet Take 1 tablet (40 mg total) by mouth daily. 30 tablet 0 12/06/2016 at 0800  . gemfibrozil (LOPID) 600 MG tablet gemfibrozil 600 mg tablet   12/06/2016 at 0800  . glucose 4 GM chewable tablet Chew 4 g by mouth as needed for low blood sugar.    PRN at PRN  . Insulin Disposable Pump (V-GO 30) KIT Inject 1.2 Units/hr into the skin every hour. 66 units/24 hrs (36 available bolus units--12 bolus units available per meals)   12/07/2016 at Unknown time  . insulin lispro (HUMALOG KWIKPEN) 100 UNIT/ML KiwkPen Inject into the skin See admin instructions. Used ONLY as needed if additional bolus units are needed (up to 36 units/24 hrs.)   PRN at PRN  . isosorbide mononitrate (IMDUR) 60 MG 24 hr tablet Take 60 mg by mouth daily.    12/06/2016 at 0800  . JUBLIA 10 % SOLN  Apply 1 application topically daily.   12/06/2016 at Unknown time  . levothyroxine (SYNTHROID, LEVOTHROID) 50 MCG tablet Take 50 mcg by mouth daily before breakfast.   12/06/2016 at 0800  . metFORMIN (GLUCOPHAGE) 1000 MG tablet Take 1,000 mg by mouth 2 (two) times daily with a meal. Morning & supper   12/06/2016 at 2000  . metoprolol succinate (TOPROL-XL) 50 MG 24 hr tablet Take 50 mg by mouth daily.    12/06/2016 at 0800  . nitroGLYCERIN (NITROSTAT) 0.4 MG SL tablet Place 0.4 mg under the tongue every 5 (five) minutes x 3 doses as needed for chest pain.    PRN at PRN  . pregabalin (LYRICA) 100 MG capsule Take 100 mg by mouth 3 (three) times daily before meals.    12/06/2016 at 2000  . rOPINIRole (REQUIP) 2 MG tablet Take 2 mg by mouth 2 (two) times daily.    12/06/2016 at 2000  . rosuvastatin (CRESTOR) 20 MG tablet Take 20 mg by mouth at bedtime.    12/06/2016 at 2000  . VICTOZA 18 MG/3ML SOPN Inject 1.8 mg into the skin daily.    12/06/2016 at PRN  . vitamin C (ASCORBIC ACID) 500 MG tablet Take 500 mg by mouth daily.   12/06/2016 at 0800   Scheduled:  . allopurinol  100 mg Oral Daily  . amiodarone  400 mg Oral BID  . clopidogrel  75 mg Oral Daily  . divalproex  1,000 mg Oral QHS  . divalproex  500 mg Oral Daily  . DULoxetine  60 mg Oral Daily  . fenofibrate  54 mg Oral Daily  . furosemide  40 mg Oral Daily  . insulin aspart  0-15 Units Subcutaneous TID WC  . insulin aspart  0-5 Units Subcutaneous QHS  . isosorbide mononitrate  60 mg Oral Daily  . levothyroxine  50 mcg Oral QAC breakfast  . metoprolol succinate  50 mg Oral Daily  . pantoprazole  40 mg Oral Daily  . pregabalin  100 mg Oral TID AC  . rOPINIRole  2 mg Oral BID  . rosuvastatin  20 mg Oral QHS  . sodium chloride flush  3 mL Intravenous Q12H  . vitamin C  500 mg Oral Daily  Infusions:  . sodium chloride Stopped (12/07/16 2127)  . sodium chloride    . heparin 1,550 Units/hr (12/10/16 1509)    Assessment: Pharmacy consulted  to dose and monitor heparin in this 66 year old male being treated for ACS. Patient takes apixaban as an outpatient. Last dose of apixaban was 1800 on 8/22.  Goal of Therapy:  Heparin level 0.3-0.7 units/ml Monitor platelets by anticoagulation protocol: Yes   Plan:  8/24 1600 heparin level 0.44. Will continue heparin at current rate of 1350 units/hr. Recheck HL tomorrow AM.   8/24 0200 heparin level 0.76. Decrease rate to 1350 units/hr. Recheck in 6 hours. 8/24 1000 heparin level 0.55. Will continue heparin at current rate of 1350 units/hr. Recheck HL in 6 hours.   [place] Resident  12/10/2016,7:42 PM    8/25 AM heparin level 0.38. Continue current regimen. Recheck heparin level and CBC with tomorrow AM labs.  8/26 AM heparin level 0.29. 1700 units bolus and increase rate to 1550 units/hr.  8/26: Heparin level resulted @0 .33. Will continue current heparin gtt rate. Will recheck Heparin level @ [place], PharmD  8/26 1906 HL therapeutic x 2. Continue current rate. Will recheck HL/CBC daily.  Nathan A. [place], [place].D., BCPS   8/27 AM heparin level 0.43. Continue current regimen. Recheck heparin level and CBC with tomorrow AM labs.  Sim Boast, PharmD, BCPS  12/11/16 5:31 AM

## 2016-12-10 NOTE — Progress Notes (Signed)
ANTICOAGULATION CONSULT NOTE - Consult  Pharmacy Consult for Heparin Indication: chest pain/ACS  No Known Allergies  Patient Measurements: Height: 6' 2"  (188 cm) Weight: 279 lb 15.8 oz (127 kg) IBW/kg (Calculated) : 82.2 Heparin Dosing Weight: 110  Vital Signs: Temp: 97.6 F (36.4 C) (08/26 0200) Temp Source: Oral (08/26 0200) BP: 133/63 (08/26 0400) Pulse Rate: 63 (08/26 0500)  Labs:  Recent Labs  12/07/16 1836  12/07/16 1912  12/08/16 0202 12/08/16 1016 12/08/16 1419 12/08/16 1620 12/09/16 0425 12/10/16 0509  HGB 11.0*  --   --   --  10.2*  --   --   --  10.3* 10.1*  HCT 34.2*  --   --   --  30.7*  --   --   --  30.7* 30.4*  PLT 345  --   --   --  347  --   --   --  381 344  APTT  --   --  69*  --   --   --   --   --   --   --   LABPROT  --   --  15.3*  --   --   --   --   --   --   --   INR  --   --  1.20  --   --   --   --   --   --   --   HEPARINUNFRC  --   < > 1.04*  --  0.76* 0.55  --  0.44 0.38 0.29*  CREATININE 1.63*  --   --   --  1.35*  --   --   --  1.36*  --   TROPONINI 1.20*  --   --   < > 11.28* 13.67* 12.22*  --   --   --   < > = values in this interval not displayed.  Estimated Creatinine Clearance: 77.7 mL/min (A) (by C-G formula based on SCr of 1.36 mg/dL (H)).   Medical History: Past Medical History:  Diagnosis Date  . Abnormal levels of other serum enzymes   . Anxiety   . Bipolar disorder ([place])   . BPH (benign prostatic hyperplasia)   . CAD (coronary artery disease)   . CHF (congestive heart failure) ([place])    ?  [place] Kitchen Depression   . Diabetes mellitus, type II ([place])   . Diabetes mellitus, type II, insulin dependent ([place])   . ED (erectile dysfunction)   . GERD (gastroesophageal reflux disease)   . Gout   . History of borderline personality disorder   . Hypertension   . Hypogonadism in male   . Hypothyroidism   . Insomnia   . Mood disorder ([place])   . Neuropathy   . Obesity   . OSA (obstructive sleep apnea)   . PVD (peripheral  vascular disease) ([place])   . Thyroid disease     Medications:  Prescriptions Prior to Admission  Medication Sig Dispense Refill Last Dose  . allopurinol (ZYLOPRIM) 100 MG tablet Take 100 mg by mouth daily.    12/06/2016 at 0800  . amLODipine (NORVASC) 5 MG tablet Take 5 mg by mouth at bedtime.    12/06/2016 at 2000  . apixaban (ELIQUIS) 5 MG TABS tablet Take 1 tablet (5 mg total) by mouth 2 (two) times daily. 60 tablet 0 12/06/2016 at 1800  . clopidogrel (PLAVIX) 75 MG tablet Take 75 mg by mouth daily. Reported on 07/26/2015  12/06/2016 at 0800  . colchicine 0.6 MG tablet Take 0.6 mg by mouth 2 (two) times daily as needed (gout flare).    PRN at PRN  . Continuous Blood Gluc Sensor (FREESTYLE LIBRE SENSOR SYSTEM) MISC Use 1 each as directed. Please place prior to office visit as directed   Taking  . diclofenac (VOLTAREN) 75 MG EC tablet Take 75 mg by mouth daily as needed (gout flare).    PRN at PRN  . divalproex (DEPAKOTE ER) 500 MG 24 hr tablet Take 1-2 tablets (500-1,000 mg total) by mouth 2 (two) times daily. Take 561m in morning and 10047min evening 270 tablet 1 12/06/2016 at 2000  . DULoxetine (CYMBALTA) 60 MG capsule Take 1 capsule (60 mg total) by mouth daily. 90 capsule 1 12/06/2016 at 0800  . esomeprazole (NEXIUM) 40 MG capsule Take 40 mg by mouth daily.    12/06/2016 at 0800  . fenofibrate (TRICOR) 145 MG tablet Take 145 mg by mouth daily.    12/06/2016 at 0800  . furosemide (LASIX) 40 MG tablet Take 1 tablet (40 mg total) by mouth daily. 30 tablet 0 12/06/2016 at 0800  . gemfibrozil (LOPID) 600 MG tablet gemfibrozil 600 mg tablet   12/06/2016 at 0800  . glucose 4 GM chewable tablet Chew 4 g by mouth as needed for low blood sugar.    PRN at PRN  . Insulin Disposable Pump (V-GO 30) KIT Inject 1.2 Units/hr into the skin every hour. 66 units/24 hrs (36 available bolus units--12 bolus units available per meals)   12/07/2016 at Unknown time  . insulin lispro (HUMALOG KWIKPEN) 100 UNIT/ML KiwkPen  Inject into the skin See admin instructions. Used ONLY as needed if additional bolus units are needed (up to 36 units/24 hrs.)   PRN at PRN  . isosorbide mononitrate (IMDUR) 60 MG 24 hr tablet Take 60 mg by mouth daily.    12/06/2016 at 0800  . JUBLIA 10 % SOLN Apply 1 application topically daily.   12/06/2016 at Unknown time  . levothyroxine (SYNTHROID, LEVOTHROID) 50 MCG tablet Take 50 mcg by mouth daily before breakfast.   12/06/2016 at 0800  . metFORMIN (GLUCOPHAGE) 1000 MG tablet Take 1,000 mg by mouth 2 (two) times daily with a meal. Morning & supper   12/06/2016 at 2000  . metoprolol succinate (TOPROL-XL) 50 MG 24 hr tablet Take 50 mg by mouth daily.    12/06/2016 at 0800  . nitroGLYCERIN (NITROSTAT) 0.4 MG SL tablet Place 0.4 mg under the tongue every 5 (five) minutes x 3 doses as needed for chest pain.    PRN at PRN  . pregabalin (LYRICA) 100 MG capsule Take 100 mg by mouth 3 (three) times daily before meals.    12/06/2016 at 2000  . rOPINIRole (REQUIP) 2 MG tablet Take 2 mg by mouth 2 (two) times daily.    12/06/2016 at 2000  . rosuvastatin (CRESTOR) 20 MG tablet Take 20 mg by mouth at bedtime.    12/06/2016 at 2000  . VICTOZA 18 MG/3ML SOPN Inject 1.8 mg into the skin daily.    12/06/2016 at PRN  . vitamin C (ASCORBIC ACID) 500 MG tablet Take 500 mg by mouth daily.   12/06/2016 at 0800   Scheduled:  . allopurinol  100 mg Oral Daily  . amiodarone  400 mg Oral BID  . clopidogrel  75 mg Oral Daily  . divalproex  1,000 mg Oral QHS  . divalproex  500 mg Oral Daily  . DULoxetine  60 mg Oral Daily  . fenofibrate  54 mg Oral Daily  . furosemide  40 mg Oral Daily  . heparin  1,700 Units Intravenous Once  . insulin aspart  0-15 Units Subcutaneous TID WC  . insulin aspart  0-15 Units Subcutaneous TID WC  . insulin aspart  0-5 Units Subcutaneous QHS  . isosorbide mononitrate  60 mg Oral Daily  . levothyroxine  50 mcg Oral QAC breakfast  . metoprolol succinate  50 mg Oral Daily  . pantoprazole  40  mg Oral Daily  . pregabalin  100 mg Oral TID AC  . rOPINIRole  2 mg Oral BID  . rosuvastatin  20 mg Oral QHS  . sodium chloride flush  3 mL Intravenous Q12H  . vitamin C  500 mg Oral Daily   Infusions:  . sodium chloride Stopped (12/07/16 2127)  . sodium chloride    . heparin 1,350 Units/hr (12/10/16 0520)    Assessment: Pharmacy consulted to dose and monitor heparin in this 66 year old male being treated for ACS. Patient takes apixaban as an outpatient. Last dose of apixaban was 1800 on 8/22.  Goal of Therapy:  Heparin level 0.3-0.7 units/ml Monitor platelets by anticoagulation protocol: Yes   Plan:  8/24 1600 heparin level 0.44. Will continue heparin at current rate of 1350 units/hr. Recheck HL tomorrow AM.   8/24 0200 heparin level 0.76. Decrease rate to 1350 units/hr. Recheck in 6 hours. 8/24 1000 heparin level 0.55. Will continue heparin at current rate of 1350 units/hr. Recheck HL in 6 hours.   [place] Resident  12/10/2016,6:21 AM    8/25 AM heparin level 0.38. Continue current regimen. Recheck heparin level and CBC with tomorrow AM labs.  8/26 AM heparin level 0.29. 1700 units bolus and increase rate to 1550 units/hr.  Sim Boast, PharmD, BCPS  12/10/16 6:21 AM

## 2016-12-10 NOTE — Progress Notes (Signed)
*  PRELIMINARY RESULTS* Echocardiogram 2D Echocardiogram has been performed.  Dennis Mullen 12/10/2016, 1:52 PM

## 2016-12-10 NOTE — Plan of Care (Signed)
Pt went from sinus tach to Afib this morning and was given po amiodarone and he flipped back to normal sinus

## 2016-12-10 NOTE — Progress Notes (Signed)
   12/10/16 0719  Vitals  Pulse Rate 62  ECG Heart Rate 63  Cardiac Rhythm Atrial fibrillation  Resp (!) 21  Oxygen Therapy  SpO2 96 %  ECG Intervals  QRS interval 0.09

## 2016-12-10 NOTE — Progress Notes (Signed)
   12/10/16 1000  Vitals  Pulse Rate 68  ECG Heart Rate 68  Cardiac Rhythm NSR  Resp 17  Oxygen Therapy  SpO2 100 %

## 2016-12-11 DIAGNOSIS — I4891 Unspecified atrial fibrillation: Secondary | ICD-10-CM

## 2016-12-11 LAB — CBC
HEMATOCRIT: 29.9 % — AB (ref 40.0–52.0)
Hemoglobin: 10 g/dL — ABNORMAL LOW (ref 13.0–18.0)
MCH: 28.5 pg (ref 26.0–34.0)
MCHC: 33.3 g/dL (ref 32.0–36.0)
MCV: 85.7 fL (ref 80.0–100.0)
PLATELETS: 346 10*3/uL (ref 150–440)
RBC: 3.49 MIL/uL — ABNORMAL LOW (ref 4.40–5.90)
RDW: 18.3 % — AB (ref 11.5–14.5)
WBC: 7.5 10*3/uL (ref 3.8–10.6)

## 2016-12-11 LAB — GLUCOSE, CAPILLARY
GLUCOSE-CAPILLARY: 129 mg/dL — AB (ref 65–99)
GLUCOSE-CAPILLARY: 216 mg/dL — AB (ref 65–99)
GLUCOSE-CAPILLARY: 194 mg/dL — AB (ref 65–99)
Glucose-Capillary: 226 mg/dL — ABNORMAL HIGH (ref 65–99)

## 2016-12-11 LAB — ECHOCARDIOGRAM COMPLETE
Height: 74 in
WEIGHTICAEL: 4479.75 [oz_av]

## 2016-12-11 LAB — HEPARIN LEVEL (UNFRACTIONATED): HEPARIN UNFRACTIONATED: 0.43 [IU]/mL (ref 0.30–0.70)

## 2016-12-11 MED ORDER — BISACODYL 5 MG PO TBEC: 5.0000 mg | DELAYED_RELEASE_TABLET | Freq: Every day | ORAL | Status: DC | PRN

## 2016-12-11 MED ORDER — INSULIN ASPART 100 UNIT/ML ~~LOC~~ SOLN
6.0000 [IU] | Freq: Three times a day (TID) | SUBCUTANEOUS | Status: DC
  Administered 2016-12-11 – 2016-12-13 (×6): 6 [IU] via SUBCUTANEOUS
  Filled 2016-12-11 (×6): qty 1

## 2016-12-11 NOTE — Progress Notes (Signed)
[facility] NAME: Dennis Mullen    MR#:  [id number]  DATE OF BIRTH:  09-Feb-1951  SUBJECTIVE:  CHIEF COMPLAINT:   Chief Complaint  Patient presents with  . Code STEMI   Patient had cardiac arrest with ventricular fibrillation on 12/08/2016. At 5 minutes of CPR.  On [place] today. Feels well except chest pain since CPR  REVIEW OF SYSTEMS:    Review of Systems  Constitutional: Positive for malaise/fatigue. Negative for chills and fever.  HENT: Negative for sore throat.   Eyes: Negative for blurred vision, double vision and pain.  Respiratory: Positive for cough and shortness of breath. Negative for hemoptysis and wheezing.   Cardiovascular: Positive for chest pain. Negative for palpitations, orthopnea and leg swelling.  Gastrointestinal: Negative for abdominal pain, constipation, diarrhea, heartburn, nausea and vomiting.  Genitourinary: Negative for dysuria and hematuria.  Musculoskeletal: Negative for back pain and joint pain.  Skin: Negative for rash.  Neurological: Positive for weakness. Negative for sensory change, speech change, focal weakness and headaches.  Endo/Heme/Allergies: Does not bruise/bleed easily.  Psychiatric/Behavioral: Negative for depression. The patient is not nervous/anxious.     DRUG ALLERGIES:  No Known Allergies  VITALS:  Blood pressure 102/73, pulse (!) 38, temperature 97.6 F (36.4 C), temperature source Oral, resp. rate 19, height 6\' 2"  (1.88 m), weight 127 kg (279 lb 15.8 oz), SpO2 97 %.  PHYSICAL EXAMINATION:   Physical Exam  GENERAL:  66 y.o.-year-old patient lying in the bed   EYES: Pupils equal, round, reactive to light and accommodation. No scleral icterus. Extraocular muscles intact.  HEENT: Head atraumatic, normocephalic. Oropharynx and nasopharynx clear.  NECK:  Supple, no jugular venous distention. No thyroid enlargement, no tenderness.  LUNGS: Normal breath sounds bilaterally,Decreased  air entry CARDIOVASCULAR: S1, S2 normal. No murmurs, rubs, or gallops.  ABDOMEN: Soft, nontender, nondistended. Bowel sounds present. No organomegaly or mass.  EXTREMITIES: No cyanosis, clubbing or edema b/l.    NEUROLOGIC: Cranial nerves II through XII are intact. No focal Motor or sensory deficits b/l.   PSYCHIATRIC: The patient is alert and oriented x 3.  SKIN: No obvious rash, lesion, or ulcer.   LABORATORY PANEL:   CBC  Recent Labs Lab 12/11/16 0424  WBC 7.5  HGB 10.0*  HCT 29.9*  PLT 346   ------------------------------------------------------------------------------------------------------------------ Chemistries   Recent Labs Lab 12/07/16 1836  12/10/16 1906  NA 135  < > 134*  K 4.5  < > 4.4  CL 97*  < > 99*  CO2 24  < > 25  GLUCOSE 494*  < > 243*  BUN 32*  < > 27*  CREATININE 1.63*  < > 1.62*  CALCIUM 9.6  < > 9.1  MG  --   < > 2.0  AST 31  --   --   ALT 17  --   --   ALKPHOS 69  --   --   BILITOT 0.8  --   --   < > = values in this interval not displayed. ------------------------------------------------------------------------------------------------------------------  Cardiac Enzymes  Recent Labs Lab 12/08/16 1419  TROPONINI 12.22*   ------------------------------------------------------------------------------------------------------------------  RADIOLOGY:  Dg Chest Port 1 View  Result Date: 12/10/2016 CLINICAL DATA:  Dyspnea.  Cardiac arrest 2 days ago. EXAM: PORTABLE CHEST 1 VIEW COMPARISON:  12/08/2016 FINDINGS: Heart size remains within normal limits allowing for portable and lordotic positioning. Low lung volumes are noted, however there is no evidence of pulmonary infiltrate or definite pleural  effusion. IMPRESSION: No active disease. Electronically Signed   By: Earle Gell M.D.   On: 12/10/2016 09:40    ASSESSMENT AND PLAN:   * Atrial fibrillation with rapid ventricular rate. Patient continues on amiodarone drip and improved. On heparin  drip.  * Non-ST elevation MI  Patient had recent cardiac catheterization with moderate CAD in multiple vessels. Medical management advised. This was unchanged from his catheterization in 02/ 2018. Seen by Dr. Clayborn Bigness in the emergency room  due to concern for ST elevation MI. Discussed with Dr. [name]. Troponin peaked at 12.2. On Plavix, statin, beta blocker. Heparin drip. Echocardiogram pending.  * Ventricular fibrillation with cardiac arrest.Status post resuscitation. On amiodarone drip.  * Atrial fibrillation with rapid ventricular rate. On amiodarone.  * Acute on chronic diastolic congestive heart failure. On Lasix.   * Acute hypoxic respiratory failure due to CHF. Wean oxygen as tolerated. Nebulizers when necessary. On high flow nasal cannula  * Diabetes mellitus type 2. Sliding scale insulin. Consult diabetes coordinator patient is on insulin pump at home  * CKD stage III. Monitor while on Lasix.  All the records are reviewed and case discussed with Care Management/Social Worker Management plans discussed with the patient, family and they are in agreement.  CODE STATUS: FULL CODE  DVT Prophylaxis: SCDs  TOTAL TIME TAKING CARE OF THIS PATIENT: 35 minutes  Hillary Bow R M.D on 12/11/2016 at 6:27 PM  Between 7am to 6pm - Pager - [phone]  After 6pm go to [email or web address] - password [email or web address] [facility]  Office  [phone]  CC: Primary care physician; Lavera Guise, MD  Note: This dictation was prepared with Dragon dictation along with smaller phrase technology. Any transcriptional errors that result from this process are unintentional.

## 2016-12-11 NOTE — Progress Notes (Signed)
[facility] Encounter Note  Patient: Dennis Mullen / Admit Date: 12/07/2016 / Date of Encounter: 12/11/2016, 8:25 AM   Subjective: Patient has significant amount of chest soreness from ACLS protocol and chest compressions but slowly improving over the last few days. Patient has continued EKG changes with diffuse ST depression and T-wave inversions slightly different from previous admission. Cardiac catheterization from previous admission evaluated showing patent stents and no evidence of progression of coronary disease requiring further intervention and ejection fraction 55%. Currently the patient remains in normal sinus rhythm on amiodarone after of V. tach V. fib several days ago and had an episode of atrial fibrillation last night Patient does have elevated troponin consistent with non-ST elevation myocardial infarction although it is unclear whether this is due to coronary artery occlusion and/or acute coronary syndrome or demand ischemia at this time. Patient wishes to use of medication management and no further intervention at this time  Review of Systems: Positive for: Weakness and fatigue Negative for: Vision change, hearing change, syncope, dizziness, nausea, vomiting,diarrhea, bloody stool, stomach pain, cough, congestion, diaphoresis, urinary frequency, urinary pain,skin lesions, skin rashes Others previously listed  Objective: Telemetry: Normal sinus rhythm Physical Exam: Blood pressure 135/64, pulse (!) 56, temperature 97.9 F (36.6 C), temperature source Oral, resp. rate 19, height 6\' 2"  (1.88 m), weight 127 kg (279 lb 15.8 oz), SpO2 92 %. Body mass index is 35.95 kg/m. General: Well developed, well nourished, in no acute distress. Head: Normocephalic, atraumatic, sclera non-icteric, no xanthomas, nares are without discharge. Neck: No apparent masses Lungs: Normal respirations with some wheezes, no rhonchi, no rales , few basilar crackles   Heart: Regular  rate and rhythm, normal S1 S2, no murmur, no rub, no gallop, PMI is normal size and placement, carotid upstroke normal without bruit, jugular venous pressure normal Abdomen: Soft, non-tender, non-distended with normoactive bowel sounds. No hepatosplenomegaly. Abdominal aorta is normal size without bruit Extremities: Trace edema, no clubbing, no cyanosis, no ulcers,  Peripheral: 2+ radial, 2+ femoral, 2+ dorsal pedal pulses Neuro: Alert and oriented. Moves all extremities spontaneously. Psych:  Responds to questions appropriately with a normal affect.   Intake/Output Summary (Last 24 hours) at 12/11/16 0825 Last data filed at 12/11/16 0600  Gross per 24 hour  Intake          1086.06 ml  Output             1900 ml  Net          -813.94 ml    Inpatient Medications:  . allopurinol  100 mg Oral Daily  . amiodarone  400 mg Oral BID  . clopidogrel  75 mg Oral Daily  . divalproex  1,000 mg Oral QHS  . divalproex  500 mg Oral Daily  . DULoxetine  60 mg Oral Daily  . fenofibrate  54 mg Oral Daily  . furosemide  40 mg Oral Daily  . insulin aspart  0-15 Units Subcutaneous TID WC  . insulin aspart  0-5 Units Subcutaneous QHS  . isosorbide mononitrate  60 mg Oral Daily  . levothyroxine  50 mcg Oral QAC breakfast  . metoprolol succinate  50 mg Oral Daily  . pantoprazole  40 mg Oral Daily  . pregabalin  100 mg Oral TID AC  . rOPINIRole  2 mg Oral BID  . rosuvastatin  20 mg Oral QHS  . sodium chloride flush  3 mL Intravenous Q12H  . vitamin C  500 mg Oral Daily  Infusions:  . sodium chloride Stopped (12/07/16 2127)  . sodium chloride    . heparin 1,550 Units/hr (12/11/16 0722)    Labs:  Recent Labs  12/08/16 1419 12/09/16 0425 12/10/16 1906  NA  --  135 134*  K 3.0* 4.2 4.4  CL  --  101 99*  CO2  --  24 25  GLUCOSE  --  170* 243*  BUN  --  30* 27*  CREATININE  --  1.36* 1.62*  CALCIUM  --  8.7* 9.1  MG 2.1  --  2.0   No results for input(s): AST, ALT, ALKPHOS, BILITOT,  PROT, ALBUMIN in the last 72 hours.  Recent Labs  12/10/16 0509 12/11/16 0424  WBC 8.1 7.5  HGB 10.1* 10.0*  HCT 30.4* 29.9*  MCV 86.4 85.7  PLT 344 346    Recent Labs  12/08/16 1016 12/08/16 1419  TROPONINI 13.67* 12.22*   Invalid input(s): POCBNP No results for input(s): HGBA1C in the last 72 hours.   Weights: Filed Weights   12/07/16 1824 12/07/16 2300 12/08/16 1429  Weight: 129.3 kg (285 lb) 129 kg (284 lb 6.3 oz) 127 kg (279 lb 15.8 oz)     Radiology/Studies:  Dg Chest 2 View  Result Date: 12/07/2016 CLINICAL DATA:  Shortness of breath.  Chest pressure.  Dyspnea. EXAM: CHEST  2 VIEW COMPARISON:  Radiographs and CT 1 week prior 11/30/2016 FINDINGS: Unchanged heart size and mediastinal contours. Worsening pulmonary edema from prior exam. No confluent airspace disease. No pleural fluid. No pneumothorax. No acute osseous abnormality. IMPRESSION: Worsening pulmonary edema from prior exam. Electronically Signed   By: Jeb Levering M.D.   On: 12/07/2016 06:53   Dg Chest 2 View  Result Date: 11/28/2016 CLINICAL DATA:  Chest pain, shortness of breath. EXAM: CHEST  2 VIEW COMPARISON:  Radiographs of November 27, 2016. FINDINGS: The heart size and mediastinal contours are within normal limits. Both lungs are clear. No pneumothorax or pleural effusion is noted. The visualized skeletal structures are unremarkable. IMPRESSION: No active cardiopulmonary disease. Pulmonary edema noted on prior exam has resolved. Electronically Signed   By: Marijo Conception, M.D.   On: 11/28/2016 08:06   Dg Chest 2 View  Result Date: 11/27/2016 CLINICAL DATA:  Chest pain EXAM: CHEST  2 VIEW COMPARISON:  11/03/2016 FINDINGS: Borderline cardiomegaly. There is generalized Kerley lines and fissure thickening. No effusion or pneumothorax. Negative aorta and hila. IMPRESSION: Pulmonary interstitial edema. Electronically Signed   By: Monte Fantasia M.D.   On: 11/27/2016 08:26   Ct Angio Chest Pe W And/or Wo  Contrast  Result Date: 11/30/2016 CLINICAL DATA:  Acute onset of shortness of breath and generalized chest pressure. Initial encounter. EXAM: CT ANGIOGRAPHY CHEST WITH CONTRAST TECHNIQUE: Multidetector CT imaging of the chest was performed using the standard protocol during bolus administration of intravenous contrast. Multiplanar CT image reconstructions and MIPs were obtained to evaluate the vascular anatomy. CONTRAST:  75 mL of Isovue 370 IV contrast COMPARISON:  Chest radiograph performed earlier today at 4:27 a.m., and CTA of the chest performed 10/10/2014 FINDINGS: Cardiovascular: There is no evidence of significant pulmonary embolus. Evaluation for pulmonary embolus is somewhat suboptimal due to motion artifact. The heart remains normal in size. Diffuse coronary artery calcifications are seen. Scattered calcification is seen along the aortic arch. The proximal great vessels are grossly unremarkable in appearance. Mediastinum/Nodes: Mildly enlarged right paratracheal and superior mediastinal nodes measure up to 1.2 cm in short axis. No pericardial effusion is identified.  The thyroid gland is unremarkable. No axillary lymphadenopathy is seen. Lungs/Pleura: Diffuse interstitial prominence is noted, with hazy bilateral airspace opacities, compatible with pulmonary edema. No pleural effusion or pneumothorax is seen. No dominant mass is identified. Upper Abdomen: The visualized portions of the liver and spleen are unremarkable, aside from an apparent calcified granuloma within the spleen. The visualized portions of the gallbladder, pancreas, adrenal glands and stomach are within normal limits. Musculoskeletal: No acute osseous abnormalities are identified. The visualized musculature is unremarkable in appearance. Review of the MIP images confirms the above findings. IMPRESSION: 1. No evidence of significant pulmonary embolus. 2. Diffuse interstitial prominence and hazy bilateral airspace opacities are compatible  with pulmonary edema. 3. Diffuse coronary artery calcifications seen. 4. Mildly enlarged mediastinal nodes measure up to 1.2 cm in short axis. Electronically Signed   By: Garald Balding M.D.   On: 11/30/2016 06:19   Dg Chest Port 1 View  Result Date: 12/10/2016 CLINICAL DATA:  Dyspnea.  Cardiac arrest 2 days ago. EXAM: PORTABLE CHEST 1 VIEW COMPARISON:  12/08/2016 FINDINGS: Heart size remains within normal limits allowing for portable and lordotic positioning. Low lung volumes are noted, however there is no evidence of pulmonary infiltrate or definite pleural effusion. IMPRESSION: No active disease. Electronically Signed   By: Earle Gell M.D.   On: 12/10/2016 09:40   Dg Chest Port 1 View  Result Date: 12/08/2016 CLINICAL DATA:  Status post cardiac arrest EXAM: PORTABLE CHEST 1 VIEW COMPARISON:  12/07/2016 FINDINGS: There is mild bilateral interstitial prominence. There is no focal parenchymal opacity. There is no pleural effusion or pneumothorax. There is stable cardiomegaly. The osseous structures are unremarkable. IMPRESSION: Cardiomegaly with mild pulmonary vascular congestion. Electronically Signed   By: Kathreen Devoid   On: 12/08/2016 14:36   Dg Chest Port 1 View  Result Date: 12/07/2016 CLINICAL DATA:  Patient with sudden onset centralized chest pain. Shortness of breath. EXAM: PORTABLE CHEST 1 VIEW COMPARISON:  Chest radiograph 12/07/2016. FINDINGS: Pacer apparatus overlies the left hemithorax. Stable cardiomegaly. Bilateral perihilar interstitial pulmonary opacities. No pleural effusion or pneumothorax. IMPRESSION: Cardiomegaly with bilateral interstitial opacities favored to represent pulmonary edema, similar to prior. Electronically Signed   By: Lovey Newcomer M.D.   On: 12/07/2016 18:48   Dg Chest Port 1 View  Result Date: 11/30/2016 CLINICAL DATA:  Acute onset of shortness of breath and generalized chest pressure. Initial encounter. EXAM: PORTABLE CHEST 1 VIEW COMPARISON:  Chest radiograph  performed 11/28/2016 FINDINGS: The lungs are well-aerated. Vascular congestion is noted. Increased interstitial markings raise concern for mild interstitial edema. There is no evidence of pleural effusion or pneumothorax. The cardiomediastinal silhouette is borderline normal in size. No acute osseous abnormalities are seen. IMPRESSION: Vascular congestion. Increased interstitial markings raise concern for mild interstitial edema. Electronically Signed   By: Garald Balding M.D.   On: 11/30/2016 05:00     Assessment and Recommendation  66 y.o. male with known coronary artery disease status post multiple previous stents having recurrent episodes of the combined systolic diastolic dysfunction heart failure and new onset atrial fibrillation with rapid ventricular rate causing worsening heart failure ventricular fibrillation and ventricular tachycardia status post ACLS protocol Now in normal rhythm and recovering with a non-ST elevation myocardial infarction with unclear whether this is acute coronary syndrome 1. Continue amiodarone to 400 mg twice per day for maintenance of normal sinus rhythm 2. Continue anticoagulation at this time for further risk reduction in stroke with atrial fibrillation and myocardial infarction and convert to  elequis 5 mg twice per day for further risk reduction long-term 3. Possible use of beta blocker if patient can tolerate with blood pressure status post myocardial infarction 4.  Attempted repeat echocardiogram with patient not being compliant and cannot assess for regional wall motion abnormalities 5. Begin ambulation and follow for worsening symptoms and causes of above and possible transfer to telemetry for further treatment listed above 6. Continued cardiac rehabilitation and ambulation and follow for improvements and if doing well would discharge to home with outpatient follow-up for further adjustments 7. No further cardiac intervention at this time due to patient's wishes  to continue medication management for all of issues above  Signed, Serafina Royals M.D. FACC

## 2016-12-11 NOTE — Progress Notes (Signed)
While rounding, [place] made a follow up visit. Pt was sitting on the edge of his bed. Pt stated he is feeling much better. RN stated they were going to transfer him to a floor unit soon. [place] spent some time in conversation with Pt on life matters. Pt states he is in a good state. CH provided the ministry of empathetic listening. CH is available for follow up as needed.    12/11/16 1600  Clinical Encounter Type  Visited With Patient;Health care provider  Visit Type Follow-up  Consult/Referral To Chaplain  Spiritual Encounters  Spiritual Needs Emotional

## 2016-12-11 NOTE — Progress Notes (Signed)
Inpatient Diabetes Program Recommendations  AACE/ADA: New Consensus Statement on Inpatient Glycemic Control (2015)  Target Ranges:  Prepandial:   less than 140 mg/dL      Peak postprandial:   less than 180 mg/dL (1-2 hours)      Critically ill patients:  140 - 180 mg/dL   Results for CLENTON, ESPER (MRN [id number]) as of 12/11/2016 07:39  Ref. Range 12/10/2016 07:49 12/10/2016 12:23 12/10/2016 16:22 12/10/2016 21:28  Glucose-Capillary Latest Ref Range: 65 - 99 mg/dL 154 (H) 256 (H) 293 (H) 257 (H)    Admit with: CP/ A Fib with RVR  History: DM  Home DM Meds: VGO 30 Disposable Insulin Pump (provides patient 30 units basal insulin per 24 hour period and patient takes 6 "clicks" that total 12 units bolus insulin per meal)                             Victoza 1.8 mg daily                             Metformin 1000 mg BID  Current Insulin Orders: Novolog Moderate Correction Scale/ SSI (0-15 units) TID AC + HS       MD- Patient saw his Endocrinologist (Dr. Eddie Dibbles with [facility]) on 11/07/16.  At that visit, patient was instructed to continue his current diabetes regimen that includes the following:  1. VGO 30 Disposable Insulin Pump (provides patient 30 units basal insulin per day and patient takes 6 "clicks" that total 12 units bolus insulin per meal) 2. Victoza 1.8 mg daily 3. Metformin 1000 mg BID   MD- Patient having significant glucose elevations after meals here in hospital.  Remains off Home Disposable Insulin Pump.  Please consider the following in-hospital insulin adjustments:  Start Novolog Meal Coverage: Novolog 6 units TID with meals (hold if pt eats <50% of meal)      --Will follow patient during hospitalization--  Wyn Quaker RN, MSN, CDE Diabetes Coordinator Inpatient Glycemic Control Team Team Pager: [phone] (8a-5p)

## 2016-12-11 NOTE — Progress Notes (Signed)
Patient remained in NSR this shift. Administered po amiodarone as ordered at HS. Did not complain of chest soreness this shift. Complained at times of not being able to catch his breath when he awakens from deep sleep. Pt has been having periods of sleep apnea. Venturi mask applied when patient asleep. Discussed doing this with RT earlier in the shift.

## 2016-12-11 NOTE — Progress Notes (Signed)
  Dennis Mullen    SYNOPSIS   Patient with readmission after V. Fib/V. Tach arrest on the floor. Required CPR and defibrillation  ASSESSMENT/PLAN   NSTEMI. Peak troponin of 12.22.  Follow up cardiology recs  Hypoxemia. resolved  Hyperglycemia. On coverage  Renal insufficiency. Ill check BMP in the morning     Intake/Output Summary (Last 24 hours) at 12/11/16 0927 Last data filed at 12/11/16 0900  Gross per 24 hour  Intake          1237.06 ml  Output             2200 ml  Net          -962.94 ml   MRN: [id number] DOB: Dennis Mullen, Dennis Mullen     SUBJECTIVE:   Patient doing well this morning. . Presently he is awake alert in no acute distress.  Weaned off biPAP  VITAL SIGNS: Temp:  [96.9 F (36.1 C)-98 F (36.7 C)] 97.9 F (36.6 C) (08/27 0800) Pulse Rate:  [54-89] 56 (08/27 0800) Resp:  [12-27] 19 (08/27 0800) BP: (101-135)/(38-110) 135/64 (08/27 0800) SpO2:  [87 %-100 %] 92 % (08/27 0800) FiO2 (%):  [35 %-50 %] 35 % (08/Mullen 1337)   PHYSICAL EXAMINATION: Physical Examination:   VS: BP 135/64   Pulse (!) 56   Temp 97.9 F (36.6 C) (Oral)   Resp 19   Ht 6\' 2"  (1.88 m)   Wt 279 lb 15.8 oz (127 kg)   SpO2 92%   BMI 35.95 kg/m   General Appearance: No distress  Neuro:without focal findings, mental status normal. HEENT: PERRLA, EOM intact. Pulmonary: normal breath sounds   CardiovascularNormal S1,S2.  No m/r/g.   Skin:   warm, no rashes, no ecchymosis  Extremities: normal, no cyanosis, clubbing.    LABORATORY PANEL:   CBC  Recent Labs Lab 12/11/16 0424  WBC 7.5  HGB 10.0*  HCT 29.9*  PLT 346    Chemistries   Recent Labs Lab 12/07/16 1836  08/Mullen/18 1906  NA 135  < > 134*  K 4.5  < > 4.4  CL 97*  < > 99*  CO2 24  < > 25  GLUCOSE 494*  < > 243*  BUN 32*  < > 27*  CREATININE 1.63*  < > 1.62*  CALCIUM 9.6  < > 9.1  MG  --   < > 2.0  AST 31  --   --   ALT 17  --   --   ALKPHOS 69  --   --   BILITOT 0.8  --    --   < > = values in this interval not displayed.   Recent Labs Lab 08/Mullen/18 0050 08/Mullen/18 0749 08/Mullen/18 1223 08/Mullen/18 1622 08/Mullen/18 2128 12/11/16 0738  GLUCAP 207* 154* 256* 293* 257* 129*   No results for input(s): PHART, PCO2ART, PO2ART in the last 168 hours.  Recent Labs Lab 12/07/16 1836  AST 31  ALT 17  ALKPHOS 69  BILITOT 0.8  ALBUMIN 3.6     Ok to transfer to gen med floor   Ricky Doan Patricia Pesa, M.D.  [facility]  Medical Director [place] Director [facility] Cardio-Pulmonary Department

## 2016-12-11 NOTE — Progress Notes (Signed)
[place] [facility] [place] NAME: Dennis Mullen    MR#:  [id number]  DATE OF BIRTH:  06/22/1950  SUBJECTIVE:  CHIEF COMPLAINT:   Chief Complaint  Patient presents with  . Code STEMI   Patient had cardiac arrest with ventricular fibrillation on 12/08/2016. At 5 minutes of CPR.   Chest soreness since CPR Afebrile  REVIEW OF SYSTEMS:    Review of Systems  Constitutional: Positive for malaise/fatigue. Negative for chills and fever.  HENT: Negative for sore throat.   Eyes: Negative for blurred vision, double vision and pain.  Respiratory: Positive for cough and shortness of breath. Negative for hemoptysis and wheezing.   Cardiovascular: Positive for chest pain. Negative for palpitations, orthopnea and leg swelling.  Gastrointestinal: Negative for abdominal pain, constipation, diarrhea, heartburn, nausea and vomiting.  Genitourinary: Negative for dysuria and hematuria.  Musculoskeletal: Negative for back pain and joint pain.  Skin: Negative for rash.  Neurological: Positive for weakness. Negative for sensory change, speech change, focal weakness and headaches.  Endo/Heme/Allergies: Does not bruise/bleed easily.  Psychiatric/Behavioral: Negative for depression. The patient is not nervous/anxious.     DRUG ALLERGIES:  No Known Allergies  VITALS:  Blood pressure 102/73, pulse (!) 38, temperature 97.6 F (36.4 C), temperature source Oral, resp. rate 19, height 6\' 2"  (1.88 m), weight 127 kg (279 lb 15.8 oz), SpO2 97 %.  PHYSICAL EXAMINATION:   Physical Exam  GENERAL:  66 y.o.-year-old patient lying in the bed   EYES: Pupils equal, round, reactive to light and accommodation. No scleral icterus. Extraocular muscles intact.  HEENT: Head atraumatic, normocephalic. Oropharynx and nasopharynx clear.  NECK:  Supple, no jugular venous distention. No thyroid enlargement, no tenderness.  LUNGS: Normal breath sounds bilaterally,Decreased air  entry CARDIOVASCULAR: S1, S2 normal. No murmurs, rubs, or gallops.  ABDOMEN: Soft, nontender, nondistended. Bowel sounds present. No organomegaly or mass.  EXTREMITIES: No cyanosis, clubbing or edema b/l.    NEUROLOGIC: Cranial nerves II through XII are intact. No focal Motor or sensory deficits b/l.   PSYCHIATRIC: The patient is alert and oriented x 3.  SKIN: No obvious rash, lesion, or ulcer.   LABORATORY PANEL:   CBC  Recent Labs Lab 12/11/16 0424  WBC 7.5  HGB 10.0*  HCT 29.9*  PLT 346   ------------------------------------------------------------------------------------------------------------------ Chemistries   Recent Labs Lab 12/07/16 1836  12/10/16 1906  NA 135  < > 134*  K 4.5  < > 4.4  CL 97*  < > 99*  CO2 24  < > 25  GLUCOSE 494*  < > 243*  BUN 32*  < > 27*  CREATININE 1.63*  < > 1.62*  CALCIUM 9.6  < > 9.1  MG  --   < > 2.0  AST 31  --   --   ALT 17  --   --   ALKPHOS 69  --   --   BILITOT 0.8  --   --   < > = values in this interval not displayed. ------------------------------------------------------------------------------------------------------------------  Cardiac Enzymes  Recent Labs Lab 12/08/16 1419  TROPONINI 12.22*   ------------------------------------------------------------------------------------------------------------------  RADIOLOGY:  Dg Chest Port 1 View  Result Date: 12/10/2016 CLINICAL DATA:  Dyspnea.  Cardiac arrest 2 days ago. EXAM: PORTABLE CHEST 1 VIEW COMPARISON:  12/08/2016 FINDINGS: Heart size remains within normal limits allowing for portable and lordotic positioning. Low lung volumes are noted, however there is no evidence of pulmonary infiltrate or definite pleural effusion. IMPRESSION: No active  disease. Electronically Signed   By: Earle Gell M.D.   On: 12/10/2016 09:40    ASSESSMENT AND PLAN:   * Atrial fibrillation with rapid ventricular rate. Patient continues on amiodarone drip and improved. On heparin  drip.  * Non-ST elevation MI  Patient had recent cardiac catheterization with moderate CAD in multiple vessels. Medical management advised. This was unchanged from his catheterization in 02/ 2018. Seen by Dr. Clayborn Bigness in the emergency room  due to concern for ST elevation MI. Discussed with Dr. [name]. Troponin peaked at 12.2. On Plavix, statin, beta blocker. Heparin drip. Echocardiogram shows anterior wall motion abnormalities which are new. Will need cardiac cath.  * Ventricular fibrillation with cardiac arrest.Status post resuscitation. On amiodarone  * Atrial fibrillation with rapid ventricular rate. On amiodarone.  * Acute on chronic diastolic congestive heart failure. On Lasix.   * Acute hypoxic respiratory failure due to CHF. Wean oxygen as tolerated. Nebulizers when necessary. ON 5 l [place]  * Diabetes mellitus type 2. Sliding scale insulin.  * CKD stage III. Monitor while on Lasix.  All the records are reviewed and case discussed with Care Management/Social Worker Management plans discussed with the patient, family and they are in agreement.  CODE STATUS: FULL CODE  DVT Prophylaxis: SCDs  TOTAL TIME TAKING CARE OF THIS PATIENT: 35 minutes  Hillary Bow R M.D on 12/11/2016 at 8:03 PM  Between 7am to 6pm - Pager - [phone]  After 6pm go to [email or web address] - password [email or web address] [facility]  Office  [phone]  CC: Primary care physician; Lavera Guise, MD  Note: This dictation was prepared with Dragon dictation along with smaller phrase technology. Any transcriptional errors that result from this process are unintentional.

## 2016-12-11 NOTE — Progress Notes (Signed)
Chaplain made a follow up visit with pt. Pt was sitting on bed watching TV at the time of this visit. Pt states he was making steady progress and was appreciative of the care provided to him. Pt states his former church member visited, pastor and chaplains visited him today. Pt also mentioned that he would be moved to [id number]. Pt was pleasantly delightful and welcomed prayers from the [place]. CH encourage pt and prayer with pt. [place] to follow up pt as needed.    12/11/16 1636  Clinical Encounter Type  Visited With Patient;Health care provider  Visit Type Follow-up  Consult/Referral To Chaplain  Spiritual Encounters  Spiritual Needs Prayer;Emotional

## 2016-12-12 LAB — CBC
HCT: 31.3 % — ABNORMAL LOW (ref 40.0–52.0)
Hemoglobin: 10.4 g/dL — ABNORMAL LOW (ref 13.0–18.0)
MCH: 28.3 pg (ref 26.0–34.0)
MCHC: 33.2 g/dL (ref 32.0–36.0)
MCV: 85.3 fL (ref 80.0–100.0)
PLATELETS: 395 10*3/uL (ref 150–440)
RBC: 3.67 MIL/uL — ABNORMAL LOW (ref 4.40–5.90)
RDW: 17.9 % — AB (ref 11.5–14.5)
WBC: 8.2 10*3/uL (ref 3.8–10.6)

## 2016-12-12 LAB — BASIC METABOLIC PANEL
ANION GAP: 9 (ref 5–15)
BUN: 27 mg/dL — AB (ref 6–20)
CALCIUM: 9.2 mg/dL (ref 8.9–10.3)
CO2: 28 mmol/L (ref 22–32)
CREATININE: 1.28 mg/dL — AB (ref 0.61–1.24)
Chloride: 100 mmol/L — ABNORMAL LOW (ref 101–111)
GFR calc Af Amer: >60 mL/min (ref >60)
GFR, EST NON AFRICAN AMERICAN: 58 mL/min — AB (ref >60)
GLUCOSE: 191 mg/dL — AB (ref 65–99)
Potassium: 4.1 mmol/L (ref 3.5–5.1)
Sodium: 137 mmol/L (ref 135–145)

## 2016-12-12 LAB — GLUCOSE, CAPILLARY
GLUCOSE-CAPILLARY: 154 mg/dL — AB (ref 65–99)
GLUCOSE-CAPILLARY: 237 mg/dL — AB (ref 65–99)
Glucose-Capillary: 213 mg/dL — ABNORMAL HIGH (ref 65–99)
Glucose-Capillary: 153 mg/dL — ABNORMAL HIGH (ref 65–99)

## 2016-12-12 LAB — HEPARIN LEVEL (UNFRACTIONATED): Heparin Unfractionated: 0.34 IU/mL (ref 0.30–0.70)

## 2016-12-12 MED ORDER — AMIODARONE HCL 200 MG PO TABS
400.0000 mg | ORAL_TABLET | Freq: Two times a day (BID) | ORAL | Status: DC
  Administered 2016-12-12 – 2016-12-13 (×2): 400 mg via ORAL
  Filled 2016-12-12 (×2): qty 2

## 2016-12-12 MED ORDER — AMIODARONE HCL 200 MG PO TABS
400.0000 mg | ORAL_TABLET | Freq: Every day | ORAL | Status: DC
  Administered 2016-12-12: 400 mg via ORAL
  Filled 2016-12-12: qty 2

## 2016-12-12 MED ORDER — APIXABAN 5 MG PO TABS
5.0000 mg | ORAL_TABLET | Freq: Two times a day (BID) | ORAL | Status: DC
  Administered 2016-12-12 – 2016-12-13 (×2): 5 mg via ORAL
  Filled 2016-12-12 (×2): qty 1

## 2016-12-12 MED ORDER — ISOSORBIDE MONONITRATE ER 30 MG PO TB24
30.0000 mg | ORAL_TABLET | Freq: Every day | ORAL | Status: DC
  Administered 2016-12-13: 30 mg via ORAL
  Filled 2016-12-12: qty 1

## 2016-12-12 MED ORDER — LORAZEPAM 1 MG PO TABS
1.0000 mg | ORAL_TABLET | Freq: Four times a day (QID) | ORAL | Status: DC | PRN
  Administered 2016-12-12 – 2016-12-13 (×2): 1 mg via ORAL
  Filled 2016-12-12 (×2): qty 1

## 2016-12-12 NOTE — Progress Notes (Signed)
Inpatient Diabetes Program Recommendations  AACE/ADA: New Consensus Statement on Inpatient Glycemic Control (2015)  Target Ranges:  Prepandial:   less than 140 mg/dL      Peak postprandial:   less than 180 mg/dL (1-2 hours)      Critically ill patients:  140 - 180 mg/dL   Lab Results  Component Value Date   GLUCAP 154 (H) 12/12/2016   HGBA1C 7.3 (H) 11/30/2016    Review of Glycemic ControlResults for RAFAN, SANDERS (MRN [id number]) as of 12/12/2016 10:37  Ref. Range 12/10/2016 21:28 12/11/2016 07:38 12/11/2016 11:33 12/11/2016 16:55 12/11/2016 21:21 12/12/2016 07:19  Glucose-Capillary Latest Ref Range: 65 - 99 mg/dL 257 (H) 129 (H) 216 (H) 194 (H) 226 (H) 154 (H)    Diabetes history: Type 2 DM Outpatient Diabetes medications: VGO 30 Disposable Insulin Pump (provides patient 30 units basal insulin per 24 hour period and patient takes 6 "clicks" that total 12 units bolus insulin per meal) Victoza 1.8 mg daily Metformin 1000 mg BID  Current orders for Inpatient glycemic control:  Novolog moderate tid with meals and HS, Novolog 6 units tid with meals Inpatient Diabetes Program Recommendations:   May consider adding Lantus 15 units daily (this is approximately 1/2 of home dose of basal insulin).    Thanks, Adah Perl, RN, BC-ADM Inpatient Diabetes Coordinator Pager [phone] (8a-5p)

## 2016-12-12 NOTE — Progress Notes (Signed)
[facility] Encounter Note  Patient: Dennis Mullen / Admit Date: 12/07/2016 / Date of Encounter: 12/12/2016, 1:45 PM   Subjective: Patient has significant amount of chest soreness from ACLS protocol and chest compressions but slowly improving over the last few days. Patient has continued EKG changes with diffuse ST depression and T-wave inversions slightly different from previous admission. Cardiac catheterization from previous admission evaluated showing patent stents and no evidence of progression of coronary disease requiring further intervention and ejection fraction 55%. Currently the patient remains in normal sinus rhythm on amiodarone after of V. tach V. fib several days ago and had an episode of atrial fibrillation last night Echo shows apical and distal anteroapical wall hypokinesis and moderate aortic stenosis unchanged from 2017 and 11/27/2016 after review of both and suggests this was from previous infarction at time of lad stenosis and lad stent  Review of Systems: Positive for: Weakness and fatigue and tired Negative for: Vision change, hearing change, syncope, dizziness, nausea, vomiting,diarrhea, bloody stool, stomach pain, cough, congestion, diaphoresis, urinary frequency, urinary pain,skin lesions, skin rashes Others previously listed  Objective: Telemetry: Normal sinus rhythm Physical Exam: Blood pressure 110/85, pulse (!) 58, temperature (!) 97.4 F (36.3 C), temperature source Oral, resp. rate 19, height 6\' 2"  (1.88 m), weight 127 kg (279 lb 15.8 oz), SpO2 99 %. Body mass index is 35.95 kg/m. General: Well developed, well nourished, in no acute distress. Head: Normocephalic, atraumatic, sclera non-icteric, no xanthomas, nares are without discharge. Neck: No apparent masses Lungs: Normal respirations with some wheezes, no rhonchi, no rales , few basilar crackles   Heart: Regular rate and rhythm, normal S1 S2, no murmur, no rub, no gallop, PMI is  normal size and placement, carotid upstroke normal without bruit, jugular venous pressure normal Abdomen: Soft, non-tender, non-distended with normoactive bowel sounds. No hepatosplenomegaly. Abdominal aorta is normal size without bruit Extremities: Trace edema, no clubbing, no cyanosis, no ulcers,  Peripheral: 2+ radial, 2+ femoral, 2+ dorsal pedal pulses Neuro: Alert and oriented. Moves all extremities spontaneously. Psych:  Responds to questions appropriately with a normal affect.   Intake/Output Summary (Last 24 hours) at 12/12/16 1345 Last data filed at 12/12/16 1306  Gross per 24 hour  Intake          1115.01 ml  Output             1900 ml  Net          -784.99 ml    Inpatient Medications:  . allopurinol  100 mg Oral Daily  . amiodarone  400 mg Oral BID  . clopidogrel  75 mg Oral Daily  . divalproex  1,000 mg Oral QHS  . divalproex  500 mg Oral Daily  . DULoxetine  60 mg Oral Daily  . fenofibrate  54 mg Oral Daily  . furosemide  40 mg Oral Daily  . insulin aspart  0-15 Units Subcutaneous TID WC  . insulin aspart  0-5 Units Subcutaneous QHS  . insulin aspart  6 Units Subcutaneous TID WC  . [START ON 12/13/2016] isosorbide mononitrate  30 mg Oral Daily  . levothyroxine  50 mcg Oral QAC breakfast  . metoprolol succinate  50 mg Oral Daily  . pantoprazole  40 mg Oral Daily  . pregabalin  100 mg Oral TID AC  . rOPINIRole  2 mg Oral BID  . rosuvastatin  20 mg Oral QHS  . sodium chloride flush  3 mL Intravenous Q12H  . vitamin C  500  mg Oral Daily   Infusions:  . sodium chloride    . heparin 1,550 Units/hr (12/12/16 0620)    Labs:  Recent Labs  12/10/16 1906 12/12/16 0428  NA 134* 137  K 4.4 4.1  CL 99* 100*  CO2 25 28  GLUCOSE 243* 191*  BUN 27* 27*  CREATININE 1.62* 1.28*  CALCIUM 9.1 9.2  MG 2.0  --    No results for input(s): AST, ALT, ALKPHOS, BILITOT, PROT, ALBUMIN in the last 72 hours.  Recent Labs  12/11/16 0424 12/12/16 0428  WBC 7.5 8.2  HGB  10.0* 10.4*  HCT 29.9* 31.3*  MCV 85.7 85.3  PLT 346 395   No results for input(s): CKTOTAL, CKMB, TROPONINI in the last 72 hours. Invalid input(s): POCBNP No results for input(s): HGBA1C in the last 72 hours.   Weights: Filed Weights   12/07/16 1824 12/07/16 2300 12/08/16 1429  Weight: 129.3 kg (285 lb) 129 kg (284 lb 6.3 oz) 127 kg (279 lb 15.8 oz)     Radiology/Studies:  Dg Chest 2 View  Result Date: 12/07/2016 CLINICAL DATA:  Shortness of breath.  Chest pressure.  Dyspnea. EXAM: CHEST  2 VIEW COMPARISON:  Radiographs and CT 1 week prior 11/30/2016 FINDINGS: Unchanged heart size and mediastinal contours. Worsening pulmonary edema from prior exam. No confluent airspace disease. No pleural fluid. No pneumothorax. No acute osseous abnormality. IMPRESSION: Worsening pulmonary edema from prior exam. Electronically Signed   By: Jeb Levering M.D.   On: 12/07/2016 06:53   Dg Chest 2 View  Result Date: 11/28/2016 CLINICAL DATA:  Chest pain, shortness of breath. EXAM: CHEST  2 VIEW COMPARISON:  Radiographs of November 27, 2016. FINDINGS: The heart size and mediastinal contours are within normal limits. Both lungs are clear. No pneumothorax or pleural effusion is noted. The visualized skeletal structures are unremarkable. IMPRESSION: No active cardiopulmonary disease. Pulmonary edema noted on prior exam has resolved. Electronically Signed   By: Marijo Conception, M.D.   On: 11/28/2016 08:06   Dg Chest 2 View  Result Date: 11/27/2016 CLINICAL DATA:  Chest pain EXAM: CHEST  2 VIEW COMPARISON:  11/03/2016 FINDINGS: Borderline cardiomegaly. There is generalized Kerley lines and fissure thickening. No effusion or pneumothorax. Negative aorta and hila. IMPRESSION: Pulmonary interstitial edema. Electronically Signed   By: Monte Fantasia M.D.   On: 11/27/2016 08:26   Ct Angio Chest Pe W And/or Wo Contrast  Result Date: 11/30/2016 CLINICAL DATA:  Acute onset of shortness of breath and generalized  chest pressure. Initial encounter. EXAM: CT ANGIOGRAPHY CHEST WITH CONTRAST TECHNIQUE: Multidetector CT imaging of the chest was performed using the standard protocol during bolus administration of intravenous contrast. Multiplanar CT image reconstructions and MIPs were obtained to evaluate the vascular anatomy. CONTRAST:  75 mL of Isovue 370 IV contrast COMPARISON:  Chest radiograph performed earlier today at 4:27 a.m., and CTA of the chest performed 10/10/2014 FINDINGS: Cardiovascular: There is no evidence of significant pulmonary embolus. Evaluation for pulmonary embolus is somewhat suboptimal due to motion artifact. The heart remains normal in size. Diffuse coronary artery calcifications are seen. Scattered calcification is seen along the aortic arch. The proximal great vessels are grossly unremarkable in appearance. Mediastinum/Nodes: Mildly enlarged right paratracheal and superior mediastinal nodes measure up to 1.2 cm in short axis. No pericardial effusion is identified. The thyroid gland is unremarkable. No axillary lymphadenopathy is seen. Lungs/Pleura: Diffuse interstitial prominence is noted, with hazy bilateral airspace opacities, compatible with pulmonary edema. No pleural effusion or  pneumothorax is seen. No dominant mass is identified. Upper Abdomen: The visualized portions of the liver and spleen are unremarkable, aside from an apparent calcified granuloma within the spleen. The visualized portions of the gallbladder, pancreas, adrenal glands and stomach are within normal limits. Musculoskeletal: No acute osseous abnormalities are identified. The visualized musculature is unremarkable in appearance. Review of the MIP images confirms the above findings. IMPRESSION: 1. No evidence of significant pulmonary embolus. 2. Diffuse interstitial prominence and hazy bilateral airspace opacities are compatible with pulmonary edema. 3. Diffuse coronary artery calcifications seen. 4. Mildly enlarged mediastinal  nodes measure up to 1.2 cm in short axis. Electronically Signed   By: Garald Balding M.D.   On: 11/30/2016 06:19   Dg Chest Port 1 View  Result Date: 12/10/2016 CLINICAL DATA:  Dyspnea.  Cardiac arrest 2 days ago. EXAM: PORTABLE CHEST 1 VIEW COMPARISON:  12/08/2016 FINDINGS: Heart size remains within normal limits allowing for portable and lordotic positioning. Low lung volumes are noted, however there is no evidence of pulmonary infiltrate or definite pleural effusion. IMPRESSION: No active disease. Electronically Signed   By: Earle Gell M.D.   On: 12/10/2016 09:40   Dg Chest Port 1 View  Result Date: 12/08/2016 CLINICAL DATA:  Status post cardiac arrest EXAM: PORTABLE CHEST 1 VIEW COMPARISON:  12/07/2016 FINDINGS: There is mild bilateral interstitial prominence. There is no focal parenchymal opacity. There is no pleural effusion or pneumothorax. There is stable cardiomegaly. The osseous structures are unremarkable. IMPRESSION: Cardiomegaly with mild pulmonary vascular congestion. Electronically Signed   By: Kathreen Devoid   On: 12/08/2016 14:36   Dg Chest Port 1 View  Result Date: 12/07/2016 CLINICAL DATA:  Patient with sudden onset centralized chest pain. Shortness of breath. EXAM: PORTABLE CHEST 1 VIEW COMPARISON:  Chest radiograph 12/07/2016. FINDINGS: Pacer apparatus overlies the left hemithorax. Stable cardiomegaly. Bilateral perihilar interstitial pulmonary opacities. No pleural effusion or pneumothorax. IMPRESSION: Cardiomegaly with bilateral interstitial opacities favored to represent pulmonary edema, similar to prior. Electronically Signed   By: Lovey Newcomer M.D.   On: 12/07/2016 18:48   Dg Chest Port 1 View  Result Date: 11/30/2016 CLINICAL DATA:  Acute onset of shortness of breath and generalized chest pressure. Initial encounter. EXAM: PORTABLE CHEST 1 VIEW COMPARISON:  Chest radiograph performed 11/28/2016 FINDINGS: The lungs are well-aerated. Vascular congestion is noted. Increased  interstitial markings raise concern for mild interstitial edema. There is no evidence of pleural effusion or pneumothorax. The cardiomediastinal silhouette is borderline normal in size. No acute osseous abnormalities are seen. IMPRESSION: Vascular congestion. Increased interstitial markings raise concern for mild interstitial edema. Electronically Signed   By: Garald Balding M.D.   On: 11/30/2016 05:00     Assessment and Recommendation  66 y.o. male with known coronary artery disease status post multiple previous stents having recurrent episodes of the combined systolic diastolic dysfunction heart failure and new onset atrial fibrillation with rapid ventricular rate causing worsening heart failure ventricular fibrillation and ventricular tachycardia status post ACLS protocol Now in normal rhythm and recovering with a non-ST elevation myocardial infarction with unclear whether this is acute coronary syndrome 1. Continue amiodarone to 400 mg twice per day for maintenance of normal sinus rhythm 2. Continue anticoagulation at this time for further risk reduction in stroke with atrial fibrillation and myocardial infarction and convert to elequis 5 mg twice per day for further risk reduction long-term 3.   beta blocker if patient can tolerate with blood pressure status post myocardial infarction 4. Consider  ace if able for infarction and lv dysfunction 5. Begin ambulation and follow for worsening symptoms and need for med adjustments 6. Continued cardiac rehabilitation and ambulation and follow for improvements and if doing well would discharge to home with outpatient follow-up for further adjustments 7. No further cardiac intervention at this time due to patient's wishes to continue medication management and NO apparent new wall motion changes or suggestion of new large size regional infarction. 8.patient should be on plavix for infarction along with eliquis due to infarction but hold asa Signed, Serafina Royals M.D. FACC

## 2016-12-12 NOTE — Progress Notes (Signed)
[facility] [place] NAME: Dennis Mullen    MR#:  [id number]  DATE OF BIRTH:  Dec 18, 1950  SUBJECTIVE:  CHIEF COMPLAINT:   Chief Complaint  Patient presents with  . Code STEMI   Patient had cardiac arrest with ventricular fibrillation on 12/08/2016. At 5 minutes of CPR.   Feels better On heparin drip  In and out of Afib thru the day  REVIEW OF SYSTEMS:    Review of Systems  Constitutional: Positive for malaise/fatigue. Negative for chills and fever.  HENT: Negative for sore throat.   Eyes: Negative for blurred vision, double vision and pain.  Respiratory: Positive for cough and shortness of breath. Negative for hemoptysis and wheezing.   Cardiovascular: Positive for chest pain. Negative for palpitations, orthopnea and leg swelling.  Gastrointestinal: Negative for abdominal pain, constipation, diarrhea, heartburn, nausea and vomiting.  Genitourinary: Negative for dysuria and hematuria.  Musculoskeletal: Negative for back pain and joint pain.  Skin: Negative for rash.  Neurological: Positive for weakness. Negative for sensory change, speech change, focal weakness and headaches.  Endo/Heme/Allergies: Does not bruise/bleed easily.  Psychiatric/Behavioral: Negative for depression. The patient is not nervous/anxious.     DRUG ALLERGIES:  No Known Allergies  VITALS:  Blood pressure 110/85, pulse (!) 58, temperature (!) 97.4 F (36.3 C), temperature source Oral, resp. rate 19, height 6\' 2"  (1.88 m), weight 127 kg (279 lb 15.8 oz), SpO2 99 %.  PHYSICAL EXAMINATION:   Physical Exam  GENERAL:  66 y.o.-year-old patient lying in the bed   EYES: Pupils equal, round, reactive to light and accommodation. No scleral icterus. Extraocular muscles intact.  HEENT: Head atraumatic, normocephalic. Oropharynx and nasopharynx clear.  NECK:  Supple, no jugular venous distention. No thyroid enlargement, no tenderness.  LUNGS: Normal breath sounds  bilaterally,Decreased air entry CARDIOVASCULAR: S1, S2 normal. No murmurs, rubs, or gallops.  ABDOMEN: Soft, nontender, nondistended. Bowel sounds present. No organomegaly or mass.  EXTREMITIES: No cyanosis, clubbing or edema b/l.    NEUROLOGIC: Cranial nerves II through XII are intact. No focal Motor or sensory deficits b/l.   PSYCHIATRIC: The patient is alert and oriented x 3.  SKIN: No obvious rash, lesion, or ulcer.   LABORATORY PANEL:   CBC  Recent Labs Lab 12/12/16 0428  WBC 8.2  HGB 10.4*  HCT 31.3*  PLT 395   ------------------------------------------------------------------------------------------------------------------ Chemistries   Recent Labs Lab 12/07/16 1836  12/10/16 1906 12/12/16 0428  NA 135  < > 134* 137  K 4.5  < > 4.4 4.1  CL 97*  < > 99* 100*  CO2 24  < > 25 28  GLUCOSE 494*  < > 243* 191*  BUN 32*  < > 27* 27*  CREATININE 1.63*  < > 1.62* 1.28*  CALCIUM 9.6  < > 9.1 9.2  MG  --   < > 2.0  --   AST 31  --   --   --   ALT 17  --   --   --   ALKPHOS 69  --   --   --   BILITOT 0.8  --   --   --   < > = values in this interval not displayed. ------------------------------------------------------------------------------------------------------------------  Cardiac Enzymes  Recent Labs Lab 12/08/16 1419  TROPONINI 12.22*   ------------------------------------------------------------------------------------------------------------------  RADIOLOGY:  No results found.  ASSESSMENT AND PLAN:   * Atrial fibrillation. Amiodarone PO Start eliquis.  * Non-ST elevation MI  Patient had recent  cardiac catheterization with moderate CAD in multiple vessels. Medical management advised. This was unchanged from his catheterization in 02/ 2018. Seen by Dr. Clayborn Bigness in the emergency room  due to concern for ST elevation MI.  Troponin peaked at 12.2. On Plavix, statin, beta blocker. Heparin drip. Echocardiogram shows anterior wall motion abnormalities .  Discussed with Dr. [name] and these changes are similar to prior echocardiogram.  * Ventricular fibrillation with cardiac arrest.Status post resuscitation. On amiodarone  * Atrial fibrillation with rapid ventricular rate. On amiodarone.  * Acute on chronic diastolic congestive heart failure- resolved On Lasix.   * Acute hypoxic respiratory failure due to CHF. Wean oxygen as tolerated. Nebulizers when necessary. Wean oxygen as tolerated  * Diabetes mellitus type 2. Sliding scale insulin.  * CKD stage III. Monitor while on Lasix.  We'll stop heparin drip today. Start Eliquis. Continue telemetry monitoring overnight. Tablet patient. Discharge in the morning if stable. Will need cardiac rehabilitation referral  All the records are reviewed and case discussed with Care Management/Social Worker Management plans discussed with the patient, family and they are in agreement.  CODE STATUS: FULL CODE  DVT Prophylaxis: SCDs  TOTAL TIME TAKING CARE OF THIS PATIENT: 35 minutes  Hillary Bow R M.D on 12/12/2016 at 4:03 PM  Between 7am to 6pm - Pager - [phone]  After 6pm go to [email or web address] - password [email or web address] [facility]  Office  [phone]  CC: Primary care physician; Lavera Guise, MD  Note: This dictation was prepared with Dragon dictation along with smaller phrase technology. Any transcriptional errors that result from this process are unintentional.

## 2016-12-12 NOTE — Care Management Important Message (Signed)
Important Message  Patient Details  Name: Dennis Mullen MRN: [id number] Date of Birth: 07-10-1950   Medicare Important Message Given:  Yes    Marshell Garfinkel, RN 12/12/2016, 8:25 AM

## 2016-12-12 NOTE — Progress Notes (Addendum)
Patient has been referred to Cardiac Rehab with dx of NSTEMI by Dr. [name].   Patient admitted to [facility] with Afib RVR.  Patient has known CAD, s/p multiple previous stents with recurrent episodes of Combined systolic and diastolic heart failure.  This admission patient with afib RVR causing worsening HF and strain on heart with V-fib and V-tach requiring 5 minutes of CPR / ACLS protocol on December 08, 2016.    Patient had episode of Afib last night.  Patient is on Amiodarone orally and is currently in SR. Reviewed patient's diagnosis of Afib RVR, CHF, and NSTEMI with patient. Note:  Patient has had 4 admissions to [facility] since November 03, 2016 - all cardiac related.  With this admission patient did not want another cardiac cath as his last one was November 28, 2016.  Last Echo was performed on December 10, 2016 with EF measured at 45 - 50%.  "Living Better with Heart Failure" booklet given and reviewed with patient.  Reviewed with patient the definition of HF and the meaning of the EF measurement on Echo.  Patient informed this RN that he does weigh himself everyday.  He is supposed to be getting an IPAD provided by his insurance company to monitor his weight and document his symptoms.  I encouraged patient to weigh himself every morning and to assess his symptoms at the same time.  Reviewed the HF zones.  Explained to patient HF is a chronic condition and one that MUST have patient self-management with help from the physicians in order to successfully manage this illness. Instructed patient if his symptoms fall in the Yellow Zone that he must contact his physician or the HF Clinic.  I explained the purpose of the HF Clinic was to serve as an additional resource in managing HF and not to replace any of his physicians.   If your symptoms fall in the RED Zone,  Patient was instructed to call [phone].  Low sodium, low fat, low cholesterol, carb modified heart healthy diet also discussed with patient. Medications used to treat heart  failure were also reviewed with patient.  Patient currently is not on ACE Inhibitor, but may be added later.  Exercise discussed with patient.  Patient started Cardiac Rehab last fall, but was unable to complete the program due to problems with his foot. I explained to patient Dr. [name] had ordered Cardiac Rehab.  He is agreeable to this.  Patient is scheduled to start Cardiac Rehab on Monday, December 25, 2016 at 10:30.  Cardiac Rehab orientation packet given to patient.  Patient stated he did not want Cardiac Rehab to interfere with his biweekly appointments with his therapist, as that appointment is very important to him for the management of his Bipolar Disorder.  We also discussed patient's noncompliance with wearing his CPAP at home which could also be contributing to his HF and cardiac disease.  I suggested to patient if he spends most of his time in the recliner that he move his CPAP next to his recliner to hopefully improve his compliance.  "Bouncing Back from a Heart Attack" booklet given and reviewed with patient.  Modifiable risk factors discussed with patient.    Patient has an appointment in [facility] on 12/15/2016 at 9 a.m.  Will follow-up with patient tomorrow.    Roanna Epley, RN, BSN, [facility] Cardiovascular and Pulmonary Nurse Navigator [phone]

## 2016-12-12 NOTE — Progress Notes (Signed)
ANTICOAGULATION CONSULT NOTE - Consult  Pharmacy Consult for Heparin Indication: chest pain/ACS  No Known Allergies  Patient Measurements: Height: 6' 2"  (188 cm) Weight: 279 lb 15.8 oz (127 kg) IBW/kg (Calculated) : 82.2 Heparin Dosing Weight: 110  Vital Signs: Temp: 97.6 F (36.4 C) (08/28 0346) Temp Source: Oral (08/28 0346) BP: 126/60 (08/28 0346) Pulse Rate: 56 (08/28 0346)  Labs:  Recent Labs  12/10/16 0509  12/10/16 1906 12/11/16 0424 12/12/16 0428  HGB 10.1*  --   --  10.0* 10.4*  HCT 30.4*  --   --  29.9* 31.3*  PLT 344  --   --  346 395  HEPARINUNFRC 0.29*  < > 0.37 0.43 0.34  CREATININE  --   --  1.62*  --  1.28*  < > = values in this interval not displayed.  Estimated Creatinine Clearance: 82.5 mL/min (A) (by C-G formula based on SCr of 1.28 mg/dL (H)).   Medical History: Past Medical History:  Diagnosis Date  . Abnormal levels of other serum enzymes   . Anxiety   . Bipolar disorder ([place])   . BPH (benign prostatic hyperplasia)   . CAD (coronary artery disease)   . CHF (congestive heart failure) ([place])    ?  Marland Kitchen Depression   . Diabetes mellitus, type II ([place])   . Diabetes mellitus, type II, insulin dependent ([place])   . ED (erectile dysfunction)   . GERD (gastroesophageal reflux disease)   . Gout   . History of borderline personality disorder   . Hypertension   . Hypogonadism in male   . Hypothyroidism   . Insomnia   . Mood disorder ([place])   . Neuropathy   . Obesity   . OSA (obstructive sleep apnea)   . PVD (peripheral vascular disease) ([place])   . Thyroid disease     Medications:  Prescriptions Prior to Admission  Medication Sig Dispense Refill Last Dose  . allopurinol (ZYLOPRIM) 100 MG tablet Take 100 mg by mouth daily.    12/06/2016 at 0800  . amLODipine (NORVASC) 5 MG tablet Take 5 mg by mouth at bedtime.    12/06/2016 at 2000  . apixaban (ELIQUIS) 5 MG TABS tablet Take 1 tablet (5 mg total) by mouth 2 (two) times daily. 60 tablet 0  12/06/2016 at 1800  . clopidogrel (PLAVIX) 75 MG tablet Take 75 mg by mouth daily. Reported on 07/26/2015   12/06/2016 at 0800  . colchicine 0.6 MG tablet Take 0.6 mg by mouth 2 (two) times daily as needed (gout flare).    PRN at PRN  . Continuous Blood Gluc Sensor (FREESTYLE LIBRE SENSOR SYSTEM) MISC Use 1 each as directed. Please place prior to office visit as directed   Taking  . diclofenac (VOLTAREN) 75 MG EC tablet Take 75 mg by mouth daily as needed (gout flare).    PRN at PRN  . divalproex (DEPAKOTE ER) 500 MG 24 hr tablet Take 1-2 tablets (500-1,000 mg total) by mouth 2 (two) times daily. Take 588m in morning and 10043min evening 270 tablet 1 12/06/2016 at 2000  . DULoxetine (CYMBALTA) 60 MG capsule Take 1 capsule (60 mg total) by mouth daily. 90 capsule 1 12/06/2016 at 0800  . esomeprazole (NEXIUM) 40 MG capsule Take 40 mg by mouth daily.    12/06/2016 at 0800  . fenofibrate (TRICOR) 145 MG tablet Take 145 mg by mouth daily.    12/06/2016 at 0800  . furosemide (LASIX) 40 MG tablet Take 1 tablet (40  mg total) by mouth daily. 30 tablet 0 12/06/2016 at 0800  . gemfibrozil (LOPID) 600 MG tablet gemfibrozil 600 mg tablet   12/06/2016 at 0800  . glucose 4 GM chewable tablet Chew 4 g by mouth as needed for low blood sugar.    PRN at PRN  . Insulin Disposable Pump (V-GO 30) KIT Inject 1.2 Units/hr into the skin every hour. 66 units/24 hrs (36 available bolus units--12 bolus units available per meals)   12/07/2016 at Unknown time  . insulin lispro (HUMALOG KWIKPEN) 100 UNIT/ML KiwkPen Inject into the skin See admin instructions. Used ONLY as needed if additional bolus units are needed (up to 36 units/24 hrs.)   PRN at PRN  . isosorbide mononitrate (IMDUR) 60 MG 24 hr tablet Take 60 mg by mouth daily.    12/06/2016 at 0800  . JUBLIA 10 % SOLN Apply 1 application topically daily.   12/06/2016 at Unknown time  . levothyroxine (SYNTHROID, LEVOTHROID) 50 MCG tablet Take 50 mcg by mouth daily before breakfast.    12/06/2016 at 0800  . metFORMIN (GLUCOPHAGE) 1000 MG tablet Take 1,000 mg by mouth 2 (two) times daily with a meal. Morning & supper   12/06/2016 at 2000  . metoprolol succinate (TOPROL-XL) 50 MG 24 hr tablet Take 50 mg by mouth daily.    12/06/2016 at 0800  . nitroGLYCERIN (NITROSTAT) 0.4 MG SL tablet Place 0.4 mg under the tongue every 5 (five) minutes x 3 doses as needed for chest pain.    PRN at PRN  . pregabalin (LYRICA) 100 MG capsule Take 100 mg by mouth 3 (three) times daily before meals.    12/06/2016 at 2000  . rOPINIRole (REQUIP) 2 MG tablet Take 2 mg by mouth 2 (two) times daily.    12/06/2016 at 2000  . rosuvastatin (CRESTOR) 20 MG tablet Take 20 mg by mouth at bedtime.    12/06/2016 at 2000  . VICTOZA 18 MG/3ML SOPN Inject 1.8 mg into the skin daily.    12/06/2016 at PRN  . vitamin C (ASCORBIC ACID) 500 MG tablet Take 500 mg by mouth daily.   12/06/2016 at 0800   Scheduled:  . allopurinol  100 mg Oral Daily  . amiodarone  400 mg Oral BID  . clopidogrel  75 mg Oral Daily  . divalproex  1,000 mg Oral QHS  . divalproex  500 mg Oral Daily  . DULoxetine  60 mg Oral Daily  . fenofibrate  54 mg Oral Daily  . furosemide  40 mg Oral Daily  . insulin aspart  0-15 Units Subcutaneous TID WC  . insulin aspart  0-5 Units Subcutaneous QHS  . insulin aspart  6 Units Subcutaneous TID WC  . isosorbide mononitrate  60 mg Oral Daily  . levothyroxine  50 mcg Oral QAC breakfast  . metoprolol succinate  50 mg Oral Daily  . pantoprazole  40 mg Oral Daily  . pregabalin  100 mg Oral TID AC  . rOPINIRole  2 mg Oral BID  . rosuvastatin  20 mg Oral QHS  . sodium chloride flush  3 mL Intravenous Q12H  . vitamin C  500 mg Oral Daily   Infusions:  . sodium chloride    . heparin 1,550 Units/hr (12/11/16 1800)    Assessment: Pharmacy consulted to dose and monitor heparin in this 66 year old male being treated for ACS. Patient takes apixaban as an outpatient. Last dose of apixaban was 1800 on 8/22.  Goal  of Therapy:  Heparin level 0.3-0.7 units/ml Monitor platelets by anticoagulation protocol: Yes   Plan:  8/24 1600 heparin level 0.44. Will continue heparin at current rate of 1350 units/hr. Recheck HL tomorrow AM.   8/24 0200 heparin level 0.76. Decrease rate to 1350 units/hr. Recheck in 6 hours. 8/24 1000 heparin level 0.55. Will continue heparin at current rate of 1350 units/hr. Recheck HL in 6 hours.   [place] Resident  12/12/2016,5:53 AM    8/25 AM heparin level 0.38. Continue current regimen. Recheck heparin level and CBC with tomorrow AM labs.  8/26 AM heparin level 0.29. 1700 units bolus and increase rate to 1550 units/hr.  8/26: Heparin level resulted @0 .33. Will continue current heparin gtt rate. Will recheck Heparin level @ [place], PharmD  8/26 1906 HL therapeutic x 2. Continue current rate. Will recheck HL/CBC daily.  Nathan A. [place], [place].D., BCPS   8/27 AM heparin level 0.43. Continue current regimen. Recheck heparin level and CBC with tomorrow AM labs.  8/28 0500 HL = 0.34.   Will continue this pt on current rate and recheck HL on 8/29 with AM labs.   Kimeka Badour D 12/12/16 5:53 AM

## 2016-12-12 NOTE — Progress Notes (Signed)
Made Dr. Darvin Neighbours aware that patient has converted to afib on the monitor.  No new orders at this time.

## 2016-12-13 LAB — GLUCOSE, CAPILLARY: Glucose-Capillary: 99 mg/dL (ref 65–99)

## 2016-12-13 MED ORDER — AMIODARONE HCL 400 MG PO TABS: 400.0000 mg | ORAL_TABLET | Freq: Every day | ORAL | 0 refills | Status: DC

## 2016-12-13 NOTE — Care Management (Signed)
[facility] referral due to frequent admissions for cardiac issues

## 2016-12-13 NOTE — Progress Notes (Signed)
Pt given discharge instructions with understanding. No questions at this time. Pt iv d/c and monitor d/c. Pt calling son to make aware friend who is at bedside will bring home. Pt wheeled out via volunteer.

## 2016-12-13 NOTE — Progress Notes (Addendum)
Pt has refused CPAP machine tonight states because it's not like his at home.

## 2016-12-13 NOTE — Progress Notes (Signed)
Rounded on patient.  Patient standing at bedside preparing to go home.  Asked patient if he had any questions for me.  He stated, "He did not have any questions at this time.  He verbally stated the three appointments that he has - one for HF Clinic, one for [facility] RN, and Heart Track.  Patient ready for discharge.  Son present in the room at this time.     Roanna Epley, RN, BSN, [place] [facility] Nurse Navigator

## 2016-12-13 NOTE — Plan of Care (Signed)
Problem: Food- and Nutrition-Related Knowledge Deficit (NB-1.1) Goal: Nutrition education Formal process to instruct or train a patient/client in a skill or to impart knowledge to help patients/clients voluntarily manage or modify food choices and eating behavior to maintain or improve health. Outcome: Adequate for Discharge Nutrition Education Note  RD consulted for nutrition education regarding a Heart Healthy diet.   Lipid Panel     Component Value Date/Time   CHOL 183 12/07/2016 1836   CHOL 223 (H) 01/29/2014 0553   TRIG 377 (H) 12/07/2016 1836   TRIG 474 (H) 01/29/2014 0553   HDL 34 (L) 12/07/2016 1836   HDL 34 (L) 01/29/2014 0553   CHOLHDL 5.4 12/07/2016 1836   VLDL 75 (H) 12/07/2016 1836   VLDL SEE COMMENT 01/29/2014 0553   LDLCALC 74 12/07/2016 1836   [place] SEE COMMENT 01/29/2014 0553    RD provided "Heart Healthy Nutrition Therapy" handout from the Academy of Nutrition and Dietetics. Reviewed patient's dietary recall. Provided examples on ways to decrease sodium and fat intake in diet. Discouraged intake of processed foods and use of salt shaker. Encouraged fresh fruits and vegetables as well as whole grain sources of carbohydrates to maximize fiber intake. Teach back method used.  Expect fair compliance.  Body mass index is 36.01 kg/m. Pt meets criteria for obese class II based on current BMI.  Current diet order is heart healthy/carb modified, patient is consuming approximately 100% of meals at this time. Labs and medications reviewed. No further nutrition interventions warranted at this time. RD contact information provided. If additional nutrition issues arise, please re-consult RD.  Dennis Mullen. Dennis Altland, MS, RD LDN Inpatient Clinical Dietitian Pager [phone]

## 2016-12-13 NOTE — Consult Note (Signed)
   [facility] CM Inpatient Consult   12/13/2016  Dennis Mullen [birth year] [id number]   Referral received by inpatient CM for [place] Management services and post hospital discharge follow up related to a diagnosis of Heart Failure. Patient was evaluated for community based chronic disease management services with [facility] care Management Program as a benefit of patient's [employer]. Spoke with patient to explain [place] Management services. Patient familiar with services and stating he will be receiving lots of other services to help him get back on track.  Patient endorses his primary care provider to be Clayborn Bigness MD.  Verbal consent obtained. Patient confirmed address and phone number listed in EMR is correct. Patient will receive post hospital discharge calls and be evaluated for monthly home visits. [place] Management services does not interfere with or replace any services arranged by the inpatient care management team. Made inpatient RNCM aware that [facility] will be following for care management. For additional questions please contact:   Becci Batty RN, [facility] Liaison  [phone]) Business Mobile [phone]) Toll free office

## 2016-12-13 NOTE — Discharge Summary (Signed)
[place] [facility] [place] NAME: Dennis Mullen    MR#:  [id number]  DATE OF BIRTH:  1951/01/04  DATE OF ADMISSION:  12/07/2016 ADMITTING PHYSICIAN: Baxter Hire, MD  DATE OF DISCHARGE: 12/13/2016 12:05 PM  PRIMARY CARE PHYSICIAN: Lavera Guise, MD   ADMISSION DIAGNOSIS:  Atrial fibrillation with rapid ventricular response (HCC) [I48.91] Atrial flutter with rapid ventricular response (HCC) [I48.92] Acute respiratory failure with hypoxia (HCC) [J96.01] Chest pain, unspecified type [R07.9] Congestive heart failure, unspecified HF chronicity, unspecified heart failure type ([place]) [I50.9]  DISCHARGE DIAGNOSIS:  Active Problems:   Atrial fibrillation with rapid ventricular response ([place])   SECONDARY DIAGNOSIS:   Past Medical History:  Diagnosis Date  . Abnormal levels of other serum enzymes   . Anxiety   . Bipolar disorder ([place])   . BPH (benign prostatic hyperplasia)   . CAD (coronary artery disease)   . CHF (congestive heart failure) ([place])    ?  Marland Kitchen Depression   . Diabetes mellitus, type II ([place])   . Diabetes mellitus, type II, insulin dependent ([place])   . ED (erectile dysfunction)   . GERD (gastroesophageal reflux disease)   . Gout   . History of borderline personality disorder   . Hypertension   . Hypogonadism in male   . Hypothyroidism   . Insomnia   . Mood disorder ([place])   . Neuropathy   . Obesity   . OSA (obstructive sleep apnea)   . PVD (peripheral vascular disease) ([place])   . Thyroid disease      ADMITTING HISTORY  Chief Complaint: chest pain HPI: this is a 66 year old male has a history of CHF. He came to the ED earlier with complaints of shortness of breath is found in congestive heart failure. He was diuresed and sent home. He returned several hours later with chest pain and was found to be in rapid A. Fib. He was placed on a Cardizem drip was still didn't control his rate and cardiology saw him and since placed him on  amiodarone drip. Rate is in the low 100s now. He's no longer having chest pain.  HOSPITAL COURSE:   * Atrial fibrillation With rapid ventricular rate Patient was started on amiodarone drip in the ICU. With this his heart rate improved and patient was in normal sinus rhythm. Transitioned to amiodarone orally. Initially patient was on heparin drip for non-ST elevation MI. Will go home on Eliquis.  * Non-ST elevation MI  Patient had recent cardiac catheterization with moderate CAD in multiple vessels. Medical management advised. This was unchanged from his catheterization in 02/ 2018. Seen by Dr. Clayborn Bigness in the emergency room  due to concern for ST elevation MI.  Troponin peaked at 12.2. On Plavix, statin, beta blocker. Heparin drip stopped one day prior to discharge. Echocardiogram shows anterior wall motion abnormalities . Discussed with Dr. [name] and these changes are similar to prior echocardiogram. Cardiac rehabilitation referral made.  * Ventricular fibrillation with cardiac arrest.Status post resuscitation. On amiodarone  * Acute on chronic systolic  congestive heart failure- resolved On Lasix.   * Acute hypoxic respiratory failure due to CHF. Wean oxygen as tolerated. Nebulizers when necessary. Wean oxygen as tolerated  * Diabetes mellitus type 2. Sliding scale insulin.  * CKD stage III. Stable  Patient discharged home in stable condition. His acute hypoxic respiratory failure present on admission has resolved.  Cardiac Discharge   Aspirin prescribed at discharge:  NO( On Eliquis and Plavix)  High Intensity Statin Prescribed? (Lipitor 40-29m or Crestor 20-456m: Yes  Beta Blocker Prescribed: Yes  ADP Receptor Inhibitor Prescribed? (i.e. Plavix etc.-Includes Medically Managed Patients): Yes  ACEI/ARB Prescribed? (If NO document contraindications)  No  Hypotension  Aldosterone Inhibitor Prescribed? No  Was EF assessed during THIS hospitalization? Yes  (YES =  Measured in current episode of care or document plan to evaluate after discharge.)  Was EF < 40% ? No:   Was Cardiac Rehab II ordered? (Includes Medically managed Patients): Yes  Was Smoking Cessation Advice provided?  No: Nonsmoker     CONSULTS OBTAINED:  Treatment Team:  KoCorey SkainsMD  DRUG ALLERGIES:  No Known Allergies  DISCHARGE MEDICATIONS:   Discharge Medication List as of 12/13/2016 11:00 AM    START taking these medications   Details  amiodarone (PACERONE) 400 MG tablet Take 1 tablet (400 mg total) by mouth daily., Starting Wed 12/13/2016, Normal      CONTINUE these medications which have NOT CHANGED   Details  allopurinol (ZYLOPRIM) 100 MG tablet Take 100 mg by mouth daily. , Historical Med    apixaban (ELIQUIS) 5 MG TABS tablet Take 1 tablet (5 mg total) by mouth 2 (two) times daily., Starting Fri 06/02/2016, Normal    clopidogrel (PLAVIX) 75 MG tablet Take 75 mg by mouth daily. Reported on 07/26/2015, Historical Med    colchicine 0.6 MG tablet Take 0.6 mg by mouth 2 (two) times daily as needed (gout flare). , Starting Mon 11/08/2015, Historical Med    Continuous Blood Gluc Sensor (FRCowetaMISC Use 1 each as directed. Please place prior to office visit as directed, Historical Med    diclofenac (VOLTAREN) 75 MG EC tablet Take 75 mg by mouth daily as needed (gout flare). , Starting Thu 12/09/2015, Historical Med    divalproex (DEPAKOTE ER) 500 MG 24 hr tablet Take 1-2 tablets (500-1,000 mg total) by mouth 2 (two) times daily. Take 50035mn morning and 1000m62m evening, Starting Thu 08/24/2016, Normal    DULoxetine (CYMBALTA) 60 MG capsule Take 1 capsule (60 mg total) by mouth daily., Starting Thu 08/24/2016, Normal    esomeprazole (NEXIUM) 40 MG capsule Take 40 mg by mouth daily. , Starting Wed 10/14/2014, Historical Med    fenofibrate (TRICOR) 145 MG tablet Take 145 mg by mouth daily. , Starting Thu 12/23/2015, Until Fri 12/22/2016,  Historical Med    furosemide (LASIX) 40 MG tablet Take 1 tablet (40 mg total) by mouth daily., Starting Thu 12/07/2016, Print    gemfibrozil (LOPID) 600 MG tablet gemfibrozil 600 mg tablet, Historical Med    glucose 4 GM chewable tablet Chew 4 g by mouth as needed for low blood sugar. , Historical Med    Insulin Disposable Pump (V-GO 30) KIT Inject 1.2 Units/hr into the skin every hour. 66 units/24 hrs (36 available bolus units--12 bolus units available per meals), Starting Wed 10/07/2014, Historical Med    insulin lispro (HUMALOG KWIKPEN) 100 UNIT/ML KiwkPen Inject into the skin See admin instructions. Used ONLY as needed if additional bolus units are needed (up to 36 units/24 hrs.), Historical Med    isosorbide mononitrate (IMDUR) 60 MG 24 hr tablet Take 60 mg by mouth daily. , Starting Tue 05/09/2016, Until Wed 05/09/2017, Historical Med    JUBLIA 10 % SOLN Apply 1 application topically daily., Starting Tue 10/31/2016, Historical Med    levothyroxine (SYNTHROID, LEVOTHROID) 50 MCG tablet Take 50 mcg by mouth daily before breakfast., Historical Med  metFORMIN (GLUCOPHAGE) 1000 MG tablet Take 1,000 mg by mouth 2 (two) times daily with a meal. Morning & supper, Historical Med    metoprolol succinate (TOPROL-XL) 50 MG 24 hr tablet Take 50 mg by mouth daily. , Starting Fri 09/11/2014, Historical Med    nitroGLYCERIN (NITROSTAT) 0.4 MG SL tablet Place 0.4 mg under the tongue every 5 (five) minutes x 3 doses as needed for chest pain. , Historical Med    pregabalin (LYRICA) 100 MG capsule Take 100 mg by mouth 3 (three) times daily before meals. , Historical Med    rOPINIRole (REQUIP) 2 MG tablet Take 2 mg by mouth 2 (two) times daily. , Starting Tue 04/27/2015, Historical Med    rosuvastatin (CRESTOR) 20 MG tablet Take 20 mg by mouth at bedtime. , Historical Med    VICTOZA 18 MG/3ML SOPN Inject 1.8 mg into the skin daily. , Starting Mon 09/07/2014, Historical Med    vitamin C (ASCORBIC ACID)  500 MG tablet Take 500 mg by mouth daily., Historical Med      STOP taking these medications     amLODipine (NORVASC) 5 MG tablet         Today   VITAL SIGNS:  Blood pressure (!) 141/62, pulse (!) 57, temperature (!) 97.5 F (36.4 C), temperature source Oral, resp. rate 18, height 6' 2"  (1.88 m), weight 127.2 kg (280 lb 8 oz), SpO2 90 %.  I/O:   Intake/Output Summary (Last 24 hours) at 12/13/16 1346 Last data filed at 12/13/16 1020  Gross per 24 hour  Intake              483 ml  Output             2100 ml  Net            -1617 ml    PHYSICAL EXAMINATION:  Physical Exam  GENERAL:  66 y.o.-year-old patient lying in the bed with no acute distress.  LUNGS: Normal breath sounds bilaterally, no wheezing, rales,rhonchi or crepitation. No use of accessory muscles of respiration.  CARDIOVASCULAR: S1, S2 normal. No murmurs, rubs, or gallops.  ABDOMEN: Soft, non-tender, non-distended. Bowel sounds present. No organomegaly or mass.  NEUROLOGIC: Moves all 4 extremities. PSYCHIATRIC: The patient is alert and oriented x 3.  SKIN: No obvious rash, lesion, or ulcer.   DATA REVIEW:   CBC  Recent Labs Lab 12/12/16 0428  WBC 8.2  HGB 10.4*  HCT 31.3*  PLT 395    Chemistries   Recent Labs Lab 12/07/16 1836  12/10/16 1906 12/12/16 0428  NA 135  < > 134* 137  K 4.5  < > 4.4 4.1  CL 97*  < > 99* 100*  CO2 24  < > 25 28  GLUCOSE 494*  < > 243* 191*  BUN 32*  < > 27* 27*  CREATININE 1.63*  < > 1.62* 1.28*  CALCIUM 9.6  < > 9.1 9.2  MG  --   < > 2.0  --   AST 31  --   --   --   ALT 17  --   --   --   ALKPHOS 69  --   --   --   BILITOT 0.8  --   --   --   < > = values in this interval not displayed.  Cardiac Enzymes  Recent Labs Lab 12/08/16 1419  TROPONINI 12.22*    Microbiology Results  Results for orders placed or performed during the  hospital encounter of 12/07/16  MRSA PCR Screening     Status: None   Collection Time: 12/07/16 10:39 PM  Result Value Ref  Range Status   MRSA by PCR NEGATIVE NEGATIVE Final    Comment:        The GeneXpert MRSA Assay (FDA approved for NASAL specimens only), is one component of a comprehensive MRSA colonization surveillance program. It is not intended to diagnose MRSA infection nor to guide or monitor treatment for MRSA infections.     RADIOLOGY:  No results found.  Follow up with PCP in 1 week.  Management plans discussed with the patient, family and they are in agreement.  CODE STATUS:     Code Status Orders        Start     Ordered   12/07/16 2242  Full code  Continuous     12/07/16 2242    Code Status History    Date Active Date Inactive Code Status Order ID Comments User Context   11/30/2016  8:07 AM 12/02/2016  6:09 PM Full Code [id number]  Saundra Shelling, MD ED   11/27/2016 11:03 AM 11/28/2016  9:35 PM Full Code [id number]  Vaughan Basta, MD Inpatient   11/03/2016  3:31 PM 11/04/2016  8:36 PM Full Code [id number]  Demetrios Loll, MD Inpatient   08/25/2016 11:42 AM 08/25/2016  6:20 PM Full Code [id number]  Sharlotte Alamo, DPM Inpatient   05/31/2016 12:18 PM 06/02/2016  4:31 PM Full Code [id number]  Gladstone Lighter, MD ED   03/12/2016  5:57 PM 03/13/2016  7:52 PM Full Code [id number]  Nicholes Mango, MD Inpatient   02/02/2016  1:37 AM 02/03/2016 10:08 PM Full Code [id number]  Holley Raring, NP ED   10/11/2015 12:44 PM 10/12/2015  5:30 PM Full Code [id number]  Vaughan Basta, MD Inpatient   09/24/2015  3:12 AM 09/25/2015  3:06 PM Full Code [id number]  Saundra Shelling, MD Inpatient   11/14/2014  5:07 AM 11/15/2014  4:53 PM Full Code [id number]  Harrie Foreman, MD ED    Advance Directive Documentation     Most Recent Value  Type of Advance Directive  Healthcare Power of Attorney  Pre-existing out of facility DNR order (yellow form or pink MOST form)  -  "MOST" Form in Place?  -      TOTAL TIME TAKING CARE OF THIS PATIENT ON DAY OF DISCHARGE: more than 30 minutes.   Hillary Bow R M.D  on 12/13/2016 at 1:46 PM  Between 7am to 6pm - Pager - [phone]  After 6pm go to [email or web address] - password [email or web address] [place] [facility]  Office  [phone]  CC: Primary care physician; Lavera Guise, MD  Note: This dictation was prepared with Dragon dictation along with smaller phrase technology. Any transcriptional errors that result from this process are unintentional.

## 2016-12-14 DIAGNOSIS — I10 Essential (primary) hypertension: Secondary | ICD-10-CM | POA: Diagnosis not present

## 2016-12-14 DIAGNOSIS — L578 Other skin changes due to chronic exposure to nonionizing radiation: Secondary | ICD-10-CM | POA: Diagnosis not present

## 2016-12-14 DIAGNOSIS — I4891 Unspecified atrial fibrillation: Secondary | ICD-10-CM | POA: Diagnosis not present

## 2016-12-14 DIAGNOSIS — C44212 Basal cell carcinoma of skin of right ear and external auricular canal: Secondary | ICD-10-CM | POA: Diagnosis not present

## 2016-12-14 DIAGNOSIS — I25118 Atherosclerotic heart disease of native coronary artery with other forms of angina pectoris: Secondary | ICD-10-CM | POA: Diagnosis not present

## 2016-12-14 DIAGNOSIS — R234 Changes in skin texture: Secondary | ICD-10-CM | POA: Diagnosis not present

## 2016-12-14 DIAGNOSIS — L570 Actinic keratosis: Secondary | ICD-10-CM | POA: Diagnosis not present

## 2016-12-14 DIAGNOSIS — R0602 Shortness of breath: Secondary | ICD-10-CM | POA: Diagnosis not present

## 2016-12-14 DIAGNOSIS — D485 Neoplasm of uncertain behavior of skin: Secondary | ICD-10-CM | POA: Diagnosis not present

## 2016-12-14 DIAGNOSIS — E1165 Type 2 diabetes mellitus with hyperglycemia: Secondary | ICD-10-CM | POA: Diagnosis not present

## 2016-12-15 ENCOUNTER — Encounter: Payer: Self-pay | Admitting: *Deleted

## 2016-12-15 ENCOUNTER — Encounter: Payer: Self-pay | Admitting: Family

## 2016-12-15 ENCOUNTER — Ambulatory Visit: Payer: Medicare Other | Admitting: Family

## 2016-12-15 ENCOUNTER — Other Ambulatory Visit: Payer: Self-pay | Admitting: *Deleted

## 2016-12-15 VITALS — BP 143/54 | HR 62 | Resp 18 | Ht 74.0 in | Wt 288.1 lb

## 2016-12-15 DIAGNOSIS — Z9989 Dependence on other enabling machines and devices: Secondary | ICD-10-CM

## 2016-12-15 DIAGNOSIS — I110 Hypertensive heart disease with heart failure: Secondary | ICD-10-CM

## 2016-12-15 DIAGNOSIS — Z79899 Other long term (current) drug therapy: Secondary | ICD-10-CM | POA: Insufficient documentation

## 2016-12-15 DIAGNOSIS — Z801 Family history of malignant neoplasm of trachea, bronchus and lung: Secondary | ICD-10-CM

## 2016-12-15 DIAGNOSIS — Z794 Long term (current) use of insulin: Secondary | ICD-10-CM

## 2016-12-15 DIAGNOSIS — E1151 Type 2 diabetes mellitus with diabetic peripheral angiopathy without gangrene: Secondary | ICD-10-CM | POA: Insufficient documentation

## 2016-12-15 DIAGNOSIS — Z8042 Family history of malignant neoplasm of prostate: Secondary | ICD-10-CM

## 2016-12-15 DIAGNOSIS — I5032 Chronic diastolic (congestive) heart failure: Secondary | ICD-10-CM

## 2016-12-15 DIAGNOSIS — I10 Essential (primary) hypertension: Secondary | ICD-10-CM

## 2016-12-15 DIAGNOSIS — G4733 Obstructive sleep apnea (adult) (pediatric): Secondary | ICD-10-CM

## 2016-12-15 DIAGNOSIS — E119 Type 2 diabetes mellitus without complications: Secondary | ICD-10-CM

## 2016-12-15 DIAGNOSIS — I509 Heart failure, unspecified: Secondary | ICD-10-CM

## 2016-12-15 NOTE — Patient Outreach (Signed)
Successful telephone encounter to Dennis Mullen, 66 year old male, follow up on referral received 12/13/16  from [facility] hospital liaison RN for Community CM services/transition of care- recent hospitalization August 23-29,2018 for Atrial Fib with rapid ventricular response,acute respiratory failure with hypoxia,CHF.  Pt's history includes but not limited to CAD, DM,Bipolar, non STEMI.  Spoke with pt, HIPAA identifiers verified, discussed purpose of call- follow up on referral for transition of care. Pt recognized this RN CM- received [facility] services in the past.  Pt reports being weak, hospitalized  4 times in last 2 weeks, most recent hospital stay- was worse MI, coded. Pt reports saw PCP yesterday, HF clinic today, not been weighing at home/weighed at MD visits, last weight was 284 lbs.  Pt reports to see Heart MD next week.  RN CM discussed with pt importance of weighing, call MD for weight gain >2-3 lbs in a day, 5 lbs in a week to which pt voiced understanding. Pt reports taking all medications except new medication Amiodarone/need to pick up.  **Patient was recently discharged from hospital and all medications have been reviewed.   Plan:  As discussed with pt, plan to continue to follow for transition of care, follow up again next week telephonically.            Barrier letter sent to Dr. Humphrey Rolls.    Zara Chess.   [facility]  [phone]

## 2016-12-15 NOTE — Progress Notes (Signed)
Patient ID: Dennis Mullen, male    DOB: 11-22-1950, 66 y.o.   MRN: [id number]  HPI  Dennis Mullen is a 66 y/o male with a history of anxiety, bipolar, CAD, depression, DM, GERD, gout, HTN, obstructive sleep apnea, PVD and chronic heart failure.   Echo report from 12/10/16 reviewed and shows an EF of 45-50%. This has declined from previous EF of 60-65% on 11/27/16. Cardiac catheterization done 11/28/16 showed an EF of 55% with mild/moderate CAD without significant change from catheterization done February 2018. Medical therapy was recommended.   Admitted 12/07/16 due to AF with RVR. Initially was in ICU on an amiodarone drip and then transitioned to oral amiodarone. Cardiology consult obtained. Developed VF with cardiac arrest status post resuscitation. Cardiac catheterization done and cardiac rehab referral made. Discharged home after 6 days. Was in the ED 12/07/16 due to HF symptoms and he was treated and released. Admitted 11/30/16 due to pulmonary edema. Cardiology consult obtained and medications were adjusted. Discharged home after 2 days. Admitted 11/27/16 due to HF and chest pain. Cardiology consult obtained and catheterization was done. Initially needed IV diuretics and transitioned to oral diuretics. Discharged home the next day. Admitted 11/03/16 due to chest pain. Cardiology consult done and lexiscan stress test was done which showed a low risk. Discharged the next day.   He presents today for his initial visit with a chief complaint of moderate fatigue with little exertion. He describes this as chronic in nature having been present for several years with varying levels of severity. He has associated shortness of breath, intermittent chest pain, edema and weakness. He denies any palpitations, dizziness or difficulty sleeping.   Past Medical History:  Diagnosis Date  . Abnormal levels of other serum enzymes   . Anxiety   . Bipolar disorder ([place])   . BPH (benign prostatic hyperplasia)   . CAD  (coronary artery disease)   . CHF (congestive heart failure) ([place])    ?  Marland Kitchen Depression   . Diabetes mellitus, type II ([place])   . Diabetes mellitus, type II, insulin dependent ([place])   . ED (erectile dysfunction)   . GERD (gastroesophageal reflux disease)   . Gout   . History of borderline personality disorder   . Hypertension   . Hypogonadism in male   . Hypothyroidism   . Insomnia   . Mood disorder ([place])   . Neuropathy   . Obesity   . OSA (obstructive sleep apnea)   . PVD (peripheral vascular disease) ([place])   . Thyroid disease    Past Surgical History:  Procedure Laterality Date  . AMPUTATION TOE Left 08/25/2016   Procedure: AMPUTATION TOE/ipj left 2nd toe;  Surgeon: Sharlotte Alamo, DPM;  Location: [facility];  Service: Podiatry;  Laterality: Left;  . CARDIAC CATHETERIZATION N/A 10/12/2015   Procedure: Left Heart Cath and Coronary Angiography;  Surgeon: Teodoro Spray, MD;  Location: [place] [facility];  Service: Cardiovascular;  Laterality: N/A;  . CORONARY ANGIOPLASTY WITH STENT PLACEMENT     x 6 stents  . FOOT SURGERY Right    fracture repair with screw  . LEFT HEART CATH AND CORONARY ANGIOGRAPHY Right 06/01/2016   Procedure: Left Heart Cath and Coronary Angiography;  Surgeon: Isaias Cowman, MD;  Location: [place] [facility];  Service: Cardiovascular;  Laterality: Right;  . LEFT HEART CATH AND CORONARY ANGIOGRAPHY N/A 11/28/2016   Procedure: LEFT HEART CATH AND CORONARY ANGIOGRAPHY and possible pci;  Surgeon: Yolonda Kida, MD;  Location:  [place] [facility];  Service: Cardiovascular;  Laterality: N/A;  . MASTOIDECTOMY Right   . NASAL SEPTUM SURGERY    . penial inplant    . PENILE PROSTHESIS IMPLANT    . right mastoidectomy    . SCROTOPLASTY    . TONSILLECTOMY AND ADENOIDECTOMY     Family History  Problem Relation Age of Onset  . Lung cancer Mother   . Heart attack Father   . Post-traumatic stress disorder Father   . Anxiety disorder Father   . Depression  Father   . Heart attack Sister   . Heart attack Brother   . Prostate cancer Neg Hx   . Kidney cancer Neg Hx    Social History  Substance Use Topics  . Smoking status: Never Smoker  . Smokeless tobacco: Never Used  . Alcohol use No   No Known Allergies Prior to Admission medications   Medication Sig Start Date End Date Taking? Authorizing Provider  allopurinol (ZYLOPRIM) 100 MG tablet Take 100 mg by mouth daily.    Yes [provider]  apixaban (ELIQUIS) 5 MG TABS tablet Take 1 tablet (5 mg total) by mouth 2 (two) times daily. 06/02/16  Yes Sudini, Dennis Heimlich, MD  clopidogrel (PLAVIX) 75 MG tablet Take 75 mg by mouth daily. Reported on 07/26/2015   Yes [provider]  colchicine 0.6 MG tablet Take 0.6 mg by mouth 2 (two) times daily as needed (gout flare).  11/08/15  Yes [provider]  Continuous Blood Gluc Sensor (FREESTYLE LIBRE SENSOR SYSTEM) MISC Use 1 each as directed. Please place prior to office visit as directed 06/20/16  Yes [provider]  diclofenac (VOLTAREN) 75 MG EC tablet Take 75 mg by mouth daily as needed (gout flare).  12/09/15  Yes [provider]  divalproex (DEPAKOTE ER) 500 MG 24 hr tablet Take 1-2 tablets (500-1,000 mg total) by mouth 2 (two) times daily. Take 531m in morning and 10026min evening 08/24/16  Yes Clapacs, JoMadie RenoMD  DULoxetine (CYMBALTA) 60 MG capsule Take 1 capsule (60 mg total) by mouth daily. 08/24/16  Yes Clapacs, JoMadie RenoMD  esomeprazole (NEXIUM) 40 MG capsule Take 40 mg by mouth daily.  10/14/14  Yes [provider]  fenofibrate (TRICOR) 145 MG tablet Take 145 mg by mouth daily.  12/23/15 12/22/16 Yes [provider]  furosemide (LASIX) 40 MG tablet Take 1 tablet (40 mg total) by mouth daily. 12/07/16  Yes StCarrie MewMD  gemfibrozil (LOPID) 600 MG tablet gemfibrozil 600 mg tablet   Yes [provider]  glucose 4 GM chewable tablet Chew 4 g by mouth as needed for low blood sugar.     Yes [provider]  Insulin Disposable Pump (V-GO 30) KIT Inject 1.2 Units/hr into the skin every hour. 66 units/24 hrs (36 available bolus units--12 bolus units available per meals) 10/07/14  Yes [provider]  insulin lispro (HUMALOG KWIKPEN) 100 UNIT/ML KiwkPen Inject into the skin See admin instructions. Used ONLY as needed if additional bolus units are needed (up to 36 units/24 hrs.)   Yes [provider]  isosorbide mononitrate (IMDUR) 60 MG 24 hr tablet Take 60 mg by mouth daily.  05/09/16 05/09/17 Yes [provider]  JUBLIA 10 % SOLN Apply 1 application topically daily. 10/31/16  Yes [provider]  levothyroxine (SYNTHROID, LEVOTHROID) 50 MCG tablet Take 50 mcg by mouth daily before breakfast.   Yes [provider]  metFORMIN (GLUCOPHAGE) 1000 MG tablet  Take 1,000 mg by mouth 2 (two) times daily with a meal. Morning & supper   Yes [provider]  metoprolol succinate (TOPROL-XL) 50 MG 24 hr tablet Take 50 mg by mouth daily.  09/11/14  Yes [provider]  nitroGLYCERIN (NITROSTAT) 0.4 MG SL tablet Place 0.4 mg under the tongue every 5 (five) minutes x 3 doses as needed for chest pain.    Yes [provider]  pregabalin (LYRICA) 100 MG capsule Take 100 mg by mouth 3 (three) times daily before meals.    Yes [provider]  rOPINIRole (REQUIP) 2 MG tablet Take 2 mg by mouth 2 (two) times daily.  04/27/15  Yes [provider]  rosuvastatin (CRESTOR) 20 MG tablet Take 20 mg by mouth at bedtime.    Yes [provider]  VICTOZA 18 MG/3ML SOPN Inject 1.8 mg into the skin daily.  09/07/14  Yes [provider]  vitamin C (ASCORBIC ACID) 500 MG tablet Take 500 mg by mouth daily.   Yes [provider]  amiodarone (PACERONE) 400 MG tablet Take 1 tablet (400 mg total) by mouth daily. Patient not taking: Reported on 12/15/2016 12/13/16   Dennis Bow, MD   Review of Systems   Constitutional: Positive for fatigue (tire easily). Negative for appetite change.  HENT: Negative for congestion, rhinorrhea and sore throat.   Eyes: Negative.   Respiratory: Positive for shortness of breath. Negative for chest tightness and wheezing.   Cardiovascular: Positive for chest pain and leg swelling. Negative for palpitations.  Gastrointestinal: Negative for abdominal distention and abdominal pain.  Endocrine: Negative.   Genitourinary: Negative.   Musculoskeletal: Negative for back pain and neck pain.  Skin: Negative.   Allergic/Immunologic: Negative.   Neurological: Positive for weakness. Negative for dizziness and light-headedness.  Hematological: Negative for adenopathy. Does not bruise/bleed easily.  Psychiatric/Behavioral: Negative for dysphoric mood and sleep disturbance. The patient is not nervous/anxious.    Vitals:   12/15/16 0915  BP: (!) 143/54  Pulse: 62  Resp: 18  SpO2: 96%  Weight: 288 lb 2 oz (130.7 kg)  Height: 6' 2"  (1.88 m)   Wt Readings from Last 3 Encounters:  12/15/16 288 lb 2 oz (130.7 kg)  12/13/16 280 lb 8 oz (127.2 kg)  12/07/16 285 lb (129.3 kg)   Lab Results  Component Value Date   CREATININE 1.28 (H) 12/12/2016   CREATININE 1.62 (H) 12/10/2016   CREATININE 1.36 (H) 12/09/2016   Physical Exam  Constitutional: He is oriented to person, place, and time. He appears well-developed and well-nourished.  HENT:  Head: Normocephalic and atraumatic.  Neck: Normal range of motion. Neck supple. No JVD present.  Cardiovascular: Regular rhythm.  Bradycardia present.   Pulmonary/Chest: Effort normal. He has no wheezes. He has no rales.  Abdominal: Soft. He exhibits no distension. There is no tenderness.  Musculoskeletal: He exhibits edema (3+ pitting edema in bilateral lower legs). He exhibits no tenderness.  Neurological: He is alert and oriented to person, place, and time.  Skin: Skin is warm and dry.  Psychiatric: He has a normal mood and  affect. His behavior is normal. Thought content normal.  Nursing note and vitals reviewed.   Assessment & Plan:  1: Chronic heart failure with preserved ejection fraction- - NYHA class III - mildly fluid overloaded today - weighing daily and says that his weight has been stable. Discussed the importance of calling for an overnight weight gain of >2 pounds or a weekly weight gain  of >5 pounds - says that he's been on furosemide for a "long time"; discussed changing his diuretic to torsemide but he says that he'd like to speak to his cardiologist first - getting his amiodarone RX picked up today - using "minimal" salt and has been reading food labels. Explained the importance of closely following a 2085m sodium diet - does not meet ReDS vest criteria due to chest size/shape - saw cardiology (Lyndel Pleasure 08/09/16 and returns 12/21/16 - has been wearing lymphapress compression boots for the last 3 months and says that the edema is much better since he's been using them  2: HTN- - BP ok today - BMP from 12/12/16 reviewed and shows a sodium 137, potassium 4.1 and GFR of 58  3: DM- - fasting glucose at home this morning was 338 - he says that it's always high after he's recently been hospitalized because his insulin regimen is altered when he's admitted - encouraged him to call Dr. PEddie Dibblesbut he says that it'll come down after a few days - saw endocrinologist (Eddie Dibbles 11/07/16 - uses a short-acting insulin pump  4: Obstructive sleep apnea- - not wearing CPAP due to falling asleep in the recliner - discussed the importance of wearing his CPAP nightly to help reduce the stress on his heart and hopefully help improve his fatigue, edema and/or shortness of breath  Patient did not bring his medications nor a list. Each medication was verbally reviewed with the patient and he was encouraged to bring the bottles to every visit to confirm accuracy of list.  Return here in 1 month or sooner for any  questions/problems before then.

## 2016-12-15 NOTE — Patient Instructions (Signed)
Continue weighing daily and call for an overnight weight gain of > 2 pounds or a weekly weight gain of >5 pounds. 

## 2016-12-16 ENCOUNTER — Emergency Department: Payer: Medicare Other

## 2016-12-16 ENCOUNTER — Encounter: Payer: Self-pay | Admitting: Emergency Medicine

## 2016-12-16 ENCOUNTER — Inpatient Hospital Stay: Payer: Medicare Other

## 2016-12-16 DIAGNOSIS — Z7189 Other specified counseling: Secondary | ICD-10-CM | POA: Diagnosis not present

## 2016-12-16 DIAGNOSIS — J810 Acute pulmonary edema: Secondary | ICD-10-CM | POA: Diagnosis not present

## 2016-12-16 DIAGNOSIS — Z79899 Other long term (current) drug therapy: Secondary | ICD-10-CM | POA: Diagnosis not present

## 2016-12-16 DIAGNOSIS — R0603 Acute respiratory distress: Secondary | ICD-10-CM | POA: Diagnosis not present

## 2016-12-16 DIAGNOSIS — Z515 Encounter for palliative care: Secondary | ICD-10-CM | POA: Diagnosis not present

## 2016-12-16 DIAGNOSIS — J9601 Acute respiratory failure with hypoxia: Secondary | ICD-10-CM | POA: Diagnosis present

## 2016-12-16 DIAGNOSIS — R0600 Dyspnea, unspecified: Secondary | ICD-10-CM

## 2016-12-16 DIAGNOSIS — Z9911 Dependence on respirator [ventilator] status: Secondary | ICD-10-CM

## 2016-12-16 DIAGNOSIS — Z8249 Family history of ischemic heart disease and other diseases of the circulatory system: Secondary | ICD-10-CM

## 2016-12-16 DIAGNOSIS — Z951 Presence of aortocoronary bypass graft: Secondary | ICD-10-CM

## 2016-12-16 DIAGNOSIS — I428 Other cardiomyopathies: Secondary | ICD-10-CM | POA: Diagnosis not present

## 2016-12-16 DIAGNOSIS — J9690 Respiratory failure, unspecified, unspecified whether with hypoxia or hypercapnia: Secondary | ICD-10-CM

## 2016-12-16 DIAGNOSIS — Z955 Presence of coronary angioplasty implant and graft: Secondary | ICD-10-CM

## 2016-12-16 DIAGNOSIS — F319 Bipolar disorder, unspecified: Secondary | ICD-10-CM | POA: Diagnosis present

## 2016-12-16 DIAGNOSIS — N183 Chronic kidney disease, stage 3 unspecified: Secondary | ICD-10-CM

## 2016-12-16 DIAGNOSIS — J811 Chronic pulmonary edema: Secondary | ICD-10-CM | POA: Diagnosis not present

## 2016-12-16 DIAGNOSIS — I129 Hypertensive chronic kidney disease with stage 1 through stage 4 chronic kidney disease, or unspecified chronic kidney disease: Secondary | ICD-10-CM | POA: Diagnosis not present

## 2016-12-16 DIAGNOSIS — R739 Hyperglycemia, unspecified: Secondary | ICD-10-CM

## 2016-12-16 DIAGNOSIS — Z818 Family history of other mental and behavioral disorders: Secondary | ICD-10-CM

## 2016-12-16 DIAGNOSIS — N179 Acute kidney failure, unspecified: Secondary | ICD-10-CM | POA: Diagnosis not present

## 2016-12-16 DIAGNOSIS — E1165 Type 2 diabetes mellitus with hyperglycemia: Secondary | ICD-10-CM | POA: Diagnosis present

## 2016-12-16 DIAGNOSIS — Z4682 Encounter for fitting and adjustment of non-vascular catheter: Secondary | ICD-10-CM | POA: Diagnosis not present

## 2016-12-16 DIAGNOSIS — I4891 Unspecified atrial fibrillation: Secondary | ICD-10-CM | POA: Diagnosis present

## 2016-12-16 DIAGNOSIS — J9600 Acute respiratory failure, unspecified whether with hypoxia or hypercapnia: Secondary | ICD-10-CM | POA: Diagnosis not present

## 2016-12-16 DIAGNOSIS — Z7902 Long term (current) use of antithrombotics/antiplatelets: Secondary | ICD-10-CM | POA: Diagnosis not present

## 2016-12-16 DIAGNOSIS — E039 Hypothyroidism, unspecified: Secondary | ICD-10-CM | POA: Diagnosis present

## 2016-12-16 DIAGNOSIS — Z7901 Long term (current) use of anticoagulants: Secondary | ICD-10-CM

## 2016-12-16 DIAGNOSIS — I509 Heart failure, unspecified: Secondary | ICD-10-CM | POA: Diagnosis not present

## 2016-12-16 DIAGNOSIS — Z01818 Encounter for other preprocedural examination: Secondary | ICD-10-CM

## 2016-12-16 DIAGNOSIS — I5023 Acute on chronic systolic (congestive) heart failure: Secondary | ICD-10-CM | POA: Diagnosis not present

## 2016-12-16 DIAGNOSIS — E1151 Type 2 diabetes mellitus with diabetic peripheral angiopathy without gangrene: Secondary | ICD-10-CM | POA: Diagnosis present

## 2016-12-16 DIAGNOSIS — N400 Enlarged prostate without lower urinary tract symptoms: Secondary | ICD-10-CM | POA: Diagnosis present

## 2016-12-16 DIAGNOSIS — J9621 Acute and chronic respiratory failure with hypoxia: Secondary | ICD-10-CM | POA: Diagnosis not present

## 2016-12-16 DIAGNOSIS — I2510 Atherosclerotic heart disease of native coronary artery without angina pectoris: Secondary | ICD-10-CM | POA: Diagnosis present

## 2016-12-16 DIAGNOSIS — E872 Acidosis: Secondary | ICD-10-CM | POA: Diagnosis present

## 2016-12-16 DIAGNOSIS — N170 Acute kidney failure with tubular necrosis: Secondary | ICD-10-CM | POA: Diagnosis not present

## 2016-12-16 DIAGNOSIS — Z794 Long term (current) use of insulin: Secondary | ICD-10-CM

## 2016-12-16 DIAGNOSIS — I130 Hypertensive heart and chronic kidney disease with heart failure and stage 1 through stage 4 chronic kidney disease, or unspecified chronic kidney disease: Principal | ICD-10-CM | POA: Diagnosis present

## 2016-12-16 DIAGNOSIS — J90 Pleural effusion, not elsewhere classified: Secondary | ICD-10-CM | POA: Diagnosis not present

## 2016-12-16 DIAGNOSIS — L8990 Pressure ulcer of unspecified site, unspecified stage: Secondary | ICD-10-CM | POA: Insufficient documentation

## 2016-12-16 DIAGNOSIS — A419 Sepsis, unspecified organism: Secondary | ICD-10-CM | POA: Diagnosis not present

## 2016-12-16 DIAGNOSIS — Z66 Do not resuscitate: Secondary | ICD-10-CM | POA: Diagnosis not present

## 2016-12-16 DIAGNOSIS — D631 Anemia in chronic kidney disease: Secondary | ICD-10-CM | POA: Diagnosis present

## 2016-12-16 DIAGNOSIS — Z4659 Encounter for fitting and adjustment of other gastrointestinal appliance and device: Secondary | ICD-10-CM

## 2016-12-16 DIAGNOSIS — E1142 Type 2 diabetes mellitus with diabetic polyneuropathy: Secondary | ICD-10-CM | POA: Diagnosis present

## 2016-12-16 DIAGNOSIS — K219 Gastro-esophageal reflux disease without esophagitis: Secondary | ICD-10-CM | POA: Diagnosis present

## 2016-12-16 DIAGNOSIS — Z801 Family history of malignant neoplasm of trachea, bronchus and lung: Secondary | ICD-10-CM | POA: Diagnosis not present

## 2016-12-16 DIAGNOSIS — Z6837 Body mass index (BMI) 37.0-37.9, adult: Secondary | ICD-10-CM

## 2016-12-16 DIAGNOSIS — E871 Hypo-osmolality and hyponatremia: Secondary | ICD-10-CM | POA: Diagnosis present

## 2016-12-16 DIAGNOSIS — F419 Anxiety disorder, unspecified: Secondary | ICD-10-CM | POA: Diagnosis present

## 2016-12-16 DIAGNOSIS — J9602 Acute respiratory failure with hypercapnia: Secondary | ICD-10-CM | POA: Diagnosis not present

## 2016-12-16 DIAGNOSIS — I214 Non-ST elevation (NSTEMI) myocardial infarction: Secondary | ICD-10-CM | POA: Diagnosis present

## 2016-12-16 DIAGNOSIS — J9811 Atelectasis: Secondary | ICD-10-CM | POA: Diagnosis not present

## 2016-12-16 DIAGNOSIS — Z452 Encounter for adjustment and management of vascular access device: Secondary | ICD-10-CM | POA: Diagnosis not present

## 2016-12-16 DIAGNOSIS — K5900 Constipation, unspecified: Secondary | ICD-10-CM | POA: Diagnosis not present

## 2016-12-16 DIAGNOSIS — G4733 Obstructive sleep apnea (adult) (pediatric): Secondary | ICD-10-CM | POA: Diagnosis present

## 2016-12-16 DIAGNOSIS — I10 Essential (primary) hypertension: Secondary | ICD-10-CM | POA: Diagnosis not present

## 2016-12-16 DIAGNOSIS — Z978 Presence of other specified devices: Secondary | ICD-10-CM

## 2016-12-16 DIAGNOSIS — E1122 Type 2 diabetes mellitus with diabetic chronic kidney disease: Secondary | ICD-10-CM | POA: Diagnosis not present

## 2016-12-16 HISTORY — DX: Ventricular tachycardia: I47.2

## 2016-12-16 HISTORY — DX: Ventricular tachycardia, unspecified: I47.20

## 2016-12-16 LAB — GLUCOSE, CAPILLARY
GLUCOSE-CAPILLARY: 434 mg/dL — AB (ref 65–99)
GLUCOSE-CAPILLARY: 344 mg/dL — AB (ref 65–99)
Glucose-Capillary: 298 mg/dL — ABNORMAL HIGH (ref 65–99)
Glucose-Capillary: 178 mg/dL — ABNORMAL HIGH (ref 65–99)
Glucose-Capillary: 138 mg/dL — ABNORMAL HIGH (ref 65–99)

## 2016-12-16 LAB — BLOOD GAS, ARTERIAL
Acid-base deficit: 11.1 mmol/L — ABNORMAL HIGH (ref 0.0–2.0)
Acid-base deficit: 0.7 mmol/L (ref 0.0–2.0)
Bicarbonate: 18.4 mmol/L — ABNORMAL LOW (ref 20.0–28.0)
Bicarbonate: 27.1 mmol/L (ref 20.0–28.0)
DELIVERY SYSTEMS: POSITIVE
Expiratory PAP: 6
FIO2: 0.5
FIO2: 1
Inspiratory PAP: 12
LHR: 18 {breaths}/min
MECHVT: 500 mL
O2 Saturation: 86.7 %
O2 Saturation: 96.4 %
PATIENT TEMPERATURE: 37
PCO2 ART: 58 mmHg — AB (ref 32.0–48.0)
PEEP: 10 cmH2O
PO2 ART: 71 mmHg — AB (ref 83.0–108.0)
PO2 ART: 96 mmHg (ref 83.0–108.0)
Patient temperature: 37
RATE: 12 resp/min
pCO2 arterial: 59 mmHg — ABNORMAL HIGH (ref 32.0–48.0)
pH, Arterial: 7.11 — CL (ref 7.350–7.450)
pH, Arterial: 7.27 — ABNORMAL LOW (ref 7.350–7.450)

## 2016-12-16 LAB — BASIC METABOLIC PANEL
Anion gap: 10 (ref 5–15)
Anion gap: 10 (ref 5–15)
BUN: 51 mg/dL — AB (ref 6–20)
BUN: 46 mg/dL — AB (ref 6–20)
CALCIUM: 8.8 mg/dL — AB (ref 8.9–10.3)
CHLORIDE: 100 mmol/L — AB (ref 101–111)
CHLORIDE: 100 mmol/L — AB (ref 101–111)
CO2: 25 mmol/L (ref 22–32)
CO2: 26 mmol/L (ref 22–32)
Calcium: 8.5 mg/dL — ABNORMAL LOW (ref 8.9–10.3)
Creatinine, Ser: 2.16 mg/dL — ABNORMAL HIGH (ref 0.61–1.24)
Creatinine, Ser: 1.94 mg/dL — ABNORMAL HIGH (ref 0.61–1.24)
GFR calc Af Amer: 40 mL/min — ABNORMAL LOW (ref >60)
GFR calc non Af Amer: 31 mL/min — ABNORMAL LOW (ref >60)
GFR calc non Af Amer: 35 mL/min — ABNORMAL LOW (ref >60)
GFR, EST AFRICAN AMERICAN: 35 mL/min — AB (ref >60)
GLUCOSE: 199 mg/dL — AB (ref 65–99)
GLUCOSE: 133 mg/dL — AB (ref 65–99)
Potassium: 4.6 mmol/L (ref 3.5–5.1)
Potassium: 4.4 mmol/L (ref 3.5–5.1)
Sodium: 135 mmol/L (ref 135–145)
Sodium: 136 mmol/L (ref 135–145)

## 2016-12-16 LAB — TROPONIN I
TROPONIN I: 1.01 ng/mL — AB (ref <0.03)
TROPONIN I: 1.2 ng/mL — AB (ref <0.03)
Troponin I: 0.41 ng/mL (ref <0.03)
Troponin I: 0.46 ng/mL (ref <0.03)
Troponin I: 1.25 ng/mL (ref <0.03)

## 2016-12-16 LAB — COMPREHENSIVE METABOLIC PANEL
ALBUMIN: 3.4 g/dL — AB (ref 3.5–5.0)
ALK PHOS: 87 U/L (ref 38–126)
ALT: 16 U/L — ABNORMAL LOW (ref 17–63)
ANION GAP: 12 (ref 5–15)
AST: 28 U/L (ref 15–41)
BUN: 47 mg/dL — ABNORMAL HIGH (ref 6–20)
CALCIUM: 9.2 mg/dL (ref 8.9–10.3)
CO2: 23 mmol/L (ref 22–32)
Chloride: 96 mmol/L — ABNORMAL LOW (ref 101–111)
Creatinine, Ser: 2.13 mg/dL — ABNORMAL HIGH (ref 0.61–1.24)
GFR calc non Af Amer: 31 mL/min — ABNORMAL LOW (ref >60)
GFR, EST AFRICAN AMERICAN: 36 mL/min — AB (ref >60)
GLUCOSE: 523 mg/dL — AB (ref 65–99)
POTASSIUM: 4.4 mmol/L (ref 3.5–5.1)
SODIUM: 131 mmol/L — AB (ref 135–145)
TOTAL PROTEIN: 8.2 g/dL — AB (ref 6.5–8.1)
Total Bilirubin: 0.8 mg/dL (ref 0.3–1.2)

## 2016-12-16 LAB — CBC
HCT: 33.1 % — ABNORMAL LOW (ref 40.0–52.0)
Hemoglobin: 10.4 g/dL — ABNORMAL LOW (ref 13.0–18.0)
MCH: 28.8 pg (ref 26.0–34.0)
MCHC: 31.5 g/dL — AB (ref 32.0–36.0)
MCV: 91.5 fL (ref 80.0–100.0)
PLATELETS: 560 10*3/uL — AB (ref 150–440)
RBC: 3.62 MIL/uL — ABNORMAL LOW (ref 4.40–5.90)
RDW: 19.3 % — AB (ref 11.5–14.5)
WBC: 19.3 10*3/uL — ABNORMAL HIGH (ref 3.8–10.6)

## 2016-12-16 LAB — LACTIC ACID, PLASMA
LACTIC ACID, VENOUS: 2.4 mmol/L — AB (ref 0.5–1.9)
Lactic Acid, Venous: 6.8 mmol/L (ref 0.5–1.9)
Lactic Acid, Venous: 2.7 mmol/L (ref 0.5–1.9)
Lactic Acid, Venous: 1.4 mmol/L (ref 0.5–1.9)

## 2016-12-16 LAB — PROTIME-INR
INR: 1.24
PROTHROMBIN TIME: 15.5 s — AB (ref 11.4–15.2)

## 2016-12-16 LAB — MAGNESIUM: Magnesium: 2.1 mg/dL (ref 1.7–2.4)

## 2016-12-16 LAB — TRIGLYCERIDES: TRIGLYCERIDES: 284 mg/dL — AB (ref <150)

## 2016-12-16 LAB — PHOSPHORUS: PHOSPHORUS: 5.9 mg/dL — AB (ref 2.5–4.6)

## 2016-12-16 LAB — APTT: aPTT: 38 seconds — ABNORMAL HIGH (ref 24–36)

## 2016-12-16 LAB — BRAIN NATRIURETIC PEPTIDE: B Natriuretic Peptide: 2377 pg/mL — ABNORMAL HIGH (ref 0.0–100.0)

## 2016-12-16 MED ORDER — ENALAPRILAT 1.25 MG/ML IV SOLN
INTRAVENOUS | Status: AC
  Administered 2016-12-16: 0.625 mg via INTRAVENOUS
  Filled 2016-12-16: qty 2

## 2016-12-16 MED ORDER — NITROGLYCERIN 2 % TD OINT
TOPICAL_OINTMENT | TRANSDERMAL | Status: AC
  Administered 2016-12-16: 1 [in_us] via TOPICAL
  Filled 2016-12-16: qty 1

## 2016-12-16 MED ORDER — METOPROLOL TARTRATE 25 MG PO TABS
25.0000 mg | ORAL_TABLET | Freq: Two times a day (BID) | ORAL | Status: DC
  Administered 2016-12-16 – 2016-12-25 (×17): 25 mg via NASOGASTRIC
  Filled 2016-12-16 (×21): qty 1

## 2016-12-16 MED ORDER — LEVOTHYROXINE SODIUM 50 MCG PO TABS
50.0000 ug | ORAL_TABLET | Freq: Every day | ORAL | Status: DC
  Administered 2016-12-16 – 2016-12-25 (×10): 50 ug via NASOGASTRIC
  Filled 2016-12-16 (×10): qty 1

## 2016-12-16 MED ORDER — PANTOPRAZOLE SODIUM 40 MG IV SOLR
40.0000 mg | INTRAVENOUS | Status: DC
  Administered 2016-12-16 – 2016-12-20 (×5): 40 mg via INTRAVENOUS
  Filled 2016-12-16 (×6): qty 40

## 2016-12-16 MED ORDER — PROPOFOL 1000 MG/100ML IV EMUL
INTRAVENOUS | Status: AC
  Filled 2016-12-16: qty 100

## 2016-12-16 MED ORDER — SODIUM CHLORIDE 0.9 % IV BOLUS (SEPSIS)
500.0000 mL | Freq: Once | INTRAVENOUS | Status: AC
  Administered 2016-12-16: 500 mL via INTRAVENOUS

## 2016-12-16 MED ORDER — PROPOFOL 1000 MG/100ML IV EMUL
5.0000 ug/kg/min | INTRAVENOUS | Status: DC
  Administered 2016-12-16: 5 ug/kg/min via INTRAVENOUS
  Administered 2016-12-16: 25 ug/kg/min via INTRAVENOUS
  Administered 2016-12-17 (×2): 15 ug/kg/min via INTRAVENOUS
  Administered 2016-12-18 (×4): 20 ug/kg/min via INTRAVENOUS
  Administered 2016-12-18: 25 ug/kg/min via INTRAVENOUS
  Administered 2016-12-19: 15 ug/kg/min via INTRAVENOUS
  Administered 2016-12-19: 20 ug/kg/min via INTRAVENOUS
  Administered 2016-12-20: 15 ug/kg/min via INTRAVENOUS
  Administered 2016-12-20 (×2): 20 ug/kg/min via INTRAVENOUS
  Administered 2016-12-21: 15 ug/kg/min via INTRAVENOUS
  Administered 2016-12-21: 20 ug/kg/min via INTRAVENOUS
  Administered 2016-12-21 – 2016-12-22 (×3): 30 ug/kg/min via INTRAVENOUS
  Administered 2016-12-22: 20 ug/kg/min via INTRAVENOUS
  Administered 2016-12-22: 10 ug/kg/min via INTRAVENOUS
  Administered 2016-12-23: 15 ug/kg/min via INTRAVENOUS
  Administered 2016-12-24 (×3): 30 ug/kg/min via INTRAVENOUS
  Administered 2016-12-24: 45 ug/kg/min via INTRAVENOUS
  Administered 2016-12-25 (×2): 30 ug/kg/min via INTRAVENOUS
  Administered 2016-12-25: 10 ug/kg/min via INTRAVENOUS
  Administered 2016-12-25: 25 ug/kg/min via INTRAVENOUS
  Administered 2016-12-25: 30 ug/kg/min via INTRAVENOUS
  Administered 2016-12-26: 30.038 ug/kg/min via INTRAVENOUS
  Filled 2016-12-16 (×35): qty 100

## 2016-12-16 MED ORDER — FENTANYL CITRATE (PF) 100 MCG/2ML IJ SOLN: 50.0000 ug | Freq: Once | INTRAMUSCULAR | Status: DC

## 2016-12-16 MED ORDER — INSULIN ASPART 100 UNIT/ML IV SOLN
10.0000 [IU] | Freq: Once | INTRAVENOUS | Status: AC
  Administered 2016-12-16: 10 [IU] via INTRAVENOUS
  Filled 2016-12-16: qty 0.1

## 2016-12-16 MED ORDER — FUROSEMIDE 10 MG/ML IJ SOLN
40.0000 mg | Freq: Two times a day (BID) | INTRAMUSCULAR | Status: DC
  Administered 2016-12-16 – 2016-12-17 (×3): 40 mg via INTRAVENOUS
  Filled 2016-12-16 (×3): qty 4

## 2016-12-16 MED ORDER — LORAZEPAM 2 MG/ML IJ SOLN
2.0000 mg | Freq: Once | INTRAMUSCULAR | Status: AC
  Administered 2016-12-16: 2 mg via INTRAVENOUS

## 2016-12-16 MED ORDER — PROPOFOL 10 MG/ML IV BOLUS
20.0000 mg | Freq: Once | INTRAVENOUS | Status: AC
  Administered 2016-12-16: 20 mg via INTRAVENOUS

## 2016-12-16 MED ORDER — NITROGLYCERIN 2 % TD OINT
1.0000 [in_us] | TOPICAL_OINTMENT | Freq: Once | TRANSDERMAL | Status: AC
  Administered 2016-12-16: 1 [in_us] via TOPICAL

## 2016-12-16 MED ORDER — ENOXAPARIN SODIUM 150 MG/ML ~~LOC~~ SOLN
1.0000 mg/kg | Freq: Two times a day (BID) | SUBCUTANEOUS | Status: DC
  Administered 2016-12-16 – 2016-12-25 (×20): 130 mg via SUBCUTANEOUS
  Filled 2016-12-16 (×21): qty 0.88

## 2016-12-16 MED ORDER — ROSUVASTATIN CALCIUM 10 MG PO TABS
20.0000 mg | ORAL_TABLET | Freq: Every day | ORAL | Status: DC
  Administered 2016-12-16 – 2016-12-25 (×10): 20 mg
  Filled 2016-12-16 (×10): qty 2

## 2016-12-16 MED ORDER — DOCUSATE SODIUM 100 MG PO CAPS: 100.0000 mg | ORAL_CAPSULE | Freq: Two times a day (BID) | ORAL | Status: DC | PRN

## 2016-12-16 MED ORDER — INSULIN ASPART 100 UNIT/ML ~~LOC~~ SOLN
SUBCUTANEOUS | Status: AC
  Administered 2016-12-16: 10 [IU] via INTRAVENOUS
  Filled 2016-12-16: qty 1

## 2016-12-16 MED ORDER — NITROGLYCERIN IN D5W 200-5 MCG/ML-% IV SOLN
0.0000 ug/min | Freq: Once | INTRAVENOUS | Status: AC
  Administered 2016-12-16: 5 ug/min via INTRAVENOUS
  Filled 2016-12-16: qty 250

## 2016-12-16 MED ORDER — LORAZEPAM 2 MG/ML IJ SOLN
INTRAMUSCULAR | Status: AC
  Administered 2016-12-16: 2 mg via INTRAVENOUS
  Filled 2016-12-16: qty 1

## 2016-12-16 MED ORDER — ETOMIDATE 2 MG/ML IV SOLN
20.0000 mg | Freq: Once | INTRAVENOUS | Status: AC
  Administered 2016-12-16: 20 mg via INTRAVENOUS

## 2016-12-16 MED ORDER — PROPOFOL 10 MG/ML IV BOLUS
30.0000 mg | Freq: Once | INTRAVENOUS | Status: AC
  Administered 2016-12-16: 30 mg via INTRAVENOUS

## 2016-12-16 MED ORDER — FUROSEMIDE 10 MG/ML IJ SOLN
80.0000 mg | Freq: Once | INTRAMUSCULAR | Status: AC
  Administered 2016-12-16: 80 mg via INTRAVENOUS

## 2016-12-16 MED ORDER — ENALAPRILAT 1.25 MG/ML IV SOLN
0.6250 mg | Freq: Once | INTRAVENOUS | Status: AC
  Administered 2016-12-16: 0.625 mg via INTRAVENOUS

## 2016-12-16 MED ORDER — PIPERACILLIN-TAZOBACTAM 3.375 G IVPB
3.3750 g | Freq: Three times a day (TID) | INTRAVENOUS | Status: DC
  Administered 2016-12-16 – 2016-12-19 (×9): 3.375 g via INTRAVENOUS
  Filled 2016-12-16 (×10): qty 50

## 2016-12-16 MED ORDER — FUROSEMIDE 10 MG/ML IJ SOLN
INTRAMUSCULAR | Status: AC
  Administered 2016-12-16: 80 mg via INTRAVENOUS
  Filled 2016-12-16: qty 10

## 2016-12-16 MED ORDER — INSULIN ASPART 100 UNIT/ML ~~LOC~~ SOLN
0.0000 [IU] | SUBCUTANEOUS | Status: DC
  Administered 2016-12-16: 8 [IU] via SUBCUTANEOUS
  Administered 2016-12-16: 3 [IU] via SUBCUTANEOUS
  Administered 2016-12-16 – 2016-12-17 (×4): 2 [IU] via SUBCUTANEOUS
  Administered 2016-12-17: 3 [IU] via SUBCUTANEOUS
  Administered 2016-12-18: 2 [IU] via SUBCUTANEOUS
  Administered 2016-12-18 (×2): 5 [IU] via SUBCUTANEOUS
  Administered 2016-12-18: 2 [IU] via SUBCUTANEOUS
  Administered 2016-12-19 (×2): 5 [IU] via SUBCUTANEOUS
  Administered 2016-12-19: 3 [IU] via SUBCUTANEOUS
  Administered 2016-12-19: 5 [IU] via SUBCUTANEOUS
  Administered 2016-12-19: 3 [IU] via SUBCUTANEOUS
  Administered 2016-12-19: 5 [IU] via SUBCUTANEOUS
  Filled 2016-12-16 (×18): qty 1

## 2016-12-16 MED ORDER — ENALAPRILAT 1.25 MG/ML IV SOLN
0.6250 mg | Freq: Once | INTRAVENOUS | Status: AC
  Administered 2016-12-16: 0.625 mg via INTRAVENOUS
  Filled 2016-12-16: qty 2

## 2016-12-16 MED ORDER — VANCOMYCIN HCL 10 G IV SOLR
1750.0000 mg | Freq: Once | INTRAVENOUS | Status: AC
  Administered 2016-12-16: 1750 mg via INTRAVENOUS
  Filled 2016-12-16: qty 1750

## 2016-12-16 MED ORDER — CHLORHEXIDINE GLUCONATE 0.12% ORAL RINSE (MEDLINE KIT)
15.0000 mL | Freq: Two times a day (BID) | OROMUCOSAL | Status: DC
  Administered 2016-12-16 – 2016-12-25 (×19): 15 mL via OROMUCOSAL

## 2016-12-16 MED ORDER — CLOPIDOGREL BISULFATE 75 MG PO TABS
75.0000 mg | ORAL_TABLET | Freq: Every day | ORAL | Status: DC
  Administered 2016-12-16 – 2016-12-25 (×10): 75 mg via NASOGASTRIC
  Filled 2016-12-16 (×12): qty 1

## 2016-12-16 MED ORDER — ORAL CARE MOUTH RINSE
15.0000 mL | OROMUCOSAL | Status: DC
  Administered 2016-12-16 – 2016-12-26 (×91): 15 mL via OROMUCOSAL

## 2016-12-16 MED ORDER — FENTANYL 2500MCG IN NS 250ML (10MCG/ML) PREMIX INFUSION
25.0000 ug/h | INTRAVENOUS | Status: DC
  Administered 2016-12-16: 50 ug/h via INTRAVENOUS
  Administered 2016-12-17: 75 ug/h via INTRAVENOUS
  Administered 2016-12-18 (×2): 100 ug/h via INTRAVENOUS
  Administered 2016-12-18: 50 ug/h via INTRAVENOUS
  Administered 2016-12-20 – 2016-12-21 (×2): 75 ug/h via INTRAVENOUS
  Administered 2016-12-22: 150 ug/h via INTRAVENOUS
  Administered 2016-12-23 (×3): 350 ug/h via INTRAVENOUS
  Administered 2016-12-24: 200 ug/h via INTRAVENOUS
  Administered 2016-12-24: 350 ug/h via INTRAVENOUS
  Administered 2016-12-25: 150 ug/h via INTRAVENOUS
  Filled 2016-12-16 (×13): qty 250

## 2016-12-16 MED ORDER — PROPOFOL 1000 MG/100ML IV EMUL
5.0000 ug/kg/min | Freq: Once | INTRAVENOUS | Status: AC
  Administered 2016-12-16: 5 ug/kg/min via INTRAVENOUS

## 2016-12-16 MED ORDER — VALPROATE SODIUM 500 MG/5ML IV SOLN
500.0000 mg | Freq: Two times a day (BID) | INTRAVENOUS | Status: DC
  Administered 2016-12-16 – 2016-12-24 (×18): 500 mg via INTRAVENOUS
  Filled 2016-12-16 (×20): qty 5

## 2016-12-16 MED ORDER — FENTANYL BOLUS VIA INFUSION
50.0000 ug | INTRAVENOUS | Status: DC | PRN
  Administered 2016-12-17 – 2016-12-23 (×15): 50 ug via INTRAVENOUS
  Filled 2016-12-16: qty 50

## 2016-12-16 MED ORDER — SUCCINYLCHOLINE CHLORIDE 20 MG/ML IJ SOLN
120.0000 mg | Freq: Once | INTRAMUSCULAR | Status: AC
  Administered 2016-12-16: 120 mg via INTRAVENOUS

## 2016-12-16 MED ORDER — PROPOFOL 1000 MG/100ML IV EMUL
INTRAVENOUS | Status: AC
  Administered 2016-12-16: 5 ug/kg/min via INTRAVENOUS
  Filled 2016-12-16: qty 100

## 2016-12-16 NOTE — Consult Note (Signed)
[facility] CONSULT NOTE    Date: 01/13/2017                  Patient Name:  Dennis Mullen  MRN: [id number]  DOB: 1950-08-16  Age / Sex: 66 y.o., male         PCP: Lavera Guise, MD                 Service Requesting Consult: Hospitalist                 Reason for Consult: Acute renal failure            History of Present Illness: Patient is a 66 y.o. male with a PMHx of Anxiety, bipolar disorder, BPH, coronary artery disease, depression, diabetes mellitus type 2, erectile dysfunction, GERD, gout, hypertension, hypothyroidism, peripheral neuropathy, obesity, chronic kidney disease stage III who was admitted to [facility] on 12/22/2016 for evaluation of chest pain. Upon evaluation the patient was found to be in atrial fibrillation versus flutter. He is on an amiodarone drip. He also has acute respiratory failure and is currently maintained on the ventilator.  We are asked to see the patient for acute renal failure. On 12/12/2016 his creatinine was 1.28. Today his creatinine is up to 2.1 with a BUN of 47. His BNP is also quite elevated at 2377.  He also appears to have lactic acidosis at this time.   Medications: Outpatient medications: Prescriptions Prior to Admission  Medication Sig Dispense Refill Last Dose  . allopurinol (ZYLOPRIM) 100 MG tablet Take 100 mg by mouth daily.    12/15/2016 at Unknown time  . amiodarone (PACERONE) 400 MG tablet Take 1 tablet (400 mg total) by mouth daily. 30 tablet 0 Past Week at Unknown time  . apixaban (ELIQUIS) 5 MG TABS tablet Take 1 tablet (5 mg total) by mouth 2 (two) times daily. 60 tablet 0 12/15/2016 at Unknown time  . clopidogrel (PLAVIX) 75 MG tablet Take 75 mg by mouth daily. Reported on 07/26/2015   12/15/2016 at Unknown time  . colchicine 0.6 MG tablet Take 0.6 mg by mouth 2 (two) times daily as needed (gout flare).    PRN at PRN  . Continuous Blood Gluc Sensor (FREESTYLE LIBRE SENSOR SYSTEM) MISC Use 1 each as directed. Please  place prior to office visit as directed   Taking  . diclofenac (VOLTAREN) 75 MG EC tablet Take 75 mg by mouth daily as needed (gout flare).    PRN at PRN  . divalproex (DEPAKOTE ER) 500 MG 24 hr tablet Take 1-2 tablets (500-1,000 mg total) by mouth 2 (two) times daily. Take 558m in morning and 10029min evening 270 tablet 1 12/15/2016 at Unknown time  . DULoxetine (CYMBALTA) 60 MG capsule Take 1 capsule (60 mg total) by mouth daily. 90 capsule 1 12/15/2016 at Unknown time  . esomeprazole (NEXIUM) 40 MG capsule Take 40 mg by mouth daily.    12/15/2016 at Unknown time  . fenofibrate (TRICOR) 145 MG tablet Take 145 mg by mouth daily.    12/15/2016 at Unknown time  . furosemide (LASIX) 40 MG tablet Take 1 tablet (40 mg total) by mouth daily. 30 tablet 0 12/15/2016 at Unknown time  . gemfibrozil (LOPID) 600 MG tablet gemfibrozil 600 mg tablet   12/15/2016 at Unknown time  . glucose 4 GM chewable tablet Chew 4 g by mouth as needed for low blood sugar.    PRN at PRN  . Insulin Disposable  Pump (V-GO 30) KIT Inject 1.2 Units/hr into the skin every hour. 66 units/24 hrs (36 available bolus units--12 bolus units available per meals)   CONTINOUS at CONTINOUS  . insulin lispro (HUMALOG KWIKPEN) 100 UNIT/ML KiwkPen Inject into the skin See admin instructions. Used ONLY as needed if additional bolus units are needed (up to 36 units/24 hrs.)   PRN at PRN  . isosorbide mononitrate (IMDUR) 60 MG 24 hr tablet Take 60 mg by mouth daily.    12/15/2016 at Unknown time  . JUBLIA 10 % SOLN Apply 1 application topically daily.   Past Week at Unknown time  . levothyroxine (SYNTHROID, LEVOTHROID) 50 MCG tablet Take 50 mcg by mouth daily before breakfast.   12/15/2016 at Unknown time  . metFORMIN (GLUCOPHAGE) 1000 MG tablet Take 1,000 mg by mouth 2 (two) times daily with a meal. Morning & supper   12/15/2016 at Unknown time  . metoprolol succinate (TOPROL-XL) 50 MG 24 hr tablet Take 50 mg by mouth daily.    12/15/2016 at Unknown time   . nitroGLYCERIN (NITROSTAT) 0.4 MG SL tablet Place 0.4 mg under the tongue every 5 (five) minutes x 3 doses as needed for chest pain.    PRN at PRN  . pregabalin (LYRICA) 100 MG capsule Take 100 mg by mouth 3 (three) times daily before meals.    12/15/2016 at Unknown time  . rOPINIRole (REQUIP) 2 MG tablet Take 2 mg by mouth 2 (two) times daily.    12/15/2016 at Unknown time  . rosuvastatin (CRESTOR) 20 MG tablet Take 20 mg by mouth at bedtime.    12/15/2016 at Unknown time  . VICTOZA 18 MG/3ML SOPN Inject 1.8 mg into the skin daily.    Past Week at Unknown time  . vitamin C (ASCORBIC ACID) 500 MG tablet Take 500 mg by mouth daily.   12/15/2016 at Unknown time    Current medications: Current Facility-Administered Medications  Medication Dose Route Frequency Provider Last Rate Last Dose  . clopidogrel (PLAVIX) tablet 75 mg  75 mg Per NG tube Daily Vaughan Basta, MD      . docusate sodium (COLACE) capsule 100 mg  100 mg Oral BID PRN Vaughan Basta, MD      . enoxaparin (LOVENOX) injection 130 mg  1 mg/kg Subcutaneous Q12H Marcelle Overlie D, RPH   130 mg at 12/25/2016 1316  . fentaNYL (SUBLIMAZE) bolus via infusion 50 mcg  50 mcg Intravenous Q1H PRN Dimas Chyle, MD      . fentaNYL (SUBLIMAZE) injection 50 mcg  50 mcg Intravenous Once Dimas Chyle, MD      . fentaNYL 2519mg in NS 2535m(1065mml) infusion-PREMIX  25-400 mcg/hr Intravenous Continuous GupDimas ChyleD 5 mL/hr at 01/02/2017 1309 50 mcg/hr at 01/08/2017 1309  . furosemide (LASIX) injection 40 mg  40 mg Intravenous Q12H VacVaughan BastaD      . insulin aspart (novoLOG) injection 0-15 Units  0-15 Units Subcutaneous Q4H GupDimas ChyleD   8 Units at 12/20/2016 1244  . levothyroxine (SYNTHROID, LEVOTHROID) tablet 50 mcg  50 mcg Per NG tube QAC breakfast VacVaughan BastaD      . metoprolol tartrate (LOPRESSOR) tablet 25 mg  25 mg Per NG tube BID VacVaughan BastaD      . pantoprazole (PROTONIX) injection 40 mg   40 mg Intravenous Q24H VacVaughan BastaD   40 mg at 01/04/2017 1324  . propofol (DIPRIVAN) 1000 MG/100ML infusion  5-80 mcg/kg/min Intravenous Titrated GupDimas ChyleD 7.9  mL/hr at 01/11/2017 1300 10 mcg/kg/min at 12/21/2016 1300  . rosuvastatin (CRESTOR) tablet 20 mg  20 mg Per Tube QHS Vaughan Basta, MD      . valproate (DEPACON) 500 mg in dextrose 5 % 50 mL IVPB  500 mg Intravenous Q12H Vaughan Basta, MD 55 mL/hr at 12/27/2016 1314 500 mg at 01/07/2017 1314      Allergies: No Known Allergies    Past Medical History: Past Medical History:  Diagnosis Date  . Abnormal levels of other serum enzymes   . Anxiety   . Bipolar disorder ([place])   . BPH (benign prostatic hyperplasia)   . CAD (coronary artery disease)   . CHF (congestive heart failure) ([place])    ?  Marland Kitchen Depression   . Diabetes mellitus, type II ([place])   . Diabetes mellitus, type II, insulin dependent ([place])   . ED (erectile dysfunction)   . GERD (gastroesophageal reflux disease)   . Gout   . History of borderline personality disorder   . Hypertension   . Hypogonadism in male   . Hypothyroidism   . Insomnia   . Mood disorder ([place])   . Neuropathy   . Obesity   . OSA (obstructive sleep apnea)   . PVD (peripheral vascular disease) ([place])   . Thyroid disease   . Ventricular tachycardia [facility])    August 2018     Past Surgical History: Past Surgical History:  Procedure Laterality Date  . AMPUTATION TOE Left 08/25/2016   Procedure: AMPUTATION TOE/ipj left 2nd toe;  Surgeon: Sharlotte Alamo, DPM;  Location: [facility] ORS;  Service: Podiatry;  Laterality: Left;  . CARDIAC CATHETERIZATION N/A 10/12/2015   Procedure: Left Heart Cath and Coronary Angiography;  Surgeon: Teodoro Spray, MD;  Location: [place] [facility];  Service: Cardiovascular;  Laterality: N/A;  . CORONARY ANGIOPLASTY WITH STENT PLACEMENT     x 6 stents  . FOOT SURGERY Right    fracture repair with screw  . LEFT HEART CATH AND CORONARY ANGIOGRAPHY  Right 06/01/2016   Procedure: Left Heart Cath and Coronary Angiography;  Surgeon: Isaias Cowman, MD;  Location: [place] [facility];  Service: Cardiovascular;  Laterality: Right;  . LEFT HEART CATH AND CORONARY ANGIOGRAPHY N/A 11/28/2016   Procedure: LEFT HEART CATH AND CORONARY ANGIOGRAPHY and possible pci;  Surgeon: Yolonda Kida, MD;  Location: [place] [facility];  Service: Cardiovascular;  Laterality: N/A;  . MASTOIDECTOMY Right   . NASAL SEPTUM SURGERY    . penial inplant    . PENILE PROSTHESIS IMPLANT    . right mastoidectomy    . SCROTOPLASTY    . TONSILLECTOMY AND ADENOIDECTOMY       Family History: Family History  Problem Relation Age of Onset  . Lung cancer Mother   . Heart attack Father   . Post-traumatic stress disorder Father   . Anxiety disorder Father   . Depression Father   . Heart attack Sister   . Heart attack Brother   . Prostate cancer Neg Hx   . Kidney cancer Neg Hx      Social History: Social History   Social History  . Marital status: Single    Spouse name: N/A  . Number of children: N/A  . Years of education: N/A   Occupational History  . retired    Social History Main Topics  . Smoking status: Never Smoker  . Smokeless tobacco: Never Used  . Alcohol use No  . Drug use: No  . Sexual  activity: Not Currently    Birth control/ protection: None   Other Topics Concern  . Not on file   Social History Narrative   ** Merged History Encounter **         Review of Systems: Patient cannot provide as he is on the ventilator.  Vital Signs: Blood pressure 108/63, pulse (!) 56, temperature 98.1 F (36.7 C), resp. rate 20, height 6' 2"  (1.88 m), weight 131.5 kg (290 lb), SpO2 98 %.  Weight trends: Filed Weights   12/19/2016 0721  Weight: 131.5 kg (290 lb)    Physical Exam: General: Critically ill appearing   Head: Normocephalic, atraumatic.  Eyes: Anicteric, EOMI  Nose: Mucous membranes moist, not inflammed, nonerythematous.   Throat: ETT/OG in palce   Neck: Supple, trachea midline.  Lungs:  Basilar rales, vent assisted  Heart: HR 55, regular, no rubs  Abdomen:  BS normoactive. Soft, Nondistended, non-tender.  No masses or organomegaly.  Extremities: 2+ pretibial edema.  Neurologic: intubated  Skin: No visible rashes, scars.    Lab results: Basic Metabolic Panel:  Recent Labs Lab 12/10/16 1906 12/12/16 0428 12/17/2016 0723  NA 134* 137 131*  K 4.4 4.1 4.4  CL 99* 100* 96*  CO2 25 28 23   GLUCOSE 243* 191* 523*  BUN 27* 27* 47*  CREATININE 1.62* 1.28* 2.13*  CALCIUM 9.1 9.2 9.2  MG 2.0  --   --     Liver Function Tests:  Recent Labs Lab 12/24/2016 0723  AST 28  ALT 16*  ALKPHOS 87  BILITOT 0.8  PROT 8.2*  ALBUMIN 3.4*   No results for input(s): LIPASE, AMYLASE in the last 168 hours. No results for input(s): AMMONIA in the last 168 hours.  CBC:  Recent Labs Lab 12/10/16 0509 12/11/16 0424 12/12/16 0428 01/01/2017 0723  WBC 8.1 7.5 8.2 19.3*  HGB 10.1* 10.0* 10.4* 10.4*  HCT 30.4* 29.9* 31.3* 33.1*  MCV 86.4 85.7 85.3 91.5  PLT 344 346 395 560*    Cardiac Enzymes:  Recent Labs Lab 01/12/2017 0723 12/23/2016 0903 01/05/2017 1256  TROPONINI 0.41* 0.46* 1.01*    BNP: Invalid input(s): POCBNP  CBG:  Recent Labs Lab 12/12/16 2049 12/13/16 0811 12/22/2016 0721 12/30/2016 1015 12/29/2016 1144  GLUCAP 237* 99 434* 22* 298*    Microbiology: Results for orders placed or performed during the hospital encounter of 12/07/16  MRSA PCR Screening     Status: None   Collection Time: 12/07/16 10:39 PM  Result Value Ref Range Status   MRSA by PCR NEGATIVE NEGATIVE Final    Comment:        The GeneXpert MRSA Assay (FDA approved for NASAL specimens only), is one component of a comprehensive MRSA colonization surveillance program. It is not intended to diagnose MRSA infection nor to guide or monitor treatment for MRSA infections.     Coagulation Studies:  Recent Labs   01/08/2017 0723  LABPROT 15.5*  INR 1.24    Urinalysis: No results for input(s): COLORURINE, LABSPEC, PHURINE, GLUCOSEU, HGBUR, BILIRUBINUR, KETONESUR, PROTEINUR, UROBILINOGEN, NITRITE, LEUKOCYTESUR in the last 72 hours.  Invalid input(s): APPERANCEUR    Imaging: Dg Chest Portable 1 View  Result Date: 01/02/2017 CLINICAL DATA:  Intubation and orogastric tube placement. Follow-up pulmonary edema. EXAM: PORTABLE CHEST 1 VIEW 8:24 a.m.: COMPARISON:  Portable chest x-ray earlier today 7:16 a.m., 12/10/2016 and earlier. FINDINGS: Endotracheal tube tip in satisfactory position approximately 5 cm above the carina. OG tube courses below the diaphragm into the stomach, faintly visible. External  pacing pads are present. Cardiac silhouette mildly to moderately enlarged. Interval further worsening of interstitial and airspace pulmonary edema throughout both lungs, asymmetric increased on the right, since earlier this morning. Small bilateral pleural effusions. IMPRESSION: 1. Endotracheal tube tip in satisfactory position approximately 5 cm above carina. 2. OG tube courses below the diaphragm into the stomach. 3. Further worsening of interstitial and airspace pulmonary edema, asymmetric and increased on the right. 4. Small bilateral pleural effusions. Electronically Signed   By: Evangeline Dakin M.D.   On: 01/07/2017 09:02   Dg Chest Portable 1 View  Result Date: 01/04/2017 CLINICAL DATA:  Extra distress. EXAM: PORTABLE CHEST 1 VIEW COMPARISON:  12/10/2016 FINDINGS: Stable enlarged cardiac silhouette. There is diffuse airspace pattern suggesting combination of interstitial and pulmonary edema. No pneumothorax. No focal consolidation. IMPRESSION: Interstitial and mild pulmonary edema. Electronically Signed   By: Suzy Bouchard M.D.   On: 12/18/2016 08:04   Dg Abd Portable 1v  Result Date: 01/08/2017 CLINICAL DATA:  Repositioning of previously placed nasogastric tube at the patient bedside. EXAM: PORTABLE  ABDOMEN - 1 VIEW 11:13 a.m.: COMPARISON:  Portable abdomen x-ray earlier today 8:24 a.m. FINDINGS: Nasogastric tube tip now projects over the expected location of the gastric antrum. Visualized upper abdominal bowel gas pattern unremarkable. Extensive splenic artery calcification and calcified granulomata in the spleen are noted. IMPRESSION: Nasogastric tube tip now appropriately positioned in the gastric antrum. Electronically Signed   By: Evangeline Dakin M.D.   On: 12/28/2016 13:23   Dg Abd Portable 1 View  Result Date: 12/28/2016 CLINICAL DATA:  OG tube placement. EXAM: PORTABLE ABDOMEN - 1 VIEW COMPARISON:  Portable chest x-ray obtained concurrently. FINDINGS: Motion blurs the image. The OG tube is difficult to visualize due to the motion and the technique. I believe the to the tip is at the level of the gastric cardia. External pacing pads. Visualized upper abdominal bowel gas pattern unremarkable. IMPRESSION: Motion degraded image shows the OG tube tip at the level of the gastric cardia. This should be advanced. Electronically Signed   By: Evangeline Dakin M.D.   On: 01/14/2017 09:04      Assessment & Plan: Pt is a 66 y.o. male with a PMHx of Anxiety, bipolar disorder, BPH, coronary artery disease, depression, diabetes mellitus type 2, erectile dysfunction, GERD, gout, hypertension, hypothyroidism, peripheral neuropathy, obesity, chronic kidney disease stage III who was admitted to [facility] on 12/23/2016 for evaluation of chest pain. Upon evaluation the patient was found to be in atrial fibrillation versus flutter.   1. Acute renal failure/chronic kidney disease stage III Baseline creatinine 1.2 with an EGFR 58. Suspect that the patient's acute renal failure is related to heart failure and concomitant illness. No urgent indication for dialysis at the moment however patient is at high risk for worsening acute renal failure. Continue supportive care for now. We will obtain renal ultrasound to make sure  there is no underlying obstruction and we will also check SPEP, UPEP, and ANA.  2. Acute respiratory failure/acute on chronic systolic heart failure. BNP is noted to be quite high. The patient is currently maintained on the ventilator. Ventilatory support as per pulmonary/critical care.  3. Anemia of chronic kidney disease. Hemoglobin currently 10.4. No indication for Procrit at the moment. Continue to monitor CBC.  4. Thanks for consultation.

## 2016-12-16 NOTE — Progress Notes (Signed)
Dr. Clayborn Bigness notified of troponins (trending up). No new orders currently, report given to oncoming shift. Wilnette Kales

## 2016-12-16 NOTE — Progress Notes (Signed)
I spoke to Dr. Clayborn Bigness about the pt and urgent consult.

## 2016-12-16 NOTE — ED Notes (Addendum)
Nitro paste removed from chest per EDP [employer].

## 2016-12-16 NOTE — Progress Notes (Signed)
Patient intubated by Dr. Corky Downs with a #8 subglottic/oral et tube on first attempt with assistance of the glidescope. Positive color change noted on end tidal and bilateral breath sounds auscultated by Dr. Corky Downs. Tube was secured with commercial tube holder at 28 cm at the upper lip. Pt was then placed on trilogy ventilator (please see vent flow sheet for assessment).

## 2016-12-16 NOTE — Consult Note (Signed)
[facility] Consultation     ASSESSMENT  ACUTE HYPOXIC HYPERCAPNIC FAILURE FLUID OVERLOAD IN PT WITH CHF / AFIB / + TROPONIN ACUTE KIDNEY INJURY HYPERGLYCEMIA LACTIC ACIDOSIS HYPONATREMIA   PLAN Full ventilator support at this time. Wean O2 as tolerated. Diuresis as tolerated. Will add zosyn/vancomycin. Cardiology/Nephrology consult. Glycemic control. Repeat LA. DVT/GI prophylaxis. Full Code. No family at bedside.  Cc: 35 minutes  Dimas Chyle MD PCCM    Name: Dennis Mullen MRN: [id number] DOB: [birth year]    ADMISSION DATE:  01/13/2017   CONSULTATION DATE:  01/12/2017  REFERRING MD : Hospitalist  CHIEF COMPLAINT:  Respiratory failure  HISTORY OF PRESENT ILLNESS:   66 year old male with known history of Anxiety, Bipolar Disorder,CAD/CHF, DM, GERD, HTN, Hypothyroidism, OSA and PVD was recently discharged from hospital. Patient evaluated in ER for respiratory distress. Intubated after he failed BIPAP. Intubated and sedated at time of my evaluation and no family at bedside.    PAST MEDICAL HISTORY :  Past Medical History:  Diagnosis Date  . Abnormal levels of other serum enzymes   . Anxiety   . Bipolar disorder ([place])   . BPH (benign prostatic hyperplasia)   . CAD (coronary artery disease)   . CHF (congestive heart failure) ([place])    ?  Marland Kitchen Depression   . Diabetes mellitus, type II ([place])   . Diabetes mellitus, type II, insulin dependent ([place])   . ED (erectile dysfunction)   . GERD (gastroesophageal reflux disease)   . Gout   . History of borderline personality disorder   . Hypertension   . Hypogonadism in male   . Hypothyroidism   . Insomnia   . Mood disorder ([place])   . Neuropathy   . Obesity   . OSA (obstructive sleep apnea)   . PVD (peripheral vascular disease) ([place])   . Thyroid disease   . Ventricular tachycardia [facility])    August 2018   Past Surgical History:  Procedure Laterality Date  . AMPUTATION TOE Left 08/25/2016   Procedure: AMPUTATION TOE/ipj left 2nd toe;  Surgeon: Sharlotte Alamo, DPM;  Location: [facility] ORS;  Service: Podiatry;  Laterality: Left;  . CARDIAC CATHETERIZATION N/A 10/12/2015   Procedure: Left Heart Cath and Coronary Angiography;  Surgeon: Teodoro Spray, MD;  Location: [place] [facility];  Service: Cardiovascular;  Laterality: N/A;  . CORONARY ANGIOPLASTY WITH STENT PLACEMENT     x 6 stents  . FOOT SURGERY Right    fracture repair with screw  . LEFT HEART CATH AND CORONARY ANGIOGRAPHY Right 06/01/2016   Procedure: Left Heart Cath and Coronary Angiography;  Surgeon: Isaias Cowman, MD;  Location: [place] [facility];  Service: Cardiovascular;  Laterality: Right;  . LEFT HEART CATH AND CORONARY ANGIOGRAPHY N/A 11/28/2016   Procedure: LEFT HEART CATH AND CORONARY ANGIOGRAPHY and possible pci;  Surgeon: Yolonda Kida, MD;  Location: [place] [facility];  Service: Cardiovascular;  Laterality: N/A;  . MASTOIDECTOMY Right   . NASAL SEPTUM SURGERY    . penial inplant    . PENILE PROSTHESIS IMPLANT    . right mastoidectomy    . SCROTOPLASTY    . TONSILLECTOMY AND ADENOIDECTOMY     Prior to Admission medications   Medication Sig Start Date End Date Taking? Authorizing Provider  allopurinol (ZYLOPRIM) 100 MG tablet Take 100 mg by mouth daily.    Yes [provider]  amiodarone (PACERONE) 400 MG tablet Take 1 tablet (400 mg total) by mouth daily.  12/13/16  Yes Sudini, Alveta Heimlich, MD  apixaban (ELIQUIS) 5 MG TABS tablet Take 1 tablet (5 mg total) by mouth 2 (two) times daily. 06/02/16  Yes Sudini, Alveta Heimlich, MD  clopidogrel (PLAVIX) 75 MG tablet Take 75 mg by mouth daily. Reported on 07/26/2015   Yes [provider]  colchicine 0.6 MG tablet Take 0.6 mg by mouth 2 (two) times daily as needed (gout flare).  11/08/15  Yes [provider]  Continuous Blood Gluc Sensor (FREESTYLE LIBRE SENSOR SYSTEM) MISC Use 1 each as directed. Please place prior to office visit as directed  06/20/16  Yes [provider]  diclofenac (VOLTAREN) 75 MG EC tablet Take 75 mg by mouth daily as needed (gout flare).  12/09/15  Yes [provider]  divalproex (DEPAKOTE ER) 500 MG 24 hr tablet Take 1-2 tablets (500-1,000 mg total) by mouth 2 (two) times daily. Take 596m in morning and 10019min evening 08/24/16  Yes Clapacs, JoMadie RenoMD  DULoxetine (CYMBALTA) 60 MG capsule Take 1 capsule (60 mg total) by mouth daily. 08/24/16  Yes Clapacs, JoMadie RenoMD  esomeprazole (NEXIUM) 40 MG capsule Take 40 mg by mouth daily.  10/14/14  Yes [provider]  fenofibrate (TRICOR) 145 MG tablet Take 145 mg by mouth daily.  12/23/15 12/22/16 Yes [provider]  furosemide (LASIX) 40 MG tablet Take 1 tablet (40 mg total) by mouth daily. 12/07/16  Yes StCarrie MewMD  gemfibrozil (LOPID) 600 MG tablet gemfibrozil 600 mg tablet   Yes [provider]  glucose 4 GM chewable tablet Chew 4 g by mouth as needed for low blood sugar.    Yes [provider]  Insulin Disposable Pump (V-GO 30) KIT Inject 1.2 Units/hr into the skin every hour. 66 units/24 hrs (36 available bolus units--12 bolus units available per meals) 10/07/14  Yes [provider]  insulin lispro (HUMALOG KWIKPEN) 100 UNIT/ML KiwkPen Inject into the skin See admin instructions. Used ONLY as needed if additional bolus units are needed (up to 36 units/24 hrs.)   Yes [provider]  isosorbide mononitrate (IMDUR) 60 MG 24 hr tablet Take 60 mg by mouth daily.  05/09/16 05/09/17 Yes [provider]  JUBLIA 10 % SOLN Apply 1 application topically daily. 10/31/16  Yes [provider]  levothyroxine (SYNTHROID, LEVOTHROID) 50 MCG tablet Take 50 mcg by mouth daily before breakfast.   Yes [provider]  metFORMIN (GLUCOPHAGE) 1000 MG tablet Take 1,000 mg by mouth 2 (two) times daily with a meal. Morning & supper   Yes [provider]  metoprolol succinate (TOPROL-XL)  50 MG 24 hr tablet Take 50 mg by mouth daily.  09/11/14  Yes [provider]  nitroGLYCERIN (NITROSTAT) 0.4 MG SL tablet Place 0.4 mg under the tongue every 5 (five) minutes x 3 doses as needed for chest pain.    Yes [provider]  pregabalin (LYRICA) 100 MG capsule Take 100 mg by mouth 3 (three) times daily before meals.    Yes [provider]  rOPINIRole (REQUIP) 2 MG tablet Take 2 mg by mouth 2 (two) times daily.  04/27/15  Yes [provider]  rosuvastatin (CRESTOR) 20 MG tablet Take 20 mg by mouth at bedtime.    Yes [provider]  VICTOZA 18 MG/3ML SOPN Inject 1.8 mg into the skin daily.  09/07/14  Yes [provider]  vitamin C (ASCORBIC ACID) 500 MG tablet Take 500 mg by mouth daily.   Yes  [provider]   No Known Allergies  FAMILY HISTORY:  Family History  Problem Relation Age of Onset  . Lung cancer Mother   . Heart attack Father   . Post-traumatic stress disorder Father   . Anxiety disorder Father   . Depression Father   . Heart attack Sister   . Heart attack Brother   . Prostate cancer Neg Hx   . Kidney cancer Neg Hx    SOCIAL HISTORY:  reports that he has never smoked. He has never used smokeless tobacco. He reports that he does not drink alcohol or use drugs.  REVIEW OF SYSTEMS:   Constitutional: Feels well. Cardiovascular: No chest pain.  Pulmonary: Denies dyspnea.   The remainder of systems were reviewed and were found to be negative other than what is documented in the HPI.    VITAL SIGNS: Temp:  [97.7 F (36.5 C)-99.4 F (37.4 C)] 98.1 F (36.7 C) (09/01 1330) Pulse Rate:  [52-89] 56 (09/01 1330) Resp:  [12-36] 20 (09/01 1330) BP: (68-217)/(47-168) 108/63 (09/01 1330) SpO2:  [88 %-100 %] 98 % (09/01 1330) FiO2 (%):  [100 %] 100 % (09/01 1025) Weight:  [290 lb (131.5 kg)] 290 lb (131.5 kg) (09/01 0721) HEMODYNAMICS:   VENTILATOR SETTINGS: Vent Mode: AC FiO2 (%):  [100 %] 100 % Set Rate:   [18 bmp] 18 bmp Vt Set:  [500 mL] 500 mL PEEP:  [10 cmH20] 10 cmH20 INTAKE / OUTPUT: No intake or output data in the 24 hours ending 12/31/2016 1350  Physical Examination:   VS: BP 108/63   Pulse (!) 56   Temp 98.1 F (36.7 C)   Resp 20   Ht _0  (1.88 m)   Wt 290 lb (131.5 kg)   SpO2 98%   BMI 37.23 kg/m   General Appearance: intubated Neuro: sedated HEENT: ETT + Pulmonary: decreased air entry b/l, no wheezing heard CardiovascularNormal S1,S2.  No m/r/g.    Abdomen: Benign, Soft, non-tender, No masses, hepatosplenomegaly, No lymphadenopathy Skin:   warm, no rashes, no ecchymosis  Extremities: normal, no cyanosis, clubbing, no edema, warm with normal capillary refill.    LABS: Reviewed  LABORATORY PANEL:   CBC  Recent Labs Lab 01/01/2017 0723  WBC 19.3*  HGB 10.4*  HCT 33.1*  PLT 560*    Chemistries   Recent Labs Lab 12/10/16 1906  12/27/2016 0723  NA 134*  < > 131*  K 4.4  < > 4.4  CL 99*  < > 96*  CO2 25  < > 23  GLUCOSE 243*  < > 523*  BUN 27*  < > 47*  CREATININE 1.62*  < > 2.13*  CALCIUM 9.1  < > 9.2  MG 2.0  --   --   AST  --   --  28  ALT  --   --  16*  ALKPHOS  --   --  87  BILITOT  --   --  0.8  < > = values in this interval not displayed.   Recent Labs Lab 12/12/16 1603 12/12/16 2049 12/13/16 0811 12/20/2016 0721 01/07/2017 1015 12/20/2016 1144  GLUCAP 153* 237* 99 434* 344* 298*    Recent Labs Lab 12/30/2016 0724 12/21/2016 0745  PHART 7.11* 7.27*  PCO2ART 58* 59*  PO2ART 71* 96    Recent Labs Lab 01/14/2017 0723  AST 28  ALT 16*  ALKPHOS 87  BILITOT 0.8  ALBUMIN 3.4*    Cardiac Enzymes  Recent Labs Lab 01/09/2017 1256  TROPONINI 1.01*    RADIOLOGY:  Dg Chest Portable 1 View  Result Date: 12/25/2016 CLINICAL DATA:  Intubation and orogastric tube placement. Follow-up pulmonary edema. EXAM: PORTABLE CHEST 1 VIEW 8:24 a.m.: COMPARISON:  Portable chest x-ray earlier today 7:16 a.m., 12/10/2016 and earlier. FINDINGS:  Endotracheal tube tip in satisfactory position approximately 5 cm above the carina. OG tube courses below the diaphragm into the stomach, faintly visible. External pacing pads are present. Cardiac silhouette mildly to moderately enlarged. Interval further worsening of interstitial and airspace pulmonary edema throughout both lungs, asymmetric increased on the right, since earlier this morning. Small bilateral pleural effusions. IMPRESSION: 1. Endotracheal tube tip in satisfactory position approximately 5 cm above carina. 2. OG tube courses below the diaphragm into the stomach. 3. Further worsening of interstitial and airspace pulmonary edema, asymmetric and increased on the right. 4. Small bilateral pleural effusions. Electronically Signed   By: Evangeline Dakin M.D.   On: 01/07/2017 09:02   Dg Chest Portable 1 View  Result Date: 01/03/2017 CLINICAL DATA:  Extra distress. EXAM: PORTABLE CHEST 1 VIEW COMPARISON:  12/10/2016 FINDINGS: Stable enlarged cardiac silhouette. There is diffuse airspace pattern suggesting combination of interstitial and pulmonary edema. No pneumothorax. No focal consolidation. IMPRESSION: Interstitial and mild pulmonary edema. Electronically Signed   By: Suzy Bouchard M.D.   On: 12/18/2016 08:04   Dg Abd Portable 1v  Result Date: 01/03/2017 CLINICAL DATA:  Repositioning of previously placed nasogastric tube at the patient bedside. EXAM: PORTABLE ABDOMEN - 1 VIEW 11:13 a.m.: COMPARISON:  Portable abdomen x-ray earlier today 8:24 a.m. FINDINGS: Nasogastric tube tip now projects over the expected location of the gastric antrum. Visualized upper abdominal bowel gas pattern unremarkable. Extensive splenic artery calcification and calcified granulomata in the spleen are noted. IMPRESSION: Nasogastric tube tip now appropriately positioned in the gastric antrum. Electronically Signed   By: Evangeline Dakin M.D.   On: 01/01/2017 13:23   Dg Abd Portable 1 View  Result Date:  01/01/2017 CLINICAL DATA:  OG tube placement. EXAM: PORTABLE ABDOMEN - 1 VIEW COMPARISON:  Portable chest x-ray obtained concurrently. FINDINGS: Motion blurs the image. The OG tube is difficult to visualize due to the motion and the technique. I believe the to the tip is at the level of the gastric cardia. External pacing pads. Visualized upper abdominal bowel gas pattern unremarkable. IMPRESSION: Motion degraded image shows the OG tube tip at the level of the gastric cardia. This should be advanced. Electronically Signed   By: Evangeline Dakin M.D.   On: 01/10/2017 09:04       12/28/2016, 1:50 PM

## 2016-12-16 NOTE — ED Triage Notes (Signed)
Pt presents from home via [facility] c/o respiratory distress. SOB began last night, worsening this morning. EMS report pt initially refused IV sticks, on arrival pt has eyes open, but is not responding verbally to staff. Hx multiple cardiac caths, last one 11/28/16. Arrives on cpap.

## 2016-12-16 NOTE — Progress Notes (Signed)
Decreased to 80% fio2.

## 2016-12-16 NOTE — ED Notes (Signed)
Date and time results received: 12/30/2016 0755 (use smartphrase ".now" to insert current time)  Test: lactic acid Critical Value: 6.8 Name of Provider Notified: dr. Corky Downs  Orders Received? Or Actions Taken?: Actions Taken: no new orders at this time

## 2016-12-16 NOTE — Consult Note (Signed)
ANTICOAGULATION CONSULT NOTE - Initial Consult  Pharmacy Consult for enoxaparin Indication: VTE prophylaxis  No Known Allergies  Patient Measurements: Height: 6\' 2"  (188 cm) Weight: 290 lb (131.5 kg) IBW/kg (Calculated) : 82.2 Heparin Dosing Weight:   Vital Signs: Temp: 98.6 F (37 C) (09/01 0945) Temp Source: Axillary (09/01 0745) BP: 109/55 (09/01 0945) Pulse Rate: 64 (09/01 0945)  Labs:  Recent Labs  01/14/2017 0723 01/12/2017 0903  HGB 10.4*  --   HCT 33.1*  --   PLT 560*  --   APTT 38*  --   LABPROT 15.5*  --   INR 1.24  --   CREATININE 2.13*  --   TROPONINI 0.41* 0.46*    Estimated Creatinine Clearance: 50.5 mL/min (A) (by C-G formula based on SCr of 2.13 mg/dL (H)).   Medical History: Past Medical History:  Diagnosis Date  . Abnormal levels of other serum enzymes   . Anxiety   . Bipolar disorder ([place])   . BPH (benign prostatic hyperplasia)   . CAD (coronary artery disease)   . CHF (congestive heart failure) ([place])    ?  Marland Kitchen Depression   . Diabetes mellitus, type II ([place])   . Diabetes mellitus, type II, insulin dependent ([place])   . ED (erectile dysfunction)   . GERD (gastroesophageal reflux disease)   . Gout   . History of borderline personality disorder   . Hypertension   . Hypogonadism in male   . Hypothyroidism   . Insomnia   . Mood disorder ([place])   . Neuropathy   . Obesity   . OSA (obstructive sleep apnea)   . PVD (peripheral vascular disease) ([place])   . Thyroid disease   . Ventricular tachycardia [facility])    August 2018    Medications:  Scheduled:  . enoxaparin (LOVENOX) injection  1 mg/kg Subcutaneous Q12H    Assessment: [name] with a PMH of afib on apixaban PTA. Pt is now intubated. Pharmacy consulted to dose therapeutic lovenox. Pt last dose of apixaban was yesterday (8/30) Goal of Therapy:   Monitor platelets by anticoagulation protocol: Yes   Plan:  Lovenox 1mg /kg q 12 hr  Besnik Febus D Jayshawn Colston, Pharm.D,  BCPS Clinical Pharmacist  01/08/2017,10:28 AM

## 2016-12-16 NOTE — ED Notes (Signed)
INSULIN PUMP noted to R UPPER ARM.

## 2016-12-16 NOTE — ED Notes (Signed)
Date and time results received: 12/18/2016 8:17 AM  (use smartphrase ".now" to insert current time)  Test: *troponin Critical Value: 0.41 Name of Provider Notified: dr. Corky Downs  Orders Received? Or Actions Taken?: Actions Taken: no new orders at this time

## 2016-12-16 NOTE — Consult Note (Addendum)
[place] NOTE  Pharmacy Consult for electrolytes/ vanc/zosyn Indication:    No Known Allergies  Patient Measurements: Height: 6\' 2"  (188 cm) Weight: 290 lb (131.5 kg) IBW/kg (Calculated) : 82.2 Adjusted Body Weight:   Vital Signs: Temp: 98.1 F (36.7 C) (09/01 1330) Temp Source: Axillary (09/01 0745) BP: 108/63 (09/01 1330) Pulse Rate: 56 (09/01 1330) Intake/Output from previous day: No intake/output data recorded. Intake/Output from this shift: Total I/O In: 175.1 [I.V.:120.1; IV Piggyback:55] Out: 550 [Urine:550] Vent settings for last 24 hours: Vent Mode: AC FiO2 (%):  [100 %] 100 % Set Rate:  [18 bmp] 18 bmp Vt Set:  [500 mL] 500 mL PEEP:  [10 cmH20] 10 cmH20  Labs:  Recent Labs  12/17/2016 0723 01/11/2017 1447  WBC 19.3*  --   HGB 10.4*  --   HCT 33.1*  --   PLT 560*  --   APTT 38*  --   INR 1.24  --   CREATININE 2.13* 2.16*  MG  --  2.1  PHOS  --  5.9*  ALBUMIN 3.4*  --   PROT 8.2*  --   AST 28  --   ALT 16*  --   ALKPHOS 87  --   BILITOT 0.8  --    Estimated Creatinine Clearance: 49.8 mL/min (A) (by C-G formula based on SCr of 2.16 mg/dL (H)).   Recent Labs  12/27/2016 0721 01/04/2017 1015 01/06/2017 1144  GLUCAP 434* 344* 298*    Microbiology: Recent Results (from the past 720 hour(s))  MRSA PCR Screening     Status: None   Collection Time: 11/30/16  9:06 AM  Result Value Ref Range Status   MRSA by PCR NEGATIVE NEGATIVE Final    Comment:        The GeneXpert MRSA Assay (FDA approved for NASAL specimens only), is one component of a comprehensive MRSA colonization surveillance program. It is not intended to diagnose MRSA infection nor to guide or monitor treatment for MRSA infections.   MRSA PCR Screening     Status: None   Collection Time: 12/07/16 10:39 PM  Result Value Ref Range Status   MRSA by PCR NEGATIVE NEGATIVE Final    Comment:        The GeneXpert MRSA Assay (FDA approved for NASAL  specimens only), is one component of a comprehensive MRSA colonization surveillance program. It is not intended to diagnose MRSA infection nor to guide or monitor treatment for MRSA infections.     Medications:  Scheduled:  . clopidogrel  75 mg Per NG tube Daily  . enoxaparin (LOVENOX) injection  1 mg/kg Subcutaneous Q12H  . fentaNYL (SUBLIMAZE) injection  50 mcg Intravenous Once  . furosemide  40 mg Intravenous Q12H  . insulin aspart  0-15 Units Subcutaneous Q4H  . levothyroxine  50 mcg Per NG tube QAC breakfast  . metoprolol tartrate  25 mg Per NG tube BID  . pantoprazole (PROTONIX) IV  40 mg Intravenous Q24H  . rosuvastatin  20 mg Per Tube QHS    Assessment: [name] admitted for respiratory distress. Currently intubated and found to have pulmonary edema, possible PNA. Pharmacy consulted for electrolyte managament, vanc and zosyn dosing. Pt currently receiving diuretics  Goal of Therapy:  vanc trough 15-20 normalization of electrolytes  Plan:  Zosyn 3.375g q 8hr EI dosing  Vancomycin 1750mg  once Pt is currently in ARF, will therefore dose off of levels. Will check a level in 24  hours to evaluate clearance.  Electrolytes: no relacement warranted. Will continue to monitor with provider ordered BMPs. Next check at 2100. N  MLS  12/30/2016,4:16 PM   9/1 2115 electrolytes. No replacement indicated. F/u BMP with AM labs.  9/2 AM electrolytes. No replacement indicated. F/u BMP with tomorrow AM labs.  Sim Boast, PharmD, BCPS  12/27/2016 10:17 PM

## 2016-12-16 NOTE — Progress Notes (Signed)
Recent admission for CAD, V tach, CHF. D/c 2 days ago. Came with respi distress Noted to have lactic acidosis, metabolic acidosis. Intubated. Admit to ICU for respi failure, CHF exacerbation, lactic acidosis,. High blood sugar. I spoke to Dr. Lyndel Safe( intensivist on phone)

## 2016-12-16 NOTE — H&P (Addendum)
[place] [facility] [place] NAME: Dennis Mullen    MR#:  [id number]  DATE OF BIRTH:  22-Oct-1950  DATE OF ADMISSION:  01/06/2017  PRIMARY CARE PHYSICIAN: Lavera Guise, MD   REQUESTING/REFERRING PHYSICIAN: Corky Downs  CHIEF COMPLAINT:   Chief Complaint  Patient presents with  . Respiratory Distress    HISTORY OF PRESENT ILLNESS: Dennis Mullen  is a 66 y.o. male with a known history of bipolar disorder, coronary artery disease, congestive heart failure, diabetes mellitus type 2, sleep apnea, peripheral vascular disease presented to the emergency room with increased shortness of breath. He had repeated admissions every week for last few weeks. Had cardiac cath, vent tachycardia, CHF in last few weeks,. Last discharge was 12/13/16. On arrival noted in severe respi distress, Pulm edema and acidotic on ABG - Intubated by ER and called [place] for admission.  PAST MEDICAL HISTORY:   Past Medical History:  Diagnosis Date  . Abnormal levels of other serum enzymes   . Anxiety   . Bipolar disorder ([place])   . BPH (benign prostatic hyperplasia)   . CAD (coronary artery disease)   . CHF (congestive heart failure) ([place])    ?  Mar[place] Kitchen Depression   . Diabetes mellitus, type II ([place])   . Diabetes mellitus, type II, insulin dependent ([place])   . ED (erectile dysfunction)   . GERD (gastroesophageal reflux disease)   . Gout   . History of borderline personality disorder   . Hypertension   . Hypogonadism in male   . Hypothyroidism   . Insomnia   . Mood disorder ([place])   . Neuropathy   . Obesity   . OSA (obstructive sleep apnea)   . PVD (peripheral vascular disease) ([place])   . Thyroid disease   . Ventricular tachycardia [facility])    August 2018    PAST SURGICAL HISTORY: Past Surgical History:  Procedure Laterality Date  . AMPUTATION TOE Left 08/25/2016   Procedure: AMPUTATION TOE/ipj left 2nd toe;  Surgeon: Sharlotte Alamo, DPM;  Location: [facility] ORS;  Service: Podiatry;   Laterality: Left;  . CARDIAC CATHETERIZATION N/A 10/12/2015   Procedure: Left Heart Cath and Coronary Angiography;  Surgeon: Teodoro Spray, MD;  Location: [place] [facility];  Service: Cardiovascular;  Laterality: N/A;  . CORONARY ANGIOPLASTY WITH STENT PLACEMENT     x 6 stents  . FOOT SURGERY Right    fracture repair with screw  . LEFT HEART CATH AND CORONARY ANGIOGRAPHY Right 06/01/2016   Procedure: Left Heart Cath and Coronary Angiography;  Surgeon: Isaias Cowman, MD;  Location: [place] [facility];  Service: Cardiovascular;  Laterality: Right;  . LEFT HEART CATH AND CORONARY ANGIOGRAPHY N/A 11/28/2016   Procedure: LEFT HEART CATH AND CORONARY ANGIOGRAPHY and possible pci;  Surgeon: Yolonda Kida, MD;  Location: [place] [facility] LAB;  Service: Cardiovascular;  Laterality: N/A;  . MASTOIDECTOMY Right   . NASAL SEPTUM SURGERY    . penial inplant    . PENILE PROSTHESIS IMPLANT    . right mastoidectomy    . SCROTOPLASTY    . TONSILLECTOMY AND ADENOIDECTOMY      SOCIAL HISTORY:  Social History  Substance Use Topics  . Smoking status: Never Smoker  . Smokeless tobacco: Never Used  . Alcohol use No    FAMILY HISTORY:  Family History  Problem Relation Age of Onset  . Lung cancer Mother   . Heart attack Father   . Post-traumatic stress disorder Father   .  Anxiety disorder Father   . Depression Father   . Heart attack Sister   . Heart attack Brother   . Prostate cancer Neg Hx   . Kidney cancer Neg Hx     DRUG ALLERGIES: No Known Allergies  REVIEW OF SYSTEMS:   Pt is intubated, not able to give ROS.  MEDICATIONS AT HOME:  Prior to Admission medications   Medication Sig Start Date End Date Taking? Authorizing Provider  allopurinol (ZYLOPRIM) 100 MG tablet Take 100 mg by mouth daily.    Yes [provider]  amiodarone (PACERONE) 400 MG tablet Take 1 tablet (400 mg total) by mouth daily. 12/13/16  Yes Sudini, Srikar, MD  apixaban (ELIQUIS) 5 MG TABS  tablet Take 1 tablet (5 mg total) by mouth 2 (two) times daily. 06/02/16  Yes Sudini, Srikar, MD  clopidogrel (PLAVIX) 75 MG tablet Take 75 mg by mouth daily. Reported on 07/26/2015   Yes [provider]  colchicine 0.6 MG tablet Take 0.6 mg by mouth 2 (two) times daily as needed (gout flare).  11/08/15  Yes [provider]  Continuous Blood Gluc Sensor (FREESTYLE LIBRE SENSOR SYSTEM) MISC Use 1 each as directed. Please place prior to office visit as directed 06/20/16  Yes [provider]  diclofenac (VOLTAREN) 75 MG EC tablet Take 75 mg by mouth daily as needed (gout flare).  12/09/15  Yes [provider]  divalproex (DEPAKOTE ER) 500 MG 24 hr tablet Take 1-2 tablets (500-1,000 mg total) by mouth 2 (two) times daily. Take 500mg in morning and 1000mg in evening 08/24/16  Yes Clapacs, John T, MD  DULoxetine (CYMBALTA) 60 MG capsule Take 1 capsule (60 mg total) by mouth daily. 08/24/16  Yes Clapacs, John T, MD  esomeprazole (NEXIUM) 40 MG capsule Take 40 mg by mouth daily.  10/14/14  Yes [provider]  fenofibrate (TRICOR) 145 MG tablet Take 145 mg by mouth daily.  12/23/15 12/22/16 Yes [provider]  furosemide (LASIX) 40 MG tablet Take 1 tablet (40 mg total) by mouth daily. 12/07/16  Yes Stafford, Phillip, MD  gemfibrozil (LOPID) 600 MG tablet gemfibrozil 600 mg tablet   Yes [provider]  glucose 4 GM chewable tablet Chew 4 g by mouth as needed for low blood sugar.    Yes [provider]  Insulin Disposable Pump (V-GO 30) KIT Inject 1.2 Units/hr into the skin every hour. 66 units/24 hrs (36 available bolus units--12 bolus units available per meals) 10/07/14  Yes [provider]  insulin lispro (HUMALOG KWIKPEN) 100 UNIT/ML KiwkPen Inject into the skin See admin instructions. Used ONLY as needed if additional bolus units are needed (up to 36 units/24 hrs.)   Yes [provider]  isosorbide mononitrate (IMDUR) 60 MG 24 hr  tablet Take 60 mg by mouth daily.  05/09/16 05/09/17 Yes [provider]  JUBLIA 10 % SOLN Apply 1 application topically daily. 10/31/16  Yes [provider]  levothyroxine (SYNTHROID, LEVOTHROID) 50 MCG tablet Take 50 mcg by mouth daily before breakfast.   Yes [provider]  metFORMIN (GLUCOPHAGE) 1000 MG tablet Take 1,000 mg by mouth 2 (two) times daily with a meal. Morning & supper   Yes [provider]  metoprolol succinate (TOPROL-XL) 50 MG 24 hr tablet Take 50 mg by mouth daily.  09/11/14  Yes [provider]  nitroGLYCERIN (NITROSTAT) 0.4 MG SL tablet Place 0.4 mg under the tongue every 5 (five) minutes x 3 doses as needed for   chest pain.    Yes [provider]  pregabalin (LYRICA) 100 MG capsule Take 100 mg by mouth 3 (three) times daily before meals.    Yes [provider]  rOPINIRole (REQUIP) 2 MG tablet Take 2 mg by mouth 2 (two) times daily.  04/27/15  Yes [provider]  rosuvastatin (CRESTOR) 20 MG tablet Take 20 mg by mouth at bedtime.    Yes [provider]  VICTOZA 18 MG/3ML SOPN Inject 1.8 mg into the skin daily.  09/07/14  Yes [provider]  vitamin C (ASCORBIC ACID) 500 MG tablet Take 500 mg by mouth daily.   Yes [provider]      PHYSICAL EXAMINATION:   VITAL SIGNS: Blood pressure (!) 77/51, pulse (!) 52, temperature 98.2 F (36.8 C), resp. rate 16, height 6' 2" (1.88 m), weight 131.5 kg (290 lb), SpO2 100 %.  GENERAL:  66 y.o.-year-old patient lying in the bed with acute distress, critical appearing. EYES: Pupils equal, round, reactive to light. No scleral icterus. Extraocular muscles did not check, pt is sedated.  HEENT: Head atraumatic, normocephalic. Oropharynx and nasopharynx clear.  NECK:  Supple, no jugular venous distention. No thyroid enlargement, no tenderness.  LUNGS: Normal breath sounds bilaterally, no wheezing, b/l crepitation. No use of accessory muscles of  respiration. On vent support via ET tube. CARDIOVASCULAR: S1, S2 normal. No murmurs, rubs, or gallops.  ABDOMEN: Soft, nontender, nondistended. Bowel sounds present. No organomegaly or mass.  EXTREMITIES: No pedal edema, cyanosis, or clubbing.  NEUROLOGIC: pt is on vent support, sedated. PSYCHIATRIC: sedated on vent.  SKIN: No obvious rash, lesion, or ulcer. Right arm- insuline delivery device is stuck.  LABORATORY PANEL:   CBC  Recent Labs Lab 12/10/16 0509 12/11/16 0424 12/12/16 0428 01/08/2017 0723  WBC 8.1 7.5 8.2 19.3*  HGB 10.1* 10.0* 10.4* 10.4*  HCT 30.4* 29.9* 31.3* 33.1*  PLT 344 346 395 560*  MCV 86.4 85.7 85.3 91.5  MCH 28.7 28.5 28.3 28.8  MCHC 33.3 33.3 33.2 31.5*  RDW 17.9* 18.3* 17.9* 19.3*   ------------------------------------------------------------------------------------------------------------------  Chemistries   Recent Labs Lab 12/10/16 1906 12/12/16 0428 12/24/2016 0723  NA 134* 137 131*  K 4.4 4.1 4.4  CL 99* 100* 96*  CO2 25 28 23  GLUCOSE 243* 191* 523*  BUN 27* 27* 47*  CREATININE 1.62* 1.28* 2.13*  CALCIUM 9.1 9.2 9.2  MG 2.0  --   --   AST  --   --  28  ALT  --   --  16*  ALKPHOS  --   --  87  BILITOT  --   --  0.8   ------------------------------------------------------------------------------------------------------------------ estimated creatinine clearance is 50.5 mL/min (A) (by C-G formula based on SCr of 2.13 mg/dL (H)). ------------------------------------------------------------------------------------------------------------------ No results for input(s): TSH, T4TOTAL, T3FREE, THYROIDAB in the last 72 hours.  Invalid input(s): FREET3   Coagulation profile  Recent Labs Lab 12/20/2016 0723  INR 1.24   ------------------------------------------------------------------------------------------------------------------- No results for input(s): DDIMER in the last 72  hours. -------------------------------------------------------------------------------------------------------------------  Cardiac Enzymes  Recent Labs Lab 01/08/2017 0723 12/28/2016 0903  TROPONINI 0.41* 0.46*   ------------------------------------------------------------------------------------------------------------------ Invalid input(s): POCBNP  ---------------------------------------------------------------------------------------------------------------  Urinalysis    Component Value Date/Time   COLORURINE YELLOW (A) 11/14/2014 1415   APPEARANCEUR CLEAR (A) 11/14/2014 1415   APPEARANCEUR Clear 01/26/2014 0759   LABSPEC 1.013 11/14/2014 1415   LABSPEC 1.021 01/26/2014 0759   PHURINE 5.0 11/14/2014 1415   GLUCOSEU NEGATIVE 11/14/2014 1415   GLUCOSEU >=500   01/26/2014 0759   HGBUR NEGATIVE 11/14/2014 1415   [place] 11/14/2014 1415   BILIRUBINUR Negative 01/26/2014 [place] 11/14/2014 1415   PROTEINUR NEGATIVE 11/14/2014 1415   NITRITE NEGATIVE 11/14/2014 1415   LEUKOCYTESUR NEGATIVE 11/14/2014 1415   LEUKOCYTESUR Negative 01/26/2014 0759     RADIOLOGY: Dg Chest Portable 1 View  Result Date: 12/25/2016 CLINICAL DATA:  Intubation and orogastric tube placement. Follow-up pulmonary edema. EXAM: PORTABLE CHEST 1 VIEW 8:24 a.m.: COMPARISON:  Portable chest x-ray earlier today 7:16 a.m., 12/10/2016 and earlier. FINDINGS: Endotracheal tube tip in satisfactory position approximately 5 cm above the carina. OG tube courses below the diaphragm into the stomach, faintly visible. External pacing pads are present. Cardiac silhouette mildly to moderately enlarged. Interval further worsening of interstitial and airspace pulmonary edema throughout both lungs, asymmetric increased on the right, since earlier this morning. Small bilateral pleural effusions. IMPRESSION: 1. Endotracheal tube tip in satisfactory position approximately 5 cm above carina. 2. OG tube  courses below the diaphragm into the stomach. 3. Further worsening of interstitial and airspace pulmonary edema, asymmetric and increased on the right. 4. Small bilateral pleural effusions. Electronically Signed   By: Evangeline Dakin M.D.   On: 12/30/2016 09:02   Dg Chest Portable 1 View  Result Date: 12/22/2016 CLINICAL DATA:  Extra distress. EXAM: PORTABLE CHEST 1 VIEW COMPARISON:  12/10/2016 FINDINGS: Stable enlarged cardiac silhouette. There is diffuse airspace pattern suggesting combination of interstitial and pulmonary edema. No pneumothorax. No focal consolidation. IMPRESSION: Interstitial and mild pulmonary edema. Electronically Signed   By: Suzy Bouchard M.D.   On: 12/23/2016 08:04   Dg Abd Portable 1v  Result Date: 01/03/2017 CLINICAL DATA:  Repositioning of previously placed nasogastric tube at the patient bedside. EXAM: PORTABLE ABDOMEN - 1 VIEW 11:13 a.m.: COMPARISON:  Portable abdomen x-ray earlier today 8:24 a.m. FINDINGS: Nasogastric tube tip now projects over the expected location of the gastric antrum. Visualized upper abdominal bowel gas pattern unremarkable. Extensive splenic artery calcification and calcified granulomata in the spleen are noted. IMPRESSION: Nasogastric tube tip now appropriately positioned in the gastric antrum. Electronically Signed   By: Evangeline Dakin M.D.   On: 01/11/2017 13:23   Dg Abd Portable 1 View  Result Date: 01/08/2017 CLINICAL DATA:  OG tube placement. EXAM: PORTABLE ABDOMEN - 1 VIEW COMPARISON:  Portable chest x-ray obtained concurrently. FINDINGS: Motion blurs the image. The OG tube is difficult to visualize due to the motion and the technique. I believe the to the tip is at the level of the gastric cardia. External pacing pads. Visualized upper abdominal bowel gas pattern unremarkable. IMPRESSION: Motion degraded image shows the OG tube tip at the level of the gastric cardia. This should be advanced. Electronically Signed   By: Evangeline Dakin  M.D.   On: 12/18/2016 09:04    EKG: Orders placed or performed during the hospital encounter of 12/07/16  . ED EKG  . ED EKG  . EKG 12-Lead  . EKG 12-Lead  . EKG 12-Lead  . EKG 12-Lead    IMPRESSION AND PLAN:  * Ac respi failure hypoxic   Ac on ch systolic CHF   S/p intubation   Intencivist and cardio consult.    IV lasix, I/o monitor, renal func monitor,.    * Uncontrolled DM    Removed his Insuline delivery device    One dose 10 unit aspart, monitor- if need may start on insulin drip.    No DKA currently.   Hold  oral meds  * lactic acidosis    Likely due to CHF    Cont to monitor with vent support.  * Uncontrolled Htn   BP was 488 systolic, ER started on nitro drip  * hypothyroidism     Cont TSH.  * Elevated troponin   Likely coming down from NSTEMI last week.   Monitor every 4 hours.  * Ac renal failure on CKD stage 3   Monitor closely with lasix use.   If does not improve, may call nephrology.  All the records are reviewed and case discussed with ED provider. Management plans discussed with the patient, family and they are in agreement.  CODE STATUS:    Code Status Orders        Start     Ordered   12/25/2016 1035  Full code  Continuous     12/25/2016 1034    Code Status History    Date Active Date Inactive Code Status Order ID Comments User Context   12/07/2016 10:42 PM 12/13/2016  3:11 PM Full Code [id number]  Baxter Hire, MD Inpatient   11/30/2016  8:07 AM 12/02/2016  6:09 PM Full Code [id number]  Saundra Shelling, MD ED   11/27/2016 11:03 AM 11/28/2016  9:35 PM Full Code [id number]  Vaughan Basta, MD Inpatient   11/03/2016  3:31 PM 11/04/2016  8:36 PM Full Code [id number]  Demetrios Loll, MD Inpatient   08/25/2016 11:42 AM 08/25/2016  6:20 PM Full Code [id number]  Sharlotte Alamo, DPM Inpatient   05/31/2016 12:18 PM 06/02/2016  4:31 PM Full Code [id number]  Gladstone Lighter, MD ED   03/12/2016  5:57 PM 03/13/2016  7:52 PM Full Code [id number]  Nicholes Mango,  MD Inpatient   02/02/2016  1:37 AM 02/03/2016 10:08 PM Full Code [id number]  Holley Raring, NP ED   10/11/2015 12:44 PM 10/12/2015  5:30 PM Full Code [id number]  Vaughan Basta, MD Inpatient   09/24/2015  3:12 AM 09/25/2015  3:06 PM Full Code [id number]  Saundra Shelling, MD Inpatient   11/14/2014  5:07 AM 11/15/2014  4:53 PM Full Code [id number]  Harrie Foreman, MD ED    Advance Directive Documentation     Most Recent Value  Type of Advance Directive  Healthcare Power of Attorney, Living will  Pre-existing out of facility DNR order (yellow form or pink MOST form)  -  "MOST" Form in Place?  -       TOTAL TIME TAKING CARE OF THIS PATIENT: 60 critical care minutes.  Spoke with Dr. Lyndel Safe ( intencivist) and Dr. Clayborn Bigness( cardio) on phone.  Vaughan Basta M.D on 01/04/2017   Between 7am to 6pm - Pager - [phone]  After 6pm go to [email or web address] - password [email or web address] [facility]  Office  [phone]  CC: Primary care physician; Lavera Guise, MD   Note: This dictation was prepared with Dragon dictation along with smaller phrase technology. Any transcriptional errors that result from this process are unintentional.

## 2016-12-16 NOTE — Consult Note (Signed)
[place] NOTE  Pharmacy Consult for electrolytes/ vanc/zosyn Indication:    No Known Allergies  Patient Measurements: Height: 6\' 2"  (188 cm) Weight: 290 lb (131.5 kg) IBW/kg (Calculated) : 82.2 Adjusted Body Weight:   Vital Signs: Temp: 98.1 F (36.7 C) (09/01 1330) Temp Source: Axillary (09/01 0745) BP: 108/63 (09/01 1330) Pulse Rate: 56 (09/01 1330) Intake/Output from previous day: No intake/output data recorded. Intake/Output from this shift: Total I/O In: 162.9 [I.V.:107.9; IV Piggyback:55] Out: 350 [Urine:350] Vent settings for last 24 hours: Vent Mode: AC FiO2 (%):  [100 %] 100 % Set Rate:  [18 bmp] 18 bmp Vt Set:  [500 mL] 500 mL PEEP:  [10 cmH20] 10 cmH20  Labs:  Recent Labs  01/12/2017 0723  WBC 19.3*  HGB 10.4*  HCT 33.1*  PLT 560*  APTT 38*  INR 1.24  CREATININE 2.13*  ALBUMIN 3.4*  PROT 8.2*  AST 28  ALT 16*  ALKPHOS 87  BILITOT 0.8   Estimated Creatinine Clearance: 50.5 mL/min (A) (by C-G formula based on SCr of 2.13 mg/dL (H)).   Recent Labs  01/12/2017 0721 01/12/2017 1015 12/27/2016 1144  GLUCAP 434* 344* 298*    Microbiology: Recent Results (from the past 720 hour(s))  MRSA PCR Screening     Status: None   Collection Time: 11/30/16  9:06 AM  Result Value Ref Range Status   MRSA by PCR NEGATIVE NEGATIVE Final    Comment:        The GeneXpert MRSA Assay (FDA approved for NASAL specimens only), is one component of a comprehensive MRSA colonization surveillance program. It is not intended to diagnose MRSA infection nor to guide or monitor treatment for MRSA infections.   MRSA PCR Screening     Status: None   Collection Time: 12/07/16 10:39 PM  Result Value Ref Range Status   MRSA by PCR NEGATIVE NEGATIVE Final    Comment:        The GeneXpert MRSA Assay (FDA approved for NASAL specimens only), is one component of a comprehensive MRSA colonization surveillance program. It is not intended to diagnose  MRSA infection nor to guide or monitor treatment for MRSA infections.     Medications:  Scheduled:  . clopidogrel  75 mg Per NG tube Daily  . enoxaparin (LOVENOX) injection  1 mg/kg Subcutaneous Q12H  . fentaNYL (SUBLIMAZE) injection  50 mcg Intravenous Once  . furosemide  40 mg Intravenous Q12H  . insulin aspart  0-15 Units Subcutaneous Q4H  . levothyroxine  50 mcg Per NG tube QAC breakfast  . metoprolol tartrate  25 mg Per NG tube BID  . pantoprazole (PROTONIX) IV  40 mg Intravenous Q24H  . rosuvastatin  20 mg Per Tube QHS    Assessment: [name] admitted for respiratory distress. Currently intubated and found to have pulmonary edema, possible PNA. Pharmacy consulted for electrolyte managament, vanc and zosyn dosing. Pt currently receiving diuretics  Goal of Therapy:  vanc trough 15-20 normalization of electrolytes  Plan:  Zosyn 3.375g q 8hr EI dosing Vancomycin 1750mg  once Pt is currently in ARF, will therefore dose off of levels. Will check a level in 24 hours to evaluate clearance. No need to replace electrolytes currently.  Serial BMP have been ordered by provider. Next one due at 1500. Will add a Mg and Phos level to those labs as well.  Ramond Dial, Pharm.D, BCPS Clinical Pharmacist  12/19/2016,2:10 PM

## 2016-12-16 NOTE — Consult Note (Signed)
Reason for Consult:congestive heart failure respiratory failure Referring Physician: [facility]  Dennis Mullen is an 66 y.o. male.  HPI: 66 year old male bipolar anxiety coronary disease depression diabetes GERD hypertension and obstructive sleep apnea. Vascular disease congestive heart failure systolic dysfunction multiple recent admissions for heart failure or chest pain now here with respiratory failure. Patient has a history of atrial fibrillation, rapid ventricular response on amiodarone. Patient is intubated sedated now here for cardiology input and evaluation  Past Medical History:  Diagnosis Date  . Abnormal levels of other serum enzymes   . Anxiety   . Bipolar disorder ([place])   . BPH (benign prostatic hyperplasia)   . CAD (coronary artery disease)   . CHF (congestive heart failure) ([place])    ?  Marland Kitchen Depression   . Diabetes mellitus, type II ([place])   . Diabetes mellitus, type II, insulin dependent ([place])   . ED (erectile dysfunction)   . GERD (gastroesophageal reflux disease)   . Gout   . History of borderline personality disorder   . Hypertension   . Hypogonadism in male   . Hypothyroidism   . Insomnia   . Mood disorder ([place])   . Neuropathy   . Obesity   . OSA (obstructive sleep apnea)   . PVD (peripheral vascular disease) ([place])   . Thyroid disease   . Ventricular tachycardia [facility])    August 2018    Past Surgical History:  Procedure Laterality Date  . AMPUTATION TOE Left 08/25/2016   Procedure: AMPUTATION TOE/ipj left 2nd toe;  Surgeon: Sharlotte Alamo, DPM;  Location: [facility];  Service: Podiatry;  Laterality: Left;  . CARDIAC CATHETERIZATION N/A 10/12/2015   Procedure: Left Heart Cath and Coronary Angiography;  Surgeon: Teodoro Spray, MD;  Location: [place] [facility];  Service: Cardiovascular;  Laterality: N/A;  . CORONARY ANGIOPLASTY WITH STENT PLACEMENT     x 6 stents  . FOOT SURGERY Right    fracture repair with screw  . LEFT HEART CATH AND  CORONARY ANGIOGRAPHY Right 06/01/2016   Procedure: Left Heart Cath and Coronary Angiography;  Surgeon: Isaias Cowman, MD;  Location: [place] [facility];  Service: Cardiovascular;  Laterality: Right;  . LEFT HEART CATH AND CORONARY ANGIOGRAPHY N/A 11/28/2016   Procedure: LEFT HEART CATH AND CORONARY ANGIOGRAPHY and possible pci;  Surgeon: Yolonda Kida, MD;  Location: [place] [facility];  Service: Cardiovascular;  Laterality: N/A;  . MASTOIDECTOMY Right   . NASAL SEPTUM SURGERY    . penial inplant    . PENILE PROSTHESIS IMPLANT    . right mastoidectomy    . SCROTOPLASTY    . TONSILLECTOMY AND ADENOIDECTOMY      Family History  Problem Relation Age of Onset  . Lung cancer Mother   . Heart attack Father   . Post-traumatic stress disorder Father   . Anxiety disorder Father   . Depression Father   . Heart attack Sister   . Heart attack Brother   . Prostate cancer Neg Hx   . Kidney cancer Neg Hx     Social History:  reports that he has never smoked. He has never used smokeless tobacco. He reports that he does not drink alcohol or use drugs.  Allergies: No Known Allergies  Medications: I have reviewed the patient's current medications.  Results for orders placed or performed during the hospital encounter of 01/13/2017 (from the past 48 hour(s))  Glucose, capillary     Status: Abnormal  Collection Time: 01/12/2017  7:21 AM  Result Value Ref Range   Glucose-Capillary 434 (H) 65 - 99 mg/dL  Comprehensive metabolic panel     Status: Abnormal   Collection Time: 12/29/2016  7:23 AM  Result Value Ref Range   Sodium 131 (L) 135 - 145 mmol/L   Potassium 4.4 3.5 - 5.1 mmol/L   Chloride 96 (L) 101 - 111 mmol/L   CO2 23 22 - 32 mmol/L   Glucose, Bld 523 (HH) 65 - 99 mg/dL    Comment: CRITICAL RESULT CALLED TO, READ BACK BY AND VERIFIED WITH ALICIA GRENGER <[id number]>_7<\/SVXBLTJQZESPQZRA>_0  ON 01/13/2017 BY HKP    BUN 47 (H) 6 - 20 mg/dL   Creatinine, Ser 2.13 (H) 0.61 - 1.24 mg/dL   Calcium 9.2 8.9 - 10.3  mg/dL   Total Protein 8.2 (H) 6.5 - 8.1 g/dL   Albumin 3.4 (L) 3.5 - 5.0 g/dL   AST 28 15 - 41 U/L   ALT 16 (L) 17 - 63 U/L   Alkaline Phosphatase 87 38 - 126 U/L   Total Bilirubin 0.8 0.3 - 1.2 mg/dL   GFR calc non Af Amer 31 (L) >60 mL/min   GFR calc Af Amer 36 (L) >60 mL/min    Comment: (NOTE) The eGFR has been calculated using the CKD EPI equation. This calculation has not been validated in all clinical situations. eGFR's persistently <60 mL/min signify possible Chronic Kidney Disease.    Anion gap 12 5 - 15  CBC     Status: Abnormal   Collection Time: 12/17/2016  7:23 AM  Result Value Ref Range   WBC 19.3 (H) 3.8 - 10.6 K/uL   RBC 3.62 (L) 4.40 - 5.90 MIL/uL   Hemoglobin 10.4 (L) 13.0 - 18.0 g/dL   HCT 33.1 (L) 40.0 - 52.0 %   MCV 91.5 80.0 - 100.0 fL   MCH 28.8 26.0 - 34.0 pg   MCHC 31.5 (L) 32.0 - 36.0 g/dL   RDW 19.3 (H) 11.5 - 14.5 %   Platelets 560 (H) 150 - 440 K/uL  Troponin I     Status: Abnormal   Collection Time: 12/27/2016  7:23 AM  Result Value Ref Range   Troponin I 0.41 (HH) <0.03 ng/mL    Comment: CRITICAL RESULT CALLED TO, READ BACK BY AND VERIFIED WITH ALICIA TMAUQJF<[id number]>_8<\/JGOTLXBWIOMBTDHR>_4  ON 12/30/2016 BY HKP   APTT     Status: Abnormal   Collection Time: 12/17/2016  7:23 AM  Result Value Ref Range   aPTT 38 (H) 24 - 36 seconds    Comment:        IF BASELINE aPTT IS ELEVATED, SUGGEST PATIENT RISK ASSESSMENT BE USED TO DETERMINE APPROPRIATE ANTICOAGULANT THERAPY.   Protime-INR     Status: Abnormal   Collection Time: 12/30/2016  7:23 AM  Result Value Ref Range   Prothrombin Time 15.5 (H) 11.4 - 15.2 seconds   INR 1.24   Brain natriuretic peptide     Status: Abnormal   Collection Time: 12/19/2016  7:24 AM  Result Value Ref Range   B Natriuretic Peptide 2,377.0 (H) 0.0 - 100.0 pg/mL  Blood gas, arterial ([facility], [facility], [facility])     Status: Abnormal   Collection Time: 12/17/2016  7:24 AM  Result Value Ref Range   FIO2 0.50    Delivery systems BILEVEL POSITIVE AIRWAY PRESSURE    LHR  12 resp/min   Inspiratory PAP 12    Expiratory PAP 6    pH, Arterial 7.11 (LL) 7.350 -  7.450    Comment: CRITICAL RESULT CALLED TO, READ BACK BY AND VERIFIED WITH:  DR. Corky Downs @ [phone] ON 01/12/2017 BY [facility]M    pCO2 arterial 58 (H) 32.0 - 48.0 mmHg   pO2, Arterial 71 (L) 83.0 - 108.0 mmHg   Bicarbonate 18.4 (L) 20.0 - 28.0 mmol/L   Acid-base deficit 11.1 (H) 0.0 - 2.0 mmol/L   O2 Saturation 86.7 %   Patient temperature 37.0    Collection site RIGHT RADIAL    Sample type ARTERIAL DRAW    Allens test (pass/fail) PASS PASS  Lactic acid, plasma     Status: Abnormal   Collection Time: 12/21/2016  7:27 AM  Result Value Ref Range   Lactic Acid, Venous 6.8 (HH) 0.5 - 1.9 mmol/L    Comment: CRITICAL RESULT CALLED TO, READ BACK BY AND VERIFIED WITH ALICIA PZWCHEN<[id number]>_5<\/QMGQQPYPPJKDTOIZ>_1  ON 12/29/2016 BY HKP   Blood gas, arterial     Status: Abnormal   Collection Time: 12/20/2016  7:45 AM  Result Value Ref Range   FIO2 1.00    Delivery systems VENTILATOR    Mode ASSIST CONTROL    VT 500 mL   LHR 18 resp/min   Peep/cpap 10.0 cm H20   pH, Arterial 7.27 (L) 7.350 - 7.450   pCO2 arterial 59 (H) 32.0 - 48.0 mmHg   pO2, Arterial 96 83.0 - 108.0 mmHg   Bicarbonate 27.1 20.0 - 28.0 mmol/L   Acid-base deficit 0.7 0.0 - 2.0 mmol/L   O2 Saturation 96.4 %   Patient temperature 37.0    Collection site RIGHT RADIAL    Sample type ARTERIAL DRAW    Allens test (pass/fail) PASS PASS  Troponin I     Status: Abnormal   Collection Time: 01/06/2017  9:03 AM  Result Value Ref Range   Troponin I 0.46 (HH) <0.03 ng/mL    Comment: CRITICAL VALUE NOTED. VALUE IS CONSISTENT WITH PREVIOUSLY REPORTED/CALLED VALUE/HKP  Triglycerides     Status: Abnormal   Collection Time: 12/24/2016  9:03 AM  Result Value Ref Range   Triglycerides 284 (H) <150 mg/dL  Glucose, capillary     Status: Abnormal   Collection Time: 12/21/2016 10:15 AM  Result Value Ref Range   Glucose-Capillary 344 (H) 65 - 99 mg/dL  Lactic acid, plasma     Status: Abnormal    Collection Time: 01/08/2017 10:30 AM  Result Value Ref Range   Lactic Acid, Venous 2.7 (HH) 0.5 - 1.9 mmol/L    Comment: CRITICAL RESULT CALLED TO, READ BACK BY AND VERIFIED WITH MANDY MANSFIELD_1  ON 01/07/2017 BY HKP   Glucose, capillary     Status: Abnormal   Collection Time: 01/04/2017 11:44 AM  Result Value Ref Range   Glucose-Capillary 298 (H) 65 - 99 mg/dL  Lactic acid, plasma     Status: Abnormal   Collection Time: 01/03/2017 12:56 PM  Result Value Ref Range   Lactic Acid, Venous 2.4 (HH) 0.5 - 1.9 mmol/L    Comment: CRITICAL RESULT CALLED TO, READ BACK BY AND VERIFIED WITH BRENDY MANSFIELD_2  ON 01/14/2017 BY HKP   Troponin I     Status: Abnormal   Collection Time: 12/20/2016 12:56 PM  Result Value Ref Range   Troponin I 1.01 (HH) <0.03 ng/mL    Comment: CRITICAL VALUE NOTED. VALUE IS CONSISTENT WITH PREVIOUSLY REPORTED/CALLED VALUE/HKP    Dg Chest Portable 1 View  Result Date: 01/04/2017 CLINICAL DATA:  Intubation and orogastric tube placement. Follow-up pulmonary edema. EXAM: PORTABLE CHEST 1 VIEW 8:24 a.m.:  COMPARISON:  Portable chest x-ray earlier today 7:16 a.m., 12/10/2016 and earlier. FINDINGS: Endotracheal tube tip in satisfactory position approximately 5 cm above the carina. OG tube courses below the diaphragm into the stomach, faintly visible. External pacing pads are present. Cardiac silhouette mildly to moderately enlarged. Interval further worsening of interstitial and airspace pulmonary edema throughout both lungs, asymmetric increased on the right, since earlier this morning. Small bilateral pleural effusions. IMPRESSION: 1. Endotracheal tube tip in satisfactory position approximately 5 cm above carina. 2. OG tube courses below the diaphragm into the stomach. 3. Further worsening of interstitial and airspace pulmonary edema, asymmetric and increased on the right. 4. Small bilateral pleural effusions. Electronically Signed   By: Evangeline Dakin M.D.   On: 12/31/2016 09:02    Dg Chest Portable 1 View  Result Date: 12/18/2016 CLINICAL DATA:  Extra distress. EXAM: PORTABLE CHEST 1 VIEW COMPARISON:  12/10/2016 FINDINGS: Stable enlarged cardiac silhouette. There is diffuse airspace pattern suggesting combination of interstitial and pulmonary edema. No pneumothorax. No focal consolidation. IMPRESSION: Interstitial and mild pulmonary edema. Electronically Signed   By: Suzy Bouchard M.D.   On: 01/04/2017 08:04   Dg Abd Portable 1v  Result Date: 12/20/2016 CLINICAL DATA:  Repositioning of previously placed nasogastric tube at the patient bedside. EXAM: PORTABLE ABDOMEN - 1 VIEW 11:13 a.m.: COMPARISON:  Portable abdomen x-ray earlier today 8:24 a.m. FINDINGS: Nasogastric tube tip now projects over the expected location of the gastric antrum. Visualized upper abdominal bowel gas pattern unremarkable. Extensive splenic artery calcification and calcified granulomata in the spleen are noted. IMPRESSION: Nasogastric tube tip now appropriately positioned in the gastric antrum. Electronically Signed   By: Evangeline Dakin M.D.   On: 12/19/2016 13:23   Dg Abd Portable 1 View  Result Date: 12/27/2016 CLINICAL DATA:  OG tube placement. EXAM: PORTABLE ABDOMEN - 1 VIEW COMPARISON:  Portable chest x-ray obtained concurrently. FINDINGS: Motion blurs the image. The OG tube is difficult to visualize due to the motion and the technique. I believe the to the tip is at the level of the gastric cardia. External pacing pads. Visualized upper abdominal bowel gas pattern unremarkable. IMPRESSION: Motion degraded image shows the OG tube tip at the level of the gastric cardia. This should be advanced. Electronically Signed   By: Evangeline Dakin M.D.   On: 01/02/2017 09:04    Review of Systems  Unable to perform ROS: Intubated   Blood pressure 108/63, pulse (!) 56, temperature 98.1 F (36.7 C), resp. rate 20, height _0  (1.88 m), weight 131.5 kg (290 lb), SpO2 98 %. Physical Exam  Nursing note  and vitals reviewed. Constitutional: He is oriented to person, place, and time. He appears well-developed and well-nourished.  HENT:  Head: Normocephalic and atraumatic.  Eyes: Pupils are equal, round, and reactive to light. EOM are normal.  Neck: Normal range of motion. Neck supple.  Cardiovascular: Normal rate, S1 normal, S2 normal and normal pulses.  An irregularly irregular rhythm present. Exam reveals gallop and S3.   Murmur heard.  Systolic murmur is present with a grade of 2/6  Respiratory: Effort normal and breath sounds normal.  GI: Soft. Bowel sounds are normal.  Musculoskeletal: Normal range of motion.  Neurological: He is alert and oriented to person, place, and time. He has normal reflexes.  Skin: Skin is warm and dry.  Psychiatric:  Patient intubated sedated    Assessment/Plan: Intubated sedated Respiratory failure Congestive heart failure Cardiomyopathy systolic Coronary artery disease Diabetes GERD Hypertension Obstructive sleep  apnea Peripheral vascular disease Obesity . Plan Continue ventilatory support Agree with critical care management and support Continue aggressive heart failure management iV diuretic therapy Supplemental oxygen therapy Continue diabetes management and control I'm concerned about noncompliance with multiple recent admissions for heart failure unstable angina  bipolar  Dwayne D Callwood 12/29/2016, 3:25 PM

## 2016-12-16 NOTE — Progress Notes (Signed)
On arrival to unit patient's respiratory pattern labored and patient was dyssynchronous with the ventilator. Dr. Lyndel Safe notified and gave verbal order for fentanyl gtt and propofol gtt. Order for nephrology also received as verbal order. Orders placed. Patient's BP began to drop with propofol, rate decreased and Dr. Lyndel Safe notified, order received to give 500 IVF bolus, stop sedation, and hold lasix. After BP recovered patient began to wake up and reach for ETT, low dose propofol and fentanyl restarted. BP still stable, will continue to assess. Patient placed on sliding scale insulin for hyperglycemia, and home insulin pump removed.  Wilnette Kales

## 2016-12-16 NOTE — Progress Notes (Signed)
Per Dr. Lyndel Safe okay to use OG after advancement. Xray confirmed. Wilnette Kales

## 2016-12-16 NOTE — Code Documentation (Signed)
Pt intubated by Dr. Corky Downs and Larene Beach, RRT. Tube 22cm at the lip.

## 2016-12-16 NOTE — ED Provider Notes (Signed)
[facility] Emergency Department Provider Note   ____________________________________________    I have reviewed the triage vital signs and the nursing notes.   HISTORY  Chief Complaint Respiratory Distress     HPI Dennis Mullen is a 66 y.o. male who presents in respiratory distress. Patient with significant medical history as detailed below. He is unable to provide any history, CPAP in place via EMS. EMS reports that initially the patient was refusing transfer up quickly deteriorated. Review of records demonstrates patient recently discharged from our hospital after non-ST elevation MI as well as a ventricular fibrillation with cardiac arrest, initial presentation was hypoxic respiratory failure attributed to CHF.   Past Medical History:  Diagnosis Date  . Abnormal levels of other serum enzymes   . Anxiety   . Bipolar disorder ([place])   . BPH (benign prostatic hyperplasia)   . CAD (coronary artery disease)   . CHF (congestive heart failure) ([place])    ?  Marland Kitchen Depression   . Diabetes mellitus, type II ([place])   . Diabetes mellitus, type II, insulin dependent ([place])   . ED (erectile dysfunction)   . GERD (gastroesophageal reflux disease)   . Gout   . History of borderline personality disorder   . Hypertension   . Hypogonadism in male   . Hypothyroidism   . Insomnia   . Mood disorder ([place])   . Neuropathy   . Obesity   . OSA (obstructive sleep apnea)   . PVD (peripheral vascular disease) ([place])   . Thyroid disease     Patient Active Problem List   Diagnosis Date Noted  . Chronic diastolic heart failure ([place]) 12/15/2016  . Atrial fibrillation with rapid ventricular response ([place]) 12/07/2016  . Demand ischemia ([place]) 12/02/2016  . Moderate aortic stenosis 12/02/2016  . Hyperkalemia 12/02/2016  . Non-STEMI (non-ST elevated myocardial infarction) ([place]) 11/30/2016  . Lymphedema 03/30/2016  . Left leg cellulitis 03/12/2016  . Insulin overdose  02/02/2016  . Hypoglycemia   . OSA on CPAP   . Depression   . Hypophosphatemia   . SOB (shortness of breath) 10/11/2015  . Elevated troponin 10/11/2015  . Chest pain at rest 09/24/2015  . Elevated PSA 07/26/2015  . Hypogonadism in male 07/26/2015  . 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 BPH with obstruction/lower urinary tract symptoms 06/29/2015  . Bipolar 1 disorder, mixed ([place]) 04/14/2015  . Borderline personality disorder 04/14/2015  . Acute renal failure ([place]) 11/15/2014  . Chest pain 11/14/2014  . Dizziness 11/05/2014  . Benign essential HTN 11/04/2014  . Combined fat and carbohydrate induced hyperlipemia 11/04/2014  . Bipolar 2 disorder ([place]) 10/27/2014  . Acquired hypothyroidism 02/03/2014  . Type 2 diabetes mellitus ([place]) 02/03/2014  . Adiposity 01/06/2014  . Obesity, diabetes, and hypertension syndrome ([place]) 12/23/2013  . Long term current use of insulin ([place]) 12/23/2013  . Type 2 diabetes mellitus with other diabetic neurological complication ([place]) 41/66/0630  . Disorder affecting the body's metabolism 12/23/2013  . Elevated cholesterol with elevated triglycerides 11/18/2013  . CAD (coronary artery disease), native coronary artery 09/26/2013  . Diabetes mellitus ([place]) 09/26/2013  . Non-ST elevation (NSTEMI) myocardial infarction [facility]) 09/26/2013    Past Surgical History:  Procedure Laterality Date  . AMPUTATION TOE Left 08/25/2016   Procedure: AMPUTATION TOE/ipj left 2nd toe;  Surgeon: Sharlotte Alamo, DPM;  Location: [facility] ORS;  Service: Podiatry;  Laterality: Left;  . CARDIAC CATHETERIZATION N/A 10/12/2015   Procedure: Left Heart Cath and Coronary Angiography;  Surgeon: Teodoro Spray, MD;  Location: [place] [facility];  Service: Cardiovascular;  Laterality: N/A;  . CORONARY ANGIOPLASTY WITH STENT PLACEMENT     x 6 stents  . FOOT SURGERY Right    fracture repair with screw  . LEFT HEART CATH AND CORONARY ANGIOGRAPHY Right 06/01/2016   Procedure: Left  Heart Cath and Coronary Angiography;  Surgeon: Isaias Cowman, MD;  Location: [place] [facility];  Service: Cardiovascular;  Laterality: Right;  . LEFT HEART CATH AND CORONARY ANGIOGRAPHY N/A 11/28/2016   Procedure: LEFT HEART CATH AND CORONARY ANGIOGRAPHY and possible pci;  Surgeon: Yolonda Kida, MD;  Location: [place] [facility];  Service: Cardiovascular;  Laterality: N/A;  . MASTOIDECTOMY Right   . NASAL SEPTUM SURGERY    . penial inplant    . PENILE PROSTHESIS IMPLANT    . right mastoidectomy    . SCROTOPLASTY    . TONSILLECTOMY AND ADENOIDECTOMY      Prior to Admission medications   Medication Sig Start Date End Date Taking? Authorizing Provider  allopurinol (ZYLOPRIM) 100 MG tablet Take 100 mg by mouth daily.    Yes [provider]  amiodarone (PACERONE) 400 MG tablet Take 1 tablet (400 mg total) by mouth daily. 12/13/16  Yes Sudini, Alveta Heimlich, MD  apixaban (ELIQUIS) 5 MG TABS tablet Take 1 tablet (5 mg total) by mouth 2 (two) times daily. 06/02/16  Yes Sudini, Alveta Heimlich, MD  clopidogrel (PLAVIX) 75 MG tablet Take 75 mg by mouth daily. Reported on 07/26/2015   Yes [provider]  colchicine 0.6 MG tablet Take 0.6 mg by mouth 2 (two) times daily as needed (gout flare).  11/08/15  Yes [provider]  Continuous Blood Gluc Sensor (FREESTYLE LIBRE SENSOR SYSTEM) MISC Use 1 each as directed. Please place prior to office visit as directed 06/20/16  Yes [provider]  diclofenac (VOLTAREN) 75 MG EC tablet Take 75 mg by mouth daily as needed (gout flare).  12/09/15  Yes [provider]  divalproex (DEPAKOTE ER) 500 MG 24 hr tablet Take 1-2 tablets (500-1,000 mg total) by mouth 2 (two) times daily. Take 571m in morning and 10046min evening 08/24/16  Yes Clapacs, JoMadie RenoMD  DULoxetine (CYMBALTA) 60 MG capsule Take 1 capsule (60 mg total) by mouth daily. 08/24/16  Yes Clapacs, JoMadie RenoMD  esomeprazole (NEXIUM) 40 MG capsule Take 40 mg by mouth  daily.  10/14/14  Yes [provider]  fenofibrate (TRICOR) 145 MG tablet Take 145 mg by mouth daily.  12/23/15 12/22/16 Yes [provider]  furosemide (LASIX) 40 MG tablet Take 1 tablet (40 mg total) by mouth daily. 12/07/16  Yes StCarrie MewMD  gemfibrozil (LOPID) 600 MG tablet gemfibrozil 600 mg tablet   Yes [provider]  glucose 4 GM chewable tablet Chew 4 g by mouth as needed for low blood sugar.    Yes [provider]  Insulin Disposable Pump (V-GO 30) KIT Inject 1.2 Units/hr into the skin every hour. 66 units/24 hrs (36 available bolus units--12 bolus units available per meals) 10/07/14  Yes [provider]  insulin lispro (HUMALOG KWIKPEN) 100 UNIT/ML KiwkPen Inject into the skin See admin instructions. Used ONLY as needed if additional bolus units are needed (up to 36 units/24 hrs.)   Yes [provider]  isosorbide mononitrate (IMDUR) 60 MG 24 hr tablet Take 60 mg by mouth daily.  05/09/16 05/09/17 Yes [provider]  JUBLIA 10 % SOLN Apply  1 application topically daily. 10/31/16  Yes [provider]  levothyroxine (SYNTHROID, LEVOTHROID) 50 MCG tablet Take 50 mcg by mouth daily before breakfast.   Yes [provider]  metFORMIN (GLUCOPHAGE) 1000 MG tablet Take 1,000 mg by mouth 2 (two) times daily with a meal. Morning & supper   Yes [provider]  metoprolol succinate (TOPROL-XL) 50 MG 24 hr tablet Take 50 mg by mouth daily.  09/11/14  Yes [provider]  nitroGLYCERIN (NITROSTAT) 0.4 MG SL tablet Place 0.4 mg under the tongue every 5 (five) minutes x 3 doses as needed for chest pain.    Yes [provider]  pregabalin (LYRICA) 100 MG capsule Take 100 mg by mouth 3 (three) times daily before meals.    Yes [provider]  rOPINIRole (REQUIP) 2 MG tablet Take 2 mg by mouth 2 (two) times daily.  04/27/15  Yes [provider]  rosuvastatin (CRESTOR) 20 MG tablet  Take 20 mg by mouth at bedtime.    Yes [provider]  VICTOZA 18 MG/3ML SOPN Inject 1.8 mg into the skin daily.  09/07/14  Yes [provider]  vitamin C (ASCORBIC ACID) 500 MG tablet Take 500 mg by mouth daily.   Yes [provider]     Allergies Patient has no known allergies.  Family History  Problem Relation Age of Onset  . Lung cancer Mother   . Heart attack Father   . Post-traumatic stress disorder Father   . Anxiety disorder Father   . Depression Father   . Heart attack Sister   . Heart attack Brother   . Prostate cancer Neg Hx   . Kidney cancer Neg Hx     Social History Social History  Substance Use Topics  . Smoking status: Never Smoker  . Smokeless tobacco: Never Used  . Alcohol use No    Level V caveat: Unable to obtain Review of Systems due to patient distress    ____________________________________________   PHYSICAL EXAM:  VITAL SIGNS: ED Triage Vitals  Enc Vitals Group     BP --      Pulse Rate 12/25/2016 0720 77     Resp 12/21/2016 0720 (!) 28     Temp --      Temp src --      SpO2 12/29/2016 0720 90 %     Weight 12/27/2016 0721 131.5 kg (290 lb)     Height 12/23/2016 0721 1.88 m (6' 2" )     Head Circumference --      Peak Flow --      Pain Score --      Pain Loc --      Pain Edu? --      Excl. in [place]? --     Constitutional: Arousable and follows basic commands Eyes: Conjunctivae are normal.  Head: Atraumatic. Nose: No trauma Mouth/Throat: CPAP in place  Cardiovascular: Tachycardia, regular rhythm. Kermit Balo peripheral circulation. Respiratory: Tachypnea. Diffuse rales Gastrointestinal: Soft and nontender. No distention.   Genitourinary: deferred Musculoskeletal: Initially, mildly cyanotic fingernails improved with BiPAP. Mild lower extremity edema Neurologic:   No gross focal neurologic deficits are appreciated.  Skin:  Skin is warm, dry and intact. No rash noted. Psychiatric: Unable to examine due to patient  distress  ____________________________________________   LABS (all labs ordered are listed, but only abnormal results are displayed)  Labs Reviewed  COMPREHENSIVE METABOLIC PANEL - Abnormal; Notable for the following:  Result Value   Sodium 131 (*)    Chloride 96 (*)    Glucose, Bld 523 (*)    BUN 47 (*)    Creatinine, Ser 2.13 (*)    Total Protein 8.2 (*)    Albumin 3.4 (*)    ALT 16 (*)    GFR calc non Af Amer 31 (*)    GFR calc Af Amer 36 (*)    All other components within normal limits  CBC - Abnormal; Notable for the following:    WBC 19.3 (*)    RBC 3.62 (*)    Hemoglobin 10.4 (*)    HCT 33.1 (*)    MCHC 31.5 (*)    RDW 19.3 (*)    Platelets 560 (*)    All other components within normal limits  TROPONIN I - Abnormal; Notable for the following:    Troponin I 0.41 (*)    All other components within normal limits  BRAIN NATRIURETIC PEPTIDE - Abnormal; Notable for the following:    B Natriuretic Peptide 2,377.0 (*)    All other components within normal limits  BLOOD GAS, ARTERIAL - Abnormal; Notable for the following:    pH, Arterial 7.11 (*)    pCO2 arterial 58 (*)    pO2, Arterial 71 (*)    Bicarbonate 18.4 (*)    Acid-base deficit 11.1 (*)    All other components within normal limits  APTT - Abnormal; Notable for the following:    aPTT 38 (*)    All other components within normal limits  PROTIME-INR - Abnormal; Notable for the following:    Prothrombin Time 15.5 (*)    All other components within normal limits  LACTIC ACID, PLASMA - Abnormal; Notable for the following:    Lactic Acid, Venous 6.8 (*)    All other components within normal limits  GLUCOSE, CAPILLARY - Abnormal; Notable for the following:    Glucose-Capillary 434 (*)    All other components within normal limits  CULTURE, BLOOD (ROUTINE X 2)  CULTURE, BLOOD (ROUTINE X 2)  LACTIC ACID, PLASMA  BLOOD GAS, ARTERIAL   ____________________________________________  EKG  ED ECG REPORT I,  Lavonia Drafts, the attending physician, personally viewed and interpreted this ECG.  Date: 12/22/2016 EKG Time: 7:21 AM Rate: 79 Rhythm: normal sinus rhythm QRS Axis: normal Intervals: normal ST/T Wave abnormalities: Significant ST depressions inferiorly and laterally   ____________________________________________  RADIOLOGY  Chest x-ray consistent with pulmonary edema ____________________________________________   PROCEDURES  Procedure(s) performed: yes  INTUBATION Performed by: Lavonia Drafts  Required items: required blood products, implants, devices, and special equipment available Patient identity confirmed: provided demographic data and hospital-assigned identification number Time out: Immediately prior to procedure a "time out" was called to verify the correct patient, procedure, equipment, support staff and site/side marked as required.  Indications: respiratory failure  Intubation method: Glidescope Laryngoscopy   Preoxygenation: BVM  Sedatives: 20Etomidate Paralytic: 120Succinylcholine  Tube Size: 8.0 cuffed  Post-procedure assessment: chest rise and ETCO2 monitor Breath sounds: equal and absent over the epigastrium Tube secured with: ETT holder Chest x-ray interpreted by radiologist and me.  Chest x-ray findings: endotracheal tube in appropriate position  Patient tolerated the procedure well with no immediate complications.       Critical Care performed: yes  CRITICAL CARE Performed by: Lavonia Drafts   Total critical care time: 40 minutes  Critical care time was exclusive of separately billable procedures and treating other patients.  Critical care was necessary to treat  or prevent imminent or life-threatening deterioration.  Critical care was time spent personally by me on the following activities: development of treatment plan with patient and/or surrogate as well as nursing, discussions with consultants, evaluation of patient's  response to treatment, examination of patient, obtaining history from patient or surrogate, ordering and performing treatments and interventions, ordering and review of laboratory studies, ordering and review of radiographic studies, pulse oximetry and re-evaluation of patient's condition.  ____________________________________________   INITIAL IMPRESSION / ASSESSMENT AND PLAN / ED COURSE  Pertinent labs & imaging results that were available during my care of the patient were reviewed by me and considered in my medical decision making (see chart for details).  Patient presents with respiratory failure. EMS arrived with patient on CPAP, quickly transitioned to BiPAP which appeared to help clinically. Finger nail cyanosis improved rapidly after transitioned to BiPAP. Diffuse rales on exam consistent with CHF exacerbation. No history of COPD. 0.625 of Vasotec given for afterload reduction, Nitropaste for preload reduction and Lasix given as well. Initial ABG done immediately upon arrival 7.11 with PaO2 of 71, we will recheck this in 20 minutes to see if the BiPAP was helping, if not will consider intubation.   The patient is noted to have a lactate>4. With the current information available to me, I don't think the patient is in septic shock. The lactate>4, is related to respiratory distress/ respiratory failure-.  ----------------------------------------- 8:28 AM on 01/13/2017 ----------------------------------------- Patient was clinically deteriorating and decision made to intubate. All airway adjuncts were available as anticipated difficult airway. First pass with a glydescope, 8.0 tube.   I'll start the patient on a nitroglycerin drip and remove the Nitropaste. Propofol drip for sedation. Admitted to hospitalist Dr. Marthann Schiller      ____________________________________________   FINAL CLINICAL IMPRESSION(S) / ED DIAGNOSES  Final diagnoses:  Acute respiratory failure with hypoxia ([place])    Acute pulmonary edema ([facility])  Hyperglycemia      NEW MEDICATIONS STARTED DURING THIS VISIT:  New Prescriptions   No medications on file     Note:  This document was prepared using Dragon voice recognition software and may include unintentional dictation errors.    Lavonia Drafts, MD 01/10/2017 [phone]

## 2016-12-17 ENCOUNTER — Inpatient Hospital Stay: Payer: Medicare Other

## 2016-12-17 LAB — GLUCOSE, CAPILLARY
GLUCOSE-CAPILLARY: 102 mg/dL — AB (ref 65–99)
GLUCOSE-CAPILLARY: 133 mg/dL — AB (ref 65–99)
GLUCOSE-CAPILLARY: 134 mg/dL — AB (ref 65–99)
GLUCOSE-CAPILLARY: 145 mg/dL — AB (ref 65–99)
Glucose-Capillary: 116 mg/dL — ABNORMAL HIGH (ref 65–99)
Glucose-Capillary: 159 mg/dL — ABNORMAL HIGH (ref 65–99)

## 2016-12-17 LAB — BASIC METABOLIC PANEL
Anion gap: 9 (ref 5–15)
BUN: 43 mg/dL — ABNORMAL HIGH (ref 6–20)
CHLORIDE: 101 mmol/L (ref 101–111)
CO2: 28 mmol/L (ref 22–32)
CREATININE: 1.92 mg/dL — AB (ref 0.61–1.24)
Calcium: 8.6 mg/dL — ABNORMAL LOW (ref 8.9–10.3)
GFR calc non Af Amer: 35 mL/min — ABNORMAL LOW (ref >60)
GFR, EST AFRICAN AMERICAN: 41 mL/min — AB (ref >60)
GLUCOSE: 111 mg/dL — AB (ref 65–99)
Potassium: 4.4 mmol/L (ref 3.5–5.1)
Sodium: 138 mmol/L (ref 135–145)

## 2016-12-17 LAB — CBC
HCT: 30.1 % — ABNORMAL LOW (ref 40.0–52.0)
Hemoglobin: 9.7 g/dL — ABNORMAL LOW (ref 13.0–18.0)
MCH: 27.8 pg (ref 26.0–34.0)
MCHC: 32.3 g/dL (ref 32.0–36.0)
MCV: 86.2 fL (ref 80.0–100.0)
PLATELETS: 375 10*3/uL (ref 150–440)
RBC: 3.49 MIL/uL — AB (ref 4.40–5.90)
RDW: 17.6 % — ABNORMAL HIGH (ref 11.5–14.5)
WBC: 12.2 10*3/uL — ABNORMAL HIGH (ref 3.8–10.6)

## 2016-12-17 LAB — BLOOD GAS, ARTERIAL
ACID-BASE EXCESS: 3.7 mmol/L — AB (ref 0.0–2.0)
Bicarbonate: 29.1 mmol/L — ABNORMAL HIGH (ref 20.0–28.0)
FIO2: 0.6
LHR: 18 {breaths}/min
Mechanical Rate: 18
O2 Saturation: 93 %
PCO2 ART: 47 mmHg (ref 32.0–48.0)
PEEP: 5 cmH2O
Patient temperature: 37
VT: 500 mL
pH, Arterial: 7.4 (ref 7.350–7.450)
pO2, Arterial: 67 mmHg — ABNORMAL LOW (ref 83.0–108.0)

## 2016-12-17 LAB — PROCALCITONIN: PROCALCITONIN: 0.86 ng/mL

## 2016-12-17 LAB — TROPONIN I: TROPONIN I: 1.35 ng/mL — AB (ref <0.03)

## 2016-12-17 LAB — VANCOMYCIN, RANDOM: Vancomycin Rm: 10

## 2016-12-17 MED ORDER — ACETAMINOPHEN 325 MG PO TABS
650.0000 mg | ORAL_TABLET | Freq: Four times a day (QID) | ORAL | Status: DC | PRN
  Administered 2016-12-17 – 2016-12-25 (×11): 650 mg via ORAL
  Filled 2016-12-17 (×11): qty 2

## 2016-12-17 MED ORDER — SODIUM CHLORIDE 0.9 % IV SOLN
1750.0000 mg | Freq: Once | INTRAVENOUS | Status: DC
  Filled 2016-12-17: qty 1750

## 2016-12-17 MED ORDER — VANCOMYCIN HCL 10 G IV SOLR
1750.0000 mg | Freq: Once | INTRAVENOUS | Status: DC
  Filled 2016-12-17: qty 1750

## 2016-12-17 MED ORDER — AMIODARONE HCL 200 MG PO TABS
400.0000 mg | ORAL_TABLET | Freq: Every day | ORAL | Status: DC
  Administered 2016-12-17: 400 mg via NASOGASTRIC
  Filled 2016-12-17: qty 2

## 2016-12-17 MED ORDER — AMIODARONE HCL 200 MG PO TABS
200.0000 mg | ORAL_TABLET | Freq: Two times a day (BID) | ORAL | Status: DC
  Administered 2016-12-17: 200 mg via NASOGASTRIC
  Filled 2016-12-17: qty 1

## 2016-12-17 NOTE — Progress Notes (Addendum)
Inpatient Diabetes Program Recommendations  AACE/ADA: New Consensus Statement on Inpatient Glycemic Control (2015)  Target Ranges:  Prepandial:   less than 140 mg/dL      Peak postprandial:   less than 180 mg/dL (1-2 hours)      Critically ill patients:  140 - 180 mg/dL   Lab Results  Component Value Date   GLUCAP 102 (H) 12/17/2016   HGBA1C 7.3 (H) 11/30/2016    Review of Glycemic Control  Diabetes history: Type 2 DM Outpatient Diabetes medications: VGO 30 Disposable Insulin Pump (provides patient 30 units basal insulin per 24 hour period and patient takes 6 "clicks" that total 12 units bolus insulin per meal) Victoza 1.8 mg daily Metformin 1000 mg BID  Current orders for Inpatient glycemic control:  Novolog moderate q 4 hrs  Inpatient Diabetes Program Recommendations:   When stable, consider adding Lantus 15 units daily (this is approximately 1/2 of home dose of basal insulin).  Will follow.  Thank you, Nani Gasser. Aleen Marston, RN, MSN, CDE  Diabetes Coordinator Inpatient Glycemic Control Team Team Pager [phone] (8am-5pm) 12/17/2016 7:25 AM

## 2016-12-17 NOTE — Progress Notes (Signed)
[facility] NAME: Dennis Mullen    MR#:  [id number]  DATE OF BIRTH:  1950/09/08  SUBJECTIVE:  CHIEF COMPLAINT:   Chief Complaint  Patient presents with  . Respiratory Distress     Came with respi distress and intubated, have CHF, renal failure, acidosis.    Remains on vent.  REVIEW OF SYSTEMS:  Intubated, so can not give much details, sedated. ROS  DRUG ALLERGIES:  No Known Allergies  VITALS:  Blood pressure 93/64, pulse (!) 112, temperature 100.2 F (37.9 C), resp. rate 11, height 6\' 2"  (1.88 m), weight 131.5 kg (290 lb), SpO2 93 %.  PHYSICAL EXAMINATION:    GENERAL:  66 y.o.-year-old patient lying in the bed with acute distress, critical appearing. EYES: Pupils equal, round, reactive to light. No scleral icterus. Extraocular muscles did not check, pt is sedated.  HEENT: Head atraumatic, normocephalic. Oropharynx and nasopharynx clear.  NECK:  Supple, no jugular venous distention. No thyroid enlargement, no tenderness.  LUNGS: Normal breath sounds bilaterally, no wheezing, b/l crepitation. No use of accessory muscles of respiration. On vent support via ET tube. CARDIOVASCULAR: S1, S2 normal. No murmurs, rubs, or gallops.  ABDOMEN: Soft, nontender, nondistended. Bowel sounds present. No organomegaly or mass.  EXTREMITIES: No pedal edema, cyanosis, or clubbing.  NEUROLOGIC: pt is on vent support, sedated. PSYCHIATRIC: sedated on vent.  SKIN: No obvious rash, lesion, or ulcer.   Physical Exam LABORATORY PANEL:   CBC  Recent Labs Lab 12/17/16 0305  WBC 12.2*  HGB 9.7*  HCT 30.1*  PLT 375   ------------------------------------------------------------------------------------------------------------------  Chemistries   Recent Labs Lab 12/29/2016 0723 12/19/2016 1447  12/17/16 0305  NA 131* 135  < > 138  K 4.4 4.6  < > 4.4  CL 96* 100*  < > 101  CO2 23 25  < > 28  GLUCOSE 523* 199*  < > 111*  BUN 47* 51*  < >  43*  CREATININE 2.13* 2.16*  < > 1.92*  CALCIUM 9.2 8.5*  < > 8.6*  MG  --  2.1  --   --   AST 28  --   --   --   ALT 16*  --   --   --   ALKPHOS 87  --   --   --   BILITOT 0.8  --   --   --   < > = values in this interval not displayed. ------------------------------------------------------------------------------------------------------------------  Cardiac Enzymes  Recent Labs Lab 01/13/2017 2116 12/17/16 0305  TROPONINI 1.25* 1.35*   ------------------------------------------------------------------------------------------------------------------  RADIOLOGY:  [place] Retroperitoneal (renal,aorta,ivc Nodes)  Result Date: 01/14/2017 CLINICAL DATA:  Acute renal failure EXAM: RENAL / URINARY TRACT ULTRASOUND COMPLETE COMPARISON:  09/29/2013 FINDINGS: Right Kidney: Length: 11.1 cm. Echogenicity within normal limits. No mass or hydronephrosis visualized. Left Kidney: Length: 11.1 cm. Echogenicity within normal limits. No mass or hydronephrosis visualized. Bladder: Collapsed by Foley catheter.  Limited assessment. IMPRESSION: No acute finding by renal ultrasound.  Negative for hydronephrosis. Electronically Signed   By: Jerilynn Mages.  Shick M.D.   On: 12/18/2016 15:30   Dg Chest Port 1 View  Result Date: 12/17/2016 CLINICAL DATA:  Intubation, CHF, renal failure EXAM: PORTABLE CHEST 1 VIEW COMPARISON:  12/28/2016 FINDINGS: Endotracheal tube is 2.6 cm above the trachea. NG tube enters the stomach with the tip not visualized. Stable cardiomegaly with improvement in the pulmonary edema pattern compared to yesterday. Posterior layering pleural effusions noted bilaterally. No pneumothorax. IMPRESSION:  Improving CHF pattern. Electronically Signed   By: Jerilynn Mages.  Shick M.D.   On: 12/17/2016 10:13   Dg Chest Portable 1 View  Result Date: 01/03/2017 CLINICAL DATA:  Intubation and orogastric tube placement. Follow-up pulmonary edema. EXAM: PORTABLE CHEST 1 VIEW 8:24 a.m.: COMPARISON:  Portable chest x-ray earlier today 7:16  a.m., 12/10/2016 and earlier. FINDINGS: Endotracheal tube tip in satisfactory position approximately 5 cm above the carina. OG tube courses below the diaphragm into the stomach, faintly visible. External pacing pads are present. Cardiac silhouette mildly to moderately enlarged. Interval further worsening of interstitial and airspace pulmonary edema throughout both lungs, asymmetric increased on the right, since earlier this morning. Small bilateral pleural effusions. IMPRESSION: 1. Endotracheal tube tip in satisfactory position approximately 5 cm above carina. 2. OG tube courses below the diaphragm into the stomach. 3. Further worsening of interstitial and airspace pulmonary edema, asymmetric and increased on the right. 4. Small bilateral pleural effusions. Electronically Signed   By: Evangeline Dakin M.D.   On: 01/09/2017 09:02   Dg Chest Portable 1 View  Result Date: 12/22/2016 CLINICAL DATA:  Extra distress. EXAM: PORTABLE CHEST 1 VIEW COMPARISON:  12/10/2016 FINDINGS: Stable enlarged cardiac silhouette. There is diffuse airspace pattern suggesting combination of interstitial and pulmonary edema. No pneumothorax. No focal consolidation. IMPRESSION: Interstitial and mild pulmonary edema. Electronically Signed   By: Suzy Bouchard M.D.   On: 01/11/2017 08:04   Dg Abd Portable 1v  Result Date: 01/07/2017 CLINICAL DATA:  Repositioning of previously placed nasogastric tube at the patient bedside. EXAM: PORTABLE ABDOMEN - 1 VIEW 11:13 a.m.: COMPARISON:  Portable abdomen x-ray earlier today 8:24 a.m. FINDINGS: Nasogastric tube tip now projects over the expected location of the gastric antrum. Visualized upper abdominal bowel gas pattern unremarkable. Extensive splenic artery calcification and calcified granulomata in the spleen are noted. IMPRESSION: Nasogastric tube tip now appropriately positioned in the gastric antrum. Electronically Signed   By: Evangeline Dakin M.D.   On: 12/22/2016 13:23   Dg Abd  Portable 1 View  Result Date: 12/18/2016 CLINICAL DATA:  OG tube placement. EXAM: PORTABLE ABDOMEN - 1 VIEW COMPARISON:  Portable chest x-ray obtained concurrently. FINDINGS: Motion blurs the image. The OG tube is difficult to visualize due to the motion and the technique. I believe the to the tip is at the level of the gastric cardia. External pacing pads. Visualized upper abdominal bowel gas pattern unremarkable. IMPRESSION: Motion degraded image shows the OG tube tip at the level of the gastric cardia. This should be advanced. Electronically Signed   By: Evangeline Dakin M.D.   On: 12/20/2016 09:04    ASSESSMENT AND PLAN:   Active Problems:   Acute respiratory failure with hypoxia (HCC)   Acute on chronic systolic CHF (congestive heart failure) (HCC)   Acute respiratory failure (HCC)  * Ac respi failure hypoxic   Ac on ch systolic CHF   S/p intubation   Intencivist and cardio consult.    IV lasix, I/o monitor, renal func monitor,.   Appreciated ICU physician help, added vanc + zosyn, will check his procalcitonin to guide Abx therapy.    * Uncontrolled DM    Removed his Insuline delivery device    on sliding scale coverage now.   No DKA currently.   Hold oral meds  * lactic acidosis    Likely due to CHF    Cont to monitor with vent support.   Improved.  * Uncontrolled Htn   BP was 329 systolic, ER  started on nitro drip    Had drop in BP, so stopped. Now stable.  * hypothyroidism     Cont TSH.  * Elevated troponin   Likely coming down from NSTEMI last week.   Monitor every 4 hours.    Now rising some, appreciated cardio help, no further intervention.  * Ac renal failure on CKD stage 3   Monitor closely with lasix use.   Appreciated help bynephrology.   * CAD   Cont asa, plavix, metoprolol, rosuvastatin  *  A fib   COnt metoprolol and amiodarone.   Eliquis changed to lovenox [place] for now.    All the records are reviewed and case discussed with Care  Management/Social Workerr. Management plans discussed with the patient, family and they are in agreement.  CODE STATUS: Full.  TOTAL TIME TAKING CARE OF THIS PATIENT: 35 minutes.     POSSIBLE D/C IN 1-2 DAYS, DEPENDING ON CLINICAL CONDITION.   Vaughan Basta M.D on 12/17/2016   Between 7am to 6pm - Pager - [phone]  After 6pm go to [email or web address] - password [email or web address] [facility]  Office  [phone]  CC: Primary care physician; Lavera Guise, MD  Note: This dictation was prepared with Dragon dictation along with smaller phrase technology. Any transcriptional errors that result from this process are unintentional.

## 2016-12-17 NOTE — Progress Notes (Signed)
[facility]  ROUNDING NOTE   Subjective:  Renal function currently about the same. Patient more tachycardic this a.m. Urine output 1.9 L over the preceding 24 hours.   Objective:  Vital signs in last 24 hours:  Temp:  [97.5 F (36.4 C)-100.8 F (38.2 C)] 100.2 F (37.9 C) (09/02 1000) Pulse Rate:  [52-118] 112 (09/02 1100) Resp:  [8-25] 11 (09/02 1100) BP: (72-149)/(51-105) 93/64 (09/02 1100) SpO2:  [93 %-100 %] 93 % (09/02 1100) FiO2 (%):  [40 %-60 %] 40 % (09/02 0900)  Weight change:  Filed Weights   01/04/2017 0721  Weight: 131.5 kg (290 lb)    Intake/Output: I/O last 3 completed shifts: In: 1012.3 [I.V.:252.3; IV Piggyback:760] Out: 1910 [Urine:1910]   Intake/Output this shift:  Total I/O In: 138.2 [I.V.:38.2; Other:50; IV Piggyback:50] Out: 275 [Urine:275]  Physical Exam: General: Critically ill appearing   Head: Endotracheal tube in place   Eyes: closed  Neck: Supple, trachea midline  Lungs:  Scattered rhonchi, vent supported   Heart: S1S2 Tachycardic, irregular   Abdomen:  Soft, nontender, bowel sounds present  Extremities: 2+ peripheral edema.  Neurologic: Intubated, not following commands   Skin: No lesions       Basic Metabolic Panel:  Recent Labs Lab 12/10/16 1906 12/12/16 0428 01/09/2017 0723 01/11/2017 1447 01/07/2017 2116 12/17/16 0305  NA 134* 137 131* 135 136 138  K 4.4 4.1 4.4 4.6 4.4 4.4  CL 99* 100* 96* 100* 100* 101  CO2 _0 GLUCOSE 243* 191* 523* 199* 133* 111*  BUN 27* 27* 47* 51* 46* 43*  CREATININE 1.62* 1.28* 2.13* 2.16* 1.94* 1.92*  CALCIUM 9.1 9.2 9.2 8.5* 8.8* 8.6*  MG 2.0  --   --  2.1  --   --   PHOS  --   --   --  5.9*  --   --     Liver Function Tests:  Recent Labs Lab 01/14/2017 0723  AST 28  ALT 16*  ALKPHOS 87  BILITOT 0.8  PROT 8.2*  ALBUMIN 3.4*   No results for input(s): LIPASE, AMYLASE in the last 168 hours. No results for input(s): AMMONIA in the last 168  hours.  CBC:  Recent Labs Lab 12/11/16 0424 12/12/16 0428 12/23/2016 0723 12/17/16 0305  WBC 7.5 8.2 19.3* 12.2*  HGB 10.0* 10.4* 10.4* 9.7*  HCT 29.9* 31.3* 33.1* 30.1*  MCV 85.7 85.3 91.5 86.2  PLT 346 395 560* 375    Cardiac Enzymes:  Recent Labs Lab 12/31/2016 0903 01/04/2017 1256 12/17/2016 1637 12/17/2016 2116 12/17/16 0305  TROPONINI 0.46* 1.01* 1.20* 1.25* 1.35*    BNP: Invalid input(s): POCBNP  CBG:  Recent Labs Lab 01/05/2017 1610 12/20/2016 2000 12/17/16 0004 12/17/16 0425 12/17/16 0752  GLUCAP 178* 138* 133* 102* 116*    Microbiology: Results for orders placed or performed during the hospital encounter of 12/27/2016  Blood culture (routine x 2)     Status: None (Preliminary result)   Collection Time: 01/07/2017  7:24 AM  Result Value Ref Range Status   Specimen Description BLOOD RIGHT ANTECUBITAL  Final   Special Requests   Final    BOTTLES DRAWN AEROBIC AND ANAEROBIC Blood Culture adequate volume   Culture NO GROWTH < 24 HOURS  Final   Report Status PENDING  Incomplete  Blood culture (routine x 2)     Status: None (Preliminary result)   Collection Time: 01/02/2017  7:29 AM  Result Value Ref Range Status  Specimen Description BLOOD LEFT ANTECUBITAL  Final   Special Requests   Final    BOTTLES DRAWN AEROBIC AND ANAEROBIC Blood Culture adequate volume   Culture NO GROWTH < 24 HOURS  Final   Report Status PENDING  Incomplete    Coagulation Studies:  Recent Labs  01/02/2017 0723  LABPROT 15.5*  INR 1.24    Urinalysis: No results for input(s): COLORURINE, LABSPEC, PHURINE, GLUCOSEU, HGBUR, BILIRUBINUR, KETONESUR, PROTEINUR, UROBILINOGEN, NITRITE, LEUKOCYTESUR in the last 72 hours.  Invalid input(s): APPERANCEUR    Imaging: [place] Retroperitoneal (renal,aorta,ivc Nodes)  Result Date: 01/11/2017 CLINICAL DATA:  Acute renal failure EXAM: RENAL / URINARY TRACT ULTRASOUND COMPLETE COMPARISON:  09/29/2013 FINDINGS: Right Kidney: Length: 11.1 cm. Echogenicity  within normal limits. No mass or hydronephrosis visualized. Left Kidney: Length: 11.1 cm. Echogenicity within normal limits. No mass or hydronephrosis visualized. Bladder: Collapsed by Foley catheter.  Limited assessment. IMPRESSION: No acute finding by renal ultrasound.  Negative for hydronephrosis. Electronically Signed   By: Jerilynn Mages.  Shick M.D.   On: 12/19/2016 15:30   Dg Chest Port 1 View  Result Date: 12/17/2016 CLINICAL DATA:  Intubation, CHF, renal failure EXAM: PORTABLE CHEST 1 VIEW COMPARISON:  01/04/2017 FINDINGS: Endotracheal tube is 2.6 cm above the trachea. NG tube enters the stomach with the tip not visualized. Stable cardiomegaly with improvement in the pulmonary edema pattern compared to yesterday. Posterior layering pleural effusions noted bilaterally. No pneumothorax. IMPRESSION: Improving CHF pattern. Electronically Signed   By: Jerilynn Mages.  Shick M.D.   On: 12/17/2016 10:13   Dg Chest Portable 1 View  Result Date: 01/02/2017 CLINICAL DATA:  Intubation and orogastric tube placement. Follow-up pulmonary edema. EXAM: PORTABLE CHEST 1 VIEW 8:24 a.m.: COMPARISON:  Portable chest x-ray earlier today 7:16 a.m., 12/10/2016 and earlier. FINDINGS: Endotracheal tube tip in satisfactory position approximately 5 cm above the carina. OG tube courses below the diaphragm into the stomach, faintly visible. External pacing pads are present. Cardiac silhouette mildly to moderately enlarged. Interval further worsening of interstitial and airspace pulmonary edema throughout both lungs, asymmetric increased on the right, since earlier this morning. Small bilateral pleural effusions. IMPRESSION: 1. Endotracheal tube tip in satisfactory position approximately 5 cm above carina. 2. OG tube courses below the diaphragm into the stomach. 3. Further worsening of interstitial and airspace pulmonary edema, asymmetric and increased on the right. 4. Small bilateral pleural effusions. Electronically Signed   By: Evangeline Dakin M.D.    On: 12/27/2016 09:02   Dg Chest Portable 1 View  Result Date: 12/31/2016 CLINICAL DATA:  Extra distress. EXAM: PORTABLE CHEST 1 VIEW COMPARISON:  12/10/2016 FINDINGS: Stable enlarged cardiac silhouette. There is diffuse airspace pattern suggesting combination of interstitial and pulmonary edema. No pneumothorax. No focal consolidation. IMPRESSION: Interstitial and mild pulmonary edema. Electronically Signed   By: Suzy Bouchard M.D.   On: 01/01/2017 08:04   Dg Abd Portable 1v  Result Date: 12/17/2016 CLINICAL DATA:  Repositioning of previously placed nasogastric tube at the patient bedside. EXAM: PORTABLE ABDOMEN - 1 VIEW 11:13 a.m.: COMPARISON:  Portable abdomen x-ray earlier today 8:24 a.m. FINDINGS: Nasogastric tube tip now projects over the expected location of the gastric antrum. Visualized upper abdominal bowel gas pattern unremarkable. Extensive splenic artery calcification and calcified granulomata in the spleen are noted. IMPRESSION: Nasogastric tube tip now appropriately positioned in the gastric antrum. Electronically Signed   By: Evangeline Dakin M.D.   On: 12/19/2016 13:23   Dg Abd Portable 1 View  Result Date: 12/31/2016 CLINICAL DATA:  OG  tube placement. EXAM: PORTABLE ABDOMEN - 1 VIEW COMPARISON:  Portable chest x-ray obtained concurrently. FINDINGS: Motion blurs the image. The OG tube is difficult to visualize due to the motion and the technique. I believe the to the tip is at the level of the gastric cardia. External pacing pads. Visualized upper abdominal bowel gas pattern unremarkable. IMPRESSION: Motion degraded image shows the OG tube tip at the level of the gastric cardia. This should be advanced. Electronically Signed   By: Evangeline Dakin M.D.   On: 01/07/2017 09:04     Medications:   . fentaNYL infusion INTRAVENOUS 50 mcg/hr (12/17/16 0600)  . piperacillin-tazobactam (ZOSYN)  IV Stopped (12/17/16 1122)  . propofol (DIPRIVAN) infusion 15 mcg/kg/min (12/17/16 1123)  .  valproate sodium Stopped (12/17/16 1026)   . amiodarone  200 mg Per NG tube BID  . chlorhexidine gluconate (MEDLINE KIT)  15 mL Mouth Rinse BID  . clopidogrel  75 mg Per NG tube Daily  . enoxaparin (LOVENOX) injection  1 mg/kg Subcutaneous Q12H  . fentaNYL (SUBLIMAZE) injection  50 mcg Intravenous Once  . furosemide  40 mg Intravenous Q12H  . insulin aspart  0-15 Units Subcutaneous Q4H  . levothyroxine  50 mcg Per NG tube QAC breakfast  . mouth rinse  15 mL Mouth Rinse 10 times per day  . metoprolol tartrate  25 mg Per NG tube BID  . pantoprazole (PROTONIX) IV  40 mg Intravenous Q24H  . rosuvastatin  20 mg Per Tube QHS   acetaminophen, docusate sodium, fentaNYL  Assessment/ Plan:  66 y.o. male with a PMHx of Anxiety, bipolar disorder, BPH, coronary artery disease, depression, diabetes mellitus type 2, erectile dysfunction, GERD, gout, hypertension, hypothyroidism, peripheral neuropathy, obesity, chronic kidney disease stage III who was admitted to [facility] on 12/27/2016 for evaluation of chest pain. Upon evaluation the patient was found to be in atrial fibrillation versus flutter.   1. Acute renal failure/chronic kidney disease stage III Baseline creatinine 1.2 with an EGFR 58. Suspect that the patient's acute renal failure is related to heart failure and concomitant illness. -  Renal function stable at the moment. Good urine output noted. No acute indication for dialysis. Renal ultrasound unremarkable. Continue to monitor renal parameters daily. Okay to continue Lasix.  2. Acute respiratory failure/acute on chronic systolic heart failure. Continue ventilatory support as well as Lasix.  3. Anemia of chronic kidney disease. Hemoglobin currently 9.7. No urgent indication for Epogen. Continue to monitor CBC.   LOS: 1 Arriona Prest 9/2/201811:52 AM

## 2016-12-17 NOTE — Progress Notes (Signed)
Late note 1000 Discussed continued A.Fib with Dr. Lyndel Safe. Orders received. Will notify Dr. Clayborn Bigness.

## 2016-12-17 NOTE — Progress Notes (Signed)
[facility] Progess Note     ASSESSMENT/PLAN  ACUTE HYPOXIC HYPERCAPNIC FAILURE FLUID OVERLOAD IN PT WITH CHF / AFIB / + TROPONIN ACUTE KIDNEY INJURY - improving HYPERGLYCEMIA- improved LACTIC ACIDOSIS- improved HYPONATREMIA - improved   PLAN Weaning from ventilator as tolerated. Diuresis as tolerated. On zosyn/vancomycin.  Cardiology/Nephrology following. Rate control as per cards. Glycemic control. Tylenol prn. DVT/GI prophylaxis. Full Code. No family at bedside.  Cc: 32 minutes  Dimas Chyle MD PCCM    SUBJECTIVE:  Patient seen and examined at bedside. Noted events overnight. Low grade fever. Net negative fluid balance 1L overnight. Nephro input appreciated. Elevated troponin- cards following.   VITAL SIGNS: Temp:  [97.5 F (36.4 C)-100.8 F (38.2 C)] 100.2 F (37.9 C) (09/02 1000) Pulse Rate:  [52-118] 112 (09/02 1100) Resp:  [8-19] 11 (09/02 1100) BP: (72-149)/(53-105) 93/64 (09/02 1100) SpO2:  [93 %-100 %] 93 % (09/02 1100) FiO2 (%):  [40 %-60 %] 40 % (09/02 0900)     PHYSICAL EXAMINATION: Physical Examination:   VS: BP 93/64   Pulse (!) 112   Temp 100.2 F (37.9 C)   Resp 11   Ht 6\' 2"  (1.88 m)   Wt 290 lb (131.5 kg)   SpO2 93%   BMI 37.23 kg/m    General Appearance: intubated Neuro: sedated HEENT: ETT + Pulmonary: decreased air entry b/l, no wheezing heard Cardiovascular: Normal S1,S2.  No m/r/g.    Abdomen: Benign, Soft, non-tender, No masses, hepatosplenomegaly, No lymphadenopathy Skin:   warm, no rashes, no ecchymosis  Extremities: normal, no cyanosis, clubbing    LABORATORY PANEL:   CBC  Recent Labs Lab 12/17/16 0305  WBC 12.2*  HGB 9.7*  HCT 30.1*  PLT 375    Chemistries   Recent Labs Lab 01/02/2017 0723 12/19/2016 1447  12/17/16 0305  NA 131* 135  < > 138  K 4.4 4.6  < > 4.4  CL 96* 100*  < > 101  CO2 23 25  < > 28  GLUCOSE 523* 199*  < > 111*  BUN 47* 51*  < > 43*  CREATININE 2.13*  2.16*  < > 1.92*  CALCIUM 9.2 8.5*  < > 8.6*  MG  --  2.1  --   --   PHOS  --  5.9*  --   --   AST 28  --   --   --   ALT 16*  --   --   --   ALKPHOS 87  --   --   --   BILITOT 0.8  --   --   --   < > = values in this interval not displayed.   Recent Labs Lab 01/12/2017 1610 12/19/2016 2000 12/17/16 0004 12/17/16 0425 12/17/16 0752 12/17/16 1149  GLUCAP 178* 138* 133* 102* 116* 159*    Recent Labs Lab 12/18/2016 0724 12/30/2016 0745 12/17/16 0935  PHART 7.11* 7.27* 7.40  PCO2ART 58* 59* 47  PO2ART 71* 96 67*    Recent Labs Lab 01/07/2017 0723  AST 28  ALT 16*  ALKPHOS 87  BILITOT 0.8  ALBUMIN 3.4*    Cardiac Enzymes  Recent Labs Lab 12/17/16 0305  TROPONINI 1.35*    RADIOLOGY:  [place] Retroperitoneal (renal,aorta,ivc Nodes)  Result Date: 12/25/2016 CLINICAL DATA:  Acute renal failure EXAM: RENAL / URINARY TRACT ULTRASOUND COMPLETE COMPARISON:  09/29/2013 FINDINGS: Right Kidney: Length: 11.1 cm. Echogenicity within normal limits. No mass or hydronephrosis visualized. Left Kidney: Length: 11.1 cm. Echogenicity  within normal limits. No mass or hydronephrosis visualized. Bladder: Collapsed by Foley catheter.  Limited assessment. IMPRESSION: No acute finding by renal ultrasound.  Negative for hydronephrosis. Electronically Signed   By: Jerilynn Mages.  Shick M.D.   On: 12/23/2016 15:30   Dg Chest Port 1 View  Result Date: 12/17/2016 CLINICAL DATA:  Intubation, CHF, renal failure EXAM: PORTABLE CHEST 1 VIEW COMPARISON:  01/13/2017 FINDINGS: Endotracheal tube is 2.6 cm above the trachea. NG tube enters the stomach with the tip not visualized. Stable cardiomegaly with improvement in the pulmonary edema pattern compared to yesterday. Posterior layering pleural effusions noted bilaterally. No pneumothorax. IMPRESSION: Improving CHF pattern. Electronically Signed   By: Jerilynn Mages.  Shick M.D.   On: 12/17/2016 10:13   Dg Chest Portable 1 View  Result Date: 12/24/2016 CLINICAL DATA:  Intubation and  orogastric tube placement. Follow-up pulmonary edema. EXAM: PORTABLE CHEST 1 VIEW 8:24 a.m.: COMPARISON:  Portable chest x-ray earlier today 7:16 a.m., 12/10/2016 and earlier. FINDINGS: Endotracheal tube tip in satisfactory position approximately 5 cm above the carina. OG tube courses below the diaphragm into the stomach, faintly visible. External pacing pads are present. Cardiac silhouette mildly to moderately enlarged. Interval further worsening of interstitial and airspace pulmonary edema throughout both lungs, asymmetric increased on the right, since earlier this morning. Small bilateral pleural effusions. IMPRESSION: 1. Endotracheal tube tip in satisfactory position approximately 5 cm above carina. 2. OG tube courses below the diaphragm into the stomach. 3. Further worsening of interstitial and airspace pulmonary edema, asymmetric and increased on the right. 4. Small bilateral pleural effusions. Electronically Signed   By: Evangeline Dakin M.D.   On: 12/20/2016 09:02   Dg Chest Portable 1 View  Result Date: 01/06/2017 CLINICAL DATA:  Extra distress. EXAM: PORTABLE CHEST 1 VIEW COMPARISON:  12/10/2016 FINDINGS: Stable enlarged cardiac silhouette. There is diffuse airspace pattern suggesting combination of interstitial and pulmonary edema. No pneumothorax. No focal consolidation. IMPRESSION: Interstitial and mild pulmonary edema. Electronically Signed   By: Suzy Bouchard M.D.   On: 01/10/2017 08:04   Dg Abd Portable 1v  Result Date: 01/13/2017 CLINICAL DATA:  Repositioning of previously placed nasogastric tube at the patient bedside. EXAM: PORTABLE ABDOMEN - 1 VIEW 11:13 a.m.: COMPARISON:  Portable abdomen x-ray earlier today 8:24 a.m. FINDINGS: Nasogastric tube tip now projects over the expected location of the gastric antrum. Visualized upper abdominal bowel gas pattern unremarkable. Extensive splenic artery calcification and calcified granulomata in the spleen are noted. IMPRESSION: Nasogastric tube  tip now appropriately positioned in the gastric antrum. Electronically Signed   By: Evangeline Dakin M.D.   On: 01/06/2017 13:23   Dg Abd Portable 1 View  Result Date: 12/27/2016 CLINICAL DATA:  OG tube placement. EXAM: PORTABLE ABDOMEN - 1 VIEW COMPARISON:  Portable chest x-ray obtained concurrently. FINDINGS: Motion blurs the image. The OG tube is difficult to visualize due to the motion and the technique. I believe the to the tip is at the level of the gastric cardia. External pacing pads. Visualized upper abdominal bowel gas pattern unremarkable. IMPRESSION: Motion degraded image shows the OG tube tip at the level of the gastric cardia. This should be advanced. Electronically Signed   By: Evangeline Dakin M.D.   On: 01/10/2017 09:04        Dimas Chyle MD   12/17/2016

## 2016-12-17 NOTE — Progress Notes (Signed)
Initial Nutrition Assessment  DOCUMENTATION CODES:   Obesity unspecified  INTERVENTION:  If patient expected to remain intubated grater than 24-48 hours, recommend initiating Vital High Protein at 40 ml/hr + Pro-Stat 60 ml TID via OGT. Goal regimen provides 1560 kcal, 174 grams of protein, 806 ml H2O daily. With current propofol rate provides 1769 kcal.  If tube feeds initiated, recommend providing liquid multivitamin with minerals daily per tube as goal TF regimen does not meet 100% RDIs.  NUTRITION DIAGNOSIS:   Inadequate oral intake related to inability to eat as evidenced by NPO status.  GOAL:   Provide needs based on ASPEN/SCCM guidelines  MONITOR:   Vent status, Labs, Weight trends, TF tolerance, I & O's  REASON FOR ASSESSMENT:   Ventilator    ASSESSMENT:   66 year old male with PMHx of DM type 2, hypothyroidism, anxiety, bipolar disorder, borderline personality disorder, depression, HTN, gout, PVD, GERD, BPH, CAD, CHF, OSA who presented with increased SOB found to have severe respiratory distress, pulmonary edema, acidotic on ABG so patient was intubated after failing BiPAP. Patient also volume overloaded (BNP 2377 on 9/1).   -Recent admission 8/23-8/29 in setting of CHF, ARF, A-fib. -Per chart OGT was advanced yesterday and placement was confirmed with x-ray. Okay to use OGT after advancement.   Per weight history in chart, patient's weight fluctuates between 275-290 lbs. Will use weight of 276.5 lbs (125.4 kg) from 11/30/2016 to estimate needs as it is the lowest true weight in recent weight hx and closer to dry weight than current body weight. Will continue to monitor weight trend during admission.  Access: OGT placed 9/1; verified to terminate in gastric antrum per abdominal x-ray 9/1  Patient is currently intubated on ventilator support MV: 10.6 L/min Temp (24hrs), Avg:98.7 F (37.1 C), Min:97.5 F (36.4 C), Max:100.8 F (38.2 C)  Propofol: 7.9 ml/hr (209 kcal  daily)  Medications reviewed and include: amiodarone, Lasix 40 mg Q12hrs, Novolog 0-15 units Q4hrs, levothyroxine, pantoprazole, fentanyl gtt, Zosyn, propofol gtt, valproate.  Labs reviewed: CBG 102-298 past 24 hrs, BUN 43, Creatinine 1.92. Elevated Troponin. BNP 2377 on 9/1.  Limited Nutrition-Focused physical exam completed. Findings are no fat depletion, no muscle depletion, and mild edema.   Discussed with RN. Patient may extubate today.   Diet Order:  Diet NPO time specified Except for: Sips with Meds  Skin:  Reviewed, no issues  Last BM:  PTA  Height:   Ht Readings from Last 1 Encounters:  12/18/2016 6\' 2"  (1.88 m)    Weight:   Wt Readings from Last 1 Encounters:  01/04/2017 290 lb (131.5 kg)    Ideal Body Weight:  86.4 kg  BMI:  Body mass index is 37.23 kg/m.  Estimated Nutritional Needs:   Kcal:  1379-1756 (11-14 kcal/kg)  Protein:  >/= 173 grams (>/= 2 grams/kg IBW)  Fluid:  per MD in setting of volume overload  EDUCATION NEEDS:   No education needs identified at this time  Willey Blade, MS, RD, LDN Pager: [phone] After Hours Pager: [phone]

## 2016-12-17 NOTE — Progress Notes (Addendum)
ANTIBIOTIC CONSULT NOTE - INITIAL  Pharmacy Consult for Vancomycin  Indication: pneumonia  No Known Allergies  Patient Measurements: Height: 6' 2"  (188 cm) Weight: 290 lb (131.5 kg) IBW/kg (Calculated) : 82.2 Adjusted Body Weight:   Vital Signs: Temp: 99.1 F (37.3 C) (09/02 1700) BP: 95/70 (09/02 1700) Pulse Rate: 93 (09/02 1700) Intake/Output from previous day: 09/01 0701 - 09/02 0700 In: 1012.3 [I.V.:252.3; IV Piggyback:760] Out: 1910 [Urine:1910] Intake/Output from this shift: Total I/O In: 302.1 [I.V.:152.1; Other:50; IV Piggyback:100] Out: 2775 [Urine:2775]  Labs:  Recent Labs  12/20/2016 0723 01/12/2017 1447 01/08/2017 2116 12/17/16 0305  WBC 19.3*  --   --  12.2*  HGB 10.4*  --   --  9.7*  PLT 560*  --   --  375  CREATININE 2.13* 2.16* 1.94* 1.92*   Estimated Creatinine Clearance: 56 mL/min (A) (by C-G formula based on SCr of 1.92 mg/dL (H)).  Recent Labs  12/17/16 1600  VANCORANDOM 10     Microbiology: Recent Results (from the past 720 hour(s))  MRSA PCR Screening     Status: None   Collection Time: 11/30/16  9:06 AM  Result Value Ref Range Status   MRSA by PCR NEGATIVE NEGATIVE Final    Comment:        The GeneXpert MRSA Assay (FDA approved for NASAL specimens only), is one component of a comprehensive MRSA colonization surveillance program. It is not intended to diagnose MRSA infection nor to guide or monitor treatment for MRSA infections.   MRSA PCR Screening     Status: None   Collection Time: 12/07/16 10:39 PM  Result Value Ref Range Status   MRSA by PCR NEGATIVE NEGATIVE Final    Comment:        The GeneXpert MRSA Assay (FDA approved for NASAL specimens only), is one component of a comprehensive MRSA colonization surveillance program. It is not intended to diagnose MRSA infection nor to guide or monitor treatment for MRSA infections.   Blood culture (routine x 2)     Status: None (Preliminary result)   Collection Time: 01/07/2017   7:24 AM  Result Value Ref Range Status   Specimen Description BLOOD RIGHT ANTECUBITAL  Final   Special Requests   Final    BOTTLES DRAWN AEROBIC AND ANAEROBIC Blood Culture adequate volume   Culture NO GROWTH < 24 HOURS  Final   Report Status PENDING  Incomplete  Blood culture (routine x 2)     Status: None (Preliminary result)   Collection Time: 01/09/2017  7:29 AM  Result Value Ref Range Status   Specimen Description BLOOD LEFT ANTECUBITAL  Final   Special Requests   Final    BOTTLES DRAWN AEROBIC AND ANAEROBIC Blood Culture adequate volume   Culture NO GROWTH < 24 HOURS  Final   Report Status PENDING  Incomplete    Medical History: Past Medical History:  Diagnosis Date  . Abnormal levels of other serum enzymes   . Anxiety   . Bipolar disorder ([place])   . BPH (benign prostatic hyperplasia)   . CAD (coronary artery disease)   . CHF (congestive heart failure) ([place])    ?  Marland Kitchen Depression   . Diabetes mellitus, type II ([place])   . Diabetes mellitus, type II, insulin dependent ([place])   . ED (erectile dysfunction)   . GERD (gastroesophageal reflux disease)   . Gout   . History of borderline personality disorder   . Hypertension   . Hypogonadism in male   .  Hypothyroidism   . Insomnia   . Mood disorder ([place])   . Neuropathy   . Obesity   . OSA (obstructive sleep apnea)   . PVD (peripheral vascular disease) ([place])   . Thyroid disease   . Ventricular tachycardia [facility])    August 2018    Medications:  Prescriptions Prior to Admission  Medication Sig Dispense Refill Last Dose  . allopurinol (ZYLOPRIM) 100 MG tablet Take 100 mg by mouth daily.    12/15/2016 at Unknown time  . amiodarone (PACERONE) 400 MG tablet Take 1 tablet (400 mg total) by mouth daily. 30 tablet 0 Past Week at Unknown time  . apixaban (ELIQUIS) 5 MG TABS tablet Take 1 tablet (5 mg total) by mouth 2 (two) times daily. 60 tablet 0 12/15/2016 at Unknown time  . clopidogrel (PLAVIX) 75 MG tablet Take 75 mg by mouth  daily. Reported on 07/26/2015   12/15/2016 at Unknown time  . colchicine 0.6 MG tablet Take 0.6 mg by mouth 2 (two) times daily as needed (gout flare).    PRN at PRN  . Continuous Blood Gluc Sensor (FREESTYLE LIBRE SENSOR SYSTEM) MISC Use 1 each as directed. Please place prior to office visit as directed   Taking  . diclofenac (VOLTAREN) 75 MG EC tablet Take 75 mg by mouth daily as needed (gout flare).    PRN at PRN  . divalproex (DEPAKOTE ER) 500 MG 24 hr tablet Take 1-2 tablets (500-1,000 mg total) by mouth 2 (two) times daily. Take 515m in morning and 10034min evening 270 tablet 1 12/15/2016 at Unknown time  . DULoxetine (CYMBALTA) 60 MG capsule Take 1 capsule (60 mg total) by mouth daily. 90 capsule 1 12/15/2016 at Unknown time  . esomeprazole (NEXIUM) 40 MG capsule Take 40 mg by mouth daily.    12/15/2016 at Unknown time  . fenofibrate (TRICOR) 145 MG tablet Take 145 mg by mouth daily.    12/15/2016 at Unknown time  . furosemide (LASIX) 40 MG tablet Take 1 tablet (40 mg total) by mouth daily. 30 tablet 0 12/15/2016 at Unknown time  . gemfibrozil (LOPID) 600 MG tablet gemfibrozil 600 mg tablet   12/15/2016 at Unknown time  . glucose 4 GM chewable tablet Chew 4 g by mouth as needed for low blood sugar.    PRN at PRN  . Insulin Disposable Pump (V-GO 30) KIT Inject 1.2 Units/hr into the skin every hour. 66 units/24 hrs (36 available bolus units--12 bolus units available per meals)   CONTINOUS at CONTINOUS  . insulin lispro (HUMALOG KWIKPEN) 100 UNIT/ML KiwkPen Inject into the skin See admin instructions. Used ONLY as needed if additional bolus units are needed (up to 36 units/24 hrs.)   PRN at PRN  . isosorbide mononitrate (IMDUR) 60 MG 24 hr tablet Take 60 mg by mouth daily.    12/15/2016 at Unknown time  . JUBLIA 10 % SOLN Apply 1 application topically daily.   Past Week at Unknown time  . levothyroxine (SYNTHROID, LEVOTHROID) 50 MCG tablet Take 50 mcg by mouth daily before breakfast.   12/15/2016 at  Unknown time  . metFORMIN (GLUCOPHAGE) 1000 MG tablet Take 1,000 mg by mouth 2 (two) times daily with a meal. Morning & supper   12/15/2016 at Unknown time  . metoprolol succinate (TOPROL-XL) 50 MG 24 hr tablet Take 50 mg by mouth daily.    12/15/2016 at Unknown time  . nitroGLYCERIN (NITROSTAT) 0.4 MG SL tablet Place 0.4 mg under the tongue every 5 (  five) minutes x 3 doses as needed for chest pain.    PRN at PRN  . pregabalin (LYRICA) 100 MG capsule Take 100 mg by mouth 3 (three) times daily before meals.    12/15/2016 at Unknown time  . rOPINIRole (REQUIP) 2 MG tablet Take 2 mg by mouth 2 (two) times daily.    12/15/2016 at Unknown time  . rosuvastatin (CRESTOR) 20 MG tablet Take 20 mg by mouth at bedtime.    12/15/2016 at Unknown time  . VICTOZA 18 MG/3ML SOPN Inject 1.8 mg into the skin daily.    Past Week at Unknown time  . vitamin C (ASCORBIC ACID) 500 MG tablet Take 500 mg by mouth daily.   12/15/2016 at Unknown time   Assessment: Pharmacy consulted to dose vancomycin in this 66 year old male with PNA, ARF.   Currently levels to dose.   Vancomycin 1750 mg IV X 1 given on 9/1 @ 1600 which resulted in a level of 10 mcg/mL 24 hrs later.   Goal of Therapy:  Vancomycin trough level 15-20 mcg/ml  Plan:  Expected duration 7 days with resolution of temperature and/or normalization of WBC   Vancomycin 1750 mg IV X 1 ordered for 9/3 @ 1000 (18 hrs after 1st level). Will recheck level on 9/3 @ 0930 and use that value to guide dosing.   Eugean Arnott D 12/17/2016,5:19 PM

## 2016-12-17 NOTE — Consult Note (Signed)
ANTICOAGULATION CONSULT NOTE   Pharmacy Consult for enoxaparin Indication: VTE prophylaxis tx dose per consult  No Known Allergies  Patient Measurements: Height: 6\' 2"  (188 cm) Weight: 290 lb (131.5 kg) IBW/kg (Calculated) : 82.2 Heparin Dosing Weight:   Vital Signs: Temp: 99.5 F (37.5 C) (09/02 1442) Temp Source: Core (Comment) (09/02 0400) BP: 103/60 (09/02 1442) Pulse Rate: 81 (09/02 1442)  Labs:  Recent Labs  12/21/2016 0723  01/06/2017 1447 12/20/2016 1637 01/02/2017 2116 12/17/16 0305  HGB 10.4*  --   --   --   --  9.7*  HCT 33.1*  --   --   --   --  30.1*  PLT 560*  --   --   --   --  375  APTT 38*  --   --   --   --   --   LABPROT 15.5*  --   --   --   --   --   INR 1.24  --   --   --   --   --   CREATININE 2.13*  --  2.16*  --  1.94* 1.92*  TROPONINI 0.41*  < >  --  1.20* 1.25* 1.35*  < > = values in this interval not displayed.  Estimated Creatinine Clearance: 56 mL/min (A) (by C-G formula based on SCr of 1.92 mg/dL (H)).   Medical History: Past Medical History:  Diagnosis Date  . Abnormal levels of other serum enzymes   . Anxiety   . Bipolar disorder ([place])   . BPH (benign prostatic hyperplasia)   . CAD (coronary artery disease)   . CHF (congestive heart failure) ([place])    ?  Marland Kitchen Depression   . Diabetes mellitus, type II ([place])   . Diabetes mellitus, type II, insulin dependent ([place])   . ED (erectile dysfunction)   . GERD (gastroesophageal reflux disease)   . Gout   . History of borderline personality disorder   . Hypertension   . Hypogonadism in male   . Hypothyroidism   . Insomnia   . Mood disorder ([place])   . Neuropathy   . Obesity   . OSA (obstructive sleep apnea)   . PVD (peripheral vascular disease) ([place])   . Thyroid disease   . Ventricular tachycardia [facility])    August 2018    Assessment: [name] with a PMH of afib on apixaban PTA. Pt is now intubated. Pharmacy consulted to dose therapeutic lovenox. Pt last dose of apixaban  was 8/30.  Goal of Therapy:   Monitor platelets by anticoagulation protocol: Yes   Plan:  Lovenox 1mg /kg q 12 hr Will need CBC and SCr q3 days per policy  Rayna Sexton, PharmD, BCPS Clinical Pharmacist 12/17/2016 3:07 PM

## 2016-12-18 ENCOUNTER — Inpatient Hospital Stay: Payer: Medicare Other

## 2016-12-18 LAB — GLUCOSE, CAPILLARY
GLUCOSE-CAPILLARY: 139 mg/dL — AB (ref 65–99)
GLUCOSE-CAPILLARY: 203 mg/dL — AB (ref 65–99)
GLUCOSE-CAPILLARY: 208 mg/dL — AB (ref 65–99)
GLUCOSE-CAPILLARY: 218 mg/dL — AB (ref 65–99)
Glucose-Capillary: 118 mg/dL — ABNORMAL HIGH (ref 65–99)
Glucose-Capillary: 144 mg/dL — ABNORMAL HIGH (ref 65–99)

## 2016-12-18 LAB — BASIC METABOLIC PANEL
ANION GAP: 10 (ref 5–15)
BUN: 38 mg/dL — ABNORMAL HIGH (ref 6–20)
CHLORIDE: 98 mmol/L — AB (ref 101–111)
CO2: 30 mmol/L (ref 22–32)
Calcium: 9 mg/dL (ref 8.9–10.3)
Creatinine, Ser: 2.06 mg/dL — ABNORMAL HIGH (ref 0.61–1.24)
GFR calc Af Amer: 38 mL/min — ABNORMAL LOW (ref >60)
GFR calc non Af Amer: 32 mL/min — ABNORMAL LOW (ref >60)
GLUCOSE: 131 mg/dL — AB (ref 65–99)
POTASSIUM: 3.9 mmol/L (ref 3.5–5.1)
Sodium: 138 mmol/L (ref 135–145)

## 2016-12-18 LAB — BLOOD GAS, ARTERIAL
ACID-BASE EXCESS: 4.6 mmol/L — AB (ref 0.0–2.0)
Bicarbonate: 31.2 mmol/L — ABNORMAL HIGH (ref 20.0–28.0)
FIO2: 0.35
MECHVT: 500 mL
Mechanical Rate: 18
O2 SAT: 93.3 %
PEEP/CPAP: 5 cmH2O
PH ART: 7.37 (ref 7.350–7.450)
PO2 ART: 70 mmHg — AB (ref 83.0–108.0)
Patient temperature: 37
RATE: 18 resp/min
pCO2 arterial: 54 mmHg — ABNORMAL HIGH (ref 32.0–48.0)

## 2016-12-18 LAB — PHOSPHORUS: Phosphorus: 4.5 mg/dL (ref 2.5–4.6)

## 2016-12-18 LAB — PROCALCITONIN: PROCALCITONIN: 0.69 ng/mL

## 2016-12-18 LAB — MAGNESIUM: MAGNESIUM: 2.1 mg/dL (ref 1.7–2.4)

## 2016-12-18 MED ORDER — VITAL HIGH PROTEIN PO LIQD
1000.0000 mL | ORAL | Status: DC
  Administered 2016-12-18 – 2016-12-25 (×8): 1000 mL

## 2016-12-18 MED ORDER — SODIUM CHLORIDE 0.9 % IV SOLN
1750.0000 mg | INTRAVENOUS | Status: DC
  Administered 2016-12-18: 1750 mg via INTRAVENOUS
  Filled 2016-12-18 (×2): qty 1750

## 2016-12-18 MED ORDER — AMIODARONE HCL IN DEXTROSE 360-4.14 MG/200ML-% IV SOLN
30.0000 mg/h | INTRAVENOUS | Status: DC
  Administered 2016-12-18 – 2016-12-21 (×8): 30 mg/h via INTRAVENOUS
  Filled 2016-12-18 (×7): qty 200

## 2016-12-18 MED ORDER — AMIODARONE HCL IN DEXTROSE 360-4.14 MG/200ML-% IV SOLN
60.0000 mg/h | INTRAVENOUS | Status: AC
  Administered 2016-12-18 (×2): 60 mg/h via INTRAVENOUS
  Filled 2016-12-18 (×2): qty 200

## 2016-12-18 MED ORDER — AMIODARONE LOAD VIA INFUSION
150.0000 mg | Freq: Once | INTRAVENOUS | Status: AC
  Administered 2016-12-18: 150 mg via INTRAVENOUS
  Filled 2016-12-18: qty 83.34

## 2016-12-18 MED ORDER — PRO-STAT SUGAR FREE PO LIQD
60.0000 mL | Freq: Three times a day (TID) | ORAL | Status: DC
  Administered 2016-12-18 – 2016-12-25 (×24): 60 mL

## 2016-12-18 NOTE — Progress Notes (Signed)
Spoke with primary RN Myra regarding PICC order for this renal pt.   MD wants an IJ line inserted.   I informed RN that IV Team does not insert IJ lines only PICCs in the arms.  She will let MD know.

## 2016-12-18 NOTE — Progress Notes (Signed)
Patient had episodes of restlessness and agitation throughout the shift and had to have bolus of fentanyl, and then turned up Fentanyl and Propofol after RT did 0400 treatment. Central tel called to report that heart rate back in afib around 0350 and staying in afib. Had converted last shift to SR and was SR all night until this episode. Continue to monitor.

## 2016-12-18 NOTE — Consult Note (Signed)
ANTICOAGULATION CONSULT NOTE   Pharmacy Consult for enoxaparin Indication: VTE prophylaxis tx dose per consult  No Known Allergies  Patient Measurements: Height: 6\' 2"  (188 cm) Weight: 290 lb (131.5 kg) IBW/kg (Calculated) : 82.2 Heparin Dosing Weight:   Vital Signs: Temp: 99.5 F (37.5 C) (09/03 0700) BP: 96/80 (09/03 0934) Pulse Rate: 108 (09/03 0700)  Labs:  Recent Labs  01/10/2017 0723  12/19/2016 1637 01/03/2017 2116 12/17/16 0305 12/18/16 0421  HGB 10.4*  --   --   --  9.7*  --   HCT 33.1*  --   --   --  30.1*  --   PLT 560*  --   --   --  375  --   APTT 38*  --   --   --   --   --   LABPROT 15.5*  --   --   --   --   --   INR 1.24  --   --   --   --   --   CREATININE 2.13*  < >  --  1.94* 1.92* 2.06*  TROPONINI 0.41*  < > 1.20* 1.25* 1.35*  --   < > = values in this interval not displayed.  Estimated Creatinine Clearance: 52.2 mL/min (A) (by C-G formula based on SCr of 2.06 mg/dL (H)).   Medical History: Past Medical History:  Diagnosis Date  . Abnormal levels of other serum enzymes   . Anxiety   . Bipolar disorder ([place])   . BPH (benign prostatic hyperplasia)   . CAD (coronary artery disease)   . CHF (congestive heart failure) ([place])    ?  Marland Kitchen Depression   . Diabetes mellitus, type II ([place])   . Diabetes mellitus, type II, insulin dependent ([place])   . ED (erectile dysfunction)   . GERD (gastroesophageal reflux disease)   . Gout   . History of borderline personality disorder   . Hypertension   . Hypogonadism in male   . Hypothyroidism   . Insomnia   . Mood disorder ([place])   . Neuropathy   . Obesity   . OSA (obstructive sleep apnea)   . PVD (peripheral vascular disease) ([place])   . Thyroid disease   . Ventricular tachycardia [facility])    August 2018    Assessment: [name] with a PMH of afib on apixaban PTA. Pt is now intubated. Pharmacy consulted to dose therapeutic lovenox. Pt last dose of apixaban was 8/30.  Goal of Therapy:   Monitor  platelets by anticoagulation protocol: Yes   Plan:  Lovenox 1mg /kg q 12 hr Will need CBC and SCr q3 days per policy  Rayna Sexton, PharmD, BCPS Clinical Pharmacist 12/18/2016 9:49 AM

## 2016-12-18 NOTE — Progress Notes (Signed)
[facility] NAME: Dennis Mullen    MR#:  [id number]  DATE OF BIRTH:  1951/03/06  SUBJECTIVE:  CHIEF COMPLAINT:   Chief Complaint  Patient presents with  . Respiratory Distress     Came with respi distress and intubated, have CHF, renal failure, acidosis.    Remains on vent. Have good diuresis and tapering sedatin today, renal func slightly worse.  REVIEW OF SYSTEMS:  Intubated, so can not give much details, sedated. ROS  DRUG ALLERGIES:  No Known Allergies  VITALS:  Blood pressure 120/82, pulse (!) 110, temperature 99.7 F (37.6 C), resp. rate 17, height 6\' 2"  (1.88 m), weight 131.5 kg (290 lb), SpO2 94 %.  PHYSICAL EXAMINATION:    GENERAL:  66 y.o.-year-old patient lying in the bed with acute distress, critical appearing. EYES: Pupils equal, round, reactive to light. No scleral icterus. Extraocular muscles did not check, pt is sedated.  HEENT: Head atraumatic, normocephalic. Oropharynx and nasopharynx clear.  NECK:  Supple, no jugular venous distention. No thyroid enlargement, no tenderness.  LUNGS: Normal breath sounds bilaterally, no wheezing, b/l crepitation. No use of accessory muscles of respiration. On vent support via ET tube. CARDIOVASCULAR: S1, S2 normal. No murmurs, rubs, or gallops.  ABDOMEN: Soft, nontender, nondistended. Bowel sounds present. No organomegaly or mass.  EXTREMITIES: No pedal edema, cyanosis, or clubbing.  NEUROLOGIC: pt is on vent support, sedated. PSYCHIATRIC: sedated on vent.  SKIN: No obvious rash, lesion, or ulcer.   Physical Exam LABORATORY PANEL:   CBC  Recent Labs Lab 12/17/16 0305  WBC 12.2*  HGB 9.7*  HCT 30.1*  PLT 375   ------------------------------------------------------------------------------------------------------------------  Chemistries   Recent Labs Lab 01/04/2017 0723  12/18/16 0421 12/18/16 0446  NA 131*  < > 138  --   K 4.4  < > 3.9  --   CL 96*  < >  98*  --   CO2 23  < > 30  --   GLUCOSE 523*  < > 131*  --   BUN 47*  < > 38*  --   CREATININE 2.13*  < > 2.06*  --   CALCIUM 9.2  < > 9.0  --   MG  --   < >  --  2.1  AST 28  --   --   --   ALT 16*  --   --   --   ALKPHOS 87  --   --   --   BILITOT 0.8  --   --   --   < > = values in this interval not displayed. ------------------------------------------------------------------------------------------------------------------  Cardiac Enzymes  Recent Labs Lab 12/18/2016 2116 12/17/16 0305  TROPONINI 1.25* 1.35*   ------------------------------------------------------------------------------------------------------------------  RADIOLOGY:  Dg Abd 1 View  Result Date: 12/18/2016 CLINICAL DATA:  OG tube EXAM: ABDOMEN - 1 VIEW COMPARISON:  12/27/2016 FINDINGS: OG tube within the distal stomach as before. Nonobstructive bowel gas pattern. Abdominal atherosclerosis noted. Dense left lower lobe collapse/ consolidation. Degenerative changes of the spine. IMPRESSION: OG tube within the distal stomach. No significant obstruction pattern or ileus Dense left lower lobe collapse/consolidation Electronically Signed   By: Jerilynn Mages.  Shick M.D.   On: 12/18/2016 09:16   Dg Chest Port 1 View  Result Date: 12/18/2016 CLINICAL DATA:  66 year old male central line placement. Admitted with Respiratory distress, intubated. EXAM: PORTABLE CHEST 1 VIEW COMPARISON:  0859 hours today and earlier. FINDINGS: Portable AP upright view at 1959 hours. Endotracheal  tube tip is stable between the level the clavicles and carina. Right IJ approach small caliber triple lumen catheter has been placed. The catheter tip projects about 1 vertebral body below the carina. Enteric tube in place and courses to the abdomen, tip not included. No pneumothorax. More lordotic positioning. Stable to lower lung volumes. Confluent left lung base opacity obscuring the left hemidiaphragm. Stable cardiac size and mediastinal contours. No pulmonary  edema. No other confluent opacity. IMPRESSION: 1. Right IJ approach central line placed, tip at the lower SVC level. 2.  Otherwise stable lines and tubes. 3. Continued low lung volumes with left lung base collapse or consolidation. Electronically Signed   By: Genevie Ann M.D.   On: 12/18/2016 20:18   Dg Chest Port 1 View  Result Date: 12/18/2016 CLINICAL DATA:  Respiratory failure EXAM: PORTABLE CHEST 1 VIEW COMPARISON:  Nineteen 2018 FINDINGS: Endotracheal tube 3.6 cm above the carina. NG tube within the stomach with the tip not visualized. Persistent dense left lower lobe collapse/ consolidation. Posterior layering effusions as before. No significant interval change. No pneumothorax. Trachea is midline. Aorta is atherosclerotic. No current edema. IMPRESSION: Stable support apparatus. Persistent dense left lower lobe collapse / consolidation and bilateral pleural effusions. No significant interval change. Electronically Signed   By: Jerilynn Mages.  Shick M.D.   On: 12/18/2016 09:15   Dg Chest Port 1 View  Result Date: 12/17/2016 CLINICAL DATA:  Intubation, CHF, renal failure EXAM: PORTABLE CHEST 1 VIEW COMPARISON:  12/17/2016 FINDINGS: Endotracheal tube is 2.6 cm above the trachea. NG tube enters the stomach with the tip not visualized. Stable cardiomegaly with improvement in the pulmonary edema pattern compared to yesterday. Posterior layering pleural effusions noted bilaterally. No pneumothorax. IMPRESSION: Improving CHF pattern. Electronically Signed   By: Jerilynn Mages.  Shick M.D.   On: 12/17/2016 10:13    ASSESSMENT AND PLAN:   Active Problems:   Acute respiratory failure with hypoxia (HCC)   Acute on chronic systolic CHF (congestive heart failure) (HCC)   Acute respiratory failure (HCC)  * Ac respi failure hypoxic   Ac on ch systolic CHF   pneumonia   S/p intubation   Intencivist and cardio consult.    IV lasix, I/o monitor, renal func monitor,.have good diuresis.   Appreciated ICU physician help,    vanc +  zosyn, awaited cx, procalcitonin is high.    * Uncontrolled DM    Removed his Insuline delivery device    on sliding scale coverage now.   No DKA currently.   Hold oral meds    Now more controlled.  * lactic acidosis    Likely due to CHF    Cont to monitor with vent support.   Improved.  * Uncontrolled Htn   BP was 742 systolic, ER started on nitro drip    Had drop in BP, so stopped. Now stable.  * hypothyroidism     Cont TSH.  * Elevated troponin   Likely coming down from NSTEMI last week.   Monitor every 4 hours.    Now rising some, appreciated cardio help, no further intervention.  * Ac renal failure on CKD stage 3   Monitor closely with lasix use.   Appreciated help by nephrology.   Slightly worse, but nephro advise to cont lasix use and monitor.   * CAD   Cont asa, plavix, metoprolol, rosuvastatin  *  A fib   COnt metoprolol and amiodarone.   Eliquis changed to lovenox [place] for now.    On  amio drip now.    All the records are reviewed and case discussed with Care Management/Social Workerr. Management plans discussed with the patient, family and they are in agreement.  CODE STATUS: Full.  TOTAL TIME TAKING CARE OF THIS PATIENT: 35 minutes.     POSSIBLE D/C IN 1-2 DAYS, DEPENDING ON CLINICAL CONDITION.   Vaughan Basta M.D on 12/18/2016   Between 7am to 6pm - Pager - [phone]  After 6pm go to [email or web address] - password [email or web address] [facility]  Office  [phone]  CC: Primary care physician; Lavera Guise, MD  Note: This dictation was prepared with Dragon dictation along with smaller phrase technology. Any transcriptional errors that result from this process are unintentional.

## 2016-12-18 NOTE — Progress Notes (Signed)
[facility]  ROUNDING NOTE   Subjective:  Patient currently off sedation. Primary team considering extubation. Creatinine currently 2.06 with urine output of 4.6 L over the preceding 24 hours. Appears to be in atrial fibrillation.   Objective:  Vital signs in last 24 hours:  Temp:  [99.1 F (37.3 C)-100.6 F (38.1 C)] 99.5 F (37.5 C) (09/03 0700) Pulse Rate:  [60-118] 108 (09/03 0700) Resp:  [11-19] 18 (09/03 0700) BP: (75-149)/(47-105) 115/87 (09/03 0700) SpO2:  [92 %-99 %] 99 % (09/03 0700) FiO2 (%):  [35 %-40 %] 35 % (09/03 0739)  Weight change:  Filed Weights   01/14/2017 0721  Weight: 131.5 kg (290 lb)    Intake/Output: I/O last 3 completed shifts: In: 892.5 [I.V.:537.5; Other:50; IV CHTVGVSYV:486] Out: 2824 [JZBFM:1040; Emesis/NG output:200]   Intake/Output this shift:  No intake/output data recorded.  Physical Exam: General: Critically ill appearing   Head: Endotracheal/OG tube in place   Eyes: closed  Neck: Supple, trachea midline  Lungs:  Scattered rhonchi, vent supported   Heart: S1S2 Tachycardic, irregular   Abdomen:  Soft, nontender, bowel sounds present  Extremities: 2+ peripheral edema.  Neurologic: Intubated, not following commands   Skin: No lesions       Basic Metabolic Panel:  Recent Labs Lab 01/13/2017 0723 01/07/2017 1447 12/21/2016 2116 12/17/16 0305 12/18/16 0421 12/18/16 0446  NA 131* 135 136 138 138  --   K 4.4 4.6 4.4 4.4 3.9  --   CL 96* 100* 100* 101 98*  --   CO2 23 25 26 28 30   --   GLUCOSE 523* 199* 133* 111* 131*  --   BUN 47* 51* 46* 43* 38*  --   CREATININE 2.13* 2.16* 1.94* 1.92* 2.06*  --   CALCIUM 9.2 8.5* 8.8* 8.6* 9.0  --   MG  --  2.1  --   --   --  2.1  PHOS  --  5.9*  --   --   --  4.5    Liver Function Tests:  Recent Labs Lab 12/29/2016 0723  AST 28  ALT 16*  ALKPHOS 87  BILITOT 0.8  PROT 8.2*  ALBUMIN 3.4*   No results for input(s): LIPASE, AMYLASE in the last 168 hours. No results for  input(s): AMMONIA in the last 168 hours.  CBC:  Recent Labs Lab 12/12/16 0428 12/28/2016 0723 12/17/16 0305  WBC 8.2 19.3* 12.2*  HGB 10.4* 10.4* 9.7*  HCT 31.3* 33.1* 30.1*  MCV 85.3 91.5 86.2  PLT 395 560* 375    Cardiac Enzymes:  Recent Labs Lab 01/08/2017 0903 01/12/2017 1256 12/19/2016 1637 01/09/2017 2116 12/17/16 0305  TROPONINI 0.46* 1.01* 1.20* 1.25* 1.35*    BNP: Invalid input(s): POCBNP  CBG:  Recent Labs Lab 12/17/16 1540 12/17/16 1932 12/18/16 0014 12/18/16 0358 12/18/16 0727  GLUCAP 145* 134* 139* 118* 144*    Microbiology: Results for orders placed or performed during the hospital encounter of 12/28/2016  Blood culture (routine x 2)     Status: None (Preliminary result)   Collection Time: 01/05/2017  7:24 AM  Result Value Ref Range Status   Specimen Description BLOOD RIGHT ANTECUBITAL  Final   Special Requests   Final    BOTTLES DRAWN AEROBIC AND ANAEROBIC Blood Culture adequate volume   Culture NO GROWTH 2 DAYS  Final   Report Status PENDING  Incomplete  Blood culture (routine x 2)     Status: None (Preliminary result)   Collection Time: 12/30/2016  7:29 AM  Result Value Ref Range Status   Specimen Description BLOOD LEFT ANTECUBITAL  Final   Special Requests   Final    BOTTLES DRAWN AEROBIC AND ANAEROBIC Blood Culture adequate volume   Culture NO GROWTH 2 DAYS  Final   Report Status PENDING  Incomplete    Coagulation Studies:  Recent Labs  01/05/2017 0723  LABPROT 15.5*  INR 1.24    Urinalysis: No results for input(s): COLORURINE, LABSPEC, PHURINE, GLUCOSEU, HGBUR, BILIRUBINUR, KETONESUR, PROTEINUR, UROBILINOGEN, NITRITE, LEUKOCYTESUR in the last 72 hours.  Invalid input(s): APPERANCEUR    Imaging: [place] Retroperitoneal (renal,aorta,ivc Nodes)  Result Date: 01/05/2017 CLINICAL DATA:  Acute renal failure EXAM: RENAL / URINARY TRACT ULTRASOUND COMPLETE COMPARISON:  09/29/2013 FINDINGS: Right Kidney: Length: 11.1 cm. Echogenicity within normal  limits. No mass or hydronephrosis visualized. Left Kidney: Length: 11.1 cm. Echogenicity within normal limits. No mass or hydronephrosis visualized. Bladder: Collapsed by Foley catheter.  Limited assessment. IMPRESSION: No acute finding by renal ultrasound.  Negative for hydronephrosis. Electronically Signed   By: Jerilynn Mages.  Shick M.D.   On: 01/06/2017 15:30   Dg Chest Port 1 View  Result Date: 12/17/2016 CLINICAL DATA:  Intubation, CHF, renal failure EXAM: PORTABLE CHEST 1 VIEW COMPARISON:  01/07/2017 FINDINGS: Endotracheal tube is 2.6 cm above the trachea. NG tube enters the stomach with the tip not visualized. Stable cardiomegaly with improvement in the pulmonary edema pattern compared to yesterday. Posterior layering pleural effusions noted bilaterally. No pneumothorax. IMPRESSION: Improving CHF pattern. Electronically Signed   By: Jerilynn Mages.  Shick M.D.   On: 12/17/2016 10:13   Dg Abd Portable 1v  Result Date: 12/20/2016 CLINICAL DATA:  Repositioning of previously placed nasogastric tube at the patient bedside. EXAM: PORTABLE ABDOMEN - 1 VIEW 11:13 a.m.: COMPARISON:  Portable abdomen x-ray earlier today 8:24 a.m. FINDINGS: Nasogastric tube tip now projects over the expected location of the gastric antrum. Visualized upper abdominal bowel gas pattern unremarkable. Extensive splenic artery calcification and calcified granulomata in the spleen are noted. IMPRESSION: Nasogastric tube tip now appropriately positioned in the gastric antrum. Electronically Signed   By: Evangeline Dakin M.D.   On: 01/14/2017 13:23     Medications:   . amiodarone 60 mg/hr (12/18/16 0843)  . amiodarone    . feeding supplement (VITAL HIGH PROTEIN) 1,000 mL (12/18/16 0757)  . fentaNYL infusion INTRAVENOUS 100 mcg/hr (12/18/16 0816)  . piperacillin-tazobactam (ZOSYN)  IV 3.375 g (12/18/16 0527)  . propofol (DIPRIVAN) infusion 20 mcg/kg/min (12/18/16 0813)  . valproate sodium Stopped (12/17/16 2237)  . vancomycin     . chlorhexidine  gluconate (MEDLINE KIT)  15 mL Mouth Rinse BID  . clopidogrel  75 mg Per NG tube Daily  . enoxaparin (LOVENOX) injection  1 mg/kg Subcutaneous Q12H  . feeding supplement (PRO-STAT SUGAR FREE 64)  60 mL Per Tube TID  . fentaNYL (SUBLIMAZE) injection  50 mcg Intravenous Once  . insulin aspart  0-15 Units Subcutaneous Q4H  . levothyroxine  50 mcg Per NG tube QAC breakfast  . mouth rinse  15 mL Mouth Rinse 10 times per day  . metoprolol tartrate  25 mg Per NG tube BID  . pantoprazole (PROTONIX) IV  40 mg Intravenous Q24H  . rosuvastatin  20 mg Per Tube QHS   acetaminophen, docusate sodium, fentaNYL  Assessment/ Plan:  66 y.o. male with a PMHx of Anxiety, bipolar disorder, BPH, coronary artery disease, depression, diabetes mellitus type 2, erectile dysfunction, GERD, gout, hypertension, hypothyroidism, peripheral neuropathy, obesity, chronic kidney disease  stage III who was admitted to [facility] on 12/31/2016 for evaluation of chest pain. Upon evaluation the patient was found to be in atrial fibrillation versus flutter.   1. Acute renal failure/chronic kidney disease stage III Baseline creatinine 1.2 with an EGFR 58. Suspect that the patient's acute renal failure is related to heart failure and concomitant illness. -  Creatinine slightly up to 2.06 however BUN down to 38. The okay to continue Lasix at this point in time. Monitor renal parameters daily.  2. Acute respiratory failure/acute on chronic systolic heart failure. Patient responding well to furosemide. Continue ventilatory support.  3. Anemia of chronic kidney disease. Hemoglobin was 9.7 at last check. No indication for Epogen at the moment however we may need to consider this if hemoglobin continues to drop.   LOS: 2 Brekyn Huntoon 9/3/20188:53 AM

## 2016-12-18 NOTE — Consult Note (Signed)
[facility] NOTE  Pharmacy Consult for Vanc/zosyn Indication:    No Known Allergies  Patient Measurements: Height: 6\' 2"  (188 cm) Weight: 290 lb (131.5 kg) IBW/kg (Calculated) : 82.2 Adjusted Body Weight:   Vital Signs: Temp: 99.5 F (37.5 C) (09/03 0700) BP: 96/80 (09/03 0934) Pulse Rate: 108 (09/03 0700) Intake/Output from previous day: 09/02 0701 - 09/03 0700 In: 641.6 [I.V.:441.6; IV Piggyback:150] Out: 0017 [Urine:4625; Emesis/NG output:200] Intake/Output from this shift: No intake/output data recorded. Vent settings for last 24 hours: Vent Mode: PRVC FiO2 (%):  [35 %-36 %] 35 % Set Rate:  [18 bmp] 18 bmp Vt Set:  [500 mL] 500 mL PEEP:  [5 cmH20] 5 cmH20 Plateau Pressure:  [16 cmH20] 16 cmH20  Labs:  Recent Labs  12/27/2016 0723 01/13/2017 1447 12/30/2016 2116 12/17/16 0305 12/18/16 0421 12/18/16 0446  WBC 19.3*  --   --  12.2*  --   --   HGB 10.4*  --   --  9.7*  --   --   HCT 33.1*  --   --  30.1*  --   --   PLT 560*  --   --  375  --   --   APTT 38*  --   --   --   --   --   INR 1.24  --   --   --   --   --   CREATININE 2.13* 2.16* 1.94* 1.92* 2.06*  --   MG  --  2.1  --   --   --  2.1  PHOS  --  5.9*  --   --   --  4.5  ALBUMIN 3.4*  --   --   --   --   --   PROT 8.2*  --   --   --   --   --   AST 28  --   --   --   --   --   ALT 16*  --   --   --   --   --   ALKPHOS 87  --   --   --   --   --   BILITOT 0.8  --   --   --   --   --    Estimated Creatinine Clearance: 52.2 mL/min (A) (by C-G formula based on SCr of 2.06 mg/dL (H)).   Recent Labs  12/18/16 0014 12/18/16 0358 12/18/16 0727  GLUCAP 139* 118* 144*    Microbiology: Recent Results (from the past 720 hour(s))  MRSA PCR Screening     Status: None   Collection Time: 11/30/16  9:06 AM  Result Value Ref Range Status   MRSA by PCR NEGATIVE NEGATIVE Final    Comment:        The GeneXpert MRSA Assay (FDA approved for NASAL specimens only), is one component of  a comprehensive MRSA colonization surveillance program. It is not intended to diagnose MRSA infection nor to guide or monitor treatment for MRSA infections.   MRSA PCR Screening     Status: None   Collection Time: 12/07/16 10:39 PM  Result Value Ref Range Status   MRSA by PCR NEGATIVE NEGATIVE Final    Comment:        The GeneXpert MRSA Assay (FDA approved for NASAL specimens only), is one component of a comprehensive MRSA colonization surveillance program. It is not intended to diagnose MRSA infection nor to guide or monitor treatment  for MRSA infections.   Blood culture (routine x 2)     Status: None (Preliminary result)   Collection Time: 01/13/2017  7:24 AM  Result Value Ref Range Status   Specimen Description BLOOD RIGHT ANTECUBITAL  Final   Special Requests   Final    BOTTLES DRAWN AEROBIC AND ANAEROBIC Blood Culture adequate volume   Culture NO GROWTH 2 DAYS  Final   Report Status PENDING  Incomplete  Blood culture (routine x 2)     Status: None (Preliminary result)   Collection Time: 01/12/2017  7:29 AM  Result Value Ref Range Status   Specimen Description BLOOD LEFT ANTECUBITAL  Final   Special Requests   Final    BOTTLES DRAWN AEROBIC AND ANAEROBIC Blood Culture adequate volume   Culture NO GROWTH 2 DAYS  Final   Report Status PENDING  Incomplete    Assessment: [name] admitted for respiratory distress. Currently intubated and found to have pulmonary edema, possible PNA. Pharmacy consulted for vanc and zosyn dosing.   Goal of Therapy:  vanc trough 15-20   Plan:  Continue Zosyn 3.375g q 8hr EI dosing  9/1: Vancomycin 1750mg  once given 9/1. Pt is currently in ARF, will therefore dose off of levels. Will check a level in 24 hours to evaluate clearance.  9/3: Vancomycin random level of 10 mcg/mL 24 hrs later 9/2 at 1600.  No dose appears to have been given on 9/2. Will d/c trough ordered for today. SCr about stable between 1.92 and 2.16 last  three days. Will order vancomycin 1750 mg IV q24h to start now for expected trough of ~16. Will need to closely monitor renal function. Will need to assess renal function daily. Will order trough for 9/5 at 0900 for now - please adjust as needed.   Rayna Sexton, PharmD, BCPS Clinical Pharmacist 12/18/2016 9:59 AM

## 2016-12-18 NOTE — Consult Note (Signed)
[facility] NOTE  Pharmacy Consult for electrolytes/ vanc/zosyn Indication:    No Known Allergies  Patient Measurements: Height: 6' 2"  (188 cm) Weight: 290 lb (131.5 kg) IBW/kg (Calculated) : 82.2 Adjusted Body Weight:   Vital Signs: Temp: 99.1 F (37.3 C) (09/03 0400) BP: 125/48 (09/03 0400) Pulse Rate: 94 (09/03 0400) Intake/Output from previous day: 09/02 0701 - 09/03 0700 In: 554.9 [I.V.:354.9; IV Piggyback:150] Out: 7322 [Urine:4375] Intake/Output from this shift: Total I/O In: 219.2 [I.V.:169.2; IV Piggyback:50] Out: 1600 [Urine:1600] Vent settings for last 24 hours: Vent Mode: PRVC FiO2 (%):  [35 %-40 %] 35 % Set Rate:  [18 bmp] 18 bmp Vt Set:  [500 mL] 500 mL PEEP:  [5 cmH20] 5 cmH20 Plateau Pressure:  [16 cmH20] 16 cmH20  Labs:  Recent Labs  12/23/2016 0723 01/01/2017 1447 01/06/2017 2116 12/17/16 0305 12/18/16 0421  WBC 19.3*  --   --  12.2*  --   HGB 10.4*  --   --  9.7*  --   HCT 33.1*  --   --  30.1*  --   PLT 560*  --   --  375  --   APTT 38*  --   --   --   --   INR 1.24  --   --   --   --   CREATININE 2.13* 2.16* 1.94* 1.92* 2.06*  MG  --  2.1  --   --   --   PHOS  --  5.9*  --   --   --   ALBUMIN 3.4*  --   --   --   --   PROT 8.2*  --   --   --   --   AST 28  --   --   --   --   ALT 16*  --   --   --   --   ALKPHOS 87  --   --   --   --   BILITOT 0.8  --   --   --   --    Estimated Creatinine Clearance: 52.2 mL/min (A) (by C-G formula based on SCr of 2.06 mg/dL (H)).   Recent Labs  12/17/16 1932 12/18/16 0014 12/18/16 0358  GLUCAP 134* 139* 118*    Microbiology: Recent Results (from the past 720 hour(s))  MRSA PCR Screening     Status: None   Collection Time: 11/30/16  9:06 AM  Result Value Ref Range Status   MRSA by PCR NEGATIVE NEGATIVE Final    Comment:        The GeneXpert MRSA Assay (FDA approved for NASAL specimens only), is one component of a comprehensive MRSA colonization surveillance program.  It is not intended to diagnose MRSA infection nor to guide or monitor treatment for MRSA infections.   MRSA PCR Screening     Status: None   Collection Time: 12/07/16 10:39 PM  Result Value Ref Range Status   MRSA by PCR NEGATIVE NEGATIVE Final    Comment:        The GeneXpert MRSA Assay (FDA approved for NASAL specimens only), is one component of a comprehensive MRSA colonization surveillance program. It is not intended to diagnose MRSA infection nor to guide or monitor treatment for MRSA infections.   Blood culture (routine x 2)     Status: None (Preliminary result)   Collection Time: 01/11/2017  7:24 AM  Result Value Ref Range Status   Specimen Description BLOOD RIGHT  ANTECUBITAL  Final   Special Requests   Final    BOTTLES DRAWN AEROBIC AND ANAEROBIC Blood Culture adequate volume   Culture NO GROWTH < 24 HOURS  Final   Report Status PENDING  Incomplete  Blood culture (routine x 2)     Status: None (Preliminary result)   Collection Time: 12/19/2016  7:29 AM  Result Value Ref Range Status   Specimen Description BLOOD LEFT ANTECUBITAL  Final   Special Requests   Final    BOTTLES DRAWN AEROBIC AND ANAEROBIC Blood Culture adequate volume   Culture NO GROWTH < 24 HOURS  Final   Report Status PENDING  Incomplete    Medications:  Scheduled:  . amiodarone  200 mg Per NG tube BID  . chlorhexidine gluconate (MEDLINE KIT)  15 mL Mouth Rinse BID  . clopidogrel  75 mg Per NG tube Daily  . enoxaparin (LOVENOX) injection  1 mg/kg Subcutaneous Q12H  . fentaNYL (SUBLIMAZE) injection  50 mcg Intravenous Once  . furosemide  40 mg Intravenous Q12H  . insulin aspart  0-15 Units Subcutaneous Q4H  . levothyroxine  50 mcg Per NG tube QAC breakfast  . mouth rinse  15 mL Mouth Rinse 10 times per day  . metoprolol tartrate  25 mg Per NG tube BID  . pantoprazole (PROTONIX) IV  40 mg Intravenous Q24H  . rosuvastatin  20 mg Per Tube QHS    Assessment: [name] admitted for  respiratory distress. Currently intubated and found to have pulmonary edema, possible PNA. Pharmacy consulted for electrolyte managament, vanc and zosyn dosing. Pt currently receiving diuretics  Goal of Therapy:  vanc trough 15-20 normalization of electrolytes  Plan:  Zosyn 3.375g q 8hr EI dosing  Vancomycin 1741m once Pt is currently in ARF, will therefore dose off of levels. Will check a level in 24 hours to evaluate clearance.  Electrolytes: no relacement warranted. Will continue to monitor with provider ordered BMPs. Next check at 2100. N  MLS  12/18/2016,5:23 AM   9/1 2115 electrolytes. No replacement indicated. F/u BMP with AM labs.  9/2 AM electrolytes. No replacement indicated. F/u BMP with tomorrow AM labs.  9/3 AM electrolytes. No replacement indicated. F/u BMP with tomorrow AM labs.  MSim Boast PharmD, BCPS  12/18/16 5:23 AM

## 2016-12-18 NOTE — Progress Notes (Signed)
Inpatient Diabetes Program Recommendations  AACE/ADA: New Consensus Statement on Inpatient Glycemic Control (2015)  Target Ranges:  Prepandial:   less than 140 mg/dL      Peak postprandial:   less than 180 mg/dL (1-2 hours)      Critically ill patients:  140 - 180 mg/dL   Lab Results  Component Value Date   GLUCAP 144 (H) 12/18/2016   HGBA1C 7.3 (H) 11/30/2016    Review of Glycemic Control  Inpatient Diabetes Program Recommendations:    Please consider Adult ICU Glycemic Control order set.  Thank you, Nani Gasser. Latanza Pfefferkorn, RN, MSN, CDE  Diabetes Coordinator Inpatient Glycemic Control Team Team Pager [phone] (8am-5pm) 12/18/2016 9:04 AM

## 2016-12-18 NOTE — Progress Notes (Signed)
MEDICATION RELATED CONSULT NOTE - INITIAL   Pharmacy Consult for amiodarone drug interactions Indication: amiodarone being used for Afib with CHF in the setting of uncontrolled HTN   No Known Allergies  Patient Measurements: Height: 6' 2"  (188 cm) Weight: 290 lb (131.5 kg) IBW/kg (Calculated) : 82.2  Vital Signs: Temp: 99.5 F (37.5 C) (09/03 0700) BP: 115/87 (09/03 0700) Pulse Rate: 108 (09/03 0700) Intake/Output from previous day: 09/02 0701 - 09/03 0700 In: 641.6 [I.V.:441.6; IV Piggyback:150] Out: 4825 [Urine:4625; Emesis/NG output:200] Intake/Output from this shift: No intake/output data recorded.  Labs:  Recent Labs  12/30/2016 0723 12/21/2016 1447 01/04/2017 2116 12/17/16 0305 12/18/16 0421 12/18/16 0446  WBC 19.3*  --   --  12.2*  --   --   HGB 10.4*  --   --  9.7*  --   --   HCT 33.1*  --   --  30.1*  --   --   PLT 560*  --   --  375  --   --   APTT 38*  --   --   --   --   --   CREATININE 2.13* 2.16* 1.94* 1.92* 2.06*  --   MG  --  2.1  --   --   --  2.1  PHOS  --  5.9*  --   --   --  4.5  ALBUMIN 3.4*  --   --   --   --   --   PROT 8.2*  --   --   --   --   --   AST 28  --   --   --   --   --   ALT 16*  --   --   --   --   --   ALKPHOS 87  --   --   --   --   --   BILITOT 0.8  --   --   --   --   --    Estimated Creatinine Clearance: 52.2 mL/min (A) (by C-G formula based on SCr of 2.06 mg/dL (H)).   Microbiology: Recent Results (from the past 720 hour(s))  MRSA PCR Screening     Status: None   Collection Time: 11/30/16  9:06 AM  Result Value Ref Range Status   MRSA by PCR NEGATIVE NEGATIVE Final    Comment:        The GeneXpert MRSA Assay (FDA approved for NASAL specimens only), is one component of a comprehensive MRSA colonization surveillance program. It is not intended to diagnose MRSA infection nor to guide or monitor treatment for MRSA infections.   MRSA PCR Screening     Status: None   Collection Time: 12/07/16 10:39 PM  Result Value  Ref Range Status   MRSA by PCR NEGATIVE NEGATIVE Final    Comment:        The GeneXpert MRSA Assay (FDA approved for NASAL specimens only), is one component of a comprehensive MRSA colonization surveillance program. It is not intended to diagnose MRSA infection nor to guide or monitor treatment for MRSA infections.   Blood culture (routine x 2)     Status: None (Preliminary result)   Collection Time: 12/22/2016  7:24 AM  Result Value Ref Range Status   Specimen Description BLOOD RIGHT ANTECUBITAL  Final   Special Requests   Final    BOTTLES DRAWN AEROBIC AND ANAEROBIC Blood Culture adequate volume   Culture NO GROWTH < 24 HOURS  Final   Report Status PENDING  Incomplete  Blood culture (routine x 2)     Status: None (Preliminary result)   Collection Time: 01/12/2017  7:29 AM  Result Value Ref Range Status   Specimen Description BLOOD LEFT ANTECUBITAL  Final   Special Requests   Final    BOTTLES DRAWN AEROBIC AND ANAEROBIC Blood Culture adequate volume   Culture NO GROWTH < 24 HOURS  Final   Report Status PENDING  Incomplete    Medical History: Past Medical History:  Diagnosis Date  . Abnormal levels of other serum enzymes   . Anxiety   . Bipolar disorder ([place])   . BPH (benign prostatic hyperplasia)   . CAD (coronary artery disease)   . CHF (congestive heart failure) ([place])    ?  Marland Kitchen Depression   . Diabetes mellitus, type II ([place])   . Diabetes mellitus, type II, insulin dependent ([place])   . ED (erectile dysfunction)   . GERD (gastroesophageal reflux disease)   . Gout   . History of borderline personality disorder   . Hypertension   . Hypogonadism in male   . Hypothyroidism   . Insomnia   . Mood disorder ([place])   . Neuropathy   . Obesity   . OSA (obstructive sleep apnea)   . PVD (peripheral vascular disease) ([place])   . Thyroid disease   . Ventricular tachycardia [facility])    August 2018    Medications:  Scheduled:  . amiodarone  200 mg Per NG tube BID  . chlorhexidine  gluconate (MEDLINE KIT)  15 mL Mouth Rinse BID  . clopidogrel  75 mg Per NG tube Daily  . enoxaparin (LOVENOX) injection  1 mg/kg Subcutaneous Q12H  . feeding supplement (PRO-STAT SUGAR FREE 64)  60 mL Per Tube TID  . fentaNYL (SUBLIMAZE) injection  50 mcg Intravenous Once  . furosemide  40 mg Intravenous Q12H  . insulin aspart  0-15 Units Subcutaneous Q4H  . levothyroxine  50 mcg Per NG tube QAC breakfast  . mouth rinse  15 mL Mouth Rinse 10 times per day  . metoprolol tartrate  25 mg Per NG tube BID  . pantoprazole (PROTONIX) IV  40 mg Intravenous Q24H  . rosuvastatin  20 mg Per Tube QHS   Infusions:  . feeding supplement (VITAL HIGH PROTEIN)    . fentaNYL infusion INTRAVENOUS 75 mcg/hr (12/18/16 0646)  . piperacillin-tazobactam (ZOSYN)  IV 3.375 g (12/18/16 0527)  . propofol (DIPRIVAN) infusion 15 mcg/kg/min (12/18/16 0646)  . valproate sodium Stopped (12/17/16 2237)  . vancomycin     PRN: acetaminophen, docusate sodium, fentaNYL Anti-infectives    Start     Dose/Rate Route Frequency Ordered Stop   12/18/16 1000  vancomycin (VANCOCIN) 1,750 mg in sodium chloride 0.9 % 500 mL IVPB     1,750 mg 250 mL/hr over 120 Minutes Intravenous  Once 12/17/16 1724     12/18/16 0800  vancomycin (VANCOCIN) 1,750 mg in sodium chloride 0.9 % 500 mL IVPB  Status:  Discontinued     1,750 mg 250 mL/hr over 120 Minutes Intravenous  Once 12/17/16 1717 12/17/16 1724   01/09/2017 1415  piperacillin-tazobactam (ZOSYN) IVPB 3.375 g     3.375 g 12.5 mL/hr over 240 Minutes Intravenous Every 8 hours 01/13/2017 1408     01/13/2017 1415  vancomycin (VANCOCIN) 1,750 mg in sodium chloride 0.9 % 500 mL IVPB     1,750 mg 250 mL/hr over 120 Minutes Intravenous  Once 01/04/2017 1408 12/27/2016 1752  Assessment: Amiodarone 270m per NG bid, risk class c interaction with plavix. Amiodarone may decrease serum concentrations of the active metabolite(s) of Clopidogrel.   Plan:  Continue amiodarone 2013mbid, monitor  labs and vitals for signs of bleeding from theoretical increases in plasma clopidogrel.   GaThomasenia SalesPharmD, MBA, BCKellogglinical Pharmacist [facility]   12/18/2016,7:26 AM

## 2016-12-18 NOTE — Progress Notes (Signed)
[facility] Progess Note     ASSESSMENT/PLAN  ACUTE HYPOXIC HYPERCAPNIC RESPIRATORY FAILURE FLUID OVERLOAD IN PT WITH CHF / AFIB / + TROPONIN ACUTE KIDNEY INJURY - stable HYPERGLYCEMIA- improved LACTIC ACIDOSIS- improved HYPONATREMIA - improved   PLAN Weaning trial and extubation if tolerated. On diuretics - will hold for now. On zosyn/vancomycin. Sputum culture pending. Cardiology/Nephrology following. Rate control as per cards. Glycemic control. Tylenol prn. DVT/GI prophylaxis. Full Code. No family at bedside.  Cc: 32 minutes  Dennis Chyle MD PCCM    SUBJECTIVE:  Patient seen and examined at bedside. Noted events overnight. Net negative fluid balance 4L overnight. Nephro input appreciated. Elevated troponin- cards following.  CxR today was reviewed independently and shows left lower lobe consolidation with ? Effusion.  VITAL SIGNS: Temp:  [99.1 F (37.3 C)-99.7 F (37.6 C)] 99.7 F (37.6 C) (09/03 1100) Pulse Rate:  [56-108] 56 (09/03 1100) Resp:  [18-19] 18 (09/03 1100) BP: (75-125)/(47-87) 110/55 (09/03 1100) SpO2:  [92 %-100 %] 100 % (09/03 1213) FiO2 (%):  [35 %-36 %] 35 % (09/03 1213)   Intake/Output Summary (Last 24 hours) at 12/18/16 1327 Last data filed at 12/18/16 1200  Gross per 24 hour  Intake          1382.76 ml  Output             4525 ml  Net         -3142.24 ml     PHYSICAL EXAMINATION: Physical Examination:   VS: BP (!) 110/55   Pulse (!) 56   Temp 99.7 F (37.6 C)   Resp 18   Ht 6\' 2"  (1.88 m)   Wt 290 lb (131.5 kg)   SpO2 100%   BMI 37.23 kg/m    General Appearance: intubated Neuro: sedated but opens eyes to verbal stimuli and moves all extremities HEENT: ETT + Pulmonary: decreased air entry b/l, no wheezing heard Cardiovascular: Normal S1,S2.  No m/r/g.    Abdomen: Benign, Soft, non-tender, No masses Skin:   warm, no rashes, no ecchymosis  Extremities: normal, no cyanosis,  clubbing    LABORATORY PANEL:   CBC  Recent Labs Lab 12/17/16 0305  WBC 12.2*  HGB 9.7*  HCT 30.1*  PLT 375    Chemistries   Recent Labs Lab 12/30/2016 0723  12/18/16 0421 12/18/16 0446  NA 131*  < > 138  --   K 4.4  < > 3.9  --   CL 96*  < > 98*  --   CO2 23  < > 30  --   GLUCOSE 523*  < > 131*  --   BUN 47*  < > 38*  --   CREATININE 2.13*  < > 2.06*  --   CALCIUM 9.2  < > 9.0  --   MG  --   < >  --  2.1  PHOS  --   < >  --  4.5  AST 28  --   --   --   ALT 16*  --   --   --   ALKPHOS 87  --   --   --   BILITOT 0.8  --   --   --   < > = values in this interval not displayed.   Recent Labs Lab 12/17/16 1540 12/17/16 1932 12/18/16 0014 12/18/16 0358 12/18/16 0727 12/18/16 1119  GLUCAP 145* 134* 139* 118* 144* 218*    Recent Labs Lab 12/31/2016 0745 12/17/16  0935 12/18/16 0315  PHART 7.27* 7.40 7.37  PCO2ART 59* 47 54*  PO2ART 96 67* 70*    Recent Labs Lab 01/13/2017 0723  AST 28  ALT 16*  ALKPHOS 87  BILITOT 0.8  ALBUMIN 3.4*    Cardiac Enzymes  Recent Labs Lab 12/17/16 0305  TROPONINI 1.35*    RADIOLOGY:  Dg Abd 1 View  Result Date: 12/18/2016 CLINICAL DATA:  OG tube EXAM: ABDOMEN - 1 VIEW COMPARISON:  12/27/2016 FINDINGS: OG tube within the distal stomach as before. Nonobstructive bowel gas pattern. Abdominal atherosclerosis noted. Dense left lower lobe collapse/ consolidation. Degenerative changes of the spine. IMPRESSION: OG tube within the distal stomach. No significant obstruction pattern or ileus Dense left lower lobe collapse/consolidation Electronically Signed   By: Jerilynn Mages.  Shick M.D.   On: 12/18/2016 09:16   [place] Retroperitoneal (renal,aorta,ivc Nodes)  Result Date: 01/01/2017 CLINICAL DATA:  Acute renal failure EXAM: RENAL / URINARY TRACT ULTRASOUND COMPLETE COMPARISON:  09/29/2013 FINDINGS: Right Kidney: Length: 11.1 cm. Echogenicity within normal limits. No mass or hydronephrosis visualized. Left Kidney: Length: 11.1 cm.  Echogenicity within normal limits. No mass or hydronephrosis visualized. Bladder: Collapsed by Foley catheter.  Limited assessment. IMPRESSION: No acute finding by renal ultrasound.  Negative for hydronephrosis. Electronically Signed   By: Jerilynn Mages.  Shick M.D.   On: 01/12/2017 15:30   Dg Chest Port 1 View  Result Date: 12/18/2016 CLINICAL DATA:  Respiratory failure EXAM: PORTABLE CHEST 1 VIEW COMPARISON:  Nineteen 2018 FINDINGS: Endotracheal tube 3.6 cm above the carina. NG tube within the stomach with the tip not visualized. Persistent dense left lower lobe collapse/ consolidation. Posterior layering effusions as before. No significant interval change. No pneumothorax. Trachea is midline. Aorta is atherosclerotic. No current edema. IMPRESSION: Stable support apparatus. Persistent dense left lower lobe collapse / consolidation and bilateral pleural effusions. No significant interval change. Electronically Signed   By: Jerilynn Mages.  Shick M.D.   On: 12/18/2016 09:15   Dg Chest Port 1 View  Result Date: 12/17/2016 CLINICAL DATA:  Intubation, CHF, renal failure EXAM: PORTABLE CHEST 1 VIEW COMPARISON:  12/30/2016 FINDINGS: Endotracheal tube is 2.6 cm above the trachea. NG tube enters the stomach with the tip not visualized. Stable cardiomegaly with improvement in the pulmonary edema pattern compared to yesterday. Posterior layering pleural effusions noted bilaterally. No pneumothorax. IMPRESSION: Improving CHF pattern. Electronically Signed   By: Jerilynn Mages.  Shick M.D.   On: 12/17/2016 10:13        Dennis Chyle MD   12/18/2016

## 2016-12-19 ENCOUNTER — Inpatient Hospital Stay: Payer: Medicare Other

## 2016-12-19 DIAGNOSIS — Z515 Encounter for palliative care: Secondary | ICD-10-CM

## 2016-12-19 DIAGNOSIS — I5023 Acute on chronic systolic (congestive) heart failure: Secondary | ICD-10-CM

## 2016-12-19 DIAGNOSIS — Z7189 Other specified counseling: Secondary | ICD-10-CM

## 2016-12-19 DIAGNOSIS — J9601 Acute respiratory failure with hypoxia: Secondary | ICD-10-CM

## 2016-12-19 DIAGNOSIS — N179 Acute kidney failure, unspecified: Secondary | ICD-10-CM

## 2016-12-19 DIAGNOSIS — N183 Chronic kidney disease, stage 3 (moderate): Secondary | ICD-10-CM

## 2016-12-19 LAB — PROTEIN ELECTROPHORESIS, SERUM
A/G RATIO SPE: 0.8 (ref 0.7–1.7)
ALBUMIN ELP: 2.9 g/dL (ref 2.9–4.4)
Alpha-1-Globulin: 0.3 g/dL (ref 0.0–0.4)
Alpha-2-Globulin: 1 g/dL (ref 0.4–1.0)
Beta Globulin: 1 g/dL (ref 0.7–1.3)
Gamma Globulin: 1.2 g/dL (ref 0.4–1.8)
Globulin, Total: 3.5 g/dL (ref 2.2–3.9)
TOTAL PROTEIN ELP: 6.4 g/dL (ref 6.0–8.5)

## 2016-12-19 LAB — BLOOD GAS, ARTERIAL
Acid-Base Excess: 5.9 mmol/L — ABNORMAL HIGH (ref 0.0–2.0)
Bicarbonate: 33.1 mmol/L — ABNORMAL HIGH (ref 20.0–28.0)
FIO2: 0.35
O2 Saturation: 94.1 %
PATIENT TEMPERATURE: 37
PEEP: 5 cmH2O
Pressure support: 8 cmH2O
pCO2 arterial: 60 mmHg — ABNORMAL HIGH (ref 32.0–48.0)
pH, Arterial: 7.35 (ref 7.350–7.450)
pO2, Arterial: 75 mmHg — ABNORMAL LOW (ref 83.0–108.0)

## 2016-12-19 LAB — GLUCOSE, CAPILLARY
GLUCOSE-CAPILLARY: 191 mg/dL — AB (ref 65–99)
GLUCOSE-CAPILLARY: 200 mg/dL — AB (ref 65–99)
GLUCOSE-CAPILLARY: 253 mg/dL — AB (ref 65–99)
Glucose-Capillary: 204 mg/dL — ABNORMAL HIGH (ref 65–99)
Glucose-Capillary: 218 mg/dL — ABNORMAL HIGH (ref 65–99)
Glucose-Capillary: 232 mg/dL — ABNORMAL HIGH (ref 65–99)
Glucose-Capillary: 234 mg/dL — ABNORMAL HIGH (ref 65–99)

## 2016-12-19 LAB — BASIC METABOLIC PANEL
Anion gap: 7 (ref 5–15)
BUN: 34 mg/dL — AB (ref 6–20)
CO2: 30 mmol/L (ref 22–32)
Calcium: 8.6 mg/dL — ABNORMAL LOW (ref 8.9–10.3)
Chloride: 99 mmol/L — ABNORMAL LOW (ref 101–111)
Creatinine, Ser: 1.54 mg/dL — ABNORMAL HIGH (ref 0.61–1.24)
GFR, EST AFRICAN AMERICAN: 53 mL/min — AB (ref >60)
GFR, EST NON AFRICAN AMERICAN: 46 mL/min — AB (ref >60)
GLUCOSE: 209 mg/dL — AB (ref 65–99)
POTASSIUM: 4.1 mmol/L (ref 3.5–5.1)
SODIUM: 136 mmol/L (ref 135–145)

## 2016-12-19 LAB — TRIGLYCERIDES: TRIGLYCERIDES: 234 mg/dL — AB (ref <150)

## 2016-12-19 LAB — ANA W/REFLEX IF POSITIVE: ANA: NEGATIVE

## 2016-12-19 LAB — PROCALCITONIN: PROCALCITONIN: 0.38 ng/mL

## 2016-12-19 MED ORDER — SENNOSIDES 8.8 MG/5ML PO SYRP
10.0000 mL | ORAL_SOLUTION | Freq: Every day | ORAL | Status: DC
  Administered 2016-12-19: 10 mL
  Filled 2016-12-19: qty 10

## 2016-12-19 MED ORDER — DOCUSATE SODIUM 50 MG/5ML PO LIQD
200.0000 mg | Freq: Two times a day (BID) | ORAL | Status: DC
  Administered 2016-12-20 – 2016-12-25 (×13): 200 mg
  Filled 2016-12-19 (×15): qty 20

## 2016-12-19 MED ORDER — SENNOSIDES 8.8 MG/5ML PO SYRP: 10.0000 mL | ORAL_SOLUTION | Freq: Every day | ORAL | Status: DC

## 2016-12-19 MED ORDER — SENNOSIDES 8.8 MG/5ML PO SYRP
10.0000 mL | ORAL_SOLUTION | Freq: Two times a day (BID) | ORAL | Status: DC
  Administered 2016-12-20 – 2016-12-25 (×11): 10 mL
  Filled 2016-12-19 (×14): qty 10

## 2016-12-19 MED ORDER — BISACODYL 10 MG RE SUPP
10.0000 mg | Freq: Every day | RECTAL | Status: DC | PRN
Start: 2016-12-19 — End: 2016-12-26
  Administered 2016-12-24: 10 mg via RECTAL
  Filled 2016-12-19: qty 1

## 2016-12-19 NOTE — Consult Note (Signed)
[place] NOTE  Pharmacy Consult for electrolytes/constipation prevention    No Known Allergies  Patient Measurements: Height: 6' 2"  (188 cm) Weight: 290 lb (131.5 kg) IBW/kg (Calculated) : 82.2  Vital Signs: Temp: 99.1 F (37.3 C) (09/04 2000) BP: 111/52 (09/04 2212) Pulse Rate: 59 (09/04 2212) Intake/Output from previous day: 09/03 0701 - 09/04 0700 In: 2711.9 [I.V.:1123.9; NG/GT:888; IV Piggyback:700] Out: 1500 [Urine:1500] Intake/Output from this shift: Total I/O In: -  Out: 125 [Urine:125] Vent settings for last 24 hours: Vent Mode: PRVC FiO2 (%):  [35 %-40 %] 40 % Set Rate:  [18 bmp] 18 bmp Vt Set:  [500 mL] 500 mL PEEP:  [5 cmH20] 5 cmH20 Pressure Support:  [8 cmH20] 8 cmH20  Labs:  Recent Labs  12/17/16 0305 12/18/16 0421 12/18/16 0446 12/19/16 0522  WBC 12.2*  --   --   --   HGB 9.7*  --   --   --   HCT 30.1*  --   --   --   PLT 375  --   --   --   CREATININE 1.92* 2.06*  --  1.54*  MG  --   --  2.1  --   PHOS  --   --  4.5  --    Estimated Creatinine Clearance: 69.8 mL/min (A) (by C-G formula based on SCr of 1.54 mg/dL (H)).   Recent Labs  12/19/16 1133 12/19/16 1559 12/19/16 1937  GLUCAP 234* 218* 232*     Medications:  Scheduled:  . chlorhexidine gluconate (MEDLINE KIT)  15 mL Mouth Rinse BID  . clopidogrel  75 mg Per NG tube Daily  . docusate  200 mg Per Tube BID  . enoxaparin (LOVENOX) injection  1 mg/kg Subcutaneous Q12H  . feeding supplement (PRO-STAT SUGAR FREE 64)  60 mL Per Tube TID  . fentaNYL (SUBLIMAZE) injection  50 mcg Intravenous Once  . levothyroxine  50 mcg Per NG tube QAC breakfast  . mouth rinse  15 mL Mouth Rinse 10 times per day  . metoprolol tartrate  25 mg Per NG tube BID  . pantoprazole (PROTONIX) IV  40 mg Intravenous Q24H  . rosuvastatin  20 mg Per Tube QHS  . [START ON 12/20/2016] sennosides  10 mL Per Tube BID    Assessment: Pharmacy consulted for electrolyte and constipation  management for 66 yo male ICU patient admitted with respiratory distress. Patient is currently sedated on fentanyl and propofol.   Plan:  1. Electrolytes: no replacement warranted at this time. Will obtain follow up electrolytes with am labs.   2. Constipation: no documented bowel movement. Will advance to docusate 29m VT BID and senna 8.811mVT BID.    Pharmacy will continue to monitor and adjust per consult.   MLS  12/19/16 10:14 PM

## 2016-12-19 NOTE — Consult Note (Signed)
Consultation Note Date: 12/19/2016   Patient Name: Dennis Mullen  DOB: 1950-04-30  MRN: [id number]  Age / Sex: 66 y.o., male  PCP: Lavera Guise, MD Referring Physician: Vaughan Basta, *  Reason for Consultation: Establishing goals of care  HPI/Patient Profile: 66 y.o. male  with past medical history of bipolar disorder, atrial fibrillation, coronary artery disease, congestive heart failure, NSTEMI, HTN, diabetes mellitus type 2, GERD, sleep apnea, peripheral vascular disease, BPH. 4 admission d/t cardiac decline in the past month with VF arrest 12/08/16 with ROSC admitted on 12/25/2016 with respiratory distress s/t CHF exacerbation with OSA complicated by chronic kidney disease requiring intubation on admission 01/02/2017 and continues to fail to wean. Palliative care requested to assist with [place] with poor prognosis and poor vent weaning.   Clinical Assessment and Goals of Care: I met today at Mr. Berka bedside with his son/HCPOA, Larkin Ina. Larkin Ina is able to recount his father's decline and multiple readmissions. He recognizes that his father's health has been declining but does not seem to understand the severity of his current illness. He also says that Mr. Foss was resuscitated 12/08/16 last admission and was able to return home and they felt he was "better." He was only home 3 days prior to readmission with respiratory distress. Larkin Ina was not aware of his father's admission until Monday.   We further discussed the severity of progressing heart failure with renal disease and multiple severe health conditions. Discussed that we are not sure that he will come off ventilator and that we may not be able to find a fluid balance with heart and renal failure. I did discuss the poor outcome expected with resuscitation and that if this is repeated I fear the outcome will not be as good as before and even then he is  still right back in the hospital in critical condition. Encouraged Larkin Ina to speak with his brother about limitations with code status if his father were to worsen instead of improve.   Larkin Ina did tell me that his father has already outlived most of his family with heart disease but struggles with the idea that his father may be approaching EOL. Larkin Ina often jumps to concerns with his father needing more help at home with home health and Life Alert system (tried to redirect him to focusing on trying to extubate first). Larkin Ina does say that his father has made plans for funds to be available to assist with funeral expenses.   Goal is to see if we can maintain fluid balance in order to extubate. Told Larkin Ina that even if this is possible it is only a matter of time before we are back in a critical situation. Family maintains hope for improvement. Will continue to discuss.   Primary Decision Maker HCPOA son Larkin Ina, and then son Thurmond Butts    SUMMARY OF RECOMMENDATIONS   - Encouraged family to consider DNR - Educated on severity of illness and poor prognosis  Code Status/Advance Care Planning:  Full code   Symptom Management:   Per  PCCM - on fentanyl and propofol infusion  LBM 8/28? Adding senokot daily and dulcolax supp daily prn.   Palliative Prophylaxis:   Aspiration, Bowel Regimen, Delirium Protocol, Frequent Pain Assessment, Oral Care and Turn Reposition  Additional Recommendations (Limitations, Scope, Preferences):  Full Scope Treatment  Psycho-social/Spiritual:   Desire for further Chaplaincy support:no  Additional Recommendations: Caregiving  Support/Resources and Grief/Bereavement Support, compassionate wean education  Prognosis:   Unable to determine - but prognosis very poor given recent progression and history  Discharge Planning: To Be Determined      Primary Diagnoses: Present on Admission: . Acute respiratory failure with hypoxia ([place]) . Acute respiratory failure  ([place])   I have reviewed the medical record, interviewed the patient and family, and examined the patient. The following aspects are pertinent.  Past Medical History:  Diagnosis Date  . Abnormal levels of other serum enzymes   . Anxiety   . Bipolar disorder ([place])   . BPH (benign prostatic hyperplasia)   . CAD (coronary artery disease)   . CHF (congestive heart failure) ([place])    ?  Marland Kitchen Depression   . Diabetes mellitus, type II ([place])   . Diabetes mellitus, type II, insulin dependent ([place])   . ED (erectile dysfunction)   . GERD (gastroesophageal reflux disease)   . Gout   . History of borderline personality disorder   . Hypertension   . Hypogonadism in male   . Hypothyroidism   . Insomnia   . Mood disorder ([place])   . Neuropathy   . Obesity   . OSA (obstructive sleep apnea)   . PVD (peripheral vascular disease) ([place])   . Thyroid disease   . Ventricular tachycardia [facility])    August 2018   Social History   Social History  . Marital status: Single    Spouse name: N/A  . Number of children: N/A  . Years of education: N/A   Occupational History  . retired    Social History Main Topics  . Smoking status: Never Smoker  . Smokeless tobacco: Never Used  . Alcohol use No  . Drug use: No  . Sexual activity: Not Currently    Birth control/ protection: None   Other Topics Concern  . None   Social History Narrative   ** Merged History Encounter **       Family History  Problem Relation Age of Onset  . Lung cancer Mother   . Heart attack Father   . Post-traumatic stress disorder Father   . Anxiety disorder Father   . Depression Father   . Heart attack Sister   . Heart attack Brother   . Prostate cancer Neg Hx   . Kidney cancer Neg Hx    Scheduled Meds: . chlorhexidine gluconate (MEDLINE KIT)  15 mL Mouth Rinse BID  . clopidogrel  75 mg Per NG tube Daily  . enoxaparin (LOVENOX) injection  1 mg/kg Subcutaneous Q12H  . feeding supplement (PRO-STAT SUGAR FREE 64)  60  mL Per Tube TID  . fentaNYL (SUBLIMAZE) injection  50 mcg Intravenous Once  . insulin aspart  0-15 Units Subcutaneous Q4H  . levothyroxine  50 mcg Per NG tube QAC breakfast  . mouth rinse  15 mL Mouth Rinse 10 times per day  . metoprolol tartrate  25 mg Per NG tube BID  . pantoprazole (PROTONIX) IV  40 mg Intravenous Q24H  . rosuvastatin  20 mg Per Tube QHS   Continuous Infusions: . amiodarone 30 mg/hr (12/19/16 9326)  .  feeding supplement (VITAL HIGH PROTEIN) 1,000 mL (12/19/16 0552)  . fentaNYL infusion INTRAVENOUS 80 mcg/hr (12/19/16 0552)  . propofol (DIPRIVAN) infusion 20.025 mcg/kg/min (12/19/16 0552)  . valproate sodium Stopped (12/19/16 1157)   PRN Meds:.acetaminophen, docusate sodium, fentaNYL No Known Allergies Review of Systems  Unable to perform ROS: Intubated    Physical Exam  Constitutional: He appears well-developed. He has a sickly appearance. He is intubated.  HENT:  Head: Normocephalic and atraumatic.  Cardiovascular: Normal rate and regular rhythm.   Pulmonary/Chest: No tachypnea. He is intubated. No respiratory distress. He has decreased breath sounds in the right lower field and the left lower field.  Abdominal: Soft. Normal appearance.  Neurological:  Sedated on vent  Nursing note and vitals reviewed.   Vital Signs: BP (!) 119/56   Pulse 65   Temp (!) 100.9 F (38.3 C)   Resp 18   Ht 6' 2" (1.88 m)   Wt 131.5 kg (290 lb)   SpO2 96%   BMI 37.23 kg/m  Pain Assessment: CPOT   Pain Score: 0-No pain   SpO2: SpO2: 96 % O2 Device:SpO2: 96 % O2 Flow Rate: .   IO: Intake/output summary:  Intake/Output Summary (Last 24 hours) at 12/19/16 1315 Last data filed at 12/19/16 1049  Gross per 24 hour  Intake          2021.22 ml  Output             1500 ml  Net           521.22 ml    LBM: Last BM Date:  (unknown, none since admission) Baseline Weight: Weight: 131.5 kg (290 lb) Most recent weight: Weight: 131.5 kg (290 lb)     Palliative  Assessment/Data: Sedated      Time Total: 70 min  Greater than 50%  of this time was spent counseling and coordinating care related to the above assessment and plan.  Signed by: Vinie Sill, NP Palliative Medicine Team Pager # [phone] (M-F 8a-5p) Team Phone # [phone] (Nights/Weekends)

## 2016-12-19 NOTE — Progress Notes (Signed)
Nutrition Follow-up  DOCUMENTATION CODES:   Obesity unspecified  INTERVENTION:  Continue Vital High Protein at 47mL/hr, Pro-stat 57mL TID via OGT Provides 1560 calories, 174 grams of protein, 880mL free water daily. With current propofol rate, provides 1871 calories (106% estimated needs)  Monitor propofol rate and adjust regimen as needed. Patient failed SBTs this morning.  NUTRITION DIAGNOSIS:   Inadequate oral intake related to inability to eat as evidenced by NPO status. -ongoing  GOAL:   Provide needs based on ASPEN/SCCM guidelines -meeting  MONITOR:   Vent status, Labs, Weight trends, TF tolerance, I & O's  REASON FOR ASSESSMENT:   Ventilator    ASSESSMENT:   66 year old male with PMHx of DM type 2, hypothyroidism, anxiety, bipolar disorder, borderline personality disorder, depression, HTN, gout, PVD, GERD, BPH, CAD, CHF, OSA who presented with increased SOB found to have severe respiratory distress, pulmonary edema, acidotic on ABG so patient was intubated after failing BiPAP. Patient also volume overloaded (BNP 2377 on 9/1).  Patient is currently intubated on ventilator support MV: 9.8 L/min Temp (24hrs), Avg:99.8 F (37.7 C), Min:99.3 F (37.4 C), Max:100.4 F (38 C) Propofol: 11.8 ml/hr --> 311.52 calories  Spoke with Son at bedside. He is concerned with patient's recent decline in status.  Possible extubation tomorrow based on weaning trials, failed today. Nephrology following due to Acute on Chronic Kidney Disease III Discussed with pharmacist to start bowel reigmen. Patient has not had a BM since 9/1 per RN  Intake/Output Summary (Last 24 hours) at 12/19/16 1132 Last data filed at 12/19/16 1049  Gross per 24 hour  Intake          2081.22 ml  Output             1600 ml  Net           481.22 ml  4L Fluid Negative  OGT tip to stomach  Labs reviewed:  CBGs 191, 200, 204 BUN/Creatinine 34/1.54 BNP 2,377  Medications reviewed and include:   Novolog 0-15 Units every 4 hours Fentanyl gtt, Amiodarone gtt, Depacon gtt  Diet Order:  Diet NPO time specified Except for: Sips with Meds  Skin:  Reviewed, no issues  Last BM:  PTA  Height:   Ht Readings from Last 1 Encounters:  12/22/2016 6\' 2"  (1.88 m)    Weight:   Wt Readings from Last 1 Encounters:  12/25/2016 290 lb (131.5 kg)    Ideal Body Weight:  86.4 kg  BMI:  Body mass index is 37.23 kg/m.  Estimated Nutritional Needs:   Kcal:  1379-1756 (11-14 kcal/kg)  Protein:  >/= 173 grams (>/= 2 grams/kg IBW)  Fluid:  per MD in setting of volume overload  EDUCATION NEEDS:   No education needs identified at this time  Satira Anis. Nadezhda Pollitt, MS, RD LDN Inpatient Clinical Dietitian Pager [phone]

## 2016-12-19 NOTE — Progress Notes (Signed)
[facility]  ROUNDING NOTE   Subjective:   Intubated, sedated.  Tmax 100.4  UOP 1500  Creatinine 1.54 (2.86)  A-fib   Objective:  Vital signs in last 24 hours:  Temp:  [99.3 F (37.4 C)-100.4 F (38 C)] 99.9 F (37.7 C) (09/04 0600) Pulse Rate:  [56-123] 108 (09/04 0800) Resp:  [8-18] 18 (09/04 0800) BP: (93-144)/(47-86) 140/81 (09/04 0800) SpO2:  [93 %-100 %] 99 % (09/04 0800) FiO2 (%):  [35 %] 35 % (09/04 0720)  Weight change:  Filed Weights   12/23/2016 0721  Weight: 131.5 kg (290 lb)    Intake/Output: I/O last 3 completed shifts: In: 3017.8 [I.V.:1379.8; NG/GT:888; IV Piggyback:750] Out: 8119 [Urine:3350; Emesis/NG output:200]   Intake/Output this shift:  Total I/O In: -  Out: 250 [Urine:250]  Physical Exam: General: Critically ill appearing   Head: +ETT, +OGT  Eyes: PERRL  Neck: RIJ central line, obese  Lungs:  Scattered rhonchi, PRVC FIO2 35%  Heart: Tachycardic, irregular   Abdomen:  Soft, nontender, obese  Extremities: no peripheral or dependent edema.  Neurologic: Intubated, sedated  Skin: No lesions       Basic Metabolic Panel:  Recent Labs Lab 12/20/2016 1447 01/07/2017 2116 12/17/16 0305 12/18/16 0421 12/18/16 0446 12/19/16 0522  NA 135 136 138 138  --  136  K 4.6 4.4 4.4 3.9  --  4.1  CL 100* 100* 101 98*  --  99*  CO2 _0 --  30  GLUCOSE 199* 133* 111* 131*  --  209*  BUN 51* 46* 43* 38*  --  34*  CREATININE 2.16* 1.94* 1.92* 2.06*  --  1.54*  CALCIUM 8.5* 8.8* 8.6* 9.0  --  8.6*  MG 2.1  --   --   --  2.1  --   PHOS 5.9*  --   --   --  4.5  --     Liver Function Tests:  Recent Labs Lab 01/10/2017 0723  AST 28  ALT 16*  ALKPHOS 87  BILITOT 0.8  PROT 8.2*  ALBUMIN 3.4*   No results for input(s): LIPASE, AMYLASE in the last 168 hours. No results for input(s): AMMONIA in the last 168 hours.  CBC:  Recent Labs Lab 12/21/2016 0723 12/17/16 0305  WBC 19.3* 12.2*  HGB 10.4* 9.7*  HCT 33.1* 30.1*   MCV 91.5 86.2  PLT 560* 375    Cardiac Enzymes:  Recent Labs Lab 01/04/2017 0903 12/22/2016 1256 12/25/2016 1637 12/29/2016 2116 12/17/16 0305  TROPONINI 0.46* 1.01* 1.20* 1.25* 1.35*    BNP: Invalid input(s): POCBNP  CBG:  Recent Labs Lab 12/18/16 1557 12/18/16 2003 12/18/16 2357 12/19/16 0406 12/19/16 0728  GLUCAP 208* 203* 204* 200* 191*    Microbiology: Results for orders placed or performed during the hospital encounter of 01/12/2017  Blood culture (routine x 2)     Status: None (Preliminary result)   Collection Time: 01/14/2017  7:24 AM  Result Value Ref Range Status   Specimen Description BLOOD RIGHT ANTECUBITAL  Final   Special Requests   Final    BOTTLES DRAWN AEROBIC AND ANAEROBIC Blood Culture adequate volume   Culture NO GROWTH 3 DAYS  Final   Report Status PENDING  Incomplete  Blood culture (routine x 2)     Status: None (Preliminary result)   Collection Time: 01/10/2017  7:29 AM  Result Value Ref Range Status   Specimen Description BLOOD LEFT ANTECUBITAL  Final   Special Requests  Final    BOTTLES DRAWN AEROBIC AND ANAEROBIC Blood Culture adequate volume   Culture NO GROWTH 3 DAYS  Final   Report Status PENDING  Incomplete  Culture, respiratory (NON-Expectorated)     Status: None (Preliminary result)   Collection Time: 12/18/16 10:00 AM  Result Value Ref Range Status   Specimen Description TRACHEAL ASPIRATE  Final   Special Requests NONE  Final   Gram Stain   Final    ABUNDANT WBC PRESENT,BOTH PMN AND MONONUCLEAR NO ORGANISMS SEEN    Culture   Final    NO GROWTH < 12 HOURS Performed at [facility], [place] [address] [place], [place] [address]    Report Status PENDING  Incomplete    Coagulation Studies: No results for input(s): LABPROT, INR in the last 72 hours.  Urinalysis: No results for input(s): COLORURINE, LABSPEC, PHURINE, GLUCOSEU, HGBUR, BILIRUBINUR, KETONESUR, PROTEINUR, UROBILINOGEN, NITRITE, LEUKOCYTESUR in the last 72  hours.  Invalid input(s): APPERANCEUR    Imaging: Dg Abd 1 View  Result Date: 12/18/2016 CLINICAL DATA:  OG tube EXAM: ABDOMEN - 1 VIEW COMPARISON:  12/24/2016 FINDINGS: OG tube within the distal stomach as before. Nonobstructive bowel gas pattern. Abdominal atherosclerosis noted. Dense left lower lobe collapse/ consolidation. Degenerative changes of the spine. IMPRESSION: OG tube within the distal stomach. No significant obstruction pattern or ileus Dense left lower lobe collapse/consolidation Electronically Signed   By: Jerilynn Mages.  Shick M.D.   On: 12/18/2016 09:16   Dg Chest Port 1 View  Result Date: 12/19/2016 CLINICAL DATA:  Acute respiratory failure EXAM: PORTABLE CHEST 1 VIEW COMPARISON:  12/18/2016 FINDINGS: Support devices are stable. Cardiomegaly with bilateral airspace opacities and layering effusions. Findings likely reflect edema/ CHF. Aeration has worsened since prior study. IMPRESSION: Worsening bilateral airspace disease, likely CHF. Layering bilateral effusions. Electronically Signed   By: Rolm Baptise M.D.   On: 12/19/2016 07:18   Dg Chest Port 1 View  Result Date: 12/18/2016 CLINICAL DATA:  66 year old male central line placement. Admitted with Respiratory distress, intubated. EXAM: PORTABLE CHEST 1 VIEW COMPARISON:  0859 hours today and earlier. FINDINGS: Portable AP upright view at 1959 hours. Endotracheal tube tip is stable between the level the clavicles and carina. Right IJ approach small caliber triple lumen catheter has been placed. The catheter tip projects about 1 vertebral body below the carina. Enteric tube in place and courses to the abdomen, tip not included. No pneumothorax. More lordotic positioning. Stable to lower lung volumes. Confluent left lung base opacity obscuring the left hemidiaphragm. Stable cardiac size and mediastinal contours. No pulmonary edema. No other confluent opacity. IMPRESSION: 1. Right IJ approach central line placed, tip at the lower SVC level. 2.   Otherwise stable lines and tubes. 3. Continued low lung volumes with left lung base collapse or consolidation. Electronically Signed   By: Genevie Ann M.D.   On: 12/18/2016 20:18   Dg Chest Port 1 View  Result Date: 12/18/2016 CLINICAL DATA:  Respiratory failure EXAM: PORTABLE CHEST 1 VIEW COMPARISON:  Nineteen 2018 FINDINGS: Endotracheal tube 3.6 cm above the carina. NG tube within the stomach with the tip not visualized. Persistent dense left lower lobe collapse/ consolidation. Posterior layering effusions as before. No significant interval change. No pneumothorax. Trachea is midline. Aorta is atherosclerotic. No current edema. IMPRESSION: Stable support apparatus. Persistent dense left lower lobe collapse / consolidation and bilateral pleural effusions. No significant interval change. Electronically Signed   By: Jerilynn Mages.  Shick M.D.   On: 12/18/2016 09:15  Medications:   . amiodarone 30 mg/hr (12/19/16 5631)  . feeding supplement (VITAL HIGH PROTEIN) 1,000 mL (12/19/16 0552)  . fentaNYL infusion INTRAVENOUS 80 mcg/hr (12/19/16 0552)  . propofol (DIPRIVAN) infusion 20.025 mcg/kg/min (12/19/16 0552)  . valproate sodium Stopped (12/18/16 2205)   . chlorhexidine gluconate (MEDLINE KIT)  15 mL Mouth Rinse BID  . clopidogrel  75 mg Per NG tube Daily  . enoxaparin (LOVENOX) injection  1 mg/kg Subcutaneous Q12H  . feeding supplement (PRO-STAT SUGAR FREE 64)  60 mL Per Tube TID  . fentaNYL (SUBLIMAZE) injection  50 mcg Intravenous Once  . insulin aspart  0-15 Units Subcutaneous Q4H  . levothyroxine  50 mcg Per NG tube QAC breakfast  . mouth rinse  15 mL Mouth Rinse 10 times per day  . metoprolol tartrate  25 mg Per NG tube BID  . pantoprazole (PROTONIX) IV  40 mg Intravenous Q24H  . rosuvastatin  20 mg Per Tube QHS   acetaminophen, docusate sodium, fentaNYL  Assessment/ Plan:  66 y.o. white male with anxiety, bipolar disorder, BPH, coronary artery disease, depression, diabetes mellitus type 2  insulin dependent, erectile dysfunction, GERD, gout, hypertension, hypothyroidism, peripheral neuropathy, obesity, chronic kidney disease stage III, peripheral vascular disease who was admitted to [facility] on 12/24/2016 for evaluation of chest pain. Upon evaluation the patient was found to be in atrial fibrillation versus flutter.   1. Acute renal failure on chronic kidney disease stage III Baseline creatinine 1-1.3 Acute renal failure secondary to acute respiratory failure, atrial fibrillation with RVR Chronic kidney disease secondary to diabetes, hypertension and vascular disease No recent urinalysis.  Not on ACE-I/ARB as outpatient.  - Holding diuretics.  - Creatinine improving. No indication for dialysis. Continue to renally dose all medications.  - Check urine studies  2. Acute respiratory failure and acute on chronic systolic heart failure. On mechanical ventilation. Echocardiogram reviewed. Moderately reduced systolic function within 2 weeks.  - Now holding furosemide.  - Weaning parameters today.   3. Anemia of chronic kidney disease. Hemoglobin 9.7. No EPO administered.   4. Diabetes mellitus type II with chronic kidney disease: insulin dependent.  - holding metformin   LOS: 3 Pharell Rolfson 9/4/201810:39 AM

## 2016-12-19 NOTE — Progress Notes (Signed)
Subjective:  Intubated sedated, respiratory failure  Objective:  Vital Signs in the last 24 hours: Temp:  [99.3 F (37.4 C)-100.9 F (38.3 C)] 100.9 F (38.3 C) (09/04 1200) Pulse Rate:  [56-123] 65 (09/04 1200) Resp:  [8-18] 18 (09/04 1200) BP: (93-149)/(47-90) 119/56 (09/04 1200) SpO2:  [88 %-99 %] 96 % (09/04 1200) FiO2 (%):  [35 %-40 %] 40 % (09/04 1525)  Intake/Output from previous day: 09/03 0701 - 09/04 0700 In: 2711.9 [I.V.:1123.9; NG/GT:888; IV Piggyback:700] Out: 1500 [Urine:1500] Intake/Output from this shift: Total I/O In: 259.5 [I.V.:259.5] Out: 375 [Urine:375]  Physical Exam: General appearance: appears older than stated age Neck: no adenopathy, no carotid bruit, no JVD, supple, symmetrical, trachea midline and thyroid not enlarged, symmetric, no tenderness/mass/nodules Lungs: clear to auscultation bilaterally Heart: irregularly irregular rhythm Abdomen: soft, non-tender; bowel sounds normal; no masses,  no organomegaly Extremities: extremities normal, atraumatic, no cyanosis or edema Pulses: 2+ and symmetric Skin: Skin color, texture, turgor normal. No rashes or lesions Neurologic: Mental status: Intubated sedated  Lab Results:  Recent Labs  12/17/16 0305  WBC 12.2*  HGB 9.7*  PLT 375    Recent Labs  12/18/16 0421 12/19/16 0522  NA 138 136  K 3.9 4.1  CL 98* 99*  CO2 30 30  GLUCOSE 131* 209*  BUN 38* 34*  CREATININE 2.06* 1.54*    Recent Labs  12/24/2016 2116 12/17/16 0305  TROPONINI 1.25* 1.35*   Hepatic Function Panel No results for input(s): PROT, ALBUMIN, AST, ALT, ALKPHOS, BILITOT, BILIDIR, IBILI in the last 72 hours. No results for input(s): CHOL in the last 72 hours. No results for input(s): PROTIME in the last 72 hours.  Imaging: Imaging results have been reviewed  Cardiac Studies:  Assessment/Plan:  Respiratory failure Chronic renal insufficiency Angina Arrhythmia Atrial  Fibrillation CABG Cardiomyopathy CHF Coronary Artery Disease Edema Ischemic Heart Disease Shortness of Breath  Diabetes Bipolar disorder . Plan Agree with intensive care support Continue ventilatory care Continue diuretic therapy Supplemental oxygen as necessary IV diuretic therapy for heart failure Follow-up with nephrology for renal insufficiency Continue aggressive intensive care therapy Continue diabetes management and control  LOS: 3 days    Dennis Mullen 12/19/2016, 5:02 PM

## 2016-12-19 NOTE — Progress Notes (Addendum)
Inpatient Diabetes Program Recommendations  AACE/ADA: New Consensus Statement on Inpatient Glycemic Control (2015)  Target Ranges:  Prepandial:   less than 140 mg/dL      Peak postprandial:   less than 180 mg/dL (1-2 hours)      Critically ill patients:  140 - 180 mg/dL   Lab Results  Component Value Date   GLUCAP 191 (H) 12/19/2016   HGBA1C 7.3 (H) 11/30/2016    Review of Glycemic Control Results for Dennis Mullen, Dennis Mullen (MRN [id number]) as of 12/19/2016 09:50  Ref. Range 12/18/2016 15:57 12/18/2016 20:03 12/18/2016 23:57 12/19/2016 04:06 12/19/2016 07:28  Glucose-Capillary Latest Ref Range: 65 - 99 mg/dL 208 (H) 203 (H) 204 (H) 200 (H) 191 (H)   Inpatient Diabetes Program Recommendations:    Noted CBGs running >200. -Lantus 10 units qd -Novolog 3 units tubefeed coverage q 4 hrs. (Hold if tube feeding held or stopped)  Thank you, Nani Gasser. Betsabe Iglesia, RN, MSN, CDE  Diabetes Coordinator Inpatient Glycemic Control Team Team Pager [phone] (8am-5pm) 12/19/2016 9:58 AM

## 2016-12-19 NOTE — Progress Notes (Signed)
[facility] NAME: Dennis Mullen    MR#:  [id number]  DATE OF BIRTH:  [birth year]  SUBJECTIVE:  CHIEF COMPLAINT:   Chief Complaint  Patient presents with  . Respiratory Distress     Came with respi distress and intubated, have CHF, renal failure, acidosis.    Remains on vent. Have good diuresis and tapering sedatin today, renal func better.  REVIEW OF SYSTEMS:  Intubated, so can not give much details, sedated. ROS  DRUG ALLERGIES:  No Known Allergies  VITALS:  Blood pressure (!) 119/56, pulse 65, temperature (!) 100.9 F (38.3 C), resp. rate 18, height 6\' 2"  (1.88 m), weight 131.5 kg (290 lb), SpO2 96 %.  PHYSICAL EXAMINATION:    GENERAL:  66 y.o.-year-old patient lying in the bed with acute distress, critical appearing. EYES: Pupils equal, round, reactive to light. No scleral icterus. Extraocular muscles did not check, pt is sedated.  HEENT: Head atraumatic, normocephalic. Oropharynx and nasopharynx clear.  NECK:  Supple, no jugular venous distention. No thyroid enlargement, no tenderness.  LUNGS: Normal breath sounds bilaterally, no wheezing, b/l crepitation. No use of accessory muscles of respiration. On vent support via ET tube. CARDIOVASCULAR: S1, S2 normal. No murmurs, rubs, or gallops.  ABDOMEN: Soft, nontender, nondistended. Bowel sounds present. No organomegaly or mass.  EXTREMITIES: No pedal edema, cyanosis, or clubbing.  NEUROLOGIC: pt is on vent support, sedated. PSYCHIATRIC: sedated on vent.  SKIN: No obvious rash, lesion, or ulcer.   Physical Exam LABORATORY PANEL:   CBC  Recent Labs Lab 12/17/16 0305  WBC 12.2*  HGB 9.7*  HCT 30.1*  PLT 375   ------------------------------------------------------------------------------------------------------------------  Chemistries   Recent Labs Lab 12/25/2016 0723  12/18/16 0446 12/19/16 0522  NA 131*  < >  --  136  K 4.4  < >  --  4.1  CL 96*  < >  --   99*  CO2 23  < >  --  30  GLUCOSE 523*  < >  --  209*  BUN 47*  < >  --  34*  CREATININE 2.13*  < >  --  1.54*  CALCIUM 9.2  < >  --  8.6*  MG  --   < > 2.1  --   AST 28  --   --   --   ALT 16*  --   --   --   ALKPHOS 87  --   --   --   BILITOT 0.8  --   --   --   < > = values in this interval not displayed. ------------------------------------------------------------------------------------------------------------------  Cardiac Enzymes  Recent Labs Lab 12/31/2016 2116 12/17/16 0305  TROPONINI 1.25* 1.35*   ------------------------------------------------------------------------------------------------------------------  RADIOLOGY:  Dg Abd 1 View  Result Date: 12/18/2016 CLINICAL DATA:  OG tube EXAM: ABDOMEN - 1 VIEW COMPARISON:  12/21/2016 FINDINGS: OG tube within the distal stomach as before. Nonobstructive bowel gas pattern. Abdominal atherosclerosis noted. Dense left lower lobe collapse/ consolidation. Degenerative changes of the spine. IMPRESSION: OG tube within the distal stomach. No significant obstruction pattern or ileus Dense left lower lobe collapse/consolidation Electronically Signed   By: Jerilynn Mages.  Shick M.D.   On: 12/18/2016 09:16   Dg Chest Port 1 View  Result Date: 12/19/2016 CLINICAL DATA:  Acute respiratory failure EXAM: PORTABLE CHEST 1 VIEW COMPARISON:  12/18/2016 FINDINGS: Support devices are stable. Cardiomegaly with bilateral airspace opacities and layering effusions. Findings likely reflect edema/ CHF. Aeration has  worsened since prior study. IMPRESSION: Worsening bilateral airspace disease, likely CHF. Layering bilateral effusions. Electronically Signed   By: Rolm Baptise M.D.   On: 12/19/2016 07:18   Dg Chest Port 1 View  Result Date: 12/18/2016 CLINICAL DATA:  66 year old male central line placement. Admitted with Respiratory distress, intubated. EXAM: PORTABLE CHEST 1 VIEW COMPARISON:  0859 hours today and earlier. FINDINGS: Portable AP upright view at 1959 hours.  Endotracheal tube tip is stable between the level the clavicles and carina. Right IJ approach small caliber triple lumen catheter has been placed. The catheter tip projects about 1 vertebral body below the carina. Enteric tube in place and courses to the abdomen, tip not included. No pneumothorax. More lordotic positioning. Stable to lower lung volumes. Confluent left lung base opacity obscuring the left hemidiaphragm. Stable cardiac size and mediastinal contours. No pulmonary edema. No other confluent opacity. IMPRESSION: 1. Right IJ approach central line placed, tip at the lower SVC level. 2.  Otherwise stable lines and tubes. 3. Continued low lung volumes with left lung base collapse or consolidation. Electronically Signed   By: Genevie Ann M.D.   On: 12/18/2016 20:18   Dg Chest Port 1 View  Result Date: 12/18/2016 CLINICAL DATA:  Respiratory failure EXAM: PORTABLE CHEST 1 VIEW COMPARISON:  Nineteen 2018 FINDINGS: Endotracheal tube 3.6 cm above the carina. NG tube within the stomach with the tip not visualized. Persistent dense left lower lobe collapse/ consolidation. Posterior layering effusions as before. No significant interval change. No pneumothorax. Trachea is midline. Aorta is atherosclerotic. No current edema. IMPRESSION: Stable support apparatus. Persistent dense left lower lobe collapse / consolidation and bilateral pleural effusions. No significant interval change. Electronically Signed   By: Jerilynn Mages.  Shick M.D.   On: 12/18/2016 09:15    ASSESSMENT AND PLAN:   Active Problems:   Acute respiratory failure with hypoxia (HCC)   Acute on chronic systolic CHF (congestive heart failure) (HCC)   Acute respiratory failure (HCC)   Acute renal failure superimposed on stage 3 chronic kidney disease (HCC)   Goals of care, counseling/discussion   Palliative care encounter  * Ac respi failure hypoxic   Ac on ch systolic CHF   HCA pneumonia- due to repeated admissions   S/p intubation   Intencivist and  cardio consult.    IV lasix, I/o monitor, renal func monitor,.have good diuresis.   Appreciated ICU physician help,    vanc + zosyn, awaited cx, procalcitonin is high.   Now held lasix.    * Uncontrolled DM    Removed his Insuline delivery device    on sliding scale coverage now.   No DKA currently.   Hold oral meds    Now more controlled.  * lactic acidosis    Likely due to CHF    Cont to monitor with vent support.   Improved.  * Uncontrolled Htn   BP was 595 systolic, ER started on nitro drip    Had drop in BP, so stopped. Now stable.  * hypothyroidism     Cont TSH.  * Elevated troponin   Likely coming down from NSTEMI last week.   Monitor every 4 hours.    Now rising some, appreciated cardio help, no further intervention.  * Ac renal failure on CKD stage 3   Monitor closely with lasix use.   Appreciated help by nephrology.    Slightly improved, hold lasix now.   * CAD   Cont asa, plavix, metoprolol, rosuvastatin  *  A fib  COnt metoprolol and amiodarone.   Eliquis changed to lovenox [place] for now.    On amio drip now.    All the records are reviewed and case discussed with Care Management/Social Workerr. Management plans discussed with the patient, family and they are in agreement.  CODE STATUS: Full.  TOTAL TIME TAKING CARE OF THIS PATIENT: 35 minutes.   Prognosis very poor, palliative care involved.  POSSIBLE D/C IN 1-2 DAYS, DEPENDING ON CLINICAL CONDITION.   Vaughan Basta M.D on 12/19/2016   Between 7am to 6pm - Pager - [phone]  After 6pm go to [email or web address] - password [email or web address] [facility]  Office  [phone]  CC: Primary care physician; Lavera Guise, MD  Note: This dictation was prepared with Dragon dictation along with smaller phrase technology. Any transcriptional errors that result from this process are unintentional.

## 2016-12-19 NOTE — Progress Notes (Signed)
[facility] Progess Note     ASSESSMENT/PLAN   66 year old morbidly obese white male admitted to the ICU for acute respiratory failure secondary to CHF exacerbation in the setting of obstructive sleep apnea was intubated after failing BiPAP now on full vent support   ACUTE HYPOXIC HYPERCAPNIC RESPIRATORY FAILURE FLUID OVERLOAD IN PT WITH CHF / AFIB / + TROPONIN ACUTE KIDNEY INJURY    PLAN Weaning trial and extubation if tolerated.  On diuretics - will hold for now.  On zosyn/vancomycin. Sputum culture pending-will check Pro calcitonin levels. Cardiology/Nephrology following. Rate control as per cards. Glycemic control. Tylenol prn. DVT/GI prophylaxis. Full Code. No family at bedside.         SUBJECTIVE:  Patient seen and examined at bedside. Noted events overnight. Net negative fluid balance 4L overnight. Nephro input appreciated. Elevated troponin- cards following.  CxR today was reviewed independently and shows left lower lobe consolidation with ? Effusion. Patient remains critically ill Patient on full vent support Plan for weaning sedation as tolerated  VITAL SIGNS: Temp:  [99.3 F (37.4 C)-100.4 F (38 C)] 99.9 F (37.7 C) (09/04 0600) Pulse Rate:  [56-123] 108 (09/04 0800) Resp:  [8-18] 18 (09/04 0800) BP: (93-144)/(47-86) 140/81 (09/04 0800) SpO2:  [93 %-100 %] 99 % (09/04 0800) FiO2 (%):  [35 %] 35 % (09/04 0720)   Intake/Output Summary (Last 24 hours) at 12/19/16 0903 Last data filed at 12/19/16 0800  Gross per 24 hour  Intake          2711.87 ml  Output             1625 ml  Net          1086.87 ml     PHYSICAL EXAMINATION: Physical Examination:   VS: BP 140/81   Pulse (!) 108   Temp 99.9 F (37.7 C)   Resp 18   Ht 6\' 2"  (1.88 m)   Wt 290 lb (131.5 kg)   SpO2 99%   BMI 37.23 kg/m    General Appearance: intubated Neuro: sedated but opens eyes to verbal stimuli and moves all extremities HEENT: ETT  + Pulmonary: decreased air entry b/l, no wheezing heard Cardiovascular: Normal S1,S2.  No m/r/g.    Abdomen: Benign, Soft, non-tender, No masses Skin:   warm, no rashes, no ecchymosis  Extremities: normal, no cyanosis, clubbing    LABORATORY PANEL:   CBC  Recent Labs Lab 12/17/16 0305  WBC 12.2*  HGB 9.7*  HCT 30.1*  PLT 375    Chemistries   Recent Labs Lab 12/21/2016 0723  12/18/16 0446 12/19/16 0522  NA 131*  < >  --  136  K 4.4  < >  --  4.1  CL 96*  < >  --  99*  CO2 23  < >  --  30  GLUCOSE 523*  < >  --  209*  BUN 47*  < >  --  34*  CREATININE 2.13*  < >  --  1.54*  CALCIUM 9.2  < >  --  8.6*  MG  --   < > 2.1  --   PHOS  --   < > 4.5  --   AST 28  --   --   --   ALT 16*  --   --   --   ALKPHOS 87  --   --   --   BILITOT 0.8  --   --   --   < > =  values in this interval not displayed.   Recent Labs Lab 12/18/16 1119 12/18/16 1557 12/18/16 2003 12/18/16 2357 12/19/16 0406 12/19/16 0728  GLUCAP 218* 208* 203* 204* 200* 191*    Recent Labs Lab 12/17/16 0935 12/18/16 0315 12/19/16 0332  PHART 7.40 7.37 7.35  PCO2ART 47 54* 60*  PO2ART 67* 70* 75*    Recent Labs Lab 01/09/2017 0723  AST 28  ALT 16*  ALKPHOS 87  BILITOT 0.8  ALBUMIN 3.4*    Cardiac Enzymes  Recent Labs Lab 12/17/16 0305  TROPONINI 1.35*   Critical Care Time devoted to patient care services described in this note is 35 minutes.   Overall, patient is critically ill, prognosis is guarded.  Patient with Multiorgan failure and at high risk for cardiac arrest and death.    Corrin Parker, M.D.  [facility]  Medical Director [place] Director [facility] Cardio-Pulmonary Department

## 2016-12-20 DIAGNOSIS — Z9911 Dependence on respirator [ventilator] status: Secondary | ICD-10-CM

## 2016-12-20 LAB — PROTEIN ELECTRO, RANDOM URINE
ALPHA-1-GLOBULIN, U: 1 %
ALPHA-2-GLOBULIN, U: 6 %
Albumin ELP, Urine: 42.6 %
BETA GLOBULIN, U: 28.4 %
Gamma Globulin, U: 22.1 %
Total Protein, Urine: 15.1 mg/dL

## 2016-12-20 LAB — GLUCOSE, CAPILLARY
GLUCOSE-CAPILLARY: 245 mg/dL — AB (ref 65–99)
GLUCOSE-CAPILLARY: 245 mg/dL — AB (ref 65–99)
GLUCOSE-CAPILLARY: 278 mg/dL — AB (ref 65–99)
GLUCOSE-CAPILLARY: 235 mg/dL — AB (ref 65–99)
GLUCOSE-CAPILLARY: 181 mg/dL — AB (ref 65–99)
GLUCOSE-CAPILLARY: 182 mg/dL — AB (ref 65–99)
GLUCOSE-CAPILLARY: 132 mg/dL — AB (ref 65–99)
GLUCOSE-CAPILLARY: 151 mg/dL — AB (ref 65–99)
GLUCOSE-CAPILLARY: 143 mg/dL — AB (ref 65–99)
GLUCOSE-CAPILLARY: 168 mg/dL — AB (ref 65–99)
Glucose-Capillary: 219 mg/dL — ABNORMAL HIGH (ref 65–99)
Glucose-Capillary: 233 mg/dL — ABNORMAL HIGH (ref 65–99)
Glucose-Capillary: 228 mg/dL — ABNORMAL HIGH (ref 65–99)
Glucose-Capillary: 246 mg/dL — ABNORMAL HIGH (ref 65–99)
Glucose-Capillary: 246 mg/dL — ABNORMAL HIGH (ref 65–99)
Glucose-Capillary: 229 mg/dL — ABNORMAL HIGH (ref 65–99)
Glucose-Capillary: 191 mg/dL — ABNORMAL HIGH (ref 65–99)

## 2016-12-20 LAB — RENAL FUNCTION PANEL
ANION GAP: 9 (ref 5–15)
Albumin: 2.5 g/dL — ABNORMAL LOW (ref 3.5–5.0)
BUN: 41 mg/dL — ABNORMAL HIGH (ref 6–20)
CO2: 31 mmol/L (ref 22–32)
Calcium: 9 mg/dL (ref 8.9–10.3)
Chloride: 100 mmol/L — ABNORMAL LOW (ref 101–111)
Creatinine, Ser: 1.44 mg/dL — ABNORMAL HIGH (ref 0.61–1.24)
GFR calc non Af Amer: 50 mL/min — ABNORMAL LOW (ref >60)
GFR, EST AFRICAN AMERICAN: 58 mL/min — AB (ref >60)
GLUCOSE: 219 mg/dL — AB (ref 65–99)
PHOSPHORUS: 3.8 mg/dL (ref 2.5–4.6)
POTASSIUM: 4.1 mmol/L (ref 3.5–5.1)
Sodium: 140 mmol/L (ref 135–145)

## 2016-12-20 LAB — URINALYSIS, ROUTINE W REFLEX MICROSCOPIC
Bilirubin Urine: NEGATIVE
GLUCOSE, UA: 50 mg/dL — AB
HGB URINE DIPSTICK: NEGATIVE
Ketones, ur: NEGATIVE mg/dL
LEUKOCYTES UA: NEGATIVE
Nitrite: NEGATIVE
PH: 5 (ref 5.0–8.0)
PROTEIN: NEGATIVE mg/dL
SPECIFIC GRAVITY, URINE: 1.023 (ref 1.005–1.030)

## 2016-12-20 LAB — PROTEIN / CREATININE RATIO, URINE
Creatinine, Urine: 136 mg/dL
Protein Creatinine Ratio: 0.15 mg/mg{Cre} (ref 0.00–0.15)
Total Protein, Urine: 21 mg/dL

## 2016-12-20 MED ORDER — SODIUM CHLORIDE 0.9 % IV SOLN
INTRAVENOUS | Status: DC
  Administered 2016-12-20: 6.6 [IU]/h via INTRAVENOUS
  Administered 2016-12-20: 11:00:00 via INTRAVENOUS
  Administered 2016-12-21 (×2): 9.2 [IU]/h via INTRAVENOUS
  Administered 2016-12-22: 22.2 [IU]/h via INTRAVENOUS
  Administered 2016-12-22: 28.2 [IU]/h via INTRAVENOUS
  Administered 2016-12-22: 27.3 [IU]/h via INTRAVENOUS
  Administered 2016-12-23: 21.6 [IU]/h via INTRAVENOUS
  Administered 2016-12-23: 16.5 [IU]/h via INTRAVENOUS
  Administered 2016-12-24: 29.8 [IU]/h via INTRAVENOUS
  Administered 2016-12-24: 18.4 [IU]/h via INTRAVENOUS
  Administered 2016-12-24 – 2016-12-25 (×2): via INTRAVENOUS
  Filled 2016-12-20 (×15): qty 1

## 2016-12-20 MED ORDER — INSULIN ASPART 100 UNIT/ML ~~LOC~~ SOLN
6.0000 [IU] | Freq: Once | SUBCUTANEOUS | Status: AC
  Administered 2016-12-20: 6 [IU] via SUBCUTANEOUS

## 2016-12-20 MED ORDER — INSULIN ASPART 100 UNIT/ML ~~LOC~~ SOLN
2.0000 [IU] | SUBCUTANEOUS | Status: DC
  Administered 2016-12-20 (×2): 6 [IU] via SUBCUTANEOUS
  Filled 2016-12-20 (×2): qty 1

## 2016-12-20 MED ORDER — INSULIN ASPART 100 UNIT/ML ~~LOC~~ SOLN
SUBCUTANEOUS | Status: AC
  Filled 2016-12-20: qty 1

## 2016-12-20 NOTE — Progress Notes (Signed)
Low grade fever unresponsive to APAP, ice packs applied to axillae and groin

## 2016-12-20 NOTE — Progress Notes (Signed)
This note also relates to the following rows which could not be included: SpO2 - Cannot attach notes to unvalidated device data  Pt became more agitated with increased HR, Increased ETCO2, and decreased sats into the mid 80'S. Resume PRVC setting.

## 2016-12-20 NOTE — Progress Notes (Signed)
[facility]  ROUNDING NOTE   Subjective:   Intubated, sedated.   Son meeting with palliative care.   UOP 1730  Creatinine 1.44 (1.54)   Objective:  Vital signs in last 24 hours:  Temp:  [98.8 F (37.1 C)-100.6 F (38.1 C)] 100.6 F (38.1 C) (09/05 1400) Pulse Rate:  [56-115] 88 (09/05 1400) Resp:  [18-20] 18 (09/05 1400) BP: (103-173)/(49-151) 116/64 (09/05 1400) SpO2:  [94 %-100 %] 97 % (09/05 1400) FiO2 (%):  [30 %-40 %] 30 % (09/05 1220)  Weight change:  Filed Weights   01/08/2017 0721  Weight: 131.5 kg (290 lb)    Intake/Output: I/O last 3 completed shifts: In: 3925.1 [I.V.:1881.8; NG/GT:1793.3; IV Piggyback:250] Out: 2855 [Urine:2855]   Intake/Output this shift:  Total I/O In: 707.8 [I.V.:307.8; NG/GT:400] Out: 370 [Urine:370]  Physical Exam: General: Critically ill appearing   Head: +ETT, +OGT  Eyes: Eyes closed  Neck: RIJ central line, obese  Lungs:  Scattered rhonchi, PRVC FIO2 30%  Heart: irregular   Abdomen:  Soft, nontender, obese  Extremities: no peripheral or dependent edema. Left lower extremity cyanosis  Neurologic: Intubated, sedated  Skin: No lesions       Basic Metabolic Panel:  Recent Labs Lab 01/08/2017 1447 12/24/2016 2116 12/17/16 0305 12/18/16 0421 12/18/16 0446 12/19/16 0522 12/20/16 0417  NA 135 136 138 138  --  136 140  K 4.6 4.4 4.4 3.9  --  4.1 4.1  CL 100* 100* 101 98*  --  99* 100*  CO2 25 26 28 30   --  30 31  GLUCOSE 199* 133* 111* 131*  --  209* 219*  BUN 51* 46* 43* 38*  --  34* 41*  CREATININE 2.16* 1.94* 1.92* 2.06*  --  1.54* 1.44*  CALCIUM 8.5* 8.8* 8.6* 9.0  --  8.6* 9.0  MG 2.1  --   --   --  2.1  --   --   PHOS 5.9*  --   --   --  4.5  --  3.8    Liver Function Tests:  Recent Labs Lab 01/03/2017 0723 12/20/16 0417  AST 28  --   ALT 16*  --   ALKPHOS 87  --   BILITOT 0.8  --   PROT 8.2*  --   ALBUMIN 3.4* 2.5*   No results for input(s): LIPASE, AMYLASE in the last 168 hours. No  results for input(s): AMMONIA in the last 168 hours.  CBC:  Recent Labs Lab 12/28/2016 0723 12/17/16 0305  WBC 19.3* 12.2*  HGB 10.4* 9.7*  HCT 33.1* 30.1*  MCV 91.5 86.2  PLT 560* 375    Cardiac Enzymes:  Recent Labs Lab 12/31/2016 0903 01/03/2017 1256 01/08/2017 1637 12/17/2016 2116 12/17/16 0305  TROPONINI 0.46* 1.01* 1.20* 1.25* 1.35*    BNP: Invalid input(s): POCBNP  CBG:  Recent Labs Lab 12/20/16 1028 12/20/16 1115 12/20/16 1136 12/20/16 1240 12/20/16 1344  GLUCAP 245* 245* 278* 246* 235*    Microbiology: Results for orders placed or performed during the hospital encounter of 01/08/2017  Blood culture (routine x 2)     Status: None (Preliminary result)   Collection Time: 01/11/2017  7:24 AM  Result Value Ref Range Status   Specimen Description BLOOD RIGHT ANTECUBITAL  Final   Special Requests   Final    BOTTLES DRAWN AEROBIC AND ANAEROBIC Blood Culture adequate volume   Culture NO GROWTH 4 DAYS  Final   Report Status PENDING  Incomplete  Blood culture (routine  x 2)     Status: None (Preliminary result)   Collection Time: 01/09/2017  7:29 AM  Result Value Ref Range Status   Specimen Description BLOOD LEFT ANTECUBITAL  Final   Special Requests   Final    BOTTLES DRAWN AEROBIC AND ANAEROBIC Blood Culture adequate volume   Culture NO GROWTH 4 DAYS  Final   Report Status PENDING  Incomplete  Culture, respiratory (NON-Expectorated)     Status: None (Preliminary result)   Collection Time: 12/18/16 10:00 AM  Result Value Ref Range Status   Specimen Description TRACHEAL ASPIRATE  Final   Special Requests NONE  Final   Gram Stain   Final    ABUNDANT WBC PRESENT,BOTH PMN AND MONONUCLEAR NO ORGANISMS SEEN    Culture   Final    NO GROWTH 2 DAYS Performed at [facility], [address], [place], [place] [address]    Report Status PENDING  Incomplete    Coagulation Studies: No results for input(s): LABPROT, INR in the last 72 hours.  Urinalysis:  Recent  Labs  12/20/16 0431  COLORURINE YELLOW*  LABSPEC 1.023  PHURINE 5.0  GLUCOSEU 50*  HGBUR NEGATIVE  BILIRUBINUR NEGATIVE  KETONESUR NEGATIVE  PROTEINUR NEGATIVE  NITRITE NEGATIVE  LEUKOCYTESUR NEGATIVE      Imaging: Dg Chest Port 1 View  Result Date: 12/19/2016 CLINICAL DATA:  Acute respiratory failure EXAM: PORTABLE CHEST 1 VIEW COMPARISON:  12/18/2016 FINDINGS: Support devices are stable. Cardiomegaly with bilateral airspace opacities and layering effusions. Findings likely reflect edema/ CHF. Aeration has worsened since prior study. IMPRESSION: Worsening bilateral airspace disease, likely CHF. Layering bilateral effusions. Electronically Signed   By: Rolm Baptise M.D.   On: 12/19/2016 07:18   Dg Chest Port 1 View  Result Date: 12/18/2016 CLINICAL DATA:  66 year old male central line placement. Admitted with Respiratory distress, intubated. EXAM: PORTABLE CHEST 1 VIEW COMPARISON:  0859 hours today and earlier. FINDINGS: Portable AP upright view at 1959 hours. Endotracheal tube tip is stable between the level the clavicles and carina. Right IJ approach small caliber triple lumen catheter has been placed. The catheter tip projects about 1 vertebral body below the carina. Enteric tube in place and courses to the abdomen, tip not included. No pneumothorax. More lordotic positioning. Stable to lower lung volumes. Confluent left lung base opacity obscuring the left hemidiaphragm. Stable cardiac size and mediastinal contours. No pulmonary edema. No other confluent opacity. IMPRESSION: 1. Right IJ approach central line placed, tip at the lower SVC level. 2.  Otherwise stable lines and tubes. 3. Continued low lung volumes with left lung base collapse or consolidation. Electronically Signed   By: Genevie Ann M.D.   On: 12/18/2016 20:18     Medications:   . amiodarone 30 mg/hr (12/20/16 1400)  . feeding supplement (VITAL HIGH PROTEIN) 1,000 mL (12/20/16 1400)  . fentaNYL infusion INTRAVENOUS 75 mcg/hr  (12/20/16 1400)  . insulin (NOVOLIN-R) infusion 9.3 Units/hr (12/20/16 1455)  . propofol (DIPRIVAN) infusion 20 mcg/kg/min (12/20/16 1445)  . valproate sodium Stopped (12/20/16 1141)   . chlorhexidine gluconate (MEDLINE KIT)  15 mL Mouth Rinse BID  . clopidogrel  75 mg Per NG tube Daily  . docusate  200 mg Per Tube BID  . enoxaparin (LOVENOX) injection  1 mg/kg Subcutaneous Q12H  . feeding supplement (PRO-STAT SUGAR FREE 64)  60 mL Per Tube TID  . fentaNYL (SUBLIMAZE) injection  50 mcg Intravenous Once  . levothyroxine  50 mcg Per NG tube QAC breakfast  .  mouth rinse  15 mL Mouth Rinse 10 times per day  . metoprolol tartrate  25 mg Per NG tube BID  . pantoprazole (PROTONIX) IV  40 mg Intravenous Q24H  . rosuvastatin  20 mg Per Tube QHS  . sennosides  10 mL Per Tube BID   acetaminophen, bisacodyl, docusate sodium, fentaNYL  Assessment/ Plan:  66 y.o. white male with anxiety, bipolar disorder, BPH, coronary artery disease, depression, diabetes mellitus type 2 insulin dependent, erectile dysfunction, GERD, gout, hypertension, hypothyroidism, peripheral neuropathy, obesity, chronic kidney disease stage III, peripheral vascular disease who was admitted to [facility] on 01/12/2017 for evaluation of chest pain. Upon evaluation the patient was found to be in atrial fibrillation versus flutter.   1. Acute renal failure on chronic kidney disease stage III Baseline creatinine 1-1.3 Acute renal failure secondary to acute respiratory failure, atrial fibrillation with RVR Chronic kidney disease secondary to diabetes, hypertension and vascular disease No recent urinalysis.  Not on ACE-I/ARB as outpatient.  - Holding diuretics.  - Creatinine improving. No indication for dialysis. Continue to renally dose all medications.   2. Acute respiratory failure and acute on chronic systolic heart failure. On mechanical ventilation. Echocardiogram reviewed. Moderately reduced systolic function within 2 weeks.  - Now  holding furosemide.   3. Anemia of chronic kidney disease. Hemoglobin 9.7. No EPO administered.   4. Diabetes mellitus type II with chronic kidney disease: insulin dependent.  - holding metformin   LOS: [place], Imani Sherrin 9/5/20182:58 PM

## 2016-12-20 NOTE — Progress Notes (Signed)
[facility] Progess Note     ASSESSMENT/PLAN   66 year old morbidly obese white male admitted to the ICU for acute respiratory failure secondary to CHF exacerbation in the setting of obstructive sleep apnea was intubated after failing BiPAP now on full vent support Patient failed spontaneous weaning trial yesterday we'll try again today  ACUTE HYPOXIC HYPERCAPNIC RESPIRATORY FAILURE FLUID OVERLOAD IN PT WITH CHF / AFIB / + TROPONIN ACUTE KIDNEY INJURY    PLAN Weaning trial and extubation if tolerated.  On diuretics - will hold for now.  Antibiotics stopped this process is most likely related to cardiogenic pulmonary edema and CHF with non-STEMI Cardiology/Nephrology following. Rate control as per cards. Glycemic control. Tylenol prn. DVT/GI prophylaxis. Full Code. No family at bedside.    SUBJECTIVE:  Patient seen and examined at bedside.    Elevated troponin- cards following.  CxR today was reviewed independently and shows left lower lobe consolidation with ? Effusion. Patient remains critically ill Patient on full vent support Plan for weaning sedation as tolerated       VITAL SIGNS: Temp:  [98.8 F (37.1 C)-100.9 F (38.3 C)] 98.8 F (37.1 C) (09/05 0600) Pulse Rate:  [56-98] 61 (09/05 0600) Resp:  [16-18] 18 (09/05 0600) BP: (103-149)/(49-90) 116/54 (09/05 0600) SpO2:  [88 %-100 %] 97 % (09/05 0600) FiO2 (%):  [35 %-40 %] 40 % (09/05 0432)   Intake/Output Summary (Last 24 hours) at 12/20/16 0816 Last data filed at 12/20/16 0600  Gross per 24 hour  Intake          2163.42 ml  Output             1480 ml  Net           683.42 ml     PHYSICAL EXAMINATION: Physical Examination:   VS: BP (!) 116/54   Pulse 61   Temp 98.8 F (37.1 C)   Resp 18   Ht 6\' 2"  (1.88 m)   Wt 290 lb (131.5 kg)   SpO2 97%   BMI 37.23 kg/m     General Appearance: intubated Neuro: sedated but opens eyes to verbal stimuli and moves all  extremities HEENT: ETT + Pulmonary: decreased air entry b/l, no wheezing heard Cardiovascular: Normal S1,S2.  No m/r/g.    Abdomen: Benign, Soft, non-tender, No masses Skin:   warm, no rashes, no ecchymosis  Extremities: normal, no cyanosis, clubbing    LABORATORY PANEL:   CBC  Recent Labs Lab 12/17/16 0305  WBC 12.2*  HGB 9.7*  HCT 30.1*  PLT 375    Chemistries   Recent Labs Lab 12/31/2016 0723  12/18/16 0446  12/20/16 0417  NA 131*  < >  --   < > 140  K 4.4  < >  --   < > 4.1  CL 96*  < >  --   < > 100*  CO2 23  < >  --   < > 31  GLUCOSE 523*  < >  --   < > 219*  BUN 47*  < >  --   < > 41*  CREATININE 2.13*  < >  --   < > 1.44*  CALCIUM 9.2  < >  --   < > 9.0  MG  --   < > 2.1  --   --   PHOS  --   < > 4.5  --  3.8  AST 28  --   --   --   --  ALT 16*  --   --   --   --   ALKPHOS 87  --   --   --   --   BILITOT 0.8  --   --   --   --   < > = values in this interval not displayed.   Recent Labs Lab 12/19/16 1559 12/19/16 1937 12/19/16 2350 12/20/16 0151 12/20/16 0355 12/20/16 0730  GLUCAP 218* 232* 253* 219* 233* 228*    Recent Labs Lab 12/17/16 0935 12/18/16 0315 12/19/16 0332  PHART 7.40 7.37 7.35  PCO2ART 47 54* 60*  PO2ART 67* 70* 75*    Recent Labs Lab 12/25/2016 0723 12/20/16 0417  AST 28  --   ALT 16*  --   ALKPHOS 87  --   BILITOT 0.8  --   ALBUMIN 3.4* 2.5*    Cardiac Enzymes  Recent Labs Lab 12/17/16 0305  TROPONINI 1.35*   Critical Care Time devoted to patient care services described in this note is 32 minutes.   Overall, patient is critically ill, prognosis is guarded.  Patient with Multiorgan failure and at high risk for cardiac arrest and death.    Corrin Parker, M.D.  [facility]  Medical Director [place] Director [facility] Cardio-Pulmonary Department

## 2016-12-20 NOTE — Progress Notes (Signed)
Pt tolerated pressure support and 50% sedation from 1130 until 1220.  Family in room to help him calm.  After 50 minutes his CO2 was in low 60s and he was showing signs of significant distress.  Fulll vent support and full sedation restarted

## 2016-12-20 NOTE — Progress Notes (Signed)
Decreased to 30% 

## 2016-12-20 NOTE — Progress Notes (Signed)
Daily Progress Note   Patient Name: Dennis Mullen       Date: 12/20/2016 DOB: [birth year]  Age: 66 y.o. MRN#: [id number] Attending Physician: Vaughan Basta, * Primary Care Physician: Lavera Guise, MD Admit Date: 01/09/2017  Reason for Consultation/Follow-up: Establishing goals of care  Subjective: Mr. Lobdell continues on vent but weaning better with family at bedside.   Length of Stay: 4  Current Medications: Scheduled Meds:  . chlorhexidine gluconate (MEDLINE KIT)  15 mL Mouth Rinse BID  . clopidogrel  75 mg Per NG tube Daily  . docusate  200 mg Per Tube BID  . enoxaparin (LOVENOX) injection  1 mg/kg Subcutaneous Q12H  . feeding supplement (PRO-STAT SUGAR FREE 64)  60 mL Per Tube TID  . fentaNYL (SUBLIMAZE) injection  50 mcg Intravenous Once  . levothyroxine  50 mcg Per NG tube QAC breakfast  . mouth rinse  15 mL Mouth Rinse 10 times per day  . metoprolol tartrate  25 mg Per NG tube BID  . pantoprazole (PROTONIX) IV  40 mg Intravenous Q24H  . rosuvastatin  20 mg Per Tube QHS  . sennosides  10 mL Per Tube BID    Continuous Infusions: . amiodarone 30 mg/hr (12/20/16 1200)  . feeding supplement (VITAL HIGH PROTEIN) 1,000 mL (12/20/16 1200)  . fentaNYL infusion INTRAVENOUS 75 mcg/hr (12/20/16 1225)  . insulin (NOVOLIN-R) infusion 5.6 Units/hr (12/20/16 1241)  . propofol (DIPRIVAN) infusion 20 mcg/kg/min (12/20/16 1224)  . valproate sodium Stopped (12/20/16 1141)    PRN Meds: acetaminophen, bisacodyl, docusate sodium, fentaNYL  Physical Exam         Constitutional: He appears well-developed. He has a sickly appearance. He is intubated.  HENT:  Head: Normocephalic and atraumatic.  Cardiovascular: Normal rate and regular rhythm.   Pulmonary/Chest: No tachypnea. He  is intubated. No respiratory distress. He has decreased breath sounds in the right lower field and the left lower field.  Abdominal: Soft. Normal appearance.  Neurological:  Sedated on vent  Nursing note and vitals reviewed.  Vital Signs: BP (!) 173/71 (BP Location: Left Arm)   Pulse (!) 105   Temp 99.9 F (37.7 C) (Core (Comment))   Resp 20   Ht 6' 2"  (1.88 m)   Wt 131.5 kg (290 lb)   SpO2 94%   BMI 37.23  kg/m  SpO2: SpO2: 94 % O2 Device: O2 Device: ETT, Ventilator (pressure support) O2 Flow Rate:    Intake/output summary:  Intake/Output Summary (Last 24 hours) at 12/20/16 1303 Last data filed at 12/20/16 1200  Gross per 24 hour  Intake          2395.19 ml  Output             1575 ml  Net           820.19 ml   LBM: Last BM Date: 12/19/16 Baseline Weight: Weight: 131.5 kg (290 lb) Most recent weight: Weight: 131.5 kg (290 lb)       Palliative Assessment/Data: Sedated on vent      Patient Active Problem List   Diagnosis Date Noted  . Acute renal failure superimposed on stage 3 chronic kidney disease ([place])   . Goals of care, counseling/discussion   . Palliative care encounter   . Acute on chronic systolic CHF (congestive heart failure) ([place]) 12/17/2016  . Acute respiratory failure ([place]) 01/03/2017  . Chronic diastolic heart failure ([place]) 12/15/2016  . Atrial fibrillation with rapid ventricular response ([place]) 12/07/2016  . Acute respiratory failure with hypoxia ([place]) 12/02/2016  . Demand ischemia ([place]) 12/02/2016  . Moderate aortic stenosis 12/02/2016  . Hyperkalemia 12/02/2016  . Non-STEMI (non-ST elevated myocardial infarction) ([place]) 11/30/2016  . Lymphedema 03/30/2016  . Left leg cellulitis 03/12/2016  . Insulin overdose 02/02/2016  . Hypoglycemia   . OSA on CPAP   . Depression   . Hypophosphatemia   . SOB (shortness of breath) 10/11/2015  . Elevated troponin 10/11/2015  . Chest pain at rest 09/24/2015  . Elevated PSA 07/26/2015  . Hypogonadism in male  07/26/2015  . 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 BPH with obstruction/lower urinary tract symptoms 06/29/2015  . Bipolar 1 disorder, mixed ([place]) 04/14/2015  . Borderline personality disorder 04/14/2015  . Acute renal failure ([place]) 11/15/2014  . Chest pain 11/14/2014  . Dizziness 11/05/2014  . Benign essential HTN 11/04/2014  . Combined fat and carbohydrate induced hyperlipemia 11/04/2014  . Bipolar 2 disorder ([place]) 10/27/2014  . Acquired hypothyroidism 02/03/2014  . Type 2 diabetes mellitus ([place]) 02/03/2014  . Adiposity 01/06/2014  . Obesity, diabetes, and hypertension syndrome ([place]) 12/23/2013  . Long term current use of insulin ([place]) 12/23/2013  . Type 2 diabetes mellitus with other diabetic neurological complication ([place]) 74/11/1446  . Disorder affecting the body's metabolism 12/23/2013  . Elevated cholesterol with elevated triglycerides 11/18/2013  . CAD (coronary artery disease), native coronary artery 09/26/2013  . Diabetes mellitus ([place]) 09/26/2013  . Non-ST elevation (NSTEMI) myocardial infarction [facility]) 09/26/2013    Palliative Care Assessment & Plan   HPI: 66 y.o. male  with past medical history of bipolar disorder, atrial fibrillation, coronary artery disease, congestive heart failure, NSTEMI, HTN, diabetes mellitus type 2, GERD, sleep apnea, peripheral vascular disease, BPH. 4 admission d/t cardiac decline in the past month with VF arrest 12/08/16 with ROSC admitted on 01/05/2017 with respiratory distress s/t CHF exacerbation with OSA complicated by chronic kidney disease requiring intubation on admission 12/30/2016 and continues to fail to wean. Palliative care requested to assist with [place] with poor prognosis and poor vent weaning.   Assessment: I met again today at Mr. Shouse bedside with son/HCPOA Larkin Ina and Justin's wife. Larkin Ina has better understanding today of his father's poor prognosis and expectations. He also says that he has spoken with his family and  his mother (Mr. Winkleman ex wife) has  shared that Mr. Widmayer has had a DNR in the past and that he has been saying that he knew he did not have much time left to live.   Larkin Ina is considering DNR. I did print copy of Mr. [facility] Living Will and gave copy to Larkin Ina Larkin Ina had not seen this previously). Larkin Ina is named HCPOA in this document and Mr. Micale has indicated he would not want extraordinary measures if death expected in a short time frame. Explained to Larkin Ina that this fits his father's current condition. Justin leaning towards DNR but continues to hope for successful extubation. We were interrupted by a visitor and Larkin Ina will continue to review Living Will and discuss with his family. I will follow up tomorrow.   Recommendations/Plan:  Constipation: Had small BM yest. Continue senokot daily.   Agitation: Titrated fentanyl + propofol per RN/PCCM.   Goals of Care and Additional Recommendations:  Limitations on Scope of Treatment: Full Scope Treatment  Code Status:  Full code - family considering DNR  Prognosis:   Unable to determine - but prognosis very poor given recent progression and history. Possibly eligible for hospice facility.   Discharge Planning:  To Be Determined  Care plan was discussed with Dr. Mortimer Fries, Dr. Anselm Jungling.   Thank you for allowing the Palliative Medicine Team to assist in the care of this patient.   Total Time 24mn Prolonged Time Billed  no       Greater than 50%  of this time was spent counseling and coordinating care related to the above assessment and plan.  AVinie Sill NP Palliative Medicine Team Pager # [phone](M-F 8a-5p) Team Phone # [phone](Nights/Weekends)

## 2016-12-20 NOTE — Consult Note (Signed)
[facility] NOTE  Pharmacy Consult for electrolytes/constipation prevention    No Known Allergies  Patient Measurements: Height: 6' 2"  (188 cm) Weight: 290 lb (131.5 kg) IBW/kg (Calculated) : 82.2  Vital Signs: Temp: 100 F (37.8 C) (09/05 1800) Temp Source: Core (Comment) (09/05 1600) BP: 133/65 (09/05 1800) Pulse Rate: 51 (09/05 1800) Intake/Output from previous day: 09/04 0701 - 09/05 0700 In: 2163.4 [I.V.:1098.1; NG/GT:965.3; IV Piggyback:100] Out: 0454 [Urine:1730] Intake/Output from this shift: No intake/output data recorded. Vent settings for last 24 hours: Vent Mode: PRVC FiO2 (%):  [30 %-40 %] 30 % Set Rate:  [18 bmp] 18 bmp Vt Set:  [500 mL] 500 mL PEEP:  [5 cmH20] 5 cmH20 Pressure Support:  [5 cmH20] 5 cmH20 Plateau Pressure:  [16 cmH20] 16 cmH20  Labs:  Recent Labs  12/18/16 0421 12/18/16 0446 12/19/16 0522 12/20/16 0417 12/20/16 0431  CREATININE 2.06*  --  1.54* 1.44*  --   LABCREA  --   --   --   --  136  MG  --  2.1  --   --   --   PHOS  --  4.5  --  3.8  --   ALBUMIN  --   --   --  2.5*  --    Estimated Creatinine Clearance: 74.7 mL/min (A) (by C-G formula based on SCr of 1.44 mg/dL (H)).   Recent Labs  12/20/16 1703 12/20/16 1759 12/20/16 1908  GLUCAP 191* 181* 182*     Medications:  Scheduled:  . chlorhexidine gluconate (MEDLINE KIT)  15 mL Mouth Rinse BID  . clopidogrel  75 mg Per NG tube Daily  . docusate  200 mg Per Tube BID  . enoxaparin (LOVENOX) injection  1 mg/kg Subcutaneous Q12H  . feeding supplement (PRO-STAT SUGAR FREE 64)  60 mL Per Tube TID  . fentaNYL (SUBLIMAZE) injection  50 mcg Intravenous Once  . levothyroxine  50 mcg Per NG tube QAC breakfast  . mouth rinse  15 mL Mouth Rinse 10 times per day  . metoprolol tartrate  25 mg Per NG tube BID  . pantoprazole (PROTONIX) IV  40 mg Intravenous Q24H  . rosuvastatin  20 mg Per Tube QHS  . sennosides  10 mL Per Tube BID    Assessment: Pharmacy  consulted for electrolyte and constipation management for 66 yo male ICU patient admitted with respiratory distress. Patient is currently sedated on fentanyl and propofol.   Plan:  1. Electrolytes: no replacement warranted at this time. Will obtain follow up electrolytes with am labs.   2. Constipation: continue docusate 263m VT BID and senna 8.837mVT BID.    Pharmacy will continue to monitor and adjust per consult.   MLS  12/20/16 7:25 PM

## 2016-12-20 NOTE — Progress Notes (Signed)
Patient unable to tolerate first WUA, tolerated second one for 50 minutes. See previous note.  Pt converted to Afib during second WUA, converted back to SR w/ BBB at 1450, but converted back to AFib approx 1630 during bath.  Pt tends to be minimally responsive with adequate sedation to keep him calm, tries to open eyes to voice but does not follow motor commands or move.  Attempting to titrate fentanyl down to 50 mcg to keep him calm but allow greater responsiveness.

## 2016-12-20 NOTE — Progress Notes (Signed)
Placed patient back into PRVC at previous settings, please see weaning documentation at [phone].

## 2016-12-20 NOTE — Care Management (Signed)
RNCM discussed LTAC option with care team during progression. [facility] to [facility] for review of coverage. He meets LTAC criteria however will need trach before his insurance will consider- and there may be a 21 day window.  RNCM will follow. Family has not been involved as there is still decisions to be made regarding progression of care/disposition with family per MD.

## 2016-12-21 ENCOUNTER — Inpatient Hospital Stay: Payer: Medicare Other

## 2016-12-21 DIAGNOSIS — Z7189 Other specified counseling: Secondary | ICD-10-CM

## 2016-12-21 DIAGNOSIS — Z515 Encounter for palliative care: Secondary | ICD-10-CM

## 2016-12-21 LAB — BLOOD GAS, ARTERIAL
Acid-Base Excess: 7 mmol/L — ABNORMAL HIGH (ref 0.0–2.0)
Bicarbonate: 35 mmol/L — ABNORMAL HIGH (ref 20.0–28.0)
FIO2: 0.4
MECHANICAL RATE: 18
MECHVT: 500 mL
Map: 14 cmH20
O2 SAT: 95.1 %
PATIENT TEMPERATURE: 37
PEEP/CPAP: 5 cmH2O
PIP: 49 cmH2O
PO2 ART: 82 mmHg — AB (ref 83.0–108.0)
RATE: 28 resp/min
pCO2 arterial: 68 mmHg (ref 32.0–48.0)
pH, Arterial: 7.32 — ABNORMAL LOW (ref 7.350–7.450)

## 2016-12-21 LAB — GLUCOSE, CAPILLARY
GLUCOSE-CAPILLARY: 146 mg/dL — AB (ref 65–99)
GLUCOSE-CAPILLARY: 150 mg/dL — AB (ref 65–99)
GLUCOSE-CAPILLARY: 153 mg/dL — AB (ref 65–99)
GLUCOSE-CAPILLARY: 155 mg/dL — AB (ref 65–99)
GLUCOSE-CAPILLARY: 165 mg/dL — AB (ref 65–99)
GLUCOSE-CAPILLARY: 166 mg/dL — AB (ref 65–99)
GLUCOSE-CAPILLARY: 171 mg/dL — AB (ref 65–99)
GLUCOSE-CAPILLARY: 172 mg/dL — AB (ref 65–99)
GLUCOSE-CAPILLARY: 174 mg/dL — AB (ref 65–99)
GLUCOSE-CAPILLARY: 176 mg/dL — AB (ref 65–99)
GLUCOSE-CAPILLARY: 177 mg/dL — AB (ref 65–99)
GLUCOSE-CAPILLARY: 188 mg/dL — AB (ref 65–99)
Glucose-Capillary: 156 mg/dL — ABNORMAL HIGH (ref 65–99)
Glucose-Capillary: 152 mg/dL — ABNORMAL HIGH (ref 65–99)
Glucose-Capillary: 165 mg/dL — ABNORMAL HIGH (ref 65–99)
Glucose-Capillary: 160 mg/dL — ABNORMAL HIGH (ref 65–99)
Glucose-Capillary: 169 mg/dL — ABNORMAL HIGH (ref 65–99)
Glucose-Capillary: 158 mg/dL — ABNORMAL HIGH (ref 65–99)
Glucose-Capillary: 145 mg/dL — ABNORMAL HIGH (ref 65–99)
Glucose-Capillary: 202 mg/dL — ABNORMAL HIGH (ref 65–99)
Glucose-Capillary: 161 mg/dL — ABNORMAL HIGH (ref 65–99)
Glucose-Capillary: 172 mg/dL — ABNORMAL HIGH (ref 65–99)
Glucose-Capillary: 145 mg/dL — ABNORMAL HIGH (ref 65–99)
Glucose-Capillary: 154 mg/dL — ABNORMAL HIGH (ref 65–99)

## 2016-12-21 LAB — BASIC METABOLIC PANEL
Anion gap: 9 (ref 5–15)
BUN: 51 mg/dL — ABNORMAL HIGH (ref 6–20)
CO2: 31 mmol/L (ref 22–32)
CREATININE: 1.32 mg/dL — AB (ref 0.61–1.24)
Calcium: 8.9 mg/dL (ref 8.9–10.3)
Chloride: 100 mmol/L — ABNORMAL LOW (ref 101–111)
GFR, EST NON AFRICAN AMERICAN: 55 mL/min — AB (ref >60)
Glucose, Bld: 156 mg/dL — ABNORMAL HIGH (ref 65–99)
POTASSIUM: 4.2 mmol/L (ref 3.5–5.1)
SODIUM: 140 mmol/L (ref 135–145)

## 2016-12-21 LAB — CULTURE, RESPIRATORY W GRAM STAIN

## 2016-12-21 LAB — CULTURE, BLOOD (ROUTINE X 2)
CULTURE: NO GROWTH
CULTURE: NO GROWTH
SPECIAL REQUESTS: ADEQUATE
Special Requests: ADEQUATE

## 2016-12-21 LAB — CULTURE, RESPIRATORY: CULTURE: NO GROWTH

## 2016-12-21 MED ORDER — METHYLPREDNISOLONE SODIUM SUCC 125 MG IJ SOLR
INTRAMUSCULAR | Status: AC
  Administered 2016-12-21: 80 mg via INTRAVENOUS
  Filled 2016-12-21: qty 2

## 2016-12-21 MED ORDER — METHYLPREDNISOLONE SODIUM SUCC 125 MG IJ SOLR: 60.0000 mg | Freq: Four times a day (QID) | INTRAMUSCULAR | Status: DC

## 2016-12-21 MED ORDER — IPRATROPIUM-ALBUTEROL 0.5-2.5 (3) MG/3ML IN SOLN
3.0000 mL | RESPIRATORY_TRACT | Status: DC | PRN
  Filled 2016-12-21: qty 3

## 2016-12-21 MED ORDER — IPRATROPIUM-ALBUTEROL 0.5-2.5 (3) MG/3ML IN SOLN
RESPIRATORY_TRACT | Status: AC
Start: 2016-12-21 — End: 2016-12-21
  Administered 2016-12-21: 6 mL
  Filled 2016-12-21: qty 6

## 2016-12-21 MED ORDER — ROCURONIUM BROMIDE 50 MG/5ML IV SOLN
0.5000 mg/kg | Freq: Once | INTRAVENOUS | Status: AC
Start: 2016-12-21 — End: 2016-12-21
  Administered 2016-12-21: 65.8 mg via INTRAVENOUS
  Filled 2016-12-21: qty 2

## 2016-12-21 MED ORDER — PANTOPRAZOLE SODIUM 40 MG PO PACK
40.0000 mg | PACK | Freq: Every day | ORAL | Status: DC
  Administered 2016-12-21 – 2016-12-23 (×3): 40 mg
  Filled 2016-12-21 (×5): qty 20

## 2016-12-21 MED ORDER — METHYLPREDNISOLONE SODIUM SUCC 125 MG IJ SOLR
80.0000 mg | Freq: Once | INTRAMUSCULAR | Status: AC
Start: 2016-12-21 — End: 2016-12-21
  Administered 2016-12-21: 80 mg via INTRAVENOUS

## 2016-12-21 MED ORDER — FUROSEMIDE 10 MG/ML IJ SOLN
40.0000 mg | Freq: Once | INTRAMUSCULAR | Status: AC
  Administered 2016-12-21: 40 mg via INTRAVENOUS
  Filled 2016-12-21: qty 4

## 2016-12-21 MED ORDER — METHYLPREDNISOLONE SODIUM SUCC 125 MG IJ SOLR
60.0000 mg | Freq: Four times a day (QID) | INTRAMUSCULAR | Status: DC
  Administered 2016-12-22 (×3): 60 mg via INTRAVENOUS
  Filled 2016-12-21 (×3): qty 2

## 2016-12-21 MED ORDER — IPRATROPIUM-ALBUTEROL 0.5-2.5 (3) MG/3ML IN SOLN
3.0000 mL | RESPIRATORY_TRACT | Status: DC
  Administered 2016-12-21 – 2016-12-25 (×21): 3 mL via RESPIRATORY_TRACT
  Filled 2016-12-21 (×20): qty 3

## 2016-12-21 NOTE — Progress Notes (Signed)
Daily Progress Note   Patient Name: Dennis Mullen       Date: 12/21/2016 DOB: 08/12/1950  Age: 66 y.o. MRN#: [id number] Attending Physician: Vaughan Basta, * Primary Care Physician: Lavera Guise, MD Admit Date: 12/19/2016  Reason for Consultation/Follow-up: Establishing goals of care  Subjective: Dennis Mullen is still agitated and using accessory muscles after trial of weaning (< 10 min).   Length of Stay: 5  Current Medications: Scheduled Meds:  . chlorhexidine gluconate (MEDLINE KIT)  15 mL Mouth Rinse BID  . clopidogrel  75 mg Per NG tube Daily  . docusate  200 mg Per Tube BID  . enoxaparin (LOVENOX) injection  1 mg/kg Subcutaneous Q12H  . feeding supplement (PRO-STAT SUGAR FREE 64)  60 mL Per Tube TID  . fentaNYL (SUBLIMAZE) injection  50 mcg Intravenous Once  . furosemide  40 mg Intravenous Once  . levothyroxine  50 mcg Per NG tube QAC breakfast  . mouth rinse  15 mL Mouth Rinse 10 times per day  . metoprolol tartrate  25 mg Per NG tube BID  . pantoprazole sodium  40 mg Per Tube Daily  . rosuvastatin  20 mg Per Tube QHS  . sennosides  10 mL Per Tube BID    Continuous Infusions: . amiodarone 30 mg/hr (12/21/16 0800)  . feeding supplement (VITAL HIGH PROTEIN) 1,000 mL (12/21/16 0800)  . fentaNYL infusion INTRAVENOUS 30 mcg/hr (12/21/16 0923)  . insulin (NOVOLIN-R) infusion 7.1 Units/hr (12/21/16 1001)  . propofol (DIPRIVAN) infusion 15 mcg/kg/min (12/21/16 0924)  . valproate sodium 500 mg (12/21/16 0917)    PRN Meds: acetaminophen, bisacodyl, docusate sodium, fentaNYL  Physical Exam         Constitutional: He appears well-developed. He has a sickly appearance. He is intubated.  HENT:  Head: Normocephalicand atraumatic.  Cardiovascular: Normal rateand  regular rhythm.  Pulmonary/Chest: No tachypnea. He is intubated. Moderate respiratory distress.  Abdominal: Soft. Normal appearance.  Neurological:  Sedated on vent Nursing noteand vitalsreviewed.  Vital Signs: BP (!) 161/64 (BP Location: Left Arm)   Pulse 65   Temp 99.3 F (37.4 C) (Core (Comment))   Resp 18   Ht 6' 2"  (1.88 m)   Wt 131.5 kg (290 lb)   SpO2 96%   BMI 37.23 kg/m  SpO2: SpO2: 96 % O2 Device: O2  Device: ETT, Ventilator O2 Flow Rate:    Intake/output summary:  Intake/Output Summary (Last 24 hours) at 12/21/16 1008 Last data filed at 12/21/16 0800  Gross per 24 hour  Intake          2682.12 ml  Output             1375 ml  Net          1307.12 ml   LBM: Last BM Date: 12/20/16 Baseline Weight: Weight: 131.5 kg (290 lb) Most recent weight: Weight: 131.5 kg (290 lb)       Palliative Assessment/Data:      Patient Active Problem List   Diagnosis Date Noted  . Ventilator dependence ([place])   . Acute renal failure superimposed on stage 3 chronic kidney disease ([place])   . Goals of care, counseling/discussion   . Palliative care encounter   . Acute on chronic systolic CHF (congestive heart failure) ([place]) 12/28/2016  . Acute respiratory failure ([place]) 12/20/2016  . Chronic diastolic heart failure ([place]) 12/15/2016  . Atrial fibrillation with rapid ventricular response ([place]) 12/07/2016  . Acute respiratory failure with hypoxia ([place]) 12/02/2016  . Demand ischemia ([place]) 12/02/2016  . Moderate aortic stenosis 12/02/2016  . Hyperkalemia 12/02/2016  . Non-STEMI (non-ST elevated myocardial infarction) ([place]) 11/30/2016  . Lymphedema 03/30/2016  . Left leg cellulitis 03/12/2016  . Insulin overdose 02/02/2016  . Hypoglycemia   . OSA on CPAP   . Depression   . Hypophosphatemia   . SOB (shortness of breath) 10/11/2015  . Elevated troponin 10/11/2015  . Chest pain at rest 09/24/2015  . Elevated PSA 07/26/2015  . Hypogonadism in male 07/26/2015  . Erectile  dysfunction of organic origin 06/29/2015  . BPH with obstruction/lower urinary tract symptoms 06/29/2015  . Bipolar 1 disorder, mixed ([place]) 04/14/2015  . Borderline personality disorder 04/14/2015  . Acute renal failure ([place]) 11/15/2014  . Chest pain 11/14/2014  . Dizziness 11/05/2014  . Benign essential HTN 11/04/2014  . Combined fat and carbohydrate induced hyperlipemia 11/04/2014  . Bipolar 2 disorder ([place]) 10/27/2014  . Acquired hypothyroidism 02/03/2014  . Type 2 diabetes mellitus ([place]) 02/03/2014  . Adiposity 01/06/2014  . Obesity, diabetes, and hypertension syndrome ([place]) 12/23/2013  . Long term current use of insulin ([place]) 12/23/2013  . Type 2 diabetes mellitus with other diabetic neurological complication ([place]) 78/29/5621  . Disorder affecting the body's metabolism 12/23/2013  . Elevated cholesterol with elevated triglycerides 11/18/2013  . CAD (coronary artery disease), native coronary artery 09/26/2013  . Diabetes mellitus ([place]) 09/26/2013  . Non-ST elevation (NSTEMI) myocardial infarction ([place]) 09/26/2013    Palliative Care Assessment & Plan   HPI: 66 y.o.malewith past medical history of bipolar disorder, atrial fibrillation, coronary artery disease, congestive heart failure, NSTEMI, HTN, diabetes mellitus type 2, GERD,sleep apnea, peripheral vascular disease, BPH. 4 admission d/t cardiac decline in the past month with VF arrest 12/08/16 with ROSCadmitted on 09/03/2018with respiratory distress s/t CHF exacerbation with OSA complicated by chronic kidney disease requiring intubation on admission 01/02/2017 and continues to fail to wean. Palliative care requested to assist with J[place] with poor prognosis and poor vent weaning.   Assessment: I met again today with son/HCPOA Dennis Mullen and Dennis Mullen. Dennis Mullen has sorted through Dennis Mullen's Living Will and paper work and after some discussion has come to the conclusion that Dennis Mullen would not wish to be resuscitated with further decline.  Dennis Mullen struggles as he wants Dennis Mullen to live but also wants to follow Dennis wishes.  Dennis Mullen wishes to give Dennis Mullen the best chance at more life. We did discuss that if he cannot successfully wean they will be faced with one way extubation to comfort. We discussed starting to prepare for this happening. They hope to try and ask Dennis Mullen Dennis wishes (regarding extubation) if they get the opportunity - unfortunately he is still recovering from Dennis wean attempt.   I spoke with Dennis Mullen privately and she was asking about realistic expectations. We discussed that Dennis prognosis is very poor and even if he does come off the vent it is only a matter of time before Dennis next decline. Also shared that he may not survive to leave the hospital. She is trying to prepare and support Dennis Mullen and their family through this.   Emotional support provided.   Recommendations/Plan:  Constipation: Had small BM yest. Continue senokot daily.   Agitation: Titrated fentanyl + propofol per RN/PCCM.   Code Status:  DNR  Prognosis:   Unable to determine- but prognosis very poor given recent progression and history. Possibly eligible for hospice facility.   Discharge Planning:  To Be Determined  Care plan was discussed with Dr. Mortimer Fries  Thank you for allowing the Palliative Medicine Team to assist in the care of this patient.   Total Time 40 min Prolonged Time Billed  no       Greater than 50%  of this time was spent counseling and coordinating care related to the above assessment and plan.  Vinie Sill, NP Palliative Medicine Team Pager # [phone] (M-F 8a-5p) Team Phone # [phone] (Nights/Weekends)

## 2016-12-21 NOTE — Progress Notes (Addendum)
Pt agitated and struggling on pressure support with no sedation, using accessory muscles in neck to pull air, converted from NSR in 60s to AFib in 120s, fent bolus given, Weaning trial failed per Dr Mortimer Fries, restart sedation and vent rate.

## 2016-12-21 NOTE — Progress Notes (Signed)
eLink Physician-Brief Progress Note Patient Name: Dennis Mullen DOB: October 22, 1950 MRN: [id number]   Date of Service  12/21/2016  HPI/Events of Note  Patient not getting set ventilating volumes. High Ppeak on mechanical ventilator. Nurse states that patient isn't biting on ETT. Patient appears well sedated. Reports some difficulty in passing ETT.   eICU Interventions  Will order: 1. ABG STAT.  2. CXR STAT. 3 Asked nurse and RT to saline lavage the patient via ETT.      Intervention Category Major Interventions: Respiratory failure - evaluation and management  Vandy Fong [place] 12/21/2016, 8:44 PM

## 2016-12-21 NOTE — Progress Notes (Signed)
Pt's vent was changed to Pressure Control mode per [place], NP  Vent setting are: PC 30, rr18, Peep +8, fio2 40%.  Will get abg in 1 hr.

## 2016-12-21 NOTE — Progress Notes (Signed)
ETT appears to have migrated out 2 cms, VSS, O2 sats in high 90s, no adventitious breath sounds.  Dr Mortimer Fries aware, no new orders at this time

## 2016-12-21 NOTE — Consult Note (Signed)
[facility] NOTE  Pharmacy Consult for electrolytes/constipation prevention    No Known Allergies  Patient Measurements: Height: _0  (188 cm) Weight: 290 lb (131.5 kg) IBW/kg (Calculated) : 82.2  Vital Signs: Temp: 99.7 F (37.6 C) (09/06 1100) Temp Source: Core (Comment) (09/06 0800) BP: 162/88 (09/06 1100) Pulse Rate: 95 (09/06 1100) Intake/Output from previous day: 09/05 0701 - 09/06 0700 In: 2653.1 [I.V.:1280.5; NG/GT:1292.7; IV Piggyback:50] Out: 8502 [Urine:1475] Intake/Output from this shift: Total I/O In: 446 [I.V.:98.7; NG/GT:297.3; IV Piggyback:50] Out: 70 [Urine:70] Vent settings for last 24 hours: Vent Mode: PRVC FiO2 (%):  [30 %] 30 % Set Rate:  [18 bmp] 18 bmp Vt Set:  [500 mL] 500 mL PEEP:  [5 cmH20] 5 cmH20 Pressure Support:  [5 cmH20] 5 cmH20  Labs:  Recent Labs  12/19/16 0522 12/20/16 0417 12/20/16 0431 12/21/16 0345  CREATININE 1.54* 1.44*  --  1.32*  LABCREA  --   --  136  --   PHOS  --  3.8  --   --   ALBUMIN  --  2.5*  --   --    Estimated Creatinine Clearance: 81.5 mL/min (A) (by C-G formula based on SCr of 1.32 mg/dL (H)).   Recent Labs  12/21/16 0754 12/21/16 0908 12/21/16 1000  GLUCAP 153* 169* 158*     Medications:  Scheduled:  . chlorhexidine gluconate (MEDLINE KIT)  15 mL Mouth Rinse BID  . clopidogrel  75 mg Per NG tube Daily  . docusate  200 mg Per Tube BID  . enoxaparin (LOVENOX) injection  1 mg/kg Subcutaneous Q12H  . feeding supplement (PRO-STAT SUGAR FREE 64)  60 mL Per Tube TID  . fentaNYL (SUBLIMAZE) injection  50 mcg Intravenous Once  . levothyroxine  50 mcg Per NG tube QAC breakfast  . mouth rinse  15 mL Mouth Rinse 10 times per day  . metoprolol tartrate  25 mg Per NG tube BID  . pantoprazole sodium  40 mg Per Tube Daily  . rosuvastatin  20 mg Per Tube QHS  . sennosides  10 mL Per Tube BID    Assessment: Pharmacy consulted for electrolyte and constipation management for 66 yo  male ICU patient admitted with respiratory distress. Patient is currently sedated on fentanyl and propofol. Patient was given 40 mg IV furosemide x 1 dose.   Plan:  1. Electrolytes: no replacement warranted at this time. Will obtain follow up electrolytes with am labs.   2. Constipation: continue docusate 271m VT BID and senna 8.81mVT BID.    Pharmacy will continue to monitor and adjust per consult.   MeDurwin RegesPharmD Student  12/21/2016  [phone]

## 2016-12-21 NOTE — Progress Notes (Signed)
ICU NP in procedure. Spoke with [facility] doctor, patient alarming ventilator. Patient peak pressures on ventilator maintaining elevated around 50. Respiratory therapy called into room. Patient comfortable resting in bed. Received new orders for chest xray, will have ICU NP evaluate once out of procedure.

## 2016-12-21 NOTE — Progress Notes (Signed)
[facility] Critical Care Medicine Progess Note     ASSESSMENT/PLAN   66 year old morbidly obese white male admitted to the ICU for acute respiratory failure secondary to CHF exacerbation in the setting of obstructive sleep apnea was intubated after failing BiPAP now on full vent support Patient failed spontaneous weaning trial yesterday we'll try again today with family at bedside  ACUTE HYPOXIC HYPERCAPNIC RESPIRATORY FAILURE FLUID OVERLOAD IN PT WITH CHF / AFIB / + TROPONIN ACUTE KIDNEY INJURY    PLAN Weaning trial and extubation if tolerated.  Antibiotics stopped this process is most likely related to cardiogenic pulmonary edema and CHF with non-STEMI Cardiology/Nephrology following. Rate control as per cards. Glycemic control. Tylenol prn. DVT/GI prophylaxis. Full Code. No family at bedside.    SUBJECTIVE:  Patient seen and examined at bedside.    Elevated troponin- cards following.  CxR today was reviewed independently and shows left lower lobe consolidation with ? Effusion. Patient remains critically ill Patient on full vent support Plan for weaning sedation as tolerated       VITAL SIGNS: Temp:  [99 F (37.2 C)-100.6 F (38.1 C)] 99.3 F (37.4 C) (09/06 0800) Pulse Rate:  [48-115] 65 (09/06 0800) Resp:  [17-20] 18 (09/06 0800) BP: (87-173)/(49-151) 161/64 (09/06 0800) SpO2:  [94 %-100 %] 96 % (09/06 0800) FiO2 (%):  [30 %] 30 % (09/06 0800)   Intake/Output Summary (Last 24 hours) at 12/21/16 0836 Last data filed at 12/21/16 0800  Gross per 24 hour  Intake          2682.12 ml  Output             1375 ml  Net          1307.12 ml     PHYSICAL EXAMINATION: Physical Examination:   VS: BP (!) 161/64 (BP Location: Left Arm)   Pulse 65   Temp 99.3 F (37.4 C) (Core (Comment))   Resp 18   Ht 6\' 2"  (1.88 m)   Wt 290 lb (131.5 kg)   SpO2 96%   BMI 37.23 kg/m    General Appearance: intubated Neuro: sedated but opens eyes to verbal stimuli  and moves all extremities HEENT: ETT + Pulmonary: decreased air entry b/l, no wheezing heard Cardiovascular: Normal S1,S2.  No m/r/g.    Abdomen: Benign, Soft, non-tender, No masses Skin:   warm, no rashes, no ecchymosis  Extremities: normal, no cyanosis, clubbing    LABORATORY PANEL:   CBC  Recent Labs Lab 12/17/16 0305  WBC 12.2*  HGB 9.7*  HCT 30.1*  PLT 375    Chemistries   Recent Labs Lab 01/08/2017 0723  12/18/16 0446  12/20/16 0417 12/21/16 0345  NA 131*  < >  --   < > 140 140  K 4.4  < >  --   < > 4.1 4.2  CL 96*  < >  --   < > 100* 100*  CO2 23  < >  --   < > 31 31  GLUCOSE 523*  < >  --   < > 219* 156*  BUN 47*  < >  --   < > 41* 51*  CREATININE 2.13*  < >  --   < > 1.44* 1.32*  CALCIUM 9.2  < >  --   < > 9.0 8.9  MG  --   < > 2.1  --   --   --   PHOS  --   < > 4.5  --  3.8  --   AST 28  --   --   --   --   --   ALT 16*  --   --   --   --   --   ALKPHOS 87  --   --   --   --   --   BILITOT 0.8  --   --   --   --   --   < > = values in this interval not displayed.   Recent Labs Lab 12/21/16 0157 12/21/16 0304 12/21/16 0353 12/21/16 0510 12/21/16 0603 12/21/16 0705  GLUCAP 156* 152* 146* 165* 160* 150*    Recent Labs Lab 12/17/16 0935 12/18/16 0315 12/19/16 0332  PHART 7.40 7.37 7.35  PCO2ART 47 54* 60*  PO2ART 67* 70* 75*    Recent Labs Lab 12/29/2016 0723 12/20/16 0417  AST 28  --   ALT 16*  --   ALKPHOS 87  --   BILITOT 0.8  --   ALBUMIN 3.4* 2.5*    Cardiac Enzymes  Recent Labs Lab 12/17/16 0305  TROPONINI 1.35*    Critical Care Time devoted to patient care services described in this note is 35 minutes.   Overall, patient is critically ill, prognosis is guarded.  Patient with Multiorgan failure and at high risk for cardiac arrest and death.    Corrin Parker, M.D.  [facility]  Medical Director [place] Director [facility] Cardio-Pulmonary Department

## 2016-12-21 NOTE — Progress Notes (Addendum)
NP Hinton Dyer notified of continued fever, she will order pan cultures.Marland Kitchen Writer has placed ice packs to axillae and groin.  He seems to have minimal response to APAP

## 2016-12-21 NOTE — Progress Notes (Signed)
[place] at [place] NAME: Dennis Mullen    MR#:  [id number]  DATE OF BIRTH:  12-Apr-1951  SUBJECTIVE:  CHIEF COMPLAINT:   Chief Complaint  Patient presents with  . Respiratory Distress     Came with respi distress and intubated, have CHF, renal failure, acidosis.    Remains on vent. Have good diuresis, renal func better.   Failed weaning trials.  REVIEW OF SYSTEMS:  Intubated, so can not give much details, sedated. ROS  DRUG ALLERGIES:  No Known Allergies  VITALS:  Blood pressure (!) 161/64, pulse 65, temperature 99.3 F (37.4 C), temperature source Core (Comment), resp. rate 18, height 6\' 2"  (1.88 m), weight 131.5 kg (290 lb), SpO2 96 %.  PHYSICAL EXAMINATION:    GENERAL:  66 y.o.-year-old patient lying in the bed with acute distress, critical appearing. EYES: Pupils equal, round, reactive to light. No scleral icterus. Extraocular muscles did not check, pt is sedated.  HEENT: Head atraumatic, normocephalic. Oropharynx and nasopharynx clear.  NECK:  Supple, no jugular venous distention. No thyroid enlargement, no tenderness.  LUNGS: Normal breath sounds bilaterally, no wheezing, b/l crepitation. No use of accessory muscles of respiration. On vent support via ET tube. CARDIOVASCULAR: S1, S2 normal. No murmurs, rubs, or gallops.  ABDOMEN: Soft, nontender, nondistended. Bowel sounds present. No organomegaly or mass.  EXTREMITIES: No pedal edema, cyanosis, or clubbing.  NEUROLOGIC: pt is on vent support, sedated. PSYCHIATRIC: sedated on vent.  SKIN: No obvious rash, lesion, or ulcer.   Physical Exam LABORATORY PANEL:   CBC  Recent Labs Lab 12/17/16 0305  WBC 12.2*  HGB 9.7*  HCT 30.1*  PLT 375   ------------------------------------------------------------------------------------------------------------------  Chemistries   Recent Labs Lab 01/04/2017 0723  12/18/16 0446  12/21/16 0345  NA 131*  < >  --   < > 140  K  4.4  < >  --   < > 4.2  CL 96*  < >  --   < > 100*  CO2 23  < >  --   < > 31  GLUCOSE 523*  < >  --   < > 156*  BUN 47*  < >  --   < > 51*  CREATININE 2.13*  < >  --   < > 1.32*  CALCIUM 9.2  < >  --   < > 8.9  MG  --   < > 2.1  --   --   AST 28  --   --   --   --   ALT 16*  --   --   --   --   ALKPHOS 87  --   --   --   --   BILITOT 0.8  --   --   --   --   < > = values in this interval not displayed. ------------------------------------------------------------------------------------------------------------------  Cardiac Enzymes  Recent Labs Lab 01/11/2017 2116 12/17/16 0305  TROPONINI 1.25* 1.35*   ------------------------------------------------------------------------------------------------------------------  RADIOLOGY:  No results found.  ASSESSMENT AND PLAN:   Active Problems:   Acute respiratory failure with hypoxia (HCC)   Acute on chronic systolic CHF (congestive heart failure) (HCC)   Acute respiratory failure (HCC)   Acute renal failure superimposed on stage 3 chronic kidney disease (HCC)   Goals of care, counseling/discussion   Palliative care encounter   Ventilator dependence ([place])  * Ac respi failure hypoxic   Ac on ch systolic CHF   HCA  pneumonia- due to repeated admissions- suspected initially,, but ruled out , intencivist stopped Abx.   S/p intubation   Intencivist and cardio consult.    IV lasix, I/o monitor, renal func monitor,.have good diuresis.   Appreciated ICU physician help,    vanc + zosyn, awaited cx, procalcitonin is high.- stopped Abx.   Now held lasix.    Very high risk for recurrent symptoms and reintubation, failed trials, poor prognosis- caleld pallaitive care, I also spoke to his son in room.  * Uncontrolled DM    Removed his Insuline delivery device    on sliding scale coverage now.   No DKA currently.   Hold oral meds    Now more controlled insulin drip.  * lactic acidosis    Likely due to CHF    Cont to monitor with  vent support.   Improved.  * Uncontrolled Htn   BP was 096 systolic, ER started on nitro drip    Had drop in BP, so stopped. Now stable.  * hypothyroidism     Cont TSH.  * Elevated troponin   Likely coming down from NSTEMI last week.   Monitor every 4 hours.    Now rising some, appreciated cardio help, no further intervention.  * Ac renal failure on CKD stage 3   Monitor closely with lasix use.   Appreciated help by nephrology.    Slightly improved, hold lasix now.   * CAD   Cont asa, plavix, metoprolol, rosuvastatin  *  A fib   COnt metoprolol and amiodarone.   Eliquis changed to lovenox [place] for now.    On amio drip now.    All the records are reviewed and case discussed with Care Management/Social Workerr. Management plans discussed with the patient, family and they are in agreement.  CODE STATUS: Full.  TOTAL TIME TAKING CARE OF THIS PATIENT: 35 minutes.   Prognosis very poor, palliative care involved.  POSSIBLE D/C IN 1-2 DAYS, DEPENDING ON CLINICAL CONDITION.   Vaughan Basta M.D on 12/21/2016   Between 7am to 6pm - Pager - [phone]  After 6pm go to [email or web address] - password [email or web address] [facility]  Office  [phone]  CC: Primary care physician; Lavera Guise, MD  Note: This dictation was prepared with Dragon dictation along with smaller phrase technology. Any transcriptional errors that result from this process are unintentional.

## 2016-12-22 ENCOUNTER — Inpatient Hospital Stay: Payer: Medicare Other

## 2016-12-22 ENCOUNTER — Ambulatory Visit: Payer: Self-pay | Admitting: *Deleted

## 2016-12-22 DIAGNOSIS — Z9911 Dependence on respirator [ventilator] status: Secondary | ICD-10-CM

## 2016-12-22 LAB — BLOOD GAS, ARTERIAL
ACID-BASE EXCESS: 6.6 mmol/L — AB (ref 0.0–2.0)
BICARBONATE: 34.5 mmol/L — AB (ref 20.0–28.0)
FIO2: 0.4
Map: 10 cmH20
Mechanical Rate: 4
O2 SAT: 98.2 %
PCO2 ART: 64 mmHg — AB (ref 32.0–48.0)
PEEP/CPAP: 5 cmH2O
PIP: 35 cmH2O
PRESSURE SUPPORT: 30 cmH2O
Patient temperature: 37
Pressure control: 30 cmH2O
RATE: 12 resp/min
pH, Arterial: 7.34 — ABNORMAL LOW (ref 7.350–7.450)
pO2, Arterial: 113 mmHg — ABNORMAL HIGH (ref 83.0–108.0)

## 2016-12-22 LAB — BASIC METABOLIC PANEL
ANION GAP: 12 (ref 5–15)
ANION GAP: 11 (ref 5–15)
BUN: 62 mg/dL — ABNORMAL HIGH (ref 6–20)
BUN: 80 mg/dL — AB (ref 6–20)
CALCIUM: 9.2 mg/dL (ref 8.9–10.3)
CHLORIDE: 98 mmol/L — AB (ref 101–111)
CO2: 31 mmol/L (ref 22–32)
CO2: 32 mmol/L (ref 22–32)
CREATININE: 2.04 mg/dL — AB (ref 0.61–1.24)
CREATININE: 1.84 mg/dL — AB (ref 0.61–1.24)
Calcium: 9.1 mg/dL (ref 8.9–10.3)
Chloride: 99 mmol/L — ABNORMAL LOW (ref 101–111)
GFR calc Af Amer: 43 mL/min — ABNORMAL LOW (ref >60)
GFR calc non Af Amer: 33 mL/min — ABNORMAL LOW (ref >60)
GFR, EST AFRICAN AMERICAN: 38 mL/min — AB (ref >60)
GFR, EST NON AFRICAN AMERICAN: 37 mL/min — AB (ref >60)
GLUCOSE: 249 mg/dL — AB (ref 65–99)
GLUCOSE: 182 mg/dL — AB (ref 65–99)
Potassium: 4.4 mmol/L (ref 3.5–5.1)
Potassium: 4.1 mmol/L (ref 3.5–5.1)
Sodium: 141 mmol/L (ref 135–145)
Sodium: 142 mmol/L (ref 135–145)

## 2016-12-22 LAB — CBC
HCT: 27.4 % — ABNORMAL LOW (ref 40.0–52.0)
Hemoglobin: 9 g/dL — ABNORMAL LOW (ref 13.0–18.0)
MCH: 27.7 pg (ref 26.0–34.0)
MCHC: 32.9 g/dL (ref 32.0–36.0)
MCV: 84.1 fL (ref 80.0–100.0)
PLATELETS: 539 10*3/uL — AB (ref 150–440)
RBC: 3.25 MIL/uL — ABNORMAL LOW (ref 4.40–5.90)
RDW: 17.4 % — AB (ref 11.5–14.5)
WBC: 19.6 10*3/uL — AB (ref 3.8–10.6)

## 2016-12-22 LAB — GLUCOSE, CAPILLARY
GLUCOSE-CAPILLARY: 159 mg/dL — AB (ref 65–99)
GLUCOSE-CAPILLARY: 173 mg/dL — AB (ref 65–99)
GLUCOSE-CAPILLARY: 184 mg/dL — AB (ref 65–99)
GLUCOSE-CAPILLARY: 197 mg/dL — AB (ref 65–99)
GLUCOSE-CAPILLARY: 202 mg/dL — AB (ref 65–99)
GLUCOSE-CAPILLARY: 203 mg/dL — AB (ref 65–99)
GLUCOSE-CAPILLARY: 207 mg/dL — AB (ref 65–99)
GLUCOSE-CAPILLARY: 214 mg/dL — AB (ref 65–99)
Glucose-Capillary: 155 mg/dL — ABNORMAL HIGH (ref 65–99)
Glucose-Capillary: 168 mg/dL — ABNORMAL HIGH (ref 65–99)
Glucose-Capillary: 178 mg/dL — ABNORMAL HIGH (ref 65–99)
Glucose-Capillary: 187 mg/dL — ABNORMAL HIGH (ref 65–99)
Glucose-Capillary: 198 mg/dL — ABNORMAL HIGH (ref 65–99)
Glucose-Capillary: 201 mg/dL — ABNORMAL HIGH (ref 65–99)
Glucose-Capillary: 203 mg/dL — ABNORMAL HIGH (ref 65–99)
Glucose-Capillary: 204 mg/dL — ABNORMAL HIGH (ref 65–99)
Glucose-Capillary: 206 mg/dL — ABNORMAL HIGH (ref 65–99)
Glucose-Capillary: 209 mg/dL — ABNORMAL HIGH (ref 65–99)
Glucose-Capillary: 212 mg/dL — ABNORMAL HIGH (ref 65–99)
Glucose-Capillary: 214 mg/dL — ABNORMAL HIGH (ref 65–99)
Glucose-Capillary: 218 mg/dL — ABNORMAL HIGH (ref 65–99)
Glucose-Capillary: 227 mg/dL — ABNORMAL HIGH (ref 65–99)
Glucose-Capillary: 227 mg/dL — ABNORMAL HIGH (ref 65–99)

## 2016-12-22 LAB — MAGNESIUM: Magnesium: 2.5 mg/dL — ABNORMAL HIGH (ref 1.7–2.4)

## 2016-12-22 LAB — PHOSPHORUS: Phosphorus: 3.7 mg/dL (ref 2.5–4.6)

## 2016-12-22 LAB — PROCALCITONIN: Procalcitonin: 0.35 ng/mL

## 2016-12-22 LAB — TRIGLYCERIDES: Triglycerides: 310 mg/dL — ABNORMAL HIGH (ref <150)

## 2016-12-22 MED ORDER — DEXTROSE 5 % IV SOLN
2.0000 g | Freq: Two times a day (BID) | INTRAVENOUS | Status: DC
  Administered 2016-12-23 – 2016-12-25 (×7): 2 g via INTRAVENOUS
  Filled 2016-12-22 (×8): qty 2

## 2016-12-22 MED ORDER — ROCURONIUM BROMIDE 50 MG/5ML IV SOLN
0.5000 mg/kg | Freq: Once | INTRAVENOUS | Status: AC
  Administered 2016-12-22: 65.8 mg via INTRAVENOUS
  Filled 2016-12-22: qty 2

## 2016-12-22 MED ORDER — AMIODARONE HCL IN DEXTROSE 360-4.14 MG/200ML-% IV SOLN
60.0000 mg/h | INTRAVENOUS | Status: AC
  Administered 2016-12-22 – 2016-12-24 (×10): 60 mg/h via INTRAVENOUS
  Filled 2016-12-22 (×10): qty 200

## 2016-12-22 MED ORDER — BISACODYL 10 MG RE SUPP
10.0000 mg | Freq: Once | RECTAL | Status: AC
  Administered 2016-12-22: 10 mg via RECTAL
  Filled 2016-12-22: qty 1

## 2016-12-22 MED ORDER — METHYLPREDNISOLONE SODIUM SUCC 125 MG IJ SOLR
60.0000 mg | Freq: Every day | INTRAMUSCULAR | Status: DC
  Administered 2016-12-23 – 2016-12-24 (×2): 60 mg via INTRAVENOUS
  Filled 2016-12-22 (×2): qty 2

## 2016-12-22 MED ORDER — FUROSEMIDE 10 MG/ML IJ SOLN
20.0000 mg | Freq: Every day | INTRAMUSCULAR | Status: DC
  Administered 2016-12-22 – 2016-12-23 (×2): 20 mg via INTRAVENOUS
  Filled 2016-12-22 (×2): qty 2

## 2016-12-22 MED ORDER — HYDRALAZINE HCL 20 MG/ML IJ SOLN
10.0000 mg | INTRAMUSCULAR | Status: DC | PRN
  Filled 2016-12-22: qty 1

## 2016-12-22 NOTE — Progress Notes (Signed)
Nutrition Follow-up  DOCUMENTATION CODES:   Obesity unspecified  INTERVENTION:  Continue Vital High Protein at 40 ml/hr + Pro-Stat 60 ml TID via OGT. Provides 1560 kcal, 174 grams of protein, 806 ml H2O daily. With current propofol rate provides 1872 kcal.   Would recommend PEPuP protocol (was never entered on order) and liquid MVI per tube daily. However, as plan is for possible one way extubation, will not make any changes at this time.  Recommend ordering daily weights to help monitor volume status.  NUTRITION DIAGNOSIS:   Inadequate oral intake related to inability to eat as evidenced by NPO status.  Ongoing - addressing with TF.  GOAL:   Provide needs based on ASPEN/SCCM guidelines  Not met over past 24 hrs - addressing with tube feeds.  MONITOR:   Vent status, Labs, Weight trends, TF tolerance, I & O's  REASON FOR ASSESSMENT:   Ventilator    ASSESSMENT:   65 year old male with PMHx of DM type 2, hypothyroidism, anxiety, bipolar disorder, borderline personality disorder, depression, HTN, gout, PVD, GERD, BPH, CAD, CHF, OSA who presented with increased SOB found to have severe respiratory distress, pulmonary edema, acidotic on ABG so patient was intubated after failing BiPAP. Patient also volume overloaded (BNP 2377 on 9/1).  -PMT has been following patient and meeting with family. Guarded prognosis.  Access: OGT placed 9/1; verified to terminate in gastric antrum per abdominal x-ray 9/1  TF: patient tolerating Vital High Protein at 40 ml/hr + Pro-Stat 60 ml TID; in past 24 hrs patient has received 509 ml of Vital High Protein and 6 packets of Pro-Stat (1109 kcal, 135 grams of protein).  Patient is currently intubated on ventilator support MV: 6.7 L/min Temp (24hrs), Avg:99.4 F (37.4 C), Min:97.7 F (36.5 C), Max:101.5 F (38.6 C)  Propofol: 11.8 ml/hr (312 kcal daily); was previously 23.7 ml/hr (626 kcal daily) earlier this AM  Medications reviewed and  include: Dulcolax, Colace, Lasix 20 mg daily, levothyroxine, methylprednisolone 60 mg Q6hrs, pantoprazole, Senokot, fentanyl gtt, regular insulin gtt, propofol gtt, valproate.  Labs reviewed: CBG 159-227, Triglycerides 310, Chloride 98, BUN 62, Creatinine 2.04.  I/O: 2270 ml UOP past 24 hrs (0.7 ml/kg/hr - inadequate)  No subsequent weights from admission to trend.  Patient discussed on rounds. Patient has failed spontaneous weaning trial multiple times. Patient now DNR. Plan is for discussion of possible one way extubation.  Diet Order:  Diet NPO time specified Except for: Sips with Meds  Skin:  Reviewed, no issues  Last BM:  12/20/2016 - small type 5  Height:   Ht Readings from Last 1 Encounters:  12/31/2016 _0  (1.88 m)    Weight:   Wt Readings from Last 1 Encounters:  01/04/2017 290 lb (131.5 kg)    Ideal Body Weight:  86.4 kg  BMI:  Body mass index is 37.23 kg/m.  Estimated Nutritional Needs:   Kcal:  1379-1756 (11-14 kcal/kg)  Protein:  >/= 173 grams (>/= 2 grams/kg IBW)  Fluid:  per MD in setting of volume overload  EDUCATION NEEDS:   No education needs identified at this time  Willey Blade, MS, RD, LDN Pager: [phone] After Hours Pager: [phone]

## 2016-12-22 NOTE — Progress Notes (Signed)
[place] at [place] NAME: Dennis Mullen    MR#:  [id number]  DATE OF BIRTH:  12-01-1950  SUBJECTIVE:  CHIEF COMPLAINT:   Chief Complaint  Patient presents with  . Respiratory Distress     Came with respi distress and intubated, have CHF, renal failure, acidosis.    Remains on vent. Have good diuresis, renal func better.   Failed weaning trials.   After discussion with palliative care and ICU team, family agreed on DNR.  REVIEW OF SYSTEMS:  Intubated, so can not give much details, sedated. ROS  DRUG ALLERGIES:  No Known Allergies  VITALS:  Blood pressure (!) 118/49, pulse 69, temperature 98.6 F (37 C), resp. rate 14, height 6\' 2"  (1.88 m), weight 131.5 kg (290 lb), SpO2 100 %.  PHYSICAL EXAMINATION:    GENERAL:  66 y.o.-year-old patient lying in the bed with acute distress, critical appearing. EYES: Pupils equal, round, reactive to light. No scleral icterus. Extraocular muscles did not check, pt is sedated.  HEENT: Head atraumatic, normocephalic. Oropharynx and nasopharynx clear.  NECK:  Supple, no jugular venous distention. No thyroid enlargement, no tenderness.  LUNGS: Normal breath sounds bilaterally, no wheezing, b/l crepitation. No use of accessory muscles of respiration. On vent support via ET tube. CARDIOVASCULAR: S1, S2 normal. No murmurs, rubs, or gallops.  ABDOMEN: Soft, nontender, nondistended. Bowel sounds present. No organomegaly or mass.  EXTREMITIES: No pedal edema, cyanosis, or clubbing.  NEUROLOGIC: pt is on vent support, sedated. PSYCHIATRIC: sedated on vent.  SKIN: No obvious rash, lesion, or ulcer.   Physical Exam LABORATORY PANEL:   CBC  Recent Labs Lab 12/17/16 0305  WBC 12.2*  HGB 9.7*  HCT 30.1*  PLT 375   ------------------------------------------------------------------------------------------------------------------  Chemistries   Recent Labs Lab 12/29/2016 0723  12/18/16 0446   12/22/16 0502  NA 131*  < >  --   < > 141  K 4.4  < >  --   < > 4.4  CL 96*  < >  --   < > 98*  CO2 23  < >  --   < > 31  GLUCOSE 523*  < >  --   < > 249*  BUN 47*  < >  --   < > 62*  CREATININE 2.13*  < >  --   < > 2.04*  CALCIUM 9.2  < >  --   < > 9.1  MG  --   < > 2.1  --   --   AST 28  --   --   --   --   ALT 16*  --   --   --   --   ALKPHOS 87  --   --   --   --   BILITOT 0.8  --   --   --   --   < > = values in this interval not displayed. ------------------------------------------------------------------------------------------------------------------  Cardiac Enzymes  Recent Labs Lab 12/28/2016 2116 12/17/16 0305  TROPONINI 1.25* 1.35*   ------------------------------------------------------------------------------------------------------------------  RADIOLOGY:  Dg Chest 1 View  Result Date: 12/21/2016 CLINICAL DATA:  Intubation. EXAM: CHEST 1 VIEW COMPARISON:  December 19, 2016 FINDINGS: The ETT is in good position. The NG tube is not well seen distally but terminates below the diaphragm. Significant improvement in bibasilar opacities. No other interval changes or acute abnormalities. IMPRESSION: 1. Improving opacities in the bases.  Support apparatus as above. Electronically Signed   By: Shanon Brow  Mee Hives M.D   On: 12/21/2016 21:19   Dg Chest Port 1 View  Result Date: 12/22/2016 CLINICAL DATA:  Dyspnea. History of diabetes, gastroesophageal reflux disease, CHF, hypertension. Nonsmoker. EXAM: PORTABLE CHEST 1 VIEW COMPARISON:  12/21/2016 FINDINGS: Endotracheal tube with tip measuring 3.9 cm above the carina. Enteric tube tip is off the field of view but below the left hemidiaphragm. Right central venous catheter tip is over the upper SVC region. No pneumothorax. Cardiac enlargement. Atelectasis in the lung bases. No blunting of costophrenic angles. No pneumothorax. IMPRESSION: Appliances appear in satisfactory position. Cardiac enlargement. Atelectasis in the lung bases. No  significant change since prior study. Electronically Signed   By: Lucienne Capers M.D.   On: 12/22/2016 04:40    ASSESSMENT AND PLAN:   Active Problems:   Acute respiratory failure with hypoxia (HCC)   Acute on chronic systolic CHF (congestive heart failure) (HCC)   Acute respiratory failure (HCC)   Acute renal failure superimposed on stage 3 chronic kidney disease (HCC)   Goals of care, counseling/discussion   Palliative care encounter   Ventilator dependence ([place])  * Ac respi failure hypoxic   Ac on ch systolic CHF   HCA pneumonia- due to repeated admissions- suspected initially,, but ruled out , intencivist stopped Abx.   S/p intubation   Intencivist and cardio consult.   Difficult extubation and poor prognosis.    IV lasix, I/o monitor, renal func monitor,.have good diuresis.   Appreciated ICU physician help,    vanc + zosyn, awaited cx, procalcitonin is high.- stopped Abx.   Now held lasix.    Very high risk for recurrent symptoms and reintubation, failed trials, poor prognosis- called pallaitive care, I also spoke to his son in room.  * Uncontrolled DM    Removed his Insuline delivery device    on sliding scale coverage now.   No DKA currently.   Hold oral meds    Now more controlled insulin drip.  * lactic acidosis    Likely due to CHF    Cont to monitor with vent support.   Improved.  * Uncontrolled Htn   BP was 403 systolic, ER started on nitro drip    Had drop in BP, so stopped. Now stable.  * hypothyroidism     Cont TSH.  * Elevated troponin   Likely coming down from NSTEMI last week.   Monitor every 4 hours.    Now rising some, appreciated cardio help, no further intervention.  * Ac renal failure on CKD stage 3   Monitor closely with lasix use.   Appreciated help by nephrology.    Slightly improved, resumed lasix now.   * CAD   Cont asa, plavix, metoprolol, rosuvastatin  *  A fib   COnt metoprolol and amiodarone.   Eliquis changed to  lovenox [place] for now.    On amio drip now.   All the records are reviewed and case discussed with Care Management/Social Workerr. Management plans discussed with the patient, family and they are in agreement.  CODE STATUS: Full.  TOTAL TIME TAKING CARE OF THIS PATIENT: 35 minutes.   Prognosis very poor, palliative care involved.  POSSIBLE D/C IN 1-2 DAYS, DEPENDING ON CLINICAL CONDITION.   Vaughan Basta M.D on 12/22/2016   Between 7am to 6pm - Pager - [phone]  After 6pm go to [email or web address] - password [email or web address] [facility]  Office  [phone]  CC: Primary care physician; Lavera Guise, MD  Note: This  dictation was prepared with Dragon dictation along with smaller phrase technology. Any transcriptional errors that result from this process are unintentional.

## 2016-12-22 NOTE — Progress Notes (Signed)
Received in day shift report from off going RN, that ICU MD attending for the day wanted to continue the patient on Amiodarone gtt at 60mg /hr currently. Called night shift ICU NP and verbalized to her that patients' Amiodarone gtt expires at [place]. Received verbal order to modify and extend Amiodarone gtt order. Patient vital signs stable, cardiac rhythm still in Afib, rate in 100's-120's.

## 2016-12-22 NOTE — Progress Notes (Signed)
Was called to pt's room by RN for issue with Pt's min ventilation being low.  The Pt was noted to be more awake than before and becoming increasingly agitated.  RN received order from NP to give sedation and a paralytic to help the Pt become more in sync with the vent.  Shortly after meds had been administered  The Pt's min ventilation began to decrease again to below 1 the Pt also began to desat to 79 and was taken off vent and bagged by RN with BVM until o2 sats returned to 100%.  Pt was placed back on vent in PRVC mode with peak pressures still in the mid to upper 50s but with a min ventilation of 6.  MD was called using Elink and the pt's condition was explained to Physician.  MD ordered a stat chest xray and requested to be called back once paralytic wears off.  Pt's current vent settings ar PRVC, rr 30, vt 500, peep +5, 60%.

## 2016-12-22 NOTE — Progress Notes (Signed)
Propofol weaned off today, and fentanyl decreased to 50 mcg. Patient tolerated well for about 2 hours, however, became agitated with elevated HR, propofol restarted. Wilnette Kales

## 2016-12-22 NOTE — Consult Note (Signed)
[facility] NOTE  Pharmacy Consult for electrolytes/constipation prevention    No Known Allergies  Patient Measurements: Height: _0  (188 cm) Weight: 290 lb (131.5 kg) IBW/kg (Calculated) : 82.2  Vital Signs: Temp: 98.4 F (36.9 C) (09/07 1000) Temp Source: Core (Comment) (09/07 0400) BP: 154/65 (09/07 1000) Pulse Rate: 79 (09/07 1000) Intake/Output from previous day: 09/06 0701 - 09/07 0700 In: 2106 [I.V.:947.7; NG/GT:1108.3; IV Piggyback:50] Out: 2445 [Urine:2270; Emesis/NG output:175] Intake/Output from this shift: Total I/O In: 173.9 [I.V.:118.9; IV Piggyback:55] Out: -  Vent settings for last 24 hours: Vent Mode: PRVC FiO2 (%):  [30 %-100 %] 45 % Set Rate:  [4 bmp-30 bmp] 15 bmp Vt Set:  [500 mL] 500 mL PEEP:  [5 cmH20-8 cmH20] 5 cmH20 Pressure Support:  [40 cmH20] 40 cmH20 Plateau Pressure:  [24 cmH20] 24 cmH20  Labs:  Recent Labs  12/20/16 0417 12/20/16 0431 12/21/16 0345 12/22/16 0502  CREATININE 1.44*  --  1.32* 2.04*  LABCREA  --  136  --   --   PHOS 3.8  --   --   --   ALBUMIN 2.5*  --   --   --    Estimated Creatinine Clearance: 52.7 mL/min (A) (by C-G formula based on SCr of 2.04 mg/dL (H)).   Recent Labs  12/22/16 0745 12/22/16 0904 12/22/16 1006  GLUCAP 227* 212* 218*     Medications:  Scheduled:  . chlorhexidine gluconate (MEDLINE KIT)  15 mL Mouth Rinse BID  . clopidogrel  75 mg Per NG tube Daily  . docusate  200 mg Per Tube BID  . enoxaparin (LOVENOX) injection  1 mg/kg Subcutaneous Q12H  . feeding supplement (PRO-STAT SUGAR FREE 64)  60 mL Per Tube TID  . fentaNYL (SUBLIMAZE) injection  50 mcg Intravenous Once  . furosemide  20 mg Intravenous Daily  . ipratropium-albuterol  3 mL Nebulization Q4H  . levothyroxine  50 mcg Per NG tube QAC breakfast  . mouth rinse  15 mL Mouth Rinse 10 times per day  . methylPREDNISolone (SOLU-MEDROL) injection  60 mg Intravenous Q6H  . metoprolol tartrate  25 mg Per NG  tube BID  . pantoprazole sodium  40 mg Per Tube Daily  . rosuvastatin  20 mg Per Tube QHS  . sennosides  10 mL Per Tube BID    Assessment: Pharmacy consulted for electrolyte and constipation management for 66 yo male ICU patient admitted with respiratory distress. Patient is currently sedated on fentanyl and propofol. Patient was given 40 mg IV furosemide x 1 dose.   Plan:  1. Electrolytes: no replacement warranted at this time. Will obtain follow up electrolytes with am labs.   2. Constipation: continue docusate 27m VT BID and senna 8.842mVT BID. Give bisacodyl 10 mg suppository x 1 dose due to no bowel movement in 2 days.  Pharmacy will continue to monitor and adjust per consult.   MeDurwin RegesPharmD Student  12/21/2016  [phone]

## 2016-12-22 NOTE — Progress Notes (Signed)
[facility]  ROUNDING NOTE   Subjective:   Intubated, sedated.   Family meeting with palliative care.   Furosemide 49m IV given yesterday. UOP 2106  Creatinine 2.04 (1.32)   Objective:  Vital signs in last 24 hours:  Temp:  [97.7 F (36.5 C)-101.5 F (38.6 C)] 98.6 F (37 C) (09/07 0830) Pulse Rate:  [45-106] 69 (09/07 0830) Resp:  [9-30] 14 (09/07 0830) BP: (87-208)/(48-90) 118/49 (09/07 0830) SpO2:  [88 %-100 %] 100 % (09/07 0830) FiO2 (%):  [30 %-100 %] 60 % (09/07 0754)  Weight change:  Filed Weights   12/20/2016 0721  Weight: 131.5 kg (290 lb)    Intake/Output: I/O last 3 completed shifts: In: 3427.9 [I.V.:1736.9; NG/GT:1591; IV Piggyback:100] Out: 3250 [Urine:3075; Emesis/NG output:175]   Intake/Output this shift:  Total I/O In: 173.9 [I.V.:118.9; IV Piggyback:55] Out: -   Physical Exam: General: Critically ill appearing   Head: +ETT, +OGT  Eyes: Eyes closed  Neck: RIJ central line, obese  Lungs:  Crackles bilaterally, PRVC FIO2 60%  Heart: irregular   Abdomen:  Soft, nontender, obese  Extremities: no peripheral or dependent edema. Left lower extremity cyanosis  Neurologic: Intubated, sedated  Skin: No lesions       Basic Metabolic Panel:  Recent Labs Lab 12/23/2016 1447  12/18/16 0421 12/18/16 0446 12/19/16 0522 12/20/16 0417 12/21/16 0345 12/22/16 0502  NA 135  < > 138  --  136 140 140 141  K 4.6  < > 3.9  --  4.1 4.1 4.2 4.4  CL 100*  < > 98*  --  99* 100* 100* 98*  CO2 25  < > 30  --  30 31 31 31   GLUCOSE 199*  < > 131*  --  209* 219* 156* 249*  BUN 51*  < > 38*  --  34* 41* 51* 62*  CREATININE 2.16*  < > 2.06*  --  1.54* 1.44* 1.32* 2.04*  CALCIUM 8.5*  < > 9.0  --  8.6* 9.0 8.9 9.1  MG 2.1  --   --  2.1  --   --   --   --   PHOS 5.9*  --   --  4.5  --  3.8  --   --   < > = values in this interval not displayed.  Liver Function Tests:  Recent Labs Lab 12/18/2016 0723 12/20/16 0417  AST 28  --   ALT 16*  --    ALKPHOS 87  --   BILITOT 0.8  --   PROT 8.2*  --   ALBUMIN 3.4* 2.5*   No results for input(s): LIPASE, AMYLASE in the last 168 hours. No results for input(s): AMMONIA in the last 168 hours.  CBC:  Recent Labs Lab 01/14/2017 0723 12/17/16 0305  WBC 19.3* 12.2*  HGB 10.4* 9.7*  HCT 33.1* 30.1*  MCV 91.5 86.2  PLT 560* 375    Cardiac Enzymes:  Recent Labs Lab 12/19/2016 0903 01/06/2017 1256 01/03/2017 1637 12/22/2016 2116 12/17/16 0305  TROPONINI 0.46* 1.01* 1.20* 1.25* 1.35*    BNP: Invalid input(s): POCBNP  CBG:  Recent Labs Lab 12/22/16 0410 12/22/16 0517 12/22/16 0558 12/22/16 0745 12/22/16 0904  GLUCAP 198* 203* 214* 227* 212*    Microbiology: Results for orders placed or performed during the hospital encounter of 12/21/2016  Blood culture (routine x 2)     Status: None   Collection Time: 12/20/2016  7:24 AM  Result Value Ref Range Status   Specimen  Description BLOOD RIGHT ANTECUBITAL  Final   Special Requests   Final    BOTTLES DRAWN AEROBIC AND ANAEROBIC Blood Culture adequate volume   Culture NO GROWTH 5 DAYS  Final   Report Status 12/21/2016 FINAL  Final  Blood culture (routine x 2)     Status: None   Collection Time: 12/27/2016  7:29 AM  Result Value Ref Range Status   Specimen Description BLOOD LEFT ANTECUBITAL  Final   Special Requests   Final    BOTTLES DRAWN AEROBIC AND ANAEROBIC Blood Culture adequate volume   Culture NO GROWTH 5 DAYS  Final   Report Status 12/21/2016 FINAL  Final  Culture, respiratory (NON-Expectorated)     Status: None   Collection Time: 12/18/16 10:00 AM  Result Value Ref Range Status   Specimen Description TRACHEAL ASPIRATE  Final   Special Requests NONE  Final   Gram Stain   Final    ABUNDANT WBC PRESENT,BOTH PMN AND MONONUCLEAR NO ORGANISMS SEEN    Culture   Final    NO GROWTH 2 DAYS Performed at [facility], [place] [address] [place], [place] [address]    Report Status 12/21/2016 FINAL  Final    Coagulation  Studies: No results for input(s): LABPROT, INR in the last 72 hours.  Urinalysis:  Recent Labs  12/20/16 0431  COLORURINE YELLOW*  LABSPEC 1.023  PHURINE 5.0  GLUCOSEU 50*  HGBUR NEGATIVE  BILIRUBINUR NEGATIVE  KETONESUR NEGATIVE  PROTEINUR NEGATIVE  NITRITE NEGATIVE  LEUKOCYTESUR NEGATIVE      Imaging: Dg Chest 1 View  Result Date: 12/21/2016 CLINICAL DATA:  Intubation. EXAM: CHEST 1 VIEW COMPARISON:  December 19, 2016 FINDINGS: The ETT is in good position. The NG tube is not well seen distally but terminates below the diaphragm. Significant improvement in bibasilar opacities. No other interval changes or acute abnormalities. IMPRESSION: 1. Improving opacities in the bases.  Support apparatus as above. Electronically Signed   By: Dorise Bullion III M.D   On: 12/21/2016 21:19   Dg Chest Port 1 View  Result Date: 12/22/2016 CLINICAL DATA:  Dyspnea. History of diabetes, gastroesophageal reflux disease, CHF, hypertension. Nonsmoker. EXAM: PORTABLE CHEST 1 VIEW COMPARISON:  12/21/2016 FINDINGS: Endotracheal tube with tip measuring 3.9 cm above the carina. Enteric tube tip is off the field of view but below the left hemidiaphragm. Right central venous catheter tip is over the upper SVC region. No pneumothorax. Cardiac enlargement. Atelectasis in the lung bases. No blunting of costophrenic angles. No pneumothorax. IMPRESSION: Appliances appear in satisfactory position. Cardiac enlargement. Atelectasis in the lung bases. No significant change since prior study. Electronically Signed   By: Lucienne Capers M.D.   On: 12/22/2016 04:40     Medications:   . amiodarone Stopped (12/21/16 2035)  . feeding supplement (VITAL HIGH PROTEIN) Stopped (12/22/16 0406)  . fentaNYL infusion INTRAVENOUS 50 mcg/hr (12/22/16 0855)  . insulin (NOVOLIN-R) infusion 15.5 Units/hr (12/22/16 0920)  . propofol (DIPRIVAN) infusion 15 mcg/kg/min (12/22/16 0855)  . valproate sodium Stopped (12/21/16 2324)   .  chlorhexidine gluconate (MEDLINE KIT)  15 mL Mouth Rinse BID  . clopidogrel  75 mg Per NG tube Daily  . docusate  200 mg Per Tube BID  . enoxaparin (LOVENOX) injection  1 mg/kg Subcutaneous Q12H  . feeding supplement (PRO-STAT SUGAR FREE 64)  60 mL Per Tube TID  . fentaNYL (SUBLIMAZE) injection  50 mcg Intravenous Once  . ipratropium-albuterol  3 mL Nebulization Q4H  . levothyroxine  50  mcg Per NG tube QAC breakfast  . mouth rinse  15 mL Mouth Rinse 10 times per day  . methylPREDNISolone (SOLU-MEDROL) injection  60 mg Intravenous Q6H  . metoprolol tartrate  25 mg Per NG tube BID  . pantoprazole sodium  40 mg Per Tube Daily  . rosuvastatin  20 mg Per Tube QHS  . sennosides  10 mL Per Tube BID   acetaminophen, bisacodyl, docusate sodium, fentaNYL, hydrALAZINE, ipratropium-albuterol  Assessment/ Plan:  66 y.o. white male with anxiety, bipolar disorder, BPH, coronary artery disease, depression, diabetes mellitus type 2 insulin dependent, erectile dysfunction, GERD, gout, hypertension, hypothyroidism, peripheral neuropathy, obesity, chronic kidney disease stage III, peripheral vascular disease who was admitted to [facility] on 01/07/2017 for evaluation of chest pain. Upon evaluation the patient was found to be in atrial fibrillation versus flutter.   1. Acute renal failure on chronic kidney disease stage III Baseline creatinine 1-1.3 Acute renal failure secondary to acute respiratory failure, atrial fibrillation with RVR and aggressive diuretics. Chronic kidney disease secondary to diabetes, hypertension and vascular disease No recent urinalysis.  Not on ACE-I/ARB as outpatient.  - Restart furosemide 61m IV daily and monitor volume status and renal function.  No acute indication for dialysis at this time.   2. Acute respiratory failure and acute on chronic systolic heart failure. On mechanical ventilation. Echocardiogram reviewed. Moderately reduced systolic function - Appreciate pulmonary  input - IV solumedrol  3. Anemia of chronic kidney disease. Hemoglobin 9.7. No EPO administered.   4. Diabetes mellitus type II with chronic kidney disease: insulin dependent.  - holding metformin   LOS: 6 Denver Bentson 9/7/20189:39 AM

## 2016-12-22 NOTE — Progress Notes (Addendum)
Pharmacy Antibiotic Note  Dennis Mullen is a 66 y.o. male admitted on 12/25/2016 with sepsis.  Pharmacy has been consulted for cefepime dosing.  Plan: Cefepime 2 grams q 12 hours ordered.  Height: 6\' 2"  (188 cm) Weight: 290 lb (131.5 kg) IBW/kg (Calculated) : 82.2  Temp (24hrs), Avg:99.1 F (37.3 C), Min:97.7 F (36.5 C), Max:100.9 F (38.3 C)   Recent Labs Lab 01/03/2017 0723 12/17/2016 0727 12/20/2016 1030 01/02/2017 1256  12/21/2016 1637  12/17/16 0305 12/17/16 1600 12/18/16 0421 12/19/16 0522 12/20/16 0417 12/21/16 0345 12/22/16 0502  WBC 19.3*  --   --   --   --   --   --  12.2*  --   --   --   --   --   --   CREATININE 2.13*  --   --   --   < >  --   < > 1.92*  --  2.06* 1.54* 1.44* 1.32* 2.04*  LATICACIDVEN  --  6.8* 2.7* 2.4*  --  1.4  --   --   --   --   --   --   --   --   VANCORANDOM  --   --   --   --   --   --   --   --  10  --   --   --   --   --   < > = values in this interval not displayed.  Estimated Creatinine Clearance: 52.7 mL/min (A) (by C-G formula based on SCr of 2.04 mg/dL (H)).    No Known Allergies  Antimicrobials this admission: 9/1 vanc, Zosyn>>9/3; cefepime 9/7  >>    >>   Dose adjustments this admission:   Microbiology results: 9/3 BCx: NG 5 days 9/7 UCx: pending  9/7 Sputum: pending  8/23 MRSA PCR: (-)      9/5 UA: (-) 9/6 CXR: improving opacities Thank you for allowing pharmacy to be a part of this patient's care.  Luberta Grabinski S 12/22/2016 10:18 PM

## 2016-12-22 NOTE — Progress Notes (Signed)
[facility] [place] NAME: Will Heinkel    MR#:  [id number]  DATE OF BIRTH:  01/09/1951  SUBJECTIVE:  CHIEF COMPLAINT:   Chief Complaint  Patient presents with  . Respiratory Distress     Came with respi distress and intubated, have CHF, renal failure, acidosis.    Remains on vent. Have good diuresis, renal func better.   Failed weaning trials.   After discussion with palliative care and ICU team, family agreed on DNR. Want to wait for 1-2 days to check for response.  REVIEW OF SYSTEMS:  Intubated, so can not give much details, sedated. ROS  DRUG ALLERGIES:  No Known Allergies  VITALS:  Blood pressure 126/63, pulse (!) 110, temperature (!) 100.6 F (38.1 C), resp. rate 14, height 6\' 2"  (1.88 m), weight 131.5 kg (290 lb), SpO2 100 %.  PHYSICAL EXAMINATION:    GENERAL:  66 y.o.-year-old patient lying in the bed with acute distress, critical appearing. EYES: Pupils equal, round, reactive to light. No scleral icterus. Extraocular muscles did not check, pt is sedated.  HEENT: Head atraumatic, normocephalic. Oropharynx and nasopharynx clear.  NECK:  Supple, no jugular venous distention. No thyroid enlargement, no tenderness.  LUNGS: Normal breath sounds bilaterally, no wheezing, b/l crepitation. No use of accessory muscles of respiration. On vent support via ET tube. CARDIOVASCULAR: S1, S2 normal. No murmurs, rubs, or gallops.  ABDOMEN: Soft, nontender, nondistended. Bowel sounds present. No organomegaly or mass.  EXTREMITIES: No pedal edema, cyanosis, or clubbing.  NEUROLOGIC: pt is on vent support, sedated. PSYCHIATRIC: sedated on vent.  SKIN: No obvious rash, lesion, or ulcer.   Physical Exam LABORATORY PANEL:   CBC  Recent Labs Lab 12/17/16 0305  WBC 12.2*  HGB 9.7*  HCT 30.1*  PLT 375   ------------------------------------------------------------------------------------------------------------------  Chemistries    Recent Labs Lab 12/29/2016 0723  12/18/16 0446  12/22/16 0502  NA 131*  < >  --   < > 141  K 4.4  < >  --   < > 4.4  CL 96*  < >  --   < > 98*  CO2 23  < >  --   < > 31  GLUCOSE 523*  < >  --   < > 249*  BUN 47*  < >  --   < > 62*  CREATININE 2.13*  < >  --   < > 2.04*  CALCIUM 9.2  < >  --   < > 9.1  MG  --   < > 2.1  --   --   AST 28  --   --   --   --   ALT 16*  --   --   --   --   ALKPHOS 87  --   --   --   --   BILITOT 0.8  --   --   --   --   < > = values in this interval not displayed. ------------------------------------------------------------------------------------------------------------------  Cardiac Enzymes  Recent Labs Lab 01/08/2017 2116 12/17/16 0305  TROPONINI 1.25* 1.35*   ------------------------------------------------------------------------------------------------------------------  RADIOLOGY:  Dg Chest 1 View  Result Date: 12/21/2016 CLINICAL DATA:  Intubation. EXAM: CHEST 1 VIEW COMPARISON:  December 19, 2016 FINDINGS: The ETT is in good position. The NG tube is not well seen distally but terminates below the diaphragm. Significant improvement in bibasilar opacities. No other interval changes or acute abnormalities. IMPRESSION: 1. Improving opacities in the bases.  Support apparatus as above. Electronically Signed   By: Dorise Bullion III M.D   On: 12/21/2016 21:19   Dg Chest Port 1 View  Result Date: 12/22/2016 CLINICAL DATA:  Dyspnea. History of diabetes, gastroesophageal reflux disease, CHF, hypertension. Nonsmoker. EXAM: PORTABLE CHEST 1 VIEW COMPARISON:  12/21/2016 FINDINGS: Endotracheal tube with tip measuring 3.9 cm above the carina. Enteric tube tip is off the field of view but below the left hemidiaphragm. Right central venous catheter tip is over the upper SVC region. No pneumothorax. Cardiac enlargement. Atelectasis in the lung bases. No blunting of costophrenic angles. No pneumothorax. IMPRESSION: Appliances appear in satisfactory position.  Cardiac enlargement. Atelectasis in the lung bases. No significant change since prior study. Electronically Signed   By: Lucienne Capers M.D.   On: 12/22/2016 04:40    ASSESSMENT AND PLAN:   Active Problems:   Acute respiratory failure with hypoxia (HCC)   Acute on chronic systolic CHF (congestive heart failure) (HCC)   Acute respiratory failure (HCC)   Acute renal failure superimposed on stage 3 chronic kidney disease (HCC)   Goals of care, counseling/discussion   Palliative care encounter   Ventilator dependence ([place])  * Ac respi failure hypoxic   Ac on ch systolic CHF   HCA pneumonia- due to repeated admissions- suspected initially,, but ruled out , intencivist stopped Abx.   S/p intubation   Intencivist and cardio consult.   Difficult extubation and poor prognosis.    IV lasix, I/o monitor, renal func monitor,.have good diuresis.   Appreciated ICU physician help,    vanc + zosyn, awaited cx, procalcitonin is high.- stopped Abx.   Now held lasix.    Very high risk for recurrent symptoms and reintubation, failed trials, poor prognosis- called pallaitive care, I also spoke to his son in room.  * Uncontrolled DM    Removed his Insuline delivery device    on sliding scale coverage now.   No DKA currently.   Hold oral meds    Now more controlled insulin drip.  * lactic acidosis    Likely due to CHF    Cont to monitor with vent support.   Improved.  * Uncontrolled Htn   BP was 465 systolic, ER started on nitro drip    Had drop in BP, so stopped. Now stable.  * hypothyroidism     Cont TSH.  * Elevated troponin   Likely coming down from NSTEMI last week.   Monitor every 4 hours.    Now rising some, appreciated cardio help, no further intervention.  * Ac renal failure on CKD stage 3   Monitor closely with lasix use.   Appreciated help by nephrology.    Slightly improved, resumed lasix now.   * CAD   Cont asa, plavix, metoprolol, rosuvastatin  *  A fib    COnt metoprolol and amiodarone.   Eliquis changed to lovenox Monon for now.    On amio drip now.   All the records are reviewed and case discussed with Care Management/Social Workerr. Management plans discussed with the patient, family and they are in agreement.  CODE STATUS: Full.  TOTAL TIME TAKING CARE OF THIS PATIENT: 35 minutes.   Prognosis very poor, palliative care involved. Family may decide in 1-2 days about extubation.  POSSIBLE D/C IN 1-2 DAYS, DEPENDING ON CLINICAL CONDITION.   Vaughan Basta M.D on 12/22/2016   Between 7am to 6pm - Pager - [phone]  After 6pm go to [email or web address] - [place]  [place]  [phone]  CC: Primary care physician; Lavera Guise, MD  Note: This dictation was prepared with Dragon dictation along with smaller phrase technology. Any transcriptional errors that result from this process are unintentional.

## 2016-12-22 NOTE — Progress Notes (Addendum)
Patient's cardiac rhythm in and out of afib, per Dr. Mortimer Fries restart amio gtt. Order placed and gtt started. Dennis Mullen

## 2016-12-22 NOTE — Progress Notes (Signed)
[facility] Progess Note     ASSESSMENT/PLAN   66 year old morbidly obese white male admitted to the ICU for acute respiratory failure secondary to CHF exacerbation in the setting of obstructive sleep apnea was intubated after failing BiPAP now on full vent support  Patient failed spontaneous weaning trial multiple times, patient with progressive resp failure fio2 100%   ACUTE HYPOXIC HYPERCAPNIC RESPIRATORY FAILURE FLUID OVERLOAD IN PT WITH CHF / AFIB / + TROPONIN ACUTE KIDNEY INJURY    PLAN Weaning trial and extubation if tolerated.  Antibiotics stopped this process is most likely related to cardiogenic pulmonary edema and CHF with non-STEMI Cardiology/Nephrology following. Rate control as per cards. Glycemic control. Tylenol prn. DVT/GI prophylaxis. Patient is now DNR. No family at bedside. Will need to discuss one way extubation    SUBJECTIVE:  Patient seen and examined at bedside.   Patient remains critically ill Patient on full vent support Patient not able to wean from vent today Patient with vent dysynchony      VITAL SIGNS: Temp:  [97.7 F (36.5 C)-101.5 F (38.6 C)] 98.6 F (37 C) (09/07 0700) Pulse Rate:  [45-106] 65 (09/07 0700) Resp:  [9-30] 30 (09/07 0700) BP: (87-208)/(48-100) 110/50 (09/07 0700) SpO2:  [88 %-100 %] 100 % (09/07 0700) FiO2 (%):  [30 %-100 %] 40 % (09/07 0500)   Intake/Output Summary (Last 24 hours) at 12/22/16 0749 Last data filed at 12/22/16 0700  Gross per 24 hour  Intake          2051.04 ml  Output             2445 ml  Net          -393.96 ml     PHYSICAL EXAMINATION: Physical Examination:   VS: BP (!) 110/50   Pulse 65   Temp 98.6 F (37 C)   Resp (!) 30   Ht 6\' 2"  (1.88 m)   Wt 290 lb (131.5 kg)   SpO2 100%   BMI 37.23 kg/m     General Appearance: intubated Neuro: sedated but opens eyes to verbal stimuli and moves all extremities HEENT: ETT + Pulmonary: decreased air entry  b/l, no wheezing heard Cardiovascular: Normal S1,S2.  No m/r/g.    Abdomen: Benign, Soft, non-tender, No masses Skin:   warm, no rashes, no ecchymosis  Extremities: normal, no cyanosis, clubbing   LABORATORY PANEL:   CBC  Recent Labs Lab 12/17/16 0305  WBC 12.2*  HGB 9.7*  HCT 30.1*  PLT 375    Chemistries   Recent Labs Lab 01/03/2017 0723  12/18/16 0446  12/20/16 0417  12/22/16 0502  NA 131*  < >  --   < > 140  < > 141  K 4.4  < >  --   < > 4.1  < > 4.4  CL 96*  < >  --   < > 100*  < > 98*  CO2 23  < >  --   < > 31  < > 31  GLUCOSE 523*  < >  --   < > 219*  < > 249*  BUN 47*  < >  --   < > 41*  < > 62*  CREATININE 2.13*  < >  --   < > 1.44*  < > 2.04*  CALCIUM 9.2  < >  --   < > 9.0  < > 9.1  MG  --   < > 2.1  --   --   --   --  PHOS  --   < > 4.5  --  3.8  --   --   AST 28  --   --   --   --   --   --   ALT 16*  --   --   --   --   --   --   ALKPHOS 87  --   --   --   --   --   --   BILITOT 0.8  --   --   --   --   --   --   < > = values in this interval not displayed.   Recent Labs Lab 12/22/16 0113 12/22/16 0205 12/22/16 0311 12/22/16 0410 12/22/16 0517 12/22/16 0558  GLUCAP 155* 173* 159* 198* 203* 214*    Recent Labs Lab 12/19/16 0332 12/21/16 2122 12/22/16 0211  PHART 7.35 7.32* 7.34*  PCO2ART 60* 68* 64*  PO2ART 75* 82* 113*    Recent Labs Lab 01/02/2017 0723 12/20/16 0417  AST 28  --   ALT 16*  --   ALKPHOS 87  --   BILITOT 0.8  --   ALBUMIN 3.4* 2.5*    Cardiac Enzymes  Recent Labs Lab 12/17/16 0305  TROPONINI 1.35*    Critical Care Time devoted to patient care services described in this note is 37 minutes.   Overall, patient is critically ill, prognosis is guarded.  Patient with Multiorgan failure and at high risk for cardiac arrest and death.    Corrin Parker, M.D.  [facility]  Medical Director [place] Director [facility] Cardio-Pulmonary Department

## 2016-12-22 NOTE — Progress Notes (Signed)
Patient desaturated to 85% O2 on monitor. Patient had eyes open awaken, coughing. Called respiratory therapy. Gave increased oxygen, in line suctioned . Minimal secretions obtained. Notified ICU NP. Received new orders, see EMAR. Will continue to monitor and assess patient closely.

## 2016-12-22 NOTE — Progress Notes (Signed)
Vent settings changed as per the instructions of Dr. Nelda Marseille.  Will obatain ABG around 1 am.  Bincy Varughese,AG-ACNP Pulmonary & Critical Care

## 2016-12-22 NOTE — Progress Notes (Signed)
Daily Progress Note   Patient Name: Dennis Mullen       Date: 12/22/2016 DOB: [birth year]  Age: 66 y.o. MRN#: [id number] Attending Physician: Vaughan Basta, * Primary Care Physician: Lavera Guise, MD Admit Date: 01/12/2017  Reason for Consultation/Follow-up: Establishing goals of care  Subjective: Dennis Mullen is still sedated on vent. A little worse today with needing increased vent support.   Length of Stay: 6  Current Medications: Scheduled Meds:  . chlorhexidine gluconate (MEDLINE KIT)  15 mL Mouth Rinse BID  . clopidogrel  75 mg Per NG tube Daily  . docusate  200 mg Per Tube BID  . enoxaparin (LOVENOX) injection  1 mg/kg Subcutaneous Q12H  . feeding supplement (PRO-STAT SUGAR FREE 64)  60 mL Per Tube TID  . fentaNYL (SUBLIMAZE) injection  50 mcg Intravenous Once  . furosemide  20 mg Intravenous Daily  . ipratropium-albuterol  3 mL Nebulization Q4H  . levothyroxine  50 mcg Per NG tube QAC breakfast  . mouth rinse  15 mL Mouth Rinse 10 times per day  . methylPREDNISolone (SOLU-MEDROL) injection  60 mg Intravenous Q6H  . metoprolol tartrate  25 mg Per NG tube BID  . pantoprazole sodium  40 mg Per Tube Daily  . rosuvastatin  20 mg Per Tube QHS  . sennosides  10 mL Per Tube BID    Continuous Infusions: . amiodarone Stopped (12/21/16 2035)  . feeding supplement (VITAL HIGH PROTEIN) Stopped (12/22/16 0406)  . fentaNYL infusion INTRAVENOUS 50 mcg/hr (12/22/16 0855)  . insulin (NOVOLIN-R) infusion 15.5 Units/hr (12/22/16 0920)  . propofol (DIPRIVAN) infusion 15 mcg/kg/min (12/22/16 0855)  . valproate sodium Stopped (12/21/16 2324)    PRN Meds: acetaminophen, bisacodyl, docusate sodium, fentaNYL, hydrALAZINE, ipratropium-albuterol  Physical Exam           Constitutional: He appears well-developed. He has a sickly appearance. He is intubated.  HENT:  Head: Normocephalicand atraumatic.  Cardiovascular: Normal rateand regular rhythm.  Pulmonary/Chest: No tachypnea. He is intubated. Moderate respiratory distress.  Abdominal: Soft. Normal appearance.  Neurological:  Sedated on vent Nursing noteand vitalsreviewed.  Vital Signs: BP (!) 118/49   Pulse 69   Temp 98.6 F (37 C)   Resp 14   Ht 6' 2"  (1.88 m)   Wt 131.5 kg (290 lb)   SpO2 100%  BMI 37.23 kg/m  SpO2: SpO2: 100 % O2 Device: O2 Device: Ventilator O2 Flow Rate:    Intake/output summary:   Intake/Output Summary (Last 24 hours) at 12/22/16 1000 Last data filed at 12/22/16 0800  Gross per 24 hour  Intake          1968.93 ml  Output             2375 ml  Net          -406.07 ml   LBM: Last BM Date: 12/20/16 Baseline Weight: Weight: 131.5 kg (290 lb) Most recent weight: Weight: 131.5 kg (290 lb)       Palliative Assessment/Data: Sedated on vent     Patient Active Problem List   Diagnosis Date Noted  . Ventilator dependence ([place])   . Acute renal failure superimposed on stage 3 chronic kidney disease ([place])   . Goals of care, counseling/discussion   . Palliative care encounter   . Acute on chronic systolic CHF (congestive heart failure) ([place]) 12/19/2016  . Acute respiratory failure ([place]) 12/22/2016  . Chronic diastolic heart failure ([place]) 12/15/2016  . Atrial fibrillation with rapid ventricular response ([place]) 12/07/2016  . Acute respiratory failure with hypoxia ([place]) 12/02/2016  . Demand ischemia ([place]) 12/02/2016  . Moderate aortic stenosis 12/02/2016  . Hyperkalemia 12/02/2016  . Non-STEMI (non-ST elevated myocardial infarction) ([place]) 11/30/2016  . Lymphedema 03/30/2016  . Left leg cellulitis 03/12/2016  . Insulin overdose 02/02/2016  . Hypoglycemia   . OSA on CPAP   . Depression   . Hypophosphatemia   . SOB (shortness of breath) 10/11/2015  .  Elevated troponin 10/11/2015  . Chest pain at rest 09/24/2015  . Elevated PSA 07/26/2015  . Hypogonadism in male 07/26/2015  . 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 BPH with obstruction/lower urinary tract symptoms 06/29/2015  . Bipolar 1 disorder, mixed ([place]) 04/14/2015  . Borderline personality disorder 04/14/2015  . Acute renal failure ([place]) 11/15/2014  . Chest pain 11/14/2014  . Dizziness 11/05/2014  . Benign essential HTN 11/04/2014  . Combined fat and carbohydrate induced hyperlipemia 11/04/2014  . Bipolar 2 disorder ([place]) 10/27/2014  . Acquired hypothyroidism 02/03/2014  . Type 2 diabetes mellitus ([place]) 02/03/2014  . Adiposity 01/06/2014  . Obesity, diabetes, and hypertension syndrome ([place]) 12/23/2013  . Long term current use of insulin ([place]) 12/23/2013  . Type 2 diabetes mellitus with other diabetic neurological complication ([place]) 83/66/2947  . Disorder affecting the body's metabolism 12/23/2013  . Elevated cholesterol with elevated triglycerides 11/18/2013  . CAD (coronary artery disease), native coronary artery 09/26/2013  . Diabetes mellitus ([place]) 09/26/2013  . Non-ST elevation (NSTEMI) myocardial infarction ([place]) 09/26/2013    Palliative Care Assessment & Plan   HPI: 66 y.o.malewith past medical history of bipolar disorder, atrial fibrillation, coronary artery disease, congestive heart failure, NSTEMI, HTN, diabetes mellitus type 2, GERD,sleep apnea, peripheral vascular disease, BPH. 4 admission d/t cardiac decline in the past month with VF arrest 12/08/16 with ROSCadmitted on 09/17/2018with respiratory distress s/t CHF exacerbation with OSA complicated by chronic kidney disease requiring intubation on admission 01/04/2017 and continues to fail to wean. Palliative care requested to assist with [place] with poor prognosis and poor vent weaning.   Assessment: I met again today with Dennis Mullen and his wife. We discussed his change in status since yesterday - requiring  higher FIO2. Still not weaning well. Dennis Mullen continues to remain hopeful. We discussed continuing this hope but planning for one way extubation if not progressing Monday. We  discussed that this would be planned so that everyone that would want to be present could be here. Emotional support provided.   Recommendations/Plan:  Constipation: Continue senokot daily.   Agitation: Titrated fentanyl + propofol per RN/PCCM.   Code Status:  DNR  Prognosis:   Unable to determine- but prognosis very poor given recent progression and history. Possibly eligible for hospice facility.   Discharge Planning:  To Be Determined  Care plan was discussed with Dr. Mortimer Fries  Thank you for allowing the Palliative Medicine Team to assist in the care of this patient.   Total Time 25 min Prolonged Time Billed  no       Greater than 50%  of this time was spent counseling and coordinating care related to the above assessment and plan.  Vinie Sill, NP Palliative Medicine Team Pager # [phone] (M-F 8a-5p) Team Phone # [phone] (Nights/Weekends)

## 2016-12-23 LAB — CBC
HEMATOCRIT: 27 % — AB (ref 40.0–52.0)
Hemoglobin: 8.8 g/dL — ABNORMAL LOW (ref 13.0–18.0)
MCH: 28.4 pg (ref 26.0–34.0)
MCHC: 32.6 g/dL (ref 32.0–36.0)
MCV: 87.2 fL (ref 80.0–100.0)
Platelets: 508 10*3/uL — ABNORMAL HIGH (ref 150–440)
RBC: 3.09 MIL/uL — ABNORMAL LOW (ref 4.40–5.90)
RDW: 17.5 % — AB (ref 11.5–14.5)
WBC: 18.7 10*3/uL — ABNORMAL HIGH (ref 3.8–10.6)

## 2016-12-23 LAB — GLUCOSE, CAPILLARY
GLUCOSE-CAPILLARY: 179 mg/dL — AB (ref 65–99)
GLUCOSE-CAPILLARY: 186 mg/dL — AB (ref 65–99)
GLUCOSE-CAPILLARY: 188 mg/dL — AB (ref 65–99)
GLUCOSE-CAPILLARY: 195 mg/dL — AB (ref 65–99)
GLUCOSE-CAPILLARY: 197 mg/dL — AB (ref 65–99)
GLUCOSE-CAPILLARY: 216 mg/dL — AB (ref 65–99)
GLUCOSE-CAPILLARY: 232 mg/dL — AB (ref 65–99)
GLUCOSE-CAPILLARY: 243 mg/dL — AB (ref 65–99)
Glucose-Capillary: 187 mg/dL — ABNORMAL HIGH (ref 65–99)
Glucose-Capillary: 174 mg/dL — ABNORMAL HIGH (ref 65–99)
Glucose-Capillary: 195 mg/dL — ABNORMAL HIGH (ref 65–99)
Glucose-Capillary: 242 mg/dL — ABNORMAL HIGH (ref 65–99)
Glucose-Capillary: 223 mg/dL — ABNORMAL HIGH (ref 65–99)
Glucose-Capillary: 189 mg/dL — ABNORMAL HIGH (ref 65–99)
Glucose-Capillary: 160 mg/dL — ABNORMAL HIGH (ref 65–99)
Glucose-Capillary: 168 mg/dL — ABNORMAL HIGH (ref 65–99)
Glucose-Capillary: 177 mg/dL — ABNORMAL HIGH (ref 65–99)
Glucose-Capillary: 181 mg/dL — ABNORMAL HIGH (ref 65–99)
Glucose-Capillary: 226 mg/dL — ABNORMAL HIGH (ref 65–99)
Glucose-Capillary: 198 mg/dL — ABNORMAL HIGH (ref 65–99)
Glucose-Capillary: 197 mg/dL — ABNORMAL HIGH (ref 65–99)
Glucose-Capillary: 163 mg/dL — ABNORMAL HIGH (ref 65–99)

## 2016-12-23 LAB — RENAL FUNCTION PANEL
Albumin: 2.3 g/dL — ABNORMAL LOW (ref 3.5–5.0)
Anion gap: 11 (ref 5–15)
BUN: 89 mg/dL — ABNORMAL HIGH (ref 6–20)
CALCIUM: 9 mg/dL (ref 8.9–10.3)
CO2: 32 mmol/L (ref 22–32)
CREATININE: 1.77 mg/dL — AB (ref 0.61–1.24)
Chloride: 99 mmol/L — ABNORMAL LOW (ref 101–111)
GFR calc non Af Amer: 39 mL/min — ABNORMAL LOW (ref >60)
GFR, EST AFRICAN AMERICAN: 45 mL/min — AB (ref >60)
Glucose, Bld: 242 mg/dL — ABNORMAL HIGH (ref 65–99)
Phosphorus: 4.5 mg/dL (ref 2.5–4.6)
Potassium: 4.8 mmol/L (ref 3.5–5.1)
SODIUM: 142 mmol/L (ref 135–145)

## 2016-12-23 LAB — PHOSPHORUS: Phosphorus: 4.4 mg/dL (ref 2.5–4.6)

## 2016-12-23 LAB — PROCALCITONIN: Procalcitonin: 0.37 ng/mL

## 2016-12-23 LAB — MAGNESIUM: Magnesium: 2.4 mg/dL (ref 1.7–2.4)

## 2016-12-23 MED ORDER — DEXTROSE 50 % IV SOLN: 50.0000 mL | Freq: Once | INTRAVENOUS | Status: DC

## 2016-12-23 NOTE — Consult Note (Signed)
[facility] NOTE  Pharmacy Consult for electrolytes/constipation prevention    No Known Allergies  Patient Measurements: Height: _0  (188 cm) Weight: 276 lb 0.3 oz (125.2 kg) IBW/kg (Calculated) : 82.2  Vital Signs: Temp: 101.1 F (38.4 C) (09/08 0634) Temp Source: Core (Comment) (09/08 0634) BP: 115/61 (09/08 0700) Pulse Rate: 98 (09/08 0700) Intake/Output from previous day: 09/07 0701 - 09/08 0700 In: 2335.8 [I.V.:1465.8; NG/GT:760; IV Piggyback:110] Out: 1900 [Urine:1900] Intake/Output from this shift: No intake/output data recorded. Vent settings for last 24 hours: Vent Mode: PRVC FiO2 (%):  [45 %-55 %] 55 % Set Rate:  [15 bmp] 15 bmp Vt Set:  [500 mL] 500 mL PEEP:  [5 cmH20] 5 cmH20 Plateau Pressure:  [7 cmH20-10 cmH20] 7 cmH20  Labs:  Recent Labs  12/22/16 0502 12/22/16 2312 12/23/16 0439  WBC  --  19.6* 18.7*  HGB  --  9.0* 8.8*  HCT  --  27.4* 27.0*  PLT  --  539* 508*  CREATININE 2.04* 1.84* 1.77*  MG  --  2.5* 2.4  PHOS  --  3.7 4.4  4.5  ALBUMIN  --   --  2.3*   Estimated Creatinine Clearance: 59.3 mL/min (A) (by C-G formula based on SCr of 1.77 mg/dL (H)).   Recent Labs  12/23/16 0631 12/23/16 0734 12/23/16 0823  GLUCAP 243* 223* 216*     Medications:  Scheduled:  . chlorhexidine gluconate (MEDLINE KIT)  15 mL Mouth Rinse BID  . clopidogrel  75 mg Per NG tube Daily  . docusate  200 mg Per Tube BID  . enoxaparin (LOVENOX) injection  1 mg/kg Subcutaneous Q12H  . feeding supplement (PRO-STAT SUGAR FREE 64)  60 mL Per Tube TID  . fentaNYL (SUBLIMAZE) injection  50 mcg Intravenous Once  . furosemide  20 mg Intravenous Daily  . ipratropium-albuterol  3 mL Nebulization Q4H  . levothyroxine  50 mcg Per NG tube QAC breakfast  . mouth rinse  15 mL Mouth Rinse 10 times per day  . methylPREDNISolone (SOLU-MEDROL) injection  60 mg Intravenous Daily  . metoprolol tartrate  25 mg Per NG tube BID  . pantoprazole sodium  40  mg Per Tube Daily  . rosuvastatin  20 mg Per Tube QHS  . sennosides  10 mL Per Tube BID    Assessment: Pharmacy consulted for electrolyte and constipation management for 66 yo male ICU patient admitted with respiratory distress. Patient is currently sedated on fentanyl and propofol.   Plan:  1. Electrolytes: no replacement warranted at this time. Will follow up with am labs.   2. Constipation: continue docusate 223m VT BID and senna 8.822mVT BID.   Pharmacy will continue to monitor and adjust per consult.   KaOlivia CanterRPSouth Shore Berry LLC/11/2016, 8:50AM

## 2016-12-23 NOTE — Progress Notes (Signed)
Patients propofol titrated off, fentanyl infusing for patient comfort. Insulin drip titrated hourly, amiod infusing. Spontaneous breathing trials attempted this am. Patient became agitated, increased respirations, and unable to follow commands. Tube feeds infusing and tolerating well. Foley intact with adequate urine output. Temp 98.4 off of cooling blanket. Family at bedside throughout the day.

## 2016-12-23 NOTE — Progress Notes (Signed)
[facility]  ROUNDING NOTE   Subjective:   Tmax 101.1  UOP 1900  Creatinine 1.77 (1.84)  Started on cefepime yesterday.    Objective:  Vital signs in last 24 hours:  Temp:  [98.2 F (36.8 C)-101.1 F (38.4 C)] 99.3 F (37.4 C) (09/08 1100) Pulse Rate:  [62-133] 103 (09/08 1000) Resp:  [9-31] 15 (09/08 1000) BP: (99-172)/(50-109) 159/85 (09/08 1000) SpO2:  [87 %-100 %] 96 % (09/08 1102) FiO2 (%):  [40 %-55 %] 40 % (09/08 1102) Weight:  [125.2 kg (276 lb 0.3 oz)] 125.2 kg (276 lb 0.3 oz) (09/08 0438)  Weight change:  Filed Weights   01/05/2017 0721 12/23/16 0438  Weight: 131.5 kg (290 lb) 125.2 kg (276 lb 0.3 oz)    Intake/Output: I/O last 3 completed shifts: In: 2951.6 [I.V.:1885.6; NG/GT:956; IV Piggyback:110] Out: 2951 [Urine:2580; Emesis/NG output:175]   Intake/Output this shift:  No intake/output data recorded.  Physical Exam: General: Critically ill appearing   Head: +ETT, +OGT  Eyes: Eyes closed  Neck: RIJ central line, obese  Lungs:  Crackles bilaterally, PRVC FIO2 55%  Heart: irregular   Abdomen:  Soft, nontender, obese  Extremities: no peripheral or dependent edema. Left lower extremity cyanosis  Neurologic: Intubated, sedated  Skin: No lesions       Basic Metabolic Panel:  Recent Labs Lab 01/09/2017 1447  12/18/16 0446  12/20/16 0417 12/21/16 0345 12/22/16 0502 12/22/16 2312 12/23/16 0439  NA 135  < >  --   < > 140 140 141 142 142  K 4.6  < >  --   < > 4.1 4.2 4.4 4.1 4.8  CL 100*  < >  --   < > 100* 100* 98* 99* 99*  CO2 25  < >  --   < > 31 31 31  32 32  GLUCOSE 199*  < >  --   < > 219* 156* 249* 182* 242*  BUN 51*  < >  --   < > 41* 51* 62* 80* 89*  CREATININE 2.16*  < >  --   < > 1.44* 1.32* 2.04* 1.84* 1.77*  CALCIUM 8.5*  < >  --   < > 9.0 8.9 9.1 9.2 9.0  MG 2.1  --  2.1  --   --   --   --  2.5* 2.4  PHOS 5.9*  --  4.5  --  3.8  --   --  3.7 4.4  4.5  < > = values in this interval not displayed.  Liver Function  Tests:  Recent Labs Lab 12/20/16 0417 12/23/16 0439  ALBUMIN 2.5* 2.3*   No results for input(s): LIPASE, AMYLASE in the last 168 hours. No results for input(s): AMMONIA in the last 168 hours.  CBC:  Recent Labs Lab 12/17/16 0305 12/22/16 2312 12/23/16 0439  WBC 12.2* 19.6* 18.7*  HGB 9.7* 9.0* 8.8*  HCT 30.1* 27.4* 27.0*  MCV 86.2 84.1 87.2  PLT 375 539* 508*    Cardiac Enzymes:  Recent Labs Lab 12/28/2016 1256 01/07/2017 1637 01/10/2017 2116 12/17/16 0305  TROPONINI 1.01* 1.20* 1.25* 1.35*    BNP: Invalid input(s): POCBNP  CBG:  Recent Labs Lab 12/23/16 0631 12/23/16 0734 12/23/16 0823 12/23/16 0929 12/23/16 1030  GLUCAP 243* 223* 216* 188* 197*    Microbiology: Results for orders placed or performed during the hospital encounter of 12/27/2016  Blood culture (routine x 2)     Status: None   Collection Time: 01/09/2017  7:24  AM  Result Value Ref Range Status   Specimen Description BLOOD RIGHT ANTECUBITAL  Final   Special Requests   Final    BOTTLES DRAWN AEROBIC AND ANAEROBIC Blood Culture adequate volume   Culture NO GROWTH 5 DAYS  Final   Report Status 12/21/2016 FINAL  Final  Blood culture (routine x 2)     Status: None   Collection Time: 12/29/2016  7:29 AM  Result Value Ref Range Status   Specimen Description BLOOD LEFT ANTECUBITAL  Final   Special Requests   Final    BOTTLES DRAWN AEROBIC AND ANAEROBIC Blood Culture adequate volume   Culture NO GROWTH 5 DAYS  Final   Report Status 12/21/2016 FINAL  Final  Culture, respiratory (NON-Expectorated)     Status: None   Collection Time: 12/18/16 10:00 AM  Result Value Ref Range Status   Specimen Description TRACHEAL ASPIRATE  Final   Special Requests NONE  Final   Gram Stain   Final    ABUNDANT WBC PRESENT,BOTH PMN AND MONONUCLEAR NO ORGANISMS SEEN    Culture   Final    NO GROWTH 2 DAYS Performed at [facility], [place] [address] [place], [place] [address]    Report Status 12/21/2016 FINAL   Final  CULTURE, BLOOD (ROUTINE X 2) w Reflex to ID Panel     Status: None (Preliminary result)   Collection Time: 12/22/16 11:11 AM  Result Value Ref Range Status   Specimen Description BLOOD BLOOD RIGHT HAND  Final   Special Requests   Final    BOTTLES DRAWN AEROBIC AND ANAEROBIC Blood Culture adequate volume   Culture NO GROWTH < 24 HOURS  Final   Report Status PENDING  Incomplete  CULTURE, BLOOD (ROUTINE X 2) w Reflex to ID Panel     Status: None (Preliminary result)   Collection Time: 12/22/16 11:31 AM  Result Value Ref Range Status   Specimen Description BLOOD BLOOD LEFT HAND  Final   Special Requests   Final    BOTTLES DRAWN AEROBIC AND ANAEROBIC Blood Culture adequate volume   Culture NO GROWTH < 24 HOURS  Final   Report Status PENDING  Incomplete  Culture, respiratory (NON-Expectorated)     Status: None (Preliminary result)   Collection Time: 12/22/16 11:50 AM  Result Value Ref Range Status   Specimen Description TRACHEAL ASPIRATE  Final   Special Requests NONE  Final   Gram Stain   Final    MODERATE WBC PRESENT,BOTH PMN AND MONONUCLEAR NO ORGANISMS SEEN    Culture   Final    CULTURE REINCUBATED FOR BETTER GROWTH Performed at [facility], [address] [place], [place] [address]    Report Status PENDING  Incomplete    Coagulation Studies: No results for input(s): LABPROT, INR in the last 72 hours.  Urinalysis: No results for input(s): COLORURINE, LABSPEC, PHURINE, GLUCOSEU, HGBUR, BILIRUBINUR, KETONESUR, PROTEINUR, UROBILINOGEN, NITRITE, LEUKOCYTESUR in the last 72 hours.  Invalid input(s): APPERANCEUR    Imaging: Dg Chest 1 View  Result Date: 12/21/2016 CLINICAL DATA:  Intubation. EXAM: CHEST 1 VIEW COMPARISON:  December 19, 2016 FINDINGS: The ETT is in good position. The NG tube is not well seen distally but terminates below the diaphragm. Significant improvement in bibasilar opacities. No other interval changes or acute abnormalities. IMPRESSION: 1.  Improving opacities in the bases.  Support apparatus as above. Electronically Signed   By: Dorise Bullion III M.D   On: 12/21/2016 21:19   Dg Chest [facility]  1 View  Result Date: 12/22/2016 CLINICAL DATA:  Dyspnea. History of diabetes, gastroesophageal reflux disease, CHF, hypertension. Nonsmoker. EXAM: PORTABLE CHEST 1 VIEW COMPARISON:  12/21/2016 FINDINGS: Endotracheal tube with tip measuring 3.9 cm above the carina. Enteric tube tip is off the field of view but below the left hemidiaphragm. Right central venous catheter tip is over the upper SVC region. No pneumothorax. Cardiac enlargement. Atelectasis in the lung bases. No blunting of costophrenic angles. No pneumothorax. IMPRESSION: Appliances appear in satisfactory position. Cardiac enlargement. Atelectasis in the lung bases. No significant change since prior study. Electronically Signed   By: Lucienne Capers M.D.   On: 12/22/2016 04:40     Medications:   . amiodarone 60 mg/hr (12/23/16 0937)  . ceFEPime (MAXIPIME) IV 2 g (12/23/16 0937)  . feeding supplement (VITAL HIGH PROTEIN) 1,000 mL (12/22/16 2000)  . fentaNYL infusion INTRAVENOUS 350 mcg/hr (12/23/16 0937)  . insulin (NOVOLIN-R) infusion 9.2 Units/hr (12/23/16 0635)  . propofol (DIPRIVAN) infusion Stopped (12/23/16 0700)  . valproate sodium 500 mg (12/23/16 1017)   . chlorhexidine gluconate (MEDLINE KIT)  15 mL Mouth Rinse BID  . clopidogrel  75 mg Per NG tube Daily  . docusate  200 mg Per Tube BID  . enoxaparin (LOVENOX) injection  1 mg/kg Subcutaneous Q12H  . feeding supplement (PRO-STAT SUGAR FREE 64)  60 mL Per Tube TID  . fentaNYL (SUBLIMAZE) injection  50 mcg Intravenous Once  . furosemide  20 mg Intravenous Daily  . ipratropium-albuterol  3 mL Nebulization Q4H  . levothyroxine  50 mcg Per NG tube QAC breakfast  . mouth rinse  15 mL Mouth Rinse 10 times per day  . methylPREDNISolone (SOLU-MEDROL) injection  60 mg Intravenous Daily  . metoprolol tartrate  25 mg Per NG tube  BID  . pantoprazole sodium  40 mg Per Tube Daily  . rosuvastatin  20 mg Per Tube QHS  . sennosides  10 mL Per Tube BID   acetaminophen, bisacodyl, docusate sodium, fentaNYL, hydrALAZINE, ipratropium-albuterol  Assessment/ Plan:  65 y.o. white male with anxiety, bipolar disorder, BPH, coronary artery disease, depression, diabetes mellitus type 2 insulin dependent, erectile dysfunction, GERD, gout, hypertension, hypothyroidism, peripheral neuropathy, obesity, chronic kidney disease stage III, peripheral vascular disease who was admitted to [facility] on [facility]/02/2017 for evaluation of chest pain. Upon evaluation the patient was found to be in atrial fibrillation versus flutter.   1. Acute renal failure on chronic kidney disease stage III Baseline creatinine 1-1.3 Acute renal failure secondary to acute respiratory failure, atrial fibrillation with RVR and aggressive diuretics. Chronic kidney disease secondary to diabetes, hypertension and vascular disease Not on ACE-I/ARB as outpatient.  - Continue furosemide 19m IV daily and monitor volume status and renal function.  No acute indication for dialysis at this time.   2. Acute respiratory failure and acute on chronic systolic heart failure. On mechanical ventilation. Echocardiogram reviewed. Moderately reduced systolic function - Appreciate pulmonary input - IV solumedrol  3. Anemia of chronic kidney disease. Hemoglobin 8.8. No EPO administered.   4. Diabetes mellitus type II with chronic kidney disease: insulin dependent.  - holding metformin   LOS: 7Gordon Macala Mullen 9/8/201811:18 AM

## 2016-12-23 NOTE — Progress Notes (Signed)
[facility] Critical Care Medicine Progess Note     ASSESSMENT/PLAN   66 year old morbidly obese white male admitted to the ICU for acute respiratory failure secondary to CHF exacerbation in the setting of obstructive sleep apnea was intubated after failing BiPAP now on full vent support  Patient failed spontaneous weaning trial multiple times, patient with progressive resp failure fio2 100%   ASSESSMENT ACUTE HYPOXIC HYPERCAPNIC RESPIRATORY FAILURE FLUID OVERLOAD IN PT WITH CHF / AFIB / + TROPONIN ACUTE KIDNEY INJURY  SEPSIS-worsening leukocytosis and persistent fever AFIB WITH RVR Hyperglycemia necessitating an insulin infusion  PLAN Full vent support with current settings Weaning trials and extubation if tolerated.  Restarted on cefepime 2/2 persistent fever and worsening WBC Cardiology/Nephrology following Glycemic control with insulin gtte Amio gtte per cardiology Tylenol prn. CXR and ABG prn DVT/GI prophylaxis. Patient is now DNR. No family at bedside. Overall prognosis is poor. Patient's family aware and palliative care involved. Will continue to work with patient's family regarding one way extubation    SUBJECTIVE:  Patient seen and examined at bedside.   Patient remains critically ill Patient on full vent support Patient not able to wean from vent today Vent dyssynchrony improved with currents settings  VITAL SIGNS: Temp:  [98.2 F (36.8 C)-101.1 F (38.4 C)] 101.1 F (38.4 C) (09/08 0634) Pulse Rate:  [62-133] 105 (09/08 0600) Resp:  [9-31] 16 (09/08 0600) BP: (99-172)/(49-109) 126/66 (09/08 0600) SpO2:  [87 %-100 %] 98 % (09/08 0600) FiO2 (%):  [45 %-60 %] 45 % (09/08 0400) Weight:  [276 lb 0.3 oz (125.2 kg)] 276 lb 0.3 oz (125.2 kg) (09/08 0438)   Intake/Output Summary (Last 24 hours) at 12/23/16 0708 Last data filed at 12/23/16 0600  Gross per 24 hour  Intake          2335.81 ml  Output             1900 ml  Net           435.81 ml      PHYSICAL EXAMINATION: Physical Examination:   VS: BP 126/66   Pulse (!) 105   Temp (!) 101.1 F (38.4 C) (Core (Comment)) Comment (Src): temp foley  Resp 16   Ht 6\' 2"  (1.88 m)   Wt 276 lb 0.3 oz (125.2 kg)   SpO2 98%   BMI 35.44 kg/m     General Appearance: intubated Neuro: sedated but opens eyes to verbal stimuli and moves all extremities HEENT: PERRLA, trachea midline,  ETT + Pulmonary: Breath sounds decreased bilaterally, no wheezing Cardiovascular: Normal S1,S2.  No m/r/g.    Abdomen: Benign, Soft, non-tender, No masses Skin:   warm, no rashes, no ecchymosis  Extremities: normal, no cyanosis, clubbing   LABORATORY PANEL:   CBC  Recent Labs Lab 12/23/16 0439  WBC 18.7*  HGB 8.8*  HCT 27.0*  PLT 508*    Chemistries   Recent Labs Lab 01/06/2017 0723  12/23/16 0439  NA 131*  < > 142  K 4.4  < > 4.8  CL 96*  < > 99*  CO2 23  < > 32  GLUCOSE 523*  < > 242*  BUN 47*  < > 89*  CREATININE 2.13*  < > 1.77*  CALCIUM 9.2  < > 9.0  MG  --   < > 2.4  PHOS  --   < > 4.4  4.5  AST 28  --   --   ALT 16*  --   --  ALKPHOS 87  --   --   BILITOT 0.8  --   --   < > = values in this interval not displayed.   Recent Labs Lab 12/23/16 0133 12/23/16 0234 12/23/16 0327 12/23/16 0434 12/23/16 0534 12/23/16 0631  GLUCAP 187* 174* 195* 242* 232* 243*    Recent Labs Lab 12/19/16 0332 12/21/16 2122 12/22/16 0211  PHART 7.35 7.32* 7.34*  PCO2ART 60* 68* 64*  PO2ART 75* 82* 113*    Recent Labs Lab 01/09/2017 0723 12/20/16 0417 12/23/16 0439  AST 28  --   --   ALT 16*  --   --   ALKPHOS 87  --   --   BILITOT 0.8  --   --   ALBUMIN 3.4* 2.5* 2.3*    Cardiac Enzymes  Recent Labs Lab 12/17/16 0305  TROPONINI 1.35*    Critical Care Time devoted to patient care services described in this note is 37 minutes.   Overall, patient is critically ill, prognosis is guarded.  Patient with Multiorgan failure and at high risk for cardiac arrest and  death.   William Schake S. [facility] ANP-BC Pulmonary and Critical Care Medicine [facility] Pager [phone] or [phone]

## 2016-12-23 NOTE — Progress Notes (Signed)
[facility] NAME: Dennis Mullen    MR#:  [id number]  DATE OF BIRTH:  08-13-1950  SUBJECTIVE:  CHIEF COMPLAINT:   Chief Complaint  Patient presents with  . Respiratory Distress   The patient is on vent and sedation, on amiodarone drip. REVIEW OF SYSTEMS:  Intubated, so can not give much details, sedated. ROS  DRUG ALLERGIES:  No Known Allergies  VITALS:  Blood pressure 131/72, pulse 94, temperature 99.3 F (37.4 C), resp. rate 15, height 6\' 2"  (1.88 m), weight 276 lb 0.3 oz (125.2 kg), SpO2 94 %.  PHYSICAL EXAMINATION:    GENERAL:  66 y.o.-year-old patient lying in the bed with acute distress, critical appearing. Obesity. EYES: Pupils equal, round, reactive to light. No scleral icterus. Extraocular muscles did not check, pt is sedated.  HEENT: Head atraumatic, normocephalic. Oropharynx and nasopharynx clear.  NECK:  Supple, no jugular venous distention. No thyroid enlargement, no tenderness.  LUNGS: Normal breath sounds bilaterally, no wheezing, b/l crepitation. On vent support via ET tube. CARDIOVASCULAR: S1, S2 normal. No murmurs, rubs, or gallops.  ABDOMEN: Soft, nontender, nondistended. Bowel sounds present. No organomegaly or mass.  EXTREMITIES: No pedal edema, cyanosis, or clubbing.  NEUROLOGIC: pt is on vent support, sedated. PSYCHIATRIC: sedated on vent.  SKIN: No obvious rash, lesion, or ulcer.   Physical Exam LABORATORY PANEL:   CBC  Recent Labs Lab 12/23/16 0439  WBC 18.7*  HGB 8.8*  HCT 27.0*  PLT 508*   ------------------------------------------------------------------------------------------------------------------  Chemistries   Recent Labs Lab 12/23/16 0439  NA 142  K 4.8  CL 99*  CO2 32  GLUCOSE 242*  BUN 89*  CREATININE 1.77*  CALCIUM 9.0  MG 2.4   ------------------------------------------------------------------------------------------------------------------  Cardiac  Enzymes  Recent Labs Lab 12/22/2016 2116 12/17/16 0305  TROPONINI 1.25* 1.35*   ------------------------------------------------------------------------------------------------------------------  RADIOLOGY:  Dg Chest 1 View  Result Date: 12/21/2016 CLINICAL DATA:  Intubation. EXAM: CHEST 1 VIEW COMPARISON:  December 19, 2016 FINDINGS: The ETT is in good position. The NG tube is not well seen distally but terminates below the diaphragm. Significant improvement in bibasilar opacities. No other interval changes or acute abnormalities. IMPRESSION: 1. Improving opacities in the bases.  Support apparatus as above. Electronically Signed   By: Dorise Bullion III M.D   On: 12/21/2016 21:19   Dg Chest Port 1 View  Result Date: 12/22/2016 CLINICAL DATA:  Dyspnea. History of diabetes, gastroesophageal reflux disease, CHF, hypertension. Nonsmoker. EXAM: PORTABLE CHEST 1 VIEW COMPARISON:  12/21/2016 FINDINGS: Endotracheal tube with tip measuring 3.9 cm above the carina. Enteric tube tip is off the field of view but below the left hemidiaphragm. Right central venous catheter tip is over the upper SVC region. No pneumothorax. Cardiac enlargement. Atelectasis in the lung bases. No blunting of costophrenic angles. No pneumothorax. IMPRESSION: Appliances appear in satisfactory position. Cardiac enlargement. Atelectasis in the lung bases. No significant change since prior study. Electronically Signed   By: Lucienne Capers M.D.   On: 12/22/2016 04:40    ASSESSMENT AND PLAN:     Came with respi distress and intubated, have CHF, renal failure, acidosis.    Remains on vent.   Failed weaning trials.   After discussion with palliative care and ICU team, family agreed on DNR. Want to wait for 1-2 days to check for response.  Active Problems:   Acute respiratory failure with hypoxia (HCC)   Acute on chronic systolic CHF (congestive heart failure) ([place])  Acute respiratory failure (HCC)   Acute renal failure  superimposed on stage 3 chronic kidney disease (HCC)   Goals of care, counseling/discussion   Palliative care encounter   Ventilator dependence ([place])  * Ac respi failure hypoxic   Ac on ch systolic CHF   HCA pneumonia- due to repeated admissions- suspected initially,, but ruled out , intencivist stopped Abx.   S/p intubation   Intencivist and cardio consult.   Difficult extubation and poor prognosis.   continue IV lasix, I/o monitor, renal func monitor.   vanc + zosyn were stopped, on cefepime.    Very high risk for recurrent symptoms and reintubation, failed trials, poor prognosis.  *  DM, better controlled    Removed his Insuline delivery device  on insulin drip.  * lactic acidosis    Likely due to CHF    Cont to monitor with vent support.   Improved.  * Uncontrolled Htn   BP was 308 systolic, ER started on nitro drip    Had drop in BP, so stopped. Now stable.  * hypothyroidism     Cont TSH.  * Elevated troponin   Likely coming down from NSTEMI last week.  Per cardiologist, no further intervention.  * Ac renal failure on CKD stage 3   Monitor closely with lasix use.   Appreciated help by nephrology.    Slightly improved, resumed lasix.   * CAD   Cont asa, plavix, metoprolol, rosuvastatin  *  A fib   COnt metoprolol and amiodarone.   Eliquis changed to lovenox [place] for now.    On amio drip now.  Very poor prognosis. All the records are reviewed and case discussed with Care Management/Social Workerr. Management plans discussed with the patient, family and they are in agreement.  CODE STATUS: DNR.  TOTAL TIME TAKING CARE OF THIS PATIENT: 35 minutes.   Prognosis very poor, palliative care involved. Family may decide in 1-2 days about extubation.  POSSIBLE D/C IN 1-2 DAYS, DEPENDING ON CLINICAL CONDITION.   Demetrios Loll M.D on 12/23/2016   Between 7am to 6pm - Pager - [phone]  After 6pm go to [email or web address] - password [email or web address] [place]  [facility]  Office  [phone]  CC: Primary care physician; Lavera Guise, MD  Note: This dictation was prepared with Dragon dictation along with smaller phrase technology. Any transcriptional errors that result from this process are unintentional.

## 2016-12-23 NOTE — Progress Notes (Signed)
Insulin gtt elevated above 30 unit per hour. Will delay gtt for 30 min. Notified Maggie NP and will continue per ICU glycemic control protocol.

## 2016-12-24 ENCOUNTER — Inpatient Hospital Stay: Payer: Medicare Other

## 2016-12-24 DIAGNOSIS — N170 Acute kidney failure with tubular necrosis: Secondary | ICD-10-CM

## 2016-12-24 DIAGNOSIS — N183 Chronic kidney disease, stage 3 (moderate): Secondary | ICD-10-CM

## 2016-12-24 LAB — BASIC METABOLIC PANEL
Anion gap: 11 (ref 5–15)
BUN: 92 mg/dL — ABNORMAL HIGH (ref 6–20)
CHLORIDE: 99 mmol/L — AB (ref 101–111)
CO2: 32 mmol/L (ref 22–32)
CREATININE: 1.66 mg/dL — AB (ref 0.61–1.24)
Calcium: 8.6 mg/dL — ABNORMAL LOW (ref 8.9–10.3)
GFR calc non Af Amer: 42 mL/min — ABNORMAL LOW (ref >60)
GFR, EST AFRICAN AMERICAN: 49 mL/min — AB (ref >60)
Glucose, Bld: 175 mg/dL — ABNORMAL HIGH (ref 65–99)
Potassium: 4.4 mmol/L (ref 3.5–5.1)
Sodium: 142 mmol/L (ref 135–145)

## 2016-12-24 LAB — BLOOD GAS, ARTERIAL
Acid-Base Excess: 7.2 mmol/L — ABNORMAL HIGH (ref 0.0–2.0)
Acid-Base Excess: 7.6 mmol/L — ABNORMAL HIGH (ref 0.0–2.0)
Bicarbonate: 35.9 mmol/L — ABNORMAL HIGH (ref 20.0–28.0)
Bicarbonate: 36.2 mmol/L — ABNORMAL HIGH (ref 20.0–28.0)
FIO2: 1
FIO2: 1
MECHANICAL RATE: 15
MECHVT: 500 mL
O2 Saturation: 91.6 %
O2 Saturation: 90.4 %
PATIENT TEMPERATURE: 37
PATIENT TEMPERATURE: 37
PEEP: 15 cmH2O
PH ART: 7.3 — AB (ref 7.350–7.450)
PO2 ART: 69 mmHg — AB (ref 83.0–108.0)
PO2 ART: 67 mmHg — AB (ref 83.0–108.0)
RATE: 20 resp/min
VT: 500 mL
pCO2 arterial: 73 mmHg (ref 32.0–48.0)
pCO2 arterial: 77 mmHg (ref 32.0–48.0)
pH, Arterial: 7.28 — ABNORMAL LOW (ref 7.350–7.450)

## 2016-12-24 LAB — GLUCOSE, CAPILLARY
GLUCOSE-CAPILLARY: 105 mg/dL — AB (ref 65–99)
GLUCOSE-CAPILLARY: 114 mg/dL — AB (ref 65–99)
GLUCOSE-CAPILLARY: 117 mg/dL — AB (ref 65–99)
GLUCOSE-CAPILLARY: 127 mg/dL — AB (ref 65–99)
GLUCOSE-CAPILLARY: 155 mg/dL — AB (ref 65–99)
GLUCOSE-CAPILLARY: 162 mg/dL — AB (ref 65–99)
GLUCOSE-CAPILLARY: 191 mg/dL — AB (ref 65–99)
GLUCOSE-CAPILLARY: 195 mg/dL — AB (ref 65–99)
GLUCOSE-CAPILLARY: 198 mg/dL — AB (ref 65–99)
GLUCOSE-CAPILLARY: 212 mg/dL — AB (ref 65–99)
GLUCOSE-CAPILLARY: 219 mg/dL — AB (ref 65–99)
GLUCOSE-CAPILLARY: 239 mg/dL — AB (ref 65–99)
GLUCOSE-CAPILLARY: 243 mg/dL — AB (ref 65–99)
GLUCOSE-CAPILLARY: 253 mg/dL — AB (ref 65–99)
Glucose-Capillary: 192 mg/dL — ABNORMAL HIGH (ref 65–99)
Glucose-Capillary: 203 mg/dL — ABNORMAL HIGH (ref 65–99)
Glucose-Capillary: 204 mg/dL — ABNORMAL HIGH (ref 65–99)
Glucose-Capillary: 209 mg/dL — ABNORMAL HIGH (ref 65–99)
Glucose-Capillary: 141 mg/dL — ABNORMAL HIGH (ref 65–99)
Glucose-Capillary: 117 mg/dL — ABNORMAL HIGH (ref 65–99)
Glucose-Capillary: 100 mg/dL — ABNORMAL HIGH (ref 65–99)
Glucose-Capillary: 102 mg/dL — ABNORMAL HIGH (ref 65–99)
Glucose-Capillary: 167 mg/dL — ABNORMAL HIGH (ref 65–99)
Glucose-Capillary: 227 mg/dL — ABNORMAL HIGH (ref 65–99)
Glucose-Capillary: 187 mg/dL — ABNORMAL HIGH (ref 65–99)
Glucose-Capillary: 238 mg/dL — ABNORMAL HIGH (ref 65–99)
Glucose-Capillary: 214 mg/dL — ABNORMAL HIGH (ref 65–99)

## 2016-12-24 LAB — CULTURE, RESPIRATORY: CULTURE: NORMAL

## 2016-12-24 LAB — URINE CULTURE: Culture: NO GROWTH

## 2016-12-24 LAB — APTT: APTT: 42 s — AB (ref 24–36)

## 2016-12-24 LAB — PHOSPHORUS: PHOSPHORUS: 4 mg/dL (ref 2.5–4.6)

## 2016-12-24 LAB — CULTURE, RESPIRATORY W GRAM STAIN

## 2016-12-24 LAB — PROTIME-INR
INR: 1.31
PROTHROMBIN TIME: 16.2 s — AB (ref 11.4–15.2)

## 2016-12-24 LAB — MAGNESIUM: Magnesium: 2.5 mg/dL — ABNORMAL HIGH (ref 1.7–2.4)

## 2016-12-24 LAB — PROCALCITONIN: Procalcitonin: 0.39 ng/mL

## 2016-12-24 MED ORDER — FENTANYL BOLUS VIA INFUSION
50.0000 ug | INTRAVENOUS | Status: DC | PRN
  Administered 2016-12-24: 50 ug via INTRAVENOUS
  Administered 2016-12-24: 100 ug via INTRAVENOUS
  Administered 2016-12-25 (×3): 50 ug via INTRAVENOUS
  Filled 2016-12-24: qty 100

## 2016-12-24 MED ORDER — SODIUM CHLORIDE 0.9% FLUSH
10.0000 mL | Freq: Two times a day (BID) | INTRAVENOUS | Status: DC
  Administered 2016-12-24: 20 mL
  Administered 2016-12-24: 30 mL
  Administered 2016-12-25 (×2): 10 mL

## 2016-12-24 MED ORDER — SODIUM CHLORIDE 0.9% FLUSH: 10.0000 mL | INTRAVENOUS | Status: DC | PRN

## 2016-12-24 MED ORDER — FUROSEMIDE 10 MG/ML IJ SOLN
20.0000 mg | Freq: Two times a day (BID) | INTRAMUSCULAR | Status: DC
  Administered 2016-12-24 – 2016-12-25 (×4): 20 mg via INTRAVENOUS
  Filled 2016-12-24 (×4): qty 2

## 2016-12-24 MED ORDER — MIDAZOLAM HCL 2 MG/2ML IJ SOLN: 2.0000 mg | INTRAMUSCULAR | Status: DC | PRN

## 2016-12-24 MED ORDER — FUROSEMIDE 10 MG/ML IJ SOLN
40.0000 mg | Freq: Once | INTRAMUSCULAR | Status: AC
  Administered 2016-12-24: 40 mg via INTRAVENOUS
  Filled 2016-12-24: qty 4

## 2016-12-24 MED ORDER — PANTOPRAZOLE SODIUM 40 MG IV SOLR
40.0000 mg | Freq: Two times a day (BID) | INTRAVENOUS | Status: DC
  Administered 2016-12-24 (×3): 40 mg via INTRAVENOUS
  Filled 2016-12-24 (×3): qty 40

## 2016-12-24 MED ORDER — FUROSEMIDE 10 MG/ML IJ SOLN: 20.0000 mg | Freq: Two times a day (BID) | INTRAMUSCULAR | Status: DC

## 2016-12-24 NOTE — Progress Notes (Signed)
Pt awoke and sats dropped to low 80s. Called NP to inform. Ordered increase Fentanyl bolus and added Versed as needed. Went up on Fentanyl gtt and gave bolus. Called RT as well  and increased to 100%. RT also changed pulse ox and suctioned patient.

## 2016-12-24 NOTE — Progress Notes (Addendum)
[facility] [place] NAME: Dennis Mullen    MR#:  [id number]  DATE OF BIRTH:  [birth year]  SUBJECTIVE:  CHIEF COMPLAINT:   Chief Complaint  Patient presents with  . Respiratory Distress   The patient is on vent (O2 100%, PEEP 15) and sedation, on amiodarone drip. REVIEW OF SYSTEMS:  Intubated, so can not give much details, sedated. ROS  DRUG ALLERGIES:  No Known Allergies  VITALS:  Blood pressure 119/60, pulse 68, temperature 98.7 F (37.1 C), temperature source Oral, resp. rate (!) 24, height 6\' 2"  (1.88 m), weight 281 lb 1.4 oz (127.5 kg), SpO2 96 %.  PHYSICAL EXAMINATION:    GENERAL:  66 y.o.-year-old patient lying in the bed with acute distress, critical appearing. Obesity. EYES: Pupils equal, round, reactive to light. No scleral icterus. Extraocular muscles did not check, pt is sedated.  HEENT: Head atraumatic, normocephalic. Oropharynx and nasopharynx clear.  NECK:  Supple, no jugular venous distention. No thyroid enlargement, no tenderness.  LUNGS: Normal breath sounds bilaterally, no wheezing, b/l crepitation. On vent support via ET tube. CARDIOVASCULAR: S1, S2 normal. No murmurs, rubs, or gallops.  ABDOMEN: Soft, nontender, nondistended. Bowel sounds present. No organomegaly or mass.  EXTREMITIES: No pedal edema, cyanosis, or clubbing.  NEUROLOGIC: pt is on vent support, sedated. PSYCHIATRIC: sedated on vent.  SKIN: No obvious rash, lesion, or ulcer.   Physical Exam LABORATORY PANEL:   CBC  Recent Labs Lab 12/23/16 0439  WBC 18.7*  HGB 8.8*  HCT 27.0*  PLT 508*   ------------------------------------------------------------------------------------------------------------------  Chemistries   Recent Labs Lab 12/24/16 0225  NA 142  K 4.4  CL 99*  CO2 32  GLUCOSE 175*  BUN 92*  CREATININE 1.66*  CALCIUM 8.6*  MG 2.5*    ------------------------------------------------------------------------------------------------------------------  Cardiac Enzymes No results for input(s): TROPONINI in the last 168 hours. ------------------------------------------------------------------------------------------------------------------  RADIOLOGY:  Dg Chest Port 1 View  Result Date: 12/24/2016 CLINICAL DATA:  Acute respiratory failure EXAM: PORTABLE CHEST 1 VIEW COMPARISON:  12/22/2016 FINDINGS: Endotracheal tube with tip measuring 5.6 cm above the carina. Right central venous catheter with tip over the upper SVC region. Enteric tube tip is not visualized below the diaphragms. This is likely due to technique. Cardiac enlargement with pulmonary vascular congestion. Bilateral perihilar airspace and interstitial infiltrates. This likely represents edema but could indicate aspiration, pneumonia, or ARDS in the appropriate setting. No pneumothorax. Mild blunting of costophrenic angles suggesting small effusions. IMPRESSION: Appliances appear in satisfactory position. Cardiac enlargement with pulmonary vascular congestion and perihilar infiltrates consistent with edema, aspiration, pneumonia, or ARDS, depending on the clinical setting. Small bilateral pleural effusions. Electronically Signed   By: Lucienne Capers M.D.   On: 12/24/2016 02:33    ASSESSMENT AND PLAN:     Came with respi distress and intubated, have CHF, renal failure, acidosis.    Remains on vent.   Failed weaning trials.   After discussion with palliative care and ICU team, family agreed on DNR. Want to wait for 1-2 days to check for response.  Active Problems:   Acute respiratory failure with hypoxia (HCC)   Acute on chronic systolic CHF (congestive heart failure) (HCC)   Acute respiratory failure (HCC)   Acute renal failure superimposed on stage 3 chronic kidney disease (HCC)   Goals of care, counseling/discussion   Palliative care encounter   Ventilator  dependence ([place])  * Ac respi failure hypoxic   Ac on ch systolic CHF   HCA pneumonia-  due to repeated admissions- suspected initially, but ruled out , intencivist stopped Abx.   S/p intubation   Intencivist and cardio consult.   Difficult extubation and poor prognosis.   continue IV lasix, I/o monitor, renal func monitor.   vanc + zosyn were stopped, on cefepime.    Very high risk for recurrent symptoms and reintubation, failed trials, poor prognosis.  *  DM, better controlled    Removed his Insuline delivery device  on insulin drip.  * lactic acidosis    Likely due to CHF    Cont to monitor with vent support.   Improved.  * Uncontrolled Htn   BP was 419 systolic, ER started on nitro drip    Had drop in BP, so stopped. Now stable.  * hypothyroidism     Cont TSH.  * Elevated troponin   Likely coming down from NSTEMI last week.  Per cardiologist, no further intervention.  * Ac renal failure on CKD stage 3   Monitor closely with lasix use.   Appreciated help by nephrology.    Slightly improved, resumed lasix.   * CAD   Cont asa, plavix, metoprolol, rosuvastatin  *  A fib   COnt metoprolol and amiodarone.   Eliquis changed to lovenox [place] for now.    On amio drip now.  Very poor prognosis. The son said they are awaiting for more family members to see him and then extubate in 2 days. All the records are reviewed and case discussed with Care Management/Social Workerr. Management plans discussed with the patient's son and other family members and they are in agreement.  CODE STATUS: DNR.  TOTAL TIME TAKING CARE OF THIS PATIENT: 25 minutes.  POSSIBLE D/C IN ? DAYS, DEPENDING ON CLINICAL CONDITION.   Dennis Mullen M.D on 12/24/2016   Between 7am to 6pm - Pager - [phone]  After 6pm go to [email or web address] - password [email or web address] [place] [facility]  Office  [phone]  CC: Primary care physician; Lavera Guise, MD  Note: This dictation was prepared  with Dragon dictation along with smaller phrase technology. Any transcriptional errors that result from this process are unintentional.

## 2016-12-24 NOTE — Progress Notes (Signed)
Dr. Mortimer Fries rounded on patient and discussed plan of care. Per MD no full [facility] assessment today due to clinical condition and response yesterday to [place]. MD did still approve RASS goal of -1 and RN titrating sedation to achieve. Team will continue to monitor.

## 2016-12-24 NOTE — Consult Note (Signed)
[facility] NOTE  Pharmacy Consult for electrolytes/constipation prevention    No Known Allergies  Patient Measurements: Height: _0  (188 cm) Weight: 281 lb 1.4 oz (127.5 kg) IBW/kg (Calculated) : 82.2  Vital Signs: Temp: 99.3 F (37.4 C) (09/09 0800) Temp Source: Core (Comment) (09/09 0800) BP: 125/63 (09/09 0800) Pulse Rate: 70 (09/09 0800) Intake/Output from previous day: 09/08 0701 - 09/09 0700 In: 3475.9 [I.V.:2160.9; NG/GT:1000; IV Piggyback:315] Out: 2200 [Urine:2200] Intake/Output from this shift: No intake/output data recorded. Vent settings for last 24 hours: Vent Mode: PRVC FiO2 (%):  [40 %-100 %] 80 % Set Rate:  [15 bmp-26 bmp] 20 bmp Vt Set:  [500 mL] 500 mL PEEP:  [5 cmH20] 5 cmH20 Pressure Support:  [5 cmH20] 5 cmH20 Plateau Pressure:  [14 cmH20-17 cmH20] 14 cmH20  Labs:  Recent Labs  12/22/16 2312 12/23/16 0439 12/24/16 0225 12/24/16 0503  WBC 19.6* 18.7*  --   --   HGB 9.0* 8.8*  --   --   HCT 27.4* 27.0*  --   --   PLT 539* 508*  --   --   APTT  --   --   --  42*  INR  --   --   --  1.31  CREATININE 1.84* 1.77* 1.66*  --   MG 2.5* 2.4 2.5*  --   PHOS 3.7 4.4  4.5 4.0  --   ALBUMIN  --  2.3*  --   --    Estimated Creatinine Clearance: 63.8 mL/min (A) (by C-G formula based on SCr of 1.66 mg/dL (H)).   Recent Labs  12/24/16 0558 12/24/16 0704 12/24/16 0717  GLUCAP 100* 102* 114*     Medications:  Scheduled:  . chlorhexidine gluconate (MEDLINE KIT)  15 mL Mouth Rinse BID  . clopidogrel  75 mg Per NG tube Daily  . dextrose  50 mL Intravenous Once  . docusate  200 mg Per Tube BID  . enoxaparin (LOVENOX) injection  1 mg/kg Subcutaneous Q12H  . feeding supplement (PRO-STAT SUGAR FREE 64)  60 mL Per Tube TID  . fentaNYL (SUBLIMAZE) injection  50 mcg Intravenous Once  . furosemide  20 mg Intravenous Q12H  . ipratropium-albuterol  3 mL Nebulization Q4H  . levothyroxine  50 mcg Per NG tube QAC breakfast  . mouth  rinse  15 mL Mouth Rinse 10 times per day  . methylPREDNISolone (SOLU-MEDROL) injection  60 mg Intravenous Daily  . metoprolol tartrate  25 mg Per NG tube BID  . pantoprazole (PROTONIX) IV  40 mg Intravenous Q12H  . rosuvastatin  20 mg Per Tube QHS  . sennosides  10 mL Per Tube BID  . sodium chloride flush  10-40 mL Intracatheter Q12H    Assessment: Pharmacy consulted for electrolyte and constipation management for 66 yo male ICU patient admitted with respiratory distress. Patient is currently sedated on fentanyl and propofol.   Plan:  1. Electrolytes: no replacement warranted at this time. Will follow up with am labs.   2. Constipation: continue docusate 254m VT BID and senna 8.821mVT BID.   Pharmacy will continue to monitor and adjust per consult.   [facility]/12/2016, 8:50AM

## 2016-12-24 NOTE — Progress Notes (Signed)
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 Was present on last and prior admission. New abraided area on right ear discovered by day shift nurse from irritation to the ear from the ETT strap, discovered during handoff. From [facility] assessment, not a stage III pressure area, will follow up with wound treatment nurse.

## 2016-12-24 NOTE — Progress Notes (Signed)
[facility]  ROUNDING NOTE   Subjective:   Tmax 103.6  UOP 2200  Family at bedside.   Objective:  Vital signs in last 24 hours:  Temp:  [98.1 F (36.7 C)-103.6 F (39.8 C)] 98.1 F (36.7 C) (09/09 1000) Pulse Rate:  [67-113] 67 (09/09 1100) Resp:  [12-25] 20 (09/09 1100) BP: (90-163)/(47-87) 120/64 (09/09 1100) SpO2:  [82 %-100 %] 100 % (09/09 1100) FiO2 (%):  [50 %-100 %] 100 % (09/09 1107) Weight:  [127.5 kg (281 lb 1.4 oz)] 127.5 kg (281 lb 1.4 oz) (09/09 0446)  Weight change: 2.3 kg (5 lb 1.1 oz) Filed Weights   12/17/2016 0721 12/23/16 0438 12/24/16 0446  Weight: 131.5 kg (290 lb) 125.2 kg (276 lb 0.3 oz) 127.5 kg (281 lb 1.4 oz)    Intake/Output: I/O last 3 completed shifts: In: 4923.7 [I.V.:3128.7; NG/GT:1480; IV Piggyback:315] Out: 3050 [Urine:3050]   Intake/Output this shift:  Total I/O In: 661.3 [I.V.:261.3; NG/GT:400] Out: 555 [Urine:555]  Physical Exam: General: Critically ill    Head: +ETT, +OGT  Eyes: Eyes closed  Neck: RIJ central line, obese  Lungs:  Crackles bilaterally, PRVC FIO2 100%  Heart: irregular   Abdomen:  Soft, nontender, obese  Extremities: no peripheral or dependent edema. Left lower extremity cyanosis  Neurologic: Intubated, sedated  Skin: No lesions       Basic Metabolic Panel:  Recent Labs Lab 12/18/16 0446  12/20/16 0417 12/21/16 0345 12/22/16 0502 12/22/16 2312 12/23/16 0439 12/24/16 0225  NA  --   < > 140 140 141 142 142 142  K  --   < > 4.1 4.2 4.4 4.1 4.8 4.4  CL  --   < > 100* 100* 98* 99* 99* 99*  CO2  --   < > 31 31 31  32 32 32  GLUCOSE  --   < > 219* 156* 249* 182* 242* 175*  BUN  --   < > 41* 51* 62* 80* 89* 92*  CREATININE  --   < > 1.44* 1.32* 2.04* 1.84* 1.77* 1.66*  CALCIUM  --   < > 9.0 8.9 9.1 9.2 9.0 8.6*  MG 2.1  --   --   --   --  2.5* 2.4 2.5*  PHOS 4.5  --  3.8  --   --  3.7 4.4  4.5 4.0  < > = values in this interval not displayed.  Liver Function Tests:  Recent Labs Lab  12/20/16 0417 12/23/16 0439  ALBUMIN 2.5* 2.3*   No results for input(s): LIPASE, AMYLASE in the last 168 hours. No results for input(s): AMMONIA in the last 168 hours.  CBC:  Recent Labs Lab 12/22/16 2312 12/23/16 0439  WBC 19.6* 18.7*  HGB 9.0* 8.8*  HCT 27.4* 27.0*  MCV 84.1 87.2  PLT 539* 508*    Cardiac Enzymes: No results for input(s): CKTOTAL, CKMB, CKMBINDEX, TROPONINI in the last 168 hours.  BNP: Invalid input(s): POCBNP  CBG:  Recent Labs Lab 12/24/16 0717 12/24/16 0811 12/24/16 0912 12/24/16 1002 12/24/16 1104  GLUCAP 114* 105* 117* 127* 167*    Microbiology: Results for orders placed or performed during the hospital encounter of 01/12/2017  Blood culture (routine x 2)     Status: None   Collection Time: 01/13/2017  7:24 AM  Result Value Ref Range Status   Specimen Description BLOOD RIGHT ANTECUBITAL  Final   Special Requests   Final    BOTTLES DRAWN AEROBIC AND ANAEROBIC Blood Culture adequate volume  Culture NO GROWTH 5 DAYS  Final   Report Status 12/21/2016 FINAL  Final  Blood culture (routine x 2)     Status: None   Collection Time: 01/02/2017  7:29 AM  Result Value Ref Range Status   Specimen Description BLOOD LEFT ANTECUBITAL  Final   Special Requests   Final    BOTTLES DRAWN AEROBIC AND ANAEROBIC Blood Culture adequate volume   Culture NO GROWTH 5 DAYS  Final   Report Status 12/21/2016 FINAL  Final  Culture, respiratory (NON-Expectorated)     Status: None   Collection Time: 12/18/16 10:00 AM  Result Value Ref Range Status   Specimen Description TRACHEAL ASPIRATE  Final   Special Requests NONE  Final   Gram Stain   Final    ABUNDANT WBC PRESENT,BOTH PMN AND MONONUCLEAR NO ORGANISMS SEEN    Culture   Final    NO GROWTH 2 DAYS Performed at [facility], [place] [address], [place], [place] [address]    Report Status 12/21/2016 FINAL  Final  CULTURE, BLOOD (ROUTINE X 2) w Reflex to ID Panel     Status: None (Preliminary result)    Collection Time: 12/22/16 11:11 AM  Result Value Ref Range Status   Specimen Description BLOOD BLOOD RIGHT HAND  Final   Special Requests   Final    BOTTLES DRAWN AEROBIC AND ANAEROBIC Blood Culture adequate volume   Culture NO GROWTH 2 DAYS  Final   Report Status PENDING  Incomplete  CULTURE, BLOOD (ROUTINE X 2) w Reflex to ID Panel     Status: None (Preliminary result)   Collection Time: 12/22/16 11:31 AM  Result Value Ref Range Status   Specimen Description BLOOD BLOOD LEFT HAND  Final   Special Requests   Final    BOTTLES DRAWN AEROBIC AND ANAEROBIC Blood Culture adequate volume   Culture NO GROWTH 2 DAYS  Final   Report Status PENDING  Incomplete  Culture, respiratory (NON-Expectorated)     Status: None (Preliminary result)   Collection Time: 12/22/16 11:50 AM  Result Value Ref Range Status   Specimen Description TRACHEAL ASPIRATE  Final   Special Requests NONE  Final   Gram Stain   Final    MODERATE WBC PRESENT,BOTH PMN AND MONONUCLEAR NO ORGANISMS SEEN    Culture   Final    CULTURE REINCUBATED FOR BETTER GROWTH Performed at [facility], [address] [place], [place] [address]    Report Status PENDING  Incomplete  Urine Culture     Status: None   Collection Time: 12/22/16  4:28 PM  Result Value Ref Range Status   Specimen Description URINE, RANDOM  Final   Special Requests NONE  Final   Culture   Final    NO GROWTH Performed at [facility], [address], [place],  [address]    Report Status 12/24/2016 FINAL  Final    Coagulation Studies:  Recent Labs  12/24/16 0503  LABPROT 16.2*  INR 1.31    Urinalysis: No results for input(s): COLORURINE, LABSPEC, PHURINE, GLUCOSEU, HGBUR, BILIRUBINUR, KETONESUR, PROTEINUR, UROBILINOGEN, NITRITE, LEUKOCYTESUR in the last 72 hours.  Invalid input(s): APPERANCEUR    Imaging: Dg Chest Port 1 View  Result Date: 12/24/2016 CLINICAL DATA:  Acute respiratory failure EXAM: PORTABLE CHEST 1 VIEW  COMPARISON:  12/22/2016 FINDINGS: Endotracheal tube with tip measuring 5.6 cm above the carina. Right central venous catheter with tip over the upper SVC region. Enteric tube tip is not  visualized below the diaphragms. This is likely due to technique. Cardiac enlargement with pulmonary vascular congestion. Bilateral perihilar airspace and interstitial infiltrates. This likely represents edema but could indicate aspiration, pneumonia, or ARDS in the appropriate setting. No pneumothorax. Mild blunting of costophrenic angles suggesting small effusions. IMPRESSION: Appliances appear in satisfactory position. Cardiac enlargement with pulmonary vascular congestion and perihilar infiltrates consistent with edema, aspiration, pneumonia, or ARDS, depending on the clinical setting. Small bilateral pleural effusions. Electronically Signed   By: Lucienne Capers M.D.   On: 12/24/2016 02:33     Medications:   . amiodarone 60 mg/hr (12/24/16 0932)  . ceFEPime (MAXIPIME) IV Stopped (12/24/16 1003)  . feeding supplement (VITAL HIGH PROTEIN) 1,000 mL (12/24/16 0800)  . fentaNYL infusion INTRAVENOUS 250 mcg/hr (12/24/16 1109)  . insulin (NOVOLIN-R) infusion 5.7 Units/hr (12/24/16 1106)  . propofol (DIPRIVAN) infusion Stopped (12/24/16 0900)  . valproate sodium 500 mg (12/24/16 1047)   . chlorhexidine gluconate (MEDLINE KIT)  15 mL Mouth Rinse BID  . clopidogrel  75 mg Per NG tube Daily  . dextrose  50 mL Intravenous Once  . docusate  200 mg Per Tube BID  . enoxaparin (LOVENOX) injection  1 mg/kg Subcutaneous Q12H  . feeding supplement (PRO-STAT SUGAR FREE 64)  60 mL Per Tube TID  . fentaNYL (SUBLIMAZE) injection  50 mcg Intravenous Once  . furosemide  20 mg Intravenous Q12H  . ipratropium-albuterol  3 mL Nebulization Q4H  . levothyroxine  50 mcg Per NG tube QAC breakfast  . mouth rinse  15 mL Mouth Rinse 10 times per day  . methylPREDNISolone (SOLU-MEDROL) injection  60 mg Intravenous Daily  . metoprolol  tartrate  25 mg Per NG tube BID  . pantoprazole (PROTONIX) IV  40 mg Intravenous Q12H  . rosuvastatin  20 mg Per Tube QHS  . sennosides  10 mL Per Tube BID  . sodium chloride flush  10-40 mL Intracatheter Q12H   acetaminophen, bisacodyl, docusate sodium, fentaNYL, hydrALAZINE, ipratropium-albuterol, midazolam, sodium chloride flush  Assessment/ Plan:  66 y.o. white male with anxiety, bipolar disorder, BPH, coronary artery disease, depression, diabetes mellitus type 2 insulin dependent, erectile dysfunction, GERD, gout, hypertension, hypothyroidism, peripheral neuropathy, obesity, chronic kidney disease stage III, peripheral vascular disease who was admitted to [facility] on 01/03/2017 for evaluation of chest pain. Upon evaluation the patient was found to be in atrial fibrillation versus flutter. Patient with acute respiratory failure and now with pneumonia  1. Acute renal failure on chronic kidney disease stage III Baseline creatinine 1-1.3 Acute renal failure secondary to acute respiratory failure, atrial fibrillation with RVR and aggressive diuretics. Chronic kidney disease secondary to diabetes, hypertension and vascular disease Not on ACE-I/ARB as outpatient.  - Continue furosemide 90m IV daily and monitor volume status and renal function.  No acute indication for dialysis at this time.   2. Acute respiratory failure and acute on chronic systolic heart failure. On mechanical ventilation. Echocardiogram reviewed. Moderately reduced systolic function - Appreciate pulmonary input - IV solumedrol - empiric antibiotics.   3. Anemia of chronic kidney disease. Hemoglobin 8.8. No EPO administered.   4. Diabetes mellitus type II with chronic kidney disease: insulin dependent.  - holding metformin  Plan on one way extubated tomorrow.    L[place]: [place]/9/201811:36 AM

## 2016-12-24 NOTE — Progress Notes (Signed)
[facility] Progess Note     ASSESSMENT/PLAN   66 year old morbidly obese white male admitted to the ICU for acute respiratory failure secondary to systolic CHF exacerbation with intermittent rapid afib in the setting of obstructive sleep apnea was intubated after failing BiPAP now on full vent support. Patient has failed multiple weaning trial over last 4 days due to severe cardiomyopathy.  Patient has failed spontaneous weaning trial multiple times, patient with progressive resp failure fio2 80% Patients prognosis is very poor. Patient is now a DNR, plan is for one way extubation in next 24 hrs and assess for comfort care measures. Palliative care input appreaciated  ACUTE HYPOXIC HYPERCAPNIC RESPIRATORY FAILURE FLUID OVERLOAD IN PT WITH CHF / AFIB / + TROPONIN ACUTE KIDNEY INJURY    PLAN Antibiotics restarted for empirics therapy for fevers however, underlying process is most likely related to cardiogenic pulmonary edema and CHF with non-STEMI Cardiology/Nephrology following. Rate control as per cards. Glycemic control. Tylenol prn. DVT/GI prophylaxis. Patient is now DNR. No family at bedside.  SUBJECTIVE:  Patient seen and examined at bedside.   Patient remains critically ill Patient on full vent support Patient not able to wean from vent today Patient with vent dysynchony        VITAL SIGNS: Temp:  [98.1 F (36.7 C)-103.6 F (39.8 C)] 99.3 F (37.4 C) (09/09 0800) Pulse Rate:  [70-113] 70 (09/09 0800) Resp:  [12-25] 13 (09/09 0800) BP: (90-163)/(47-87) 125/63 (09/09 0800) SpO2:  [82 %-99 %] 97 % (09/09 0800) FiO2 (%):  [40 %-100 %] 80 % (09/09 0800) Weight:  [281 lb 1.4 oz (127.5 kg)] 281 lb 1.4 oz (127.5 kg) (09/09 0446)   Intake/Output Summary (Last 24 hours) at 12/24/16 0830 Last data filed at 12/24/16 0800  Gross per 24 hour  Intake           3475.9 ml  Output             2340 ml  Net           1135.9 ml     PHYSICAL  EXAMINATION: Physical Examination:   VS: BP 125/63   Pulse 70   Temp 99.3 F (37.4 C) (Core (Comment)) Comment (Src): foley  Resp 13   Ht 6\' 2"  (1.88 m)   Wt 281 lb 1.4 oz (127.5 kg)   SpO2 97%   BMI 36.09 kg/m    General Appearance: intubated Neuro: sedated but opens eyes to verbal stimuli and moves all extremities HEENT: ETT + Pulmonary: decreased air entry b/l, no wheezing heard Cardiovascular: Normal S1,S2.  No m/r/g.    Abdomen: Benign, Soft, non-tender, No masses Skin:   warm, no rashes, no ecchymosis  Extremities: normal, no cyanosis, clubbing    LABORATORY PANEL:   CBC  Recent Labs Lab 12/23/16 0439  WBC 18.7*  HGB 8.8*  HCT 27.0*  PLT 508*    Chemistries   Recent Labs Lab 12/24/16 0225  NA 142  K 4.4  CL 99*  CO2 32  GLUCOSE 175*  BUN 92*  CREATININE 1.66*  CALCIUM 8.6*  MG 2.5*  PHOS 4.0     Recent Labs Lab 12/24/16 0401 12/24/16 0501 12/24/16 0558 12/24/16 0704 12/24/16 0717 12/24/16 0811  GLUCAP 141* 117* 100* 102* 114* 105*    Recent Labs Lab 12/21/16 2122 12/22/16 0211 12/24/16 0216  PHART 7.32* 7.34* 7.30*  PCO2ART 68* 64* 73*  PO2ART 82* 113* 69*    Recent Labs Lab 12/20/16 0417 12/23/16  9914  ALBUMIN 2.5* 2.3*    Cardiac Enzymes No results for input(s): TROPONINI in the last 168 hours.  Critical Care Time devoted to patient care services described in this note is 32 minutes.   Overall, patient is critically ill, prognosis is guarded.  Patient with Multiorgan failure and at high risk for cardiac arrest and death.    Corrin Parker, M.D.  [facility]  Medical Director [place] Director [facility] Cardio-Pulmonary Department

## 2016-12-24 NOTE — Progress Notes (Signed)
Pt did better once restarted on Propofol gtt. Continue on Fentanyl as well. More sedated so not as awakening to voice as earlier in the shift. Scrotal edema and lower extremity edema worsened. Output slowed down despite 40 Mg of Lasix

## 2016-12-25 ENCOUNTER — Ambulatory Visit: Payer: Self-pay | Admitting: Psychiatry

## 2016-12-25 DIAGNOSIS — N179 Acute kidney failure, unspecified: Secondary | ICD-10-CM

## 2016-12-25 DIAGNOSIS — L8990 Pressure ulcer of unspecified site, unspecified stage: Secondary | ICD-10-CM | POA: Insufficient documentation

## 2016-12-25 DIAGNOSIS — J810 Acute pulmonary edema: Secondary | ICD-10-CM

## 2016-12-25 LAB — GLUCOSE, CAPILLARY
GLUCOSE-CAPILLARY: 256 mg/dL — AB (ref 65–99)
GLUCOSE-CAPILLARY: 255 mg/dL — AB (ref 65–99)
GLUCOSE-CAPILLARY: 193 mg/dL — AB (ref 65–99)
GLUCOSE-CAPILLARY: 166 mg/dL — AB (ref 65–99)
Glucose-Capillary: 163 mg/dL — ABNORMAL HIGH (ref 65–99)
Glucose-Capillary: 172 mg/dL — ABNORMAL HIGH (ref 65–99)
Glucose-Capillary: 173 mg/dL — ABNORMAL HIGH (ref 65–99)
Glucose-Capillary: 176 mg/dL — ABNORMAL HIGH (ref 65–99)
Glucose-Capillary: 186 mg/dL — ABNORMAL HIGH (ref 65–99)
Glucose-Capillary: 204 mg/dL — ABNORMAL HIGH (ref 65–99)
Glucose-Capillary: 205 mg/dL — ABNORMAL HIGH (ref 65–99)
Glucose-Capillary: 227 mg/dL — ABNORMAL HIGH (ref 65–99)
Glucose-Capillary: 243 mg/dL — ABNORMAL HIGH (ref 65–99)
Glucose-Capillary: 256 mg/dL — ABNORMAL HIGH (ref 65–99)
Glucose-Capillary: 256 mg/dL — ABNORMAL HIGH (ref 65–99)
Glucose-Capillary: 275 mg/dL — ABNORMAL HIGH (ref 65–99)

## 2016-12-25 LAB — BASIC METABOLIC PANEL
Anion gap: 11 (ref 5–15)
BUN: 126 mg/dL — AB (ref 6–20)
CALCIUM: 8.7 mg/dL — AB (ref 8.9–10.3)
CHLORIDE: 97 mmol/L — AB (ref 101–111)
CO2: 33 mmol/L — AB (ref 22–32)
CREATININE: 2.24 mg/dL — AB (ref 0.61–1.24)
GFR calc non Af Amer: 29 mL/min — ABNORMAL LOW (ref >60)
GFR, EST AFRICAN AMERICAN: 34 mL/min — AB (ref >60)
GLUCOSE: 263 mg/dL — AB (ref 65–99)
Potassium: 4.5 mmol/L (ref 3.5–5.1)
Sodium: 141 mmol/L (ref 135–145)

## 2016-12-25 LAB — MAGNESIUM: Magnesium: 3 mg/dL — ABNORMAL HIGH (ref 1.7–2.4)

## 2016-12-25 LAB — BRAIN NATRIURETIC PEPTIDE: B NATRIURETIC PEPTIDE 5: 2223 pg/mL — AB (ref 0.0–100.0)

## 2016-12-25 LAB — TRIGLYCERIDES: Triglycerides: 204 mg/dL — ABNORMAL HIGH (ref <150)

## 2016-12-25 MED ORDER — VALPROATE SODIUM 250 MG/5ML PO SOLN
250.0000 mg | Freq: Three times a day (TID) | ORAL | Status: DC
  Administered 2016-12-25 (×2): 250 mg
  Filled 2016-12-25 (×4): qty 5

## 2016-12-25 MED ORDER — AMIODARONE HCL IN DEXTROSE 360-4.14 MG/200ML-% IV SOLN
30.0000 mg/h | INTRAVENOUS | Status: DC
  Administered 2016-12-25 (×3): 30 mg/h via INTRAVENOUS
  Filled 2016-12-25 (×2): qty 200

## 2016-12-25 MED ORDER — INSULIN ASPART 100 UNIT/ML ~~LOC~~ SOLN
0.0000 [IU] | SUBCUTANEOUS | Status: DC
  Administered 2016-12-25: 7 [IU] via SUBCUTANEOUS
  Administered 2016-12-25: 4 [IU] via SUBCUTANEOUS
  Administered 2016-12-25: 7 [IU] via SUBCUTANEOUS
  Administered 2016-12-26: 4 [IU] via SUBCUTANEOUS
  Filled 2016-12-25 (×4): qty 1

## 2016-12-25 MED ORDER — AMIODARONE HCL IN DEXTROSE 360-4.14 MG/200ML-% IV SOLN: 30.0000 mg/h | INTRAVENOUS | Status: DC

## 2016-12-25 MED ORDER — IPRATROPIUM-ALBUTEROL 0.5-2.5 (3) MG/3ML IN SOLN: 3.0000 mL | RESPIRATORY_TRACT | Status: DC | PRN

## 2016-12-25 MED ORDER — ACETAMINOPHEN 650 MG RE SUPP
650.0000 mg | Freq: Once | RECTAL | Status: AC
Start: 2016-12-25 — End: 2016-12-25
  Administered 2016-12-25: 650 mg via RECTAL
  Filled 2016-12-25: qty 1

## 2016-12-25 MED ORDER — AMIODARONE HCL IN DEXTROSE 360-4.14 MG/200ML-% IV SOLN
60.0000 mg/h | INTRAVENOUS | Status: DC
  Administered 2016-12-25: 60 mg/h via INTRAVENOUS
  Filled 2016-12-25: qty 200

## 2016-12-25 MED ORDER — NOREPINEPHRINE BITARTRATE 1 MG/ML IV SOLN
0.0000 ug/min | INTRAVENOUS | Status: DC
  Administered 2016-12-25: 2 ug/min via INTRAVENOUS
  Filled 2016-12-25 (×2): qty 4

## 2016-12-25 MED ORDER — INSULIN GLARGINE 100 UNIT/ML ~~LOC~~ SOLN
40.0000 [IU] | Freq: Every day | SUBCUTANEOUS | Status: DC
  Administered 2016-12-25: 40 [IU] via SUBCUTANEOUS
  Filled 2016-12-25 (×2): qty 0.4

## 2016-12-25 MED ORDER — IPRATROPIUM-ALBUTEROL 0.5-2.5 (3) MG/3ML IN SOLN
3.0000 mL | Freq: Four times a day (QID) | RESPIRATORY_TRACT | Status: DC
  Administered 2016-12-25 – 2016-12-26 (×3): 3 mL via RESPIRATORY_TRACT
  Filled 2016-12-25 (×3): qty 3

## 2016-12-25 MED ORDER — FAMOTIDINE 20 MG PO TABS
20.0000 mg | ORAL_TABLET | Freq: Every day | ORAL | Status: DC
  Administered 2016-12-25: 20 mg
  Filled 2016-12-25: qty 1

## 2016-12-25 MED ORDER — INSULIN ASPART 100 UNIT/ML ~~LOC~~ SOLN
4.0000 [IU] | SUBCUTANEOUS | Status: DC
  Administered 2016-12-25 – 2016-12-26 (×3): 4 [IU] via SUBCUTANEOUS
  Filled 2016-12-25 (×3): qty 1

## 2016-12-25 NOTE — Progress Notes (Signed)
Intubated, increased ventilatory effort but no definite dyssynchrony, not F/C, sedated.  Vitals:   12/25/16 0900 12/25/16 1000 12/25/16 1100 12/25/16 1200  BP: (!) 165/92 137/81 (!) 108/53   Pulse: (!) 108 (!) 102 93   Resp: 13 16 12    Temp:  98 F (36.7 C)  98.9 F (37.2 C)  TempSrc:  Oral  Oral  SpO2: 93% 93% 95%   Weight:      Height:        Increased effort to trigger ventilator Sedated, not F/C HEENT WNL JVP cannot be assessed No wheezes, scattered bilateral rhonchi IR IR, no M noted NABS, NT, soft No LE edema MAEs  BMP Latest Ref Rng & Units 12/25/2016 12/24/2016 12/23/2016  Glucose 65 - 99 mg/dL 263(H) 175(H) 242(H)  BUN 6 - 20 mg/dL 126(H) 92(H) 89(H)  Creatinine 0.61 - 1.24 mg/dL 2.24(H) 1.66(H) 1.77(H)  Sodium 135 - 145 mmol/L 141 142 142  Potassium 3.5 - 5.1 mmol/L 4.5 4.4 4.8  Chloride 101 - 111 mmol/L 97(L) 99(L) 99(L)  CO2 22 - 32 mmol/L 33(H) 32 32  Calcium 8.9 - 10.3 mg/dL 8.7(L) 8.6(L) 9.0   CBC Latest Ref Rng & Units 12/23/2016 12/22/2016 12/17/2016  WBC 3.8 - 10.6 K/uL 18.7(H) 19.6(H) 12.2(H)  Hemoglobin 13.0 - 18.0 g/dL 8.8(L) 9.0(L) 9.7(L)  Hematocrit 40.0 - 52.0 % 27.0(L) 27.4(L) 30.1(L)  Platelets 150 - 440 K/uL 508(H) 539(H) 375   CXR: diffuse bilateral R >L airspace disease   IMPRESSION: VDRF Recurrent pulmonary edema Possible pneumonia -  New onset atrial fibrillation with RVR Dilated CM - LVEF 45-50% AKI/CKD DM 2 Acute encephalopathy  PLAN/REC: Cont full vent support - settings reviewed and/or adjusted Cont vent bundle Daily SBT if/when meets criteria Per RN, family plans on terminal extubation 09/11 Continue current hemodynamic support and amiodarone Continue current antibiotics No longer a candidate for renal replacement therapy Continue sedation protocol Transition from insulin infusion to SSI  CCM time: 35 mins  The above time includes time spent in consultation with patient and/or family members and reviewing care plan on  multidisciplinary rounds  Merton Border, MD PCCM service Mobile [phone] Pager [phone] 12/25/2016 1:13 PM

## 2016-12-25 NOTE — Consult Note (Signed)
[facility] NOTE  Pharmacy Consult for electrolytes/constipation prevention    No Known Allergies  Patient Measurements: Height: 6' 2"  (188 cm) Weight: 286 lb 13.1 oz (130.1 kg) IBW/kg (Calculated) : 82.2  Vital Signs: Temp: 98 F (36.7 C) (09/10 1000) Temp Source: Oral (09/10 1000) BP: 108/53 (09/10 1100) Pulse Rate: 93 (09/10 1100) Intake/Output from previous day: 09/09 0701 - 09/10 0700 In: 2859.4 [I.V.:1789.4; NG/GT:1070] Out: 2120 [Urine:2120] Intake/Output from this shift: Total I/O In: 271.7 [I.V.:81.7; NG/GT:40; IV Piggyback:150] Out: 1010 [Urine:1010] Vent settings for last 24 hours: Vent Mode: PCV FiO2 (%):  [70 %-100 %] 70 % Set Rate:  [10 bmp-24 bmp] 10 bmp Vt Set:  [500 mL] 500 mL PEEP:  [10 cmH20-15 cmH20] 10 cmH20 Plateau Pressure:  [30 cmH20] 30 cmH20  Labs:  Recent Labs  12/22/16 2312 12/23/16 0439 12/24/16 0225 12/24/16 0503 12/25/16 0421  WBC 19.6* 18.7*  --   --   --   HGB 9.0* 8.8*  --   --   --   HCT 27.4* 27.0*  --   --   --   PLT 539* 508*  --   --   --   APTT  --   --   --  42*  --   INR  --   --   --  1.31  --   CREATININE 1.84* 1.77* 1.66*  --  2.24*  MG 2.5* 2.4 2.5*  --  3.0*  PHOS 3.7 4.4  4.5 4.0  --   --   ALBUMIN  --  2.3*  --   --   --    Estimated Creatinine Clearance: 47.8 mL/min (A) (by C-G formula based on SCr of 2.24 mg/dL (H)).   Recent Labs  12/25/16 0904 12/25/16 1002 12/25/16 1055  GLUCAP 172* 163* 166*     Medications:  Scheduled:  . chlorhexidine gluconate (MEDLINE KIT)  15 mL Mouth Rinse BID  . clopidogrel  75 mg Per NG tube Daily  . docusate  200 mg Per Tube BID  . enoxaparin (LOVENOX) injection  1 mg/kg Subcutaneous Q12H  . famotidine  20 mg Per Tube Daily  . feeding supplement (PRO-STAT SUGAR FREE 64)  60 mL Per Tube TID  . furosemide  20 mg Intravenous Q12H  . insulin aspart  0-20 Units Subcutaneous Q4H  . insulin aspart  4 Units Subcutaneous Q4H  . insulin glargine  40  Units Subcutaneous Daily  . ipratropium-albuterol  3 mL Nebulization Q6H  . levothyroxine  50 mcg Per NG tube QAC breakfast  . mouth rinse  15 mL Mouth Rinse 10 times per day  . metoprolol tartrate  25 mg Per NG tube BID  . rosuvastatin  20 mg Per Tube QHS  . sennosides  10 mL Per Tube BID  . sodium chloride flush  10-40 mL Intracatheter Q12H  . Valproate Sodium  250 mg Per Tube Q8H    Assessment: Pharmacy consulted for electrolyte and constipation management for 66 yo male ICU patient admitted with respiratory distress. Patient is currently sedated on fentanyl and propofol.   Plan:  1. Electrolytes: no replacement warranted at this time. Will follow up with am labs.   2. Constipation: continue docusate 22m VT BID and senna 8.846mVT BID.   Pharmacy will continue to monitor and adjust per consult.   MeDurwin RegesStudent-PharmD 12/25/2016, 8:50AM

## 2016-12-25 NOTE — Progress Notes (Signed)
66 yo male with recent NSTEMI, admitted with respiratory failure and currently vented.  Renal function slightly decreased.    PE Well developed gentleman, rrr, vented, eyes open but not seeing.   Resp - breaths appear agonal even on the vent Abdomen soft nt LE:  LLE cool with mottling, RLE slightly warmer   Spoke with son Larkin Ina) and ex wife at bedside.  Larkin Ina explains his father did not want to be intubated, resuscitated, or ever be placed in a SNF.    Larkin Ina understands his father is dying.  We discussed 1 way extubation being planned for tomorrow.  Justin inclined to start full comfort measures prior to extubation so that there is no opportunity for his father to struggle.  I fully support that inclination.  Larkin Ina states family is preparing to gather for extubation tomorrow.  Provided emotional support and listening.  Florentina Jenny, PA-C Palliative Medicine Pager: [phone]   Time 25 min.

## 2016-12-25 NOTE — Progress Notes (Signed)
Pharmacy Antibiotic Note  [facility] Mullen is a 66 y.o. male admitted on 12/25/2016 with sepsis.  Pharmacy has been consulted for cefepime dosing.  Plan: Continue cefepime 2 grams q 12 hours  Height: 6\' 2"  (188 cm) Weight: 286 lb 13.1 oz (130.1 kg) IBW/kg (Calculated) : 82.2  Temp (24hrs), Avg:99 F (37.2 C), Min:95.2 F (35.1 C), Max:102.3 F (39.1 C)   Recent Labs Lab 12/22/16 0502 12/22/16 2312 12/23/16 0439 12/24/16 0225 12/25/16 0421  WBC  --  19.6* 18.7*  --   --   CREATININE 2.04* 1.84* 1.77* 1.66* 2.24*    Estimated Creatinine Clearance: 47.8 mL/min (A) (by C-G formula based on SCr of 2.24 mg/dL (H)).    No Known Allergies  Antimicrobials this admission: 9/1 Vancomycin >> 9/3 9/1 Zosyn>> 9/4 9/7 Cefepime >>   Dose adjustments this admission: N/A  Microbiology results: 9/7 BCx: no growth 3 days 9/7 UCx: no growth   9/7 Sputum: no organisms seen, consistent with normal resp flora  8/23 MRSA PCR: (-)   Thank you for allowing pharmacy to be a part of this patient's care.  Durwin Reges, PharmD Student 12/25/2016 12:23 PM

## 2016-12-25 NOTE — Progress Notes (Signed)
Patient appears to have worsened during shift. O2 sats decreased during end of shift requiring increase in FiO2 by RT. Family at bedside and planning terminal extubation to comfort care tomorrow. Family sad but realistic and speak of goal to keep patient comfortable tomorrow. Patient unable to meet RASS score during shift. Patient would become alert with no sedation but breathing effort significantly increased and would eventually become agitated. While patient opened eyes he would not track RN or family. Lower extremities became more dusky and mottled during shift. Patient transitioned off insulin infusion per MD orders.

## 2016-12-25 NOTE — Progress Notes (Signed)
[facility]  ROUNDING NOTE   Subjective:   Patient remains critically ill. Cr increased to 2.24, BUN increased to 126  UOP 2120 last 24 hrs Remains vent dependent Fio2 70%, PEEP 10   Objective:  Vital signs in last 24 hours:  Temp:  [95.2 F (35.1 C)-102.3 F (39.1 C)] 97.6 F (36.4 C) (09/10 0730) Pulse Rate:  [66-102] 102 (09/10 0830) Resp:  [8-29] 29 (09/10 0830) BP: (104-153)/(57-75) 107/62 (09/10 0800) SpO2:  [89 %-100 %] 100 % (09/10 0830) FiO2 (%):  [80 %-100 %] 100 % (09/10 0830) Weight:  [130.1 kg (286 lb 13.1 oz)] 130.1 kg (286 lb 13.1 oz) (09/10 0426)  Weight change: 2.6 kg (5 lb 11.7 oz) Filed Weights   12/23/16 0438 12/24/16 0446 12/25/16 0426  Weight: 125.2 kg (276 lb 0.3 oz) 127.5 kg (281 lb 1.4 oz) 130.1 kg (286 lb 13.1 oz)    Intake/Output: I/O last 3 completed shifts: In: 4848.8 [I.V.:2983.8; NG/GT:1550; IV Piggyback:315] Out: 1610 [Urine:3220]   Intake/Output this shift:  Total I/O In: 121.7 [I.V.:81.7; NG/GT:40] Out: 110 [Urine:110]  Physical Exam: General: Critically ill    Head: +ETT, +OGT  Eyes: Conjunctival edema  Neck: RIJ central line, obese  Lungs:  Vent assisted, left diffuse crackles  Heart: irregular , tachycardic  Abdomen:  Soft, nontender, obese  Extremities: + dependent edema  Neurologic: Intubated, sedated           Basic Metabolic Panel:  Recent Labs Lab 12/20/16 0417  12/22/16 0502 12/22/16 2312 12/23/16 0439 12/24/16 0225 12/25/16 0421  NA 140  < > 141 142 142 142 141  K 4.1  < > 4.4 4.1 4.8 4.4 4.5  CL 100*  < > 98* 99* 99* 99* 97*  CO2 31  < > 31 32 32 32 33*  GLUCOSE 219*  < > 249* 182* 242* 175* 263*  BUN 41*  < > 62* 80* 89* 92* 126*  CREATININE 1.44*  < > 2.04* 1.84* 1.77* 1.66* 2.24*  CALCIUM 9.0  < > 9.1 9.2 9.0 8.6* 8.7*  MG  --   --   --  2.5* 2.4 2.5* 3.0*  PHOS 3.8  --   --  3.7 4.4  4.5 4.0  --   < > = values in this interval not displayed.  Liver Function Tests:  Recent  Labs Lab 12/20/16 0417 12/23/16 0439  ALBUMIN 2.5* 2.3*   No results for input(s): LIPASE, AMYLASE in the last 168 hours. No results for input(s): AMMONIA in the last 168 hours.  CBC:  Recent Labs Lab 12/22/16 2312 12/23/16 0439  WBC 19.6* 18.7*  HGB 9.0* 8.8*  HCT 27.4* 27.0*  MCV 84.1 87.2  PLT 539* 508*    Cardiac Enzymes: No results for input(s): CKTOTAL, CKMB, CKMBINDEX, TROPONINI in the last 168 hours.  BNP: Invalid input(s): POCBNP  CBG:  Recent Labs Lab 12/25/16 0407 12/25/16 0504 12/25/16 0609 12/25/16 0716 12/25/16 0813  GLUCAP 275* 256* 255* 204* 193*    Microbiology: Results for orders placed or performed during the hospital encounter of 01/05/2017  Blood culture (routine x 2)     Status: None   Collection Time: 01/01/2017  7:24 AM  Result Value Ref Range Status   Specimen Description BLOOD RIGHT ANTECUBITAL  Final   Special Requests   Final    BOTTLES DRAWN AEROBIC AND ANAEROBIC Blood Culture adequate volume   Culture NO GROWTH 5 DAYS  Final   Report Status 12/21/2016 FINAL  Final  Blood culture (routine x 2)     Status: None   Collection Time: 01/12/2017  7:29 AM  Result Value Ref Range Status   Specimen Description BLOOD LEFT ANTECUBITAL  Final   Special Requests   Final    BOTTLES DRAWN AEROBIC AND ANAEROBIC Blood Culture adequate volume   Culture NO GROWTH 5 DAYS  Final   Report Status 12/21/2016 FINAL  Final  Culture, respiratory (NON-Expectorated)     Status: None   Collection Time: 12/18/16 10:00 AM  Result Value Ref Range Status   Specimen Description TRACHEAL ASPIRATE  Final   Special Requests NONE  Final   Gram Stain   Final    ABUNDANT WBC PRESENT,BOTH PMN AND MONONUCLEAR NO ORGANISMS SEEN    Culture   Final    NO GROWTH 2 DAYS Performed at [facility], [address], [place], [place] [address]    Report Status 12/21/2016 FINAL  Final  CULTURE, BLOOD (ROUTINE X 2) w Reflex to ID Panel     Status: None (Preliminary  result)   Collection Time: 12/22/16 11:11 AM  Result Value Ref Range Status   Specimen Description BLOOD BLOOD RIGHT HAND  Final   Special Requests   Final    BOTTLES DRAWN AEROBIC AND ANAEROBIC Blood Culture adequate volume   Culture NO GROWTH 3 DAYS  Final   Report Status PENDING  Incomplete  CULTURE, BLOOD (ROUTINE X 2) w Reflex to ID Panel     Status: None (Preliminary result)   Collection Time: 12/22/16 11:31 AM  Result Value Ref Range Status   Specimen Description BLOOD BLOOD LEFT HAND  Final   Special Requests   Final    BOTTLES DRAWN AEROBIC AND ANAEROBIC Blood Culture adequate volume   Culture NO GROWTH 3 DAYS  Final   Report Status PENDING  Incomplete  Culture, respiratory (NON-Expectorated)     Status: None   Collection Time: 12/22/16 11:50 AM  Result Value Ref Range Status   Specimen Description TRACHEAL ASPIRATE  Final   Special Requests NONE  Final   Gram Stain   Final    MODERATE WBC PRESENT,BOTH PMN AND MONONUCLEAR NO ORGANISMS SEEN    Culture   Final    Consistent with normal respiratory flora. Performed at [facility], [place] [address] [place], [place] [address]    Report Status 12/24/2016 FINAL  Final  Urine Culture     Status: None   Collection Time: 12/22/16  4:28 PM  Result Value Ref Range Status   Specimen Description URINE, RANDOM  Final   Special Requests NONE  Final   Culture   Final    NO GROWTH Performed at [facility], [place] [address], [place], [place] [address]    Report Status 12/24/2016 FINAL  Final    Coagulation Studies:  Recent Labs  12/24/16 0503  LABPROT 16.2*  INR 1.31    Urinalysis: No results for input(s): COLORURINE, LABSPEC, PHURINE, GLUCOSEU, HGBUR, BILIRUBINUR, KETONESUR, PROTEINUR, UROBILINOGEN, NITRITE, LEUKOCYTESUR in the last 72 hours.  Invalid input(s): APPERANCEUR    Imaging: Dg Chest Port 1 View  Result Date: 12/24/2016 CLINICAL DATA:  Acute respiratory failure EXAM: PORTABLE CHEST 1 VIEW  COMPARISON:  12/22/2016 FINDINGS: Endotracheal tube with tip measuring 5.6 cm above the carina. Right central venous catheter with tip over the upper SVC region. Enteric tube tip is not visualized below the diaphragms. This is likely due to technique. Cardiac enlargement with pulmonary vascular congestion. Bilateral  perihilar airspace and interstitial infiltrates. This likely represents edema but could indicate aspiration, pneumonia, or ARDS in the appropriate setting. No pneumothorax. Mild blunting of costophrenic angles suggesting small effusions. IMPRESSION: Appliances appear in satisfactory position. Cardiac enlargement with pulmonary vascular congestion and perihilar infiltrates consistent with edema, aspiration, pneumonia, or ARDS, depending on the clinical setting. Small bilateral pleural effusions. Electronically Signed   By: Lucienne Capers M.D.   On: 12/24/2016 02:33     Medications:   . amiodarone 30 mg/hr (12/25/16 0908)  . ceFEPime (MAXIPIME) IV Stopped (12/24/16 2246)  . feeding supplement (VITAL HIGH PROTEIN) 1,000 mL (12/25/16 0700)  . fentaNYL infusion INTRAVENOUS Stopped (12/25/16 0830)  . insulin (NOVOLIN-R) infusion 11.2 Units/hr (12/25/16 0910)  . propofol (DIPRIVAN) infusion Stopped (12/25/16 0816)   . chlorhexidine gluconate (MEDLINE KIT)  15 mL Mouth Rinse BID  . clopidogrel  75 mg Per NG tube Daily  . docusate  200 mg Per Tube BID  . enoxaparin (LOVENOX) injection  1 mg/kg Subcutaneous Q12H  . famotidine  20 mg Per Tube Daily  . feeding supplement (PRO-STAT SUGAR FREE 64)  60 mL Per Tube TID  . furosemide  20 mg Intravenous Q12H  . ipratropium-albuterol  3 mL Nebulization Q6H  . levothyroxine  50 mcg Per NG tube QAC breakfast  . mouth rinse  15 mL Mouth Rinse 10 times per day  . metoprolol tartrate  25 mg Per NG tube BID  . rosuvastatin  20 mg Per Tube QHS  . sennosides  10 mL Per Tube BID  . sodium chloride flush  10-40 mL Intracatheter Q12H  . Valproate Sodium   250 mg Per Tube Q8H   acetaminophen, bisacodyl, docusate sodium, fentaNYL, ipratropium-albuterol, midazolam, sodium chloride flush  Assessment/ Plan:  66 y.o. white male with anxiety, bipolar disorder, BPH, coronary artery disease, depression, diabetes mellitus type 2 insulin dependent, erectile dysfunction, GERD, gout, hypertension, hypothyroidism, peripheral neuropathy, obesity, chronic kidney disease stage III, peripheral vascular disease who was admitted to [facility] on 12/31/2016 for evaluation of chest pain. Upon evaluation the patient was found to be in atrial fibrillation versus flutter. Patient with acute respiratory failure   1. Acute renal failure on chronic kidney disease stage III Baseline creatinine 1-1.3 Acute renal failure secondary to acute respiratory failure, atrial fibrillation with RVR and aggressive diuretics. Chronic kidney disease secondary to diabetes, hypertension and vascular disease Not on ACE-I/ARB as outpatient.  - Continue furosemide 77m IV daily and monitor volume status and renal function.  No acute indication for dialysis at this time.  Good UOP of over 2 L Azotemia due to high dose protein supplementation and iv solumedrol  2. Acute respiratory failure and acute on chronic systolic heart failure.  On mechanical ventilation.   3.  Diabetes mellitus type II with chronic kidney disease: insulin dependent.  - holding metformin     LOS: 9 Shanedra Lave 9/10/20189:10 AM

## 2016-12-25 NOTE — Progress Notes (Signed)
[place] [facility] [place] NAME: Dennis Mullen    MR#:  [id number]  DATE OF BIRTH:  21-Dec-1950  SUBJECTIVE:  CHIEF COMPLAINT:   Chief Complaint  Patient presents with  . Respiratory Distress   The patient is on vent (O2 70%, PEEP 10) and sedation, on amiodarone drip. REVIEW OF SYSTEMS:  Intubated, so can not give much details, sedated. ROS  DRUG ALLERGIES:  No Known Allergies  VITALS:  Blood pressure (!) 108/53, pulse 93, temperature 99.3 F (37.4 C), temperature source Oral, resp. rate 12, height 6\' 2"  (1.88 m), weight 286 lb 13.1 oz (130.1 kg), SpO2 90 %.  PHYSICAL EXAMINATION:    GENERAL:  66 y.o.-year-old patient lying in the bed with acute distress, critical appearing. Obesity. EYES: Pupils equal, round, reactive to light. No scleral icterus. Extraocular muscles did not check, pt is sedated.  HEENT: Head atraumatic, normocephalic. Oropharynx and nasopharynx clear.  NECK:  Supple, no jugular venous distention. No thyroid enlargement, no tenderness.  LUNGS: Normal breath sounds bilaterally, no wheezing, b/l crepitation. On vent support via ET tube. CARDIOVASCULAR: S1, S2 normal. No murmurs, rubs, or gallops.  ABDOMEN: Soft, nontender, nondistended. Bowel sounds present. No organomegaly or mass.  EXTREMITIES: No pedal edema, cyanosis, or clubbing.  NEUROLOGIC: pt is on vent support, sedated. PSYCHIATRIC: sedated on vent.  SKIN: No obvious rash, lesion, or ulcer.   Physical Exam LABORATORY PANEL:   CBC  Recent Labs Lab 12/23/16 0439  WBC 18.7*  HGB 8.8*  HCT 27.0*  PLT 508*   ------------------------------------------------------------------------------------------------------------------  Chemistries   Recent Labs Lab 12/25/16 0421  NA 141  K 4.5  CL 97*  CO2 33*  GLUCOSE 263*  BUN 126*  CREATININE 2.24*  CALCIUM 8.7*  MG 3.0*    ------------------------------------------------------------------------------------------------------------------  Cardiac Enzymes No results for input(s): TROPONINI in the last 168 hours. ------------------------------------------------------------------------------------------------------------------  RADIOLOGY:  Dg Chest Port 1 View  Result Date: 12/24/2016 CLINICAL DATA:  Acute respiratory failure EXAM: PORTABLE CHEST 1 VIEW COMPARISON:  12/22/2016 FINDINGS: Endotracheal tube with tip measuring 5.6 cm above the carina. Right central venous catheter with tip over the upper SVC region. Enteric tube tip is not visualized below the diaphragms. This is likely due to technique. Cardiac enlargement with pulmonary vascular congestion. Bilateral perihilar airspace and interstitial infiltrates. This likely represents edema but could indicate aspiration, pneumonia, or ARDS in the appropriate setting. No pneumothorax. Mild blunting of costophrenic angles suggesting small effusions. IMPRESSION: Appliances appear in satisfactory position. Cardiac enlargement with pulmonary vascular congestion and perihilar infiltrates consistent with edema, aspiration, pneumonia, or ARDS, depending on the clinical setting. Small bilateral pleural effusions. Electronically Signed   By: Lucienne Capers M.D.   On: 12/24/2016 02:33    ASSESSMENT AND PLAN:     Came with respi distress and intubated, have CHF, renal failure, acidosis.    Remains on vent.   Failed weaning trials.   After discussion with palliative care and ICU team, family agreed on DNR. Want to wait for 1-2 days to check for response.  Active Problems:   Acute respiratory failure with hypoxia (HCC)   Acute on chronic systolic CHF (congestive heart failure) (HCC)   Acute respiratory failure (HCC)   Acute renal failure superimposed on stage 3 chronic kidney disease (HCC)   Goals of care, counseling/discussion   Palliative care encounter   Ventilator  dependence ([place])   Pressure injury of skin  * Ac respi failure hypoxic   Ac on ch  systolic CHF   HCA pneumonia- due to repeated admissions- suspected initially, but ruled out , intencivist stopped Abx.   S/p intubation   Intencivist and cardio consult.   Difficult extubation and poor prognosis.   continue IV lasix, I/o monitor, renal func monitor.   vanc + zosyn were stopped, on cefepime.    Very high risk for recurrent symptoms and reintubation, failed trials, poor prognosis.  *  DM, better controlled    Removed his Insuline delivery device  on insulin drip.  * lactic acidosis    Likely due to CHF    Cont to monitor with vent support.   Improved.  * Uncontrolled Htn   BP was 888 systolic, ER started on nitro drip    Had drop in BP, so stopped. Now stable.  * hypothyroidism     Cont TSH.  * Elevated troponin   Likely coming down from NSTEMI last week.  Per cardiologist, no further intervention.  * Ac renal failure on CKD stage 3   Monitor closely with lasix use.   Appreciated help by nephrology.    Slightly improved, resumed lasix.   * CAD   Cont asa, plavix, metoprolol, rosuvastatin  *  A fib   COnt metoprolol and amiodarone.   Eliquis changed to lovenox  for now.    On amio drip now.  Very poor prognosis.  Per Dr. Alva Garnet, plan to extubate tomorrow. All the records are reviewed and case discussed with Care Management/Social Workerr. Management plans discussed with the patient's son,  and they are in agreement.  CODE STATUS: DNR.  TOTAL TIME TAKING CARE OF THIS PATIENT: 25 minutes.  POSSIBLE D/C IN ? DAYS, DEPENDING ON CLINICAL CONDITION.   Demetrios Loll M.D on 12/25/2016   Between 7am to 6pm - Pager - [phone]  After 6pm go to [email or web address] - password [email or web address] [facility]  Office  [phone]  CC: Primary care physician; Lavera Guise, MD  Note: This dictation was prepared with Dragon dictation along with smaller  phrase technology. Any transcriptional errors that result from this process are unintentional.

## 2016-12-26 ENCOUNTER — Other Ambulatory Visit: Payer: Self-pay | Admitting: *Deleted

## 2016-12-26 ENCOUNTER — Encounter: Payer: Self-pay | Admitting: *Deleted

## 2016-12-26 LAB — GLUCOSE, CAPILLARY: Glucose-Capillary: 199 mg/dL — ABNORMAL HIGH (ref 65–99)

## 2016-12-26 MED ORDER — NOREPINEPHRINE BITARTRATE 1 MG/ML IV SOLN
0.0000 ug/min | INTRAVENOUS | Status: DC
  Administered 2016-12-26: 8 ug/min via INTRAVENOUS
  Filled 2016-12-26: qty 16

## 2016-12-26 MED ORDER — VASOPRESSIN 20 UNIT/ML IV SOLN
0.0300 [IU]/min | INTRAVENOUS | Status: DC
  Administered 2016-12-26: 0.03 [IU]/min via INTRAVENOUS
  Filled 2016-12-26: qty 2

## 2016-12-27 ENCOUNTER — Telehealth: Payer: Self-pay

## 2016-12-27 LAB — CULTURE, BLOOD (ROUTINE X 2)
Culture: NO GROWTH
Culture: NO GROWTH
Special Requests: ADEQUATE
Special Requests: ADEQUATE

## 2016-12-27 NOTE — Telephone Encounter (Signed)
Death certificate given to DK for completion.

## 2016-12-27 NOTE — Telephone Encounter (Signed)
Funeral home informed death cert ready for pick up. Placed up front by Sonya C. Nothing further needed.

## 2016-12-27 NOTE — Telephone Encounter (Signed)
Received Death Certificate from Dennison Mascot in Nurse box please call when ready

## 2017-01-15 ENCOUNTER — Ambulatory Visit: Payer: Self-pay | Admitting: Family

## 2017-01-15 NOTE — Progress Notes (Signed)
CH responded to a PG for room IC-05. Pt had expired and the family was on the way. The family planned on coming later this morning for extubation. Todd spent some time with the son awaiting his mother and brother.The son stated that the family is exhausted as they have been with the Pt at [facility] for over a week. He felt that this passing was a gift from his father. On arrival, the family presented calm and spiritually strong. Seeing they were in a good space, CH excused himself.    24-Jan-2017 0300  Clinical Encounter Type  Visited With Family  Visit Type Follow-up;Critical Care;Death  Referral From Nurse  Spiritual Encounters  Spiritual Needs Prayer;Emotional;Grief support

## 2017-01-15 NOTE — Death Summary Note (Signed)
DEATH SUMMARY   Patient Details  Name: Dennis Mullen MRN: [id number] DOB: July 22, 1950  Admission/Discharge Information   Admit Date:  01-10-2017  Date of Death:  2017/01/20  Time of Death:  02:13 am  Length of Stay: 10  Referring Physician: Lavera Guise, MD   Reason(s) for Hospitalization  Acute respiratory failure secondary to CHF exacerbation   Diagnoses  Preliminary cause of death: Acute on chronic respiratory failure secondary to CHF exacerbation  Secondary Diagnoses (including complications and co-morbidities):  Active Problems:   Acute respiratory failure with hypoxia (HCC)   Acute on chronic systolic CHF (congestive heart failure) (HCC)   Acute respiratory failure (HCC)   Acute renal failure superimposed on stage 3 chronic kidney disease (HCC)   Goals of care, counseling/discussion   Palliative care encounter   Ventilator dependence ([place])   Pressure injury of skin   Brief Hospital Course (including significant findings, care, treatment, and services provided and events leading to death)  Dennis Mullen is a 66 y.o. year old male with a PMH of bipolar disorder, atrial fibrillation, coronary artery disease, congestive heart failure, NSTEMI, HTN, diabetes mellitus type 2, GERD, sleep apnea, peripheral vascular disease, BPH, and 4 admission d/t cardiac decline in the past month with VF arrest 12/08/16 with ROSC.  He was admitted on Jan 10, 2017 with acute on chronic hypoxic respiratory failure secondary to CHF exacerbation with OSA complicated by chronic kidney disease requiring mechanical intubation on admission 01-10-17.  Hospital course complicated by failure of multiple spontaneous weaning trials, therefore pts family planned to transition pt to comfort care measures only once all family arrived in town on 01/20/2017.  However, pt continued to decline and expired January 20, 2017 at 02:13 am prior to transition to comfort care measures only and pts family notified.    Pertinent Labs and  Studies  Significant Diagnostic Studies Dg Chest 1 View  Result Date: 12/21/2016 CLINICAL DATA:  Intubation. EXAM: CHEST 1 VIEW COMPARISON:  December 19, 2016 FINDINGS: The ETT is in good position. The NG tube is not well seen distally but terminates below the diaphragm. Significant improvement in bibasilar opacities. No other interval changes or acute abnormalities. IMPRESSION: 1. Improving opacities in the bases.  Support apparatus as above. Electronically Signed   By: Dorise Bullion III M.D   On: 12/21/2016 21:19   Dg Chest 2 View  Result Date: 12/07/2016 CLINICAL DATA:  Shortness of breath.  Chest pressure.  Dyspnea. EXAM: CHEST  2 VIEW COMPARISON:  Radiographs and CT 1 week prior 11/30/2016 FINDINGS: Unchanged heart size and mediastinal contours. Worsening pulmonary edema from prior exam. No confluent airspace disease. No pleural fluid. No pneumothorax. No acute osseous abnormality. IMPRESSION: Worsening pulmonary edema from prior exam. Electronically Signed   By: Jeb Levering M.D.   On: 12/07/2016 06:53   Dg Chest 2 View  Result Date: 11/28/2016 CLINICAL DATA:  Chest pain, shortness of breath. EXAM: CHEST  2 VIEW COMPARISON:  Radiographs of November 27, 2016. FINDINGS: The heart size and mediastinal contours are within normal limits. Both lungs are clear. No pneumothorax or pleural effusion is noted. The visualized skeletal structures are unremarkable. IMPRESSION: No active cardiopulmonary disease. Pulmonary edema noted on prior exam has resolved. Electronically Signed   By: Marijo Conception, M.D.   On: 11/28/2016 08:06   Dg Chest 2 View  Result Date: 11/27/2016 CLINICAL DATA:  Chest pain EXAM: CHEST  2 VIEW COMPARISON:  11/03/2016 FINDINGS: Borderline cardiomegaly. There is generalized Kerley lines and fissure thickening.  No effusion or pneumothorax. Negative aorta and hila. IMPRESSION: Pulmonary interstitial edema. Electronically Signed   By: Monte Fantasia M.D.   On: 11/27/2016 08:26    Dg Abd 1 View  Result Date: 12/18/2016 CLINICAL DATA:  OG tube EXAM: ABDOMEN - 1 VIEW COMPARISON:  01/12/2017 FINDINGS: OG tube within the distal stomach as before. Nonobstructive bowel gas pattern. Abdominal atherosclerosis noted. Dense left lower lobe collapse/ consolidation. Degenerative changes of the spine. IMPRESSION: OG tube within the distal stomach. No significant obstruction pattern or ileus Dense left lower lobe collapse/consolidation Electronically Signed   By: Jerilynn Mages.  Shick M.D.   On: 12/18/2016 09:16   Ct Angio Chest Pe W And/or Wo Contrast  Result Date: 11/30/2016 CLINICAL DATA:  Acute onset of shortness of breath and generalized chest pressure. Initial encounter. EXAM: CT ANGIOGRAPHY CHEST WITH CONTRAST TECHNIQUE: Multidetector CT imaging of the chest was performed using the standard protocol during bolus administration of intravenous contrast. Multiplanar CT image reconstructions and MIPs were obtained to evaluate the vascular anatomy. CONTRAST:  75 mL of Isovue 370 IV contrast COMPARISON:  Chest radiograph performed earlier today at 4:27 a.m., and CTA of the chest performed 10/10/2014 FINDINGS: Cardiovascular: There is no evidence of significant pulmonary embolus. Evaluation for pulmonary embolus is somewhat suboptimal due to motion artifact. The heart remains normal in size. Diffuse coronary artery calcifications are seen. Scattered calcification is seen along the aortic arch. The proximal great vessels are grossly unremarkable in appearance. Mediastinum/Nodes: Mildly enlarged right paratracheal and superior mediastinal nodes measure up to 1.2 cm in short axis. No pericardial effusion is identified. The thyroid gland is unremarkable. No axillary lymphadenopathy is seen. Lungs/Pleura: Diffuse interstitial prominence is noted, with hazy bilateral airspace opacities, compatible with pulmonary edema. No pleural effusion or pneumothorax is seen. No dominant mass is identified. Upper Abdomen: The  visualized portions of the liver and spleen are unremarkable, aside from an apparent calcified granuloma within the spleen. The visualized portions of the gallbladder, pancreas, adrenal glands and stomach are within normal limits. Musculoskeletal: No acute osseous abnormalities are identified. The visualized musculature is unremarkable in appearance. Review of the MIP images confirms the above findings. IMPRESSION: 1. No evidence of significant pulmonary embolus. 2. Diffuse interstitial prominence and hazy bilateral airspace opacities are compatible with pulmonary edema. 3. Diffuse coronary artery calcifications seen. 4. Mildly enlarged mediastinal nodes measure up to 1.2 cm in short axis. Electronically Signed   By: Garald Balding M.D.   On: 11/30/2016 06:19   [place] Retroperitoneal (renal,aorta,ivc Nodes)  Result Date: 01/12/2017 CLINICAL DATA:  Acute renal failure EXAM: RENAL / URINARY TRACT ULTRASOUND COMPLETE COMPARISON:  09/29/2013 FINDINGS: Right Kidney: Length: 11.1 cm. Echogenicity within normal limits. No mass or hydronephrosis visualized. Left Kidney: Length: 11.1 cm. Echogenicity within normal limits. No mass or hydronephrosis visualized. Bladder: Collapsed by Foley catheter.  Limited assessment. IMPRESSION: No acute finding by renal ultrasound.  Negative for hydronephrosis. Electronically Signed   By: Jerilynn Mages.  Shick M.D.   On: 12/17/2016 15:30   Dg Chest Port 1 View  Result Date: 12/24/2016 CLINICAL DATA:  Acute respiratory failure EXAM: PORTABLE CHEST 1 VIEW COMPARISON:  12/22/2016 FINDINGS: Endotracheal tube with tip measuring 5.6 cm above the carina. Right central venous catheter with tip over the upper SVC region. Enteric tube tip is not visualized below the diaphragms. This is likely due to technique. Cardiac enlargement with pulmonary vascular congestion. Bilateral perihilar airspace and interstitial infiltrates. This likely represents edema but could indicate aspiration, pneumonia, or ARDS in  the  appropriate setting. No pneumothorax. Mild blunting of costophrenic angles suggesting small effusions. IMPRESSION: Appliances appear in satisfactory position. Cardiac enlargement with pulmonary vascular congestion and perihilar infiltrates consistent with edema, aspiration, pneumonia, or ARDS, depending on the clinical setting. Small bilateral pleural effusions. Electronically Signed   By: Lucienne Capers M.D.   On: 12/24/2016 02:33   Dg Chest Port 1 View  Result Date: 12/22/2016 CLINICAL DATA:  Dyspnea. History of diabetes, gastroesophageal reflux disease, CHF, hypertension. Nonsmoker. EXAM: PORTABLE CHEST 1 VIEW COMPARISON:  12/21/2016 FINDINGS: Endotracheal tube with tip measuring 3.9 cm above the carina. Enteric tube tip is off the field of view but below the left hemidiaphragm. Right central venous catheter tip is over the upper SVC region. No pneumothorax. Cardiac enlargement. Atelectasis in the lung bases. No blunting of costophrenic angles. No pneumothorax. IMPRESSION: Appliances appear in satisfactory position. Cardiac enlargement. Atelectasis in the lung bases. No significant change since prior study. Electronically Signed   By: Lucienne Capers M.D.   On: 12/22/2016 04:40   Dg Chest Port 1 View  Result Date: 12/19/2016 CLINICAL DATA:  Acute respiratory failure EXAM: PORTABLE CHEST 1 VIEW COMPARISON:  12/18/2016 FINDINGS: Support devices are stable. Cardiomegaly with bilateral airspace opacities and layering effusions. Findings likely reflect edema/ CHF. Aeration has worsened since prior study. IMPRESSION: Worsening bilateral airspace disease, likely CHF. Layering bilateral effusions. Electronically Signed   By: Rolm Baptise M.D.   On: 12/19/2016 07:18   Dg Chest Port 1 View  Result Date: 12/18/2016 CLINICAL DATA:  66 year old male central line placement. Admitted with Respiratory distress, intubated. EXAM: PORTABLE CHEST 1 VIEW COMPARISON:  0859 hours today and earlier. FINDINGS: Portable AP  upright view at 1959 hours. Endotracheal tube tip is stable between the level the clavicles and carina. Right IJ approach small caliber triple lumen catheter has been placed. The catheter tip projects about 1 vertebral body below the carina. Enteric tube in place and courses to the abdomen, tip not included. No pneumothorax. More lordotic positioning. Stable to lower lung volumes. Confluent left lung base opacity obscuring the left hemidiaphragm. Stable cardiac size and mediastinal contours. No pulmonary edema. No other confluent opacity. IMPRESSION: 1. Right IJ approach central line placed, tip at the lower SVC level. 2.  Otherwise stable lines and tubes. 3. Continued low lung volumes with left lung base collapse or consolidation. Electronically Signed   By: Genevie Ann M.D.   On: 12/18/2016 20:18   Dg Chest Port 1 View  Result Date: 12/18/2016 CLINICAL DATA:  Respiratory failure EXAM: PORTABLE CHEST 1 VIEW COMPARISON:  Nineteen 2018 FINDINGS: Endotracheal tube 3.6 cm above the carina. NG tube within the stomach with the tip not visualized. Persistent dense left lower lobe collapse/ consolidation. Posterior layering effusions as before. No significant interval change. No pneumothorax. Trachea is midline. Aorta is atherosclerotic. No current edema. IMPRESSION: Stable support apparatus. Persistent dense left lower lobe collapse / consolidation and bilateral pleural effusions. No significant interval change. Electronically Signed   By: Jerilynn Mages.  Shick M.D.   On: 12/18/2016 09:15   Dg Chest Port 1 View  Result Date: 12/17/2016 CLINICAL DATA:  Intubation, CHF, renal failure EXAM: PORTABLE CHEST 1 VIEW COMPARISON:  12/25/2016 FINDINGS: Endotracheal tube is 2.6 cm above the trachea. NG tube enters the stomach with the tip not visualized. Stable cardiomegaly with improvement in the pulmonary edema pattern compared to yesterday. Posterior layering pleural effusions noted bilaterally. No pneumothorax. IMPRESSION: Improving CHF  pattern. Electronically Signed   By: Jerilynn Mages.  Shick M.D.   On: 12/17/2016 10:13   Dg Chest Portable 1 View  Result Date: 12/28/2016 CLINICAL DATA:  Intubation and orogastric tube placement. Follow-up pulmonary edema. EXAM: PORTABLE CHEST 1 VIEW 8:24 a.m.: COMPARISON:  Portable chest x-ray earlier today 7:16 a.m., 12/10/2016 and earlier. FINDINGS: Endotracheal tube tip in satisfactory position approximately 5 cm above the carina. OG tube courses below the diaphragm into the stomach, faintly visible. External pacing pads are present. Cardiac silhouette mildly to moderately enlarged. Interval further worsening of interstitial and airspace pulmonary edema throughout both lungs, asymmetric increased on the right, since earlier this morning. Small bilateral pleural effusions. IMPRESSION: 1. Endotracheal tube tip in satisfactory position approximately 5 cm above carina. 2. OG tube courses below the diaphragm into the stomach. 3. Further worsening of interstitial and airspace pulmonary edema, asymmetric and increased on the right. 4. Small bilateral pleural effusions. Electronically Signed   By: Evangeline Dakin M.D.   On: 01/01/2017 09:02   Dg Chest Portable 1 View  Result Date: 01/12/2017 CLINICAL DATA:  Extra distress. EXAM: PORTABLE CHEST 1 VIEW COMPARISON:  12/10/2016 FINDINGS: Stable enlarged cardiac silhouette. There is diffuse airspace pattern suggesting combination of interstitial and pulmonary edema. No pneumothorax. No focal consolidation. IMPRESSION: Interstitial and mild pulmonary edema. Electronically Signed   By: Suzy Bouchard M.D.   On: 12/29/2016 08:04   Dg Chest Port 1 View  Result Date: 12/10/2016 CLINICAL DATA:  Dyspnea.  Cardiac arrest 2 days ago. EXAM: PORTABLE CHEST 1 VIEW COMPARISON:  12/08/2016 FINDINGS: Heart size remains within normal limits allowing for portable and lordotic positioning. Low lung volumes are noted, however there is no evidence of pulmonary infiltrate or definite pleural  effusion. IMPRESSION: No active disease. Electronically Signed   By: Earle Gell M.D.   On: 12/10/2016 09:40   Dg Chest Port 1 View  Result Date: 12/08/2016 CLINICAL DATA:  Status post cardiac arrest EXAM: PORTABLE CHEST 1 VIEW COMPARISON:  12/07/2016 FINDINGS: There is mild bilateral interstitial prominence. There is no focal parenchymal opacity. There is no pleural effusion or pneumothorax. There is stable cardiomegaly. The osseous structures are unremarkable. IMPRESSION: Cardiomegaly with mild pulmonary vascular congestion. Electronically Signed   By: Kathreen Devoid   On: 12/08/2016 14:36   Dg Chest Port 1 View  Result Date: 12/07/2016 CLINICAL DATA:  Patient with sudden onset centralized chest pain. Shortness of breath. EXAM: PORTABLE CHEST 1 VIEW COMPARISON:  Chest radiograph 12/07/2016. FINDINGS: Pacer apparatus overlies the left hemithorax. Stable cardiomegaly. Bilateral perihilar interstitial pulmonary opacities. No pleural effusion or pneumothorax. IMPRESSION: Cardiomegaly with bilateral interstitial opacities favored to represent pulmonary edema, similar to prior. Electronically Signed   By: Lovey Newcomer M.D.   On: 12/07/2016 18:48   Dg Chest Port 1 View  Result Date: 11/30/2016 CLINICAL DATA:  Acute onset of shortness of breath and generalized chest pressure. Initial encounter. EXAM: PORTABLE CHEST 1 VIEW COMPARISON:  Chest radiograph performed 11/28/2016 FINDINGS: The lungs are well-aerated. Vascular congestion is noted. Increased interstitial markings raise concern for mild interstitial edema. There is no evidence of pleural effusion or pneumothorax. The cardiomediastinal silhouette is borderline normal in size. No acute osseous abnormalities are seen. IMPRESSION: Vascular congestion. Increased interstitial markings raise concern for mild interstitial edema. Electronically Signed   By: Garald Balding M.D.   On: 11/30/2016 05:00   Dg Abd Portable 1v  Result Date: 01/08/2017 CLINICAL DATA:   Repositioning of previously placed nasogastric tube at the patient bedside. EXAM: PORTABLE ABDOMEN - 1 VIEW 11:13  a.m.: COMPARISON:  Portable abdomen x-ray earlier today 8:24 a.m. FINDINGS: Nasogastric tube tip now projects over the expected location of the gastric antrum. Visualized upper abdominal bowel gas pattern unremarkable. Extensive splenic artery calcification and calcified granulomata in the spleen are noted. IMPRESSION: Nasogastric tube tip now appropriately positioned in the gastric antrum. Electronically Signed   By: Evangeline Dakin M.D.   On: 12/20/2016 13:23   Dg Abd Portable 1 View  Result Date: 01/10/2017 CLINICAL DATA:  OG tube placement. EXAM: PORTABLE ABDOMEN - 1 VIEW COMPARISON:  Portable chest x-ray obtained concurrently. FINDINGS: Motion blurs the image. The OG tube is difficult to visualize due to the motion and the technique. I believe the to the tip is at the level of the gastric cardia. External pacing pads. Visualized upper abdominal bowel gas pattern unremarkable. IMPRESSION: Motion degraded image shows the OG tube tip at the level of the gastric cardia. This should be advanced. Electronically Signed   By: Evangeline Dakin M.D.   On: 12/30/2016 09:04    Microbiology Recent Results (from the past 240 hour(s))  Blood culture (routine x 2)     Status: None   Collection Time: 01/03/2017  7:24 AM  Result Value Ref Range Status   Specimen Description BLOOD RIGHT ANTECUBITAL  Final   Special Requests   Final    BOTTLES DRAWN AEROBIC AND ANAEROBIC Blood Culture adequate volume   Culture NO GROWTH 5 DAYS  Final   Report Status 12/21/2016 FINAL  Final  Blood culture (routine x 2)     Status: None   Collection Time: 01/02/2017  7:29 AM  Result Value Ref Range Status   Specimen Description BLOOD LEFT ANTECUBITAL  Final   Special Requests   Final    BOTTLES DRAWN AEROBIC AND ANAEROBIC Blood Culture adequate volume   Culture NO GROWTH 5 DAYS  Final   Report Status 12/21/2016 FINAL   Final  Culture, respiratory (NON-Expectorated)     Status: None   Collection Time: 12/18/16 10:00 AM  Result Value Ref Range Status   Specimen Description TRACHEAL ASPIRATE  Final   Special Requests NONE  Final   Gram Stain   Final    ABUNDANT WBC PRESENT,BOTH PMN AND MONONUCLEAR NO ORGANISMS SEEN    Culture   Final    NO GROWTH 2 DAYS Performed at [facility], [address], [place], [place] [address]    Report Status 12/21/2016 FINAL  Final  CULTURE, BLOOD (ROUTINE X 2) w Reflex to ID Panel     Status: None (Preliminary result)   Collection Time: 12/22/16 11:11 AM  Result Value Ref Range Status   Specimen Description BLOOD BLOOD RIGHT HAND  Final   Special Requests   Final    BOTTLES DRAWN AEROBIC AND ANAEROBIC Blood Culture adequate volume   Culture NO GROWTH 3 DAYS  Final   Report Status PENDING  Incomplete  CULTURE, BLOOD (ROUTINE X 2) w Reflex to ID Panel     Status: None (Preliminary result)   Collection Time: 12/22/16 11:31 AM  Result Value Ref Range Status   Specimen Description BLOOD BLOOD LEFT HAND  Final   Special Requests   Final    BOTTLES DRAWN AEROBIC AND ANAEROBIC Blood Culture adequate volume   Culture NO GROWTH 3 DAYS  Final   Report Status PENDING  Incomplete  Culture, respiratory (NON-Expectorated)     Status: None   Collection Time: 12/22/16 11:50 AM  Result Value Ref Range Status  Specimen Description TRACHEAL ASPIRATE  Final   Special Requests NONE  Final   Gram Stain   Final    MODERATE WBC PRESENT,BOTH PMN AND MONONUCLEAR NO ORGANISMS SEEN    Culture   Final    Consistent with normal respiratory flora. Performed at [facility], [place] [address], [place], [place] [address]    Report Status 12/24/2016 FINAL  Final  Urine Culture     Status: None   Collection Time: 12/22/16  4:28 PM  Result Value Ref Range Status   Specimen Description URINE, RANDOM  Final   Special Requests NONE  Final   Culture   Final    NO GROWTH Performed  at [facility], [place] [address] [place],  [address]    Report Status 12/24/2016 FINAL  Final    Lab Basic Metabolic Panel:  Recent Labs Lab 12/20/16 0417  12/22/16 0502 12/22/16 2312 12/23/16 0439 12/24/16 0225 12/25/16 0421  NA 140  < > 141 142 142 142 141  K 4.1  < > 4.4 4.1 4.8 4.4 4.5  CL 100*  < > 98* 99* 99* 99* 97*  CO2 31  < > 31 32 32 32 33*  GLUCOSE 219*  < > 249* 182* 242* 175* 263*  BUN 41*  < > 62* 80* 89* 92* 126*  CREATININE 1.44*  < > 2.04* 1.84* 1.77* 1.66* 2.24*  CALCIUM 9.0  < > 9.1 9.2 9.0 8.6* 8.7*  MG  --   --   --  2.5* 2.4 2.5* 3.0*  PHOS 3.8  --   --  3.7 4.4  4.5 4.0  --   < > = values in this interval not displayed. Liver Function Tests:  Recent Labs Lab 12/20/16 0417 12/23/16 0439  ALBUMIN 2.5* 2.3*   No results for input(s): LIPASE, AMYLASE in the last 168 hours. No results for input(s): AMMONIA in the last 168 hours. CBC:  Recent Labs Lab 12/22/16 2312 12/23/16 0439  WBC 19.6* 18.7*  HGB 9.0* 8.8*  HCT 27.4* 27.0*  MCV 84.1 87.2  PLT 539* 508*   Cardiac Enzymes: No results for input(s): CKTOTAL, CKMB, CKMBINDEX, TROPONINI in the last 168 hours. Sepsis Labs:  Recent Labs Lab 12/19/16 0522 12/22/16 1121 12/22/16 2312 12/23/16 0439 12/24/16 0225  PROCALCITON 0.38 0.35  --  0.37 0.39  WBC  --   --  19.6* 18.7*  --     Procedures/Operations  Endotracheal Tube Placement 12/16/16 Right Internal Jugular Central Line 12/18/2016  Marda Stalker, AGNP  Pulmonary/Critical Care Pager [phone] (please enter 7 digits) PCCM Consult Pager [phone] (please enter 7 digits)

## 2017-01-15 NOTE — Patient Outreach (Signed)
View in [place], pt expired today while in patient.     Plan:  Case closure letter to be sent to Dr. Clayborn Bigness.             Plan to inform [facility] care management assistant to close case.             Programs ended.    Zara Chess.   [place] [facility]  [phone]

## 2017-01-15 DEATH — deceased

## 2017-08-06 ENCOUNTER — Other Ambulatory Visit: Payer: Self-pay

## 2017-08-09 ENCOUNTER — Ambulatory Visit: Payer: Self-pay | Admitting: Urology

## 2017-09-24 ENCOUNTER — Ambulatory Visit (INDEPENDENT_AMBULATORY_CARE_PROVIDER_SITE_OTHER): Payer: Self-pay | Admitting: Vascular Surgery

## 2018-05-09 IMAGING — DX DG CHEST 1V PORT
1 series · 1 of 1 positions shown · non-contrast
Comparison: 12/08/2016

CLINICAL DATA: Dyspnea.  Cardiac arrest 2 days ago.

EXAM:
PORTABLE CHEST 1 VIEW

[chest ap]
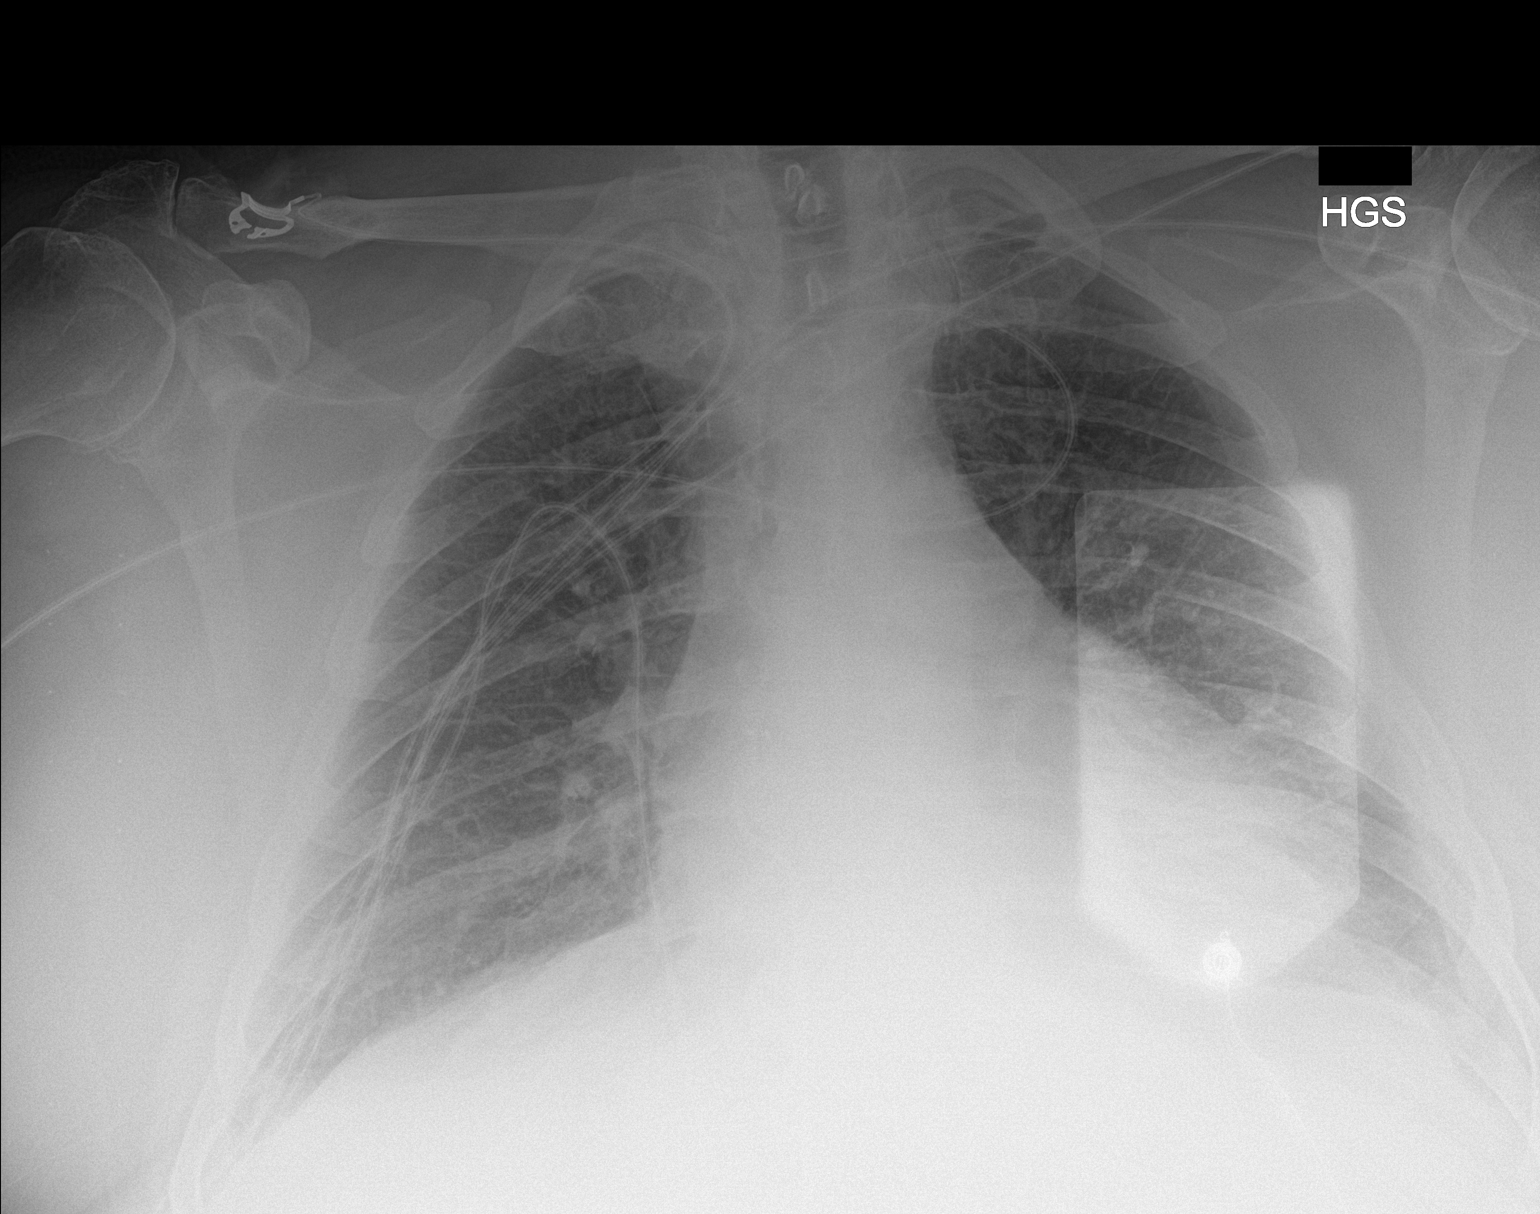

[1 of 1 positions shown; findings below may reference images not displayed]

FINDINGS: Heart size remains within normal limits allowing for portable and
lordotic positioning. Low lung volumes are noted, however there is
no evidence of pulmonary infiltrate or definite pleural effusion.
IMPRESSION: No active disease.

## 2018-05-16 IMAGING — DX DG CHEST 1V PORT
1 series · 1 of 1 positions shown · non-contrast
Comparison: 12/16/2016

CLINICAL DATA: Intubation, CHF, renal failure

EXAM:
PORTABLE CHEST 1 VIEW

[chest ap]
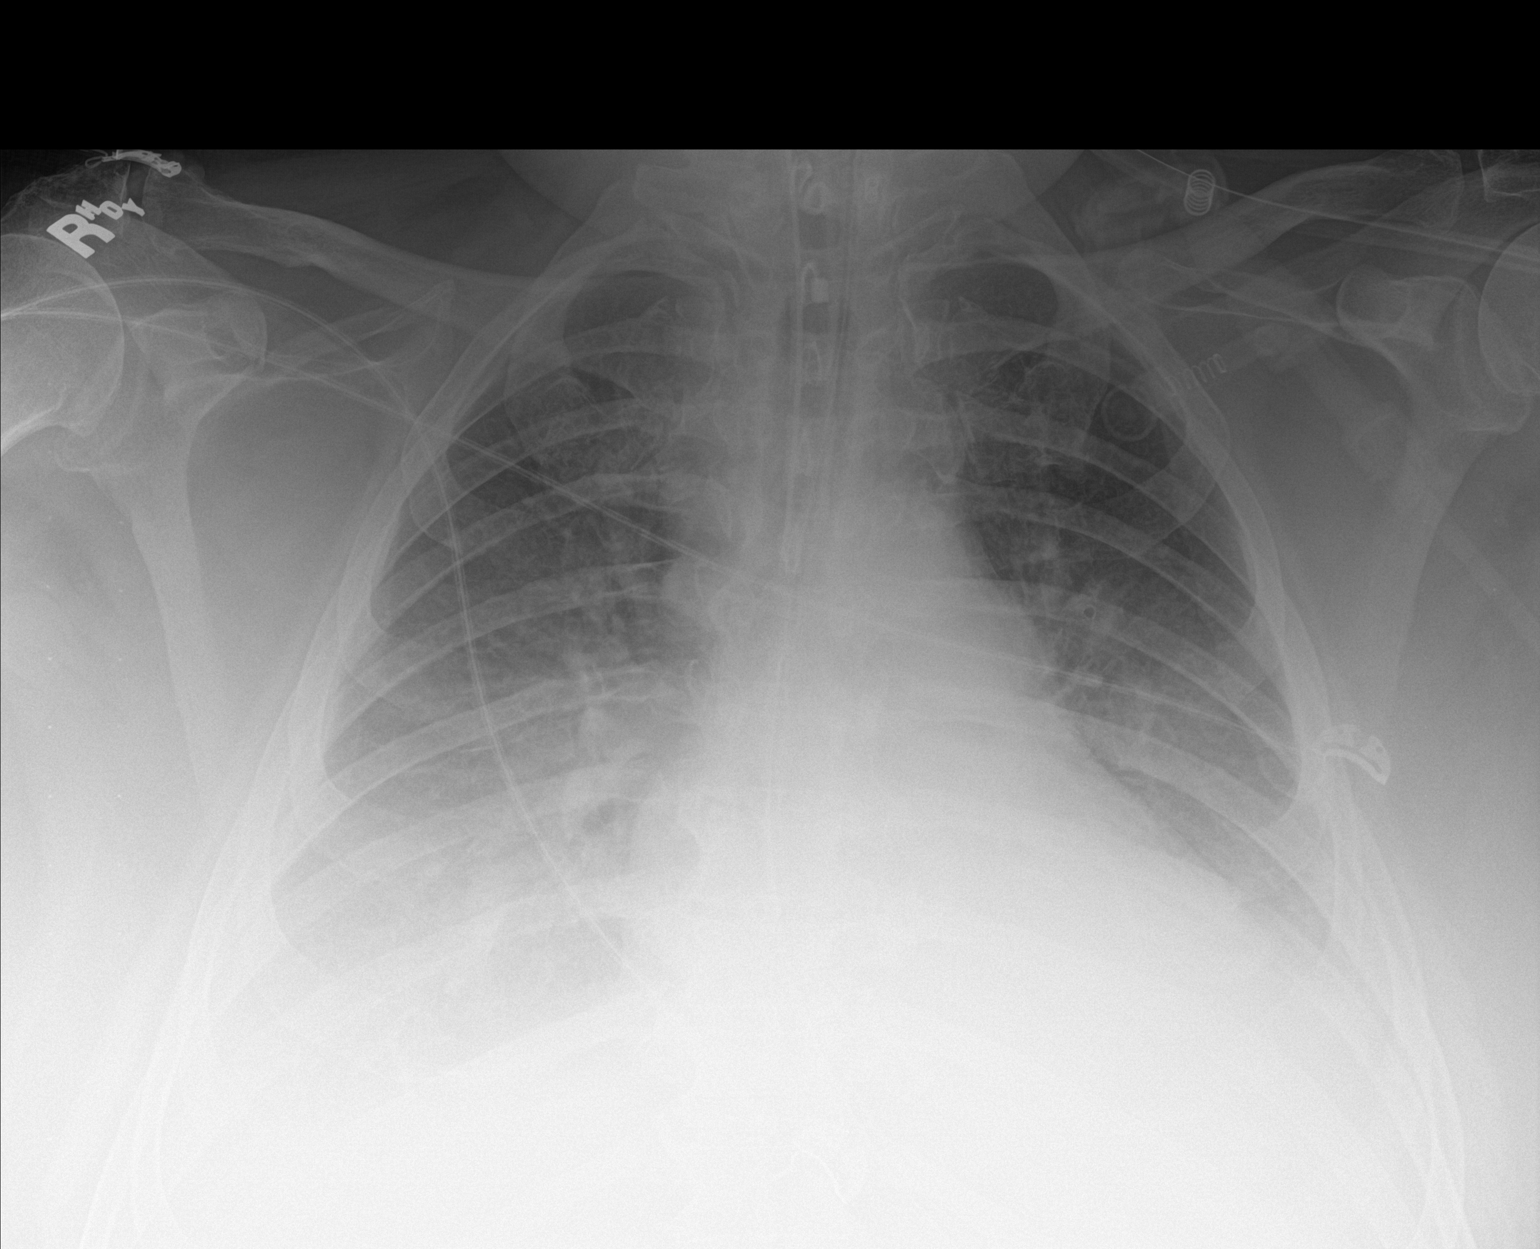

[1 of 1 positions shown; findings below may reference images not displayed]

FINDINGS: Endotracheal tube is 2.6 cm above the trachea. NG tube enters the
stomach with the tip not visualized.

Stable cardiomegaly with improvement in the pulmonary edema pattern
compared to yesterday. Posterior layering pleural effusions noted
bilaterally. No pneumothorax.
IMPRESSION: Improving CHF pattern.

## 2018-05-17 IMAGING — DX DG ABDOMEN 1V
1 series · 1 of 1 positions shown · non-contrast
Comparison: 12/16/2016

CLINICAL DATA: OG tube

EXAM:
ABDOMEN - 1 VIEW

[abdomen kub]
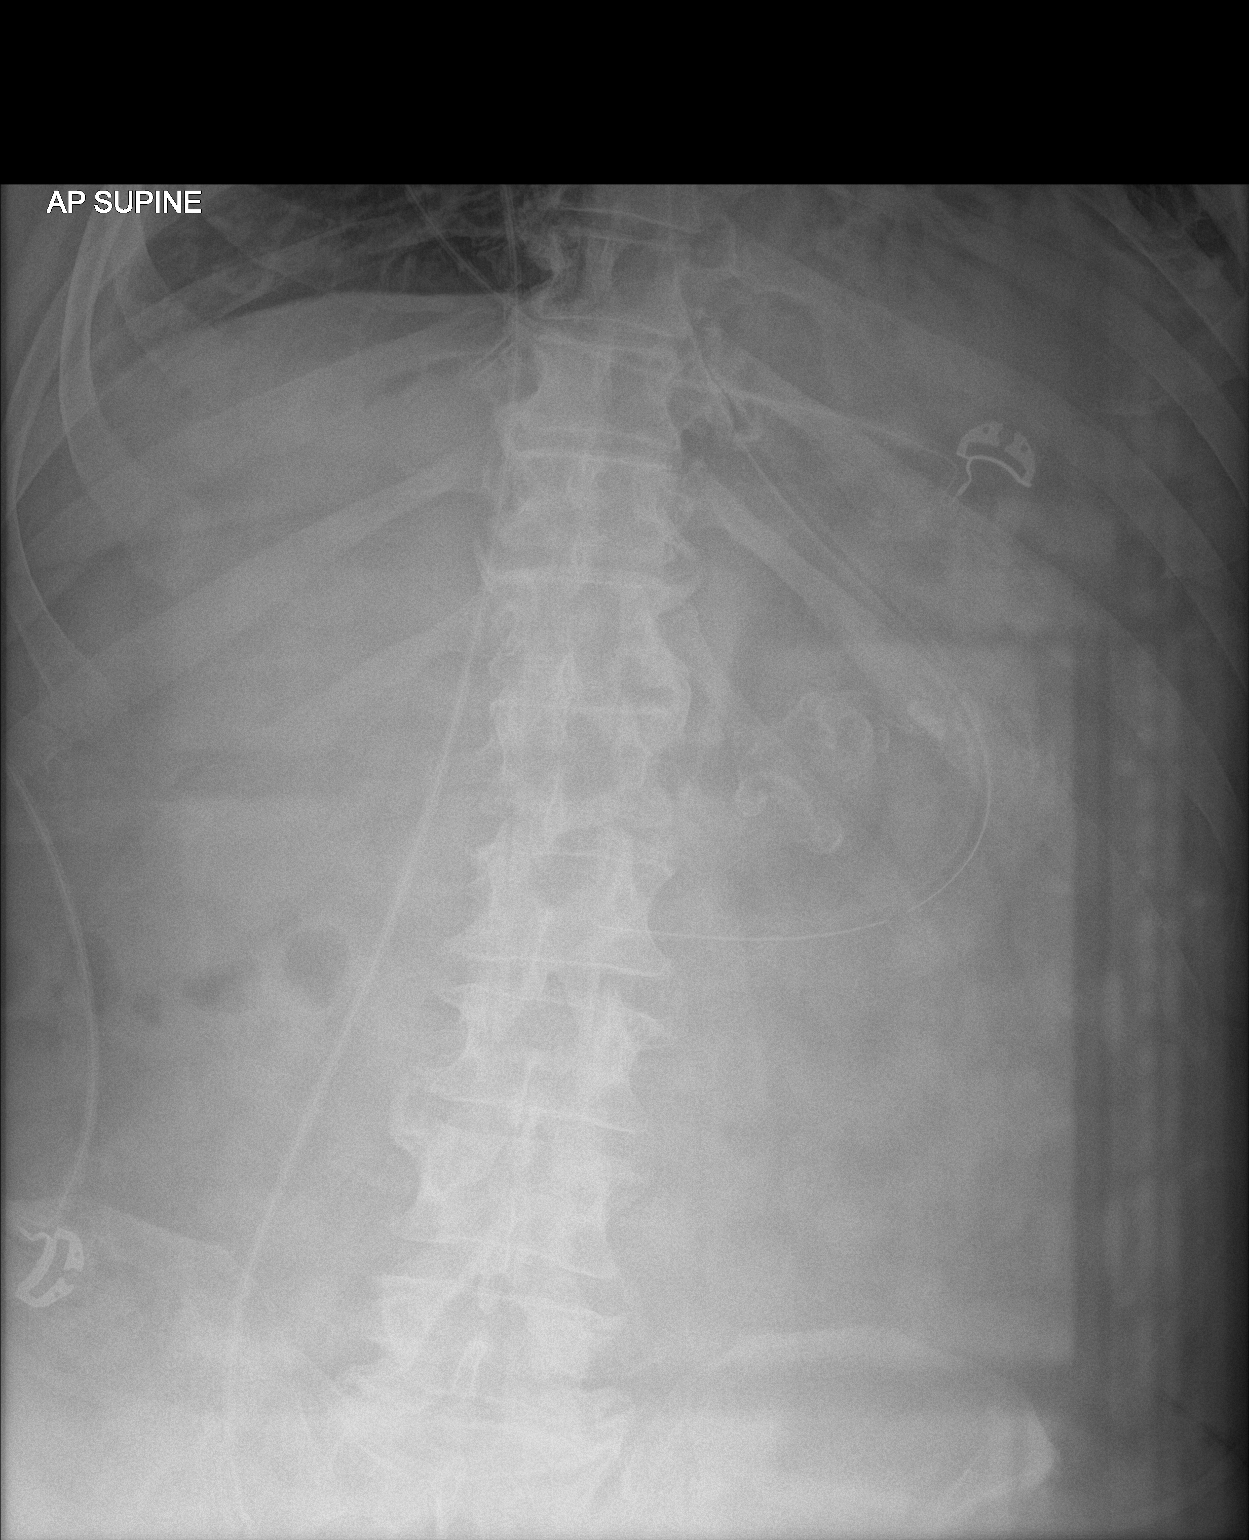

[1 of 1 positions shown; findings below may reference images not displayed]

FINDINGS: OG tube within the distal stomach as before. Nonobstructive bowel
gas pattern. Abdominal atherosclerosis noted.

Dense left lower lobe collapse/ consolidation.

Degenerative changes of the spine.
IMPRESSION: OG tube within the distal stomach.

No significant obstruction pattern or ileus

Dense left lower lobe collapse/consolidation

## 2018-05-17 IMAGING — DX DG CHEST 1V PORT
1 series · 1 of 1 positions shown · non-contrast
Comparison: Nineteen 1568

CLINICAL DATA: Respiratory failure

EXAM:
PORTABLE CHEST 1 VIEW

[chest ap]
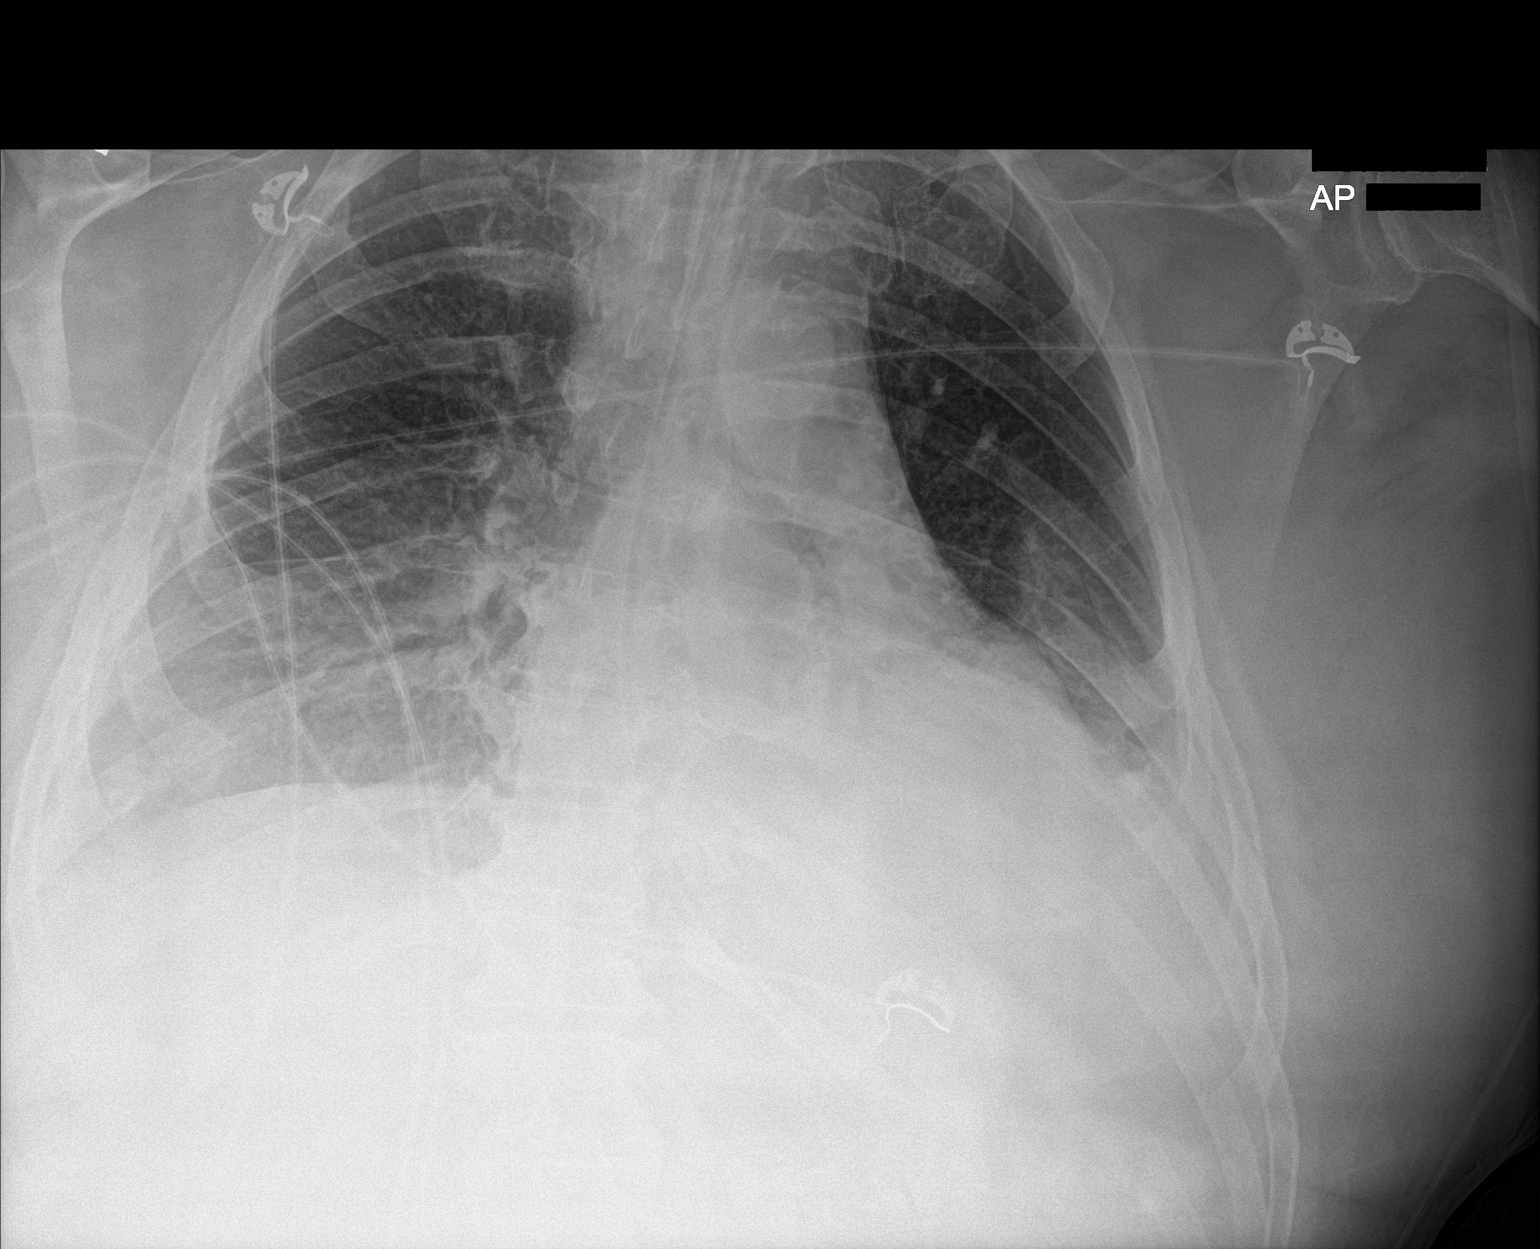

[1 of 1 positions shown; findings below may reference images not displayed]

FINDINGS: Endotracheal tube 3.6 cm above the carina. NG tube within the
stomach with the tip not visualized.

Persistent dense left lower lobe collapse/ consolidation. Posterior
layering effusions as before. No significant interval change. No
pneumothorax. Trachea is midline. Aorta is atherosclerotic. No
current edema.
IMPRESSION: Stable support apparatus.

Persistent dense left lower lobe collapse / consolidation and
bilateral pleural effusions. No significant interval change.

## 2018-05-23 IMAGING — DX DG CHEST 1V PORT
1 series · 3 of 3 positions shown · non-contrast
Comparison: 12/22/2016

CLINICAL DATA: Acute respiratory failure

EXAM:
PORTABLE CHEST 1 VIEW

[Series 1: chest ap · 0.14mm/px · 3 of 3 slices shown]
[im 1/3]
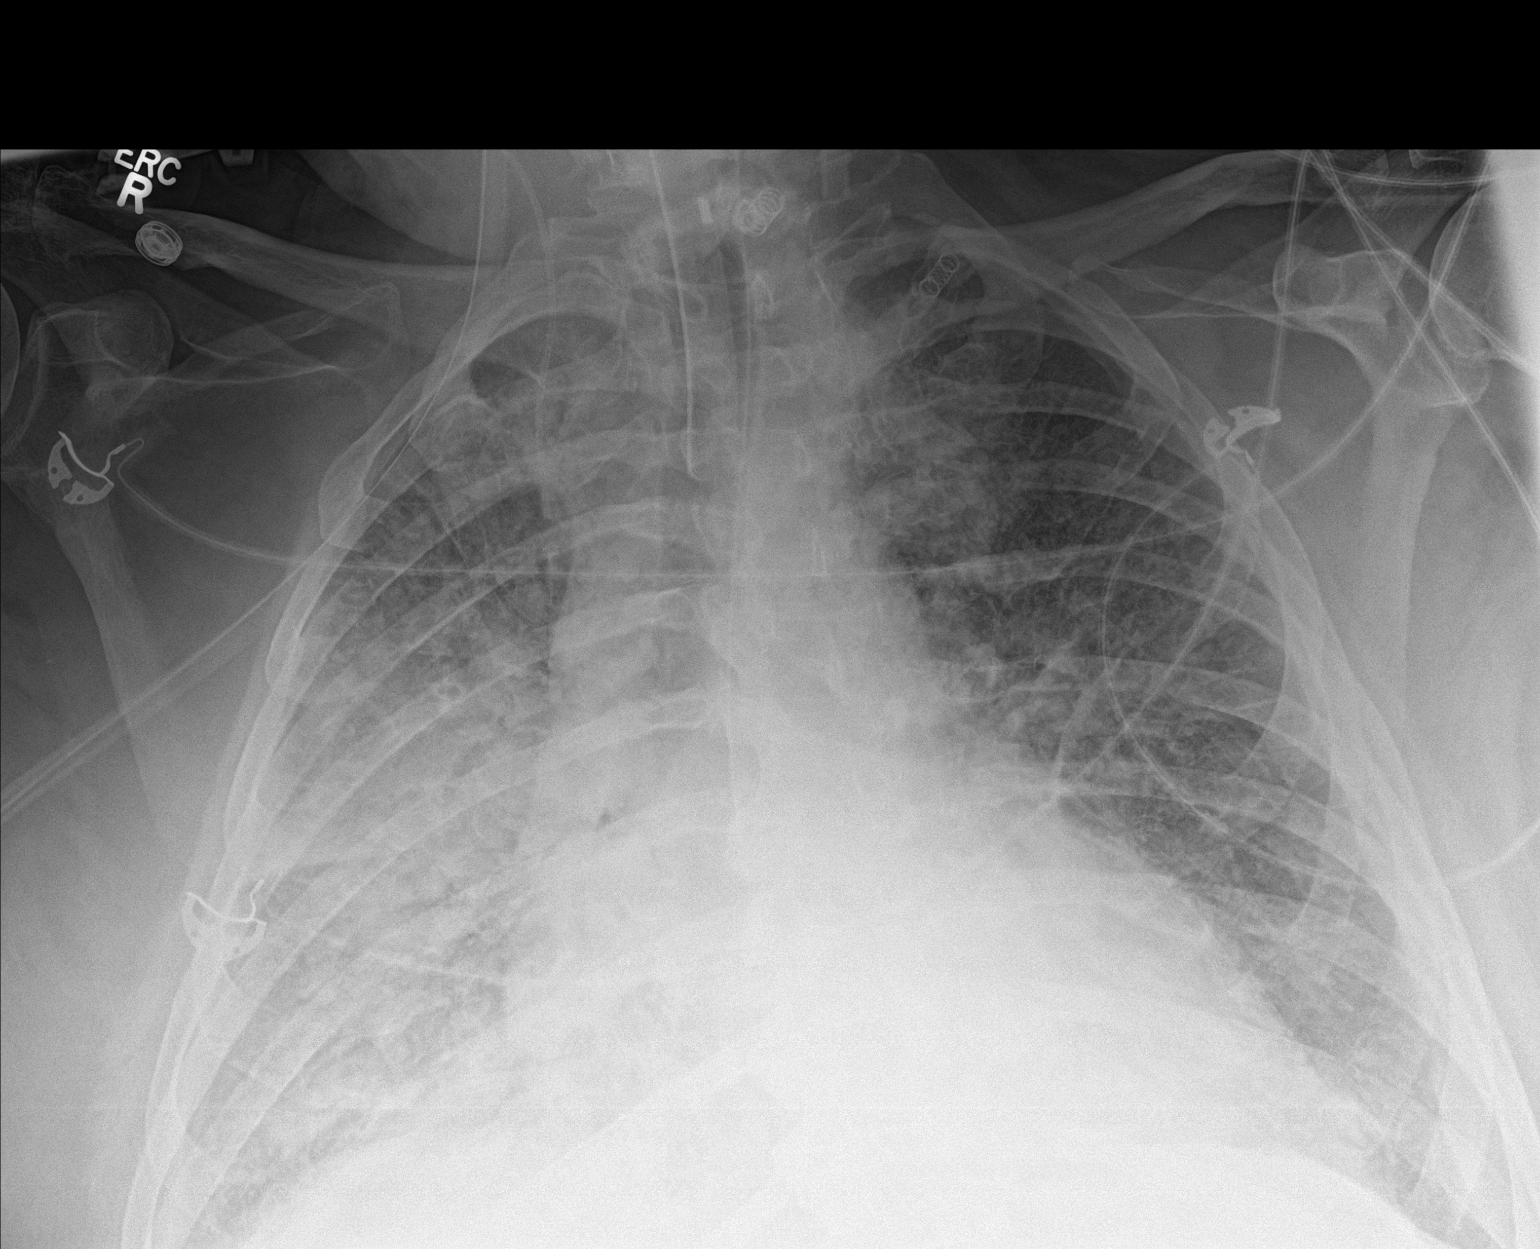
[im 2/3]
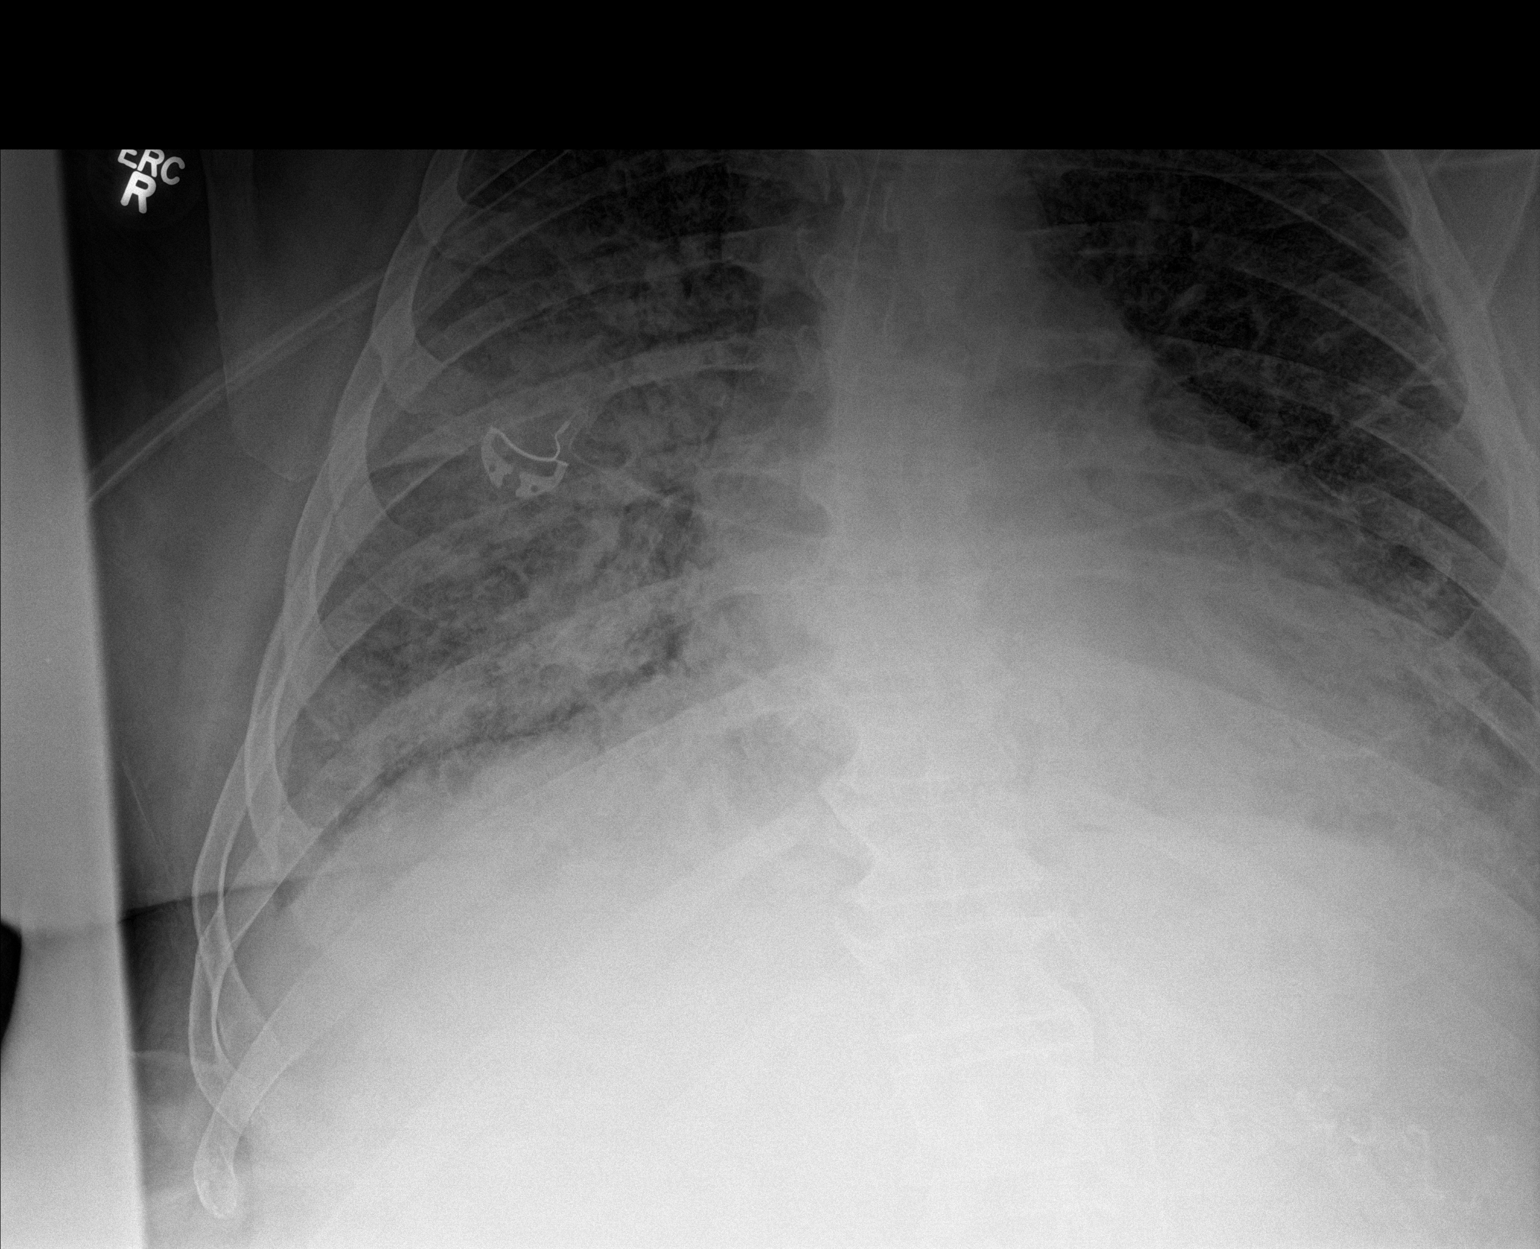
[im 3/3]
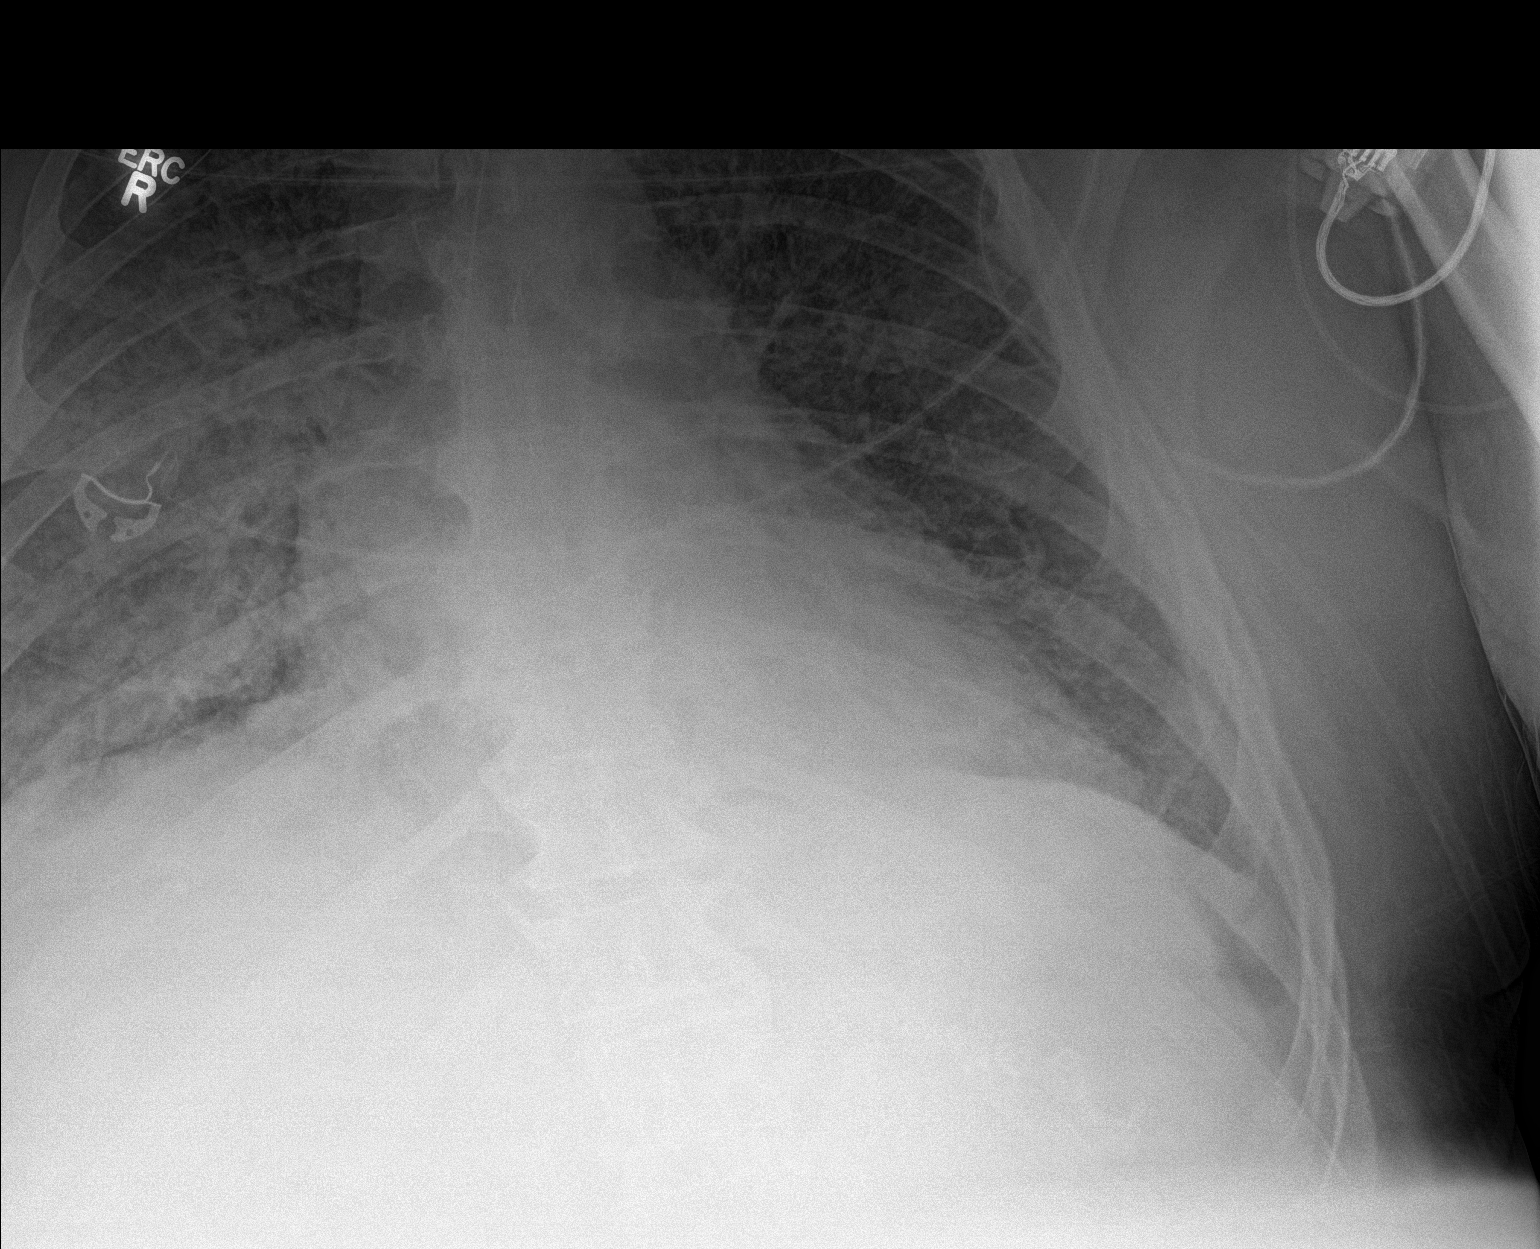

[3 of 3 positions shown; findings below may reference images not displayed]

FINDINGS: Endotracheal tube with tip measuring 5.6 cm above the carina. Right
central venous catheter with tip over the upper SVC region. Enteric
tube tip is not visualized below the diaphragms. This is likely due
to technique. Cardiac enlargement with pulmonary vascular
congestion. Bilateral perihilar airspace and interstitial
infiltrates. This likely represents edema but could indicate
aspiration, pneumonia, or ARDS in the appropriate setting. No
pneumothorax. Mild blunting of costophrenic angles suggesting small
effusions.
IMPRESSION: Appliances appear in satisfactory position. Cardiac enlargement with
pulmonary vascular congestion and perihilar infiltrates consistent
with edema, aspiration, pneumonia, or ARDS, depending on the
clinical setting. Small bilateral pleural effusions.
